# Patient Record
Sex: Female | Born: 1935 | Race: White | Hispanic: No | Smoking: Former smoker
Health system: Southern US, Community
[De-identification: ages and names within clinical notes are randomized; demographics above are authoritative.]

## PROBLEM LIST (undated history)

## (undated) DIAGNOSIS — I4891 Unspecified atrial fibrillation: Secondary | ICD-10-CM

## (undated) DIAGNOSIS — I6350 Cerebral infarction due to unspecified occlusion or stenosis of unspecified cerebral artery: Secondary | ICD-10-CM

## (undated) DIAGNOSIS — Z8719 Personal history of other diseases of the digestive system: Secondary | ICD-10-CM

## (undated) DIAGNOSIS — T4145XA Adverse effect of unspecified anesthetic, initial encounter: Secondary | ICD-10-CM

## (undated) DIAGNOSIS — F22 Delusional disorders: Secondary | ICD-10-CM

## (undated) DIAGNOSIS — M5137 Other intervertebral disc degeneration, lumbosacral region: Secondary | ICD-10-CM

## (undated) DIAGNOSIS — IMO0002 Reserved for concepts with insufficient information to code with codable children: Secondary | ICD-10-CM

## (undated) DIAGNOSIS — R0609 Other forms of dyspnea: Secondary | ICD-10-CM

## (undated) DIAGNOSIS — M51379 Other intervertebral disc degeneration, lumbosacral region without mention of lumbar back pain or lower extremity pain: Secondary | ICD-10-CM

## (undated) DIAGNOSIS — K921 Melena: Secondary | ICD-10-CM

## (undated) DIAGNOSIS — J984 Other disorders of lung: Secondary | ICD-10-CM

## (undated) DIAGNOSIS — F411 Generalized anxiety disorder: Secondary | ICD-10-CM

## (undated) DIAGNOSIS — R0989 Other specified symptoms and signs involving the circulatory and respiratory systems: Secondary | ICD-10-CM

## (undated) DIAGNOSIS — I10 Essential (primary) hypertension: Secondary | ICD-10-CM

## (undated) DIAGNOSIS — Z8711 Personal history of peptic ulcer disease: Secondary | ICD-10-CM

## (undated) DIAGNOSIS — K219 Gastro-esophageal reflux disease without esophagitis: Secondary | ICD-10-CM

## (undated) DIAGNOSIS — T7840XA Allergy, unspecified, initial encounter: Secondary | ICD-10-CM

## (undated) DIAGNOSIS — T8859XA Other complications of anesthesia, initial encounter: Secondary | ICD-10-CM

## (undated) DIAGNOSIS — D649 Anemia, unspecified: Secondary | ICD-10-CM

## (undated) DIAGNOSIS — K922 Gastrointestinal hemorrhage, unspecified: Secondary | ICD-10-CM

## (undated) DIAGNOSIS — T50995A Adverse effect of other drugs, medicaments and biological substances, initial encounter: Secondary | ICD-10-CM

## (undated) DIAGNOSIS — M1710 Unilateral primary osteoarthritis, unspecified knee: Secondary | ICD-10-CM

## (undated) HISTORY — DX: Personal history of other diseases of the digestive system: Z87.19

## (undated) HISTORY — DX: Cerebral infarction due to unspecified occlusion or stenosis of unspecified cerebral artery: I63.50

## (undated) HISTORY — DX: Generalized anxiety disorder: F41.1

## (undated) HISTORY — DX: Melena: K92.1

## (undated) HISTORY — DX: Other forms of dyspnea: R06.09

## (undated) HISTORY — DX: Unilateral primary osteoarthritis, unspecified knee: M17.10

## (undated) HISTORY — DX: Delusional disorders: F22

## (undated) HISTORY — DX: Essential (primary) hypertension: I10

## (undated) HISTORY — DX: Allergy, unspecified, initial encounter: T78.40XA

## (undated) HISTORY — PX: SHOULDER ARTHROSCOPY: SHX128

## (undated) HISTORY — DX: Reserved for concepts with insufficient information to code with codable children: IMO0002

## (undated) HISTORY — DX: Gastrointestinal hemorrhage, unspecified: K92.2

## (undated) HISTORY — PX: DILATION AND CURETTAGE OF UTERUS: SHX78

## (undated) HISTORY — DX: Other intervertebral disc degeneration, lumbosacral region without mention of lumbar back pain or lower extremity pain: M51.379

## (undated) HISTORY — DX: Other intervertebral disc degeneration, lumbosacral region: M51.37

## (undated) HISTORY — DX: Other disorders of lung: J98.4

## (undated) HISTORY — DX: Adverse effect of other drugs, medicaments and biological substances, initial encounter: T50.995A

## (undated) HISTORY — DX: Other specified symptoms and signs involving the circulatory and respiratory systems: R09.89

## (undated) HISTORY — DX: Anemia, unspecified: D64.9

## (undated) HISTORY — DX: Personal history of peptic ulcer disease: Z87.11

---
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
## 1998-07-27 ENCOUNTER — Ambulatory Visit (HOSPITAL_COMMUNITY): Admission: RE | Admit: 1998-07-27 | Discharge: 1998-07-27 | Payer: Self-pay | Admitting: Internal Medicine

## 1998-07-27 ENCOUNTER — Encounter: Payer: Self-pay | Admitting: Internal Medicine

## 1999-01-12 ENCOUNTER — Emergency Department (HOSPITAL_COMMUNITY): Admission: EM | Admit: 1999-01-12 | Discharge: 1999-01-12 | Payer: Self-pay

## 2000-01-11 ENCOUNTER — Ambulatory Visit (HOSPITAL_COMMUNITY): Admission: RE | Admit: 2000-01-11 | Discharge: 2000-01-11 | Payer: Self-pay | Admitting: *Deleted

## 2000-02-14 ENCOUNTER — Ambulatory Visit (HOSPITAL_COMMUNITY): Admission: RE | Admit: 2000-02-14 | Discharge: 2000-02-14 | Payer: Self-pay | Admitting: Orthopaedic Surgery

## 2000-03-28 ENCOUNTER — Inpatient Hospital Stay (HOSPITAL_COMMUNITY): Admission: RE | Admit: 2000-03-28 | Discharge: 2000-03-31 | Payer: Self-pay | Admitting: Internal Medicine

## 2000-03-28 ENCOUNTER — Encounter (INDEPENDENT_AMBULATORY_CARE_PROVIDER_SITE_OTHER): Payer: Self-pay | Admitting: *Deleted

## 2000-03-28 ENCOUNTER — Encounter: Payer: Self-pay | Admitting: Internal Medicine

## 2000-12-18 ENCOUNTER — Other Ambulatory Visit: Admission: RE | Admit: 2000-12-18 | Discharge: 2000-12-18 | Payer: Self-pay | Admitting: Obstetrics and Gynecology

## 2004-12-19 ENCOUNTER — Inpatient Hospital Stay (HOSPITAL_COMMUNITY): Admission: AC | Admit: 2004-12-19 | Discharge: 2004-12-22 | Payer: Self-pay | Admitting: Internal Medicine

## 2004-12-19 ENCOUNTER — Ambulatory Visit: Payer: Self-pay | Admitting: Internal Medicine

## 2004-12-21 LAB — HM COLONOSCOPY

## 2005-01-11 ENCOUNTER — Ambulatory Visit: Payer: Self-pay | Admitting: Internal Medicine

## 2005-03-01 ENCOUNTER — Encounter: Admission: RE | Admit: 2005-03-01 | Discharge: 2005-03-01 | Payer: Self-pay | Admitting: Internal Medicine

## 2005-03-03 ENCOUNTER — Ambulatory Visit: Payer: Self-pay | Admitting: Internal Medicine

## 2005-06-20 ENCOUNTER — Ambulatory Visit: Payer: Self-pay | Admitting: Internal Medicine

## 2005-12-08 ENCOUNTER — Ambulatory Visit: Payer: Self-pay | Admitting: Internal Medicine

## 2006-03-02 ENCOUNTER — Encounter (INDEPENDENT_AMBULATORY_CARE_PROVIDER_SITE_OTHER): Payer: Self-pay | Admitting: *Deleted

## 2006-07-05 ENCOUNTER — Ambulatory Visit: Payer: Self-pay | Admitting: Internal Medicine

## 2006-07-05 LAB — CONVERTED CEMR LAB
HCT: 45.6 % (ref 36.0–46.0)
Hemoglobin: 15.5 g/dL — ABNORMAL HIGH (ref 12.0–15.0)

## 2006-08-30 ENCOUNTER — Ambulatory Visit: Payer: Self-pay | Admitting: Internal Medicine

## 2006-10-12 ENCOUNTER — Ambulatory Visit: Payer: Self-pay | Admitting: Internal Medicine

## 2006-10-19 ENCOUNTER — Ambulatory Visit: Payer: Self-pay | Admitting: Internal Medicine

## 2006-10-24 ENCOUNTER — Ambulatory Visit: Payer: Self-pay | Admitting: Internal Medicine

## 2006-11-01 ENCOUNTER — Ambulatory Visit: Payer: Self-pay | Admitting: Internal Medicine

## 2006-11-22 ENCOUNTER — Ambulatory Visit: Payer: Self-pay | Admitting: Internal Medicine

## 2007-01-23 ENCOUNTER — Encounter: Payer: Self-pay | Admitting: *Deleted

## 2007-01-23 DIAGNOSIS — Z8711 Personal history of peptic ulcer disease: Secondary | ICD-10-CM

## 2007-01-23 DIAGNOSIS — M5137 Other intervertebral disc degeneration, lumbosacral region: Secondary | ICD-10-CM

## 2007-01-23 DIAGNOSIS — K922 Gastrointestinal hemorrhage, unspecified: Secondary | ICD-10-CM | POA: Insufficient documentation

## 2007-01-23 DIAGNOSIS — M1990 Unspecified osteoarthritis, unspecified site: Secondary | ICD-10-CM

## 2007-01-23 DIAGNOSIS — Z8719 Personal history of other diseases of the digestive system: Secondary | ICD-10-CM

## 2007-01-23 DIAGNOSIS — F419 Anxiety disorder, unspecified: Secondary | ICD-10-CM

## 2007-01-23 DIAGNOSIS — I110 Hypertensive heart disease with heart failure: Secondary | ICD-10-CM

## 2007-01-25 ENCOUNTER — Encounter: Payer: Self-pay | Admitting: Internal Medicine

## 2007-01-25 ENCOUNTER — Ambulatory Visit: Payer: Self-pay | Admitting: Internal Medicine

## 2007-05-04 ENCOUNTER — Telehealth: Payer: Self-pay | Admitting: Internal Medicine

## 2007-06-01 ENCOUNTER — Ambulatory Visit: Payer: Self-pay | Admitting: Internal Medicine

## 2007-06-01 DIAGNOSIS — F22 Delusional disorders: Secondary | ICD-10-CM | POA: Insufficient documentation

## 2007-06-18 ENCOUNTER — Telehealth: Payer: Self-pay | Admitting: Internal Medicine

## 2007-07-18 ENCOUNTER — Ambulatory Visit: Payer: Self-pay | Admitting: Internal Medicine

## 2007-07-18 DIAGNOSIS — K921 Melena: Secondary | ICD-10-CM | POA: Insufficient documentation

## 2007-07-18 LAB — CONVERTED CEMR LAB
Basophils Absolute: 0.1 10*3/uL (ref 0.0–0.1)
Eosinophils Absolute: 0 10*3/uL (ref 0.0–0.6)
Hemoglobin: 14.4 g/dL (ref 12.0–15.0)
Lymphocytes Relative: 28.4 % (ref 12.0–46.0)
MCHC: 33.5 g/dL (ref 30.0–36.0)
Monocytes Absolute: 0.5 10*3/uL (ref 0.2–0.7)
Monocytes Relative: 7.7 % (ref 3.0–11.0)
Neutro Abs: 4.3 10*3/uL (ref 1.4–7.7)
Platelets: 222 10*3/uL (ref 150–400)

## 2007-07-19 ENCOUNTER — Telehealth: Payer: Self-pay | Admitting: Internal Medicine

## 2007-08-16 ENCOUNTER — Encounter: Payer: Self-pay | Admitting: Internal Medicine

## 2007-10-04 ENCOUNTER — Telehealth: Payer: Self-pay | Admitting: Internal Medicine

## 2007-11-05 ENCOUNTER — Telehealth: Payer: Self-pay | Admitting: Internal Medicine

## 2007-11-09 ENCOUNTER — Ambulatory Visit: Payer: Self-pay | Admitting: Internal Medicine

## 2007-11-09 DIAGNOSIS — T50995A Adverse effect of other drugs, medicaments and biological substances, initial encounter: Secondary | ICD-10-CM

## 2007-11-29 ENCOUNTER — Telehealth: Payer: Self-pay | Admitting: Internal Medicine

## 2007-11-30 ENCOUNTER — Telehealth: Payer: Self-pay | Admitting: Internal Medicine

## 2007-12-07 ENCOUNTER — Telehealth: Payer: Self-pay | Admitting: Internal Medicine

## 2007-12-18 ENCOUNTER — Telehealth: Payer: Self-pay | Admitting: Internal Medicine

## 2007-12-21 ENCOUNTER — Telehealth: Payer: Self-pay | Admitting: Internal Medicine

## 2008-01-02 ENCOUNTER — Telehealth: Payer: Self-pay | Admitting: Internal Medicine

## 2008-01-08 ENCOUNTER — Ambulatory Visit: Payer: Self-pay | Admitting: Internal Medicine

## 2008-01-08 ENCOUNTER — Telehealth: Payer: Self-pay | Admitting: Internal Medicine

## 2008-01-08 DIAGNOSIS — D649 Anemia, unspecified: Secondary | ICD-10-CM

## 2008-01-08 LAB — CONVERTED CEMR LAB
Eosinophils Absolute: 0 10*3/uL (ref 0.0–0.7)
Eosinophils Relative: 0.3 % (ref 0.0–5.0)
Lymphocytes Relative: 14 % (ref 12.0–46.0)
Monocytes Relative: 7.4 % (ref 3.0–12.0)
Neutrophils Relative %: 77.8 % — ABNORMAL HIGH (ref 43.0–77.0)
Platelets: 255 10*3/uL (ref 150–400)
RBC: 4.21 M/uL (ref 3.87–5.11)
WBC: 10.3 10*3/uL (ref 4.5–10.5)

## 2008-01-11 ENCOUNTER — Telehealth: Payer: Self-pay | Admitting: Internal Medicine

## 2008-02-22 ENCOUNTER — Encounter: Payer: Self-pay | Admitting: Internal Medicine

## 2008-02-22 LAB — HM MAMMOGRAPHY: HM Mammogram: NORMAL

## 2008-02-27 ENCOUNTER — Telehealth: Payer: Self-pay | Admitting: Internal Medicine

## 2008-05-26 ENCOUNTER — Ambulatory Visit: Payer: Self-pay | Admitting: Internal Medicine

## 2008-05-26 DIAGNOSIS — J984 Other disorders of lung: Secondary | ICD-10-CM | POA: Insufficient documentation

## 2008-05-26 LAB — CONVERTED CEMR LAB
BUN: 22 mg/dL (ref 6–23)
Basophils Absolute: 0 10*3/uL (ref 0.0–0.1)
Chloride: 105 meq/L (ref 96–112)
Direct LDL: 159 mg/dL
Eosinophils Absolute: 0 10*3/uL (ref 0.0–0.7)
Eosinophils Relative: 0.5 % (ref 0.0–5.0)
GFR calc non Af Amer: 74 mL/min
HDL: 57.5 mg/dL (ref >39.0)
Lymphocytes Relative: 29.7 % (ref 12.0–46.0)
MCV: 95 fL (ref 78.0–100.0)
Neutrophils Relative %: 62.9 % (ref 43.0–77.0)
Platelets: 189 10*3/uL (ref 150–400)
Potassium: 5 meq/L (ref 3.5–5.1)
Triglycerides: 96 mg/dL (ref 0–149)
VLDL: 19 mg/dL (ref 0–40)
WBC: 6.7 10*3/uL (ref 4.5–10.5)

## 2008-05-27 ENCOUNTER — Encounter: Payer: Self-pay | Admitting: Internal Medicine

## 2008-06-03 ENCOUNTER — Telehealth: Payer: Self-pay | Admitting: Internal Medicine

## 2008-06-12 ENCOUNTER — Telehealth: Payer: Self-pay | Admitting: Internal Medicine

## 2008-06-30 ENCOUNTER — Telehealth: Payer: Self-pay | Admitting: Internal Medicine

## 2008-07-03 ENCOUNTER — Telehealth (INDEPENDENT_AMBULATORY_CARE_PROVIDER_SITE_OTHER): Payer: Self-pay | Admitting: *Deleted

## 2008-07-07 ENCOUNTER — Ambulatory Visit: Payer: Self-pay | Admitting: Internal Medicine

## 2008-07-07 LAB — CONVERTED CEMR LAB
ALT: 18 units/L (ref 0–35)
AST: 20 units/L (ref 0–37)
Albumin: 4.2 g/dL (ref 3.5–5.2)
HDL: 49.2 mg/dL (ref >39.0)
Triglycerides: 98 mg/dL (ref 0–149)

## 2008-07-10 ENCOUNTER — Telehealth: Payer: Self-pay | Admitting: Internal Medicine

## 2008-08-06 ENCOUNTER — Telehealth: Payer: Self-pay | Admitting: Internal Medicine

## 2008-09-03 ENCOUNTER — Telehealth: Payer: Self-pay | Admitting: Internal Medicine

## 2008-09-03 DIAGNOSIS — IMO0002 Reserved for concepts with insufficient information to code with codable children: Secondary | ICD-10-CM | POA: Insufficient documentation

## 2008-09-03 DIAGNOSIS — M1710 Unilateral primary osteoarthritis, unspecified knee: Secondary | ICD-10-CM

## 2008-09-04 ENCOUNTER — Encounter (INDEPENDENT_AMBULATORY_CARE_PROVIDER_SITE_OTHER): Payer: Self-pay | Admitting: *Deleted

## 2008-09-23 ENCOUNTER — Encounter: Payer: Self-pay | Admitting: Internal Medicine

## 2008-10-03 ENCOUNTER — Ambulatory Visit: Payer: Self-pay | Admitting: Internal Medicine

## 2008-10-03 DIAGNOSIS — R0609 Other forms of dyspnea: Secondary | ICD-10-CM

## 2008-10-03 DIAGNOSIS — R0989 Other specified symptoms and signs involving the circulatory and respiratory systems: Secondary | ICD-10-CM

## 2008-10-03 LAB — CONVERTED CEMR LAB
Calcium: 9.7 mg/dL (ref 8.4–10.5)
GFR calc non Af Amer: 73.69 mL/min (ref >60)
Sodium: 144 meq/L (ref 135–145)

## 2008-10-06 ENCOUNTER — Telehealth: Payer: Self-pay | Admitting: Internal Medicine

## 2008-10-09 ENCOUNTER — Ambulatory Visit: Payer: Self-pay | Admitting: Cardiology

## 2008-10-16 ENCOUNTER — Telehealth: Payer: Self-pay | Admitting: Internal Medicine

## 2008-10-23 ENCOUNTER — Telehealth: Payer: Self-pay | Admitting: Internal Medicine

## 2008-10-27 ENCOUNTER — Ambulatory Visit: Payer: Self-pay | Admitting: Internal Medicine

## 2008-10-27 DIAGNOSIS — T7840XA Allergy, unspecified, initial encounter: Secondary | ICD-10-CM | POA: Insufficient documentation

## 2008-10-30 DIAGNOSIS — I6350 Cerebral infarction due to unspecified occlusion or stenosis of unspecified cerebral artery: Secondary | ICD-10-CM

## 2008-10-30 HISTORY — DX: Cerebral infarction due to unspecified occlusion or stenosis of unspecified cerebral artery: I63.50

## 2008-11-06 ENCOUNTER — Telehealth: Payer: Self-pay | Admitting: Internal Medicine

## 2008-11-09 ENCOUNTER — Ambulatory Visit: Payer: Self-pay | Admitting: Internal Medicine

## 2008-11-10 ENCOUNTER — Encounter (INDEPENDENT_AMBULATORY_CARE_PROVIDER_SITE_OTHER): Payer: Self-pay | Admitting: Internal Medicine

## 2008-11-10 ENCOUNTER — Inpatient Hospital Stay (HOSPITAL_COMMUNITY): Admission: EM | Admit: 2008-11-10 | Discharge: 2008-11-12 | Payer: Self-pay | Admitting: Emergency Medicine

## 2008-11-10 ENCOUNTER — Ambulatory Visit: Payer: Self-pay | Admitting: *Deleted

## 2008-11-17 ENCOUNTER — Observation Stay (HOSPITAL_COMMUNITY): Admission: EM | Admit: 2008-11-17 | Discharge: 2008-11-18 | Payer: Self-pay | Admitting: Emergency Medicine

## 2008-11-17 ENCOUNTER — Ambulatory Visit: Payer: Self-pay | Admitting: Internal Medicine

## 2008-11-17 DIAGNOSIS — I639 Cerebral infarction, unspecified: Secondary | ICD-10-CM | POA: Insufficient documentation

## 2008-11-19 ENCOUNTER — Telehealth: Payer: Self-pay | Admitting: Internal Medicine

## 2008-11-21 ENCOUNTER — Ambulatory Visit: Payer: Self-pay | Admitting: Internal Medicine

## 2008-11-21 LAB — CONVERTED CEMR LAB: POC INR: 17

## 2008-11-25 ENCOUNTER — Ambulatory Visit: Payer: Self-pay | Admitting: Internal Medicine

## 2008-11-25 ENCOUNTER — Ambulatory Visit: Payer: Self-pay | Admitting: Cardiology

## 2008-12-02 ENCOUNTER — Telehealth: Payer: Self-pay | Admitting: Internal Medicine

## 2008-12-02 ENCOUNTER — Ambulatory Visit: Payer: Self-pay | Admitting: Cardiology

## 2008-12-03 ENCOUNTER — Telehealth: Payer: Self-pay | Admitting: Internal Medicine

## 2008-12-09 ENCOUNTER — Ambulatory Visit: Payer: Self-pay | Admitting: Internal Medicine

## 2008-12-16 ENCOUNTER — Ambulatory Visit: Payer: Self-pay | Admitting: Cardiology

## 2008-12-16 LAB — CONVERTED CEMR LAB: POC INR: 2.7

## 2008-12-26 ENCOUNTER — Ambulatory Visit: Payer: Self-pay | Admitting: Cardiovascular Disease

## 2008-12-26 LAB — CONVERTED CEMR LAB: POC INR: 2.7

## 2009-01-01 ENCOUNTER — Ambulatory Visit: Payer: Self-pay | Admitting: Internal Medicine

## 2009-01-01 DIAGNOSIS — R0602 Shortness of breath: Secondary | ICD-10-CM

## 2009-01-07 ENCOUNTER — Telehealth: Payer: Self-pay | Admitting: Internal Medicine

## 2009-01-09 ENCOUNTER — Ambulatory Visit: Payer: Self-pay | Admitting: Internal Medicine

## 2009-01-09 LAB — CONVERTED CEMR LAB: POC INR: 2.4

## 2009-01-19 ENCOUNTER — Ambulatory Visit: Payer: Self-pay | Admitting: Internal Medicine

## 2009-01-28 ENCOUNTER — Ambulatory Visit: Payer: Self-pay | Admitting: Internal Medicine

## 2009-01-28 DIAGNOSIS — R351 Nocturia: Secondary | ICD-10-CM

## 2009-01-28 DIAGNOSIS — L989 Disorder of the skin and subcutaneous tissue, unspecified: Secondary | ICD-10-CM | POA: Insufficient documentation

## 2009-01-28 LAB — CONVERTED CEMR LAB
Eosinophils Relative: 0.8 % (ref 0.0–5.0)
GFR calc non Af Amer: 73.62 mL/min (ref >60)
Glucose, Bld: 104 mg/dL — ABNORMAL HIGH (ref 70–99)
MCV: 98.6 fL (ref 78.0–100.0)
Monocytes Absolute: 0.5 10*3/uL (ref 0.1–1.0)
Monocytes Relative: 6.5 % (ref 3.0–12.0)
Neutrophils Relative %: 70.6 % (ref 43.0–77.0)
Platelets: 214 10*3/uL (ref 150.0–400.0)
Potassium: 4.9 meq/L (ref 3.5–5.1)
Sodium: 140 meq/L (ref 135–145)
WBC: 7 10*3/uL (ref 4.5–10.5)

## 2009-02-02 ENCOUNTER — Ambulatory Visit: Payer: Self-pay | Admitting: Internal Medicine

## 2009-02-06 ENCOUNTER — Ambulatory Visit: Payer: Self-pay | Admitting: Internal Medicine

## 2009-02-06 LAB — CONVERTED CEMR LAB: POC INR: 3.5

## 2009-02-16 ENCOUNTER — Telehealth: Payer: Self-pay | Admitting: Internal Medicine

## 2009-02-19 ENCOUNTER — Telehealth: Payer: Self-pay | Admitting: Internal Medicine

## 2009-02-27 ENCOUNTER — Ambulatory Visit: Payer: Self-pay | Admitting: Internal Medicine

## 2009-02-27 LAB — CONVERTED CEMR LAB: POC INR: 3.9

## 2009-03-13 ENCOUNTER — Ambulatory Visit: Payer: Self-pay | Admitting: Cardiology

## 2009-03-13 LAB — CONVERTED CEMR LAB: POC INR: 3.3

## 2009-03-25 ENCOUNTER — Telehealth (INDEPENDENT_AMBULATORY_CARE_PROVIDER_SITE_OTHER): Payer: Self-pay | Admitting: *Deleted

## 2009-03-27 ENCOUNTER — Ambulatory Visit: Payer: Self-pay | Admitting: Internal Medicine

## 2009-03-27 LAB — CONVERTED CEMR LAB: POC INR: 3

## 2009-04-07 ENCOUNTER — Encounter: Payer: Self-pay | Admitting: Internal Medicine

## 2009-04-08 ENCOUNTER — Telehealth: Payer: Self-pay | Admitting: Internal Medicine

## 2009-04-09 ENCOUNTER — Telehealth: Payer: Self-pay | Admitting: Internal Medicine

## 2009-04-17 ENCOUNTER — Ambulatory Visit: Payer: Self-pay | Admitting: Cardiovascular Disease

## 2009-04-27 ENCOUNTER — Telehealth: Payer: Self-pay | Admitting: Internal Medicine

## 2009-04-30 ENCOUNTER — Ambulatory Visit: Payer: Self-pay | Admitting: Internal Medicine

## 2009-05-04 ENCOUNTER — Ambulatory Visit: Payer: Self-pay | Admitting: Internal Medicine

## 2009-05-04 DIAGNOSIS — R209 Unspecified disturbances of skin sensation: Secondary | ICD-10-CM | POA: Insufficient documentation

## 2009-05-04 LAB — CONVERTED CEMR LAB
Basophils Absolute: 0 10*3/uL (ref 0.0–0.1)
Calcium: 9.9 mg/dL (ref 8.4–10.5)
Eosinophils Absolute: 0 10*3/uL (ref 0.0–0.7)
GFR calc non Af Amer: 85.83 mL/min (ref >60)
Iron: 79 ug/dL (ref 42–145)
MCHC: 34.3 g/dL (ref 30.0–36.0)
MCV: 96.9 fL (ref 78.0–100.0)
Monocytes Absolute: 0.4 10*3/uL (ref 0.1–1.0)
Neutrophils Relative %: 72.7 % (ref 43.0–77.0)
Platelets: 219 10*3/uL (ref 150.0–400.0)
Potassium: 4.6 meq/L (ref 3.5–5.1)
RDW: 13.6 % (ref 11.5–14.6)
Sodium: 138 meq/L (ref 135–145)
TSH: 1.1 microintl units/mL (ref 0.35–5.50)
Vitamin B-12: 654 pg/mL (ref 211–911)
WBC: 6.4 10*3/uL (ref 4.5–10.5)

## 2009-05-07 ENCOUNTER — Telehealth: Payer: Self-pay | Admitting: Internal Medicine

## 2009-05-14 ENCOUNTER — Telehealth: Payer: Self-pay | Admitting: Internal Medicine

## 2009-05-18 ENCOUNTER — Telehealth (INDEPENDENT_AMBULATORY_CARE_PROVIDER_SITE_OTHER): Payer: Self-pay | Admitting: *Deleted

## 2009-05-22 ENCOUNTER — Ambulatory Visit: Payer: Self-pay | Admitting: Cardiology

## 2009-06-02 ENCOUNTER — Telehealth: Payer: Self-pay | Admitting: Internal Medicine

## 2009-06-05 ENCOUNTER — Telehealth: Payer: Self-pay | Admitting: Internal Medicine

## 2009-06-11 ENCOUNTER — Ambulatory Visit: Payer: Self-pay | Admitting: Cardiology

## 2009-06-11 ENCOUNTER — Telehealth: Payer: Self-pay | Admitting: Internal Medicine

## 2009-06-15 ENCOUNTER — Ambulatory Visit: Payer: Self-pay | Admitting: Internal Medicine

## 2009-06-22 ENCOUNTER — Telehealth: Payer: Self-pay | Admitting: Internal Medicine

## 2009-07-27 ENCOUNTER — Telehealth: Payer: Self-pay | Admitting: Internal Medicine

## 2009-07-29 ENCOUNTER — Ambulatory Visit: Payer: Self-pay | Admitting: Internal Medicine

## 2009-07-31 ENCOUNTER — Telehealth: Payer: Self-pay | Admitting: Internal Medicine

## 2009-08-04 ENCOUNTER — Ambulatory Visit: Payer: Self-pay | Admitting: Internal Medicine

## 2009-08-04 ENCOUNTER — Telehealth: Payer: Self-pay | Admitting: Internal Medicine

## 2009-09-10 ENCOUNTER — Ambulatory Visit: Payer: Self-pay | Admitting: Internal Medicine

## 2009-09-18 ENCOUNTER — Encounter: Payer: Self-pay | Admitting: Internal Medicine

## 2009-09-25 ENCOUNTER — Telehealth: Payer: Self-pay | Admitting: Internal Medicine

## 2009-10-06 ENCOUNTER — Telehealth: Payer: Self-pay | Admitting: Internal Medicine

## 2009-10-27 ENCOUNTER — Telehealth: Payer: Self-pay | Admitting: Internal Medicine

## 2009-12-21 ENCOUNTER — Telehealth: Payer: Self-pay | Admitting: Internal Medicine

## 2009-12-21 ENCOUNTER — Ambulatory Visit: Payer: Self-pay | Admitting: Internal Medicine

## 2010-01-05 ENCOUNTER — Telehealth: Payer: Self-pay | Admitting: Internal Medicine

## 2010-01-21 ENCOUNTER — Ambulatory Visit: Payer: Self-pay | Admitting: Internal Medicine

## 2010-01-21 DIAGNOSIS — K439 Ventral hernia without obstruction or gangrene: Secondary | ICD-10-CM | POA: Insufficient documentation

## 2010-02-03 ENCOUNTER — Telehealth: Payer: Self-pay | Admitting: Internal Medicine

## 2010-02-08 ENCOUNTER — Telehealth: Payer: Self-pay | Admitting: Internal Medicine

## 2010-02-17 ENCOUNTER — Telehealth: Payer: Self-pay | Admitting: Internal Medicine

## 2010-03-12 ENCOUNTER — Telehealth: Payer: Self-pay | Admitting: Internal Medicine

## 2010-03-24 ENCOUNTER — Telehealth: Payer: Self-pay | Admitting: Internal Medicine

## 2010-03-29 ENCOUNTER — Telehealth: Payer: Self-pay | Admitting: Internal Medicine

## 2010-04-01 ENCOUNTER — Encounter: Payer: Self-pay | Admitting: Internal Medicine

## 2010-04-12 ENCOUNTER — Telehealth: Payer: Self-pay | Admitting: Internal Medicine

## 2010-05-06 ENCOUNTER — Other Ambulatory Visit: Payer: Self-pay | Admitting: Internal Medicine

## 2010-05-06 ENCOUNTER — Ambulatory Visit
Admission: RE | Admit: 2010-05-06 | Discharge: 2010-05-06 | Payer: Self-pay | Source: Home / Self Care | Attending: Internal Medicine | Admitting: Internal Medicine

## 2010-05-06 LAB — CBC WITH DIFFERENTIAL/PLATELET
Basophils Absolute: 0 10*3/uL (ref 0.0–0.1)
Basophils Relative: 0.4 % (ref 0.0–3.0)
Eosinophils Absolute: 0 10*3/uL (ref 0.0–0.7)
Eosinophils Relative: 0.7 % (ref 0.0–5.0)
HCT: 42.2 % (ref 36.0–46.0)
Hemoglobin: 14.6 g/dL (ref 12.0–15.0)
Lymphocytes Relative: 24.3 % (ref 12.0–46.0)
Lymphs Abs: 1.3 10*3/uL (ref 0.7–4.0)
MCHC: 34.6 g/dL (ref 30.0–36.0)
MCV: 96.6 fl (ref 78.0–100.0)
Monocytes Absolute: 0.4 10*3/uL (ref 0.1–1.0)
Monocytes Relative: 7.8 % (ref 3.0–12.0)
Neutro Abs: 3.7 10*3/uL (ref 1.4–7.7)
Neutrophils Relative %: 66.8 % (ref 43.0–77.0)
Platelets: 233 10*3/uL (ref 150.0–400.0)
RBC: 4.37 Mil/uL (ref 3.87–5.11)
RDW: 13.6 % (ref 11.5–14.6)
WBC: 5.5 10*3/uL (ref 4.5–10.5)

## 2010-05-06 LAB — IBC PANEL
Iron: 87 ug/dL (ref 42–145)
Saturation Ratios: 18.1 % — ABNORMAL LOW (ref 20.0–50.0)
Transferrin: 343.9 mg/dL (ref 212.0–360.0)

## 2010-05-06 LAB — BASIC METABOLIC PANEL
BUN: 12 mg/dL (ref 6–23)
CO2: 30 mEq/L (ref 19–32)
Calcium: 9.5 mg/dL (ref 8.4–10.5)
Chloride: 96 mEq/L (ref 96–112)
Creatinine, Ser: 0.6 mg/dL (ref 0.4–1.2)
GFR: 102.28 mL/min (ref >60.00)
Glucose, Bld: 92 mg/dL (ref 70–99)
Potassium: 4.4 mEq/L (ref 3.5–5.1)
Sodium: 134 mEq/L — ABNORMAL LOW (ref 135–145)

## 2010-05-06 LAB — B12 AND FOLATE PANEL
Folate: >20 ng/mL
Vitamin B-12: 607 pg/mL (ref 211–911)

## 2010-05-06 LAB — TSH: TSH: 2.02 u[IU]/mL (ref 0.35–5.50)

## 2010-05-07 ENCOUNTER — Encounter: Payer: Self-pay | Admitting: Internal Medicine

## 2010-06-01 NOTE — Progress Notes (Signed)
  Phone Note Refill Request Message from:  Fax from Pharmacy on February 08, 2010 2:02 PM  Refills Requested: Medication #1:  TRAMADOL HCL 50 MG TABS 1 by mouth q 6 prn Please Advise refill for pt, thank you  Initial call taken by: Ami Bullins CMA,  February 08, 2010 2:03 PM  Follow-up for Phone Call        OK for refill x 5  Follow-up by: [name],  February 08, 2010 2:13 PM    Prescriptions: TRAMADOL HCL 50 MG TABS (TRAMADOL HCL) 1 by mouth q 6 prn  #100 x 5   Entered by:   Ami Bullins CMA   Authorized by:   [name]   Signed by:   Bill Salinas CMA on 02/08/2010   Method used:   Electronically to        [facility] (retail)       [address]       [place], [place]  [address]       Ph: [phone]       Fax: [phone]   RxID:   [id number]

## 2010-06-01 NOTE — Progress Notes (Signed)
  Phone Note Refill Request Message from:  Fax from Pharmacy on May 18, 2009 4:54 PM  Refills Requested: Medication #1:  OMEPRAZOLE 20 MG  CPDR 2 once daily  Medication #2:  HYDROCODONE-ACETAMINOPHEN 7.5-750 MG TABS 1/2 tab every 3 hours as needed limit of 3 full doses/day (6 1/2 doses).  Follow-up for Phone Call        [facility] Follow-up by: Ami Bullins CMA,  May 19, 2009 9:49 AM

## 2010-06-01 NOTE — Progress Notes (Signed)
Summary: REFERRAL?   Phone Note Call from Patient   Summary of Call: Pt has had to take tramadol for "stomach" discomfort since taking coumadin. She wants to know if she should see Dr Debby Bud or be referred?  Initial call taken by: Lamar Sprinkles, CMA,  June 11, 2009 4:38 PM  Follow-up for Phone Call        Needs OV before knowing to whom to refer. Follow-up by: [name],  June 11, 2009 4:47 PM  Additional Follow-up for Phone Call Additional follow up Details #1::        Pt scheduled for monday Additional Follow-up by: Lamar Sprinkles, CMA,  June 12, 2009 10:51 AM

## 2010-06-01 NOTE — Medication Information (Signed)
Summary: rov/tm  Anticoagulant Therapy  Managed by: Bethena Midget, RN, BSN PCP: Link Snuffer MD: Daleen Squibb MD, Maisie Fus Indication 1: Cerebrovascular Accident Lab Used: [facility] Site: [address] INR POC 1.8 INR RANGE 2.0-3.0  Dietary changes: no    Health status changes: no    Bleeding/hemorrhagic complications: yes       Details: Occ. scant amt of nose bleeding  Recent/future hospitalizations: no    Any changes in medication regimen? no    Recent/future dental: no  Any missed doses?: no       Is patient compliant with meds? yes       Allergies: 1)  ! Penicillin 2)  ! Ultram 3)  ! Asa 4)  ! Metoclopramide Hcl (Metoclopramide Hcl)  Anticoagulation Management History:      The patient is taking warfarin and comes in today for a routine follow up visit.  Positive risk factors for bleeding include an age of 75 years or older, history of CVA/TIA, and history of GI bleeding.  The bleeding index is 'high risk'.  Positive CHADS2 values include History of HTN, Age > 38 years old, and Prior Stroke/CVA/TIA.  Anticoagulation responsible provider: Daleen Squibb MD, Maisie Fus.  INR POC: 1.8.  Cuvette Lot#: [id number].  Exp: 06/2010.    Anticoagulation Management Assessment/Plan:      The patient's current anticoagulation dose is Warfarin sodium 5 mg tabs: Take as directed by coumadin clinic..  The target INR is 2.0-3.0.  The next INR is due 06/25/2009.  Anticoagulation instructions were given to patient.  Results were reviewed/authorized by Bethena Midget, RN, BSN.  She was notified by Bethena Midget, RN, BSN.         Prior Anticoagulation Instructions: INR 3.2 Today skip coumadin dose, then change dose to 1/2 tablet daily= 2.5mg s daily.   Current Anticoagulation Instructions: INR 1.8 Today take 1 tablet then resume 1/2 tablet everyday. Recheck in 2 weeks.

## 2010-06-01 NOTE — Progress Notes (Signed)
Summary: OV TODAY  Phone Note Call from Patient   Summary of Call: Pt is now taking nifedipine in the am and takes metoprolol in the pm.  BP readings yesterday and this am: 150's/80's 184/100 p79 153/97  Pt also c/o left leg swelling. Ankle was swollen yesterday and red, now this am it is purple. Also has swelling up to knee, some redness and warmth to area. Scheduled for apt this afternoon for eval.    Initial call taken by: Lamar Sprinkles, CMA,  August 04, 2009 8:46 AM

## 2010-06-01 NOTE — Progress Notes (Signed)
  Phone Note Refill Request Message from:  Fax from Pharmacy on May 14, 2009 5:00 PM  Refills Requested: Medication #1:  OMEPRAZOLE 20 MG  CPDR 2 once daily Initial call taken by: Ami Bullins CMA,  May 14, 2009 5:00 PM    Prescriptions: OMEPRAZOLE 20 MG  CPDR (OMEPRAZOLE) 2 once daily  #180 x 3   Entered by:   Ami Bullins CMA   Authorized by:   [name]   Signed by:   Bill Salinas CMA on 05/14/2009   Method used:   Electronically to        [facility] (retail)       [address] [place], [place]  [address]       Ph: [phone]       Fax: [phone]   RxID:   [id number]

## 2010-06-01 NOTE — Assessment & Plan Note (Signed)
Summary: swollen left leg, offered sooner appt/cd   Vital Signs:  Patient profile:   77 year old female Height:      62 inches Weight:      165 pounds BMI:     30.29 O2 Sat:      95 % on Room air Temp:     98.9 degrees F oral Pulse rate:   115 / minute BP sitting:   128 / 78  (left arm) Cuff size:   regular  Vitals Entered By: Bill Salinas CMA (Sep 10, 2009 2:18 PM)  O2 Flow:  Room air CC: pt here with complaint of swelling in her left lower leg with rash/ ab   Primary Care Provider:  Clemencia Helzer  CC:  pt here with complaint of swelling in her left lower leg with rash/ ab.  History of Present Illness: Laughter is the best medicine and she talks with her sister every day until they get a good laught  She is having swelling of the left leg and skin changes, she did see Dr. Margo Aye who diagnosed steroid acne of the leg. She has developed a new rash on the lateral dorsum left foot that is tough and will scale. She is using gold bond cream with aloe vera and gold bond powder. She has also use clobetasol propionate 0.05% cream for some skin lesions. In addition, due to venous insufficiency, she is elevating her legs.  She does have increased abdominal pain bilateral lower quadrants. She has increased omeprazole to 40mg , as directed, but in split doses. However, she is having increased pain that is not relieved with tramadol.  Current Medications (verified): 1)  Nifediac Cc 30 Mg  Tb24 (Nifedipine) .... Take One Tablet Daily 2)  Omeprazole 20 Mg  Cpdr (Omeprazole) .... 2 Once Daily 3)  Alprazolam 0.25 Mg  Tabs (Alprazolam) .... Takes 1/2 Tablet Daily Up To Eight As Needed 4)  Centrum Silver   Tabs (Multiple Vitamins-Minerals) .... Take One Tablet Daily 5)  Lopressor 50 Mg Tabs (Metoprolol Tartrate) .... Take 1 Tablet By Mouth Once A Day. 6)  Simvastatin 20 Mg Tabs (Simvastatin) .Marland Kitchen.. 1 By Mouth Q Pm For Cholesterol 7)  Ipratropium Bromide 0.06 % Soln (Ipratropium Bromide) .Marland Kitchen.. 1-2 Sprays Each  Nostril Up To 3 X Daily As Needed, Before Meals, For Runny Nose. 8)  Tramadol Hcl 50 Mg Tabs (Tramadol Hcl) .Marland Kitchen.. 1 By Mouth Q 6 Prn 9)  Gas-X 80 Mg Chew (Simethicone) .... Take As Needed 10)  Hydrocodone-Acetaminophen 7.5-750 Mg Tabs (Hydrocodone-Acetaminophen) .... 1/2 Tab Every 3 Hours As Needed Limit of 4 Full Doses/day (8 1/2 Doses) 11)  Plavix 75 Mg Tabs (Clopidogrel Bisulfate) .Marland Kitchen.. 1 By Mouth Once Daily  Allergies (verified): 1)  ! Penicillin 2)  ! Ultram 3)  ! Asa 4)  ! 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) ALLERGY (ICD-995.3) PAROXYSMAL NOCTURNAL DYSPNEA (ICD-786.09) OSTEOARTHRITIS, KNEES, BILATERAL (ICD-715.96) RESTRICTIVE LUNG DISEASE (ICD-518.89) UNSPECIFIED ANEMIA (ICD-285.9) UNS ADVRS EFF OTH RX MEDICINAL&BIOLOGICAL SBSTNC (DGL-875.64) HEMATOCHEZIA (ICD-578.1) PARANOIA (ICD-297.9) DEGENERATIVE DISC DISEASE, LUMBAR SPINE (ICD-722.52) Hx of GI BLEEDING (ICD-578.9) DYSPEPSIA, HX OF (ICD-V12.79) GASTRIC ULCER, HX OF (ICD-V12.71) Hx of ANXIETY (ICD-300.00) IRRITABLE BOWEL SYNDROME, HX OF (ICD-V12.79) OSTEOARTHRITIS (ICD-715.90) HYPERTENSION (ICD-401.9) Fx coccyx- limited mobility      Past Surgical History: Last updated: 01/01/2009 * RIGHT SHOULDER SURGERY pilonidal cyst PSH reviewed for relevance, FH reviewed for relevance  Review of Systems  The patient complains of abdominal pain.  The patient denies anorexia, fever, weight loss, weight gain, decreased hearing, hoarseness, chest pain, dyspnea on exertion, prolonged cough, hematochezia, genital sores, difficulty walking, depression, and enlarged lymph nodes.    Physical Exam  General:  overweight white female in no distress Head:  Normocephalic and atraumatic without obvious abnormalities. No apparent alopecia or balding. Eyes:  pupils equal, pupils round, corneas and lenses clear, and no injection.     Neck:  supple and no carotid bruits.   Lungs:  normal respiratory effort and normal breath sounds.   Heart:  normal rate and regular rhythm.   Abdomen:  soft, non-tender, normal bowel sounds, no masses, and no guarding.   Msk:  normal ROM, no joint swelling, and no joint instability.   Neurologic:  alert & oriented X3, cranial nerves II-XII intact, strength normal in all extremities, and gait normal.   Skin:  mild erythema left distal LE. No open lesions. On the lateral dorsum dark brown skin changes with raised lesions. No drainage, no excoriation.  Cervical Nodes:  no anterior cervical adenopathy.   Psych:  Oriented X3 and memory intact for recent and remote.     Impression & Recommendations:  Problem # 1:  SKIN LESION (ICD-709.9)  skin change of unkown etiology. No sign of infection.  Plan - may use clobetasol propionate 0.05% two times a day          if not better may need biopsy vs repeat derm consult  Orders: Prescription Created Electronically [phone])  Problem # 2:  IRRITABLE BOWEL SYNDROME, HX OF (ICD-V12.79) Patient with lower abdominal pain that is worse after eating. Exam is negative. She does usually get relief with tramadol.  Plan- no anti-spasmodic medication unless symptoms are worse.   Problem # 3:  HYPERTENSION (ICD-401.9) Reviewed blood pressure readings from home: good control with occasional excursions. She has an established med adm schedule: nifediac in AM and the metoporolol at 6 PM.   Plan - continue present medications.   Her updated medication list for this problem includes:    Nifediac Cc 30 Mg Tb24 (Nifedipine) .Marland Kitchen... Take one tablet daily    Lopressor 50 Mg Tabs (Metoprolol tartrate) .Marland Kitchen... Take 1 tablet by mouth once a day.  Problem # 4:  OSTEOARTHRITIS (ICD-715.90) Stable on current medications.   Her updated medication list for this problem includes:    Tramadol Hcl 50 Mg Tabs (Tramadol hcl) .Marland Kitchen... 1 by mouth q 6 prn     Hydrocodone-acetaminophen 7.5-750 Mg Tabs (Hydrocodone-acetaminophen) .Marland Kitchen... 1/2 tab every 3 hours as needed limit of 4 full doses/day (8 1/2 doses)  Problem # 5:  GASTRIC ULCER, HX OF (ICD-V12.71) Patient is taking split dose omeprazole. Instructed to take 20mg  x 2 together  before morning coffee.   Her updated medication list for this problem includes:    Omeprazole 20 Mg Cpdr (Omeprazole) .Marland Kitchen... 2 once daily  Complete Medication List: 1)  Nifediac Cc 30 Mg Tb24 (Nifedipine) .... Take one tablet daily 2)  Omeprazole 20 Mg Cpdr (Omeprazole) .... 2 once daily 3)  Alprazolam 0.25 Mg Tabs (Alprazolam) .... Takes 1/2 tablet daily up to eight as needed 4)  Centrum Silver Tabs (Multiple vitamins-minerals) .... Take one tablet daily 5)  Lopressor 50 Mg Tabs (Metoprolol tartrate) .... Take 1 tablet by mouth once a day. 6)  Simvastatin 20 Mg Tabs (Simvastatin) .Marland Kitchen.. 1 by mouth q pm for cholesterol 7)  Ipratropium Bromide 0.06 % Soln (Ipratropium bromide) .Marland Kitchen.. 1-2  sprays each nostril up to 3 x daily as needed, before meals, for runny nose. 8)  Tramadol Hcl 50 Mg Tabs (Tramadol hcl) .Marland Kitchen.. 1 by mouth q 6 prn 9)  Gas-x 80 Mg Chew (Simethicone) .... Take as needed 10)  Hydrocodone-acetaminophen 7.5-750 Mg Tabs (Hydrocodone-acetaminophen) .... 1/2 tab every 3 hours as needed limit of 4 full doses/day (8 1/2 doses) 11)  Plavix 75 Mg Tabs (Clopidogrel bisulfate) .Marland Kitchen.. 1 by mouth once daily 12)  Clobetasol Propionate 0.05 % Crea (Clobetasol propionate) .... Apply to  rash on left foot two times a day.  Patient Instructions: 1)  Rah on foot - this is not FLESH EATING BACTERIA. I do not know what this is. OK to use clobetasol cream on this twice a day. If it does not go away will need either Derm consult or biopsy. 2)  Stomach acid - take omeprazole 20mg , two tablets together before morning breakfast 3)  Blood pressure - very good control. Continue present medications on the current schedule. 4)  Stomach ache  after meals in the lower abdomen. Most likielly IBS. OK to use tramadol as needed for discomfort.  Prescriptions: CLOBETASOL PROPIONATE 0.05 % CREA (CLOBETASOL PROPIONATE) apply to  rash on left foot two times a day.  #60 g x 1   Entered and Authorized by:   [name]   Signed by:   [name] on 09/10/2009   Method used:   Electronically to        [facility] (retail)       [address]       [place], [place]  [address]       Ph: [phone]       Fax: [phone]   RxID:   [id number]

## 2010-06-01 NOTE — Progress Notes (Signed)
Summary: muscle pain  Phone Note Call from Patient Call back at Home Phone [phone]   Caller: Patient Summary of Call: Patient called stating that she has been taking simvastatin for over a year. She c/o muscle and joint pain everywhere except for hip area and read on package insert to report this to MD. She would like to know if she can d/c on decreased med and if so, will this require titrating off. Please advise Thanks Initial call taken by: Rock Nephew CMA,  March 29, 2010 9:32 AM  Follow-up for Phone Call        may stop simvastatin and see if pain goes away.  Follow-up by: [name],  March 29, 2010 10:28 AM  Additional Follow-up for Phone Call Additional follow up Details #1::        Pt informed  Additional Follow-up by: Lamar Sprinkles, CMA,  March 29, 2010 6:42 PM    New/Updated Medications: SIMVASTATIN 20 MG TABS (SIMVASTATIN) 1 by mouth q PM for cholesterol -HOLD 03/29/10

## 2010-06-01 NOTE — Progress Notes (Signed)
  Phone Note Refill Request   Refills Requested: Medication #1:  PLAVIX 75 MG TABS 1 by mouth once daily    Prescriptions: PLAVIX 75 MG TABS (CLOPIDOGREL BISULFATE) 1 by mouth once daily  #30 x 3   Entered by:   Alysia Penna   Authorized by:   [name]   Signed by:   Alysia Penna on 02/03/2010   Method used:   Electronically to        [facility] (retail)       [address]       [place], [place]  [address]       Ph: [phone]       Fax: [phone]   RxID:   [id number]

## 2010-06-01 NOTE — Medication Information (Signed)
Summary: Clarification/[facility]  Clarification/[facility]   Imported By: Lester Nephi 04/07/2010 10:31:57  _____________________________________________________________________  External Attachment:    Type:   Image     Comment:   External Document

## 2010-06-01 NOTE — Progress Notes (Signed)
  Phone Note Refill Request Message from:  Fax from Pharmacy on March 24, 2010 1:51 PM  Refills Requested: Medication #1:  ALPRAZOLAM 0.25 MG  TABS Takes 1/2 tablet daily up to eight as needed fax from [facility] please Advise refills  Initial call taken by: Ami Bullins CMA,  March 24, 2010 1:52 PM  Follow-up for Phone Call        ok to refill x 5 Follow-up by: [name],  March 24, 2010 5:18 PM    Prescriptions: ALPRAZOLAM 0.25 MG  TABS (ALPRAZOLAM) Takes 1/2 tablet daily up to eight as needed  #360 x 1   Entered by:   Lamar Sprinkles, CMA   Authorized by:   [name]   Signed by:   Lamar Sprinkles, CMA on 03/24/2010   Method used:   Printed then faxed to ...       [facility]* (retail)       [address] [place], [place]  [address]       Ph: [phone]       Fax: [phone]   RxID:   [id number]   Appended Document:  Pt informed, rx faxed today

## 2010-06-01 NOTE — Progress Notes (Signed)
  Phone Note Refill Request Message from:  Fax from Pharmacy on February 03, 2010 1:50 PM  Refills Requested: Medication #1:  CLOBETASOL PROPIONATE 0.05 % CREA apply to  rash on left foot two times a day. Initial call taken by: Ami Bullins CMA,  February 03, 2010 1:50 PM    Prescriptions: CLOBETASOL PROPIONATE 0.05 % CREA (CLOBETASOL PROPIONATE) apply to  rash on left foot two times a day.  #60 g x 2   Entered by:   Ami Bullins CMA   Authorized by:   [name]   Signed by:   Bill Salinas CMA on 02/03/2010   Method used:   Electronically to        [facility] (retail)       [address]       [place], [place]  [address]       Ph: [phone]       Fax: [phone]   RxID:   [id number]

## 2010-06-01 NOTE — Progress Notes (Signed)
Summary: Symptoms from Plavix?   Phone Note Call from Patient   Summary of Call: Pt started plavix 6 wks ago. Starting this weekend pt c/o dizzyness, increase urination, difficulty sleeping, and increased thirst. Would Dr Debby Bud want labs ordered?   Initial call taken by: Lamar Sprinkles, CMA,  July 27, 2009 3:18 PM  Follow-up for Phone Call        had full labs in January. Normal CBC, normal metabolic with glucose of 97. OK for U/A 788.41, CBC  995.2 Follow-up by: [name],  July 27, 2009 6:17 PM  Additional Follow-up for Phone Call Additional follow up Details #1::        Left vm w/above for pt. She left mess earlier today, continues to have "spells", OK workin apt per dr. Left mess for pt to call office and schedule.  Additional Follow-up by: Lamar Sprinkles, CMA,  July 28, 2009 2:33 PM    Additional Follow-up for Phone Call Additional follow up Details #2::    Pt scheduled for work in apt Follow-up by: Lamar Sprinkles, CMA,  July 28, 2009 3:09 PM

## 2010-06-01 NOTE — Progress Notes (Signed)
  Phone Note Call from Patient Call back at [facility] Phone [phone]   Caller: Patient Summary of Call: Patient called [facility] c/o dizziness, abdominal pain, and nightly urinary freq x 1 week. Returned called to pt who states that she was started on Plabix 06/15/09 and not sure if this could be related. Patient can only be seen on mon,thurs, appt set for today with Norins.Marland KitchenMarland KitchenMarland KitchenAlvy Beal Archie CMA  December 21, 2009 9:55 AM

## 2010-06-01 NOTE — Assessment & Plan Note (Signed)
Summary: see emr phone note/dizzy spells/ok Jacqueline [name]/cd   Vital Signs:  Patient profile:   77 year old female Height:      62 inches Weight:      160 pounds BMI:     29.37 O2 Sat:      96 % on Room air Temp:     97.8 degrees F oral Pulse rate:   73 / minute BP sitting:   110 / 78  (left arm) Cuff size:   regular  Vitals Entered By: Bill Salinas CMA (July 29, 2009 11:57 AM)  O2 Flow:  Room air CC: pt here with complaint of confusion, weakness,  and dizziness after taking her afternoon dose of lopressor/ ab   Primary Care Provider:  Elgin Carn  CC:  pt here with complaint of confusion, weakness, and and dizziness after taking her afternoon dose of lopressor/ ab.  History of Present Illness: Patient presents for a problem with dizziness and lightheadedness. She feels that she has cognitive change. This all started since Sunday. Patient has been taking lopressor 50mg  at 0900 and again at 1300hr. She takes nifedipine at 0600. She does track these changes since there was a change in the pill appearance ( different mfg). She has had a normal eye exam.   Current Medications (verified): 1)  Nifediac Cc 30 Mg  Tb24 (Nifedipine) .... Take One Tablet Daily 2)  Omeprazole 20 Mg  Cpdr (Omeprazole) .... 2 Once Daily 3)  Alprazolam 0.25 Mg  Tabs (Alprazolam) .... Takes 1/2 Tablet Daily Up To Eight As Needed 4)  Centrum Silver   Tabs (Multiple Vitamins-Minerals) .... Take One Tablet Daily 5)  Lopressor 50 Mg Tabs (Metoprolol Tartrate) .... Take 1 Tablet By Mouth Two Times A Day 6)  Simvastatin 20 Mg Tabs (Simvastatin) .Marland Kitchen.. 1 By Mouth Q Pm For Cholesterol 7)  Ipratropium Bromide 0.06 % Soln (Ipratropium Bromide) .Marland Kitchen.. 1-2 Sprays Each Nostril Up To 3 X Daily As Needed, Before Meals, For Runny Nose. 8)  Tramadol Hcl 50 Mg Tabs (Tramadol Hcl) .Marland Kitchen.. 1 By Mouth Q 6 Prn 9)  Gas-X 80 Mg Chew (Simethicone) .... Take As Needed 10)  Hydrocodone-Acetaminophen 7.5-750 Mg Tabs (Hydrocodone-Acetaminophen) .... 1/2 Tab  Every 3 Hours As Needed Limit of 3 Full Doses/day (6 1/2 Doses) 11)  Plavix 75 Mg Tabs (Clopidogrel Bisulfate) .Marland Kitchen.. 1 By Mouth Once Daily  Allergies: 1)  ! Penicillin 2)  ! Ultram 3)  ! Asa 4)  ! Metoclopramide Hcl (Metoclopramide Hcl) PMH-FH-SH reviewed-no changes except otherwise noted  Review of Systems  The patient denies anorexia, fever, weight loss, decreased hearing, chest pain, syncope, dyspnea on exertion, prolonged cough, and abdominal pain.    Physical Exam  General:  Well-developed,well-nourished,in no acute distress; alert,appropriate and cooperative throughout examination Head:  normocephalic and atraumatic.   Lungs:  no wheezes.   Heart:  normal rate and regular rhythm.   Psych:  Oriented X3, memory intact for recent and remote, normally interactive, good eye contact, and slightly anxious.     Impression & Recommendations:  Problem # 1:  HYPERTENSION (ICD-401.9)  Her updated medication list for this problem includes:    Nifediac Cc 30 Mg Tb24 (Nifedipine) .Marland Kitchen... Take one tablet daily    Lopressor 50 Mg Tabs (Metoprolol tartrate) .Marland Kitchen... Take 1 tablet by mouth once a day.  BP today: 110/78 Prior BP: 136/72 (06/15/2009)  Labs Reviewed: K+: 4.6 (05/04/2009)  Perhaps over controlled. He symptoms of being light-headed, etc. may be due to dyptension following second dose  of b-blocker which she was taking incorrectly - two times a day interpreted as 0900, 1300.  Plan - change lopressor to once a day.           continue other meds           report back if symptoms to not clear.  Complete Medication List: 1)  Nifediac Cc 30 Mg Tb24 (Nifedipine) .... Take one tablet daily 2)  Omeprazole 20 Mg Cpdr (Omeprazole) .... 2 once daily 3)  Alprazolam 0.25 Mg Tabs (Alprazolam) .... Takes 1/2 tablet daily up to eight as needed 4)  Centrum Silver Tabs (Multiple vitamins-minerals) .... Take one tablet daily 5)  Lopressor 50 Mg Tabs (Metoprolol tartrate) .... Take 1 tablet by  mouth once a day. 6)  Simvastatin 20 Mg Tabs (Simvastatin) .Marland Kitchen.. 1 by mouth q pm for cholesterol 7)  Ipratropium Bromide 0.06 % Soln (Ipratropium bromide) .Marland Kitchen.. 1-2 sprays each nostril up to 3 x daily as needed, before meals, for runny nose. 8)  Tramadol Hcl 50 Mg Tabs (Tramadol hcl) .Marland Kitchen.. 1 by mouth q 6 prn 9)  Gas-x 80 Mg Chew (Simethicone) .... Take as needed 10)  Hydrocodone-acetaminophen 7.5-750 Mg Tabs (Hydrocodone-acetaminophen) .... 1/2 tab every 3 hours as needed limit of 4 full doses/day (8 1/2 doses) 11)  Plavix 75 Mg Tabs (Clopidogrel bisulfate) .Marland Kitchen.. 1 by mouth once daily  Patient Instructions: 1)  Blood pressure control - suspect your symptoms of feeling groggy, blurred vision, etc are related to a drop in blood pressure that is medication related!. Plan - stop the 1PM dose of lopressor. Continue to take the 9 AM dose as well as continuing your other medications. Pay attention: if you feel bad after the 9 AM dose of lopresser let me know so we can consider making other changes.  Prescriptions: HYDROCODONE-ACETAMINOPHEN 7.5-750 MG TABS (HYDROCODONE-ACETAMINOPHEN) 1/2 tab every 3 hours as needed limit of 4 full doses/day (8 1/2 doses)  #120 x 2   Entered and Authorized by:   [name]   Signed by:   [name] on 07/29/2009   Method used:   Print then Give to Patient   RxID:   [id number]

## 2010-06-01 NOTE — Progress Notes (Signed)
  Phone Note Refill Request Message from:  Fax from Pharmacy on February 17, 2010 11:48 AM  Refills Requested: Medication #1:  HYDROCODONE-ACETAMINOPHEN 7.5-750 MG TABS 1/2 tab every 3 hours as needed limit of 4 full doses/day (8 1/2 doses)   Last Refilled: 01/21/2010 Please Advise refill  Initial call taken by: Ami Bullins CMA,  February 17, 2010 11:49 AM  Follow-up for Phone Call        ok to refill x 5 Follow-up by: [name],  February 17, 2010 2:15 PM    Prescriptions: HYDROCODONE-ACETAMINOPHEN 7.5-750 MG TABS (HYDROCODONE-ACETAMINOPHEN) 1/2 tab every 3 hours as needed limit of 4 full doses/day (8 1/2 doses)  #120 x 5   Entered by:   Lamar Sprinkles, CMA   Authorized by:   [name]   Signed by:   Lamar Sprinkles, CMA on 02/17/2010   Method used:   Telephoned to ...       [facility] (retail)       [address]       [place], [place]  [address]       Ph: [phone]       Fax: [phone]   RxID:   [id number]

## 2010-06-01 NOTE — Medication Information (Signed)
Summary: rov-tp  Anticoagulant Therapy  Managed by: Bethena Midget, RN, BSN PCP: Link Snuffer MD: Daleen Squibb MD, Maisie Fus Indication 1: Cerebrovascular Accident Lab Used: [facility] Site: [address] INR POC 3.2 INR RANGE 2.0-3.0  Dietary changes: no    Health status changes: yes       Details: Pt verbalizes abd pain, caregiver with her states that was the main reason for the appt but pt. forgot to mention. She will call his office back and speak with his nurse  Bleeding/hemorrhagic complications: no    Recent/future hospitalizations: no    Any changes in medication regimen? no    Recent/future dental: no  Any missed doses?: no       Is patient compliant with meds? yes       Allergies: 1)  ! Penicillin 2)  ! Ultram 3)  ! Asa 4)  ! Metoclopramide Hcl (Metoclopramide Hcl)  Anticoagulation Management History:      The patient is taking warfarin and comes in today for a routine follow up visit.  Positive risk factors for bleeding include an age of 75 years or older, history of CVA/TIA, and history of GI bleeding.  The bleeding index is 'high risk'.  Positive CHADS2 values include History of HTN, Age > 75 years old, and Prior Stroke/CVA/TIA.  Anticoagulation responsible provider: Daleen Squibb MD, Maisie Fus.  INR POC: 3.2.  Cuvette Lot#: [id number].  Exp: 06/2010.    Anticoagulation Management Assessment/Plan:      The patient's current anticoagulation dose is Warfarin sodium 5 mg tabs: Take as directed by coumadin clinic..  The target INR is 2.0-3.0.  The next INR is due 06/05/2009.  Anticoagulation instructions were given to patient.  Results were reviewed/authorized by Bethena Midget, RN, BSN.  She was notified by Bethena Midget, RN, BSN.         Prior Anticoagulation Instructions: Take as below:  Current Anticoagulation Instructions: INR 3.2 Today skip coumadin dose, then change dose to 1/2 tablet daily= 2.5mg s daily.

## 2010-06-01 NOTE — Progress Notes (Signed)
Summary: RX question  Phone Note Call from Patient Call back at Home Phone [phone]   Summary of Call: Patient left message on triage stating that she has been doing very well on/with her pain meds and has noticed that her prescription has changed. Per the patient she has always rec'd qty #120/month and has now began to recieve #90/month with notation " with limit of 6 doses (3 full tabs) daily - #90/month."   Patient would like to know why her qty has been reduced and why the extra notation above was added. She prefers to go back to qty of #120 if possible. Please advise. Initial call taken by: Lucious Groves,  June 02, 2009 11:19 AM  Follow-up for Phone Call        error- shoulod be #120. I did not intend to change her dose Follow-up by: [name],  June 02, 2009 1:34 PM  Additional Follow-up for Phone Call Additional follow up Details #1::        Patient notified  and she has decided to get her prescriptions from [facility] instead and will call us before she runs out so we can send there instead. Additional Follow-up by: Lucious Groves,  June 02, 2009 4:01 PM

## 2010-06-01 NOTE — Progress Notes (Signed)
  Phone Note Refill Request Message from:  Fax from Pharmacy on January 05, 2010 4:22 PM  Refills Requested: Medication #1:  ALPRAZOLAM 0.25 MG  TABS Takes 1/2 tablet daily up to eight as needed   Last Refilled: 01/05/2010 last ov was 12/21/2009, please Advise refill  Initial call taken by: Ami Bullins CMA,  January 05, 2010 4:22 PM  Follow-up for Phone Call        OK to refill x 5 Follow-up by: [name],  January 05, 2010 5:39 PM    Prescriptions: ALPRAZOLAM 0.25 MG  TABS (ALPRAZOLAM) Takes 1/2 tablet daily up to eight as needed  #120 x 5   Entered by:   Ami Bullins CMA   Authorized by:   [name]   Signed by:   Bill Salinas CMA on 01/06/2010   Method used:   Telephoned to ...       [facility] (retail)       [address]       [place], [place]  [address]       Ph: [phone]       Fax: [phone]   RxID:   [id number]

## 2010-06-01 NOTE — Medication Information (Signed)
Summary: Coumadin Clinic  Anticoagulant Therapy  Managed by: Inactive PCP: [facility] Supervising MD: Daleen Squibb MD, Maisie Fus Indication 1: Cerebrovascular Accident Lab Used: [employer] Site: [facility] INR RANGE 2.0-3.0          Comments: Per pt she is now on Plavix and not on coumadin per Dr. Debby Bud.  Allergies: 1)  ! Penicillin 2)  ! Ultram 3)  ! Asa 4)  ! Metoclopramide Hcl (Metoclopramide Hcl)  Anticoagulation Management History:      Positive risk factors for bleeding include an age of 75 years or older, history of CVA/TIA, and history of GI bleeding.  The bleeding index is 'high risk'.  Positive CHADS2 values include History of HTN, Age > 75 years old, and Prior Stroke/CVA/TIA.  Anticoagulation responsible provider: Daleen Squibb MD, Maisie Fus.  Exp: 06/2010.    Anticoagulation Management Assessment/Plan:      The target INR is 2.0-3.0.  The next INR is due 06/25/2009.  Anticoagulation instructions were given to patient.  Results were reviewed/authorized by Inactive.         Prior Anticoagulation Instructions: INR 1.8 Today take 1 tablet then resume 1/2 tablet everyday. Recheck in 2 weeks.

## 2010-06-01 NOTE — Progress Notes (Signed)
  Phone Note Refill Request Message from:  Fax from Pharmacy on October 06, 2009 10:37 AM  Refills Requested: Medication #1:  PLAVIX 75 MG TABS 1 by mouth once daily Initial call taken by: Ami Bullins CMA,  October 06, 2009 10:37 AM    Prescriptions: PLAVIX 75 MG TABS (CLOPIDOGREL BISULFATE) 1 by mouth once daily  #30 x 3   Entered by:   Ami Bullins CMA   Authorized by:   [name]   Signed by:   Bill Salinas CMA on 10/06/2009   Method used:   Electronically to        [facility] [address] (retail)       [address]       [place], [place]  [address]       Ph: [phone]       Fax: [phone]   RxID:   [id number]

## 2010-06-01 NOTE — Progress Notes (Signed)
Summary: Questions  Phone Note Call from Patient   Summary of Call: Pt c/o weakness a couple hours after taking nifedipine. She says problem started after starting coumadin. She also has had c/o dark circles under her eyes and varicose veins. Both she has never had before. When she asks the coumadin clinic they just say that this could be a part of the aging process.  Initial call taken by: Lamar Sprinkles, CMA,  May 07, 2009 8:34 AM  Follow-up for Phone Call        Just seen in the office. I don't think her medications are making her varicose veins worse or causing dark circles under her eyes.  Follow-up by: [name],  May 07, 2009 1:09 PM  Additional Follow-up for Phone Call Additional follow up Details #1::        What about the weakness since being on coumadin?  Additional Follow-up by: Lamar Sprinkles, CMA,  May 07, 2009 2:07 PM    Additional Follow-up for Phone Call Additional follow up Details #2::    Generally not a side effect of coumadin, usually reflects the underlying condition(s). If she continues to have questions I am happy to see her again. Follow-up by: [name],  May 13, 2009 3:41 PM  Additional Follow-up for Phone Call Additional follow up Details #3:: Details for Additional Follow-up Action Taken: Patient notified. Lucious Groves,  May 13, 2009 4:47 PM

## 2010-06-01 NOTE — Progress Notes (Signed)
  Phone Note Refill Request Message from:  Fax from Pharmacy on June 05, 2009 10:17 AM  Refills Requested: Medication #1:  NIFEDIAC CC 30 MG  TB24 Take one tablet daily   Last Refilled: 03/09/2009 Initial call taken by: Ami Bullins CMA,  June 05, 2009 10:17 AM    Prescriptions: NIFEDIAC CC 30 MG  TB24 (NIFEDIPINE) Take one tablet daily  #90 Tablet x 3   Entered by:   Ami Bullins CMA   Authorized by:   [name]   Signed by:   Bill Salinas CMA on 06/05/2009   Method used:   Electronically to        [facility] (retail)       [address] [place] [address]       [place], [place]  [address]       Ph: [phone]       Fax: [phone]   RxID:   [id number]

## 2010-06-01 NOTE — Assessment & Plan Note (Signed)
Summary: SEVERAL PROBLEMS/ FLU SHOT/NWS   Vital Signs:  Patient profile:   77 year old female Height:      62 inches Weight:      154 pounds BMI:     28.27 O2 Sat:      94 % on Room air Temp:     98.1 degrees F oral Pulse rate:   92 / minute BP sitting:   124 / 62  (left arm) Cuff size:   regular  Vitals Entered By: Bill Salinas CMA (January 21, 2010 1:23 PM)  O2 Flow:  Room air CC: ov to discuss BP. PT has her BP readings with her. She also wants to discuss VIt d and dulcolax/ ab   Primary Care Provider:  [facility]  CC:  ov to discuss BP. PT has her BP readings with her. She also wants to discuss VIt d and dulcolax/ ab.  History of Present Illness: Pt with left mid-abdomen bulge that is painfull. She has a known hernia that has been stable. She is now having intermittent pain and tenderness at the bulge.  She is concerned about venous pattern under the skin and that it may be related to plavix.   Wants to have ears repierced.   She has question about zinc toxicity due to warning on denture adhesive. Check UpToDate - no problem is ingestion is less than 100mg /day. Gave her a copy of chpt on dietary minerals   Current Medications (verified): 1)  Nifediac Cc 30 Mg  Tb24 (Nifedipine) .... Take One Tablet Daily 2)  Omeprazole 20 Mg  Cpdr (Omeprazole) .... 2 Once Daily 3)  Alprazolam 0.25 Mg  Tabs (Alprazolam) .... Takes 1/2 Tablet Daily Up To Eight As Needed 4)  Centrum Silver   Tabs (Multiple Vitamins-Minerals) .... Take One Tablet Daily 5)  Lopressor 50 Mg Tabs (Metoprolol Tartrate) .... Take 1 Tablet By Mouth Once A Day. 6)  Simvastatin 20 Mg Tabs (Simvastatin) .Marland Kitchen.. 1 By Mouth Q Pm For Cholesterol 7)  Ipratropium Bromide 0.06 % Soln (Ipratropium Bromide) .Marland Kitchen.. 1-2 Sprays Each Nostril Up To 3 X Daily As Needed, Before Meals, For Runny Nose. 8)  Tramadol Hcl 50 Mg Tabs (Tramadol Hcl) .Marland Kitchen.. 1 By Mouth Q 6 Prn 9)  Gas-X 80 Mg Chew (Simethicone) .... Take As Needed 10)   Hydrocodone-Acetaminophen 7.5-750 Mg Tabs (Hydrocodone-Acetaminophen) .... 1/2 Tab Every 3 Hours As Needed Limit of 4 Full Doses/day (8 1/2 Doses) 11)  Plavix 75 Mg Tabs (Clopidogrel Bisulfate) .Marland Kitchen.. 1 By Mouth Once Daily 12)  Clobetasol Propionate 0.05 % Crea (Clobetasol Propionate) .... Apply To  Rash On Left Foot Two Times A Day. 13)  Meclizine Hcl 12.5 Mg Tabs (Meclizine Hcl) .Marland Kitchen.. 1 By Mouth Q6 Hours For Dysequilibrium Associated With Labyrinthitis.  Allergies (verified): 1)  ! Penicillin 2)  ! Ultram 3)  ! Asa 4)  ! 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) ALLERGY (ICD-995.3) PAROXYSMAL NOCTURNAL DYSPNEA (ICD-786.09) OSTEOARTHRITIS, KNEES, BILATERAL (ICD-715.96) RESTRICTIVE LUNG DISEASE (ICD-518.89) UNSPECIFIED ANEMIA (ICD-285.9) UNS ADVRS EFF OTH RX MEDICINAL&BIOLOGICAL SBSTNC (ZOX-096.04) HEMATOCHEZIA (ICD-578.1) PARANOIA (ICD-297.9) DEGENERATIVE DISC DISEASE, LUMBAR SPINE (ICD-722.52) Hx of GI BLEEDING (ICD-578.9) DYSPEPSIA, HX OF (ICD-V12.79) GASTRIC ULCER, HX OF (ICD-V12.71) Hx of ANXIETY (ICD-300.00) IRRITABLE BOWEL SYNDROME, HX OF (ICD-V12.79) OSTEOARTHRITIS (ICD-715.90) HYPERTENSION (ICD-401.9) Fx coccyx- limited mobility      Past Surgical History: Last updated: 01/01/2009 * RIGHT SHOULDER SURGERY pilonidal cyst PSH reviewed for relevance, FH reviewed for relevance  Review of Systems  The patient denies anorexia, fever, weight loss, decreased hearing, chest pain, syncope, dyspnea on exertion, headaches, abdominal pain, severe indigestion/heartburn, muscle weakness, difficulty walking, abnormal bleeding, and enlarged lymph nodes.    Physical Exam  General:  WNWD white female in no distress Head:  normocephalic and atraumatic.   Eyes:  pupils equal and pupils round.   Neck:  supple and full ROM.   Lungs:  normal respiratory effort, normal breath sounds, no  crackles, and no wheezes.   Heart:  normal rate, regular rhythm, and no murmur.   Abdomen:  4 cm bulging mass left quadrant tender to palpationsoft, normal bowel sounds, no guarding, and no rigidity.   Msk:  normal ROM, no joint warmth, no redness over joints, and no joint instability.   Pulses:  2+ radial Neurologic:  alert & oriented X3, cranial nerves II-XII intact, strength normal in all extremities, and gait normal.   Skin:  turgor normal and color normal.  thinning of dermis at the olacranon fossa- visible vein network Cervical Nodes:  no anterior cervical adenopathy and no posterior cervical adenopathy.   Psych:  Oriented X3, memory intact for recent and remote, normally interactive, and good eye contact.     Impression & Recommendations:  Problem # 1:  ABDOMINAL WALL HERNIA (ICD-553.20)  Patient with a long standing abdominal wall hernia left quadrant which is now more prominent and tender to palpation. It is reducible.  Plan - referral to [facility] - Dr. Diona Browner, Friday Sept 30th - patient aware.  Orders: Surgical Referral (Surgery)  Complete Medication List: 1)  Nifediac Cc 30 Mg Tb24 (Nifedipine) .... Take one tablet daily 2)  Omeprazole 20 Mg Cpdr (Omeprazole) .... 2 once daily 3)  Alprazolam 0.25 Mg Tabs (Alprazolam) .... Takes 1/2 tablet daily up to eight as needed 4)  Centrum Silver Tabs (Multiple vitamins-minerals) .... Take one tablet daily 5)  Lopressor 50 Mg Tabs (Metoprolol tartrate) .... Take 1 tablet by mouth once a day. 6)  Simvastatin 20 Mg Tabs (Simvastatin) .Marland Kitchen.. 1 by mouth q pm for cholesterol 7)  Ipratropium Bromide 0.06 % Soln (Ipratropium bromide) .Marland Kitchen.. 1-2 sprays each nostril up to 3 x daily as needed, before meals, for runny nose. 8)  Tramadol Hcl 50 Mg Tabs (Tramadol hcl) .Marland Kitchen.. 1 by mouth q 6 prn 9)  Gas-x 80 Mg Chew (Simethicone) .... Take as needed 10)  Hydrocodone-acetaminophen 7.5-750 Mg Tabs (Hydrocodone-acetaminophen) .... 1/2 tab every 3 hours as needed  limit of 4 full doses/day (8 1/2 doses) 11)  Plavix 75 Mg Tabs (Clopidogrel bisulfate) .Marland Kitchen.. 1 by mouth once daily 12)  Clobetasol Propionate 0.05 % Crea (Clobetasol propionate) .... Apply to  rash on left foot two times a day. 13)  Meclizine Hcl 12.5 Mg Tabs (Meclizine hcl) .Marland Kitchen.. 1 by mouth q6 hours for dysequilibrium associated with labyrinthitis.

## 2010-06-01 NOTE — Progress Notes (Signed)
Summary: Refill--Hydrocodone/Tramadol  Phone Note Refill Request Message from:  Fax from Pharmacy on October 27, 2009 11:22 AM  Refills Requested: Medication #1:  HYDROCODONE-ACETAMINOPHEN 7.5-750 MG TABS 1/2 tab every 3 hours as needed limit of 4 full doses/day (8 1/2 doses)  Medication #2:  TRAMADOL HCL 50 MG TABS 1 by mouth q 6 prn Initial call taken by: Lucious Groves,  October 27, 2009 11:22 AM  Follow-up for Phone Call        OK for refills. Hydrocodone x 3 Tramadol x 3 Follow-up by: [name],  October 27, 2009 1:13 PM    Prescriptions: TRAMADOL HCL 50 MG TABS (TRAMADOL HCL) 1 by mouth q 6 prn  #100 x 3   Entered by:   Ami Bullins CMA   Authorized by:   [name]   Signed by:   Bill Salinas CMA on 10/27/2009   Method used:   Telephoned to ...       [facility] (retail)       [address]       [place], [place]  [address]       Ph: [phone]       Fax: [phone]   RxID:   [id number] HYDROCODONE-ACETAMINOPHEN 7.5-750 MG TABS (HYDROCODONE-ACETAMINOPHEN) 1/2 tab every 3 hours as needed limit of 4 full doses/day (8 1/2 doses)  #120 x 3   Entered by:   Ami Bullins CMA   Authorized by:   [name]   Signed by:   Bill Salinas CMA on 10/27/2009   Method used:   Telephoned to ...       [facility] (retail)       [address]       [place], [place]  [address]       Ph: [phone]       Fax: [phone]   RxID:   [id number] CLOBETASOL PROPIONATE 0.05 % CREA (CLOBETASOL PROPIONATE) apply to  rash on left foot two times a day.  #60 g x 1   Entered by:   Ami Bullins CMA   Authorized by:   [name]   Signed by:   Bill Salinas CMA on 10/27/2009   Method used:   Electronically to        [facility] [address] (retail)       [address]       [place], [place]  [address]       Ph: [phone]  Fax: [phone]   RxID:   [id number]

## 2010-06-01 NOTE — Progress Notes (Signed)
Summary: UPDATE  Phone Note Call from Patient   Summary of Call: UPDATE: Pt took Nifedipine at 6am then metoprolol at 9 am w/breakfast. 30 min after pt c/o confusion and trouble concentrating. Took 1/2 xanax which seemed to help  BP at 8am: 152/88 9:00 152/88 11:00 120/69  Please advise.  Initial call taken by: Lamar Sprinkles, CMA,  July 31, 2009 11:51 AM  Follow-up for Phone Call        noted. Change metoprolol, which has been reduced to once a day, to PM-after supper dosing. Also, will need to continue checking blood pressures. Called pt. Follow-up by: [name],  July 31, 2009 6:05 PM

## 2010-06-01 NOTE — Assessment & Plan Note (Signed)
Summary: FU Natale Milch   Vital Signs:  Patient profile:   77 year old female Height:      62 inches Weight:      162 pounds BMI:     29.74 O2 Sat:      97 % on Room air Temp:     97.1 degrees F oral Pulse rate:   87 / minute BP sitting:   148 / 82  (left arm) Cuff size:   large  Vitals Entered By: Ami Bullins CMA (May 04, 2009 1:17 PM)  O2 Flow:  Room air CC: pt c/o weakness and fatigue  with dark circles under her eyes. Pt states symptoms have been present since starting coumadin in July. Pt's next protime check is 05/22/2009/ ab   Primary Care Provider:  Norins  CC:  pt c/o weakness and fatigue  with dark circles under her eyes. Pt states symptoms have been present since starting coumadin in July. Pt's next protime check is 05/22/2009/ ab.  History of Present Illness: Patient with multiple problems: generalized weakness, dizziness, dark circles under eyes, paresthesias in her hands that awakens her every several hours, paresthesia in her legs and feets. She has been having some trouble getting her coumadin/INR stabilized.   Current Medications (verified): 1)  Nifediac Cc 30 Mg  Tb24 (Nifedipine) .... Take One Tablet Daily 2)  Omeprazole 20 Mg  Cpdr (Omeprazole) .... 2 Once Daily 3)  Hydrocodone-Acetaminophen 7.5-750 Mg  Tabs (Hydrocodone-Acetaminophen) .... Take 1/2 Tablet Every 3 Hours As Needed 4)  Alprazolam 0.25 Mg  Tabs (Alprazolam) .... Takes 1/2 Tablet Daily Up To Eight As Needed 5)  Centrum Silver   Tabs (Multiple Vitamins-Minerals) .... Take One Tablet Daily 6)  Lopressor 50 Mg Tabs (Metoprolol Tartrate) .... Take 1 Tablet By Mouth Two Times A Day 7)  Simvastatin 20 Mg Tabs (Simvastatin) .Marland Kitchen.. 1 By Mouth Q Pm For Cholesterol 8)  Warfarin Sodium 5 Mg Tabs (Warfarin Sodium) .... Take As Directed By Coumadin Clinic. 9)  Ipratropium Bromide 0.06 % Soln (Ipratropium Bromide) .Marland Kitchen.. 1-2 Sprays Each Nostril Up To 3 X Daily As Needed, Before Meals, For Runny Nose. 10)  Tramadol  Hcl 50 Mg Tabs (Tramadol Hcl) .Marland Kitchen.. 1 By Mouth Q 6 Prn 11)  Gas-X 80 Mg Chew (Simethicone) .... Take As Needed  Allergies (verified): 1)  ! Penicillin 2)  ! Ultram 3)  ! Asa 4)  ! 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) OSTEOARTHRITIS, KNEES, BILATERAL (ICD-715.96) RESTRICTIVE LUNG DISEASE (ICD-518.89) UNSPECIFIED ANEMIA (ICD-285.9) UNS ADVRS EFF OTH RX MEDICINAL&BIOLOGICAL SBSTNC (GNF-621.30) HEMATOCHEZIA (ICD-578.1) PARANOIA (ICD-297.9) DEGENERATIVE DISC DISEASE, LUMBAR SPINE (ICD-722.52) Hx of GI BLEEDING (ICD-578.9) DYSPEPSIA, HX OF (ICD-V12.79) GASTRIC ULCER, HX OF (ICD-V12.71) Hx of ANXIETY (ICD-300.00) IRRITABLE BOWEL SYNDROME, HX OF (ICD-V12.79) OSTEOARTHRITIS (ICD-715.90) HYPERTENSION (ICD-401.9) Fx coccyx- limited mobility      Past Surgical History: Last updated: 2009-01-27 * RIGHT SHOULDER SURGERY pilonidal cyst  Family History: Last updated: January 27, 2009 Father- died age 72, CVA Mother- died age 22, CVA, DM  Social History: Last updated: 2009-01-27 HSG Married: '48- 8 yrs/ divorced; married  '60 - '88/ widowed 2 daughters - '50, '62; 2 sons - ''64, '65;  4 grandchildren lives alone - independent ADLs- pet cockatiel Patient states former smoker. -2004  Risk Factors: Exercise: no (05/26/2008)  Risk Factors: Smoking Status: quit (January 27, 2009) Passive Smoke Exposure: no (05/26/2008)  Review of Systems  The patient  denies anorexia, fever, weight loss, vision loss, decreased hearing, chest pain, syncope, dyspnea on exertion, headaches, abdominal pain, severe indigestion/heartburn, incontinence, suspicious skin lesions, difficulty walking, depression, abnormal bleeding, and enlarged lymph nodes.    Physical Exam  General:  WNWD white woman in no distress Head:  normocephalic, atraumatic, and no  abnormalities observed.   Eyes:  vision grossly intact, pupils equal, pupils round, corneas and lenses clear, and no injection.   Neck:  full ROM.   Lungs:  normal respiratory effort, normal breath sounds, no crackles, and no wheezes.   Heart:  normal rate, regular rhythm, no gallop, and no JVD.   Abdomen:  soft, non-tender, and normal bowel sounds.   Msk:  normal ROM, no joint swelling, no joint warmth, and no redness over joints.   Pulses:  2+ radial Extremities:  No clubbing, cyanosis, edema, or deformity noted with normal full range of motion of all joints.   Neurologic:  alert & oriented X3, cranial nerves II-XII intact, and gait normal. Negative Tinel's and Phalen's sign.   Skin:  mild fluid inferior to the orbit bilaterally with mild darkening Cervical Nodes:  no anterior cervical adenopathy and no posterior cervical adenopathy.   Psych:  Oriented X3, memory intact for recent and remote, normally interactive, good eye contact, and slightly anxious.     Impression & Recommendations:  Problem # 1:  PARESTHESIA (ICD-782.0) Normal exam. She does report that she sleeps with her wrists angled and median nerve compression is still a possibility. Her lower extremity sensation does suggest a mild neuropathy  Plan - r/o underling metabolic etiology  Orders: TLB-B12 + Folate Pnl (82746_82607-B12/FOL) TLB-TSH (Thyroid Stimulating Hormone) (84443-TSH)  Addendum - B12 654 - normal, TSH normal, glucose and electrolytes normal  Plan - for persistent discomfort can consider wrist splints or neuropathic medications.  Problem # 2:  UNSPECIFIED ANEMIA (ICD-285.9) Patient is concerned about anemia and recurrent internal bleeding.  Orders: TLB-CBC Platelet - w/Differential (85025-CBCD) TLB-IBC Pnl (Iron/FE;Transferrin) (83550-IBC)  Addendum - Hgb 14.3 - robust, Iron level is normal although iron saturation is minimally depressed at 17.5% (20-50 nl)  Plan - continue iron containing  vitamin.  Problem # 3:  HYPERTENSION (ICD-401.9) Reviwed medication and timing of administration. Reviewed adverse side effect profile of nifedipine and metoprolol.  Her updated medication list for this problem includes:    Nifediac Cc 30 Mg Tb24 (Nifedipine) .Marland Kitchen... Take one tablet daily    Lopressor 50 Mg Tabs (Metoprolol tartrate) .Marland Kitchen... Take 1 tablet by mouth two times a day  Orders: TLB-BMP (Basic Metabolic Panel-BMET) (80048-METABOL)  BP today: 148/82 Prior BP: 124/80 (02/02/2009)  BP is adequately controlled. Lab reveals normal electrolytes and renal function.  Plan - continue present medication.   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 Patient does need to continue on warfarin and follow up with the coag clinic. Some of feelings of lassitude may be medication related but that is the price to pay for prevention.  Her updated medication list for this problem includes:    Warfarin Sodium 5 Mg Tabs (Warfarin sodium) .Marland Kitchen... Take as directed by coumadin clinic.  Problem # 5:  DYSPEPSIA, HX OF (ICD-V12.79) Symptoms are fairly well controlled. She is advised to take omeprazole 40mg  first thing in the AM (2x20mg )  Complete Medication List: 1)  Nifediac Cc 30 Mg Tb24 (Nifedipine) .... Take one tablet daily 2)  Omeprazole 20 Mg Cpdr (Omeprazole) .... 2 once daily 3)  Hydrocodone-acetaminophen 7.5-750 Mg Tabs (Hydrocodone-acetaminophen) .Marland KitchenMarland KitchenMarland Kitchen  Take 1/2 tablet every 3 hours as needed 4)  Alprazolam 0.25 Mg Tabs (Alprazolam) .... Takes 1/2 tablet daily up to eight as needed 5)  Centrum Silver Tabs (Multiple vitamins-minerals) .... Take one tablet daily 6)  Lopressor 50 Mg Tabs (Metoprolol tartrate) .... Take 1 tablet by mouth two times a day 7)  Simvastatin 20 Mg Tabs (Simvastatin) .Marland Kitchen.. 1 by mouth q pm for cholesterol 8)  Warfarin Sodium 5 Mg Tabs (Warfarin sodium) .... Take as directed by coumadin clinic. 9)  Ipratropium Bromide 0.06 % Soln (Ipratropium bromide) .Marland Kitchen..  1-2 sprays each nostril up to 3 x daily as needed, before meals, for runny nose. 10)  Tramadol Hcl 50 Mg Tabs (Tramadol hcl) .Marland Kitchen.. 1 by mouth q 6 prn 11)  Gas-x 80 Mg Chew (Simethicone) .... Take as needed  Patient: Jacqueline [name] Note: All result statuses are Final unless otherwise noted.  Tests: (1) CBC Platelet w/Diff (CBCD)   White Cell Count          6.4 K/uL                    4.5-10.5   Red Cell Count            4.29 Mil/uL                 3.87-5.11   Hemoglobin                14.3 g/dL                   16.1-09.6   Hematocrit                41.6 %                      36.0-46.0   MCV                       96.9 fl                     78.0-100.0   MCHC                      34.3 g/dL                   04.5-40.9   RDW                       13.6 %                      11.5-14.6   Platelet Count            219.0 K/uL                  150.0-400.0   Neutrophil %              72.7 %                      43.0-77.0   Lymphocyte %              19.4 %                      12.0-46.0   Monocyte %                7.0 %  3.0-12.0   Eosinophils%              0.3 %                       0.0-5.0   Basophils %               0.6 %                       0.0-3.0   Neutrophill Absolute      4.8 K/uL                    1.4-7.7   Lymphocyte Absolute       1.2 K/uL                    0.7-4.0   Monocyte Absolute         0.4 K/uL                    0.1-1.0  Eosinophils, Absolute                             0.0 K/uL                    0.0-0.7   Basophils Absolute        0.0 K/uL                    0.0-0.1  Tests: (2) BMP (METABOL)   Sodium                    138 mEq/L                   135-145   Potassium                 4.6 mEq/L                   3.5-5.1   Chloride                  99 mEq/L                    96-112   Carbon Dioxide            32 mEq/L                    19-32   Glucose                   97 mg/dL                    16-10   BUN                       9 mg/dL                      9-60   Creatinine                0.7 mg/dL                   4.5-4.0   Calcium                   9.9 mg/dL  8.4-10.5   GFR                       85.83 mL/min                >60  Tests: (3) B12 + Folate Panel (B12/FOL)   Vitamin B12               654 pg/mL                   211-911   Folate                    >20.0 ng/mL     Deficient  0.4 - 3.4 ng/mL     Indeterminate  3.4 - 5.4 ng/mL     Normal  >5.4 ng/mL  Tests: (4) TSH (TSH)   FastTSH                   1.10 uIU/mL                 0.35-5.50  Tests: (5) IBC Panel (IBC)   Iron                      79 ug/dL                    16-109   Transferrin               322.8 mg/dL                 604.5-409.8   Iron Saturation      [L]  17.5 %                      20.0-50.0  Appended Document: FU /NWS    Clinical Lists Changes  Observations: Added new observation of COLONOSCOPY: historical (12/21/2004 9:14) Added new observation of BONE DENSITY: historical (11/28/2001 9:14)         Preventive Care Screening  Colonoscopy:    Date:  12/21/2004    Results:  historical  Bone Density:    Date:  11/28/2001    Results:  historical

## 2010-06-01 NOTE — Progress Notes (Signed)
Summary: REFILLS - MAIL ORDER  Phone Note Call from Patient Call back at Home Phone [phone]   Summary of Call: Patient is requesting a call back regarding her prescriptions.  Initial call taken by: Lamar Sprinkles, CMA,  March 12, 2010 3:19 PM  Follow-up for Phone Call        Patient is requesting refills of all meds to go to [facility]? Follow-up by: Lamar Sprinkles, CMA,  March 15, 2010 9:43 AM  Additional Follow-up for Phone Call Additional follow up Details #1::        Shelba Flake Additional Follow-up by: [name],  March 15, 2010 10:45 AM    Prescriptions: HYDROCODONE-ACETAMINOPHEN 7.5-750 MG TABS (HYDROCODONE-ACETAMINOPHEN) 1/2 tab every 3 hours as needed limit of 4 full doses/day (8 1/2 doses)  #360 x 1   Entered by:   Lamar Sprinkles, CMA   Authorized by:   Tresa Garter MD   Signed by:   Lamar Sprinkles, CMA on 03/15/2010   Method used:   Printed then faxed to ...       [facility]* (retail)       [address] [place], [place]  [address]       Ph: [phone]       Fax: [phone]   RxID:   [id number] ALPRAZOLAM 0.25 MG  TABS (ALPRAZOLAM) Takes 1/2 tablet daily up to eight as needed  #360 x 1   Entered by:   Lamar Sprinkles, CMA   Authorized by:   Tresa Garter MD   Signed by:   Lamar Sprinkles, CMA on 03/15/2010   Method used:   Printed then faxed to ...       [facility]* (retail)       [address] [place], [place]  [address]       Ph: [phone]       Fax: [phone]   RxID:   [id number] PLAVIX 75 MG TABS (CLOPIDOGREL BISULFATE) 1 by mouth once daily  #90 x 3   Entered by:   Lamar Sprinkles, CMA   Authorized by:   Tresa Garter MD   Signed by:   Lamar Sprinkles, CMA on 03/15/2010   Method used:   Electronically to        [facility]* (retail)       [address] [place], [place]  [address]       Ph: [phone]       Fax: [phone]   RxID:   [id number] TRAMADOL HCL 50 MG TABS (TRAMADOL  HCL) 1 by mouth q 6 prn  #300 x 3   Entered by:   Lamar Sprinkles, CMA   Authorized by:   Tresa Garter MD   Signed by:   Lamar Sprinkles, CMA on 03/15/2010   Method used:   Electronically to        [facility]* (retail)       [address] [place], [place]  [address]       Ph: [phone]       Fax: [phone]   RxID:   [id number] IPRATROPIUM BROMIDE 0.06 % SOLN (IPRATROPIUM BROMIDE) 1-2 sprays each nostril up to 3 x daily as needed, before meals, for runny nose.  #3 x 3   Entered by:   Lamar Sprinkles, CMA   Authorized by:   Macarthur Critchley  Buckner Malta MD   Signed by:   Lamar Sprinkles, CMA on 03/15/2010   Method used:   Electronically to        [facility]* (retail)       [address] [place], [place]  [address]       Ph: [phone]       Fax: [phone]   RxID:   [id number] SIMVASTATIN 20 MG TABS (SIMVASTATIN) 1 by mouth q PM for cholesterol  #90 x 3   Entered by:   Lamar Sprinkles, CMA   Authorized by:   Tresa Garter MD   Signed by:   Lamar Sprinkles, CMA on 03/15/2010   Method used:   Electronically to        [facility]* (retail)       [address] [place], [place]  [address]       Ph: [phone]       Fax: [phone]   RxID:   [id number] LOPRESSOR 50 MG TABS (METOPROLOL TARTRATE) Take 1 tablet by mouth once a day.  #90 x 3   Entered by:   Lamar Sprinkles, CMA   Authorized by:   Tresa Garter MD   Signed by:   Lamar Sprinkles, CMA on 03/15/2010   Method used:   Electronically to        [facility]* (retail)       [address] [place], [place]  [address]       Ph: [phone]       Fax: [phone]   RxID:   [id number] OMEPRAZOLE 20 MG  CPDR (OMEPRAZOLE) 2 once daily  #180 x 3   Entered by:   Lamar Sprinkles, CMA   Authorized by:   Tresa Garter MD   Signed by:   Lamar Sprinkles, CMA on 03/15/2010   Method used:   Electronically to        [facility] (retail)       [address] [place], [place]  [address]        Ph: [phone]       Fax: [phone]   RxID:   [id number] NIFEDIAC CC 30 MG  TB24 (NIFEDIPINE) Take one tablet daily  #90 x 3   Entered by:   Lamar Sprinkles, CMA   Authorized by:   Tresa Garter MD   Signed by:   Lamar Sprinkles, CMA on 03/15/2010   Method used:   Electronically to        [facility] (retail)       [address] [place], [place]  [address]       Ph: [phone]       Fax: [phone]   RxID:   [id number]

## 2010-06-01 NOTE — Progress Notes (Signed)
Summary: REFILL  Phone Note Call from Patient   Summary of Call: Patient is requesting refill of hydrocodone to go to [facility] for 3 mth supply. This was removed from EMR but patient says she still takes med and needs refill.  Initial call taken by: Lamar Sprinkles, CMA,  May 18, 2009 11:54 AM  Follow-up for Phone Call        Hydrocodone was never stopped: 7.5/750 1/2 tab q 3 hrs as needed with limit of 6 doses (3 full tabs) daily - #90/month.  OK to refill as requested. Med list updated.  Follow-up by: [name],  May 18, 2009 1:15 PM    New/Updated Medications: HYDROCODONE-ACETAMINOPHEN 7.5-750 MG TABS (HYDROCODONE-ACETAMINOPHEN) 1/2 tab every 3 hours as needed limit of 3 full doses/day (6 1/2 doses)

## 2010-06-01 NOTE — Assessment & Plan Note (Signed)
Summary: left leg/ankle redness & swelling / SD   Vital Signs:  Patient profile:   77 year old female Height:      62 inches Weight:      161.25 pounds BMI:     29.60 O2 Sat:      98 % on Room air Temp:     97.6 degrees F oral Pulse rate:   118 / minute Pulse rhythm:   regular BP sitting:   132 / 78  (left arm) Cuff size:   regular  Vitals Entered By: Brenton Grills (August 04, 2009 3:59 PM)  O2 Flow:  Room air CC: pt c/o swelling/redness in lower left leg and ankle/pt also states that inside of leg leg above the knee is also red and has swelling. pt states tha it feels "like pin pricks" a tingling feeling there/aj   Primary Care Provider:  Hezakiah Champeau  CC:  pt c/o swelling/redness in lower left leg and ankle/pt also states that inside of leg leg above the knee is also red and has swelling. pt states tha it feels "like pin pricks" a tingling feeling there/aj.  History of Present Illness: patient is seen acutely: she is concerned that discoloration of her distal leg might be ITTP. She also is concenred about continued fluctuation in her BP.   Her BP today is good and her home monitor read high compared to office BP cuff.  She is less drowsy and fatigued having reduced lopressor to single dose per day, taken in the evening.  Current Medications (verified): 1)  Nifediac Cc 30 Mg  Tb24 (Nifedipine) .... Take One Tablet Daily 2)  Omeprazole 20 Mg  Cpdr (Omeprazole) .... 2 Once Daily 3)  Alprazolam 0.25 Mg  Tabs (Alprazolam) .... Takes 1/2 Tablet Daily Up To Eight As Needed 4)  Centrum Silver   Tabs (Multiple Vitamins-Minerals) .... Take One Tablet Daily 5)  Lopressor 50 Mg Tabs (Metoprolol Tartrate) .... Take 1 Tablet By Mouth Once A Day. 6)  Simvastatin 20 Mg Tabs (Simvastatin) .Marland Kitchen.. 1 By Mouth Q Pm For Cholesterol 7)  Ipratropium Bromide 0.06 % Soln (Ipratropium Bromide) .Marland Kitchen.. 1-2 Sprays Each Nostril Up To 3 X Daily As Needed, Before Meals, For Runny Nose. 8)  Tramadol Hcl 50 Mg Tabs  (Tramadol Hcl) .Marland Kitchen.. 1 By Mouth Q 6 Prn 9)  Gas-X 80 Mg Chew (Simethicone) .... Take As Needed 10)  Hydrocodone-Acetaminophen 7.5-750 Mg Tabs (Hydrocodone-Acetaminophen) .... 1/2 Tab Every 3 Hours As Needed Limit of 4 Full Doses/day (8 1/2 Doses) 11)  Plavix 75 Mg Tabs (Clopidogrel Bisulfate) .Marland Kitchen.. 1 By Mouth Once Daily  Allergies (verified): 1)  ! Penicillin 2)  ! Ultram 3)  ! Asa 4)  ! Metoclopramide Hcl (Metoclopramide Hcl) PMH-FH-SH reviewed-no changes except otherwise noted  Review of Systems  The patient denies anorexia, fever, weight loss, weight gain, decreased hearing, chest pain, and prolonged cough.    Physical Exam  General:  Well-developed,well-nourished,in no acute distress; alert,appropriate and cooperative throughout examination Lungs:  normal respiratory effort and normal breath sounds.   Heart:  normal rate.   Skin:  superficial varicosities at ankles. Psych:  Oriented X3, memory intact for recent and remote, and moderately anxious.     Impression & Recommendations:  Problem # 1:  SKIN LESION (ICD-709.9) Reassured that she does not have TTP. Showed her google images of TTP  Problem # 2:  HYPERTENSION (ICD-401.9)  Her updated medication list for this problem includes:    Nifediac Cc 30 Mg  Tb24 (Nifedipine) .Marland Kitchen... Take one tablet daily    Lopressor 50 Mg Tabs (Metoprolol tartrate) .Marland Kitchen... Take 1 tablet by mouth once a day.  BP today: 132/78 Prior BP: 110/78 (07/29/2009)  GOOD CONTROL. Advised her to not monitor her BP more than once a day.  Complete Medication List: 1)  Nifediac Cc 30 Mg Tb24 (Nifedipine) .... Take one tablet daily 2)  Omeprazole 20 Mg Cpdr (Omeprazole) .... 2 once daily 3)  Alprazolam 0.25 Mg Tabs (Alprazolam) .... Takes 1/2 tablet daily up to eight as needed 4)  Centrum Silver Tabs (Multiple vitamins-minerals) .... Take one tablet daily 5)  Lopressor 50 Mg Tabs (Metoprolol tartrate) .... Take 1 tablet by mouth once a day. 6)  Simvastatin 20  Mg Tabs (Simvastatin) .Marland Kitchen.. 1 by mouth q pm for cholesterol 7)  Ipratropium Bromide 0.06 % Soln (Ipratropium bromide) .Marland Kitchen.. 1-2 sprays each nostril up to 3 x daily as needed, before meals, for runny nose. 8)  Tramadol Hcl 50 Mg Tabs (Tramadol hcl) .Marland Kitchen.. 1 by mouth q 6 prn 9)  Gas-x 80 Mg Chew (Simethicone) .... Take as needed 10)  Hydrocodone-acetaminophen 7.5-750 Mg Tabs (Hydrocodone-acetaminophen) .... 1/2 tab every 3 hours as needed limit of 4 full doses/day (8 1/2 doses) 11)  Plavix 75 Mg Tabs (Clopidogrel bisulfate) .Marland Kitchen.. 1 by mouth once daily

## 2010-06-01 NOTE — Progress Notes (Signed)
  Phone Note Refill Request Message from:  Fax from Pharmacy on Sep 25, 2009 1:50 PM  Refills Requested: Medication #1:  ALPRAZOLAM 0.25 MG  TABS Takes 1/2 tablet daily up to eight as needed   Last Refilled: 09/14/2009 [facility] [address], please advise refill  Initial call taken by: Ami Bullins CMA,  Sep 25, 2009 1:50 PM  Follow-up for Phone Call        refill called     Prescriptions: ALPRAZOLAM 0.25 MG  TABS (ALPRAZOLAM) Takes 1/2 tablet daily up to eight as needed  #120 x 3   Entered and Authorized by:   [name]   Signed by:   [name] on 09/25/2009   Method used:   Telephoned to ...       [facility] [address] (retail)       [address]       [place], [place]  [address]       Ph: [phone]       Fax: [phone]   RxID:   [id number]

## 2010-06-01 NOTE — Assessment & Plan Note (Signed)
Summary: DIZZINESS/ OCC ABDOMINAL PAIN X 1WK   Vital Signs:  Patient profile:   77 year old female Height:      62 inches (157.48 cm) Weight:      155 pounds (70.45 kg) BMI:     28.45 O2 Sat:      97 % on Room air Temp:     97.6 degrees F (36.44 degrees C) oral Pulse rate:   88 / minute BP sitting:   140 / 88  (left arm) Cuff size:   large  Vitals Entered By: Lucious Groves CMA (December 21, 2009 2:10 PM)  O2 Flow:  Room air CC: C/O occ. dizziness and occ. abd pain./kb Is Patient Diabetic? No Pain Assessment Patient in pain? no      Comments Patient denies being dizzy or in pain at this time. Lucious Groves CMA  December 21, 2009 2:11 PM    Primary Care Provider:  [facility]  CC:  C/O occ. dizziness and occ. abd pain./kb.  History of Present Illness: Patient presents with c/o dizziness. She reports that last Tuesday she had the sudden on-set of dizziness. Her symptoms resolved after she sat down. Since that event she has not felt quite right. She has not had a problem with recurrent dizziness but feels light-headed and it is hard to concentrate. No focal weakness, slurred speech, no paresthesia. She has been able to do all her usual ADLs.   Current Medications (verified): 1)  Nifediac Cc 30 Mg  Tb24 (Nifedipine) .... Take One Tablet Daily 2)  Omeprazole 20 Mg  Cpdr (Omeprazole) .... 2 Once Daily 3)  Alprazolam 0.25 Mg  Tabs (Alprazolam) .... Takes 1/2 Tablet Daily Up To Eight As Needed 4)  Centrum Silver   Tabs (Multiple Vitamins-Minerals) .... Take One Tablet Daily 5)  Lopressor 50 Mg Tabs (Metoprolol Tartrate) .... Take 1 Tablet By Mouth Once A Day. 6)  Simvastatin 20 Mg Tabs (Simvastatin) .Marland Kitchen.. 1 By Mouth Q Pm For Cholesterol 7)  Ipratropium Bromide 0.06 % Soln (Ipratropium Bromide) .Marland Kitchen.. 1-2 Sprays Each Nostril Up To 3 X Daily As Needed, Before Meals, For Runny Nose. 8)  Tramadol Hcl 50 Mg Tabs (Tramadol Hcl) .Marland Kitchen.. 1 By Mouth Q 6 Prn 9)  Gas-X 80 Mg Chew (Simethicone) .... Take As  Needed 10)  Hydrocodone-Acetaminophen 7.5-750 Mg Tabs (Hydrocodone-Acetaminophen) .... 1/2 Tab Every 3 Hours As Needed Limit of 4 Full Doses/day (8 1/2 Doses) 11)  Plavix 75 Mg Tabs (Clopidogrel Bisulfate) .Marland Kitchen.. 1 By Mouth Once Daily 12)  Clobetasol Propionate 0.05 % Crea (Clobetasol Propionate) .... Apply To  Rash On Left Foot Two Times A Day.  Allergies (verified): 1)  ! Penicillin 2)  ! Ultram 3)  ! Asa 4)  ! 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) ALLERGY (ICD-995.3) PAROXYSMAL NOCTURNAL DYSPNEA (ICD-786.09) OSTEOARTHRITIS, KNEES, BILATERAL (ICD-715.96) RESTRICTIVE LUNG DISEASE (ICD-518.89) UNSPECIFIED ANEMIA (ICD-285.9) UNS ADVRS EFF OTH RX MEDICINAL&BIOLOGICAL SBSTNC (ZOX-096.04) HEMATOCHEZIA (ICD-578.1) PARANOIA (ICD-297.9) DEGENERATIVE DISC DISEASE, LUMBAR SPINE (ICD-722.52) Hx of GI BLEEDING (ICD-578.9) DYSPEPSIA, HX OF (ICD-V12.79) GASTRIC ULCER, HX OF (ICD-V12.71) Hx of ANXIETY (ICD-300.00) IRRITABLE BOWEL SYNDROME, HX OF (ICD-V12.79) OSTEOARTHRITIS (ICD-715.90) HYPERTENSION (ICD-401.9) Fx coccyx- limited mobility      Past Surgical History: Last updated: 01/01/2009 * RIGHT SHOULDER SURGERY pilonidal cyst PSH reviewed for relevance, FH reviewed for relevance  Review of Systems       The patient complains of difficulty walking.  The patient denies anorexia, fever,  weight loss, weight gain, chest pain, syncope, dyspnea on exertion, headaches, abdominal pain, incontinence, and abnormal bleeding.         balance is uncertain Neuro:  Complains of difficulty with concentration and poor balance; denies brief paralysis, disturbances in coordination, falling down, headaches, inability to speak, memory loss, numbness, seizures, sensation of room spinning, tremors, and weakness.  Physical Exam  General:  WNWD white female in no distress alert, nicely groomed Head:   normocephalic and atraumatic.   Eyes:  vision grossly intact, pupils equal, and pupils round.   Neck:  supple.   Lungs:  normal respiratory effort and normal breath sounds.   Heart:  normal rate and regular rhythm.   Pulses:  2+ Neurologic:  alert & oriented X3, cranial nerves II-XII intact, strength normal in all extremities, and gait normal.  No tremor.  Skin:  turgor normal and color normal.   Psych:  Oriented X3, memory intact for recent and remote, normally interactive, and good eye contact.     Impression & Recommendations:  Problem # 1:  LABYRINTHITIS, ACUTE (ICD-386.30) By history and exam she most likely has labyrinthitis. Full explanation provided, CareNotes handout provided.  Plan - meclizine 12.5 mg q 6 as needed.           continue to use a walker as needed for balance - avoid falls           call for worsening symptoms.  Complete Medication List: 1)  Nifediac Cc 30 Mg Tb24 (Nifedipine) .... Take one tablet daily 2)  Omeprazole 20 Mg Cpdr (Omeprazole) .... 2 once daily 3)  Alprazolam 0.25 Mg Tabs (Alprazolam) .... Takes 1/2 tablet daily up to eight as needed 4)  Centrum Silver Tabs (Multiple vitamins-minerals) .... Take one tablet daily 5)  Lopressor 50 Mg Tabs (Metoprolol tartrate) .... Take 1 tablet by mouth once a day. 6)  Simvastatin 20 Mg Tabs (Simvastatin) .Marland Kitchen.. 1 by mouth q pm for cholesterol 7)  Ipratropium Bromide 0.06 % Soln (Ipratropium bromide) .Marland Kitchen.. 1-2 sprays each nostril up to 3 x daily as needed, before meals, for runny nose. 8)  Tramadol Hcl 50 Mg Tabs (Tramadol hcl) .Marland Kitchen.. 1 by mouth q 6 prn 9)  Gas-x 80 Mg Chew (Simethicone) .... Take as needed 10)  Hydrocodone-acetaminophen 7.5-750 Mg Tabs (Hydrocodone-acetaminophen) .... 1/2 tab every 3 hours as needed limit of 4 full doses/day (8 1/2 doses) 11)  Plavix 75 Mg Tabs (Clopidogrel bisulfate) .Marland Kitchen.. 1 by mouth once daily 12)  Clobetasol Propionate 0.05 % Crea (Clobetasol propionate) .... Apply to  rash on left  foot two times a day. 13)  Meclizine Hcl 12.5 Mg Tabs (Meclizine hcl) .Marland Kitchen.. 1 by mouth q6 hours for dysequilibrium associated with labyrinthitis. Prescriptions: MECLIZINE HCL 12.5 MG TABS (MECLIZINE HCL) 1 by mouth q6 hours for dysequilibrium associated with labyrinthitis.  #30 x 5   Entered and Authorized by:   [name]   Signed by:   [name] on 12/21/2009   Method used:   Electronically to        [facility] [address] (retail)       [address]       [place], [place]  [address]       Ph: [phone]       Fax: [phone]   RxID:   [id number]

## 2010-06-01 NOTE — Assessment & Plan Note (Signed)
Summary: stomach discomfort / SD   Vital Signs:  Patient profile:   77 year old female Height:      62 inches Weight:      159 pounds BMI:     29.19 O2 Sat:      97 % on Room air Temp:     98.2 degrees F oral Pulse rate:   84 / minute BP sitting:   136 / 72  (left arm) Cuff size:   large  Vitals Entered By: Bill Salinas CMA (June 15, 2009 2:17 PM)  O2 Flow:  Room air CC: pt c/o low abd cramps but states since she has started eatting before taking her coumadin that her symptoms are much better, she does admitt that symptoms are still present/ab   Primary Care Provider:  Shant Hence  CC:  pt c/o low abd cramps but states since she has started eatting before taking her coumadin that her symptoms are much better and she does admitt that symptoms are still present/ab.  History of Present Illness: She is having a terrible time with coumadin: she is having daily abdominal pain which she associates with coumadin. According to [facility] abdominal cramping and pain is associated with coumadin.   Current Medications (verified): 1)  Nifediac Cc 30 Mg  Tb24 (Nifedipine) .... Take One Tablet Daily 2)  Omeprazole 20 Mg  Cpdr (Omeprazole) .... 2 Once Daily 3)  Alprazolam 0.25 Mg  Tabs (Alprazolam) .... Takes 1/2 Tablet Daily Up To Eight As Needed 4)  Centrum Silver   Tabs (Multiple Vitamins-Minerals) .... Take One Tablet Daily 5)  Lopressor 50 Mg Tabs (Metoprolol Tartrate) .... Take 1 Tablet By Mouth Two Times A Day 6)  Simvastatin 20 Mg Tabs (Simvastatin) .Marland Kitchen.. 1 By Mouth Q Pm For Cholesterol 7)  Warfarin Sodium 5 Mg Tabs (Warfarin Sodium) .... Take As Directed By Coumadin Clinic. 8)  Ipratropium Bromide 0.06 % Soln (Ipratropium Bromide) .Marland Kitchen.. 1-2 Sprays Each Nostril Up To 3 X Daily As Needed, Before Meals, For Runny Nose. 9)  Tramadol Hcl 50 Mg Tabs (Tramadol Hcl) .Marland Kitchen.. 1 By Mouth Q 6 Prn 10)  Gas-X 80 Mg Chew (Simethicone) .... Take As Needed 11)  Hydrocodone-Acetaminophen 7.5-750 Mg Tabs  (Hydrocodone-Acetaminophen) .... 1/2 Tab Every 3 Hours As Needed Limit of 3 Full Doses/day (6 1/2 Doses)  Allergies (verified): 1)  ! Penicillin 2)  ! Ultram 3)  ! Asa 4)  ! 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) ALLERGY (ICD-995.3) PAROXYSMAL NOCTURNAL DYSPNEA (ICD-786.09) OSTEOARTHRITIS, KNEES, BILATERAL (ICD-715.96) RESTRICTIVE LUNG DISEASE (ICD-518.89) UNSPECIFIED ANEMIA (ICD-285.9) UNS ADVRS EFF OTH RX MEDICINAL&BIOLOGICAL SBSTNC (NFA-213.08) HEMATOCHEZIA (ICD-578.1) PARANOIA (ICD-297.9) DEGENERATIVE DISC DISEASE, LUMBAR SPINE (ICD-722.52) Hx of GI BLEEDING (ICD-578.9) DYSPEPSIA, HX OF (ICD-V12.79) GASTRIC ULCER, HX OF (ICD-V12.71) Hx of ANXIETY (ICD-300.00) IRRITABLE BOWEL SYNDROME, HX OF (ICD-V12.79) OSTEOARTHRITIS (ICD-715.90) HYPERTENSION (ICD-401.9) Fx coccyx- limited mobility      Past Surgical History: Last updated: January 21, 2009 * RIGHT SHOULDER SURGERY pilonidal cyst  Family History: Last updated: Jan 21, 2009 Father- died age 76, CVA Mother- died age 6, CVA, DM  Social History: Last updated: 2009/01/21 HSG Married: '48- 8 yrs/ divorced; married  '60 - '88/ widowed 2 daughters - '50, '62; 2 sons - ''64, '65;  4 grandchildren lives alone - independent ADLs- pet cockatiel Patient states former smoker. -2004  Risk Factors: Exercise: no (05/26/2008)  Risk Factors: Smoking Status: quit (01-21-2009) Passive Smoke Exposure: no (05/26/2008)  Physical Exam  General:  alert, well-developed, and well-nourished.   Head:  normocephalic and atraumatic.   Eyes:  vision grossly intact, pupils equal, pupils round, and corneas and lenses clear.   Neck:  full ROM.   Lungs:  normal respiratory effort.   Heart:  normal rate, regular rhythm, and no murmur.   Msk:  normal ROM, no joint tenderness, no joint swelling, and no joint warmth.   Pulses:  2+ Neurologic:   alert & oriented X3, cranial nerves II-XII intact, strength normal in all extremities, and gait normal.   Skin:  turgor normal, color normal, no rashes, and no ulcerations.   Psych:  Oriented X3, memory intact for recent and remote, normally interactive, and good eye contact.     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 In the absence of a valuvular cause she may do just as well taking plavix alone, with no advantage being reported with the addition of ASA.  Plan - start plavix 75mg  daily           stop coumadin.   The following medications were removed from the medication list:    Warfarin Sodium 5 Mg Tabs (Warfarin sodium) .Marland Kitchen... Take as directed by coumadin clinic. Her updated medication list for this problem includes:    Plavix 75 Mg Tabs (Clopidogrel bisulfate) .Marland Kitchen... 1 by mouth once daily  Complete Medication List: 1)  Nifediac Cc 30 Mg Tb24 (Nifedipine) .... Take one tablet daily 2)  Omeprazole 20 Mg Cpdr (Omeprazole) .... 2 once daily 3)  Alprazolam 0.25 Mg Tabs (Alprazolam) .... Takes 1/2 tablet daily up to eight as needed 4)  Centrum Silver Tabs (Multiple vitamins-minerals) .... Take one tablet daily 5)  Lopressor 50 Mg Tabs (Metoprolol tartrate) .... Take 1 tablet by mouth two times a day 6)  Simvastatin 20 Mg Tabs (Simvastatin) .Marland Kitchen.. 1 by mouth q pm for cholesterol 7)  Ipratropium Bromide 0.06 % Soln (Ipratropium bromide) .Marland Kitchen.. 1-2 sprays each nostril up to 3 x daily as needed, before meals, for runny nose. 8)  Tramadol Hcl 50 Mg Tabs (Tramadol hcl) .Marland Kitchen.. 1 by mouth q 6 prn 9)  Gas-x 80 Mg Chew (Simethicone) .... Take as needed 10)  Hydrocodone-acetaminophen 7.5-750 Mg Tabs (Hydrocodone-acetaminophen) .... 1/2 tab every 3 hours as needed limit of 3 full doses/day (6 1/2 doses) 11)  Plavix 75 Mg Tabs (Clopidogrel bisulfate) .Marland Kitchen.. 1 by mouth  once daily Prescriptions: PLAVIX 75 MG TABS (CLOPIDOGREL BISULFATE) 1 by mouth once daily  #30 x 3   Entered and Authorized by:   [name]   Signed by:   [name] on 06/15/2009   Method used:   Electronically to        [facility] (retail)       [address]       [place], [place]  [address]       Ph: [phone]       Fax: [phone]   RxID:   [id number]

## 2010-06-01 NOTE — Progress Notes (Signed)
Summary: REQ RFs  Phone Note Refill Request   Refills Requested: Medication #1:  ALPRAZOLAM 0.25 MG  TABS Takes 1/2 tablet daily up to eight as needed  Medication #2:  TRAMADOL HCL 50 MG TABS 1 by mouth q 6 prn  Medication #3:  HYDROCODONE-ACETAMINOPHEN 7.5-750 MG TABS 1/2 tab every 3 hours as needed limit of 3 full doses/day (6 1/2 doses)   Notes: req #120 Req refills' to go to [facility] OK?   Initial call taken by: Lamar Sprinkles, CMA,  June 22, 2009 8:36 AM  Follow-up for Phone Call        OK Follow-up by: [name],  June 22, 2009 8:53 AM  Additional Follow-up for Phone Call Additional follow up Details #1::        What qty is ok for tramadol?  Additional Follow-up by: Lamar Sprinkles, CMA,  June 22, 2009 11:06 AM    Additional Follow-up for Phone Call Additional follow up Details #2::    #100 Follow-up by: [name],  June 22, 2009 4:03 PM  Prescriptions: HYDROCODONE-ACETAMINOPHEN 7.5-750 MG TABS (HYDROCODONE-ACETAMINOPHEN) 1/2 tab every 3 hours as needed limit of 3 full doses/day (6 1/2 doses)  #120 x 0   Entered by:   Lamar Sprinkles, CMA   Authorized by:   [name]   Signed by:   Lamar Sprinkles, CMA on 06/22/2009   Method used:   Telephoned to ...       [facility] (retail)       [address]       [place], [place]  [address]       Ph: [phone]       Fax: [phone]   RxID:   [id number] ALPRAZOLAM 0.25 MG  TABS (ALPRAZOLAM) Takes 1/2 tablet daily up to eight as needed  #120 x 3   Entered by:   Lamar Sprinkles, CMA   Authorized by:   [name]   Signed by:   Lamar Sprinkles, CMA on 06/22/2009   Method used:   Telephoned to ...       [facility] (retail)       [address]       [place], [place]  [address]       Ph: [phone]       Fax: [phone]   RxID:   [id number] TRAMADOL HCL  50 MG TABS (TRAMADOL HCL) 1 by mouth q 6 prn  #100 x 1   Entered by:   Lamar Sprinkles, CMA   Authorized by:   [name]   Signed by:   Lamar Sprinkles, CMA on 06/22/2009   Method used:   Electronically to        [facility] (retail)       [address]       [place], [place]  [address]       Ph: [phone]       Fax: [phone]   RxID:   [id number] LOPRESSOR 50 MG TABS (METOPROLOL TARTRATE) Take 1 tablet by mouth two times a day  #180 x 1   Entered by:   Lamar Sprinkles, CMA   Authorized by:   [name]   Signed by:  Lamar Sprinkles, CMA on 06/22/2009   Method used:   Electronically to        [facility] [address] (retail)       [address]       [place], [place]  [address]       Ph: [phone]       Fax: [phone]   RxID:   [id number] SIMVASTATIN 20 MG TABS (SIMVASTATIN) 1 by mouth q PM for cholesterol  #90 x 1   Entered by:   Lamar Sprinkles, CMA   Authorized by:   [name]   Signed by:   Lamar Sprinkles, CMA on 06/22/2009   Method used:   Electronically to        [facility] (retail)       [address]       [place], [place]  [address]       Ph: [phone]       Fax: [phone]   RxID:   [id number]

## 2010-06-03 NOTE — Letter (Signed)
[facility] [address] [place], [place]  [address] Phone: [phone]      May 07, 2010   Jacqueline [name] [address] [place], [place] [address]  RE:  LAB RESULTS  Dear  Ms. Melle,  The following is an interpretation of your most recent lab tests.  Please take note of any instructions provided or changes to medications that have resulted from your lab work.  ELECTROLYTES:  Good - no changes needed  KIDNEY FUNCTION TESTS:  Good - no changes needed, Stable - no changes needed  LIVER FUNCTION TESTS:  Good - no changes needed  Health professionals look at cholesterol as more involved than just the total cholesterol. We consider the level of LDL (bad) cholesterol, HDL (good), cholesterol, and Triglycerides (Grease) in the blood.  1. Your LDL should be under 100, and the HDL should be over 45, if you have any vascular disease such as heart attack, angina, stroke, TIA (mini stroke), claudication (pain in the legs when you walk due to poor circulation),  Abdominal Aortic Aneurysm (AAA), diabetes or prediabetes.  2. Your LDL should be under 130 if you have any two of the following:     a. Smoke or chew tobacco,     b. High blood pressure (if you are on medication or over 140/90 without medication),     c. Female gender,    d. HDL below 40,    e. A female relative (father, brother, or son), who have had any vascular event          as described in #1. above under the age of 26, or a female relative (mother,       sister, or daughter) who had an event as described above under age 19. (An HDL over 60 will subtract one risk factor from the total, so if you have two items in # 2 above, but an HDL over 60, you then fall into category # 3 below).  3. Your LDL should be under 160 if you have any one of the above.  Triglycerides should be under 200 with the ideal being under 150.  For diabetes or pre-diabetes, the ideal HgbA1C should be under 6.0%.  If you fall into  any of the above categories, you should make a follow up appointment to discuss this with your physician.  LIPID PANEL:  Good - no changes needed Triglyceride: 98   Cholesterol: 132   LDL: 63   HDL: 49.2   Chol/HDL%:  2.7 CALC  THYROID STUDIES:  Thyroid studies normal TSH: 2.02     DIABETIC STUDIES:  Excellent - no changes needed Blood Glucose: 92   HgbA1C: 5.4     CBC:  Good - no changes needed  Lab results are normal.   Happy New Year   Sincerely Yours,    [name]  Patient: BRIANNIE [name] Note: All result statuses are Final unless otherwise noted.  Tests: (1) B12 + Folate Panel (B12/FOL)   Vitamin B12               607 pg/mL                   211-911   Folate                    >20.0 ng/mL     Deficient  0.4 - 3.4 ng/mL     Indeterminate  3.4 - 5.4 ng/mL  Normal  >5.4 ng/mL  Tests: (2) CBC Platelet w/Diff (CBCD)   White Cell Count          5.5 K/uL                    4.5-10.5   Red Cell Count            4.37 Mil/uL                 3.87-5.11   Hemoglobin                14.6 g/dL                   54.0-98.1   Hematocrit                42.2 %                      36.0-46.0   MCV                       96.6 fl                     78.0-100.0   MCHC                      34.6 g/dL                   19.1-47.8   RDW                       13.6 %                      11.5-14.6   Platelet Count            233.0 K/uL                  150.0-400.0   Neutrophil %              66.8 %                      43.0-77.0   Lymphocyte %              24.3 %                      12.0-46.0   Monocyte %                7.8 %                       3.0-12.0   Eosinophils%              0.7 %                       0.0-5.0   Basophils %               0.4 %                       0.0-3.0   Neutrophill Absolute      3.7 K/uL                    1.4-7.7   Lymphocyte Absolute       1.3 K/uL  0.7-4.0   Monocyte Absolute         0.4 K/uL                    0.1-1.0  Eosinophils,  Absolute                             0.0 K/uL                    0.0-0.7   Basophils Absolute        0.0 K/uL                    0.0-0.1  Tests: (3) IBC Panel (IBC)   Iron                      87 ug/dL                    29-562   Transferrin               343.9 mg/dL                 130.8-657.8   Iron Saturation      [L]  18.1 %                      20.0-50.0  Tests: (4) BMP (METABOL)   Sodium               [L]  134 mEq/L                   135-145   Potassium                 4.4 mEq/L                   3.5-5.1   Chloride                  96 mEq/L                    96-112   Carbon Dioxide            30 mEq/L                    19-32   Glucose                   92 mg/dL                    46-96   BUN                       12 mg/dL                    2-95   Creatinine                0.6 mg/dL                   2.8-4.1   Calcium                   9.5 mg/dL                   3.2-44.0   GFR  102.28 mL/min               >60.00  Tests: (5) TSH (TSH)   FastTSH                   2.02 uIU/mL                 0.35-5.50

## 2010-06-03 NOTE — Progress Notes (Signed)
SummaryPrudy [name] CORRECTION  Phone Note Call from Patient   Summary of Call: Pt left vm, [facility] canceled [facility] for alp[facility]olam. Need to call [facility] and call in rx. See scanned document from [facility] regarding correction of directions.  Initial call taken by: Lamar Sprinkles, CMA,  April 12, 2010 9:25 AM  Follow-up for Phone Call        Called & corrected, pt's phone # busy.  Follow-up by: Lamar Sprinkles, CMA,  April 13, 2010 9:01 AM  Additional Follow-up for Phone Call Additional follow up Details #1::        Pt informed  Additional Follow-up by: Lamar Sprinkles, CMA,  April 13, 2010 12:50 PM    New/Updated Medications: ALPRAZOLAM 0.25 MG  TABS (ALPRAZOLAM) Takes 1/2 tablet every 6 hours prn up to eight as needed Prescriptions: ALPRAZOLAM 0.25 MG  TABS (ALPRAZOLAM) Takes 1/2 tablet every 6 hours prn up to eight as needed  #360 x 1   Entered by:   Lamar Sprinkles, CMA   Authorized by:   [name]   Signed by:   Lamar Sprinkles, CMA on 04/13/2010   Method used:   Telephoned to ...       [facility] (retail)       [address] [place], [place]  [address]       Ph: [phone]       Fax: [phone]   RxID:   [id number]

## 2010-06-03 NOTE — Assessment & Plan Note (Signed)
Summary: f/u hiatal hernia/#/cd   Vital Signs:  Patient profile:   77 year old female Height:      62 inches Weight:      153 pounds BMI:     28.09 O2 Sat:      96 % on Room air Temp:     98.0 degrees F oral Pulse rate:   94 / minute BP sitting:   138 / 82  (left arm) Cuff size:   regular  Vitals Entered By: Bill Salinas CMA (May 06, 2010 3:07 PM)  O2 Flow:  Room air CC: pt here to discuss issues with her hiatal hernia. she also states she is no longer taking simvastatin/ ab   Primary Care Provider:  Joya Willmott  CC:  pt here to discuss issues with her hiatal hernia. she also states she is no longer taking simvastatin/ ab.  History of Present Illness: Patient presents to discuss her medications. She has been medically stable and doing well with no specific complaints.   Her pharmacy plan has changed her from nifediac ER to nifedipine XR at the same dose. She is reassured that this is the same medications.   She is concerned that the MD name on her Prescriptions is Dr. Posey Rea. The date of refills was Nov 14th and they were authorized by Dr. Roena Malady. I explained to her that this was simply cross cover. Her hydoroconde prescription needed to be reissued due to need for clarification on instructions.   She has some concern about her bilateral lower abdomen hernias. They remain small and not painful. She also has h/o hiatal hernia. The difference between the two was explained to her.   Current Medications (verified): 1)  Nifediac Cc 30 Mg  Tb24 (Nifedipine) .... Take One Tablet Daily 2)  Omeprazole 20 Mg  Cpdr (Omeprazole) .... 2 Once Daily 3)  Alprazolam 0.25 Mg  Tabs (Alprazolam) .... Takes 1/2 Tablet Every 6 Hours Prn Up To Eight As Needed 4)  Centrum Silver   Tabs (Multiple Vitamins-Minerals) .... Take One Tablet Daily 5)  Lopressor 50 Mg Tabs (Metoprolol Tartrate) .... Take 1 Tablet By Mouth Once A Day. 6)  Simvastatin 20 Mg Tabs (Simvastatin) .Marland Kitchen.. 1 By Mouth Q Pm For Cholesterol  -Hold 03/29/10 7)  Ipratropium Bromide 0.06 % Soln (Ipratropium Bromide) .Marland Kitchen.. 1-2 Sprays Each Nostril Up To 3 X Daily As Needed, Before Meals, For Runny Nose. 8)  Tramadol Hcl 50 Mg Tabs (Tramadol Hcl) .Marland Kitchen.. 1 By Mouth Q 6 Prn 9)  Gas-X 80 Mg Chew (Simethicone) .... Take As Needed 10)  Hydrocodone-Acetaminophen 7.5-750 Mg Tabs (Hydrocodone-Acetaminophen) .... 1/2 Tab Every 3 Hours As Needed Limit of 4 Full Doses/day (8 1/2 Doses) 11)  Plavix 75 Mg Tabs (Clopidogrel Bisulfate) .Marland Kitchen.. 1 By Mouth Once Daily 12)  Clobetasol Propionate 0.05 % Crea (Clobetasol Propionate) .... Apply To  Rash On Left Foot Two Times A Day. 13)  Meclizine Hcl 12.5 Mg Tabs (Meclizine Hcl) .Marland Kitchen.. 1 By Mouth Q6 Hours For Dysequilibrium Associated With Labyrinthitis.  Allergies (verified): 1)  ! Penicillin 2)  ! Ultram 3)  ! Asa 4)  ! 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) ALLERGY (ICD-995.3) PAROXYSMAL NOCTURNAL DYSPNEA (ICD-786.09) OSTEOARTHRITIS, KNEES, BILATERAL (ICD-715.96) RESTRICTIVE LUNG DISEASE (ICD-518.89) UNSPECIFIED ANEMIA (ICD-285.9) UNS ADVRS EFF OTH RX MEDICINAL&BIOLOGICAL SBSTNC (XTG-626.94) HEMATOCHEZIA (ICD-578.1) PARANOIA (ICD-297.9) DEGENERATIVE DISC DISEASE, LUMBAR SPINE (ICD-722.52) Hx of GI BLEEDING (ICD-578.9) DYSPEPSIA, HX OF (ICD-V12.79) GASTRIC ULCER, HX  OF (ICD-V12.71) Hx of ANXIETY (ICD-300.00) IRRITABLE BOWEL SYNDROME, HX OF (ICD-V12.79) OSTEOARTHRITIS (ICD-715.90) HYPERTENSION (ICD-401.9) Fx coccyx- limited mobility      Past Surgical History: Last updated: 01/01/2009 * RIGHT SHOULDER SURGERY pilonidal cyst  Social History: Last updated: 01/01/2009 HSG Married: '48- 8 yrs/ divorced; married  '60 - '88/ widowed 2 daughters - '50, '62; 2 sons - ''64, '65;  4 grandchildren lives alone - independent ADLs- pet cockatiel Patient states former smoker. -2004  Review of  Systems  The patient denies anorexia, fever, weight loss, weight gain, decreased hearing, chest pain, dyspnea on exertion, peripheral edema, hemoptysis, abdominal pain, muscle weakness, difficulty walking, unusual weight change, abnormal bleeding, and angioedema.    Physical Exam  General:  alert, well-developed, and well-nourished.   Head:  normocephalic and no abnormalities observed.   Eyes:  pupils equal and pupils round.   Lungs:  normal respiratory effort and normal breath sounds.   Heart:  normal rate and regular rhythm.   Abdomen:  soft and normal bowel sounds.   Msk:  normal ROM and no joint warmth.   Pulses:  2+ Neurologic:  alert & oriented X3, cranial nerves II-XII intact, and strength normal in all extremities.     Impression & Recommendations:  Problem # 1:  ABDOMINAL WALL HERNIA (ICD-553.20) stable with no enlargement, no tenderness/pain.  Plan - continue observation  Problem # 2:  HYPERTENSION (ICD-401.9)  Her updated medication list for this problem includes:    Nifediac Cc 30 Mg Tb24 (Nifedipine) .Marland Kitchen... Take one tablet daily    Lopressor 50 Mg Tabs (Metoprolol tartrate) .Marland Kitchen... Take 1 tablet by mouth once a day.  Orders: TLB-BMP (Basic Metabolic Panel-BMET) (80048-METABOL)  BP today: 138/82 Prior BP: 124/62 (01/21/2010)  Labs Reviewed: K+: 4.6 (05/04/2009) Creat: : 0.7 (05/04/2009)   Stable Bp and she will continue on her present medications.   Problem # 3:  UNSPECIFIED ANEMIA (ICD-285.9) Patient has a h/o GI bleed with resulting anemia. she has been doing well with no signs of recurrent bleeding.  Plan - CBC and iron panel.  Orders: TLB-CBC Platelet - w/Differential (85025-CBCD) TLB-IBC Pnl (Iron/FE;Transferrin) (83550-IBC)  Addendum - Hgb 14.6 and iron is very close to normal.  Problem # 4:  PARESTHESIA (ICD-782.0) Patinet with tingling in the 4th &5th digit left hand. ON exam - no decreased ROM, sensation in the left UE/hand  Plan - lab to r/o  B12 deficiency or decreased thyroid function   Orders: TLB-B12 + Folate Pnl (60454_09811-B14/NWG) TLB-TSH (Thyroid Stimulating Hormone) (84443-TSH)  Addendum - B12 and TSH normal  Complete Medication List: 1)  Nifediac Cc 30 Mg Tb24 (Nifedipine) .... Take one tablet daily 2)  Omeprazole 20 Mg Cpdr (Omeprazole) .... 2 once daily 3)  Alprazolam 0.25 Mg Tabs (Alprazolam) .... Takes 1/2 tablet every 6 hours prn up to eight as needed 4)  Centrum Silver Tabs (Multiple vitamins-minerals) .... Take one tablet daily 5)  Lopressor 50 Mg Tabs (Metoprolol tartrate) .... Take 1 tablet by mouth once a day. 6)  Simvastatin 20 Mg Tabs (Simvastatin) .Marland Kitchen.. 1 by mouth q pm for cholesterol -hold 03/29/10 7)  Ipratropium Bromide 0.06 % Soln (Ipratropium bromide) .Marland Kitchen.. 1-2 sprays each nostril up to 3 x daily as needed, before meals, for runny nose. 8)  Tramadol Hcl 50 Mg Tabs (Tramadol hcl) .Marland Kitchen.. 1 by mouth q 6 prn 9)  Gas-x 80 Mg Chew (Simethicone) .... Take as needed 10)  Hydrocodone-acetaminophen 7.5-750 Mg Tabs (Hydrocodone-acetaminophen) .... 1/2 tab every 3 hours  as needed limit of 4 full doses/day (8 1/2 doses) 11)  Plavix 75 Mg Tabs (Clopidogrel bisulfate) .Marland Kitchen.. 1 by mouth once daily 12)  Clobetasol Propionate 0.05 % Crea (Clobetasol propionate) .... Apply to  rash on left foot two times a day. 13)  Meclizine Hcl 12.5 Mg Tabs (Meclizine hcl) .Marland Kitchen.. 1 by mouth q6 hours for dysequilibrium associated with labyrinthitis. Prescriptions: OMEPRAZOLE 20 MG  CPDR (OMEPRAZOLE) 2 once daily  #180 x 3   Entered and Authorized by:   [name]   Signed by:   [name] on 05/07/2010   Method used:   Electronically to        [facility] [address] (retail)       [address]       [place], [place]  [address]       Ph: [phone]       Fax: [phone]   RxID:   [id number]    Orders Added: 1)  TLB-B12 + Folate Pnl [[id number]-B12/FOL] 2)  TLB-CBC  Platelet - w/Differential [85025-CBCD] 3)  TLB-IBC Pnl (Iron/FE;Transferrin) [83550-IBC] 4)  TLB-BMP (Basic Metabolic Panel-BMET) [80048-METABOL] 5)  TLB-TSH (Thyroid Stimulating Hormone) [84443-TSH] 6)  Est. Patient Level IV [40102]

## 2010-08-08 LAB — PROTIME-INR
INR: 0.9 (ref 0.00–1.49)
INR: 1.1 (ref 0.00–1.49)
INR: 0.9 (ref 0.00–1.49)
Prothrombin Time: 12.7 seconds (ref 11.6–15.2)
Prothrombin Time: 14.1 seconds (ref 11.6–15.2)
Prothrombin Time: 12.4 seconds (ref 11.6–15.2)

## 2010-08-08 LAB — DIFFERENTIAL
Basophils Absolute: 0 10*3/uL (ref 0.0–0.1)
Basophils Absolute: 0 10*3/uL (ref 0.0–0.1)
Basophils Absolute: 0 10*3/uL (ref 0.0–0.1)
Basophils Relative: 0 % (ref 0–1)
Basophils Relative: 1 % (ref 0–1)
Basophils Relative: 0 % (ref 0–1)
Eosinophils Absolute: 0.1 10*3/uL (ref 0.0–0.7)
Eosinophils Absolute: 0.1 10*3/uL (ref 0.0–0.7)
Eosinophils Relative: 2 % (ref 0–5)
Eosinophils Relative: 1 % (ref 0–5)
Lymphocytes Relative: 19 % (ref 12–46)
Lymphocytes Relative: 24 % (ref 12–46)
Lymphs Abs: 1.7 10*3/uL (ref 0.7–4.0)
Monocytes Absolute: 0.4 10*3/uL (ref 0.1–1.0)
Monocytes Relative: 6 % (ref 3–12)
Neutro Abs: 4.4 10*3/uL (ref 1.7–7.7)
Neutro Abs: 4.6 10*3/uL (ref 1.7–7.7)
Neutrophils Relative %: 67 % (ref 43–77)
Neutrophils Relative %: 70 % (ref 43–77)
Neutrophils Relative %: 68 % (ref 43–77)

## 2010-08-08 LAB — CBC
HCT: 41.2 % (ref 36.0–46.0)
Hemoglobin: 13.6 g/dL (ref 12.0–15.0)
Hemoglobin: 14.1 g/dL (ref 12.0–15.0)
MCHC: 33 g/dL (ref 30.0–36.0)
MCHC: 34.1 g/dL (ref 30.0–36.0)
MCV: 100 fL (ref 78.0–100.0)
Platelets: 181 10*3/uL (ref 150–400)
Platelets: 210 10*3/uL (ref 150–400)
RBC: 4.07 MIL/uL (ref 3.87–5.11)
RBC: 4.1 MIL/uL (ref 3.87–5.11)
RDW: 14.4 % (ref 11.5–15.5)
RDW: 14.6 % (ref 11.5–15.5)
WBC: 6.9 10*3/uL (ref 4.0–10.5)
WBC: 6.7 10*3/uL (ref 4.0–10.5)

## 2010-08-08 LAB — COMPREHENSIVE METABOLIC PANEL
ALT: 28 U/L (ref 0–35)
ALT: 29 U/L (ref 0–35)
AST: 31 U/L (ref 0–37)
AST: 45 U/L — ABNORMAL HIGH (ref 0–37)
Albumin: 3.6 g/dL (ref 3.5–5.2)
Alkaline Phosphatase: 58 U/L (ref 39–117)
Alkaline Phosphatase: 58 U/L (ref 39–117)
BUN: 17 mg/dL (ref 6–23)
CO2: 27 mEq/L (ref 19–32)
CO2: 31 mEq/L (ref 19–32)
Calcium: 9.3 mg/dL (ref 8.4–10.5)
Calcium: 9.6 mg/dL (ref 8.4–10.5)
Chloride: 103 mEq/L (ref 96–112)
Chloride: 103 mEq/L (ref 96–112)
Creatinine, Ser: 0.86 mg/dL (ref 0.4–1.2)
GFR calc Af Amer: >60 mL/min (ref >60)
GFR calc Af Amer: >60 mL/min (ref >60)
GFR calc non Af Amer: >60 mL/min (ref >60)
GFR calc non Af Amer: >60 mL/min (ref >60)
Glucose, Bld: 125 mg/dL — ABNORMAL HIGH (ref 70–99)
Glucose, Bld: 97 mg/dL (ref 70–99)
Potassium: 4.1 mEq/L (ref 3.5–5.1)
Potassium: 4.2 mEq/L (ref 3.5–5.1)
Potassium: 4.6 mEq/L (ref 3.5–5.1)
Sodium: 140 mEq/L (ref 135–145)
Total Bilirubin: 0.5 mg/dL (ref 0.3–1.2)
Total Bilirubin: 0.8 mg/dL (ref 0.3–1.2)
Total Protein: 6.4 g/dL (ref 6.0–8.3)

## 2010-08-08 LAB — URINALYSIS, ROUTINE W REFLEX MICROSCOPIC
Nitrite: NEGATIVE
Protein, ur: NEGATIVE mg/dL
Urobilinogen, UA: 0.2 mg/dL (ref 0.0–1.0)

## 2010-08-08 LAB — APTT: aPTT: 33 seconds (ref 24–37)

## 2010-08-08 LAB — BASIC METABOLIC PANEL
BUN: 15 mg/dL (ref 6–23)
Calcium: 9.6 mg/dL (ref 8.4–10.5)
Creatinine, Ser: 0.75 mg/dL (ref 0.4–1.2)
GFR calc non Af Amer: >60 mL/min (ref >60)
Glucose, Bld: 112 mg/dL — ABNORMAL HIGH (ref 70–99)
Sodium: 142 mEq/L (ref 135–145)

## 2010-08-08 LAB — TROPONIN I: Troponin I: 0.02 ng/mL (ref 0.00–0.06)

## 2010-08-08 LAB — SEDIMENTATION RATE: Sed Rate: 17 mm/hr (ref 0–22)

## 2010-08-08 LAB — LIPID PANEL
Cholesterol: 137 mg/dL (ref 0–200)
HDL: 63 mg/dL (ref >39)
Triglycerides: 50 mg/dL (ref <150)

## 2010-08-26 ENCOUNTER — Telehealth: Payer: Self-pay | Admitting: *Deleted

## 2010-08-26 DIAGNOSIS — I10 Essential (primary) hypertension: Secondary | ICD-10-CM

## 2010-08-26 DIAGNOSIS — D649 Anemia, unspecified: Secondary | ICD-10-CM

## 2010-08-26 NOTE — Telephone Encounter (Signed)
OK for lab: Bmet, CBC - orders entered

## 2010-08-26 NOTE — Telephone Encounter (Signed)
Pt's bp at the highest has been 140/81 and other readings are in the 110's - 120's over 60's-70's. She had an episode of feeling woozy and used otc med for dizzyness. She is req order for labs to see if "all is ok".

## 2010-08-27 NOTE — Telephone Encounter (Signed)
Patient informed, meclizine has helped and she will come for labs and/or OV is symptoms return

## 2010-09-09 ENCOUNTER — Other Ambulatory Visit (INDEPENDENT_AMBULATORY_CARE_PROVIDER_SITE_OTHER): Payer: Medicare Other

## 2010-09-09 DIAGNOSIS — D649 Anemia, unspecified: Secondary | ICD-10-CM

## 2010-09-09 DIAGNOSIS — I10 Essential (primary) hypertension: Secondary | ICD-10-CM

## 2010-09-09 LAB — COMPREHENSIVE METABOLIC PANEL
Albumin: 4.1 g/dL (ref 3.5–5.2)
Alkaline Phosphatase: 92 U/L (ref 39–117)
BUN: 16 mg/dL (ref 6–23)
CO2: 31 mEq/L (ref 19–32)
GFR: 108.42 mL/min (ref >60.00)
Glucose, Bld: 107 mg/dL — ABNORMAL HIGH (ref 70–99)
Potassium: 4.8 mEq/L (ref 3.5–5.1)
Total Bilirubin: 0.9 mg/dL (ref 0.3–1.2)

## 2010-09-09 LAB — CBC WITH DIFFERENTIAL/PLATELET
Basophils Relative: 0.3 % (ref 0.0–3.0)
Eosinophils Relative: 0.1 % (ref 0.0–5.0)
MCV: 95.8 fl (ref 78.0–100.0)
Monocytes Absolute: 0.6 10*3/uL (ref 0.1–1.0)
Monocytes Relative: 7.2 % (ref 3.0–12.0)
Neutrophils Relative %: 75.6 % (ref 43.0–77.0)
Platelets: 254 10*3/uL (ref 150.0–400.0)
RBC: 4.45 Mil/uL (ref 3.87–5.11)
WBC: 9 10*3/uL (ref 4.5–10.5)

## 2010-09-13 ENCOUNTER — Encounter: Payer: Self-pay | Admitting: Internal Medicine

## 2010-09-14 NOTE — Discharge Summary (Signed)
NAMENARIYA, NEUMEYER NO.:  [id number]   MEDICAL RECORD NO.:  [id number]          PATIENT TYPE:  OBV   LOCATION:  3036                         FACILITY:  [facility]   PHYSICIAN:  Rosalyn Gess. Norins, MD  DATE OF BIRTH:  11/16/1928   DATE OF ADMISSION:  11/17/2008  DATE OF DISCHARGE:  11/18/2008                               DISCHARGE SUMMARY   ADMITTING DIAGNOSIS:  Cerebrovascular accident.   DISCHARGE DIAGNOSIS:  Cerebrovascular accident with symptoms resolved.   CONSULTANTS:  None.   PROCEDURES:  None.   HISTORY OF PRESENT ILLNESS:  The patient was recently admitted on November 09, 2008, through November 12, 2008, for CVA involving the occipital region  as evidenced by MRI, which showed clustered acute punctate infarctions  in the right occipital/posterior and parietal region.  MRA revealed a  probable cut off of the right posterior cerebral artery branch vessel.  The patient was doing well after her discharge until the day of  admission when around lunch time she had a 10- to 15-minute episode of  loss of vision similar to recent previous event.  She called EMS and was  transported to [facility] Emergency Department.  Her symptoms were completely  resolved by the time of admission.  She was brought in for 23-hour  observation to rule out any further events and to get her started on  Coumadin therapy.  Please see the EMR generated admit note.   HOSPITAL COURSE:  The patient was on the Neuro Unit.  She was observed  and shown to have no recurrent neurologic symptoms.  She was started on  her first dose of Coumadin.  She remained stable with no neurologic  events or changes.  At this point, she is now ready for discharge home.  She will continue on Coumadin overlapping with Plavix.  She will be  followed up in the [facility] on Friday, November 21, 2008, for  followup and adjustment of her medications.  She will be seen in the  office in 10-14 days for followup.  She will  call in the interval for  any problems.   DISCHARGE PHYSICAL EXAMINATION:  VITAL SIGNS:  Temperature was 98.4,  blood pressure 157/89, heart rate 69, respirations 16, O2 sats 94% on  room air.  GENERAL APPEARANCE:  A pleasant woman sitting on the side of the bed in  no acute distress.  HEENT:  Unremarkable.  NEURO:  The patient is awake, alert and oriented to person, place, time,  and context.  Cranial nerves II through XII were grossly intact with no  facial asymmetry.  Speech was clear.  Extraocular muscles were intact.  The patient moves all extremities to command.  No further examination  conducted.   DISCHARGE MEDICATIONS:  1. The patient will resume all of her home medications except for      discontinuing aspirin.  2. She will continue Plavix until she has a therapeutic INR.  3. She will continue nifedipine CR 30 mg daily.  4. Omeprazole 20 mg q.a.m.  5. Hydrocodone/acetaminophen 7./750 one-half tablet q.3 h. P.r.n.  6. Alprazolam 0.25 mg one-half tablet q.3 h. while awake.  7. Centrum Silver.  8. Lopressor 50 mg b.i.d.  9. Calcium with vitamin D daily.  10.Glucosamine 1500 mg daily.  11.Garlic powder 320 mg daily.  12.MiraLax daily.  13.Simvastatin 20 mg daily.  14.She will continue on Coumadin 5 mg daily and this will be adjusted      by the [facility].   DISPOSITION:  The patient will be discharged home in good condition.  She lives independently, does not need any assistance at this time.   The patient's condition at the time of discharge dictation is stable and  improved.      Rosalyn Gess Norins, MD  Electronically Signed     MEN/MEDQ  D:  11/18/2008  T:  11/18/2008  Job:  [id number]   cc:   Shelby Dubin, PharmD, BCPS, CPP

## 2010-09-14 NOTE — Discharge Summary (Signed)
NAMEJAVERIA, Jacqueline [name] NO.:  [id number]   MEDICAL RECORD NO.:  [id number]          PATIENT TYPE:  INP   LOCATION:  3007                         FACILITY:  [facility]   PHYSICIAN:  Rosalyn Gess. Norins, MD  DATE OF BIRTH:  1928-12-18   DATE OF ADMISSION:  11/09/2008  DATE OF DISCHARGE:  11/12/2008                               DISCHARGE SUMMARY   ADMITTING DIAGNOSES:  1. Transient bilateral loss of vision.  2. Hypertension.  3. Hyperlipidemia.  4. Gastroesophageal reflux disease.  5. Chronic low back pain.  6. Anxiety disorder.  7. Osteoarthritis.   DISCHARGE DIAGNOSES:  1. Cardiovascular accident involving the occipital parietal region.  2. Transient bilateral loss of vision.  3. Hypertension.  4. Hyperlipidemia.  5. Gastroesophageal reflux disease.  6. Chronic low back pain.  7. Anxiety disorder.  8. Osteoarthritis.   CONSULTANTS:  None.   PROCEDURES:  1. CT scan of the brain, October 10, 2008, with no acute intracranial      abnormality and cerebral atrophy.  2. Portable chest x-ray one view, which showed low lung volumes with      no acute findings.  Large hiatal hernia noted.  3. MRI of the brain with contrast, which showed a clustered acute      punctate infarctions in the right occipital posterior parietal      region, suggesting embolic disease.  4. MRA of the brain, which showed intracranial atherosclerotic      changes, most notable on the right A1 segment and middle cerebral      branches.  Probable cut off of a right posterior cerebral artery      branch vessel corresponding to the infarction territory.   HISTORY OF PRESENT ILLNESS:  Ms. Whitelaw is a 77 year old woman with  longstanding history of hypertension and hyperlipidemia.  She presented  to the emergency department after experiencing a 1 to 1-1/2-hour episode  of bilateral blurred vision.  She reports this was sudden onset.  She  was unable to focus on the television.  She was unable to read.   She  also subsequently fell weak and off balance.  She denied any loss of  consciousness, but had persistent blurred vision.  Because of these  symptoms, she presented to the emergency department.  At presentation,  she did not have any confusion, but had generalized weakness in the  bilateral lower extremities and was concern for balance and falling.  She denied any headache.  She had no focal neurologic signs on exam at  admission.  She did have a mild headache.  Cognition was intact.  The  patient was mildly anxious due to these events.  Because of her  symptoms, she was admitted to hospital.   Please see the dictated H and P for past medical history, family  history, and examination at admission.   HOSPITAL COURSE:  1. Neuro:  The patient's MRI did confirm that she had punctate      infarctions in the occipital parietal areas on the right      corresponding with her symptoms of transient  problems with vision.      This also would explain given the cerebellar artery cutoff,      possible weakness and balance issues.  The patient was started on      aspirin 81 mg and Plavix for stroke prevention.  The patient had no      residual symptoms and was doing very well.  She was seen in      consultation by physical therapy who felt the patient was doing      well and had reasonably good mobility.  Recommendation was for the      patient to have home health PT at the time of discharge.  2. Hypertension.  The patient's blood pressure remained adequately      controlled during the hospital stay and she will be continued on      her home medications.   Problems 3 through 7 were stable.   DISCHARGE EXAMINATION:  VITAL SIGNS:  Temperature is 98.8, blood  pressure 140/75, respirations were 18, heart rate was 79, and O2 sats  98% on room air.  GENERAL APPEARANCE:  This is a well-nourished vivacious woman looking  younger than stated age who is sitting on the side of the bed and ready  to go  home.  HEENT:  Normocephalic and atraumatic.  CHEST:  The patient is moving air well with no rales, wheezes, or  rhonchi.  CARDIOVASCULAR:  Radial pulse 2+.  Her precordium was quiet.  She had a  regular rate and rhythm.  Telemetry revealed normal sinus rhythm.  ABDOMEN:  Soft and no guarding or rebound.  NEUROLOGIC:  The patient is awake and alert.  She is oriented to person,  place, time, and context.  Cranial nerves II through XII are intact with  normal facial asymmetry and movement.  Extraocular muscles were intact.  Pupils are equal, round, reactive to light and accommodation.  She had  no visual field cuts.  Motor strength was 5/5.  Cerebellar function, the  patient had no tremor.  She was not stood or ambulated with physical  therapy report as noted.  EXTREMITIES:  Without clubbing, cyanosis, or edema.   FINAL LABORATORY:  TSH was 2.553.  Lipid profile November 10, 2008, with  cholesterol of 137, triglycerides of 50, HDL was 63, and LDL was 64.  Basic metabolic panel from November 10, 2008, with sodium 142, potassium  4.6, chloride 102, CO2 of 32, BUN of 15, creatinine 0.75, and glucose of  112.  ESR was 17 and normal.  Cardiac enzymes were checked x2 and were  negative.  LDL direct was 159.   DISPOSITION:  The patient is ready to be discharged to home.  We will  arrange for home health physical therapy as noted.   DISCHARGE MEDICATIONS:  The patient will resume her home medications  including:  1. Alprazolam 0.25 mg one-half tablet q.3 h. p.r.n.  2. Calcium with D 2 tablets daily.  3. Centrum Silver daily.  4. hydrocodone and acetaminophen 7.5/750 one-half tablet every 3 hours      as needed.  5. Lopressor 50 mg b.i.d.  6. Nifedipine ER 30 mg daily.  7. Omeprazole 40 mg daily.  8. Garlic daily.  9. Glucosamine daily.  10.MiraLax 17 g daily as needed.  11.Simvastatin 20 mg daily.   New medications include aspirin 81 mg daily and Plavix 75 mg daily.   FOLLOWUP:  The patient  will be seen in the office in 7-10 days.  The patient's condition at time of discharge dictation is stable and  improved.      Rosalyn Gess Norins, MD  Electronically Signed     MEN/MEDQ  D:  11/12/2008  T:  11/12/2008  Job:  [id number]

## 2010-09-14 NOTE — H&P (Signed)
Jacqueline [name], Jacqueline [name] NO.:  [id number]   MEDICAL RECORD NO.:  [id number]          PATIENT TYPE:  EMS   LOCATION:  MAJO                         FACILITY:  [facility]   PHYSICIAN:  Hollice Espy, M.D.DATE OF BIRTH:  10-15-1928   DATE OF ADMISSION:  11/09/2008  DATE OF DISCHARGE:                              HISTORY & PHYSICAL   PRIMARY CARE PHYSICIAN:  Rosalyn Gess. Norins, MD.   CHIEF COMPLAINT:  Transient bilateral vision loss.   HISTORY OF PRESENT ILLNESS:  Ms. Grimmer is a 77 year old white female  with a longstanding history of hypertension and hyperlipidemia. She  presents with 1-1/2 hour duration of bilateral blurred vision. She  finished having her supper, and then she was walking toward the TV and  related that she could not see clearly what was going on the screen, and  then she tried dialing her neighbor's phone number, and it was  impossible for her to clearly see what the numbers were.  She denies  having a blackout, but she had blurred vision. Therefore, she was  concerned and took her time and called her son and was brought to the  emergency room.  She did not have any confusion associated with this but  had generalized weakness in bilateral lower extremities and was  concerned that she might fall because of the weakness.  Therefore, did  not walk and just sat down.  She denies having any headache. There are  no other focal neurological signs.  She has a mild headache now but not  at that time.  Her cognition was intact. She reports to have been  anxious. This never happened to her before.  She had no urinary  incontinence.  There was no seizure-like activity.  The patient was  brought to the emergency room, and a CT scan of her head was obtained  which was negative for any acute intracranial abnormalities.  There was  diffuse cerebral atrophy seen.   REVIEW OF SYSTEMS:  A complete review of systems was done which included  general head, eyes, ears,  nose, and throat, cardiovascular, respiratory,  GI, GU, endocrine, musculoskeletal, skin, neurological, psychiatric, all  of which were normal other than as mentioned above.   PAST MEDICAL HISTORY:  1. Hypertension.  2. Hyperlipidemia.  3. Gastroesophageal reflux disease.  4. Hiatal hernia.  5. Peptic ulcer disease.  6. Gastrointestinal bleed secondary to peptic ulcer disease.  7. Anemia secondary to gastrointestinal bleed requiring blood      transfusion.  8. Osteoarthritis.  9. Irritable bowel syndrome.  10.Generalized anxiety disorder.  11.Diverticulosis.  12.Hemorrhoids.  13.Degenerative disk disease.   ALLERGIES:  PENICILLIN, which gives her a rash.   CURRENT MEDICATIONS AT HOME:  1. Nifediac PC 30 mg 1 tablet p.o. daily.  2. Omeprazole 20 mg b.i.d.  3. Hydrocodone/acetaminophen 7.5/750 one-half tablet p.o. every 3      hours as needed for pain.  4. Alprazolam 0.25 mg 1/2 tablet up to 8 as needed per day.  5. Centrum Silver 1 tablet once daily.  6. Metoprolol 50 mg 1 tablet  p.o. b.i.d.  7. Calcium vitamin D 600/200 two tablets by mouth p.o. once daily.  8. Glucosamine 1500 two capsules p.o. once per day.  9. Garlic __________ mg 1 tablet once per day.  10.MiraLax daily p.r.n.  11.Simvastatin 20 mg p.o. q.h.s.   SOCIAL HISTORY:  There is no history of tobacco, alcohol, or illicit  drugs.   FAMILY HISTORY:  Noncontributory.   PHYSICAL EXAMINATION:  VITAL SIGNS: T max 98.2, pulse rate 96, blood  pressure 127/84, respirations 16.  O2 sat 99% on room air.  GENERAL:  Not in any acute distress.  Alert and oriented x3.  Afebrile.  HEENT: Normocephalic, atraumatic.  Pupils are equal, round, and reactive  to light and accommodation.  Extraocular muscles are intact.  Mucous  membranes are moist.  NECK: No JVD, lymphadenopathy, or carotid bruits.  CARDIOVASCULAR: Regular rhythm.  Rate is normal.  No murmurs, rubs, or  gallops.  LUNGS: Clear to auscultation bilaterally.   EXTREMITIES:  No clubbing, cyanosis, or edema.  ABDOMEN: Benign.  NEUROLOGICAL:  Strength 5/5 throughout.  DTRs 2+ bilaterally.  Sensation  is intact. Finger-to-nose test within normal limits.   LABS AND STUDIES:  CBC with differential WBCs 6700 , hemoglobin 14.6,  hematocrit 42.7, platelets 210, neutrophils 74%, lymphocytes 19%,  monocytes 7%, eosinophils 1%.  Sodium 138, potassium 4.6, chloride 103,  bicarb 28, BUN 19, creatinine 0.8, blood glucose 126.  Total protein  6.4, albumin 3.6, ALT 37, AST 45, alkaline phosphatase 70, total  bilirubin 0.8.  Urinalysis within normal limits.  Chest x-ray revealed  low lung volumes, no acute findings.  Large hiatal hernia.  CT scan of  the head without contrast revealed no acute intracranial abnormality.  Cerebral atrophy.  CT chest with contrast dated May 17, 2008 no  airway compromise or lesion evident in the chest.  Large hiatal hernia.  Mild compressed atelectasis of the medial aspect of the left lower lobe.  Atherosclerosis.   ASSESSMENT AND PLAN:  1. Transient bilateral loss of vision.  There was no complete vision      loss. It was blurred vision.  Multiple possibilities exist,      including a transient ischemic attack versus complex migraines      versus embolic phenomenon versus temporal arteritis, etc.  If this      was due to cerebral ischemia, most likely is posterior circulation      rather than the carotids, will evaluate further with an MRI of the      brain.  Will also get an MRA of the head. Will also do carotid      Dopplers to evaluate for any critical stenosis. Will get a 2-D      echocardiogram with bubble study to evaluate for any embolic source      in the heart.  Will check an ESR.  The patient is complaining of      occasional pain with chewing.  This is concerning  given her      transient loss of vision. Will evaluate for temporal arteritis if      there is an elevation in ESR.  At that time will start the  patient      on steroids.  Will consider temporal artery biopsy.  It does not      look like migraines.  She has no headaches and no history of      migraines in the past.  2. Hypertension. If this is a transient ischemic attack, we  would not      want to __________ with the blood pressure control.  Will hold off      on all antihypertensive medications at this point.  3. Hyperlipidemia.  Will check a fasting lipid panel in the morning.      Will continue statin.  4. Gastroesophageal reflux disease.  Will continue proton pump      inhibitor.  5. Chronic lower back pain secondary to degenerative disk disease.      Will continue hydrocodone/acetaminophen.  6. Anxiety disorder.  Will continue benzodiazepines.  7. Osteoarthritis.  Will continue glucosamine.  8. Diverticulosis.  Will encourage a high-fiber diet.  9. Deep vein thrombosis prophylaxis with SCDs.  10.Fluids, electrolytes, and nutrition.  We will replace __________ as      needed.  Will start the patient on an AHA diet.   DISPOSITION:  Will admit the patient to the medical floor with  telemetry.      Hollice Espy, M.D.  Electronically Signed     SKK/MEDQ  D:  11/10/2008  T:  11/10/2008  Job:  [id number]

## 2010-09-14 NOTE — Assessment & Plan Note (Signed)
[facility]                                 ON-CALL NOTE   Jacqueline [name], Jacqueline [name]                           MRN:          [id number]  DATE:10/22/2006                            DOB:          February 19, 1929    PRIMARY CARE PHYSICIAN:  Dr. Debby Bud.   TIME OF CALL:  6:54am   The patient calls complaining of her blood pressure being high and  machine reading error. The patient states that she was taken off of two  medicines a week ago because she was hallucinating and other side  effects. She has a history of high blood pressure and bleeding ulcer for  which she has hospitalized for on several occasions a few years. She is  complaining of tingling in her fingers and just feeling awful. She is on  Adalat, Maxzide, omeprazole, and there are two other medicines that she  cannot pronounce that she was on for her ulcer. She is also taking  Metoprolol. She stated that her blood pressure was 164/90, and her pulse  was 105, and she states I feel like I am leaving this world. The  patient is worried about blood sugars and many other things. I  recommended that she go to urgent care or to the emergency room to be  evaluated today.     Lelon Perla, DO  Electronically Signed    Shawnie Dapper  DD: 10/22/2006  DT: 10/22/2006  Job #: [id number]   cc:   Rosalyn Gess. Norins, MD

## 2010-09-17 NOTE — Discharge Summary (Signed)
NAMECOURTENAY, HIRTH NO.:  [id number]   MEDICAL RECORD NO.:  [id number]          PATIENT TYPE:  INP   LOCATION:  5006                         FACILITY:  [facility]   PHYSICIAN:  Rene Paci, M.D. LHCDATE OF BIRTH:  01-25-1929   DATE OF ADMISSION:  12/19/2004  DATE OF DISCHARGE:  12/22/2004                                 DISCHARGE SUMMARY   DISCHARGE DIAGNOSES:  1.  Cameron erosions, secondary to hiatal hernia.  2.  Iron deficiency anemia with shortness of breath, secondary to slow      chronic gastrointestinal bleed.   HISTORY OF PRESENT ILLNESS:  The patient is a 77 year old white female with  a history of a gastric ulcer in 2001, who presented to [facility], secondary  to shortness of breath and was found to have a hemoglobin of 8.4 which was  previously 14 in January 2006.  The patient noted bright red blood in her  stools over the last few weeks, but denied melena.   PAST MEDICAL HISTORY:  1.  Gastric ulcer in November 2001, with severe anemia.  2.  Hypertension.  3.  Osteoarthritis.  4.  Irritable bowel syndrome.  5.  Anxiety.   HOSPITAL COURSE:  #1 - Iron deficiency anemia with shortness of breath,  secondary to slow chronic GI bleed:  The patient was admitted and was given  2 units of packed red blood cells, secondary to a hemoglobin of 7.8 on  admission.  A GI consultation was obtained and the patient underwent an EGD  on December 21, 2004.  The EGD noted a large hiatal hernia with erosive  gastritis and a diaphoretic hiatal hernia, secondary to prolapse, consistent  with Cameron erosions per GI.  This erosion was thought to be secondary to  mechanical trauma from the sliding of the hiatal hernia.  Plan to maintain  the patient on daily proton pump inhibitor as well as daily iron for iron  deficiency anemia.  The patient is to follow up with GI as needed.  At the  time of discharge the patient has no further complaints of shortness of  breath.   DISCHARGE MEDICATIONS:  1.  Prilosec 20 mg p.o. daily in place of Ranitidine which is also over-the-      counter.  2.  Iron 325 mg once daily.  3.  Multivitamin once daily.  4.  Calcium 600 mg with D, one tab p.o. b.i.d.  5.  Metoclopramide 5 mg, one tab p.o. before meals and at bedtime.  6.  Milk of magnesia, two teaspoonfuls each night.  7.  Vicodin 5/500 mg, 1/2 tab p.o. q.3-4h. as needed.  8.  Metoprolol 50 mg p.o. b.i.d.  9.  __________ 30 mg p.o. daily.  10. Xanax 0.25 mg q.i.d.  11. Bentyl 20 mg p.o. q.i.d.  12. Maxzide 37.5/25 mg, one tab daily.   LABORATORY DATA:  Post-transfusion hemoglobin was 10.7, hematocrit 33.4.   FOLLOWUP:  A follow-up appointment has been scheduled for the patient to see  her primary care physician, Dr. Rosalyn Gess. Norins, on January 11, 2005, at  3:45 p.m.      Melissa S. Peggyann Juba, NP      Rene Paci, M.D. [facility]  Electronically Signed    MSO/MEDQ  D:  12/22/2004  T:  12/22/2004  Job:  [id number]   cc:   Rosalyn Gess. Norins, M.D. [facility]  [address]  [place]  [place] [address]

## 2010-09-17 NOTE — Discharge Summary (Signed)
[place]  Patient:    Jacqueline [name], Jacqueline [name]                         MRN: [id number] Adm. Date:  [id number] Disc. Date: 04-18-00 Attending:  Duke Salvia Dictator:   Cornell Barman, P.A. CC:         Rosalyn Gess. Norins, M.D. [facility]  Molly Maduro D. Arlyce Dice, M.D. [facility]   Discharge Summary  DISCHARGE DIAGNOSES: 1. Microcytic anemia. 2. Gastric ulcer. 3. Diverticulosis.  BRIEF ADMISSION HISTORY:  Ms. Emme is a 77 year old white female who presented with complaints of profound weakness and shortness of breath with exertion.  The patient had complaint of dyspepsia in the past. She also give a history of peptic ulcer disease in the past. She states over the past several weeks she had been taking diclofenac for low back pain.  Now she reports dyspnea on exertion.  She also complains of weakness as well as lightheadedness with standing. She denies any abdominal pain. She does admit to having bright red hematochezia which she attributed to hemorrhoids.  She denies any melena or hematemesis.  Labs in the office on March 27, 2000, revealed a hemoglobin of 5.8.  The patient was admitted for further GI evaluation.  PAST MEDICAL HISTORY: 1. Hypertension. 2. Low back pain, status post CT myelogram that revealed degenerative joint    disease, degenerative disc disease, question of mild root impingement at L5    on the left with mild to moderate spinal stenosis at L4-5.  She as status    post right ankle fracture. 3. Irritable bowel syndrome.  HOSPITAL COURSE:  #1 - GI:  The patient presented with significant anemia.  Iron studies were ordered but were not done. The patient was transfused three units of packed red blood cells.  Her hemoglobin went from 5.8 to 8.7.  Her MCV was 73.6. Occult blood was negative x 3.  The patient underwent endoscopy by Barbette Hair. Arlyce Dice, M.D., on March 29, 2000.  This revealed a gastric ulcer, acute without hemorrhage.  The  patient was started on Protonix. The patient underwent bowel prep for colonoscopy. This was performed on March 30, 2000, and revealed internal and external hemorrhoids with diverticulosis without any acute bleeding.  The patient was hemodynamically stable following her transfusion. The patient does have a microcytic anemia but unfortunately iron studies were not done prior to her transfusion. The patient needs to have iron studies done in the next two weeks.  MEDICATIONS AT DISCHARGE: 1. Protonix 40 mg q.d. 2. Bentyl 20 mg q.i.d. for irritable bowel syndrome 3. Lopressor 50 mg b.i.d. 4. Maxzide 37.5/25 mg q.d. 5. Xanax 0.25 mg q.8h. p.r.n. 6. Adalat 30 mg q.d. 7. The patient is instructed not to take ranitidine or diclofenac. 8. She is instructed to use Tylenol for pain.  FOLLOW-UP:  She is to follow up with Casimiro Needle E. Norins, M.D., in two weeks and have a CBC and iron studies drawn at that time. DD:  04/18/2000 TD:  04-18-00 Job: [id number]

## 2010-09-20 ENCOUNTER — Telehealth: Payer: Self-pay

## 2010-09-20 NOTE — Telephone Encounter (Signed)
Patient called [facility] requesting a call back. She states that she received a bill ($68.60) from the lab work that she had done. Patient has [facility] [phone] and says that they usual take care of everything.

## 2010-09-22 MED ORDER — CLOPIDOGREL BISULFATE 75 MG PO TABS: 75.0000 mg | ORAL_TABLET | Freq: Every day | ORAL | Status: DC

## 2010-09-22 NOTE — Telephone Encounter (Signed)
Patient called [facility] requesting a personal call back from Sarah.

## 2010-09-22 NOTE — Telephone Encounter (Signed)
Spoke w/ins - they have no claims for 5/10 date yet and will not process this date until June. Pt has letter from [facility] stating she owes 68.60. She will bring me letter to see if I can help further.

## 2010-09-23 ENCOUNTER — Other Ambulatory Visit: Payer: Self-pay | Admitting: *Deleted

## 2010-09-29 NOTE — Telephone Encounter (Signed)
Patient informed. 

## 2010-09-29 NOTE — Telephone Encounter (Signed)
No ins was on file when pt was in the office, ins updated and billing will reprocess, need to call pt to explain

## 2010-10-14 ENCOUNTER — Telehealth: Payer: Self-pay | Admitting: *Deleted

## 2010-10-14 MED ORDER — HYDROCODONE-ACETAMINOPHEN 7.5-750 MG PO TABS
0.5000 | ORAL_TABLET | ORAL | Status: DC | PRN
Start: 2010-10-14 — End: 2010-12-27

## 2010-10-14 MED ORDER — ALPRAZOLAM 0.25 MG PO TABS: 0.1250 mg | ORAL_TABLET | ORAL | Status: DC

## 2010-10-14 NOTE — Telephone Encounter (Signed)
Ok for refill  x3 

## 2010-10-14 NOTE — Telephone Encounter (Signed)
Pt. Requesting Rx refills in to [facility] for:  Hydrocodone-Acetaminophen 7.5-750mg  [last refill 03/15/10 #360x1]  Alprazolam 0.25mg  [last refill 04/13/10 #360x1]

## 2010-10-14 NOTE — Telephone Encounter (Signed)
Pending signature

## 2010-10-15 NOTE — Telephone Encounter (Signed)
Faxed script back to [facility] @ [phone], also notified pt rx was fax back.Marland KitchenMarland Kitchen6/15/12@10 :29am/LMB

## 2010-11-12 ENCOUNTER — Encounter: Payer: Self-pay | Admitting: Internal Medicine

## 2010-11-14 ENCOUNTER — Encounter: Payer: Self-pay | Admitting: Internal Medicine

## 2010-11-18 ENCOUNTER — Ambulatory Visit: Payer: Medicare Other | Admitting: Internal Medicine

## 2010-11-23 ENCOUNTER — Telehealth: Payer: Self-pay | Admitting: *Deleted

## 2010-11-23 NOTE — Telephone Encounter (Signed)
Pt has multiple OTC products for constipation. She was on gen prescription miralax but says this caused bleeding. She is on plavix and has colace, peri-colace and sennakot. What is ok to take for constipation?

## 2010-11-23 NOTE — Telephone Encounter (Signed)
Go with the sennakot 2 tablets qhs for constipation

## 2010-11-24 NOTE — Telephone Encounter (Signed)
Patient informed. 

## 2010-12-27 ENCOUNTER — Telehealth: Payer: Self-pay | Admitting: *Deleted

## 2010-12-27 MED ORDER — TRAMADOL HCL 50 MG PO TABS: 50.0000 mg | ORAL_TABLET | Freq: Four times a day (QID) | ORAL | Status: DC | PRN

## 2010-12-27 MED ORDER — HYDROCODONE-ACETAMINOPHEN 7.5-750 MG PO TABS: 0.5000 | ORAL_TABLET | ORAL | Status: DC | PRN

## 2010-12-27 MED ORDER — ALPRAZOLAM 0.25 MG PO TABS: 0.1250 mg | ORAL_TABLET | ORAL | Status: DC

## 2010-12-27 NOTE — Telephone Encounter (Signed)
Pending signature

## 2010-12-27 NOTE — Telephone Encounter (Signed)
Patient requesting RF of 3 Rx's to go to [facility]. OK for hydrocodone, alprazolam & tramadol?

## 2010-12-27 NOTE — Telephone Encounter (Signed)
OK on all refills to [facility]

## 2010-12-28 NOTE — Telephone Encounter (Signed)
Faxed

## 2011-01-24 ENCOUNTER — Ambulatory Visit (INDEPENDENT_AMBULATORY_CARE_PROVIDER_SITE_OTHER): Payer: Medicare Other | Admitting: *Deleted

## 2011-01-24 DIAGNOSIS — Z23 Encounter for immunization: Secondary | ICD-10-CM

## 2011-03-08 ENCOUNTER — Telehealth: Payer: Self-pay | Admitting: *Deleted

## 2011-03-08 MED ORDER — PANTOPRAZOLE SODIUM 40 MG PO TBEC: 40.0000 mg | DELAYED_RELEASE_TABLET | ORAL | Status: DC

## 2011-03-08 NOTE — Telephone Encounter (Signed)
Patient informed. Rx Done to [facility].

## 2011-03-08 NOTE — Telephone Encounter (Signed)
Patient states that she is taking [2] Omeprazole every morning and it is no longer working - states hiatal hernia causing her issues - Taking GasX Extra Strength BID as well. Patient request new Alternative Generic medication to replace Omeprazole.

## 2011-03-08 NOTE — Telephone Encounter (Signed)
protonix generic 40 mg every AM, #30, refill prn

## 2011-05-02 ENCOUNTER — Telehealth: Payer: Self-pay | Admitting: *Deleted

## 2011-05-02 NOTE — Telephone Encounter (Signed)
Pt needing refill on hydrocodone sent to [facility] mail order

## 2011-05-04 MED ORDER — HYDROCODONE-ACETAMINOPHEN 7.5-750 MG PO TABS: 0.5000 | ORAL_TABLET | ORAL | Status: DC | PRN

## 2011-05-04 NOTE — Telephone Encounter (Signed)
Done via fax

## 2011-05-04 NOTE — Telephone Encounter (Signed)
Ok for mail order refill on her hydrocodone

## 2011-05-05 ENCOUNTER — Other Ambulatory Visit: Payer: Self-pay | Admitting: *Deleted

## 2011-05-05 NOTE — Telephone Encounter (Signed)
Pt would like a temporary supply of Hydrocodone pain medication sent to [facility] on [address] until she receives her mail order rx from [facility]. Pt states that she will be completely out of medication on Saturday and that [facility] has just mailed her shipment out today and it will take 10-14 business days until she receives medication.

## 2011-05-06 ENCOUNTER — Other Ambulatory Visit: Payer: Self-pay | Admitting: *Deleted

## 2011-05-06 MED ORDER — NIFEDIPINE ER 30 MG PO TB24: 30.0000 mg | ORAL_TABLET | Freq: Every day | ORAL | Status: DC

## 2011-05-06 MED ORDER — HYDROCODONE-ACETAMINOPHEN 7.5-750 MG PO TABS: 0.5000 | ORAL_TABLET | ORAL | Status: DC | PRN

## 2011-05-06 MED ORDER — METOPROLOL TARTRATE 50 MG PO TABS: 50.0000 mg | ORAL_TABLET | Freq: Every day | ORAL | Status: DC

## 2011-05-06 NOTE — Telephone Encounter (Signed)
Ok for 14 day supply of her vicodin

## 2011-05-06 NOTE — Telephone Encounter (Signed)
Rx called into [facility], pt informed.

## 2011-05-12 DIAGNOSIS — M1710 Unilateral primary osteoarthritis, unspecified knee: Secondary | ICD-10-CM | POA: Diagnosis not present

## 2011-05-12 DIAGNOSIS — M25519 Pain in unspecified shoulder: Secondary | ICD-10-CM | POA: Diagnosis not present

## 2011-06-14 ENCOUNTER — Telehealth: Payer: Self-pay

## 2011-06-14 DIAGNOSIS — I10 Essential (primary) hypertension: Secondary | ICD-10-CM

## 2011-06-14 DIAGNOSIS — Z8719 Personal history of other diseases of the digestive system: Secondary | ICD-10-CM

## 2011-06-14 DIAGNOSIS — D649 Anemia, unspecified: Secondary | ICD-10-CM

## 2011-06-14 NOTE — Telephone Encounter (Signed)
Pt called stating she experienced a single episode of blurred vision last week that lasted a few minutes. Pt is requesting lab orders for corresponding studies that can r/o any kidney/liver problems. Please advise on orders appropriate orders.

## 2011-06-14 NOTE — Telephone Encounter (Signed)
Orders entered for lab 

## 2011-06-16 ENCOUNTER — Other Ambulatory Visit (INDEPENDENT_AMBULATORY_CARE_PROVIDER_SITE_OTHER): Payer: MEDICARE

## 2011-06-16 DIAGNOSIS — D649 Anemia, unspecified: Secondary | ICD-10-CM | POA: Diagnosis not present

## 2011-06-16 DIAGNOSIS — I10 Essential (primary) hypertension: Secondary | ICD-10-CM | POA: Diagnosis not present

## 2011-06-16 DIAGNOSIS — Z8719 Personal history of other diseases of the digestive system: Secondary | ICD-10-CM

## 2011-06-16 LAB — HEPATIC FUNCTION PANEL
Albumin: 4.3 g/dL (ref 3.5–5.2)
Alkaline Phosphatase: 82 U/L (ref 39–117)
Bilirubin, Direct: 0.1 mg/dL (ref 0.0–0.3)

## 2011-06-16 LAB — CBC WITH DIFFERENTIAL/PLATELET
Basophils Relative: 0.4 % (ref 0.0–3.0)
Eosinophils Relative: 1 % (ref 0.0–5.0)
Hemoglobin: 14.4 g/dL (ref 12.0–15.0)
Lymphocytes Relative: 29.1 % (ref 12.0–46.0)
Monocytes Relative: 8.7 % (ref 3.0–12.0)
Neutro Abs: 3.3 10*3/uL (ref 1.4–7.7)
RBC: 4.29 Mil/uL (ref 3.87–5.11)
WBC: 5.4 10*3/uL (ref 4.5–10.5)

## 2011-06-16 LAB — COMPREHENSIVE METABOLIC PANEL
ALT: 16 U/L (ref 0–35)
Albumin: 4.3 g/dL (ref 3.5–5.2)
BUN: 14 mg/dL (ref 6–23)
CO2: 31 mEq/L (ref 19–32)
Calcium: 9.8 mg/dL (ref 8.4–10.5)
Chloride: 100 mEq/L (ref 96–112)
Creatinine, Ser: 0.7 mg/dL (ref 0.4–1.2)
GFR: 80.07 mL/min (ref >60.00)
Potassium: 4.7 mEq/L (ref 3.5–5.1)

## 2011-06-19 ENCOUNTER — Encounter: Payer: Self-pay | Admitting: Internal Medicine

## 2011-07-04 ENCOUNTER — Telehealth: Payer: Self-pay | Admitting: *Deleted

## 2011-07-04 NOTE — Telephone Encounter (Signed)
Ok for 90 day supply to [facility]

## 2011-07-04 NOTE — Telephone Encounter (Signed)
Pt is req Rf on Alprazolam to go to [facility].

## 2011-07-05 MED ORDER — ALPRAZOLAM 0.25 MG PO TABS: 0.1250 mg | ORAL_TABLET | ORAL | Status: DC

## 2011-07-05 NOTE — Telephone Encounter (Signed)
Faxed to [facility] Rx.

## 2011-07-05 NOTE — Telephone Encounter (Signed)
Rx printed awaiting MD signature for faxing.

## 2011-07-20 ENCOUNTER — Other Ambulatory Visit: Payer: Self-pay

## 2011-07-20 MED ORDER — IPRATROPIUM BROMIDE 0.06 % NA SOLN: 2.0000 | Freq: Three times a day (TID) | NASAL | Status: DC

## 2011-07-21 DIAGNOSIS — M25519 Pain in unspecified shoulder: Secondary | ICD-10-CM | POA: Diagnosis not present

## 2011-07-21 DIAGNOSIS — M1710 Unilateral primary osteoarthritis, unspecified knee: Secondary | ICD-10-CM | POA: Diagnosis not present

## 2011-07-26 ENCOUNTER — Telehealth: Payer: Self-pay

## 2011-07-26 ENCOUNTER — Other Ambulatory Visit: Payer: Self-pay

## 2011-07-26 MED ORDER — IPRATROPIUM BROMIDE 0.06 % NA SOLN: 2.0000 | Freq: Three times a day (TID) | NASAL | Status: DC

## 2011-07-26 NOTE — Telephone Encounter (Signed)
Ok x 3  

## 2011-07-26 NOTE — Telephone Encounter (Signed)
Pt called requesting refill of Hydrocodone.  

## 2011-07-26 NOTE — Telephone Encounter (Signed)
Pt called stating Atrovent nasal spray was not received by pharmacy, Rx resent.

## 2011-07-27 MED ORDER — HYDROCODONE-ACETAMINOPHEN 7.5-750 MG PO TABS: 0.5000 | ORAL_TABLET | ORAL | Status: DC | PRN

## 2011-07-27 NOTE — Telephone Encounter (Signed)
Rx phoned in to [facility]

## 2011-09-16 ENCOUNTER — Telehealth: Payer: Self-pay

## 2011-09-16 MED ORDER — CLOPIDOGREL BISULFATE 75 MG PO TABS: 75.0000 mg | ORAL_TABLET | Freq: Every day | ORAL | Status: DC

## 2011-09-16 NOTE — Telephone Encounter (Signed)
Pt called requesting refill of Plavix.

## 2011-09-20 DIAGNOSIS — H25099 Other age-related incipient cataract, unspecified eye: Secondary | ICD-10-CM | POA: Diagnosis not present

## 2011-09-29 DIAGNOSIS — M1710 Unilateral primary osteoarthritis, unspecified knee: Secondary | ICD-10-CM | POA: Diagnosis not present

## 2011-09-29 DIAGNOSIS — IMO0002 Reserved for concepts with insufficient information to code with codable children: Secondary | ICD-10-CM | POA: Diagnosis not present

## 2011-09-29 DIAGNOSIS — M25519 Pain in unspecified shoulder: Secondary | ICD-10-CM | POA: Diagnosis not present

## 2011-10-03 ENCOUNTER — Telehealth: Payer: Self-pay

## 2011-10-03 MED ORDER — ALPRAZOLAM 0.25 MG PO TABS: 0.1250 mg | ORAL_TABLET | ORAL | Status: DC

## 2011-10-03 NOTE — Telephone Encounter (Signed)
Ok for refill. Last OV Jan '12. OK for 1 refill - needs OV (90 day supply)

## 2011-10-03 NOTE — Telephone Encounter (Signed)
Notified pt md ok refill will fax to [facility], also md advise f/u appt. Pt states she will call back to schedule...10/03/11@2 :16pm/LMB

## 2011-10-03 NOTE — Telephone Encounter (Signed)
Pt called requesting refills of Alprazolam to [facility] pharmacy.

## 2011-11-22 ENCOUNTER — Encounter: Payer: Self-pay | Admitting: Internal Medicine

## 2011-11-22 ENCOUNTER — Ambulatory Visit (INDEPENDENT_AMBULATORY_CARE_PROVIDER_SITE_OTHER): Payer: Medicare Other | Admitting: Internal Medicine

## 2011-11-22 ENCOUNTER — Other Ambulatory Visit (INDEPENDENT_AMBULATORY_CARE_PROVIDER_SITE_OTHER): Payer: Medicare Other

## 2011-11-22 VITALS — BP 118/70 | HR 88 | Temp 98.2°F | Resp 16 | Wt 159.0 lb

## 2011-11-22 DIAGNOSIS — D649 Anemia, unspecified: Secondary | ICD-10-CM

## 2011-11-22 DIAGNOSIS — M1710 Unilateral primary osteoarthritis, unspecified knee: Secondary | ICD-10-CM

## 2011-11-22 DIAGNOSIS — I10 Essential (primary) hypertension: Secondary | ICD-10-CM

## 2011-11-22 DIAGNOSIS — J984 Other disorders of lung: Secondary | ICD-10-CM | POA: Diagnosis not present

## 2011-11-22 DIAGNOSIS — F411 Generalized anxiety disorder: Secondary | ICD-10-CM

## 2011-11-22 DIAGNOSIS — R209 Unspecified disturbances of skin sensation: Secondary | ICD-10-CM

## 2011-11-22 DIAGNOSIS — Z8711 Personal history of peptic ulcer disease: Secondary | ICD-10-CM

## 2011-11-22 DIAGNOSIS — M1990 Unspecified osteoarthritis, unspecified site: Secondary | ICD-10-CM

## 2011-11-22 DIAGNOSIS — R351 Nocturia: Secondary | ICD-10-CM

## 2011-11-22 LAB — CBC WITH DIFFERENTIAL/PLATELET
Basophils Relative: 0.6 % (ref 0.0–3.0)
Eosinophils Absolute: 0.1 10*3/uL (ref 0.0–0.7)
Hemoglobin: 13.7 g/dL (ref 12.0–15.0)
Lymphocytes Relative: 30 % (ref 12.0–46.0)
MCHC: 33.3 g/dL (ref 30.0–36.0)
MCV: 99.9 fl (ref 78.0–100.0)
Neutro Abs: 3.1 10*3/uL (ref 1.4–7.7)
RBC: 4.12 Mil/uL (ref 3.87–5.11)

## 2011-11-22 LAB — COMPREHENSIVE METABOLIC PANEL
AST: 25 U/L (ref 0–37)
BUN: 12 mg/dL (ref 6–23)
Calcium: 9.7 mg/dL (ref 8.4–10.5)
Chloride: 99 mEq/L (ref 96–112)
Creatinine, Ser: 0.6 mg/dL (ref 0.4–1.2)
GFR: 96.3 mL/min (ref >60.00)

## 2011-11-22 LAB — VITAMIN B12: Vitamin B-12: 537 pg/mL (ref 211–911)

## 2011-11-22 MED ORDER — MIRABEGRON ER 25 MG PO TB24: 25.0000 mg | ORAL_TABLET | Freq: Every day | ORAL | Status: DC

## 2011-11-22 NOTE — Assessment & Plan Note (Addendum)
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 She declines to consider TKR. Has q 10 week steroid injections. (Dr. Ranell Patrick). Is mobile with use of rolling walker.  For her pain she uses an odd regimen of vicodin which has worked for years. Executed "Controlled Substance Agreement" today.

## 2011-11-22 NOTE — Progress Notes (Signed)
Subjective:    Patient ID: Jacqueline [name], female    DOB: 27-Sep-1928, 78 y.o.   MRN: [id number]  HPI Jacqueline [name] presents for medication refill. She has been seeing Dr. Ranell Patrick- having knee injections every 10 weeks, also shoulder injections. She is adamant about not having surgery. She does have a valgus deformity. Reviewed all her medications: she is taking the same meds and is stable. She is taking protonix and she tolerates this as long as she takes simethicon. She does have an occasion hemorrhoidal bleed. She otherwise has no complaints.  Past Medical History  Diagnosis Date  . Unspecified cerebral artery occlusion with cerebral infarction   . Allergy, unspecified not elsewhere classified   . Other dyspnea and respiratory abnormality   . Osteoarthrosis, unspecified whether generalized or localized, lower leg   . Other diseases of lung, not elsewhere classified   . Anemia, unspecified   . Unspecified adverse effect of other drug, medicinal and biological substance   . Blood in stool   . Unspecified paranoid state   . Degeneration of lumbar or lumbosacral intervertebral disc   . Hemorrhage of gastrointestinal tract, unspecified   . Unspecified paranoid state   . Degeneration of lumbar or lumbosacral intervertebral disc   . Hemorrhage of gastrointestinal tract, unspecified   . Personal history of other diseases of digestive system   . Personal history of peptic ulcer disease   . Anxiety state, unspecified   . Personal history of other diseases of digestive system   . Osteoarthrosis, unspecified whether generalized or localized, unspecified site   . Unspecified essential hypertension    Past Surgical History  Procedure Date  . Shoulder surgery     right  . Pilonidal cyst / sinus excision    Family History  Problem Relation Age of Onset  . Diabetes Mother   . Other Mother     cva  . Other Father     CVA   History   Social History  . Marital Status: Widowed    Spouse Name: N/A     Number of Children: N/A  . Years of Education: N/A   Occupational History  . Not on file.   Social History Main Topics  . Smoking status: Former Smoker    Quit date: 05/02/2002  . Smokeless tobacco: Not on file  . Alcohol Use:   . Drug Use:   . Sexually Active:    Other Topics Concern  . Not on file   Social History Narrative   HSGMarried: 35- 8 yrs/ divorced; married 1960- 1988/ widowed4 grandchildren2 daughters- 1950, 65; 2 sons- 1964, 17Lives alone- independent ADLs- pet cockatiel   Current Outpatient Prescriptions on File Prior to Visit  Medication Sig Dispense Refill  . ALPRAZolam (XANAX) 0.25 MG tablet Take 0.5 tablets (0.125 mg total) by mouth as directed. Up to Eight daily as needed  360 tablet  0  . clobetasol (TEMOVATE) 0.05 % cream Apply 1 application topically 2 (two) times daily. Apply to rash on left foot       . clopidogrel (PLAVIX) 75 MG tablet Take 1 tablet (75 mg total) by mouth daily.  90 tablet  1  . HYDROcodone-acetaminophen (VICODIN ES) 7.5-750 MG per tablet Take 0.5 tablets by mouth every 3 (three) hours as needed for pain. MAX DOSE is eight - half tabs daily  360 tablet  1  . ipratropium (ATROVENT) 0.06 % nasal spray Place 2 sprays into the nose 3 (three) times daily.  45 mL  1  . metoprolol (LOPRESSOR) 50 MG tablet Take 1 tablet (50 mg total) by mouth daily.  90 tablet  3  . Multiple Vitamins-Minerals (CENTRUM SILVER PO) Take 1 tablet by mouth daily.        Marland Kitchen NIFEdipine (PROCARDIA-XL/ADALAT CC) 30 MG 24 hr tablet Take 1 tablet (30 mg total) by mouth daily.  90 tablet  3  . pantoprazole (PROTONIX) 40 MG tablet Take 1 tablet (40 mg total) by mouth every morning.  90 tablet  3  . traMADol (ULTRAM) 50 MG tablet Take 1 tablet (50 mg total) by mouth every 6 (six) hours as needed.  300 tablet  3  . mirabegron ER (MYRBETRIQ) 25 MG TB24 Take 1 tablet (25 mg total) by mouth daily.  30 tablet    . simethicone (MYLICON) 80 MG chewable tablet Chew 80 mg by mouth  as needed.             Review of Systems System review is negative for any constitutional, cardiac, pulmonary, GI or neuro symptoms or complaints other than as described in the HPI.     Objective:   Physical Exam Filed Vitals:   11/22/11 1425  BP: 118/70  Pulse: 88  Temp: 98.2 F (36.8 C)  Resp: 16   gen'l - older white woman who has stooped posture and a valgus defomity of the right knee - using rolling walker for ambulation HEENT- C&S clear Cor- RRR Pulm - normal respirations. Neuro - A&O x 3, CN II-XII normal, no tremor, hindered gait by arthritic knee  Lab Results  Component Value Date   WBC 5.1 11/22/2011   HGB 13.7 11/22/2011   HCT 41.2 11/22/2011   PLT 213.0 11/22/2011   GLUCOSE 85 11/22/2011   CHOL  Value: 137        ATP III CLASSIFICATION:  <200     mg/dL   Desirable  161-096  mg/dL   Borderline High  >=045    mg/dL   High        08/08/8117   TRIG 50 11/10/2008   HDL 63 11/10/2008   LDLDIRECT 159.0 05/26/2008   LDLCALC  64     11/10/2008   ALT 15 11/22/2011   AST 25 11/22/2011   NA 137 11/22/2011   K 4.1 11/22/2011   CL 99 11/22/2011   CREATININE 0.6 11/22/2011   BUN 12 11/22/2011   CO2 30 11/22/2011   TSH 2.02 05/06/2010   INR 1.8 06/11/2009   HGBA1C 5.4 10/19/2006          Assessment & Plan:

## 2011-11-22 NOTE — Assessment & Plan Note (Signed)
Does well w/o oxygen.

## 2011-11-22 NOTE — Patient Instructions (Addendum)
You seem to be doing OK.  For the over active bladder with getting up 5 times a night please try the Myrbetriq 25 mg once a day. If this medication helps then we can send in a prescription for it.  History or GI bleeding will check a blood count today.   For your pain we will renew your prescription.

## 2011-11-22 NOTE — Assessment & Plan Note (Signed)
Hgb robust at 13.7!

## 2011-11-22 NOTE — Assessment & Plan Note (Signed)
Stable on alprazolam. Conscientious and careful with use.

## 2011-11-22 NOTE — Assessment & Plan Note (Signed)
Stable on PPI daily, Portonix 40 mg. plus GasEx.  Plan Continue meds  H/H today  Addendum: Hgb 13.7 g

## 2011-11-22 NOTE — Assessment & Plan Note (Signed)
Reports nocturia x 5 and daytime frequency. Symptoms c/w OAB  Plan Trial of Myrbetriq 25 mg once a day (#7 samples provided)

## 2011-11-22 NOTE — Assessment & Plan Note (Signed)
Bilateral shoulder problems -requires q 10 week steroid injections. Is very functional

## 2011-11-22 NOTE — Assessment & Plan Note (Signed)
BP Readings from Last 3 Encounters:  11/22/11 118/70  05/06/10 138/82  01/21/10 124/62   Good control

## 2011-11-27 ENCOUNTER — Encounter: Payer: Self-pay | Admitting: Internal Medicine

## 2011-12-08 DIAGNOSIS — IMO0002 Reserved for concepts with insufficient information to code with codable children: Secondary | ICD-10-CM | POA: Diagnosis not present

## 2011-12-09 ENCOUNTER — Telehealth: Payer: Self-pay | Admitting: Internal Medicine

## 2011-12-09 NOTE — Telephone Encounter (Signed)
Spoke with patient concerning her feet and ankles swelling again after her appt. With Dr, Debby Bud 11/22/2011. Saw her orthopedic Dr. Devonne Doughty for injection in knee and shoulder and mentioned to him about this. He suggested she call Dr. Debby Bud and let him know of this.  Concerned this swelling could be from her blood pressure medicine or nasel spray she does every morning. Today however she is fine with no swelling. Will call Monday if any swelling this week end.

## 2011-12-09 NOTE — Telephone Encounter (Signed)
The pt called and is hoping to speak with you regarding her "reaction" to her blood pressure medicine.   Thanks!

## 2011-12-10 ENCOUNTER — Other Ambulatory Visit: Payer: Self-pay | Admitting: Internal Medicine

## 2011-12-20 ENCOUNTER — Other Ambulatory Visit: Payer: Self-pay

## 2011-12-20 MED ORDER — ALPRAZOLAM 0.25 MG PO TABS: 0.1250 mg | ORAL_TABLET | ORAL | Status: DC

## 2011-12-21 NOTE — Telephone Encounter (Signed)
Rx faxed to [facility] per Sue, LPN 

## 2012-01-10 DIAGNOSIS — M25519 Pain in unspecified shoulder: Secondary | ICD-10-CM | POA: Diagnosis not present

## 2012-01-10 DIAGNOSIS — M1710 Unilateral primary osteoarthritis, unspecified knee: Secondary | ICD-10-CM | POA: Diagnosis not present

## 2012-01-20 ENCOUNTER — Other Ambulatory Visit: Payer: Self-pay | Admitting: *Deleted

## 2012-01-20 MED ORDER — HYDROCODONE-ACETAMINOPHEN 7.5-750 MG PO TABS: 0.5000 | ORAL_TABLET | ORAL | Status: DC | PRN

## 2012-01-20 NOTE — Telephone Encounter (Signed)
Rx PRINTED FOR APPROVAL TO DR. Debby Bud

## 2012-02-02 ENCOUNTER — Telehealth: Payer: Self-pay | Admitting: *Deleted

## 2012-02-02 NOTE — Telephone Encounter (Signed)
DOCUMENTATION OF PATIENT LIVING WILL CONSENT TO MEDICAL RECORDS TO BE SCANNED INTO CHART.

## 2012-02-07 DIAGNOSIS — M25569 Pain in unspecified knee: Secondary | ICD-10-CM | POA: Diagnosis not present

## 2012-02-07 DIAGNOSIS — M25519 Pain in unspecified shoulder: Secondary | ICD-10-CM | POA: Diagnosis not present

## 2012-02-10 ENCOUNTER — Telehealth: Payer: Self-pay | Admitting: Internal Medicine

## 2012-02-10 NOTE — Telephone Encounter (Signed)
Ok to work in.

## 2012-02-10 NOTE — Telephone Encounter (Signed)
The patient called hoping to be worked in Tuesday, October 15th. She states she is being trembling lately and she believes her blood sugar is dropping.   Do you want her worked in?

## 2012-02-14 ENCOUNTER — Other Ambulatory Visit (INDEPENDENT_AMBULATORY_CARE_PROVIDER_SITE_OTHER): Payer: Medicare Other

## 2012-02-14 ENCOUNTER — Encounter: Payer: Self-pay | Admitting: Internal Medicine

## 2012-02-14 ENCOUNTER — Ambulatory Visit (INDEPENDENT_AMBULATORY_CARE_PROVIDER_SITE_OTHER): Payer: Medicare Other | Admitting: Internal Medicine

## 2012-02-14 VITALS — BP 122/60 | HR 103 | Temp 98.9°F | Resp 16 | Wt 159.0 lb

## 2012-02-14 DIAGNOSIS — R259 Unspecified abnormal involuntary movements: Secondary | ICD-10-CM | POA: Diagnosis not present

## 2012-02-14 DIAGNOSIS — E162 Hypoglycemia, unspecified: Secondary | ICD-10-CM

## 2012-02-14 DIAGNOSIS — D649 Anemia, unspecified: Secondary | ICD-10-CM

## 2012-02-14 DIAGNOSIS — I10 Essential (primary) hypertension: Secondary | ICD-10-CM | POA: Diagnosis not present

## 2012-02-14 DIAGNOSIS — R209 Unspecified disturbances of skin sensation: Secondary | ICD-10-CM | POA: Diagnosis not present

## 2012-02-14 DIAGNOSIS — E161 Other hypoglycemia: Secondary | ICD-10-CM

## 2012-02-14 DIAGNOSIS — Z23 Encounter for immunization: Secondary | ICD-10-CM | POA: Diagnosis not present

## 2012-02-14 DIAGNOSIS — R202 Paresthesia of skin: Secondary | ICD-10-CM

## 2012-02-14 DIAGNOSIS — R251 Tremor, unspecified: Secondary | ICD-10-CM

## 2012-02-14 DIAGNOSIS — F411 Generalized anxiety disorder: Secondary | ICD-10-CM

## 2012-02-14 LAB — CBC WITH DIFFERENTIAL/PLATELET
Basophils Relative: 0.1 % (ref 0.0–3.0)
Eosinophils Absolute: 0 10*3/uL (ref 0.0–0.7)
Eosinophils Relative: 0.3 % (ref 0.0–5.0)
HCT: 47.9 % — ABNORMAL HIGH (ref 36.0–46.0)
Lymphs Abs: 1.2 10*3/uL (ref 0.7–4.0)
MCHC: 33.2 g/dL (ref 30.0–36.0)
MCV: 99.3 fl (ref 78.0–100.0)
Monocytes Absolute: 0.8 10*3/uL (ref 0.1–1.0)
Neutro Abs: 7.7 10*3/uL (ref 1.4–7.7)
Neutrophils Relative %: 79.4 % — ABNORMAL HIGH (ref 43.0–77.0)
RBC: 4.83 Mil/uL (ref 3.87–5.11)
WBC: 9.7 10*3/uL (ref 4.5–10.5)

## 2012-02-14 LAB — HEMOGLOBIN A1C: Hgb A1c MFr Bld: 5.3 % (ref 4.6–6.5)

## 2012-02-14 MED ORDER — SERTRALINE HCL 50 MG PO TABS: 50.0000 mg | ORAL_TABLET | Freq: Every day | ORAL | Status: DC

## 2012-02-14 NOTE — Progress Notes (Signed)
Subjective:    Patient ID: Jacqueline [name], female    DOB: June 07, 1928, 78 y.o.   MRN: [id number]  HPI Mrs. Truxillo presents for multiple complaints: She has Calcium deposits in her shoulders and has had recent cortisone injection to both shoulders and to right knee. She is having progressive pain and loss of mobility. She is having injections every 4-6 weeks. (Dr. Ranell Patrick). She will have shakiness for about a week but then will have 4-5 weeks of relief.   In addition, she will have a bout of trembling starting mid-day and then she is relieved by something sweet, with symptoms only to return in a brief period of time. She is very concerned about diabetes: serum glucose has been normal last 2 years but she has been craving sweets and feels trembly. Her mother developed diabetes late in life and a 79 y/o sister has developed diabetes.   She does have increased anxiety especially with having to go out or prepare for doctors visit. She is taking xanax on a daily basis and has for years. She did ask about habituation/addiction to the xanax.   Past Medical History  Diagnosis Date  . Unspecified cerebral artery occlusion with cerebral infarction   . Allergy, unspecified not elsewhere classified   . Other dyspnea and respiratory abnormality   . Osteoarthrosis, unspecified whether generalized or localized, lower leg   . Other diseases of lung, not elsewhere classified   . Anemia, unspecified   . Unspecified adverse effect of other drug, medicinal and biological substance(995.29)   . Blood in stool   . Unspecified paranoid state   . Degeneration of lumbar or lumbosacral intervertebral disc   . Hemorrhage of gastrointestinal tract, unspecified   . Unspecified paranoid state   . Degeneration of lumbar or lumbosacral intervertebral disc   . Hemorrhage of gastrointestinal tract, unspecified   . Personal history of other diseases of digestive system   . Personal history of peptic ulcer disease   . Anxiety  state, unspecified   . Personal history of other diseases of digestive system   . Osteoarthrosis, unspecified whether generalized or localized, unspecified site   . Unspecified essential hypertension    Past Surgical History  Procedure Date  . Shoulder surgery     right  . Pilonidal cyst / sinus excision    Family History  Problem Relation Age of Onset  . Diabetes Mother   . Other Mother     cva  . Other Father     CVA   History   Social History  . Marital Status: Widowed    Spouse Name: N/A    Number of Children: 4  . Years of Education: 12   Occupational History  . retired    Social History Main Topics  . Smoking status: Former Smoker    Quit date: 05/02/2002  . Smokeless tobacco: Never Used  . Alcohol Use: No  . Drug Use: No  . Sexually Active: No   Other Topics Concern  . Not on file   Social History Narrative   HSG. Married: 59- 8 yrs/ divorced; married 1960- 1988/ widowed. 4 grandchildren. 2 daughters- 82, 21; 2 sons- 46, 33. Lives alone- independent ADLs- pet cockatiel.    Current Outpatient Prescriptions on File Prior to Visit  Medication Sig Dispense Refill  . ALPRAZolam (XANAX) 0.25 MG tablet Take 0.5 tablets (0.125 mg total) by mouth as directed. Up to Eight daily as needed  360 tablet  0  . clopidogrel (PLAVIX) 75  MG tablet Take 1 tablet (75 mg total) by mouth daily.  90 tablet  1  . HYDROcodone-acetaminophen (VICODIN ES) 7.5-750 MG per tablet Take 0.5 tablets by mouth every 3 (three) hours as needed for pain. MAX DOSE is eight - half tabs daily  360 tablet  1  . ipratropium (ATROVENT) 0.06 % nasal spray Place 2 sprays into the nose 3 (three) times daily.  45 mL  1  . metoprolol (LOPRESSOR) 50 MG tablet Take 1 tablet (50 mg total) by mouth daily.  90 tablet  3  . mirabegron ER (MYRBETRIQ) 25 MG TB24 Take 1 tablet (25 mg total) by mouth daily.  30 tablet    . Multiple Vitamins-Minerals (CENTRUM SILVER PO) Take 1 tablet by mouth daily.          Marland Kitchen NIFEdipine (PROCARDIA-XL/ADALAT CC) 30 MG 24 hr tablet Take 1 tablet (30 mg total) by mouth daily.  90 tablet  3  . pantoprazole (PROTONIX) 40 MG tablet Take 1 tablet (40 mg total) by mouth every morning.  90 tablet  3  . simethicone (MYLICON) 80 MG chewable tablet Chew 80 mg by mouth as needed.        . traMADol (ULTRAM) 50 MG tablet TAKE 1 TABLET EVERY 6 HOURS AS NEEDED  300 tablet  3  . clobetasol (TEMOVATE) 0.05 % cream Apply 1 application topically 2 (two) times daily. Apply to rash on left foot           Review of Systems System review is negative for any constitutional, cardiac, pulmonary, GI or neuro symptoms or complaints other than as described in the HPI.     Objective:   Physical Exam Filed Vitals:   02/14/12 1629  BP: 122/60  Pulse: 103  Temp: 98.9 F (37.2 C)  Resp: 16   Wt Readings from Last 3 Encounters:  02/14/12 159 lb (72.122 kg)  11/22/11 159 lb (72.122 kg)  05/06/10 153 lb (69.4 kg)   Gen'l - overweight white woman in no distress but anxious HEENT - C&S clear Cor - regular tachycardia with PVCs, bigeminy Pulm - normal respirations, no rales or wheezes Neuro - A&O x 3, rapid speech, anxious.       Assessment & Plan:  Trembling  - symptoms are alleviated by sweets. No history of glucose metabolism abnormality and has had normal serum glucose levels  Plan Lab: A1C to assess long term glucose levels.  Dietary - avoid concentrated sweets due to possible reactive hypoglycemia and focus on protein heavy snacks.

## 2012-02-14 NOTE — Patient Instructions (Addendum)
Shakiness or trembling that is relieved by something sweet only to be followed shortly by recurrent symptoms is suggestive of low blood sugar. The sugar load give quick but not durable relief.  Plan Will check lab to include blood sugar, long term blood sugar, thyroid function and B12  You need to cut way back on concentrated sweets and use more durable protein snacks when you have a shaky feeling - like nuts, peanut putter.  Anxiety - you are most likely habitually anxious and short acting xanax may not be the best medication.  Plan  start zoloft - a medication taken once a day that is very efffective for anxiety.  After being on zoloft for 1 week please start to cut back on the xanax: going to 3 times a day for 5 days; then 2 times a day for 5 days; then once a day as needed.

## 2012-02-15 ENCOUNTER — Telehealth: Payer: Self-pay | Admitting: *Deleted

## 2012-02-15 LAB — COMPREHENSIVE METABOLIC PANEL
AST: 25 U/L (ref 0–37)
Albumin: 4.2 g/dL (ref 3.5–5.2)
Alkaline Phosphatase: 66 U/L (ref 39–117)
BUN: 20 mg/dL (ref 6–23)
Creatinine, Ser: 0.8 mg/dL (ref 0.4–1.2)
Glucose, Bld: 100 mg/dL — ABNORMAL HIGH (ref 70–99)
Potassium: 4.2 mEq/L (ref 3.5–5.1)
Total Bilirubin: 0.9 mg/dL (ref 0.3–1.2)

## 2012-02-15 LAB — VITAMIN B12: Vitamin B-12: 478 pg/mL (ref 211–911)

## 2012-02-15 LAB — TSH: TSH: 1.36 u[IU]/mL (ref 0.35–5.50)

## 2012-02-15 NOTE — Assessment & Plan Note (Addendum)
Long standing anxiety disorder now with increasing symptoms and loss of efficacy of alprazolam. Advised her that she is habituated to alprazolam.  Plan  start zoloft - a medication taken once a day that is very efffective for anxiety.  After being on zoloft for 1 week please start to cut back on the xanax: going to 3 times a day for 5 days; then 2 times a day for 5 days; then once a day as needed.

## 2012-02-15 NOTE — Telephone Encounter (Signed)
Patient called concern of the medication Zoloft or generic of Zoloft that was prescribed yesterday. Patient read all the information concerning this medication and talked with pharmacist and was told should not take Generic for zoloft  Alone with her tramadol. Should she cut back on tramadol. CB # [phone]

## 2012-02-16 NOTE — Telephone Encounter (Signed)
Called patient about medication. Jacqueline [name] states after taking 1 Zoloft as you suggested she did not like the way that made her feel and will not take that anymore. She states she will continue to her xanax and her tramadol like she has always done.. She says to tell you no hard feelings about this.

## 2012-02-16 NOTE — Telephone Encounter (Signed)
Patient states she will not take the zoloft. She states she took just 1 and did not like the way she felt. She would like to continue taking alprazolam. Pt could like a call back from [place] regarding this.

## 2012-02-16 NOTE — Telephone Encounter (Signed)
We appreciate the pharmacist helping [place] to practice medicine.  Please take the zoloft as instructed. It is ok to use the tramadol as needed for pain.

## 2012-02-17 ENCOUNTER — Encounter: Payer: Self-pay | Admitting: Internal Medicine

## 2012-02-24 ENCOUNTER — Other Ambulatory Visit: Payer: Self-pay | Admitting: Internal Medicine

## 2012-03-09 ENCOUNTER — Other Ambulatory Visit: Payer: Self-pay | Admitting: Internal Medicine

## 2012-03-12 ENCOUNTER — Telehealth: Payer: Self-pay | Admitting: *Deleted

## 2012-03-12 NOTE — Telephone Encounter (Signed)
REFILL ON MEDICATION TO RIGHT SOURCE MAIL ORDER FOR ALPRAZOLAM. PATIENT NOTIFIED.

## 2012-03-13 ENCOUNTER — Encounter (HOSPITAL_COMMUNITY): Payer: Self-pay | Admitting: General Practice

## 2012-03-13 ENCOUNTER — Inpatient Hospital Stay (HOSPITAL_COMMUNITY): Payer: Medicare Other

## 2012-03-13 ENCOUNTER — Encounter: Payer: Self-pay | Admitting: Internal Medicine

## 2012-03-13 ENCOUNTER — Ambulatory Visit (INDEPENDENT_AMBULATORY_CARE_PROVIDER_SITE_OTHER): Payer: Medicare Other | Admitting: Internal Medicine

## 2012-03-13 ENCOUNTER — Inpatient Hospital Stay (HOSPITAL_COMMUNITY)
Admission: AD | Admit: 2012-03-13 | Discharge: 2012-03-15 | DRG: 309 | Disposition: A | Discharge status: Home / Self Care | Payer: Medicare Other | Source: Ambulatory Visit | Attending: Internal Medicine | Admitting: Internal Medicine

## 2012-03-13 VITALS — BP 124/62 | HR 65 | Temp 97.9°F | Ht 62.0 in | Wt 158.8 lb

## 2012-03-13 DIAGNOSIS — Z136 Encounter for screening for cardiovascular disorders: Secondary | ICD-10-CM | POA: Diagnosis not present

## 2012-03-13 DIAGNOSIS — K449 Diaphragmatic hernia without obstruction or gangrene: Secondary | ICD-10-CM | POA: Diagnosis present

## 2012-03-13 DIAGNOSIS — Z79899 Other long term (current) drug therapy: Secondary | ICD-10-CM | POA: Diagnosis not present

## 2012-03-13 DIAGNOSIS — Z8719 Personal history of other diseases of the digestive system: Secondary | ICD-10-CM | POA: Diagnosis not present

## 2012-03-13 DIAGNOSIS — M5137 Other intervertebral disc degeneration, lumbosacral region: Secondary | ICD-10-CM | POA: Diagnosis present

## 2012-03-13 DIAGNOSIS — Z886 Allergy status to analgesic agent status: Secondary | ICD-10-CM | POA: Diagnosis not present

## 2012-03-13 DIAGNOSIS — F411 Generalized anxiety disorder: Secondary | ICD-10-CM | POA: Diagnosis present

## 2012-03-13 DIAGNOSIS — I248 Other forms of acute ischemic heart disease: Secondary | ICD-10-CM | POA: Diagnosis present

## 2012-03-13 DIAGNOSIS — Z880 Allergy status to penicillin: Secondary | ICD-10-CM | POA: Diagnosis not present

## 2012-03-13 DIAGNOSIS — J309 Allergic rhinitis, unspecified: Secondary | ICD-10-CM | POA: Diagnosis present

## 2012-03-13 DIAGNOSIS — M1990 Unspecified osteoarthritis, unspecified site: Secondary | ICD-10-CM

## 2012-03-13 DIAGNOSIS — Z87891 Personal history of nicotine dependence: Secondary | ICD-10-CM

## 2012-03-13 DIAGNOSIS — I4891 Unspecified atrial fibrillation: Secondary | ICD-10-CM | POA: Diagnosis not present

## 2012-03-13 DIAGNOSIS — Z888 Allergy status to other drugs, medicaments and biological substances status: Secondary | ICD-10-CM

## 2012-03-13 DIAGNOSIS — M1710 Unilateral primary osteoarthritis, unspecified knee: Secondary | ICD-10-CM | POA: Diagnosis present

## 2012-03-13 DIAGNOSIS — I10 Essential (primary) hypertension: Secondary | ICD-10-CM | POA: Diagnosis not present

## 2012-03-13 DIAGNOSIS — I2489 Other forms of acute ischemic heart disease: Secondary | ICD-10-CM | POA: Diagnosis present

## 2012-03-13 DIAGNOSIS — Z7902 Long term (current) use of antithrombotics/antiplatelets: Secondary | ICD-10-CM | POA: Diagnosis not present

## 2012-03-13 DIAGNOSIS — Z8673 Personal history of transient ischemic attack (TIA), and cerebral infarction without residual deficits: Secondary | ICD-10-CM

## 2012-03-13 DIAGNOSIS — Z8711 Personal history of peptic ulcer disease: Secondary | ICD-10-CM

## 2012-03-13 DIAGNOSIS — I369 Nonrheumatic tricuspid valve disorder, unspecified: Secondary | ICD-10-CM | POA: Diagnosis not present

## 2012-03-13 DIAGNOSIS — I110 Hypertensive heart disease with heart failure: Secondary | ICD-10-CM | POA: Diagnosis present

## 2012-03-13 DIAGNOSIS — M51379 Other intervertebral disc degeneration, lumbosacral region without mention of lumbar back pain or lower extremity pain: Secondary | ICD-10-CM | POA: Diagnosis present

## 2012-03-13 DIAGNOSIS — F419 Anxiety disorder, unspecified: Secondary | ICD-10-CM | POA: Diagnosis present

## 2012-03-13 DIAGNOSIS — G8929 Other chronic pain: Secondary | ICD-10-CM | POA: Diagnosis present

## 2012-03-13 HISTORY — DX: Personal history of other diseases of the digestive system: Z87.19

## 2012-03-13 HISTORY — DX: Unspecified atrial fibrillation: I48.91

## 2012-03-13 HISTORY — DX: Gastro-esophageal reflux disease without esophagitis: K21.9

## 2012-03-13 HISTORY — DX: Other complications of anesthesia, initial encounter: T88.59XA

## 2012-03-13 HISTORY — DX: Adverse effect of unspecified anesthetic, initial encounter: T41.45XA

## 2012-03-13 LAB — COMPREHENSIVE METABOLIC PANEL
Albumin: 4.3 g/dL (ref 3.5–5.2)
Alkaline Phosphatase: 72 U/L (ref 39–117)
BUN: 20 mg/dL (ref 6–23)
CO2: 26 mEq/L (ref 19–32)
Chloride: 98 mEq/L (ref 96–112)
Glucose, Bld: 117 mg/dL — ABNORMAL HIGH (ref 70–99)
Potassium: 4.1 mEq/L (ref 3.5–5.1)
Total Bilirubin: 0.7 mg/dL (ref 0.3–1.2)

## 2012-03-13 LAB — CBC WITH DIFFERENTIAL/PLATELET
Basophils Absolute: 0 10*3/uL (ref 0.0–0.1)
Basophils Relative: 0 % (ref 0–1)
Eosinophils Relative: 1 % (ref 0–5)
HCT: 45.8 % (ref 36.0–46.0)
MCHC: 34.3 g/dL (ref 30.0–36.0)
Monocytes Absolute: 0.7 10*3/uL (ref 0.1–1.0)
Neutro Abs: 5.2 10*3/uL (ref 1.7–7.7)
RDW: 14.3 % (ref 11.5–15.5)

## 2012-03-13 LAB — MAGNESIUM: Magnesium: 2.3 mg/dL (ref 1.5–2.5)

## 2012-03-13 MED ORDER — SODIUM CHLORIDE 0.9 % IJ SOLN
3.0000 mL | Freq: Two times a day (BID) | INTRAMUSCULAR | Status: DC
  Administered 2012-03-15: 3 mL via INTRAVENOUS

## 2012-03-13 MED ORDER — SERTRALINE HCL 50 MG PO TABS
50.0000 mg | ORAL_TABLET | Freq: Every day | ORAL | Status: DC
  Filled 2012-03-13 (×2): qty 1

## 2012-03-13 MED ORDER — METOPROLOL TARTRATE 25 MG PO TABS
25.0000 mg | ORAL_TABLET | Freq: Four times a day (QID) | ORAL | Status: DC
  Administered 2012-03-13 – 2012-03-15 (×7): 25 mg via ORAL
  Filled 2012-03-13 (×10): qty 1

## 2012-03-13 MED ORDER — HYDROCODONE-ACETAMINOPHEN 7.5-750 MG PO TABS: 0.5000 | ORAL_TABLET | ORAL | Status: DC | PRN

## 2012-03-13 MED ORDER — ONDANSETRON HCL 4 MG/2ML IJ SOLN: 4.0000 mg | Freq: Four times a day (QID) | INTRAMUSCULAR | Status: DC | PRN

## 2012-03-13 MED ORDER — SENNA 8.6 MG PO TABS
1.0000 | ORAL_TABLET | Freq: Two times a day (BID) | ORAL | Status: DC
  Administered 2012-03-13 – 2012-03-15 (×4): 8.6 mg via ORAL
  Filled 2012-03-13 (×5): qty 1

## 2012-03-13 MED ORDER — DILTIAZEM LOAD VIA INFUSION
10.0000 mg | Freq: Once | INTRAVENOUS | Status: AC
  Administered 2012-03-13: 10 mg via INTRAVENOUS
  Filled 2012-03-13: qty 10

## 2012-03-13 MED ORDER — SODIUM CHLORIDE 0.9 % IJ SOLN
3.0000 mL | Freq: Two times a day (BID) | INTRAMUSCULAR | Status: DC
  Administered 2012-03-14 – 2012-03-15 (×2): 3 mL via INTRAVENOUS

## 2012-03-13 MED ORDER — ONDANSETRON HCL 4 MG PO TABS: 4.0000 mg | ORAL_TABLET | Freq: Four times a day (QID) | ORAL | Status: DC | PRN

## 2012-03-13 MED ORDER — SODIUM CHLORIDE 0.9 % IJ SOLN: 3.0000 mL | INTRAMUSCULAR | Status: DC | PRN

## 2012-03-13 MED ORDER — HEPARIN BOLUS VIA INFUSION
2000.0000 [IU] | Freq: Once | INTRAVENOUS | Status: AC
  Administered 2012-03-13: 2000 [IU] via INTRAVENOUS
  Filled 2012-03-13: qty 2000

## 2012-03-13 MED ORDER — PANTOPRAZOLE SODIUM 40 MG PO TBEC
40.0000 mg | DELAYED_RELEASE_TABLET | Freq: Every day | ORAL | Status: DC
  Administered 2012-03-14 – 2012-03-15 (×2): 40 mg via ORAL
  Filled 2012-03-13 (×2): qty 1

## 2012-03-13 MED ORDER — TRAMADOL HCL 50 MG PO TABS: 50.0000 mg | ORAL_TABLET | Freq: Four times a day (QID) | ORAL | Status: DC | PRN

## 2012-03-13 MED ORDER — HYDROCODONE-ACETAMINOPHEN 5-325 MG PO TABS
1.0000 | ORAL_TABLET | ORAL | Status: DC | PRN
  Administered 2012-03-13 – 2012-03-15 (×11): 1 via ORAL
  Filled 2012-03-13 (×11): qty 1

## 2012-03-13 MED ORDER — METOPROLOL TARTRATE 50 MG PO TABS
50.0000 mg | ORAL_TABLET | Freq: Every day | ORAL | Status: DC
  Filled 2012-03-13: qty 1

## 2012-03-13 MED ORDER — MIRABEGRON ER 25 MG PO TB24: 25.0000 mg | ORAL_TABLET | Freq: Every day | ORAL | Status: DC

## 2012-03-13 MED ORDER — HEPARIN (PORCINE) IN NACL 100-0.45 UNIT/ML-% IJ SOLN
1100.0000 [IU]/h | INTRAMUSCULAR | Status: DC
  Administered 2012-03-13: 1000 [IU]/h via INTRAVENOUS
  Filled 2012-03-13: qty 250

## 2012-03-13 MED ORDER — DEXTROSE 5 % IV SOLN
5.0000 mg/h | INTRAVENOUS | Status: DC
  Administered 2012-03-13: 5 mg/h via INTRAVENOUS
  Filled 2012-03-13: qty 100

## 2012-03-13 MED ORDER — ALPRAZOLAM 0.25 MG PO TABS
0.1250 mg | ORAL_TABLET | ORAL | Status: DC | PRN
  Administered 2012-03-13 – 2012-03-14 (×2): 0.125 mg via ORAL
  Filled 2012-03-13 (×2): qty 1

## 2012-03-13 MED ORDER — SODIUM CHLORIDE 0.9 % IV SOLN: 250.0000 mL | INTRAVENOUS | Status: DC | PRN

## 2012-03-13 NOTE — Progress Notes (Signed)
  Subjective:    Patient ID: Jacqueline [name], female    DOB: 10-07-1928, 78 y.o.   MRN: [id number]  HPI Jacqueline [name] was at the orthopedics office today for cortisone injections shoulders and knee. She was noted to have a very rapid, irregular heart rate. She has been feeling light-headed and dizzy, she has been short of breath but denies chest pain. She has noted fluttering heart beat that she associates with the steroid injections.   She does complain of a raspy cough and laryngitis. She does have a sore throat. No fever or chills. Cough drops do help.  She is admitted directly to [facility] tele bed for treatment and further evaluation.   Review of Systems     Objective:   Physical Exam        Assessment & Plan:

## 2012-03-13 NOTE — Progress Notes (Signed)
ANTICOAGULATION CONSULT NOTE - Initial Consult  Pharmacy Consult for  Heparin Indication: atrial fibrillation  Allergies  Allergen Reactions  . Aspirin     Upset stomach  . Metoclopramide Hcl     REACTION: Tremors  . Penicillins     itching   Patient Measurements: Heparin Dosing Weight:72 kg  Vital Signs: Temp: 98.5 F (36.9 C) (11/12 1952) Temp src: Oral (11/12 1952) BP: 111/69 mmHg (11/12 1952) Pulse Rate: 102  (11/12 1952)  Labs:  [facility] 03/13/12 2000  HGB 15.7*  HCT 45.8  PLT 245  APTT --  LABPROT --  INR --  HEPARINUNFRC --  CREATININE --  CKTOTAL --  CKMB --  TROPONINI --   Medical History: Past Medical History  Diagnosis Date  . Unspecified cerebral artery occlusion with cerebral infarction   . Allergy, unspecified not elsewhere classified   . Other dyspnea and respiratory abnormality   . Osteoarthrosis, unspecified whether generalized or localized, lower leg   . Other diseases of lung, not elsewhere classified   . Anemia, unspecified   . Unspecified adverse effect of other drug, medicinal and biological substance(995.29)   . Blood in stool   . Unspecified paranoid state   . Degeneration of lumbar or lumbosacral intervertebral disc   . Hemorrhage of gastrointestinal tract, unspecified   . Unspecified paranoid state   . Degeneration of lumbar or lumbosacral intervertebral disc   . Hemorrhage of gastrointestinal tract, unspecified   . Personal history of other diseases of digestive system   . Personal history of peptic ulcer disease   . Anxiety state, unspecified   . Personal history of other diseases of digestive system   . Osteoarthrosis, unspecified whether generalized or localized, unspecified site   . Unspecified essential hypertension    Medications:  Prescriptions prior to admission  Medication Sig Dispense Refill  . ALPRAZolam (XANAX) 0.25 MG tablet Take 0.5 mg by mouth daily. As directed. Up to 8 daily as needed for anxiety      .  clopidogrel (PLAVIX) 75 MG tablet Take 1 tablet (75 mg total) by mouth daily.  90 tablet  1  . HYDROcodone-acetaminophen (VICODIN ES) 7.5-750 MG per tablet Take 0.5 tablets by mouth every 3 (three) hours as needed for pain. MAX DOSE is eight - half tabs daily  360 tablet  1  . ipratropium (ATROVENT) 0.06 % nasal spray Place 2 sprays into the nose 3 (three) times daily.  45 mL  1  . metoprolol (LOPRESSOR) 50 MG tablet Take 1 tablet (50 mg total) by mouth daily.  90 tablet  3  . mirabegron ER (MYRBETRIQ) 25 MG TB24 Take 25 mg by mouth daily.      . Multiple Vitamins-Minerals (CENTRUM SILVER PO) Take 1 tablet by mouth daily.      Marland Kitchen NIFEdipine (PROCARDIA-XL/ADALAT CC) 30 MG 24 hr tablet Take 1 tablet (30 mg total) by mouth daily.  90 tablet  3  . OMEPRAZOLE PO Take 1 tablet by mouth every evening.      . pantoprazole (PROTONIX) 40 MG tablet TAKE 1 TABLET EVERY MORNING  90 tablet  2  . Simethicone (GAS-X EXTRA STRENGTH) 125 MG CAPS Take 1 capsule by mouth daily.      . traMADol (ULTRAM) 50 MG tablet Take 50 mg by mouth every 6 (six) hours as needed. For pain        Assessment: 78 yo female admitted with a fast heart rate which was irregular.  She is to be started  on IV heparin for new onset Afib.  She has a past medical history that is positive for hemorrhage of gastrointestinal tract with peptic ulcer disease.  Currently her hemoglobin is stable as well as her platelets.  She has no s/s of bleeding complications.  Goal of Therapy:  Heparin level 0.3-0.7 units/ml Monitor platelets by anticoagulation protocol: Yes   Plan:  Begin IV heparin with 2000 units IV bolus and start maintenance infusion at 1000 units/hr. Obtain a heparin level 8 hours after starting and then daily. Monitor closely for s/s of bleeding.  Nadara Mustard, PharmD., MS Clinical Pharmacist Pager:  [phone] Thank you for allowing pharmacy to be part of this patients care team. 03/13/2012,9:03 PM

## 2012-03-13 NOTE — Consult Note (Signed)
CARDIOLOGY CONSULT NOTE  Patient ID: Jacqueline [name] MRN: [id number] DOB/AGE: 76-May-1930 76 y.o.  Admit date: 03/13/2012 Primary Physician: Norins Reason for Consultation: Atrial fibrillation with RVR  HPI: 78 yo with history of CVA in 7/10 and OA presented with atrial fibrillation with rapid ventricular response.  Patient was in her usual state of health until this morning.  At some point this morning, she developed diaphoresis and started to feel lightheaded.  She went to her orthopedist's office today (was supposed to get steroid injections in her knee and shoulder).  She was noted to have a high heart rate and was sent to Dr. Debby Bud' office, where she was noted to have atrial fibrillation with RVR, rate 151.  She was admitted to [facility] and started on diltiazem gtt and heparin gtt.  She does not feel palpitations.  She has not felt chest pain or dyspnea.  Initial troponin was elevated at 0.32.  HR is now in the 100s on diltiazem gtt.   Prior to today, patient had been at baseline symptomatically.  She lives alone.  She walks with a walker due to knee pain.  No significant dyspnea though she is not very active.  She has slept on several pillows for a long time.  She does seem to have some memory trouble.   Review of systems complete and found to be negative unless listed above in HPI  Past Medical History: 1. CVA: 7/10 right occipital posterior parietal region involvement on MRI.  Carotid dopplers (7/10) with no significant stenosis.  The CVA was thought to be embolic and she was on coumadin for a period of time afterwards.  She stopped coumadin due to easy bleeding and general intolerance.  2. Echo (7/10): EF 60-65%, possible bicuspid aortic valve without significant stenosis, mild to moderate TR, PA systolic pressure 31 mmHg.  3. Allergic rhinitis 4. PUD: Gastric ulcer years ago while on Celebrex.  On PPI now.  5. OA 6. Atrial fibrillation:  Diagnosis 03/13/12.  7. Hiatral  hernia  Family History  Problem Relation Age of Onset  . Diabetes Mother   . Other Mother     cva  . Other Father     CVA    History   Social History  . Marital Status: Widowed    Spouse Name: N/A    Number of Children: 4  . Years of Education: 12   Occupational History  . retired    Social History Main Topics  . Smoking status: Former Smoker    Quit date: 05/02/2002  . Smokeless tobacco: Never Used  . Alcohol Use: No  . Drug Use: No  . Sexually Active: No   Other Topics Concern  . Not on file   Social History Narrative   HSG. Married: 34- 8 yrs/ divorced; married 1960- 1988/ widowed. 4 grandchildren. 2 daughters- 86, 52; 2 sons- 5, 43. Lives alone- independent ADLs- pet cockatiel.     Prescriptions prior to admission  Medication Sig Dispense Refill  . ALPRAZolam (XANAX) 0.25 MG tablet Take 0.5 mg by mouth daily. As directed. Up to 8 daily as needed for anxiety      . clopidogrel (PLAVIX) 75 MG tablet Take 1 tablet (75 mg total) by mouth daily.  90 tablet  1  . HYDROcodone-acetaminophen (VICODIN ES) 7.5-750 MG per tablet Take 0.5 tablets by mouth every 3 (three) hours as needed for pain. MAX DOSE is eight - half tabs daily  360 tablet  1  .  ipratropium (ATROVENT) 0.06 % nasal spray Place 2 sprays into the nose 3 (three) times daily.  45 mL  1  . metoprolol (LOPRESSOR) 50 MG tablet Take 1 tablet (50 mg total) by mouth daily.  90 tablet  3  . Multiple Vitamins-Minerals (CENTRUM SILVER PO) Take 1 tablet by mouth daily.      Marland Kitchen NIFEdipine (PROCARDIA-XL/ADALAT CC) 30 MG 24 hr tablet Take 1 tablet (30 mg total) by mouth daily.  90 tablet  3  . OMEPRAZOLE PO Take 1 tablet by mouth every evening.      . pantoprazole (PROTONIX) 40 MG tablet TAKE 1 TABLET EVERY MORNING  90 tablet  2  . Simethicone (GAS-X EXTRA STRENGTH) 125 MG CAPS Take 1 capsule by mouth daily.      . traMADol (ULTRAM) 50 MG tablet Take 50 mg by mouth every 6 (six) hours as needed. For pain          Inpatient medications . [COMPLETED] diltiazem  10 mg Intravenous Once  . heparin  2,000 Units Intravenous Once  . metoprolol tartrate  25 mg Oral Q6H  . pantoprazole  40 mg Oral Daily  . senna  1 tablet Oral BID  . sertraline  50 mg Oral Daily  . sodium chloride  3 mL Intravenous Q12H  . sodium chloride  3 mL Intravenous Q12H  . [DISCONTINUED] metoprolol  50 mg Oral Daily  . [DISCONTINUED] mirabegron ER  25 mg Oral Daily  Diltiazem gtt    Physical exam Blood pressure 111/69, pulse 102, temperature 98.5 F (36.9 C), temperature source Oral, resp. rate 20, SpO2 98.00%. General: NAD Neck: Thick, no JVD, no thyromegaly or thyroid nodule.  Lungs: Clear to auscultation bilaterally with normal respiratory effort. CV: Nondisplaced PMI.  Heart mildly tachy, irregular S1/S2, no S3/S4, 1/6 SEM RUSB.  No peripheral edema.  No carotid bruit.  Normal pedal pulses.  Abdomen: Soft, nontender, no hepatosplenomegaly, no distention.  Skin: Intact without lesions or rashes.  Neurologic: Alert and oriented x 3.  Psych: Normal affect. Extremities: No clubbing or cyanosis.  HEENT: Normal.   Labs:   Lab Results  Component Value Date   WBC 8.1 03/13/2012   HGB 15.7* 03/13/2012   HCT 45.8 03/13/2012   MCV 96.6 03/13/2012   PLT 245 03/13/2012   Troponin 0.32  BMET    Component Value Date/Time   NA 137 03/13/2012 2000   K 4.1 03/13/2012 2000   CL 98 03/13/2012 2000   CO2 26 03/13/2012 2000   GLUCOSE 117* 03/13/2012 2000   BUN 20 03/13/2012 2000   CREATININE 0.87 03/13/2012 2000   CALCIUM 10.4 03/13/2012 2000   GFRNONAA 61* 03/13/2012 2000   GFRAA 70* 03/13/2012 2000    EKG: atrial fibrillation with RVR, nonspecific lateral ST changes, LVH  CXR: Stable cardiomegaly, hiatal hernia  ASSESSMENT AND PLAN:  78 yo with history of CVA in 7/10 and OA presented with atrial fibrillation with rapid ventricular response.  1. Atrial fibrillation: HR to 150s this afternoon on initial  presentation, now in 100s on diltiazem gtt.  She feels better with HR slower (no longer diaphoretic or lightheaded).  This is her first recognized episode of atrial fibrillation.  She may have had paroxysmal atrial fibrillation in the past as she had a presumed embolic CVA in 2010.  - Continue diltiazem gtt for now for rate control. - Add metoprolol 25 mg po q6 hrs - heparin gtt for now.  - I talked to her at length  about anticoagulation (CHADSVASC = 6).  She had a hard time with coumadin in the past and does not want to take this.  She likes Plavix (which she has been on at home).  I recommended Eliquis or Xarelto to replace Plavix.  She seems fairly stable without recent falls while using her walker.  She wants to think about this and talk to Dr. Debby Bud.  If she decides to start Eliquis or Xarelto, would stop heparin gtt and start that medication (would also stop Plavix).   - If she does not come out of atrial fibrillation on her own, would consider TEE-guided DCCV for first recognized episode of atrial fibrillation versus DCCV after 1 month anticoagulation if her HR is well-controlled.  - Echocardiogram - Check TSH 2. Elevated troponin: No chest pain.  Most likely this represents demand ischemia from atrial fibrillation with RVR rather than true ACS with plaque rupture.  Will cycle cardiac enzymes.  If there is not a significant upward trend, outpatient myoview would be reasonable.  If there is a significant upward trend, would consider cardiac cath.     Signed: [name] 03/13/2012 10:35 PM

## 2012-03-13 NOTE — H&P (Signed)
Jacqueline [name] is an 78 y.o. female.   Chief Complaint: Rapid heart rate HPI: Jacqueline [name] was at the orthopedist's office for shoulder and knee injections. She was noted to have a very fast heart, irregular heart rate and was sent to the office. EKG revealed a. Fib with RVR. She reports that she has had some rapid heart rate episodes usually after her steroid injections. She denies any chest pain but admits to increased SOB over the past several weeks. In the office she is hemodynamically stable, in no pain and not short of breath at rest. She is admitted for IV diltiazem, diagnostic eval including 2 D echo, serial cardiac enzymes and appropriate labs. Cardiology consultation will be obtained.  Past Medical History  Diagnosis Date  . Unspecified cerebral artery occlusion with cerebral infarction   . Allergy, unspecified not elsewhere classified   . Other dyspnea and respiratory abnormality   . Osteoarthrosis, unspecified whether generalized or localized, lower leg   . Other diseases of lung, not elsewhere classified   . Anemia, unspecified   . Unspecified adverse effect of other drug, medicinal and biological substance(995.29)   . Blood in stool   . Unspecified paranoid state   . Degeneration of lumbar or lumbosacral intervertebral disc   . Hemorrhage of gastrointestinal tract, unspecified   . Unspecified paranoid state   . Degeneration of lumbar or lumbosacral intervertebral disc   . Hemorrhage of gastrointestinal tract, unspecified   . Personal history of other diseases of digestive system   . Personal history of peptic ulcer disease   . Anxiety state, unspecified   . Personal history of other diseases of digestive system   . Osteoarthrosis, unspecified whether generalized or localized, unspecified site   . Unspecified essential hypertension     Past Surgical History  Procedure Date  . Shoulder surgery     right  . Pilonidal cyst / sinus excision     Family History  Problem Relation  Age of Onset  . Diabetes Mother   . Other Mother     cva  . Other Father     CVA   Social History:  reports that she quit smoking about 9 years ago. She has never used smokeless tobacco. She reports that she does not drink alcohol or use illicit drugs.  HSG. Married: 71- 8 yrs/ divorced; married 1960- 1988/ widowed. 4 grandchildren. 2 daughters- 15, 24; 2 sons- 66, 54. Lives alone- independent ADLs- pet cockatiel.      Allergies:  Allergies  Allergen Reactions  . Aspirin   . Metoclopramide Hcl     REACTION: Tremors  . Penicillins   . Tramadol Hcl          Review of Systems  Constitutional: Negative for fever, chills and weight loss.  HENT: Negative for hearing loss, nosebleeds, congestion and tinnitus.   Eyes: Negative.   Respiratory: Positive for shortness of breath. Negative for cough, sputum production and wheezing.   Cardiovascular: Positive for palpitations. Negative for chest pain, orthopnea and leg swelling.  Gastrointestinal: Negative for heartburn, nausea and abdominal pain.  Genitourinary: Negative.   Musculoskeletal: Positive for myalgias and joint pain.  Skin: Negative.   Neurological: Negative for dizziness, tingling, speech change, focal weakness, weakness and headaches.  Endo/Heme/Allergies: Negative.   Psychiatric/Behavioral: Positive for memory loss. The patient is nervous/anxious.      Physical Exam  T 97.9 124/62 HR 150  R 12 O2 sat 92% Gen'l - WNWD elderly white woman in no distress HEENT-  C&S clear, PERRLA, TMs normal, Throat w/o erythema or exudate Neck - supple, no thyromegaly Cor - 2+ radial pulse, no JVD, no carotid Bruit. IRIR with RVR to 150, no murmur Pulm - normal respirations, Lungs CTAP Abd- BS+, no tenderness Pelvic/Rectal deferred MSK - normal joints without redness, synovial thickening hands, normal ROM Derm - Clear Neuro - A&O x 3, CN II-XII normal, MS equal and normal for age, Cerebellar - no tremor, ambulates with a  walker Assessment/Plan 1. Cardiac - patient with newly detected a. Fib with RVR who is hemodynamically stable. She does live alone and has some memory issues making it unsafe to try to manage as an outpatient. She is taking Plavix for previous CVA  Plan Tele admit  Meds - cardiazem 10 mg IV bolus then 5 mg/hr   Continue home meds including metoprolol but stop Nifedipine   DC  plavix , heparin drip - need to consider Advancing to xeralto or other  Lab - CMet, TSH, cycle troponins, CBCD  Cardiology consult  2. GI - h/o GI bleed but she has been very stable and has continued on PPI  3. Psych - very anxious woman but has been doing well on prn xanax. Failed weaning trail to long acting SSRI due to fear of recurrent anxiety  4. Chronic pain - she has been on low dose q 3 hour hydrocodone for a long time and this seem to work for her, also uses tramadol as needed.  Studies - 2 D echo in AM  Michael Norins 03/13/2012, 7:03 PM

## 2012-03-14 DIAGNOSIS — I369 Nonrheumatic tricuspid valve disorder, unspecified: Secondary | ICD-10-CM

## 2012-03-14 LAB — TROPONIN I: Troponin I: 0.68 ng/mL (ref <0.30)

## 2012-03-14 MED ORDER — RIVAROXABAN 20 MG PO TABS
20.0000 mg | ORAL_TABLET | Freq: Every day | ORAL | Status: DC
  Filled 2012-03-14: qty 1

## 2012-03-14 MED ORDER — DILTIAZEM HCL ER COATED BEADS 180 MG PO CP24
180.0000 mg | ORAL_CAPSULE | Freq: Every day | ORAL | Status: DC
  Administered 2012-03-14 – 2012-03-15 (×2): 180 mg via ORAL
  Filled 2012-03-14 (×2): qty 1

## 2012-03-14 MED ORDER — DILTIAZEM HCL 100 MG IV SOLR: 10.0000 mg/h | INTRAVENOUS | Status: DC

## 2012-03-14 MED ORDER — HEPARIN (PORCINE) IN NACL 100-0.45 UNIT/ML-% IJ SOLN
1100.0000 [IU]/h | INTRAMUSCULAR | Status: AC
  Filled 2012-03-14: qty 250

## 2012-03-14 MED ORDER — RIVAROXABAN 15 MG PO TABS
15.0000 mg | ORAL_TABLET | Freq: Every day | ORAL | Status: DC
  Administered 2012-03-14 – 2012-03-15 (×2): 15 mg via ORAL
  Filled 2012-03-14 (×2): qty 1

## 2012-03-14 MED ORDER — RIVAROXABAN 20 MG PO TABS: 20.0000 mg | ORAL_TABLET | Freq: Every day | ORAL | Status: DC

## 2012-03-14 MED ORDER — ALPRAZOLAM 0.25 MG PO TABS
0.1250 mg | ORAL_TABLET | ORAL | Status: DC | PRN
  Administered 2012-03-14 – 2012-03-15 (×8): 0.125 mg via ORAL
  Filled 2012-03-14 (×8): qty 1

## 2012-03-14 MED ORDER — SIMETHICONE 80 MG PO CHEW
80.0000 mg | CHEWABLE_TABLET | Freq: Four times a day (QID) | ORAL | Status: DC | PRN
  Administered 2012-03-14: 80 mg via ORAL
  Filled 2012-03-14: qty 1

## 2012-03-14 NOTE — Progress Notes (Signed)
ANTICOAGULATION CONSULT NOTE - Follow Up Consult  Pharmacy Consult for heparin Indication: atrial fibrillation  Labs:  Basename 03/14/12 0250 03/13/12 2000 03/13/12 1935  HGB -- 15.7* --  HCT -- 45.8 --  PLT -- 245 --  APTT -- -- --  LABPROT -- -- --  INR -- -- --  HEPARINUNFRC 0.36 -- --  CREATININE -- 0.87 --  CKTOTAL -- -- --  CKMB -- -- --  TROPONINI -- -- 0.32*    Assessment: 78yo female therapeutic on heparin with initial dosing for Afib though level drawn just four hours after bolus so would expect true level to be lower.  Goal of Therapy:  Heparin level 0.3-0.7 units/ml   Plan:  Will increase heparin gtt slightly to 1100 units/hr and check level in 8hr.  Colleen Can PharmD BCPS 03/14/2012,3:42 AM

## 2012-03-14 NOTE — Progress Notes (Signed)
Subjective: Patient had a quiet night. No c/o chest pain. She has been able to get to bathroom without trouble, no chest pain. Objective: Lab: Lab Results  Component Value Date   WBC 8.1 03/13/2012   HGB 15.7* 03/13/2012   HCT 45.8 03/13/2012   MCV 96.6 03/13/2012   PLT 245 03/13/2012   BMET    Component Value Date/Time   NA 137 03/13/2012 2000   K 4.1 03/13/2012 2000   CL 98 03/13/2012 2000   CO2 26 03/13/2012 2000   GLUCOSE 117* 03/13/2012 2000   BUN 20 03/13/2012 2000   CREATININE 0.87 03/13/2012 2000   CALCIUM 10.4 03/13/2012 2000   GFRNONAA 61* 03/13/2012 2000   GFRAA 70* 03/13/2012 2000   Cardiac Panel (last 3 results)  Basename 03/14/12 0215 03/13/12 1935  CKTOTAL -- --  CKMB -- --  TROPONINI 0.68* 0.32*  RELINDX -- --     Imaging: IMPRESSION:  1. Stable cardiomegaly.  2. Large hiatal hernia.   Scheduled Meds:   . [COMPLETED] diltiazem  10 mg Intravenous Once  . [COMPLETED] heparin  2,000 Units Intravenous Once  . metoprolol tartrate  25 mg Oral Q6H  . pantoprazole  40 mg Oral Daily  . senna  1 tablet Oral BID  . sertraline  50 mg Oral Daily  . sodium chloride  3 mL Intravenous Q12H  . sodium chloride  3 mL Intravenous Q12H  . [DISCONTINUED] metoprolol  50 mg Oral Daily  . [DISCONTINUED] mirabegron ER  25 mg Oral Daily   Continuous Infusions:   . diltiazem (CARDIZEM) infusion    . heparin 1,100 Units/hr (03/14/12 0355)  . [DISCONTINUED] diltiazem (CARDIZEM) infusion 5 mg/hr (03/13/12 2047)   PRN Meds:.sodium chloride, ALPRAZolam, HYDROcodone-acetaminophen, ondansetron (ZOFRAN) IV, ondansetron, sodium chloride, traMADol, [DISCONTINUED] HYDROcodone-acetaminophen   Physical Exam: Filed Vitals:   03/14/12 0518  BP: 135/62  Pulse: 73  Temp: 98.6 F (37 C)  Resp: 18   Awake and alert Cor- irregular rate.  PUlm - normal respirations Neuro - normal    Assessment/Plan: 1. Cardiac - patient has converted to sinus rhythm with sinus  arrythmia. Heart rate this AM down to 70's. Troponin has bumped but no chest pain. 2 D echo pending. Increased troponin from strain. Most likely for outpatient myoview unless she develops chest pain. May need to bump back beta-blocker - depending on heart rate on oral diltiazem  Plan Start cardiazem CD 180 daily and stop IV diltiazem  Will start Xeralto 20 mg daily and stop heparin once it is clear if she will need any intervention  2-4 stable. Will allow her to take xanax and hydrocodone together   Dispo - hopefully home tomorrow.    Illene Regulus [place] [facility] (o) [phone]; (c) [phone] Call-grp - [facility] Tele: [phone]  03/14/2012, 7:04 AM

## 2012-03-14 NOTE — Progress Notes (Signed)
Pt troponin positive. Dr Shirlee Latch in room and made aware. Order to increase cardizem drip to 10mg /hr. Pt HR 100-110's. Pt has no complaints of pain. Will continue to monitor.

## 2012-03-14 NOTE — Progress Notes (Signed)
PROGRESS NOTE  Subjective:   Jacqueline [name] is an 78 yo admitted with rapid AF.  She has converted to NSR.  She has a hx of CVA and has been on coumadin in the past but was not able to tolerate it.   Troponin is mildly elevated.  She is a very anxious type of person.    She is feeling well.  No angina. Breathing is much better.   Objective:    Vital Signs:   Temp:  [97.6 F (36.4 C)-98.6 F (37 C)] 98.6 F (37 C) (11/13 0518) Pulse Rate:  [55-107] 73  (11/13 0518) Resp:  [18-20] 18  (11/13 0518) BP: (111-155)/(62-77) 135/62 mmHg (11/13 0518) SpO2:  [92 %-99 %] 99 % (11/13 0518) Weight:  [158 lb 3.2 oz (71.759 kg)-158 lb 12 oz (72.009 kg)] 158 lb 3.2 oz (71.759 kg) (11/13 0518)  Last BM Date: 03/13/12   24-hour weight change: Weight change:   Weight trends: Filed Weights   03/14/12 0518  Weight: 158 lb 3.2 oz (71.759 kg)    Intake/Output:        Physical Exam: BP 135/62  Pulse 73  Temp 98.6 F (37 C) (Oral)  Resp 18  Ht 5\' 2"  (1.575 m)  Wt 158 lb 3.2 oz (71.759 kg)  BMI 28.94 kg/m2  SpO2 99%  General: Vital signs reviewed and noted. Well-developed, well-nourished, in no acute distress; alert, appropriate and cooperative .  Head: Normocephalic, atraumatic.  Eyes: conjunctivae/corneas clear.  EOM's intact.   Throat: normal  Neck: Supple. Normal carotids. No JVD  Lungs:  Clear to auscultation  Heart: Regular rate,  With normal  S1 S2. No murmurs, gallops or rubs  Abdomen:  Soft, non-tender, non-distended with normoactive bowel sounds. No hepatomegaly. No rebound/guarding. No abdominal masses.  Extremities: Distal pedal pulses are 2+ .  No edema.    Neurologic: A&O X3, CN II - XII are grossly intact. Motor strength is 5/5 in the all 4 extremities.  Psych: Responds to questions appropriately with normal affect.    Labs: BMET:  Basename 03/13/12 2000  NA 137  K 4.1  CL 98  CO2 26  GLUCOSE 117*  BUN 20  CREATININE 0.87  CALCIUM 10.4  MG 2.3  PHOS --     Liver function tests:  Basename 03/13/12 2000  AST 30  ALT 16  ALKPHOS 72  BILITOT 0.7  PROT 7.2  ALBUMIN 4.3   No results found for this basename: LIPASE:2,AMYLASE:2 in the last 72 hours  CBC:  Basename 03/13/12 2000  WBC 8.1  NEUTROABS 5.2  HGB 15.7*  HCT 45.8  MCV 96.6  PLT 245    Cardiac Enzymes:  Basename 03/14/12 0215 03/13/12 1935  CKTOTAL -- --  CKMB -- --  TROPONINI 0.68* 0.32*     Basename 03/13/12 2000  TSH 3.243  T4TOTAL --  T3FREE --  THYROIDAB --   No results found for this basename: VITAMINB12,FOLATE,FERRITIN,TIBC,IRON,RETICCTPCT in the last 72 hours    Tele:  NSR  Medications:    Infusions:    . diltiazem (CARDIZEM) infusion    . heparin    . [DISCONTINUED] diltiazem (CARDIZEM) infusion 5 mg/hr (03/13/12 2047)  . [DISCONTINUED] heparin 1,100 Units/hr (03/14/12 0355)    Scheduled Medications:    . diltiazem  180 mg Oral Daily  . [COMPLETED] diltiazem  10 mg Intravenous Once  . [COMPLETED] heparin  2,000 Units Intravenous Once  . metoprolol tartrate  25 mg Oral Q6H  . pantoprazole  40 mg Oral Daily  . rivaroxaban  15 mg Oral Q supper  . senna  1 tablet Oral BID  . sodium chloride  3 mL Intravenous Q12H  . sodium chloride  3 mL Intravenous Q12H  . [DISCONTINUED] metoprolol  50 mg Oral Daily  . [DISCONTINUED] mirabegron ER  25 mg Oral Daily  . [DISCONTINUED] rivaroxaban  20 mg Oral Daily  . [DISCONTINUED] rivaroxaban  20 mg Oral Q supper  . [DISCONTINUED] sertraline  50 mg Oral Daily    Assessment/ Plan:    ANXIETY (01/23/2007)  HYPERTENSION (01/23/2007)  Atrial fibrillation with RVR (03/13/2012) She has converted to NSR.  She has had a CVA in the past and I agree with Dr. Shirlee Latch that she would benefit from Xarelto or Eliquis.  I would favor either of these over Plavix.  Will start Xarelto today.  I agree with PO diltiazem + PO metoprolol.  Can change to Metoprolol 50 BID.  Elevated Troponin: I think this is due to  demand ischemia and not an ACS.     Disposition: anticipate DC soon. Length of Stay: 1  Vesta Mixer, Montez Hageman., MD, [facility] 03/14/2012, 9:16 AM Office [phone] Pager [phone]

## 2012-03-14 NOTE — Plan of Care (Signed)
Problem: Phase I Progression Outcomes Goal: Anticoagulation Therapy per MD order Outcome: Completed/Met Date Met:  03/14/12 IV heparin

## 2012-03-14 NOTE — Care Management Note (Signed)
    Page 1 of 1   03/15/2012     10:21:02 AM   CARE MANAGEMENT NOTE 03/15/2012  Patient:  Jacqueline, [name]   Account Number:  [id number]  Date Initiated:  03/14/2012  Documentation initiated by:  GRAVES-BIGELOW,Mavis Fichera  Subjective/Objective Assessment:   Pt admitted with afib. Initiated on IV cardizem gtt and converted to NSR.     Action/Plan:   CM did a benefits check for xarelto and will make pt aware once completed. Will monitor for disposition needs.   Anticipated DC Date:  03/16/2012   Anticipated DC Plan:  HOME/SELF CARE      DC Planning Services  CM consult      Choice offered to / List presented to:             Status of service:  Completed, signed off Medicare Important Message given?   (If response is "NO", the following Medicare IM given date fields will be blank) Date Medicare IM given:   Date Additional Medicare IM given:    Discharge Disposition:  HOME/SELF CARE  Per UR Regulation:  Reviewed for med. necessity/level of care/duration of stay  If discussed at Long Length of Stay Meetings, dates discussed:    Comments:   03-15-12 [address], [place] [phone] CM did fax Rx to [facility] for 10 day supply- Pt to activate card for xarelto  and receive 10 day free.  03-14-12 1102  XARELTO 15 MG  DAILY TIER 3 ESTIMATED CO PAY $42.00 FOR A 30 DAY SUPPLY. CM will make pt aware of cost. Thanks. Gala Lewandowsky, RN,BSN [phone]. CM did provide pt a 10 day free card xarelto- MD please write Rx for 10 day free no refills and then the original Rx with refills. Thanks Tomi Bamberger, RN,BSN [phone]

## 2012-03-14 NOTE — Progress Notes (Signed)
  Echocardiogram 2D Echocardiogram has been performed.  Farrel Demark, RDMS, RVT 03/14/2012, 3:35 PM

## 2012-03-14 NOTE — Progress Notes (Signed)
CRITICAL VALUE ALERT  Critical value received:  Troponin 0.32  Date of notification:  03/13/12  Time of notification:  2120  Critical value read back:yes  Nurse who received alert:  Dorna Leitz  MD notified (1st page):  [place] in patients room when lab called with lab value  Time of first page:  Not paged MD in room  MD notified (2nd page):  Time of second page:  Responding MD:  Dr. Shirlee Latch  Time MD responded:  2128

## 2012-03-14 NOTE — Progress Notes (Signed)
UR Completed Sachi Boulay Graves-Bigelow, RN,BSN [phone]  

## 2012-03-15 ENCOUNTER — Telehealth: Payer: Self-pay | Admitting: Internal Medicine

## 2012-03-15 MED ORDER — RIVAROXABAN 15 MG PO TABS: 15.0000 mg | ORAL_TABLET | Freq: Every day | ORAL | Status: DC

## 2012-03-15 MED ORDER — DILTIAZEM HCL ER COATED BEADS 180 MG PO CP24: 180.0000 mg | ORAL_CAPSULE | Freq: Every day | ORAL | Status: DC

## 2012-03-15 MED ORDER — METOPROLOL TARTRATE 50 MG PO TABS: 50.0000 mg | ORAL_TABLET | Freq: Every day | ORAL | Status: DC

## 2012-03-15 MED ORDER — METOPROLOL TARTRATE 50 MG PO TABS: 50.0000 mg | ORAL_TABLET | Freq: Two times a day (BID) | ORAL | Status: DC

## 2012-03-15 NOTE — Telephone Encounter (Signed)
Thanks. She hasn't left the hospital yet.

## 2012-03-15 NOTE — Progress Notes (Signed)
Pt and pt's daughter Sheralyn Boatman) have been educated and informed of D/C information and medication changes. Pt and daughter have no further questions or concerns. Pt prepared for D/C home.

## 2012-03-15 NOTE — Progress Notes (Signed)
Patient ID: Jacqueline [name], female   DOB: 1928-11-29, 78 y.o.   MRN: [id number]    SUBJECTIVE: Doing well this morning.  She is remains in NSR.  EF normal on echo yesterday.      . diltiazem  180 mg Oral Daily  . metoprolol tartrate  25 mg Oral Q6H  . pantoprazole  40 mg Oral Daily  . rivaroxaban  15 mg Oral Q supper  . senna  1 tablet Oral BID  . sodium chloride  3 mL Intravenous Q12H  . sodium chloride  3 mL Intravenous Q12H      Filed Vitals:   03/14/12 1803 03/14/12 2100 03/15/12 0144 03/15/12 0500  BP: 130/65 143/81 173/80 155/91  Pulse: 73 58 78 83  Temp:  98.3 F (36.8 C)  98.3 F (36.8 C)  TempSrc:      Resp:  20  16  Height:      Weight:    160 lb 14.4 oz (72.984 kg)  SpO2:  97%  98%    Intake/Output Summary (Last 24 hours) at 03/15/12 0849 Last data filed at 03/14/12 2241  Gross per 24 hour  Intake      3 ml  Output      0 ml  Net      3 ml    LABS: Basic Metabolic Panel:  Basename 03/13/12 2000  NA 137  K 4.1  CL 98  CO2 26  GLUCOSE 117*  BUN 20  CREATININE 0.87  CALCIUM 10.4  MG 2.3  PHOS --   Liver Function Tests:  Basename 03/13/12 2000  AST 30  ALT 16  ALKPHOS 72  BILITOT 0.7  PROT 7.2  ALBUMIN 4.3   No results found for this basename: LIPASE:2,AMYLASE:2 in the last 72 hours CBC:  Basename 03/13/12 2000  WBC 8.1  NEUTROABS 5.2  HGB 15.7*  HCT 45.8  MCV 96.6  PLT 245   Cardiac Enzymes:  Basename 03/14/12 0800 03/14/12 0215 03/13/12 1935  CKTOTAL -- -- --  CKMB -- -- --  CKMBINDEX -- -- --  TROPONINI 0.66* 0.68* 0.32*   BNP: No components found with this basename: POCBNP:3 D-Dimer: No results found for this basename: DDIMER:2 in the last 72 hours Hemoglobin A1C: No results found for this basename: HGBA1C in the last 72 hours Fasting Lipid Panel: No results found for this basename: CHOL,HDL,LDLCALC,TRIG,CHOLHDL,LDLDIRECT in the last 72 hours Thyroid Function Tests:  Basename 03/13/12 2000  TSH 3.243  T4TOTAL --    T3FREE --  THYROIDAB --   Anemia Panel: No results found for this basename: VITAMINB12,FOLATE,FERRITIN,TIBC,IRON,RETICCTPCT in the last 72 hours  RADIOLOGY: Portable Chest 1 View  03/13/2012  *RADIOLOGY REPORT*  Clinical Data: Atrial fibrillation, rapid heart rate  PORTABLE CHEST - 1 VIEW  Comparison: Portable chest x-ray of 11/09/2008  Findings: There is mild basilar atelectasis present.  Cardiomegaly is stable.  A large hiatal hernia again is noted.  The bones are osteopenic and there are degenerative changes in both shoulders.  IMPRESSION:  1.  Stable cardiomegaly. 2.  Large hiatal hernia.   Original Report Authenticated By: Dwyane Dee, M.D.     PHYSICAL EXAM General: NAD Neck: No JVD, no thyromegaly or thyroid nodule.  Lungs: Clear to auscultation bilaterally with normal respiratory effort. CV: Nondisplaced PMI.  Heart regular S1/S2, no S3/S4, no murmur.  No peripheral edema.  No carotid bruit.    Abdomen: Soft, nontender, no hepatosplenomegaly, no distention.  Neurologic: Alert and oriented x 3.  Psych:  Normal affect. Extremities: No clubbing or cyanosis.   TELEMETRY: Reviewed telemetry pt in NSR  ASSESSMENT AND PLAN: 78 yo with history of CVA presented with atrial fibrillation/RVR, now back in NSR.  Echo showed normal EF, mild MR.   1. Atrial fibrillation: Agree with Xarelto and diltiazem CD going home.  Can stop metoprolol. 2. Elevated cardiac enzymes: No chest pain but mild elevation in troponin.  Suspect demand ischemia with atrial fibrillation/RVR.  Will arrange for outpatient myoview.   [name] 03/15/2012 8:51 AM

## 2012-03-15 NOTE — Telephone Encounter (Signed)
Called pt, lvmom.  Will try calling back if call is not returned.

## 2012-03-15 NOTE — Telephone Encounter (Signed)
Message copied by Newell Coral on Thu Mar 15, 2012  9:19 AM ------      Message from: Illene Regulus E      Created: Thu Mar 15, 2012  6:56 AM       Hospital follow-up Monday, Nov 18th.      Thanks

## 2012-03-15 NOTE — Discharge Summary (Signed)
[name], [name] NO.:  [id number]  MEDICAL RECORD NO.:  [id number]  LOCATION:  3W19C                        FACILITY:  [facility]  PHYSICIAN:  Jacqueline Gess. Norins, MD  DATE OF BIRTH:  [birth year]  DATE OF ADMISSION:  03/13/2012 DATE OF DISCHARGE:  03/15/2012                              DISCHARGE SUMMARY   ADMITTING DIAGNOSIS:  Atrial fibrillation with rapid ventricular response, new onset.  DISCHARGE DIAGNOSIS:  Atrial fibrillation with rapid ventricular response.  CONSULTANTS:  Dr. Marca [name] for Cardiology.  PROCEDURES: 1. Chest x-ray on the day of admission, which revealed a stable     cardiomegaly.  Large hiatal hernia. 2. A 2D echo performed on March 14, 2012 which revealed mild LVH.     Severe focal basal hypertrophy.  Normal systolic function.  EF     estimated at 60-65%.  Wall motion was normal with no regional wall     motion abnormalities.  Doppler parameters consistent with grade 1     diastolic dysfunction as well as high ventricular filling pressure.  HISTORY OF PRESENT ILLNESS:  Jacqueline [name] is an 78 year old woman with a previous history of stroke and hypertension, who is at her orthopedist's office on the day of admission and was noted to have a rapid irregular heart rate.  She was seen in the office and was found on EKG to have atrial fibrillation with a heart rate of 151.  The patient was hemodynamically stable but did admit to increased shortness of breath. She had had intermittent episodes where she would feel mildly short of breath, slightly diaphoretic, and had decreased energy.  She denied having any chest pain per se and no really significant decrease in exercise tolerance.  She was subsequently admitted for treatment of her new onset atrial fibrillation with rapid ventricular response.  Please see the H and P for past medical history, family history, social history, and admission exam.  HOSPITAL COURSE:  The patient was  admitted to telemetry unit.  She was started on diltiazem IV with 10 mg bolus and then 5 mg an hour.  She was subsequently increased to 10 mg an hour for better heart rate control. The patient was seen in consultation by Dr. Shirlee Latch from Cardiology Service.  The patient was already on metoprolol at time of admission and this was increased for better rate control.  The patient did well.  She spontaneously converted to a normal sinus rhythm the first hospital day.  The patient was converted to oral diltiazem 180 mg once daily and was taken off of IV drip.  The patient was continued on increased dose of Toprol and tolerated this well.  The patient did have a 2D echo with results as noted.  The patient was switched from Plavix to Xarelto for better prevention of stroke in the setting of atrial fibrillation with a history of stroke giving her a very high [place] score.  The patient did tolerate this medication change as well.  The patient continued to hold sinus rhythm.  She remained asymptomatic. With all appropriate studies being completed with the patient having converted to a sinus rhythm with her on a new medical  regimen for rate control as well as stroke prevention, she is now stable and ready for discharge to home.  DISCHARGE EXAMINATION:  VITAL SIGNS:  Temperature was 98.3, blood pressure 155/91, heart rate 83, respirations 16, oxygen saturation was 98%.  Telemetry monitoring revealed the patient had held sinus rhythm. GENERAL APPEARANCE:  This is a well-nourished, well-developed, mildly overweight Caucasian woman in no acute distress. HEENT:  Conjunctivae and sclerae were clear.  Pupils equal, round, and reactive.  PULMONARY:  The patient had good breath sounds with no rales, wheezes, rhonchi, no increased work of breathing. CARDIOVASCULAR:  2+ radial pulse.  She had quiet precordium.  Her heart rate was regular. NEUROLOGIC:  The patient is awake, alert.  She is oriented to  person, place, time and context and knows the examiner.  She had no focal deficits.  FINAL LABORATORY:  At admission the patient had a normal basic metabolic panel except for a glucose of 117.  Liver functions were normal. Troponins were mildly elevated going from 0.32 to 0.68 to 0.66.  CBC was unremarkable with a hemoglobin of 15.7 g, white count was 8100, platelet count was 245,000.  TSH was normal at 3.243.  Imaging studies as above.  DISCHARGE MEDICATIONS:  The patient will resume all of her home medications except she will discontinue Plavix, she will discontinue nifedipine.  She will take Diltia XT 180 mg daily.  She will take Xarelto on a daily basis.  The patient's metoprolol will be continued at 50 mg once a day.  DISPOSITION:  The patient will be discharged to home.  She will be seen by Internal Medicine for followup on Monday, the 18th.  Arrangements will be made at that time for her to have outpatient Myoview study to rule out any coronary artery disease as contributing factor.  The patient's condition at time of discharge dictation is stable and improved.     Jacqueline Gess Norins, MD     MEN/MEDQ  D:  03/15/2012  T:  03/15/2012  Job:  [id number]  cc:   Jacqueline Ancona, MD

## 2012-03-15 NOTE — Progress Notes (Signed)
Stable heart rate - converted to sinus rhythm. Tolerating Diltiazem and Xeralton.  For d/c home. Follow up Monday 11/1/8 - will arrange out pt [facility]  Dictate # [id number]

## 2012-03-16 ENCOUNTER — Telehealth: Payer: Self-pay | Admitting: *Deleted

## 2012-03-16 MED ORDER — DILTIAZEM HCL ER 180 MG PO CP24: 180.0000 mg | ORAL_CAPSULE | Freq: Every day | ORAL | Status: DC

## 2012-03-16 NOTE — Telephone Encounter (Signed)
Sent Rx to [facility]

## 2012-03-16 NOTE — Telephone Encounter (Signed)
Pt called stating that she will not have any Cardizem to take until her prescription comes in from [facility]. Please advise.

## 2012-03-16 NOTE — Telephone Encounter (Signed)
Called pt to let her know that her Rx was sent to [facility].

## 2012-03-19 ENCOUNTER — Telehealth: Payer: Self-pay | Admitting: Internal Medicine

## 2012-03-19 NOTE — Telephone Encounter (Signed)
Please read note below

## 2012-03-19 NOTE — Telephone Encounter (Signed)
1. She tolerated Nifedipine for years which is in the same class as dilitazem 2. Uncontrolled atrial fibrillation is a MUCH bigger problem and threat to her health than Potential side effects. 3. Keep hospital follow up appointment 11/21

## 2012-03-19 NOTE — Telephone Encounter (Signed)
Patient Information:  Caller Name: Arlenis  Phone: [phone]  Patient: Jacqueline [name], Jacqueline [name]  Gender: Female  DOB: [birth year]  Age: 78 Years  PCP: Illene Regulus (Adults only)   Symptoms  Reason For Call & Symptoms: continues to have pedal edema and it seems to be worsening  Reviewed Health History In EMR: Yes  Reviewed Medications In EMR: Yes  Reviewed Allergies In EMR: Yes  Date of Onset of Symptoms: 03/15/2012  Guideline(s) Used:  Leg Swelling and Edema  Disposition Per Guideline:   See Within 3 Days in Office  Reason For Disposition Reached:   Mild swelling of both ankles (i.e., pedal edema) AND new onset or worsening  Advice Given:  Expected Course:  If your leg swelling does not get better during the next week or if it recurs, make an appointment with your doctor.  Office Follow Up:  Does the office need to follow up with this patient?: No  Instructions For The Office: N/A  Appointment Scheduled:  03/22/2012 14:00:00  RN Note:  Patient very concerned about the Diltiazem that was started, she has read all of the phamplets given and concerned about baldness also. Has an appt. already scheduled on 11/21 at 2p.  To call back if any sx worsens.  Her wt. today was the same.

## 2012-03-20 ENCOUNTER — Telehealth: Payer: Self-pay | Admitting: *Deleted

## 2012-03-20 NOTE — Telephone Encounter (Signed)
Pt states her ankles are extremely swollen. The swelling began when she left the hospital but has gradually gotten worse. Today she has some swelling in her hands. Pt states she has weighed herself and there is no weight gain. She does remember her follow up appt on 11/21. Please advise.

## 2012-03-20 NOTE — Telephone Encounter (Signed)
1. Please continue cardiazem CD - need to control A. Fib 2. May start lasix 20 mg once a day to help with the swelling 3. Keep appointment 11/21

## 2012-03-21 ENCOUNTER — Ambulatory Visit: Payer: BC Managed Care – PPO | Admitting: Internal Medicine

## 2012-03-21 NOTE — Telephone Encounter (Signed)
Called pt and told her to continue with her cardiazem cd, Dr. Debby Bud may start lasix for swelling, will see her at her f/up appt tomorrow. Pt understood.

## 2012-03-22 ENCOUNTER — Encounter (HOSPITAL_COMMUNITY): Payer: BC Managed Care – PPO

## 2012-03-22 ENCOUNTER — Ambulatory Visit (INDEPENDENT_AMBULATORY_CARE_PROVIDER_SITE_OTHER): Payer: Medicare Other | Admitting: Internal Medicine

## 2012-03-22 ENCOUNTER — Encounter: Payer: Self-pay | Admitting: Internal Medicine

## 2012-03-22 VITALS — BP 130/78 | HR 87 | Temp 98.0°F | Resp 12 | Wt 163.1 lb

## 2012-03-22 DIAGNOSIS — I4891 Unspecified atrial fibrillation: Secondary | ICD-10-CM

## 2012-03-22 MED ORDER — RIVAROXABAN 20 MG PO TABS: 20.0000 mg | ORAL_TABLET | Freq: Every day | ORAL | Status: DC

## 2012-03-22 MED ORDER — FUROSEMIDE 20 MG PO TABS: 20.0000 mg | ORAL_TABLET | Freq: Every day | ORAL | Status: DC

## 2012-03-22 NOTE — Patient Instructions (Addendum)
Jacqueline [name] is a very pleasant 78 yo female who presents for hospital follow-up. She was admitted to the hospital on 03/13/12 with new-onset atrial fibrillation with rapid ventricular response (RVR).  Atrial Fibrillation with RVR  1. Continue diltiazem 180 mg daily and Xarelto 15 mg daily  2. Will start Lasix 20 mg to help with leg swelling. Will also help with blood pressure.  3. Patient will follow up with Dr. Shirlee Latch on 04/04/12 with a stress test to follow   Below is some information on atrial fibrillation if you would like it:  Atrial Fibrillation Your caregiver has diagnosed you with atrial fibrillation (AFib). The heart normally beats very regularly; AFib is a type of irregular heartbeat. The heart rate may be faster or slower than normal. This can prevent your heart from pumping as well as it should. AFib can be constant (chronic) or intermittent (paroxysmal). CAUSES   Atrial fibrillation may be caused by:  Heart disease, including heart attack, coronary artery disease, heart failure, diseases of the heart valves, and others.   Blood clot in the lungs (pulmonary embolism).   Pneumonia or other infections.   Chronic lung disease.   Thyroid disease.   Toxins. These include alcohol, some medications (such as decongestant medications or diet pills), and caffeine.  In some people, no cause for AFib can be found. This is referred to as Lone Atrial Fibrillation. SYMPTOMS    Palpitations or a fluttering in your chest.   A vague sense of chest discomfort.   Shortness of breath.   Sudden onset of lightheadedness or weakness.  Sometimes, the first sign of AFib can be a complication of the condition. This could be a stroke or heart failure. DIAGNOSIS   Your description of your condition may make your caregiver suspicious of atrial fibrillation. Your caregiver will examine your pulse to determine if fibrillation is present. An EKG (electrocardiogram) will confirm the diagnosis.  Further testing may help determine what caused you to have atrial fibrillation. This may include chest x-ray, echocardiogram, blood tests, or CT scans. PREVENTION   If you have previously had atrial fibrillation, your caregiver may advise you to avoid substances known to cause the condition (such as stimulant medications, and possibly caffeine or alcohol). You may be advised to use medications to prevent recurrence. Proper treatment of any underlying condition is important to help prevent recurrence. PROGNOSIS   Atrial fibrillation does tend to become a chronic condition over time. It can cause significant complications (see below). Atrial fibrillation is not usually immediately life-threatening, but it can shorten your life expectancy. This seems to be worse in women. If you have lone atrial fibrillation and are under 37 years old, the risk of complications is very low, and life expectancy is not shortened. RISKS AND COMPLICATIONS   Complications of atrial fibrillation can include stroke, chest pain, and heart failure. Your caregiver will recommend treatments for the atrial fibrillation, as well as for any underlying conditions, to help minimize risk of complications. TREATMENT   Treatment for AFib is divided into several categories:  Treatment of any underlying condition.   Converting you out of AFib into a regular (sinus) rhythm.   Controlling rapid heart rate.   Prevention of blood clots and stroke.  Medications and procedures are available to convert your atrial fibrillation to sinus rhythm. However, recent studies have shown that this may not offer you any advantage, and cardiac experts are continuing research and debate on this topic. More important is controlling your rapid heartbeat.  The rapid heartbeat causes more symptoms, and places strain on your heart. Your caregiver will advise you on the use of medications that can control your heart rate. Atrial fibrillation is a strong stroke  risk. You can lessen this risk by taking blood thinning medications such as Coumadin (warfarin), or sometimes aspirin. These medications need close monitoring by your caregiver. Over-medication can cause bleeding. Too little medication may not protect against stroke. HOME CARE INSTRUCTIONS    If your caregiver prescribed medicine to make your heartbeat more normally, take as directed.   If blood thinners were prescribed by your caregiver, take EXACTLY as directed.   Perform blood tests EXACTLY as directed.   Quit smoking. Smoking increases your cardiac and lung (pulmonary) risks.   DO NOT drink alcohol.   DO NOT drink caffeinated drinks (e.g. coffee, soda, chocolate, and leaf teas). You may drink decaffeinated coffee, soda or tea.   If you are overweight, you should choose a reduced calorie diet to lose weight. Please see a registered dietitian if you need more information about healthy weight loss. DO NOT USE DIET PILLS as they may aggravate heart problems.   If you have other heart problems that are causing AFib, you may need to eat a low salt, fat, and cholesterol diet. Your caregiver will tell you if this is necessary.   Exercise every day to improve your physical fitness. Stay active unless advised otherwise.   If your caregiver has given you a follow-up appointment, it is very important to keep that appointment. Not keeping the appointment could result in heart failure or stroke. If there is any problem keeping the appointment, you must call back to this facility for assistance.  SEEK MEDICAL CARE IF:  You notice a change in the rate, rhythm or strength of your heartbeat.   You develop an infection or any other change in your overall health status.  SEEK IMMEDIATE MEDICAL CARE IF:    You develop chest pain, abdominal pain, sweating, weakness or feel sick to your stomach (nausea).   You develop shortness of breath.   You develop swollen feet and ankles.   You develop  dizziness, numbness, or weakness of your face or limbs, or any change in vision or speech.  MAKE SURE YOU:    Understand these instructions.   Will watch your condition.   Will get help right away if you are not doing well or get worse.  Document Released: 04/18/2005 Document Revised: 07/11/2011 Document Reviewed: 11/21/2007 [facility] Patient Information 2013 [place], [place].

## 2012-03-22 NOTE — Progress Notes (Signed)
Patient ID: Jacqueline [name], female   DOB: Jan 23, 1929, 78 y.o.   MRN: [id number]  HPI Jacqueline [name] is a very pleasant 78 yo female who presents for hospital follow-up. She was admitted to the hospital on 03/13/12 with new onset atrial fibrillation with RVR. She was started on IV heparin. Her hemoglobin and platelets were stable at admission. She has no signs or symptoms of bleeding complications. She was discharged on two new medications, diltiazem and Xarelto. She has not noted any fast or irregular heart rate since hospitalization.   Med changes -STOPPED nifedipine 30 mg daily -STARTED diltiazem 180 mg daily -STARTED Xarelto 15 mg daily  Since discharge, Jacqueline [name] notes swelling of her lower extremities. It is worse on the right and is new since starting the two new medications. She is wearing a soft knee brace on the right knee.  She is wondering about her Gas-X dosing (soft gels) because she now has stomach aches. She would like to take 125 mg at lunch and 180 mg at dinner like she has in the past. She wonders if it interacts with Xarelto.  She also wonders if she can continue to take her Vit D and natural vegetable laxative with the Xarelto.  She will see Dr. [name], cardiologist, on December 4. There is an exercise stress test scheduled for December 5 which the patient is apprehensive about because she has never exercised or been on a treadmill. A stress test was recommended by the cardiology team in the hospital. This test will be discussed further with Dr. Shirlee Latch on December 4.   ROS -Denies fever, chills, chest pain, cough, congestion, sore throat +Muscle aches (baseline - missed shots in shoulder because of hospitalization from Dr. Malon Kindle) +Laryngitis off and on for last month (voice change)   PE General: Pleasant, chatty lady in NAD CV: Regular rate and rhythm, S1 and S2 present Pulm: Lungs clear to auscultation bilaterally Extremities: mild bilateral lower leg and ankle  swelling, slightly more pronounced on right. Soft knee brace in place on right knee. Neuro: Alert and appropriate. No focal deficits.   Past Medical History  Diagnosis Date  . Allergy, unspecified not elsewhere classified   . Other dyspnea and respiratory abnormality   . Other diseases of lung, not elsewhere classified   . Anemia, unspecified   . Unspecified adverse effect of other drug, medicinal and biological substance(995.29)   . Blood in stool   . Hemorrhage of gastrointestinal tract, unspecified   . Hemorrhage of gastrointestinal tract, unspecified   . Personal history of peptic ulcer disease   . Anxiety state, unspecified   . Personal history of other diseases of digestive system   . Unspecified essential hypertension   . Complication of anesthesia     "hard to wake me up after colonoscopy" (03/13/12)  . H/O hiatal hernia   . GERD (gastroesophageal reflux disease)   . Unspecified paranoid state   . Atrial fibrillation with rapid ventricular response 03/13/12  . Osteoarthrosis, unspecified whether generalized or localized, lower leg   . Degeneration of lumbar or lumbosacral intervertebral disc   . Degeneration of lumbar or lumbosacral intervertebral disc   . Unspecified cerebral artery occlusion with cerebral infarction 10/2008    "mini stroke" denies residual (03/13/12)   Past Surgical History  Procedure Date  . Pilonidal cyst / sinus excision 1957  . Dilation and curettage of uterus 1950's?  . Shoulder arthroscopy ~ 2000    right   Family History  Problem  Relation Age of Onset  . Diabetes Mother   . Other Mother     cva  . Other Father     CVA   History   Social History  . Marital Status: Widowed    Spouse Name: N/A    Number of Children: 4  . Years of Education: 12   Occupational History  . retired    Social History Main Topics  . Smoking status: Former Smoker -- 1.0 packs/day for 40 years    Types: Cigarettes    Quit date: 05/02/2002  . Smokeless  tobacco: Never Used  . Alcohol Use: No  . Drug Use: No  . Sexually Active: No   Other Topics Concern  . Not on file   Social History Narrative   HSG. Married: 28- 8 yrs/ divorced; married 1960- 1988/ widowed. 4 grandchildren. 2 daughters- 81, 38; 2 sons- 35, 23. Lives alone- independent ADLs- pet cockatiel.   Current Outpatient Prescriptions on File Prior to Visit  Medication Sig Dispense Refill  . ALPRAZolam (XANAX) 0.25 MG tablet Take 0.5 mg by mouth daily. As directed. Up to 8 daily as needed for anxiety      . diltiazem (CARDIZEM CD) 180 MG 24 hr capsule Take 1 capsule (180 mg total) by mouth daily.  30 capsule  11  . diltiazem (DILACOR XR) 180 MG 24 hr capsule Take 1 capsule (180 mg total) by mouth daily.  30 capsule  0  . HYDROcodone-acetaminophen (VICODIN ES) 7.5-750 MG per tablet Take 0.5 tablets by mouth every 3 (three) hours as needed for pain. MAX DOSE is eight - half tabs daily  360 tablet  1  . ipratropium (ATROVENT) 0.06 % nasal spray Place 2 sprays into the nose 3 (three) times daily.  45 mL  1  . metoprolol (LOPRESSOR) 50 MG tablet Take 1 tablet (50 mg total) by mouth daily.  90 tablet  3  . Multiple Vitamins-Minerals (CENTRUM SILVER PO) Take 1 tablet by mouth daily.      . pantoprazole (PROTONIX) 40 MG tablet TAKE 1 TABLET EVERY MORNING  90 tablet  2  . Rivaroxaban (XARELTO) 15 MG TABS tablet Take 1 tablet (15 mg total) by mouth daily with supper.  30 tablet  11  . Simethicone (GAS-X EXTRA STRENGTH) 125 MG CAPS Take 1 capsule by mouth daily.      . traMADol (ULTRAM) 50 MG tablet Take 50 mg by mouth every 6 (six) hours as needed. For pain       A/P Jacqueline [name] is a very pleasant 78 yo female who presents for hospital follow-up. She was admitted to the hospital on 03/13/12 with new-onset atrial fibrillation with rapid ventricular response (RVR).  # Atrial Fibrillation with RVR -Continue diltiazem 180 mg daily and Xarelto 15 mg daily -Of note, the optimal dose of  Xarelto for atrial fibrillation is 20 mg, so patient has been written a prescription to increase to 20 mg once current supply (10 day supply) runs out -Will start Lasix 20 mg to help with leg swelling. Will also help with blood pressure. -Patient will follow up with Dr. Shirlee Latch on 04/04/12 with a stress test to follow   Patient interviewed and examined. Hospital records reviewed. Agree with assessment and plan per Ms. Chelsie Burel, MS III   M.Norins MD

## 2012-03-23 ENCOUNTER — Telehealth: Payer: Self-pay | Admitting: Cardiology

## 2012-03-23 NOTE — Telephone Encounter (Signed)
Pt has an instruction sheet from the hospital that told her to hold caffeine for 24 hours prior, explained procedure and I will mail her a copy of the myoview instructions, pt accepting of plan.

## 2012-03-23 NOTE — Telephone Encounter (Signed)
Pt calling re stress test, was told no caffine the day before, she says she has bad migraines and takes med twice a day but without her cup of coffee she will get a migriane, then she can't come in for test, pls advise

## 2012-03-25 NOTE — Assessment & Plan Note (Signed)
#   Atrial Fibrillation with RVR-ne onset Nov '13 presenting with HR 151.  Plan: -Continue diltiazem 180 mg daily and Xarelto 20 mg daily (Of note, the optimal dose of Xarelto for atrial fibrillation is 20 mg, patient has been written a prescription for 20 mg/d  once current supply (10 day supply) runs out) -Will start Lasix 20 mg to help with leg swelling. Will also help with blood pressure. -Patient will follow up with Dr. Shirlee Latch on 04/04/12 with a stress test to follow

## 2012-04-03 ENCOUNTER — Telehealth: Payer: Self-pay | Admitting: Internal Medicine

## 2012-04-03 NOTE — Telephone Encounter (Signed)
The best medical advice is that she needs to have the test. Let cardiology know she is cancelling and see if they can reschedule her for an afternoon appointment. Cardiology should also call her and go over the instructions so that she understands. thanks

## 2012-04-03 NOTE — Telephone Encounter (Signed)
The patient called and is hoping she can "get out of" doing her stress test.  She states she is unable to do the procedure in the morning and voiced concerns about the instructions.  She is hoping Dr.Norins and the cardiologist can continue treating her without doing this test, or prescribe a different test that can be done in the afternoon.

## 2012-04-04 ENCOUNTER — Ambulatory Visit (INDEPENDENT_AMBULATORY_CARE_PROVIDER_SITE_OTHER): Payer: Medicare Other | Admitting: Cardiology

## 2012-04-04 ENCOUNTER — Telehealth: Payer: Self-pay

## 2012-04-04 ENCOUNTER — Encounter: Payer: BC Managed Care – PPO | Admitting: Cardiology

## 2012-04-04 ENCOUNTER — Other Ambulatory Visit: Payer: Self-pay

## 2012-04-04 VITALS — BP 150/90 | HR 130 | Wt 159.0 lb

## 2012-04-04 DIAGNOSIS — R0602 Shortness of breath: Secondary | ICD-10-CM | POA: Diagnosis not present

## 2012-04-04 DIAGNOSIS — I4891 Unspecified atrial fibrillation: Secondary | ICD-10-CM

## 2012-04-04 DIAGNOSIS — I25810 Atherosclerosis of coronary artery bypass graft(s) without angina pectoris: Secondary | ICD-10-CM | POA: Diagnosis not present

## 2012-04-04 DIAGNOSIS — I10 Essential (primary) hypertension: Secondary | ICD-10-CM | POA: Diagnosis not present

## 2012-04-04 LAB — BASIC METABOLIC PANEL
BUN: 15 mg/dL (ref 6–23)
CO2: 30 mEq/L (ref 19–32)
Calcium: 9.8 mg/dL (ref 8.4–10.5)
GFR: 74.1 mL/min (ref >60.00)
Glucose, Bld: 94 mg/dL (ref 70–99)

## 2012-04-04 MED ORDER — METOPROLOL TARTRATE 50 MG PO TABS: 50.0000 mg | ORAL_TABLET | Freq: Two times a day (BID) | ORAL | Status: DC

## 2012-04-04 MED ORDER — POTASSIUM CHLORIDE CRYS ER 20 MEQ PO TBCR: 20.0000 meq | EXTENDED_RELEASE_TABLET | Freq: Every day | ORAL | Status: DC

## 2012-04-04 MED ORDER — DILTIAZEM HCL ER 180 MG PO CP24: 180.0000 mg | ORAL_CAPSULE | Freq: Two times a day (BID) | ORAL | Status: DC

## 2012-04-04 NOTE — Progress Notes (Signed)
Per pt. request, 30 tablets of Potassium 20 meq. daily ordered through [facility]. to see if pt. can tolerate. Pt. states she will call after taking for 2 weeks to request refill through [facility].

## 2012-04-04 NOTE — Telephone Encounter (Signed)
Per pt. request, 30 tablets of Potassium 20 meq. daily ordered through [facility] ph[facility]. to see if pt. can tolerate. Pt. states she will call after taking for 2 weeks to request refill through [facility].  

## 2012-04-04 NOTE — Progress Notes (Signed)
Patient ID: Jacqueline [name], female   DOB: [birth year], 78 y.o.   MRN: [id number] PCP: Dr. Debby Bud  78 yo with history of CVA, chronic diastolic CHF, and atrial fibrillation with RVR presents for cardiology followup after recent hospitalization.  Patient was admitted in 11/13 with atrial fibrillation with RVR to the 150s.  She was lightheaded and felt palpitations. In the hospital, she was noted to have a mildly elevated troponin level.  Echo showed normal EF with focal basal septal hypertrophy.  She went back into NSR overnight.  She was started on diltiazem CD and Xarelto and sent home.  She was subsequently started on a low dose of Lasix by   Today, she is having runs of tachycardia interspersed with NSR.  This appears to be atrial tachycardia.  She does not feel palpitations.    She is very limited at baseline by shoulder and knee OA.  She is relatively inactive and uses a walker to get around the house.  She denies chest pain or exertional dyspnea.  ECG: NSR with runs of probably atrial tachycardia  Labs (11/13): K 4.1, creatinine 0.87, TSH normal, TnI max 0.66  PMH: 1. CVA: Occipital posterior parietal region involvement on MRI. Carotid dopplers (7/10) with no significant stenosis. The CVA was thought to be embolic and she was on coumadin for a period of time afterwards. She stopped coumadin due to easy bleeding and general intolerance. Now has restructe 2. Diastolic CHF: Echo (11/13) with EF 60-65%, focal basal septal hypertrophy, mild MR< PA systolic pressure 39 mmHg. 3. Allergic rhinitis  4. PUD: Gastric ulcer years ago while on Celebrex. On PPI now.  5. OA  6. Atrial fibrillation: Diagnosis 03/13/12.  Admitted with RVR and mildly elevated troponin.  Went back into NSR on her own.   7. Hiatral hernia  SH: Lives alone in [place].  Daughter lives in town.  Quit smoking 2007, before that 1 ppd x 40 years.   FH: Father died at 53 w cerebral hemorrhage.   ROS: All systems reviewed and negative  except as per HPI.   Current Outpatient Prescriptions  Medication Sig Dispense Refill  . ALPRAZolam (XANAX) 0.25 MG tablet Take 0.5 mg by mouth daily. As directed. Up to 8 daily as needed for anxiety      . diltiazem (DILACOR XR) 180 MG 24 hr capsule Take 1 capsule (180 mg total) by mouth 2 (two) times daily.  180 capsule  1  . furosemide (LASIX) 20 MG tablet Take 1 tablet (20 mg total) by mouth daily.  30 tablet  3  . HYDROcodone-acetaminophen (VICODIN ES) 7.5-750 MG per tablet Take 0.5 tablets by mouth every 3 (three) hours as needed for pain. MAX DOSE is eight - half tabs daily  360 tablet  1  . ipratropium (ATROVENT) 0.06 % nasal spray Place 2 sprays into the nose 3 (three) times daily.  45 mL  1  . metoprolol (LOPRESSOR) 50 MG tablet Take 1 tablet (50 mg total) by mouth 2 (two) times daily.  180 tablet  1  . Multiple Vitamins-Minerals (CENTRUM SILVER PO) Take 1 tablet by mouth daily.      Marland Kitchen omeprazole (PRILOSEC) 20 MG capsule Take 20 mg by mouth daily.      . pantoprazole (PROTONIX) 40 MG tablet TAKE 1 TABLET EVERY MORNING  90 tablet  2  . Rivaroxaban (XARELTO) 20 MG TABS Take 1 tablet (20 mg total) by mouth daily.  90 tablet  3  . Simethicone (GAS-X EXTRA  STRENGTH) 125 MG CAPS Take 1 capsule by mouth daily.      . traMADol (ULTRAM) 50 MG tablet Take 50 mg by mouth every 6 (six) hours as needed. For pain      . potassium chloride SA (K-DUR,KLOR-CON) 20 MEQ tablet Take 1 tablet (20 mEq total) by mouth daily.  30 tablet  0    BP 150/90  Pulse 130  Wt 159 lb (72.122 kg) General: NAD, overweight Neck: No JVD, no thyromegaly or thyroid nodule.  Lungs: Clear to auscultation bilaterally with normal respiratory effort. CV: Nondisplaced PMI.  Heart regular S1/S2, no S3/S4, no murmur.  No peripheral edema.  No carotid bruit.  Normal pedal pulses.  Abdomen: Soft, nontender, no hepatosplenomegaly, no distention.  Skin: Intact without lesions or rashes.  Neurologic: Alert and oriented x 3.   Psych: Normal affect. Extremities: No clubbing or cyanosis.  HEENT: Normal.   Assessment/Plan: 1. Atrial fibrillation: Patient recently admitted with atrial fibrillation with RVR that went back into NSR spontaneously.  Today, her ECG showed NSR with runs of probable atrial tachycardia.  Preserved EF on recent echo.  - Continue Xarelto 20 mg daily (needs anticoagulation, has history of CVA).  - Given ongoing atrial arrhythmias, I am going to have her increase metoprolol to 50 mg bid (was only taking daily).  I will also have her take diltiazem CD 180 mg bid (increased dose).  2. CAD: Patient had mildly elevated troponin.  This may have been a product of demand ischemia from atrial fibrillation with RVR.  However, would like to rule out significantly coronary disease.  I will have her get a Lexiscan myoview.   3. HTN: BP elevated today, but increasing metoprolol and diltiazem CD.  4. Chronic diastolic CHF: Patient is now on Lasix at low dose.  Peripheral edema has subsided.  Continue current dose, get BMET/BNP.   Followup in 1 week to assess rhythm on higher dose of metoprolol and diltiazem.   [name] 04/05/2012

## 2012-04-04 NOTE — Patient Instructions (Addendum)
Your physician has recommended you make the following change in your medication: increase Metoprolol to twice daily and Diltiazem to twice   Your physician recommends that you return for lab work in: today  Your physician recommends that you schedule a follow-up appointment in: next week

## 2012-04-05 ENCOUNTER — Encounter (HOSPITAL_COMMUNITY): Payer: BC Managed Care – PPO

## 2012-04-05 ENCOUNTER — Ambulatory Visit (HOSPITAL_COMMUNITY): Payer: Medicare Other | Attending: Cardiology | Admitting: Radiology

## 2012-04-05 ENCOUNTER — Encounter: Payer: Self-pay | Admitting: Cardiology

## 2012-04-05 VITALS — BP 148/82 | HR 69 | Ht 61.0 in | Wt 159.0 lb

## 2012-04-05 DIAGNOSIS — Z87891 Personal history of nicotine dependence: Secondary | ICD-10-CM | POA: Diagnosis not present

## 2012-04-05 DIAGNOSIS — R0602 Shortness of breath: Secondary | ICD-10-CM | POA: Insufficient documentation

## 2012-04-05 DIAGNOSIS — I4891 Unspecified atrial fibrillation: Secondary | ICD-10-CM | POA: Insufficient documentation

## 2012-04-05 DIAGNOSIS — R9439 Abnormal result of other cardiovascular function study: Secondary | ICD-10-CM | POA: Diagnosis not present

## 2012-04-05 DIAGNOSIS — Z8673 Personal history of transient ischemic attack (TIA), and cerebral infarction without residual deficits: Secondary | ICD-10-CM | POA: Diagnosis not present

## 2012-04-05 DIAGNOSIS — I25810 Atherosclerosis of coronary artery bypass graft(s) without angina pectoris: Secondary | ICD-10-CM | POA: Insufficient documentation

## 2012-04-05 DIAGNOSIS — I10 Essential (primary) hypertension: Secondary | ICD-10-CM | POA: Insufficient documentation

## 2012-04-05 DIAGNOSIS — R079 Chest pain, unspecified: Secondary | ICD-10-CM | POA: Diagnosis not present

## 2012-04-05 MED ORDER — TECHNETIUM TC 99M SESTAMIBI GENERIC - CARDIOLITE
11.0000 | Freq: Once | INTRAVENOUS | Status: AC | PRN
  Administered 2012-04-05: 11 via INTRAVENOUS

## 2012-04-05 MED ORDER — TECHNETIUM TC 99M SESTAMIBI GENERIC - CARDIOLITE
33.0000 | Freq: Once | INTRAVENOUS | Status: AC | PRN
  Administered 2012-04-05: 33 via INTRAVENOUS

## 2012-04-05 MED ORDER — REGADENOSON 0.4 MG/5ML IV SOLN
0.4000 mg | Freq: Once | INTRAVENOUS | Status: AC
  Administered 2012-04-05: 0.4 mg via INTRAVENOUS

## 2012-04-05 NOTE — Progress Notes (Signed)
[facility] SITE 3 NUCLEAR MED [id number] [place] [address] [phone]  Cardiology Nuclear Med Study  Jacqueline [name] is a 78 y.o. female     MRN : [id number]     DOB: 08/24/1928  Procedure Date: 04/05/2012  Nuclear Med Background Indication for Stress Test:  Evaluation for Ischemia and [facility] 03/13/12 with New Onset Atrial Fib with RVR and Elevated Troponins History:  11/13 Echo:EF=65%, mild-moderat e TR Cardiac Risk Factors: CVA, History of Smoking, Hypertension and TIA  Symptoms:  Chest Pain/"Discomfort" (last episode of chest discomfort was about six weeks ago) and SOB   Nuclear Pre-Procedure Caffeine/Decaff Intake:  None NPO After: 6 PM   Lungs:  Clear. O2 Sat: 96% on room air. IV 0.9% NS with Angio Cath:  22g  IV Site: L Hand  IV Started by:  Bonnita Levan, RN  Chest Size (in):  38 Cup Size: C  Height: 5\' 1"  (1.549 m)  Weight:  159 lb (72.122 kg)  BMI:  Body mass index is 30.04 kg/(m^2). Tech Comments:  N/A    Nuclear Med Study 1 or 2 day study: 1 day  Stress Test Type:  Lexiscan  Reading MD: Olga Millers, MD  Order Authorizing Provider:  [name], MD  Resting Radionuclide: Technetium 23m Sestamibi  Resting Radionuclide Dose: 11.0 mCi   Stress Radionuclide:  Technetium 39m Sestamibi  Stress Radionuclide Dose: 33.0 mCi           Stress Protocol Rest HR: 69 Stress HR: 87  Rest BP: 148/82 Stress BP: 148/84  Exercise Time (min): n/a METS: n/a   Predicted Max HR: 139 bpm % Max HR: 62.59 bpm Rate Pressure Product: 62952   Dose of Adenosine (mg):  n/a Dose of Lexiscan: 0.4 mg  Dose of Atropine (mg): n/a Dose of Dobutamine: n/a mcg/kg/min (at max HR)  Stress Test Technologist: Smiley Houseman, CMA-N  Nuclear Technologist:  Domenic Polite, CNMT     Rest Procedure:  Myocardial perfusion imaging was performed at rest 45 minutes following the intravenous administration of Technetium 5m Sestamibi.  Rest ECG: NSR, CRO  prior anterior MI  Stress Procedure:  The patient received IV Lexiscan 0.4 mg over 15-seconds.  Technetium 27m Sestamibi was injected at 30-seconds.  Quantitative spect images were obtained after a 45 minute delay.  Stress ECG: No significant ST segment change suggestive of ischemia.  QPS Raw Data Images:  Acquisition technically good; normal left ventricular size. Stress Images:  Normal homogeneous uptake in all areas of the myocardium. Rest Images:  Normal homogeneous uptake in all areas of the myocardium. Subtraction (SDS):  No evidence of ischemia. Transient Ischemic Dilatation (Normal <1.22):  1.03 Lung/Heart Ratio (Normal <0.45):  0.24  Quantitative Gated Spect Images QGS EDV:  59 ml QGS ESV:  13 ml  Impression Exercise Capacity:  Lexiscan with no exercise. BP Response:  Normal blood pressure response. Clinical Symptoms:  No chest pain or dyspnea ECG Impression:  No significant ST segment change suggestive of ischemia. Comparison with Prior Nuclear Study: No previous nuclear study performed  Overall Impression:  Normal stress nuclear study.  LV Ejection Fraction: 78%.  LV Wall Motion:  NL LV Function; NL Wall Motion  Olga Millers

## 2012-04-09 ENCOUNTER — Telehealth: Payer: Self-pay | Admitting: *Deleted

## 2012-04-09 ENCOUNTER — Telehealth: Payer: Self-pay | Admitting: Cardiology

## 2012-04-09 MED ORDER — DILTIAZEM HCL ER 180 MG PO CP24: 180.0000 mg | ORAL_CAPSULE | Freq: Two times a day (BID) | ORAL | Status: DC

## 2012-04-09 NOTE — Telephone Encounter (Signed)
Refill

## 2012-04-09 NOTE — Telephone Encounter (Signed)
Spoke with pt about myoview results

## 2012-04-09 NOTE — Telephone Encounter (Signed)
Pt needs stress test results called to dr Ranell Patrick office at [phone], so they can ok her  for an injection in shoulder  tomorrow

## 2012-04-10 DIAGNOSIS — M25519 Pain in unspecified shoulder: Secondary | ICD-10-CM | POA: Diagnosis not present

## 2012-04-10 DIAGNOSIS — M25569 Pain in unspecified knee: Secondary | ICD-10-CM | POA: Diagnosis not present

## 2012-04-11 ENCOUNTER — Ambulatory Visit: Payer: Medicare Other | Admitting: Cardiology

## 2012-04-17 ENCOUNTER — Telehealth: Payer: Self-pay | Admitting: Internal Medicine

## 2012-04-17 NOTE — Telephone Encounter (Signed)
Pt is confused about her medication.  She got 3 new medications while in the hospital.  Another one was increased  (Metoprolol). Please call her.

## 2012-04-17 NOTE — Telephone Encounter (Signed)
Best to review all her medications at an office visit. May add on Thursday.

## 2012-04-19 ENCOUNTER — Encounter: Payer: Self-pay | Admitting: Internal Medicine

## 2012-04-19 ENCOUNTER — Ambulatory Visit (INDEPENDENT_AMBULATORY_CARE_PROVIDER_SITE_OTHER): Payer: Medicare Other | Admitting: Internal Medicine

## 2012-04-19 VITALS — BP 142/80 | HR 73 | Resp 12 | Wt 160.0 lb

## 2012-04-19 DIAGNOSIS — Z79899 Other long term (current) drug therapy: Secondary | ICD-10-CM | POA: Diagnosis not present

## 2012-04-19 MED ORDER — POTASSIUM CHLORIDE CRYS ER 20 MEQ PO TBCR: 20.0000 meq | EXTENDED_RELEASE_TABLET | Freq: Every day | ORAL | Status: DC

## 2012-04-19 NOTE — Patient Instructions (Addendum)
See the attached list for the correct meds and doses.

## 2012-04-19 NOTE — Telephone Encounter (Signed)
Appt on Dec 19.

## 2012-04-24 NOTE — Progress Notes (Signed)
  Subjective:    Patient ID: Jacqueline [name], female    DOB: 1928-07-18, 78 y.o.   MRN: [id number]  HPI Jacqueline [name] presents for a review of all her medications. She was recently hospitalized with A. Fib and had changes in medication table. She is on multiple medications and has some confusion about the schedule for taking them. She has been doing well with no new medical complaints.  PMH, FamHx and SocHx reviewed for any changes and relevance. Current Outpatient Prescriptions on File Prior to Visit  Medication Sig Dispense Refill  . ALPRAZolam (XANAX) 0.25 MG tablet Take 0.5 mg by mouth daily. As directed. Up to 8 daily as needed for anxiety      . diltiazem (DILACOR XR) 180 MG 24 hr capsule Take 1 capsule (180 mg total) by mouth 2 (two) times daily.  180 capsule  1  . furosemide (LASIX) 20 MG tablet Take 1 tablet (20 mg total) by mouth daily.  30 tablet  3  . HYDROcodone-acetaminophen (VICODIN ES) 7.5-750 MG per tablet Take 0.5 tablets by mouth every 3 (three) hours as needed for pain. MAX DOSE is eight - half tabs daily  360 tablet  1  . ipratropium (ATROVENT) 0.06 % nasal spray Place 2 sprays into the nose 3 (three) times daily.  45 mL  1  . metoprolol (LOPRESSOR) 50 MG tablet Take 1 tablet (50 mg total) by mouth 2 (two) times daily.  180 tablet  1  . Multiple Vitamins-Minerals (CENTRUM SILVER PO) Take 1 tablet by mouth daily.      . pantoprazole (PROTONIX) 40 MG tablet TAKE 1 TABLET EVERY MORNING  90 tablet  2  . potassium chloride SA (K-DUR,KLOR-CON) 20 MEQ tablet Take 1 tablet (20 mEq total) by mouth daily.  90 tablet  3  . Rivaroxaban (XARELTO) 20 MG TABS Take 1 tablet (20 mg total) by mouth daily.  90 tablet  3  . Simethicone (GAS-X EXTRA STRENGTH) 125 MG CAPS Take 1 capsule by mouth daily.      . traMADol (ULTRAM) 50 MG tablet Take 50 mg by mouth every 6 (six) hours as needed. For pain          Review of Systems System review is negative for any constitutional, cardiac, pulmonary, GI or  neuro symptoms or complaints other than as described in the HPI.     Objective:   Physical Exam Filed Vitals:   04/19/12 1351  BP: 142/80  Pulse: 73  Resp: 12   Cor- RRR Pulm - normal respirations Neuro - A&O x 3, normal gait with use of walker       Assessment & Plan:  Polypharmacy - reviewed all of her medications with her: purpose, dosage and timing. Annotated her med list for her. Encouraged the use of a pill tray. Answered all her questions.  (greater than 50% of 30 min visit spent on education and counseling)

## 2012-05-06 ENCOUNTER — Other Ambulatory Visit: Payer: Self-pay | Admitting: Cardiology

## 2012-05-09 ENCOUNTER — Other Ambulatory Visit: Payer: Self-pay | Admitting: *Deleted

## 2012-05-09 MED ORDER — DILTIAZEM HCL ER 180 MG PO CP24: 180.0000 mg | ORAL_CAPSULE | Freq: Two times a day (BID) | ORAL | Status: DC

## 2012-05-09 NOTE — Telephone Encounter (Signed)
Pt called stating she received a letter from [facility] that they needed a new for her diltiazem faxed to them at [phone].

## 2012-06-05 DIAGNOSIS — M25519 Pain in unspecified shoulder: Secondary | ICD-10-CM | POA: Diagnosis not present

## 2012-06-05 DIAGNOSIS — M25569 Pain in unspecified knee: Secondary | ICD-10-CM | POA: Diagnosis not present

## 2012-06-11 ENCOUNTER — Other Ambulatory Visit: Payer: Self-pay | Admitting: *Deleted

## 2012-06-11 MED ORDER — FUROSEMIDE 20 MG PO TABS: 20.0000 mg | ORAL_TABLET | Freq: Every day | ORAL | Status: DC

## 2012-06-25 ENCOUNTER — Telehealth: Payer: Self-pay | Admitting: Internal Medicine

## 2012-06-25 NOTE — Telephone Encounter (Signed)
Patient needs a new script of Furosemide sent to [facility], she is no longer using [facility], call patient when this has been completed

## 2012-06-26 ENCOUNTER — Other Ambulatory Visit: Payer: Self-pay | Admitting: *Deleted

## 2012-06-26 MED ORDER — FUROSEMIDE 20 MG PO TABS: 20.0000 mg | ORAL_TABLET | Freq: Every day | ORAL | Status: DC

## 2012-07-10 ENCOUNTER — Ambulatory Visit: Payer: Medicare Other | Admitting: Internal Medicine

## 2012-07-12 ENCOUNTER — Ambulatory Visit (INDEPENDENT_AMBULATORY_CARE_PROVIDER_SITE_OTHER): Payer: Medicare Other | Admitting: Internal Medicine

## 2012-07-12 ENCOUNTER — Encounter: Payer: Self-pay | Admitting: Internal Medicine

## 2012-07-12 VITALS — BP 158/110 | HR 95 | Temp 98.2°F | Resp 12 | Wt 156.0 lb

## 2012-07-12 DIAGNOSIS — I4891 Unspecified atrial fibrillation: Secondary | ICD-10-CM | POA: Diagnosis not present

## 2012-07-12 DIAGNOSIS — I10 Essential (primary) hypertension: Secondary | ICD-10-CM | POA: Diagnosis not present

## 2012-07-12 MED ORDER — FUROSEMIDE 40 MG PO TABS: 40.0000 mg | ORAL_TABLET | Freq: Every day | ORAL | Status: DC

## 2012-07-12 MED ORDER — LORATADINE 10 MG PO TABS: 10.0000 mg | ORAL_TABLET | Freq: Every day | ORAL | Status: DC

## 2012-07-12 NOTE — Patient Instructions (Addendum)
1. Hoarsness and laryngitis - no sign of bacterial infection. Lungs are clear. Suspect this may be an allergy type syndrome.  Plan Loratadine (generic claritin) 10 mg once a day  Mucinex DM taken twice a day for cough and to clear secretions  2. Atrial Fibrillation - heart rate is regular today.  Plan Continue Diltiazem and Lopressor as you are  Continue Xarelto 20 mg once a day for stroke prevention  An appointment with Dr. Shirlee Latch will be scheduled for you for routine follow-up  3. Blood pressure - too high today and at last office visit.  Plan Continue diltiazem and lopressor as your are  Increase furosemide to 40 mg daily ( 2 x 20 mg until gone) - new Rx sent to [facility] for the 40 mg tablet.   Laryngitis At the top of your windpipe is your voice box. It is the source of your voice. Inside your voice box are 2 bands of muscles called vocal cords. When you breathe, your vocal cords are relaxed and open so that air can get into the lungs. When you decide to say something, these cords come together and vibrate. The sound from these vibrations goes into your throat and comes out through your mouth as sound. Laryngitis is an inflammation of the vocal cords that causes hoarseness, cough, loss of voice, sore throat, and dry throat. Laryngitis can be temporary (acute) or long-term (chronic). Most cases of acute laryngitis improve with time.Chronic laryngitis lasts for more than 3 weeks. CAUSES Laryngitis can often be related to excessive smoking, talking, or yelling, as well as inhalation of toxic fumes and allergies. Acute laryngitis is usually caused by a viral infection, vocal strain, measles or mumps, or bacterial infections. Chronic laryngitis is usually caused by vocal cord strain, vocal cord injury, postnasal drip, growths on the vocal cords, or acid reflux. SYMPTOMS   Cough.  Sore throat.  Dry throat. RISK FACTORS  Respiratory infections.  Exposure to irritating substances,  such as cigarette smoke, excessive amounts of alcohol, stomach acids, and workplace chemicals.  Voice trauma, such as vocal cord injury from shouting or speaking too loud. DIAGNOSIS  Your cargiver will perform a physical exam. During the physical exam, your caregiver will examine your throat. The most common sign of laryngitis is hoarseness. Laryngoscopy may be necessary to confirm the diagnosis of this condition. This procedure allows your caregiver to look into the larynx. HOME CARE INSTRUCTIONS  Drink enough fluids to keep your urine clear or pale yellow.  Rest until you no longer have symptoms or as directed by your caregiver.  Breathe in moist air.  Take all medicine as directed by your caregiver.  Do not smoke.  Talk as little as possible (this includes whispering).  Write on paper instead of talking until your voice is back to normal.  Follow up with your caregiver if your condition has not improved after 10 days. SEEK MEDICAL CARE IF:   You have trouble breathing.  You cough up blood.  You have persistent fever.  You have increasing pain.  You have difficulty swallowing. MAKE SURE YOU:  Understand these instructions.  Will watch your condition.  Will get help right away if you are not doing well or get worse. Document Released: 04/18/2005 Document Revised: 07/11/2011 Document Reviewed: 06/24/2010 [facility] Patient Information 2013 [place], [place].

## 2012-07-12 NOTE — Progress Notes (Signed)
Subjective:    Patient ID: Jacqueline [name], female    DOB: [birth year], 79 y.o.   MRN: [id number]  HPI Jacqueline [name] presents for several months of intermittent hoarseness and laryngitis; feeling of sinus drainage and some chest congestion. Prolonged coughing will lead to chest soreness. No fever, no chills. She does have some periorbital pressure like pain. No N/V. She does c/o increased SOB.  Past Medical History  Diagnosis Date  . Allergy, unspecified not elsewhere classified   . Other dyspnea and respiratory abnormality   . Other diseases of lung, not elsewhere classified   . Anemia, unspecified   . Unspecified adverse effect of other drug, medicinal and biological substance(995.29)   . Blood in stool   . Hemorrhage of gastrointestinal tract, unspecified   . Hemorrhage of gastrointestinal tract, unspecified   . Personal history of peptic ulcer disease   . Anxiety state, unspecified   . Personal history of other diseases of digestive system   . Unspecified essential hypertension   . Complication of anesthesia     "hard to wake me up after colonoscopy" (03/13/12)  . H/O hiatal hernia   . GERD (gastroesophageal reflux disease)   . Unspecified paranoid state   . Atrial fibrillation with rapid ventricular response 03/13/12  . Osteoarthrosis, unspecified whether generalized or localized, lower leg   . Degeneration of lumbar or lumbosacral intervertebral disc   . Degeneration of lumbar or lumbosacral intervertebral disc   . Unspecified cerebral artery occlusion with cerebral infarction 10/2008    "mini stroke" denies residual (03/13/12)   Past Surgical History  Procedure Laterality Date  . Pilonidal cyst / sinus excision  1957  . Dilation and curettage of uterus  1950's?  . Shoulder arthroscopy  ~ 2000    right   Family History  Problem Relation Age of Onset  . Diabetes Mother   . Other Mother     cva  . Other Father     CVA   History   Social History  . Marital Status: Widowed     Spouse Name: N/A    Number of Children: 4  . Years of Education: 12   Occupational History  . retired    Social History Main Topics  . Smoking status: Former Smoker -- 1.00 packs/day for 40 years    Types: Cigarettes    Quit date: 05/02/2002  . Smokeless tobacco: Never Used  . Alcohol Use: No  . Drug Use: No  . Sexually Active: No   Other Topics Concern  . Not on file   Social History Narrative   HSG. Married: 9- 8 yrs/ divorced; married 1960- 1988/ widowed. 4 grandchildren. 2 daughters- 43, 44; 2 sons- 31, 78. Lives alone- independent ADLs- pet cockatiel.    Current Outpatient Prescriptions on File Prior to Visit  Medication Sig Dispense Refill  . ALPRAZolam (XANAX) 0.25 MG tablet Take 0.5 mg by mouth daily. As directed. Up to 8 daily as needed for anxiety      . diltiazem (DILACOR XR) 180 MG 24 hr capsule Take 1 capsule (180 mg total) by mouth 2 (two) times daily.  180 capsule  1  . furosemide (LASIX) 20 MG tablet Take 1 tablet (20 mg total) by mouth daily.  30 tablet  3  . HYDROcodone-acetaminophen (VICODIN ES) 7.5-750 MG per tablet Take 0.5 tablets by mouth every 3 (three) hours as needed for pain. MAX DOSE is eight - half tabs daily  360 tablet  1  . ipratropium (ATROVENT)  0.06 % nasal spray Place 2 sprays into the nose 3 (three) times daily.  45 mL  1  . metoprolol (LOPRESSOR) 50 MG tablet Take 1 tablet (50 mg total) by mouth 2 (two) times daily.  180 tablet  1  . Multiple Vitamins-Minerals (CENTRUM SILVER PO) Take 1 tablet by mouth daily.      . pantoprazole (PROTONIX) 40 MG tablet TAKE 1 TABLET EVERY MORNING  90 tablet  2  . potassium chloride SA (K-DUR,KLOR-CON) 20 MEQ tablet Take 1 tablet (20 mEq total) by mouth daily.  90 tablet  3  . potassium chloride SA (K-DUR,KLOR-CON) 20 MEQ tablet TAKE 1 TABLET (20 MEQ TOTAL) BY MOUTH DAILY.  30 tablet  11  . Rivaroxaban (XARELTO) 20 MG TABS Take 1 tablet (20 mg total) by mouth daily.  90 tablet  3  . Simethicone  (GAS-X EXTRA STRENGTH) 125 MG CAPS Take 1 capsule by mouth daily.      . traMADol (ULTRAM) 50 MG tablet Take 50 mg by mouth every 6 (six) hours as needed. For pain       No current facility-administered medications on file prior to visit.      Review of Systems System review is negative for any constitutional, cardiac, pulmonary, GI or neuro symptoms or complaints other than as described in the HPI.     Objective:   Physical Exam Filed Vitals:   07/12/12 1602  BP: 158/110  Pulse: 95  Temp: 98.2 F (36.8 C)  Resp: 12   Gen'l- WNWD white woman in no distress HEENT - no sinus tenderness to percussion Neck - supple, no thyromegaly Cor 2+ radial pulse, RRR Pulm - normal respirations, no rales or wheezes. Neuro - awake and alert.        Assessment & Plan:  1. Hoarsness and laryngitis - no sign of bacterial infection. Lungs are clear. Suspect this may be an allergy type syndrome.  Plan Loratadine (generic claritin) 10 mg once a day  Mucinex DM taken twice a day for cough and to clear secretions

## 2012-07-15 NOTE — Assessment & Plan Note (Signed)
Stable sinus rhythm today. She was last seen by Dr. Shirlee Latch Dec 4th, 2013 at which time her diltiazem was increased. Due to previous h/o CVA and high ChadsVasc score she is continued on Xarelto.  Plan  Continue present regimen  Follow up with Dr. Shirlee Latch as instructed.

## 2012-07-15 NOTE — Assessment & Plan Note (Signed)
BP Readings from Last 3 Encounters:  07/12/12 158/110  04/19/12 142/80  04/05/12 148/82   Sub-optimal control at today's visit.  Plan Continue present dose of diltiazem and BB  Increase furosemide to 40 mg daily  BP check

## 2012-07-19 DIAGNOSIS — M1710 Unilateral primary osteoarthritis, unspecified knee: Secondary | ICD-10-CM | POA: Diagnosis not present

## 2012-07-19 DIAGNOSIS — IMO0002 Reserved for concepts with insufficient information to code with codable children: Secondary | ICD-10-CM | POA: Diagnosis not present

## 2012-07-19 DIAGNOSIS — M25519 Pain in unspecified shoulder: Secondary | ICD-10-CM | POA: Diagnosis not present

## 2012-07-26 ENCOUNTER — Telehealth: Payer: Self-pay | Admitting: Internal Medicine

## 2012-07-26 NOTE — Telephone Encounter (Signed)
I called and let pt know to continue Mucinex as long as she has a cough, no need for OV. She expressed understanding and had no questions or concerns.

## 2012-07-26 NOTE — Telephone Encounter (Signed)
Please advise 

## 2012-07-26 NOTE — Telephone Encounter (Signed)
Caller: Foy/Patient; Phone: [phone]; Reason for Call: Patient was told to take Mucinex for her cough.  She is still having a slight dry cough at times.  Patient wants to know if she should continue taking Mucinex.  She reports the cough is "considerably better" than before.  Patient reports weakness at times, none today.  Offered to schedule an appointment, but patient would prefer to see what Dr.  Debby Bud would recommend before having to come in for an appointment.

## 2012-07-26 NOTE — Telephone Encounter (Signed)
Ok to continue mucinex as long as she has a cough. No need for OV

## 2012-08-16 ENCOUNTER — Telehealth: Payer: Self-pay | Admitting: Internal Medicine

## 2012-08-16 MED ORDER — HYDROCODONE-ACETAMINOPHEN 7.5-325 MG PO TABS: ORAL_TABLET | ORAL | Status: DC

## 2012-08-16 MED ORDER — ALPRAZOLAM 0.25 MG PO TABS: 0.5000 mg | ORAL_TABLET | Freq: Every day | ORAL | Status: DC

## 2012-08-16 NOTE — Telephone Encounter (Signed)
Faxed hardcopy's to [facility].

## 2012-08-16 NOTE — Telephone Encounter (Signed)
Done hardcopy to robin  

## 2012-08-17 ENCOUNTER — Telehealth: Payer: Self-pay

## 2012-08-17 MED ORDER — ALPRAZOLAM 0.25 MG PO TABS: ORAL_TABLET | ORAL | Status: DC

## 2012-08-17 NOTE — Telephone Encounter (Signed)
[facility] received prescription for Alprazolam and states the directions were unclear.  Please clarify directions to [facility]

## 2012-08-17 NOTE — Telephone Encounter (Signed)
Faxed corrected prescription to [facility]

## 2012-08-17 NOTE — Telephone Encounter (Signed)
rx corrected - Done hardcopy to robin  

## 2012-08-20 ENCOUNTER — Telehealth: Payer: Self-pay | Admitting: Internal Medicine

## 2012-08-20 NOTE — Telephone Encounter (Signed)
Patient Information:  Caller Name: Jacqueline [name]  Phone: [phone]  Patient: Jacqueline [name], Jacqueline [name]  Gender: Female  DOB: 08/03/1928  Age: 79 Years  PCP: Illene Regulus (Adults only)  Office Follow Up:  Does the office need to follow up with this patient?: Yes  Instructions For The Office: Patient would like Dr. Debby Bud input before she schedules appt. ongoing scratchy voice and hoarseness since 06/2012.  RN Note:  Patient wants Dr. Debby Bud input before she schedules and appt. Voice is scratchy and hoarse. Please contact patient for assistance  Symptoms  Reason For Call & Symptoms: (1) Patient would like to try an OTC product her daughter is using for throat and congestion issues.  Her Daughter loves the product and she wants to try the medication "Doterra Breathing", it has Leafoil, peppermint oil Eucolyptus and coconut oil and you rub it on your throat.Use one drop and apply. She is concerned about medication interaction ?  (2)  Dr. Debby Bud had prescribed her Mucinex D and she continues with congestion, hoarse voice She was placed on Loratadine and Mucinex DM on 07/12/12. She states she has already finished a box of the mucinex ? what else can she do? Voice is hoarse and scratchy, no sore throat, no cough,no fever but weak.  Reviewed Health History In EMR: Yes  Reviewed Medications In EMR: Yes  Reviewed Allergies In EMR: Yes  Reviewed Surgeries / Procedures: Yes  Date of Onset of Symptoms: 07/13/2012  Treatments Tried: Mucinex DM, Ipratrapium nasal spray, green tea  Treatments Tried Worked: No  Guideline(s) Used:  Sore Throat  Disposition Per Guideline:   Strep Test Only Visit Today or Tomorrow  Reason For Disposition Reached:   Sore throat with cough/cold symptoms present > 5 days  Advice Given:  For Relief of Sore Throat Pain:  Sip warm chicken broth or apple juice.  Suck on hard candy or a throat lozenge (over-the-counter).  Gargle warm salt water 3 times daily (1 teaspoon of salt in 8 oz or  240 ml of warm water).  Avoid cigarette smoke.  Liquids:  Adequate liquid intake is important to prevent dehydration. Drink 6-8 glasses of water per day.  Call Back If:  Fever lasts longer than 3 days  You become worse.  Expected Course:  Sore throats with viral illnesses usually last 3 or 4 days.  RN Overrode Recommendation:  Follow Up With Office Later  Patient would like Dr. Debby Bud input before she schedules appt. ongoing scratchy voice and hoarseness since 06/2012.

## 2012-08-20 NOTE — Telephone Encounter (Signed)
Pt advised and expressed understanding.

## 2012-08-20 NOTE — Telephone Encounter (Signed)
Okay to try herbal remedy as described. Continue Mucinex and Claritin daily, no other new recommendations at this time

## 2012-08-28 ENCOUNTER — Telehealth: Payer: Self-pay

## 2012-08-28 MED ORDER — HYDROCODONE-ACETAMINOPHEN 7.5-325 MG PO TABS: ORAL_TABLET | ORAL | Status: DC

## 2012-08-28 NOTE — Telephone Encounter (Signed)
The patients hydrocodone and alprazolam were sent in to [facility] on 08/16/12.  The patient received her alprazolam on Saturday, but not the hydrocodone.  She called [facility] on 08/27/12 and was informed the hydrocodone was shipped on 08/27/12.  She is currently out and would like enough sent in to her local pharmacy [facility] [address]. Until mailorder  Arrives. Please advise

## 2012-08-28 NOTE — Telephone Encounter (Signed)
The patient needs a short supply of hydrocodone sent to her local pharmacy until her mail order comes in.

## 2012-08-28 NOTE — Telephone Encounter (Signed)
Ok - to robin to handle 

## 2012-08-28 NOTE — Telephone Encounter (Signed)
Patient informed hardcopy sent to [facility] [address] per pt. Request.

## 2012-08-28 NOTE — Telephone Encounter (Signed)
Done hardcopy to robin  

## 2012-09-10 ENCOUNTER — Telehealth: Payer: Self-pay | Admitting: Internal Medicine

## 2012-09-10 NOTE — Telephone Encounter (Signed)
I spoke with pt. She was last seen 12/2008. Pt has no pending appt. I advised pt to call her PCP to see for recs or for appt. Nothing further was needed

## 2012-09-11 DIAGNOSIS — M25519 Pain in unspecified shoulder: Secondary | ICD-10-CM | POA: Diagnosis not present

## 2012-09-11 DIAGNOSIS — M1710 Unilateral primary osteoarthritis, unspecified knee: Secondary | ICD-10-CM | POA: Diagnosis not present

## 2012-10-02 ENCOUNTER — Ambulatory Visit (INDEPENDENT_AMBULATORY_CARE_PROVIDER_SITE_OTHER): Payer: Medicare Other | Admitting: Internal Medicine

## 2012-10-02 ENCOUNTER — Encounter: Payer: Self-pay | Admitting: Internal Medicine

## 2012-10-02 ENCOUNTER — Other Ambulatory Visit (INDEPENDENT_AMBULATORY_CARE_PROVIDER_SITE_OTHER): Payer: Medicare Other

## 2012-10-02 VITALS — BP 134/96 | HR 78 | Temp 97.1°F | Wt 154.8 lb

## 2012-10-02 DIAGNOSIS — I6350 Cerebral infarction due to unspecified occlusion or stenosis of unspecified cerebral artery: Secondary | ICD-10-CM | POA: Diagnosis not present

## 2012-10-02 DIAGNOSIS — T7840XA Allergy, unspecified, initial encounter: Secondary | ICD-10-CM | POA: Diagnosis not present

## 2012-10-02 DIAGNOSIS — Z8711 Personal history of peptic ulcer disease: Secondary | ICD-10-CM

## 2012-10-02 DIAGNOSIS — I10 Essential (primary) hypertension: Secondary | ICD-10-CM

## 2012-10-02 DIAGNOSIS — K922 Gastrointestinal hemorrhage, unspecified: Secondary | ICD-10-CM

## 2012-10-02 DIAGNOSIS — F411 Generalized anxiety disorder: Secondary | ICD-10-CM

## 2012-10-02 DIAGNOSIS — I25810 Atherosclerosis of coronary artery bypass graft(s) without angina pectoris: Secondary | ICD-10-CM

## 2012-10-02 DIAGNOSIS — I4891 Unspecified atrial fibrillation: Secondary | ICD-10-CM

## 2012-10-02 DIAGNOSIS — I480 Paroxysmal atrial fibrillation: Secondary | ICD-10-CM

## 2012-10-02 LAB — HEMOGLOBIN AND HEMATOCRIT, BLOOD: HCT: 42 % (ref 36.0–46.0)

## 2012-10-02 LAB — COMPREHENSIVE METABOLIC PANEL
ALT: 23 U/L (ref 0–35)
AST: 20 U/L (ref 0–37)
Albumin: 3.8 g/dL (ref 3.5–5.2)
Alkaline Phosphatase: 68 U/L (ref 39–117)
BUN: 19 mg/dL (ref 6–23)
Calcium: 9.8 mg/dL (ref 8.4–10.5)
Chloride: 99 mEq/L (ref 96–112)
Potassium: 3.8 mEq/L (ref 3.5–5.1)
Sodium: 139 mEq/L (ref 135–145)

## 2012-10-02 MED ORDER — ALPRAZOLAM 0.25 MG PO TABS: ORAL_TABLET | ORAL | Status: DC

## 2012-10-02 NOTE — Progress Notes (Signed)
Subjective:    Patient ID: Jacqueline [name], female    DOB: 25-Jul-1928, 79 y.o.   MRN: [id number]  HPI Mrs. Brossard presents for follow up of multiple medical problems. She reports that she did have a fall: she tends to lean forward and she was reaching forward and lost her balance tumbling over w/o LOC. She was badly bruised. No head injury. She has does have some swelling of the left lateral ankle but no pain with weight bearing.   She last Dr. Shirlee Latch in December. She had a myoview in Dec '1`3 - no ischemia. She has not made a follow up but recently called and is to see Dr. Shirlee Latch in August '14  Reviewed her meds and tried to insure she had all her refills  Past Medical History  Diagnosis Date  . Allergy, unspecified not elsewhere classified   . Other dyspnea and respiratory abnormality   . Other diseases of lung, not elsewhere classified   . Anemia, unspecified   . Unspecified adverse effect of other drug, medicinal and biological substance(995.29)   . Blood in stool   . Hemorrhage of gastrointestinal tract, unspecified   . Hemorrhage of gastrointestinal tract, unspecified   . Personal history of peptic ulcer disease   . Anxiety state, unspecified   . Personal history of other diseases of digestive system   . Unspecified essential hypertension   . Complication of anesthesia     "hard to wake me up after colonoscopy" (03/13/12)  . H/O hiatal hernia   . GERD (gastroesophageal reflux disease)   . Unspecified paranoid state   . Atrial fibrillation with rapid ventricular response 03/13/12  . Osteoarthrosis, unspecified whether generalized or localized, lower leg   . Degeneration of lumbar or lumbosacral intervertebral disc   . Degeneration of lumbar or lumbosacral intervertebral disc   . Unspecified cerebral artery occlusion with cerebral infarction 10/2008    "mini stroke" denies residual (03/13/12)   Past Surgical History  Procedure Laterality Date  . Pilonidal cyst / sinus excision   1957  . Dilation and curettage of uterus  1950's?  . Shoulder arthroscopy  ~ 2000    right   Family History  Problem Relation Age of Onset  . Diabetes Mother   . Other Mother     cva  . Other Father     CVA   Current Outpatient Prescriptions on File Prior to Visit  Medication Sig Dispense Refill  . diltiazem (DILACOR XR) 180 MG 24 hr capsule Take 1 capsule (180 mg total) by mouth 2 (two) times daily.  180 capsule  1  . furosemide (LASIX) 40 MG tablet Take 1 tablet (40 mg total) by mouth daily.  30 tablet  3  . HYDROcodone-acetaminophen (NORCO) 7.5-325 MG per tablet 1/2 tablet by mouth every 3 hours as needed for pain, with maximum eight  - Half tabs per day  30 tablet  0  . ipratropium (ATROVENT) 0.06 % nasal spray Place 2 sprays into the nose 3 (three) times daily.  45 mL  1  . loratadine (CLARITIN) 10 MG tablet Take 1 tablet (10 mg total) by mouth daily.  30 tablet  11  . metoprolol (LOPRESSOR) 50 MG tablet Take 1 tablet (50 mg total) by mouth 2 (two) times daily.  180 tablet  1  . Multiple Vitamins-Minerals (CENTRUM SILVER PO) Take 1 tablet by mouth daily.      . pantoprazole (PROTONIX) 40 MG tablet TAKE 1 TABLET EVERY MORNING  90 tablet  2  . potassium chloride SA (K-DUR,KLOR-CON) 20 MEQ tablet Take 1 tablet (20 mEq total) by mouth daily.  90 tablet  3  . potassium chloride SA (K-DUR,KLOR-CON) 20 MEQ tablet TAKE 1 TABLET (20 MEQ TOTAL) BY MOUTH DAILY.  30 tablet  11  . Rivaroxaban (XARELTO) 20 MG TABS Take 1 tablet (20 mg total) by mouth daily.  90 tablet  3  . Simethicone (GAS-X EXTRA STRENGTH) 125 MG CAPS Take 1 capsule by mouth daily.      . traMADol (ULTRAM) 50 MG tablet Take 50 mg by mouth every 6 (six) hours as needed. For pain       No current facility-administered medications on file prior to visit.    History   Social History  . Marital Status: Widowed    Spouse Name: N/A    Number of Children: 4  . Years of Education: 12   Occupational History  . retired     Social History Main Topics  . Smoking status: Former Smoker -- 1.00 packs/day for 40 years    Types: Cigarettes    Quit date: 05/02/2002  . Smokeless tobacco: Never Used  . Alcohol Use: No  . Drug Use: No  . Sexually Active: No   Other Topics Concern  . Not on file   Social History Narrative   HSG. Married: 12- 8 yrs/ divorced; married 1960- 1988/ widowed. 4 grandchildren. 2 daughters- 52, 28; 2 sons- 29, 72. Lives alone- independent ADLs- pet cockatiel.   Current Outpatient Prescriptions on File Prior to Visit  Medication Sig Dispense Refill  . diltiazem (DILACOR XR) 180 MG 24 hr capsule Take 1 capsule (180 mg total) by mouth 2 (two) times daily.  180 capsule  1  . furosemide (LASIX) 40 MG tablet Take 1 tablet (40 mg total) by mouth daily.  30 tablet  3  . HYDROcodone-acetaminophen (NORCO) 7.5-325 MG per tablet 1/2 tablet by mouth every 3 hours as needed for pain, with maximum eight  - Half tabs per day  30 tablet  0  . metoprolol (LOPRESSOR) 50 MG tablet Take 1 tablet (50 mg total) by mouth 2 (two) times daily.  180 tablet  1  . Multiple Vitamins-Minerals (CENTRUM SILVER PO) Take 1 tablet by mouth daily.      . pantoprazole (PROTONIX) 40 MG tablet TAKE 1 TABLET EVERY MORNING  90 tablet  2  . potassium chloride SA (K-DUR,KLOR-CON) 20 MEQ tablet Take 1 tablet (20 mEq total) by mouth daily.  90 tablet  3  . Rivaroxaban (XARELTO) 20 MG TABS Take 1 tablet (20 mg total) by mouth daily.  90 tablet  3  . Simethicone (GAS-X EXTRA STRENGTH) 125 MG CAPS Take 1 capsule by mouth daily.      . traMADol (ULTRAM) 50 MG tablet Take 50 mg by mouth every 6 (six) hours as needed. For pain       No current facility-administered medications on file prior to visit.         Review of Systems Constitutional:  Negative for fever, chills, activity change and unexpected weight change.  HEENT:  Negative for hearing loss, ear pain, congestion, neck stiffness and postnasal drip. Negative for  sore throat or swallowing problems. Negative for dental complaints.   Eyes: Negative for vision loss or change in visual acuity.  Respiratory: Negative for chest tightness and wheezing. Negative for DOE.   Cardiovascular: Negative for chest pain or palpitations. No decreased exercise tolerance Gastrointestinal: No change in bowel habit.  No bloating or gas. No reflux or indigestion Genitourinary: Negative for urgency, frequency, flank pain and difficulty urinating.  Musculoskeletal: Negative for myalgias, back pain, arthralgias and gait problem.  Neurological: Negative for dizziness, tremors, weakness and headaches.  Hematological: Negative for adenopathy.  Psychiatric/Behavioral: Negative for behavioral problems and dysphoric mood.       Objective:   Physical Exam Filed Vitals:   10/02/12 1520  BP: 134/96  Pulse: 78  Temp: 97.1 F (36.2 C)   Wt Readings from Last 3 Encounters:  10/02/12 154 lb 12.8 oz (70.217 kg)  07/12/12 156 lb (70.761 kg)  04/19/12 160 lb 0.6 oz (72.594 kg)   BP Readings from Last 3 Encounters:  10/02/12 134/96  07/12/12 158/110  04/19/12 142/80   Gen'l- Older white woman in no acute distress, has resolving bruising on UE and LE HEENT- C&S clear, PERRLA Neck- - no thyromegaly, increased flesh under chin Nodes - negative for cervical adenopathy Cor 2+ radial pulse, quiet precordium, RRR Pulm - increased AP diameter, Clear to A&P Neuro - A&O x 3, speech is clear, cognition is normal. CN II-XII grossly intact. Cerebellar - no tremor, slow gait, no retropulsion. Requires 1+ assistance in transfers mostly for balance (uses walker at home). Derm - small varicose veins. Resolving bruises left UE, Left LE.        Assessment & Plan:

## 2012-10-02 NOTE — Patient Instructions (Addendum)
1. Medications - hopefully we have it straight: new Rx for alprazolam sent to [facility] and you can let me know when you need new prescritions  2. Heart - the EKG shows your are in a normal rhythm today.  Plan Continue your present medications  Please keep the appointment with Dr. Shirlee Latch in [place] - but it is ok to wait.  3. Allergy - ok to use the nasal saline and to stop the ipratropium spray  4. Will check routine lab work today and results will be sent to you.   5. If you have recurrent and severe hoarseness, that is not allergy related, an ENT doctor is who would look for throat/vocal cord disease.

## 2012-10-02 NOTE — Progress Notes (Signed)
Script for Alprazolam has been faxed to [facility] [phone]

## 2012-10-03 DIAGNOSIS — H524 Presbyopia: Secondary | ICD-10-CM | POA: Diagnosis not present

## 2012-10-03 DIAGNOSIS — I480 Paroxysmal atrial fibrillation: Secondary | ICD-10-CM | POA: Insufficient documentation

## 2012-10-03 DIAGNOSIS — H52229 Regular astigmatism, unspecified eye: Secondary | ICD-10-CM | POA: Diagnosis not present

## 2012-10-03 DIAGNOSIS — H5231 Anisometropia: Secondary | ICD-10-CM | POA: Diagnosis not present

## 2012-10-03 DIAGNOSIS — H43819 Vitreous degeneration, unspecified eye: Secondary | ICD-10-CM | POA: Diagnosis not present

## 2012-10-03 NOTE — Assessment & Plan Note (Signed)
No complaints of syncope or near-syncope. EKG today reveals NSR.  Plan Continue present medical regimen  See Dr. Shirlee Latch in August as scheduled.

## 2012-10-03 NOTE — Assessment & Plan Note (Signed)
No signs or symptoms of recurrent GI bleeding or acute anemia.

## 2012-10-03 NOTE — Assessment & Plan Note (Signed)
Is allergic to her cockatiel but the bird stays. She has ipatropium nasal spray and is using saline nasal wash. He post-nasal drainage and hoarseness is better at this time. She has a concern about head and neck cancer since a friend with hoarseness was diagnosed.  Plan Continue nasal saline wash  For persistent/recurrent hoarseness or laryngitis ENT consult will be helpful to assuage her fears.

## 2012-10-03 NOTE — Assessment & Plan Note (Signed)
Stable w/ no complaints of chest pain or cardiac symptoms. She is on Xarelto. Last cardiology visit with Dr. Shirlee Latch in Dec '13 with a follow up scheduled for august. EKG today was normal.   Plan Continue present medical regimen.

## 2012-10-03 NOTE — Assessment & Plan Note (Signed)
Doing OK on 0.125mg  alprazolam qid.  Plan Rx renewed.

## 2012-10-03 NOTE — Assessment & Plan Note (Signed)
Adequate control:  BP Readings from Last 3 Encounters:  10/02/12 134/96  07/12/12 158/110  04/19/12 142/80

## 2012-10-03 NOTE — Assessment & Plan Note (Signed)
Patient has fallen and reports that she has balance problems particularly when she leans forward - will tumble. Exam does not reveal retropulsion or instability. She is on Xarelto  Plan Continue use of walker  Advised to avoid activities that involve leaning forward.

## 2012-10-05 ENCOUNTER — Encounter: Payer: Self-pay | Admitting: Internal Medicine

## 2012-10-06 ENCOUNTER — Other Ambulatory Visit: Payer: Self-pay | Admitting: Internal Medicine

## 2012-10-18 DIAGNOSIS — M1710 Unilateral primary osteoarthritis, unspecified knee: Secondary | ICD-10-CM | POA: Diagnosis not present

## 2012-10-18 DIAGNOSIS — M25519 Pain in unspecified shoulder: Secondary | ICD-10-CM | POA: Diagnosis not present

## 2012-11-12 ENCOUNTER — Other Ambulatory Visit: Payer: Self-pay | Admitting: Internal Medicine

## 2012-11-22 DIAGNOSIS — M1710 Unilateral primary osteoarthritis, unspecified knee: Secondary | ICD-10-CM | POA: Diagnosis not present

## 2012-11-22 DIAGNOSIS — M25519 Pain in unspecified shoulder: Secondary | ICD-10-CM | POA: Diagnosis not present

## 2012-11-26 ENCOUNTER — Inpatient Hospital Stay (HOSPITAL_COMMUNITY)
Admission: EM | Admit: 2012-11-26 | Discharge: 2012-11-29 | DRG: 066 | Disposition: A | Discharge status: Home Health | Payer: Medicare Other | Attending: Internal Medicine | Admitting: Internal Medicine

## 2012-11-26 ENCOUNTER — Encounter (HOSPITAL_COMMUNITY): Payer: Self-pay

## 2012-11-26 ENCOUNTER — Emergency Department (HOSPITAL_COMMUNITY): Payer: Medicare Other

## 2012-11-26 ENCOUNTER — Other Ambulatory Visit: Payer: Self-pay

## 2012-11-26 DIAGNOSIS — K449 Diaphragmatic hernia without obstruction or gangrene: Secondary | ICD-10-CM | POA: Diagnosis present

## 2012-11-26 DIAGNOSIS — K921 Melena: Secondary | ICD-10-CM | POA: Diagnosis not present

## 2012-11-26 DIAGNOSIS — R42 Dizziness and giddiness: Secondary | ICD-10-CM | POA: Diagnosis present

## 2012-11-26 DIAGNOSIS — M5137 Other intervertebral disc degeneration, lumbosacral region: Secondary | ICD-10-CM | POA: Diagnosis present

## 2012-11-26 DIAGNOSIS — I25810 Atherosclerosis of coronary artery bypass graft(s) without angina pectoris: Secondary | ICD-10-CM | POA: Diagnosis not present

## 2012-11-26 DIAGNOSIS — R112 Nausea with vomiting, unspecified: Secondary | ICD-10-CM | POA: Diagnosis not present

## 2012-11-26 DIAGNOSIS — Z87891 Personal history of nicotine dependence: Secondary | ICD-10-CM | POA: Diagnosis not present

## 2012-11-26 DIAGNOSIS — I6340 Cerebral infarction due to embolism of unspecified cerebral artery: Principal | ICD-10-CM | POA: Diagnosis present

## 2012-11-26 DIAGNOSIS — F411 Generalized anxiety disorder: Secondary | ICD-10-CM | POA: Diagnosis not present

## 2012-11-26 DIAGNOSIS — E785 Hyperlipidemia, unspecified: Secondary | ICD-10-CM

## 2012-11-26 DIAGNOSIS — R5381 Other malaise: Secondary | ICD-10-CM | POA: Diagnosis not present

## 2012-11-26 DIAGNOSIS — Z79899 Other long term (current) drug therapy: Secondary | ICD-10-CM | POA: Diagnosis not present

## 2012-11-26 DIAGNOSIS — D649 Anemia, unspecified: Secondary | ICD-10-CM

## 2012-11-26 DIAGNOSIS — R7309 Other abnormal glucose: Secondary | ICD-10-CM | POA: Diagnosis present

## 2012-11-26 DIAGNOSIS — Z8711 Personal history of peptic ulcer disease: Secondary | ICD-10-CM | POA: Diagnosis not present

## 2012-11-26 DIAGNOSIS — Z8673 Personal history of transient ischemic attack (TIA), and cerebral infarction without residual deficits: Secondary | ICD-10-CM | POA: Diagnosis not present

## 2012-11-26 DIAGNOSIS — K922 Gastrointestinal hemorrhage, unspecified: Secondary | ICD-10-CM | POA: Diagnosis present

## 2012-11-26 DIAGNOSIS — I10 Essential (primary) hypertension: Secondary | ICD-10-CM | POA: Diagnosis not present

## 2012-11-26 DIAGNOSIS — E669 Obesity, unspecified: Secondary | ICD-10-CM | POA: Diagnosis present

## 2012-11-26 DIAGNOSIS — K649 Unspecified hemorrhoids: Secondary | ICD-10-CM | POA: Diagnosis present

## 2012-11-26 DIAGNOSIS — I480 Paroxysmal atrial fibrillation: Secondary | ICD-10-CM

## 2012-11-26 DIAGNOSIS — I4891 Unspecified atrial fibrillation: Secondary | ICD-10-CM | POA: Diagnosis not present

## 2012-11-26 DIAGNOSIS — Z7901 Long term (current) use of anticoagulants: Secondary | ICD-10-CM | POA: Diagnosis not present

## 2012-11-26 DIAGNOSIS — M51379 Other intervertebral disc degeneration, lumbosacral region without mention of lumbar back pain or lower extremity pain: Secondary | ICD-10-CM | POA: Diagnosis present

## 2012-11-26 DIAGNOSIS — Z6829 Body mass index (BMI) 29.0-29.9, adult: Secondary | ICD-10-CM | POA: Diagnosis not present

## 2012-11-26 DIAGNOSIS — Z833 Family history of diabetes mellitus: Secondary | ICD-10-CM

## 2012-11-26 DIAGNOSIS — R5383 Other fatigue: Secondary | ICD-10-CM | POA: Diagnosis not present

## 2012-11-26 DIAGNOSIS — K219 Gastro-esophageal reflux disease without esophagitis: Secondary | ICD-10-CM | POA: Diagnosis present

## 2012-11-26 DIAGNOSIS — I6350 Cerebral infarction due to unspecified occlusion or stenosis of unspecified cerebral artery: Secondary | ICD-10-CM | POA: Diagnosis not present

## 2012-11-26 DIAGNOSIS — R404 Transient alteration of awareness: Secondary | ICD-10-CM | POA: Diagnosis not present

## 2012-11-26 DIAGNOSIS — Z823 Family history of stroke: Secondary | ICD-10-CM | POA: Diagnosis not present

## 2012-11-26 DIAGNOSIS — G459 Transient cerebral ischemic attack, unspecified: Secondary | ICD-10-CM

## 2012-11-26 DIAGNOSIS — I259 Chronic ischemic heart disease, unspecified: Secondary | ICD-10-CM | POA: Diagnosis not present

## 2012-11-26 DIAGNOSIS — I639 Cerebral infarction, unspecified: Secondary | ICD-10-CM

## 2012-11-26 DIAGNOSIS — I369 Nonrheumatic tricuspid valve disorder, unspecified: Secondary | ICD-10-CM | POA: Diagnosis not present

## 2012-11-26 LAB — URINALYSIS, ROUTINE W REFLEX MICROSCOPIC
Leukocytes, UA: NEGATIVE
Nitrite: NEGATIVE
Specific Gravity, Urine: 1.021 (ref 1.005–1.030)
Urobilinogen, UA: 1 mg/dL (ref 0.0–1.0)
pH: 7 (ref 5.0–8.0)

## 2012-11-26 LAB — CBC WITH DIFFERENTIAL/PLATELET
Eosinophils Absolute: 0 10*3/uL (ref 0.0–0.7)
Eosinophils Relative: 0 % (ref 0–5)
HCT: 42.3 % (ref 36.0–46.0)
Lymphocytes Relative: 11 % — ABNORMAL LOW (ref 12–46)
Lymphs Abs: 0.8 10*3/uL (ref 0.7–4.0)
MCH: 34.7 pg — ABNORMAL HIGH (ref 26.0–34.0)
MCV: 97.9 fL (ref 78.0–100.0)
Monocytes Absolute: 0.8 10*3/uL (ref 0.1–1.0)
RBC: 4.32 MIL/uL (ref 3.87–5.11)
RDW: 14.7 % (ref 11.5–15.5)
WBC: 7.2 10*3/uL (ref 4.0–10.5)

## 2012-11-26 LAB — COMPREHENSIVE METABOLIC PANEL
CO2: 29 mEq/L (ref 19–32)
Calcium: 9.2 mg/dL (ref 8.4–10.5)
Creatinine, Ser: 0.65 mg/dL (ref 0.50–1.10)
GFR calc Af Amer: >90 mL/min (ref >90)
GFR calc non Af Amer: 80 mL/min — ABNORMAL LOW (ref >90)
Glucose, Bld: 143 mg/dL — ABNORMAL HIGH (ref 70–99)
Total Protein: 6.4 g/dL (ref 6.0–8.3)

## 2012-11-26 LAB — TROPONIN I: Troponin I: <0.3 ng/mL (ref <0.30)

## 2012-11-26 LAB — TYPE AND SCREEN: Antibody Screen: NEGATIVE

## 2012-11-26 LAB — OCCULT BLOOD, POC DEVICE: Fecal Occult Bld: NEGATIVE

## 2012-11-26 LAB — ABO/RH: ABO/RH(D): O POS

## 2012-11-26 MED ORDER — ALPRAZOLAM 0.25 MG PO TABS: ORAL_TABLET | ORAL | Status: DC

## 2012-11-26 NOTE — ED Provider Notes (Signed)
CSN: [id number]     Arrival date & time 11/26/12  1923 History     First MD Initiated Contact with Patient 11/26/12 1950     Chief Complaint  Patient presents with  . Weakness   (Consider location/radiation/quality/duration/timing/severity/associated sxs/prior Treatment) HPI  Jacqueline [name] is a 79 y.o.female with a significant PMH of  Anemia, blood in stool,  GI bleed, a. Fib, hernia, anxiety, stroke  presents to the ER with complaints of  Feeling weak and dizzy since this morning. She also reports feeling confused and fuzzy headed earlier today. Her family members are present and said she is actintg baseline but has been more forgetful lately then normal. Pt thinks she may have doubled up on her alprazolam and Vicodin because she couldn't remember if she had taken it. She also continues to have rectal bleeding. She reports having labs done by her PCP Dr. Debby Bud 1 month ago and all of her results being normal. Bowel movements have been normal. Denies headache, change in vision, chest pain, constipation, vaginal bleeding, dysuria, abdominal pain, focal weakness, neck pains, fever.   Past Medical History  Diagnosis Date  . Allergy, unspecified not elsewhere classified   . Other dyspnea and respiratory abnormality   . Other diseases of lung, not elsewhere classified   . Anemia, unspecified   . Unspecified adverse effect of other drug, medicinal and biological substance(995.29)   . Blood in stool   . Hemorrhage of gastrointestinal tract, unspecified   . Hemorrhage of gastrointestinal tract, unspecified   . Personal history of peptic ulcer disease   . Anxiety state, unspecified   . Personal history of other diseases of digestive system   . Unspecified essential hypertension   . Complication of anesthesia     "hard to wake me up after colonoscopy" (03/13/12)  . H/O hiatal hernia   . GERD (gastroesophageal reflux disease)   . Unspecified paranoid state   . Atrial fibrillation with rapid  ventricular response 03/13/12  . Osteoarthrosis, unspecified whether generalized or localized, lower leg   . Degeneration of lumbar or lumbosacral intervertebral disc   . Degeneration of lumbar or lumbosacral intervertebral disc   . Unspecified cerebral artery occlusion with cerebral infarction 10/2008    "mini stroke" denies residual (03/13/12)   Past Surgical History  Procedure Laterality Date  . Pilonidal cyst / sinus excision  1957  . Dilation and curettage of uterus  1950's?  . Shoulder arthroscopy  ~ 2000    right   Family History  Problem Relation Age of Onset  . Diabetes Mother   . Other Mother     cva  . Other Father     CVA   History  Substance Use Topics  . Smoking status: Former Smoker -- 1.00 packs/day for 40 years    Types: Cigarettes    Quit date: 05/02/2002  . Smokeless tobacco: Never Used  . Alcohol Use: No   OB History   Grav Para Term Preterm Abortions TAB SAB Ect Mult Living                 Review of Systems ROS is negative unless otherwise stated in HPI  Allergies  Metoclopramide hcl; Penicillins; and Aspirin  Home Medications   Current Outpatient Rx  Name  Route  Sig  Dispense  Refill  . ALPRAZolam (XANAX) 0.25 MG tablet   Oral   Take 0.125 mg by mouth 4 (four) times daily as needed for anxiety.         Marland Kitchen  diltiazem (DILACOR XR) 180 MG 24 hr capsule   Oral   Take 180 mg by mouth 2 (two) times daily.         . furosemide (LASIX) 40 MG tablet   Oral   Take 40 mg by mouth daily.         Marland Kitchen HYDROcodone-acetaminophen (NORCO) 7.5-325 MG per tablet   Oral   Take 0.5-1 tablets by mouth every 4 (four) hours as needed for pain.         . metoprolol (LOPRESSOR) 50 MG tablet   Oral   Take 50 mg by mouth 2 (two) times daily.         . Multiple Vitamins-Minerals (CENTRUM SILVER PO)   Oral   Take 1 tablet by mouth daily.         . pantoprazole (PROTONIX) 40 MG tablet   Oral   Take 40 mg by mouth every morning.         .  potassium chloride SA (K-DUR,KLOR-CON) 20 MEQ tablet   Oral   Take 20 mEq by mouth daily.         . Rivaroxaban (XARELTO) 20 MG TABS   Oral   Take 20 mg by mouth daily with supper.         . traMADol (ULTRAM) 50 MG tablet   Oral   Take 50 mg by mouth every 6 (six) hours as needed for pain.          BP 179/91  Pulse 83  Temp(Src) 98 F (36.7 C) (Oral)  Resp 14  SpO2 100% Physical Exam  Nursing note and vitals reviewed. Constitutional: She is oriented to person, place, and time. She appears well-developed and well-nourished. No distress.  HENT:  Head: Normocephalic and atraumatic.  Eyes: Pupils are equal, round, and reactive to light.  Neck: Normal range of motion. Neck supple.  Cardiovascular: Normal rate and regular rhythm.   Pulmonary/Chest: Effort normal. No respiratory distress. She has no wheezes. She has no rales.  Abdominal: Soft. She exhibits no distension. There is no tenderness.  Neurological: She is alert and oriented to person, place, and time. She has normal strength. No cranial nerve deficit or sensory deficit. She displays a negative Romberg sign. GCS eye subscore is 4. GCS verbal subscore is 5. GCS motor subscore is 6.  Skin: Skin is warm and dry.    ED Course   Procedures (including critical care time)  Labs Reviewed  CBC WITH DIFFERENTIAL - Abnormal; Notable for the following:    MCH 34.7 (*)    Lymphocytes Relative 11 (*)    All other components within normal limits  COMPREHENSIVE METABOLIC PANEL - Abnormal; Notable for the following:    Potassium 3.1 (*)    Glucose, Bld 143 (*)    Albumin 3.3 (*)    GFR calc non Af Amer 80 (*)    All other components within normal limits  URINE CULTURE  URINALYSIS, ROUTINE W REFLEX MICROSCOPIC  TROPONIN I  OCCULT BLOOD X 1 CARD TO LAB, STOOL  OCCULT BLOOD, POC DEVICE  TYPE AND SCREEN  ABO/RH   Dg Chest 2 View  11/26/2012   *RADIOLOGY REPORT*  Clinical Data: Weakness and dizziness; hypertension  CHEST -  2 VIEW  Comparison: March 13, 2012  Findings: There is no edema or consolidation.  The heart size and pulmonary vascularity are normal.  No adenopathy.  There is a large hiatal type hernia.  There is atherosclerotic change in aorta.  There is advanced arthropathy in both shoulders.  IMPRESSION: Sizable hiatal type hernia.  No edema or consolidation.   Original Report Authenticated By: Bretta Bang, M.D.   Ct Head Wo Contrast  11/26/2012   *RADIOLOGY REPORT*  Clinical Data: Weakness and dizziness since last night.  Nausea and vomiting.  CT HEAD WITHOUT CONTRAST  Technique:  Contiguous axial images were obtained from the base of the skull through the vertex without contrast.  Comparison: MRI 11/10/2008.  Findings: Calvarium intact.  Intracranial atherosclerosis. No mass lesion, mass effect, midline shift, hydrocephalus, hemorrhage.  No acute territorial cortical ischemia/infarct. Atrophy and chronic ischemic white matter disease is present.  Tiny right occipital lacunar infarcts identified on prior MRI noted.  IMPRESSION: No interval change or acute intracranial abnormality.  Atrophy and chronic ischemic white matter disease. Tiny right occipital lacunar infarcts appear unchanged compared to prior.   Original Report Authenticated By: Andreas Newport, M.D.   1. TIA (transient ischemic attack)     MDM   Date: 11/26/2012  Rate: 78  Rhythm: sinus rhythm  QRS Axis: normal  Intervals: PR prolonged  ST/T Wave abnormalities: normal  Conduction Disutrbances:nonspecific intraventricular conduction delay  Narrative Interpretation:   Old EKG Reviewed: none available and unchanged    Work-up is unremarkable for acute abnormality. Dr. Bernette Mayers has seen patient as well and feels that she needs a brain MRI in the morning for r/o stroke.  Will admit to medicine.   Obs, Tele, [facility], Team [address] [place] 11/27/12 0008

## 2012-11-26 NOTE — ED Notes (Signed)
Patient presents from home via EMS reporting weakness and dizziness since last PM with nausea and vomiting. Patient denies diarrhea and fever. CBG 111 patient hypertensive PTA

## 2012-11-26 NOTE — ED Notes (Signed)
Pt family reports that pt has not eaten much today.

## 2012-11-26 NOTE — ED Notes (Signed)
Pt remains in radiology 

## 2012-11-26 NOTE — Telephone Encounter (Addendum)
Script for alprazolam waiting for signature then will be faxed to [facility] [phone]

## 2012-11-27 ENCOUNTER — Encounter (HOSPITAL_COMMUNITY): Payer: Self-pay | Admitting: Internal Medicine

## 2012-11-27 ENCOUNTER — Observation Stay (HOSPITAL_COMMUNITY): Payer: Medicare Other

## 2012-11-27 DIAGNOSIS — I4891 Unspecified atrial fibrillation: Secondary | ICD-10-CM

## 2012-11-27 DIAGNOSIS — I259 Chronic ischemic heart disease, unspecified: Secondary | ICD-10-CM | POA: Diagnosis not present

## 2012-11-27 DIAGNOSIS — F411 Generalized anxiety disorder: Secondary | ICD-10-CM

## 2012-11-27 DIAGNOSIS — D649 Anemia, unspecified: Secondary | ICD-10-CM | POA: Diagnosis not present

## 2012-11-27 DIAGNOSIS — K921 Melena: Secondary | ICD-10-CM

## 2012-11-27 DIAGNOSIS — G459 Transient cerebral ischemic attack, unspecified: Secondary | ICD-10-CM | POA: Diagnosis present

## 2012-11-27 DIAGNOSIS — I10 Essential (primary) hypertension: Secondary | ICD-10-CM

## 2012-11-27 DIAGNOSIS — I369 Nonrheumatic tricuspid valve disorder, unspecified: Secondary | ICD-10-CM | POA: Diagnosis not present

## 2012-11-27 DIAGNOSIS — I25810 Atherosclerosis of coronary artery bypass graft(s) without angina pectoris: Secondary | ICD-10-CM | POA: Diagnosis not present

## 2012-11-27 DIAGNOSIS — I6350 Cerebral infarction due to unspecified occlusion or stenosis of unspecified cerebral artery: Secondary | ICD-10-CM | POA: Diagnosis not present

## 2012-11-27 DIAGNOSIS — R42 Dizziness and giddiness: Secondary | ICD-10-CM | POA: Diagnosis present

## 2012-11-27 DIAGNOSIS — R7309 Other abnormal glucose: Secondary | ICD-10-CM

## 2012-11-27 LAB — GLUCOSE, CAPILLARY: Glucose-Capillary: 134 mg/dL — ABNORMAL HIGH (ref 70–99)

## 2012-11-27 LAB — LIPID PANEL
LDL Cholesterol: 217 mg/dL — ABNORMAL HIGH (ref 0–99)
Total CHOL/HDL Ratio: 4.3 RATIO
VLDL: 37 mg/dL (ref 0–40)

## 2012-11-27 MED ORDER — ACETAMINOPHEN 325 MG PO TABS: 650.0000 mg | ORAL_TABLET | ORAL | Status: DC | PRN

## 2012-11-27 MED ORDER — ONDANSETRON HCL 4 MG PO TABS: 4.0000 mg | ORAL_TABLET | Freq: Three times a day (TID) | ORAL | Status: DC | PRN

## 2012-11-27 MED ORDER — METOPROLOL TARTRATE 50 MG PO TABS
50.0000 mg | ORAL_TABLET | Freq: Two times a day (BID) | ORAL | Status: DC
  Administered 2012-11-27 – 2012-11-29 (×5): 50 mg via ORAL
  Filled 2012-11-27 (×7): qty 1

## 2012-11-27 MED ORDER — ACETAMINOPHEN 325 MG PO TABS: 650.0000 mg | ORAL_TABLET | Freq: Four times a day (QID) | ORAL | Status: DC | PRN

## 2012-11-27 MED ORDER — LORAZEPAM 2 MG/ML IJ SOLN
INTRAMUSCULAR | Status: AC
  Filled 2012-11-27: qty 1

## 2012-11-27 MED ORDER — RIVAROXABAN 20 MG PO TABS
20.0000 mg | ORAL_TABLET | Freq: Every day | ORAL | Status: DC
  Administered 2012-11-27: 20 mg via ORAL
  Filled 2012-11-27 (×2): qty 1

## 2012-11-27 MED ORDER — ONDANSETRON HCL 4 MG/2ML IJ SOLN: 4.0000 mg | Freq: Four times a day (QID) | INTRAMUSCULAR | Status: DC | PRN

## 2012-11-27 MED ORDER — ALPRAZOLAM 0.25 MG PO TABS
0.1250 mg | ORAL_TABLET | Freq: Four times a day (QID) | ORAL | Status: DC | PRN
  Administered 2012-11-27 – 2012-11-29 (×6): 0.125 mg via ORAL
  Filled 2012-11-27 (×6): qty 1

## 2012-11-27 MED ORDER — TRAMADOL HCL 50 MG PO TABS
50.0000 mg | ORAL_TABLET | Freq: Four times a day (QID) | ORAL | Status: DC | PRN
  Administered 2012-11-28: 50 mg via ORAL
  Filled 2012-11-27: qty 1

## 2012-11-27 MED ORDER — DILTIAZEM HCL ER 180 MG PO CP24
180.0000 mg | ORAL_CAPSULE | Freq: Two times a day (BID) | ORAL | Status: DC
  Administered 2012-11-27 – 2012-11-29 (×5): 180 mg via ORAL
  Filled 2012-11-27 (×7): qty 1

## 2012-11-27 MED ORDER — PANTOPRAZOLE SODIUM 40 MG PO TBEC
40.0000 mg | DELAYED_RELEASE_TABLET | Freq: Every morning | ORAL | Status: DC
  Administered 2012-11-27 – 2012-11-29 (×3): 40 mg via ORAL
  Filled 2012-11-27 (×2): qty 1

## 2012-11-27 MED ORDER — ONDANSETRON HCL 4 MG/2ML IJ SOLN
INTRAMUSCULAR | Status: AC
  Administered 2012-11-27: 16:00:00
  Filled 2012-11-27: qty 2

## 2012-11-27 MED ORDER — LORAZEPAM 2 MG/ML IJ SOLN
0.5000 mg | Freq: Once | INTRAMUSCULAR | Status: AC
  Administered 2012-11-27: 0.5 mg via INTRAVENOUS

## 2012-11-27 MED ORDER — INSULIN ASPART 100 UNIT/ML ~~LOC~~ SOLN: 0.0000 [IU] | Freq: Three times a day (TID) | SUBCUTANEOUS | Status: DC

## 2012-11-27 MED ORDER — INSULIN ASPART 100 UNIT/ML ~~LOC~~ SOLN: 0.0000 [IU] | Freq: Every day | SUBCUTANEOUS | Status: DC

## 2012-11-27 MED ORDER — HYDROCODONE-ACETAMINOPHEN 7.5-325 MG PO TABS
0.5000 | ORAL_TABLET | ORAL | Status: DC | PRN
  Administered 2012-11-27: 0.5 via ORAL
  Administered 2012-11-27 – 2012-11-29 (×5): 1 via ORAL
  Administered 2012-11-29: 0.5 via ORAL
  Filled 2012-11-27 (×7): qty 1

## 2012-11-27 MED ORDER — SODIUM CHLORIDE 0.9 % IJ SOLN
3.0000 mL | Freq: Two times a day (BID) | INTRAMUSCULAR | Status: DC
  Administered 2012-11-27 – 2012-11-28 (×4): 3 mL via INTRAVENOUS

## 2012-11-27 MED ORDER — SODIUM CHLORIDE 0.9 % IJ SOLN: 3.0000 mL | INTRAMUSCULAR | Status: DC | PRN

## 2012-11-27 MED ORDER — SODIUM CHLORIDE 0.9 % IV SOLN: 250.0000 mL | INTRAVENOUS | Status: DC | PRN

## 2012-11-27 NOTE — Progress Notes (Signed)
Subjective: Jacqueline [name] admitted for mental status changes with concern for recurrent CVA. She had become more confused, may have made mistakes with her meds and family was concerned. Initial eval was non-focal with no acute changes on CT. She is brought in for further evaluation.  Woke up from sleep. She doesn't recall the details of yesterday's events, doesn't recognize this examiner Objective: Lab:  Recent Labs  11/26/12 2037  WBC 7.2  NEUTROABS 5.6  HGB 15.0  HCT 42.3  MCV 97.9  PLT 226    Recent Labs  11/26/12 2037  NA 137  K 3.1*  CL 96  GLUCOSE 143*  BUN 21  CREATININE 0.65  CALCIUM 9.2    Imaging: CT brai - no acute changes  Scheduled Meds: . diltiazem  180 mg Oral BID  . insulin aspart  0-15 Units Subcutaneous TID WC  . insulin aspart  0-5 Units Subcutaneous QHS  . metoprolol  50 mg Oral BID  . pantoprazole  40 mg Oral q morning - 10a  . Rivaroxaban  20 mg Oral Q supper  . sodium chloride  3 mL Intravenous Q12H   Continuous Infusions:  PRN Meds:.sodium chloride, acetaminophen, ALPRAZolam, HYDROcodone-acetaminophen, sodium chloride, traMADol   Physical Exam: Filed Vitals:   11/27/12 0608  BP: 161/72  Pulse: 73  Temp: 98 F (36.7 C)  Resp: 20   Gen'l- elderly white woman, overweight HEENT- C&S clear Cor- IRIR controlled rate Pulm - no increased WOB Neuro - awakens easily, speech is clear, slow to form her thoughts with poor recall of recent events, doesn't recognize examiner. MAE.      Assessment/Plan: 1. Neuro - to r/o CVA - Plan Carotid dopplers, MRI brain  PT eval  2. GI - no active bleed but question of hemorrhoids. Hemoglobin robust Plan  rectal exam this PM  Will recheck H/H this PM  3. HTN- controlled  4. Anxiety - she is continued on her xanax. Have tried to change to SSRI in the past but she would not stay with the medication preferring xanax.  5. Hyperglycemia - not a diabetic Most recent two A1Cs very  normal. Plan Continue sliding scale for now  Repeat A1C.  Dispo - she was living alone. Will see how her eval turns out.  Congregate living has been suggested in the past.    Doroteo Glassman IM (o) [phone]; (c) [phone] Call-grp - Patsi Sears IM  Tele: [phone]  11/27/2012, 7:09 AM

## 2012-11-27 NOTE — Progress Notes (Signed)
Subjective: Awake and alert. Personnel officer. Able to immediately recall tele # of children  Objective: Lab:  Recent Labs  11/26/12 2037 11/27/12 1655  WBC 7.2  --   NEUTROABS 5.6  --   HGB 15.0 14.9  HCT 42.3 43.9  MCV 97.9  --   PLT 226  --     Recent Labs  11/26/12 2037  NA 137  K 3.1*  CL 96  GLUCOSE 143*  BUN 21  CREATININE 0.65  CALCIUM 9.2    Imaging: MRI brain 11/27/12 Comparison: CT 11/18/2012, MRI 11/10/2008  MRI HEAD  Findings: Acute infarct left posterior cerebellum measuring  approximately 2 cm. No other acute infarct.  Small chronic infarcts in the cerebellum bilaterally. Mild chronic  microvascular ischemic change in the cerebral white matter and  pons.  Negative for intracranial hemorrhage. Negative for mass or edema.  No midline shift. Ventricle size is normal. Cerebral volume is  normal for age.  IMPRESSION:  Acute infarct left mid cerebellum posteriorly.  Chronic ischemic changes as above. No hemorrhage or mass lesion  Identified.  11/27/12 MRA brain: Negative for aneurysm.  IMPRESSION:  Mild intracranial atherosclerotic disease without flow-limiting  stenosis or large vessel occlusion.  Carotid duplex - no significant occlusive disease  2 D echo 11/27/12: Study Conclusions  - Left ventricle: The cavity size was normal. Wall thickness was increased in a pattern of mild LVH. There was mild focal basal hypertrophy of the septum. Systolic function was normal. The estimated ejection fraction was in the range of 55% to 60%. Wall motion was normal; there were no regional wall motion abnormalities. - Aortic valve: Valve mobility was restricted. - Mitral valve: Moderately calcified annulus. - Left atrium: The atrium was mildly dilated. - Pulmonary arteries: PA peak pressure: 31mm Hg (S). - Pericardium, extracardiac: A trivial pericardial effusion was identified.   Scheduled Meds: . diltiazem  180 mg Oral BID  . insulin aspart  0-15  Units Subcutaneous TID WC  . insulin aspart  0-5 Units Subcutaneous QHS  . LORazepam      . metoprolol  50 mg Oral BID  . pantoprazole  40 mg Oral q morning - 10a  . Rivaroxaban  20 mg Oral Q supper  . sodium chloride  3 mL Intravenous Q12H   Continuous Infusions:  PRN Meds:.sodium chloride, acetaminophen, ALPRAZolam, HYDROcodone-acetaminophen, ondansetron (ZOFRAN) IV, ondansetron, sodium chloride, traMADol   Physical Exam: Filed Vitals:   11/27/12 1738  BP: 111/65  Pulse: 75  Temp: 97.7 F (36.5 C)  Resp: 18   No repeat physical exam     Assessment/Plan: 1. Neuro - new cerebellar CVA while on Xeralto. Plan  Neuro consult - re: what else can we do to prevent further events.  Spoke with dtr Alinda Money (c) [phone]   Illene Regulus [facility] (o) [phone]; (c) [phone] Call-grp - Pats[facility] IM  Tele: [phone]  11/27/2012, 6:10 PM

## 2012-11-27 NOTE — Progress Notes (Signed)
  Echocardiogram 2D Echocardiogram has been performed.  Arvil Chaco 11/27/2012, 11:10 AM

## 2012-11-27 NOTE — H&P (Signed)
[facility] History and Physical  Keily Lepp  WUJ:[id number]  DOB: [birth year]  DOA: 11/26/2012  Referring physician: Dr Bernette Mayers PCP: Illene Regulus, MD   Chief Complaint: dizziness  HPI: Jacqueline [name] is a 79 y.o. female with Past medical history of TIA, AFIB, on Rivaroxaban, hypertension, GI bleed, presented with the complain of dizziness that has been ongoing since last 2 days. She has been feeling tired and lethargic since last few weeks with worseing of her symptoms since last few days. She does have some bright red blood on her wipes but denies any obvious bloody bowel movements. Denies any vertigo or new FND at present. Pt denies any fever, chills, headache, nausea, vomiting, abdominal pain, diarrhea, constipation, chest pain, palpitation, orthopnea, PND, focal neurological deficit, burning urination, cough, shortness of breath.  Review of Systems: as mentioned in the history of present illness.  A Comprehensive review of the other systems is negative.  Past Medical History  Diagnosis Date  . Allergy, unspecified not elsewhere classified   . Other dyspnea and respiratory abnormality   . Other diseases of lung, not elsewhere classified   . Anemia, unspecified   . Unspecified adverse effect of other drug, medicinal and biological substance(995.29)   . Blood in stool   . Hemorrhage of gastrointestinal tract, unspecified   . Hemorrhage of gastrointestinal tract, unspecified   . Personal history of peptic ulcer disease   . Anxiety state, unspecified   . Personal history of other diseases of digestive system   . Unspecified essential hypertension   . Complication of anesthesia     "hard to wake me up after colonoscopy" (03/13/12)  . H/O hiatal hernia   . GERD (gastroesophageal reflux disease)   . Unspecified paranoid state   . Atrial fibrillation with rapid ventricular response 03/13/12  . Osteoarthrosis, unspecified whether generalized or localized, lower leg   .  Degeneration of lumbar or lumbosacral intervertebral disc   . Degeneration of lumbar or lumbosacral intervertebral disc   . Unspecified cerebral artery occlusion with cerebral infarction 10/2008    "mini stroke" denies residual (03/13/12)   Past Surgical History  Procedure Laterality Date  . Pilonidal cyst / sinus excision  1957  . Dilation and curettage of uterus  1950's?  . Shoulder arthroscopy  ~ 2000    right   Social History:  reports that she quit smoking about 10 years ago. Her smoking use included Cigarettes. She has a 40 pack-year smoking history. She has never used smokeless tobacco. She reports that she does not drink alcohol or use illicit drugs. Patient is coming from home. Patient can participate in ADLs.  Allergies  Allergen Reactions  . Metoclopramide Hcl Other (See Comments)    REACTION: Tremors  . Penicillins Itching  . Aspirin Other (See Comments)    "gave me stomach aches; made me bleed" (03/13/12)    Family History  Problem Relation Age of Onset  . Diabetes Mother   . Other Mother     cva  . Other Father     CVA    Prior to Admission medications   Medication Sig Start Date End Date Taking? Authorizing Provider  ALPRAZolam (XANAX) 0.25 MG tablet Take 0.125 mg by mouth 4 (four) times daily as needed for anxiety.   Yes Historical Provider, MD  diltiazem (DILACOR XR) 180 MG 24 hr capsule Take 180 mg by mouth 2 (two) times daily.   Yes Historical Provider, MD  furosemide (LASIX) 40 MG tablet Take 40 mg by mouth  daily.   Yes Historical Provider, MD  HYDROcodone-acetaminophen (NORCO) 7.5-325 MG per tablet Take 0.5-1 tablets by mouth every 4 (four) hours as needed for pain.   Yes Historical Provider, MD  metoprolol (LOPRESSOR) 50 MG tablet Take 50 mg by mouth 2 (two) times daily.   Yes Historical Provider, MD  Multiple Vitamins-Minerals (CENTRUM SILVER PO) Take 1 tablet by mouth daily.   Yes Historical Provider, MD  pantoprazole (PROTONIX) 40 MG tablet Take 40 mg  by mouth every morning.   Yes Historical Provider, MD  potassium chloride SA (K-DUR,KLOR-CON) 20 MEQ tablet Take 20 mEq by mouth daily.   Yes Historical Provider, MD  Rivaroxaban (XARELTO) 20 MG TABS Take 20 mg by mouth daily with supper.   Yes Historical Provider, MD  traMADol (ULTRAM) 50 MG tablet Take 50 mg by mouth every 6 (six) hours as needed for pain.   Yes Historical Provider, MD    Physical Exam: Filed Vitals:   11/26/12 2300 11/27/12 0000 11/27/12 0100 11/27/12 0608  BP: 179/91 162/92 144/94 161/72  Pulse: 83 81 71 73  Temp:   98.1 F (36.7 C) 98 F (36.7 C)  TempSrc:    Oral  Resp: 14  18 20   Height:    5' (1.524 m)  Weight:    69.355 kg (152 lb 14.4 oz)  SpO2: 100% 98% 100% 92%    General: Alert, Awake and Oriented to Time, Place and Person. Appear in mild distress, expressive difficulty in speach Eyes: PERRL ENT: Oral Mucosa clear moist. Neck: no JVD, no Carotid Bruits, no Stiffness Cardiovascular: S1 and S2 Present, Murmur present, Peripheral Pulses Present Respiratory: Clear to Auscultation, Bilateral Air entry equal and Decreased, Abdomen: Bowel Sound Present, Soft and Non tender,no Organomegaly Skin: no decubitus Ulcer,  Extremities: Pedal edema trace, calf tenderness no. Neurologic: Mental status, Motor strength, Sensation, reflexes, Proprioception Grossly Unremarkable.  Labs on Admission:  Basic Metabolic Panel:  Recent Labs Lab 11/26/12 2037  NA 137  K 3.1*  CL 96  CO2 29  GLUCOSE 143*  BUN 21  CREATININE 0.65  CALCIUM 9.2   Liver Function Tests:  Recent Labs Lab 11/26/12 2037  AST 25  ALT 28  ALKPHOS 70  BILITOT 0.4  PROT 6.4  ALBUMIN 3.3*   No results found for this basename: LIPASE, AMYLASE,  in the last 168 hours No results found for this basename: AMMONIA,  in the last 168 hours CBC:  Recent Labs Lab 11/26/12 2037  WBC 7.2  NEUTROABS 5.6  HGB 15.0  HCT 42.3  MCV 97.9  PLT 226   Cardiac Enzymes:  Recent Labs Lab  11/26/12 2037  TROPONINI <0.30    BNP (last 3 results)  Recent Labs  04/04/12 1025  PROBNP 88.0   CBG: No results found for this basename: GLUCAP,  in the last 168 hours  Radiological Exams on Admission: Dg Chest 2 View  11/26/2012   *RADIOLOGY REPORT*  Clinical Data: Weakness and dizziness; hypertension  CHEST - 2 VIEW  Comparison: March 13, 2012  Findings: There is no edema or consolidation.  The heart size and pulmonary vascularity are normal.  No adenopathy.  There is a large hiatal type hernia.  There is atherosclerotic change in aorta.  There is advanced arthropathy in both shoulders.  IMPRESSION: Sizable hiatal type hernia.  No edema or consolidation.   Original Report Authenticated By: Bretta Bang, M.D.   Ct Head Wo Contrast  11/26/2012   *RADIOLOGY REPORT*  Clinical Data: Weakness  and dizziness since last night.  Nausea and vomiting.  CT HEAD WITHOUT CONTRAST  Technique:  Contiguous axial images were obtained from the base of the skull through the vertex without contrast.  Comparison: MRI 11/10/2008.  Findings: Calvarium intact.  Intracranial atherosclerosis. No mass lesion, mass effect, midline shift, hydrocephalus, hemorrhage.  No acute territorial cortical ischemia/infarct. Atrophy and chronic ischemic white matter disease is present.  Tiny right occipital lacunar infarcts identified on prior MRI noted.  IMPRESSION: No interval change or acute intracranial abnormality.  Atrophy and chronic ischemic white matter disease. Tiny right occipital lacunar infarcts appear unchanged compared to prior.   Original Report Authenticated By: Andreas Newport, M.D.    EKG: Independently reviewed. NSR no afib seen today  Assessment/Plan Principal Problem:   TIA (transient ischemic attack) Active Problems:   GI BLEEDING   Atrial fibrillation with RVR   Dizziness and giddiness   1. TIA: Possible symptoms of TIA, pt had some tingling and numbness of both hands that has  resolved. More likely her symptoms reflect orthostasis. MRI ordered and tele. Recent TTE does not show ay significant abnormalities. Carotid duplex ordered.  2. Gi bleed from hemorrhoids. Bright red blood on the wipes. Monitor hemoglobin and if dropping will need further work up.  3. A fib with RVR Continue cardizem at present.  DVT Prophylaxis: already on rivaroxaban Nutrition: cardiac diet  Code Status: full    Author: Lynden Oxford, MD [facility] Pager: [phone] 11/27/2012 6:42 AM    If 7PM-7AM, please contact night-coverage [email or web address] Password [email or web address]

## 2012-11-27 NOTE — Evaluation (Signed)
Physical Therapy Evaluation Patient Details Name: Jacqueline [name] MRN: [id number] DOB: [birth year] Today's Date: 11/27/2012 Time: 7829-5621 PT Time Calculation (min): 21 min  PT Assessment / Plan / Recommendation History of Present Illness  Pt is an 79 y.o. female adm due to AMS, dizziness and nausea.   Clinical Impression  Pt with history of CVA, AFIB, HTN and was adm due to AMS and dizziness with concern for possible recurrent CVA. CT scan revealed no acute changes at this time. And evaluation reveals no focal deficits. Pt is limited in mobility and presents with balance deficits. C/o dizziness and nausea with activity.  Pt presents with limitations listed below and will benefit from skilled PT to address problem list. Initial recommendation would be home with HHPT and 24/7 (A) from family; if family is unable to (A) pt then pt would need SNF, pending rehab progress in acute setting. Pt is a fall risk.   PT Assessment  Patient needs continued PT services    Follow Up Recommendations  SNF;Home health PT;Supervision/Assistance - 24 hour    Does the patient have the potential to tolerate intense rehabilitation      Barriers to Discharge Decreased caregiver support pt lives alone    Equipment Recommendations  Other (comment) (TBD)    Recommendations for Other Services     Frequency Min 5X/week    Precautions / Restrictions Precautions Precautions: Fall Precaution Comments: fell 2 months ago  Restrictions Weight Bearing Restrictions: No   Pertinent Vitals/Pain BP supine: 133/68; BP sitting 153/81 BP standing 179/91; pt c/o nausea, dizziness and headache; RN notified.       Mobility  Bed Mobility Bed Mobility: Supine to Sit;Sitting - Scoot to Edge of Bed Supine to Sit: 5: Supervision;HOB flat;With rails Sitting - Scoot to Edge of Bed: 6: Modified independent (Device/Increase time);With rail Details for Bed Mobility Assistance: pt relies on hand rails and requires increased time for  bed mobility; pt asked for (A) but with max encouragement and vc's for hand placement can complete bed mobility without physical (A)  Transfers Transfers: Sit to Stand;Stand to Sit Sit to Stand: 4: Min guard;From bed Stand to Sit: 4: Min guard;To bed Details for Transfer Assistance: required min guard for safety; vc's for safety and sequencing;  pt with continuous fwd flex kyphotic posture in standing; narrow BOS in standing with ER LEs Ambulation/Gait Ambulation/Gait Assistance: 4: Min assist Ambulation Distance (Feet): 10 Feet Assistive device: 1 person hand held assist Ambulation/Gait Assistance Details: pt became nauseous and unable to increase amb distance; required HHA to steady; pt unsteady with gt; amb with continuous fwd flex posture; vc's for safety  Gait Pattern: Decreased stride length;Decreased trunk rotation;Narrow base of support;Trunk flexed (ER LEs) Gait velocity: decreased General Gait Details: decreased step height  Stairs: No Wheelchair Mobility Wheelchair Mobility: No Modified Rankin (Stroke Patients Only) Pre-Morbid Rankin Score: No significant disability Modified Rankin: Moderate disability    Exercises General Exercises - Lower Extremity Ankle Circles/Pumps: 10 reps;Both;Supine   PT Diagnosis: Difficulty walking;Acute pain  PT Problem List: Decreased activity tolerance;Decreased balance;Decreased mobility;Decreased safety awareness;Pain PT Treatment Interventions: DME instruction;Gait training;Functional mobility training;Therapeutic activities;Therapeutic exercise;Balance training;Neuromuscular re-education;Patient/family education     PT Goals(Current goals can be found in the care plan section) Acute Rehab PT Goals Patient Stated Goal: to not be dizzy PT Goal Formulation: With patient Time For Goal Achievement: 12/04/12 Potential to Achieve Goals: Good  Visit Information  Last PT Received On: 11/27/12 Assistance Needed: +1 History of Present Illness:  Pt  is an 79 y.o. female adm due to AMS, dizziness and nausea.        Prior Functioning  Home Living Family/patient expects to be discharged to:: Private residence Living Arrangements: Alone Available Help at Discharge: Available PRN/intermittently Type of Home: House Home Access: Level entry Home Layout: One level Home Equipment: Walker - 4 wheels;Grab bars - tub/shower Additional Comments: pt states she uses 4-wheeled walker at times Prior Function Level of Independence: Independent Comments: pt unable to take shower; has fear of falling; does bird baths Communication Communication: No difficulties    Cognition  Cognition Arousal/Alertness: Awake/alert Behavior During Therapy: WFL for tasks assessed/performed;Anxious Overall Cognitive Status: Within Functional Limits for tasks assessed    Extremity/Trunk Assessment Upper Extremity Assessment Upper Extremity Assessment: Defer to OT evaluation Lower Extremity Assessment Lower Extremity Assessment: Overall WFL for tasks assessed Cervical / Trunk Assessment Cervical / Trunk Assessment: Kyphotic   Balance Balance Balance Assessed: Yes Static Standing Balance Static Standing - Balance Support: Right upper extremity supported;During functional activity Static Standing - Level of Assistance: 4: Min assist  End of Session PT - End of Session Equipment Utilized During Treatment: Gait belt Activity Tolerance: Patient limited by fatigue;Patient limited by pain;Other (comment) (nausea) Patient left: in bed;with call bell/phone within reach;with bed alarm set Nurse Communication: Mobility status;Other (comment) (pt c/o headache; BP )  GP Functional Assessment Tool Used: clinical judgement  Functional Limitation: Mobility: Walking and moving around Mobility: Walking and Moving Around Current Status [phone]): At least 20 percent but less than 40 percent impaired, limited or restricted Mobility: Walking and Moving Around Goal Status  [phone]): 0 percent impaired, limited or restricted   Jacqueline [name], [place] [phone] 11/27/2012, 9:26 AM

## 2012-11-27 NOTE — Progress Notes (Signed)
Pt complaining of "room spinning", and feeling nauseated.  Resting quietly.  Jacqueline [name] [employer]

## 2012-11-27 NOTE — ED Provider Notes (Signed)
Medical screening examination/treatment/procedure(s) were conducted as a shared visit with non-physician practitioner(s) and myself.  I personally evaluated the patient during the encounter  Pt with vertiginous dizziness, history of stroke. Symptoms improved, plan observation for MRI in the AM.   Jacqueline B. Bernette Mayers, MD 11/27/12 1156

## 2012-11-27 NOTE — Consult Note (Signed)
Neurology Consultation Reason for Consult: Stroke Referring Physician: Rodman Pickle  CC: Stroke  History is obtained from: Patient  HPI: Jacqueline [name] is a 79 y.o. female who at least on awakening yesterday had some dizziness. This worsened around 4 PM prompting her to use her life alert to be brought to the emergency room. On arrival here, she was admitted intra-medicine for dizziness and had an evaluation including an MRI of her brain which did reveal a small medial cerebellar infarct on the left.  She denies adamantly missing doses of her meds  LKW: 7/27 prior to going to bed tpa given: no, outside of window    ROS: A 14 point ROS was performed and is negative except as noted in the HPI.  Past Medical History  Diagnosis Date  . Allergy, unspecified not elsewhere classified   . Other dyspnea and respiratory abnormality   . Other diseases of lung, not elsewhere classified   . Anemia, unspecified   . Unspecified adverse effect of other drug, medicinal and biological substance(995.29)   . Blood in stool   . Hemorrhage of gastrointestinal tract, unspecified   . Hemorrhage of gastrointestinal tract, unspecified   . Personal history of peptic ulcer disease   . Anxiety state, unspecified   . Personal history of other diseases of digestive system   . Unspecified essential hypertension   . Complication of anesthesia     "hard to wake me up after colonoscopy" (03/13/12)  . H/O hiatal hernia   . GERD (gastroesophageal reflux disease)   . Unspecified paranoid state   . Atrial fibrillation with rapid ventricular response 03/13/12  . Osteoarthrosis, unspecified whether generalized or localized, lower leg   . Degeneration of lumbar or lumbosacral intervertebral disc   . Degeneration of lumbar or lumbosacral intervertebral disc   . Unspecified cerebral artery occlusion with cerebral infarction 10/2008    "mini stroke" denies residual (03/13/12)    Family History: Mother-stroke at  5  Social History: Tob: Previous history  Exam: Current vital signs: BP 111/65  Pulse 75  Temp(Src) 97.7 F (36.5 C) (Oral)  Resp 18  Ht 5' (1.524 m)  Wt 69.355 kg (152 lb 14.4 oz)  BMI 29.86 kg/m2  SpO2 98% Vital signs in last 24 hours: Temp:  [97.7 F (36.5 C)-98.1 F (36.7 C)] 97.7 F (36.5 C) (07/29 1738) Pulse Rate:  [71-101] 75 (07/29 1738) Resp:  [14-25] 18 (07/29 1738) BP: (111-179)/(49-98) 111/65 mmHg (07/29 1738) SpO2:  [92 %-100 %] 98 % (07/29 1738) Weight:  [69.355 kg (152 lb 14.4 oz)] 69.355 kg (152 lb 14.4 oz) (07/29 1610)  General: In bed, NAD CV: Normal rate Mental Status: Patient is awake, alert, oriented to person, place, month, year, and situation. History is halting, but coherent She has latency of response, and seems to have some difficulty with word finding, but this is not definitely aphasia. No signs of neglect. Cranial Nerves: II: Visual Fields are full. Pupils are equal, round, and reactive to light.  Discs are difficult to visualize. III,IV, VI: EOMI without ptosis or diploplia.  V: Facial sensation is symmetric to temperature VII: Facial movement is symmetric.  VIII: hearing is intact to voice X: Uvula elevates symmetrically XI: Shoulder shrug is symmetric. XII: tongue is midline without atrophy or fasciculations.  Motor: Tone is normal. Bulk is normal. 5/5 strength was present in all four extremities.  Sensory: Sensation is symmetric to light touch and temperature in the arms and legs. Deep Tendon Reflexes: 2+ and symmetric  in the biceps and patellae.  Plantars: Toes are downgoing bilaterally.  Cerebellar: FNF WITH MILD PASSPOINTING ON THE LEFT, BUT NO TREMOR HKS are intact bilaterally Gait: Patient is able to sit unassisted, but when trying to stand she is unsteady and therefore no further gait testing was attempted.    I have reviewed labs in epic and the results pertinent to this consultation are: LDL 217 a1c 5.3   I have  reviewed the images obtained: MRI brain-distal midline cerebellar infarct in the left  Impression: 79 year old female with new cerebellar infarct. The location and appearance on imaging make me suspect that this is an embolus, but thrombotic disease cannot be excluded and her cholesterol should be addressed. She does not have a lot of other intracranial atherosclerosis, however. My suspicion is that this is embolic.  Though I'm not sure there's clear data supporting changing from one anticoagulant to another in the setting of failure, it may be reasonable to change to pradaxa or eliquis.   Recommendations: 1) no changes in anticoagulants for tonight given that she already received xarelto 2) would start statin for LDL greater than 100, will defer to her PCP for which statin to choose. 3) continue hypertension control 4) telemetry while an inpatient 5) PT, OT 6) stroke team will follow her in the morning.   Ritta Slot, MD [facility] [phone]  If 7pm- 7am, please page neurology on call at [phone].

## 2012-11-27 NOTE — Progress Notes (Signed)
VASCULAR LAB PRELIMINARY  PRELIMINARY  PRELIMINARY  PRELIMINARY  Carotid duplex completed.    Preliminary report:  Bilateral:  Less than 40% ICA stenosis.  Vertebral artery flow is antegrade.     Bronsyn Shappell, RVS 11/27/2012, 4:27 PM

## 2012-11-28 ENCOUNTER — Telehealth: Payer: Self-pay | Admitting: *Deleted

## 2012-11-28 DIAGNOSIS — I25810 Atherosclerosis of coronary artery bypass graft(s) without angina pectoris: Secondary | ICD-10-CM

## 2012-11-28 DIAGNOSIS — I6350 Cerebral infarction due to unspecified occlusion or stenosis of unspecified cerebral artery: Secondary | ICD-10-CM

## 2012-11-28 DIAGNOSIS — E785 Hyperlipidemia, unspecified: Secondary | ICD-10-CM

## 2012-11-28 DIAGNOSIS — F411 Generalized anxiety disorder: Secondary | ICD-10-CM | POA: Diagnosis not present

## 2012-11-28 DIAGNOSIS — G459 Transient cerebral ischemic attack, unspecified: Secondary | ICD-10-CM | POA: Diagnosis not present

## 2012-11-28 DIAGNOSIS — I4891 Unspecified atrial fibrillation: Secondary | ICD-10-CM | POA: Diagnosis not present

## 2012-11-28 LAB — GLUCOSE, CAPILLARY: Glucose-Capillary: 118 mg/dL — ABNORMAL HIGH (ref 70–99)

## 2012-11-28 LAB — URINE CULTURE

## 2012-11-28 MED ORDER — ROSUVASTATIN CALCIUM 5 MG PO TABS
5.0000 mg | ORAL_TABLET | Freq: Every day | ORAL | Status: DC
  Administered 2012-11-28: 5 mg via ORAL
  Filled 2012-11-28 (×2): qty 1

## 2012-11-28 MED ORDER — NON FORMULARY: 5.0000 mg | Freq: Every evening | Status: DC

## 2012-11-28 MED ORDER — RIVAROXABAN 15 MG PO TABS
15.0000 mg | ORAL_TABLET | Freq: Every day | ORAL | Status: DC
  Administered 2012-11-28: 15 mg via ORAL
  Filled 2012-11-28 (×2): qty 1

## 2012-11-28 NOTE — Evaluation (Signed)
Occupational Therapy Evaluation Patient Details Name: Jacqueline [name] MRN: [id number] DOB: 27-Feb-1929 Today's Date: 11/28/2012 Time: 4098-1191 OT Time Calculation (min): 38 min  OT Assessment / Plan / Recommendation History of present illness Pt is an 79 y.o. female adm due to AMS, dizziness and nausea.    Clinical Impression   Pt is a pleasant woman complaining of nausea and dizziness. Pt with minor confusion and would jump from topic to topic during conversation without finishing sentences. Pt currently with functional limitiations due to the deficits listed below (see OT problem list). Pt will benefit from skilled OT to increase their independence and safety with adls and balance to allow discharge SNF as Pt is not safe to be alone at home because of balance deficits.     OT Assessment  Patient needs continued OT Services    Follow Up Recommendations  SNF    Barriers to Discharge Decreased caregiver support (Pt lives alone with Gaynelle Cage, Children work full time)    Furniture conservator/restorer  Other (comment) (TBD)       Frequency  Min 2X/week    Precautions / Restrictions Precautions Precautions: Fall Precaution Comments: fell 2 months ago  Restrictions Weight Bearing Restrictions: No   Pertinent Vitals/Pain Pt reported no pain during session.    ADL  Eating/Feeding: Modified independent Where Assessed - Eating/Feeding: Edge of bed Grooming: Wash/dry hands;Min guard (putting on lotion) Where Assessed - Grooming: Unsupported sitting Upper Body Bathing: Supervision/safety Where Assessed - Upper Body Bathing: Unsupported sitting Lower Body Bathing: Moderate assistance Where Assessed - Lower Body Bathing: Unsupported sitting Upper Body Dressing: Supervision/safety Where Assessed - Upper Body Dressing: Unsupported sitting Lower Body Dressing: Moderate assistance Where Assessed - Lower Body Dressing: Unsupported sitting Toilet Transfer: Minimal assistance Toilet Transfer  Method: Sit to stand Toilet Transfer Equipment: Raised toilet seat with arms (or 3-in-1 over toilet) Toileting - Clothing Manipulation and Hygiene: Minimal assistance Where Assessed - Toileting Clothing Manipulation and Hygiene: Sit to stand from 3-in-1 or toilet Tub/Shower Transfer: Minimal assistance Tub/Shower Transfer Method: Ambulating Tub/Shower Transfer Equipment: Walk in shower;Grab bars Equipment Used: Gait belt Transfers/Ambulation Related to ADLs: Pt min assist for sit <> stand, and min assist for ambulation hand held assist. Pt only complained of dizziness with cueing.  ADL Comments: Pt ambulated to bathroom from bed for sink level grooming. Pt initiated grooming standing at sink level, but requested to sit down because "That is how I do it at home" to complete rest of BUE grooming tasks. Pt cued about dizziness and nausea, and Pt reported during ambulation, but not with head turns or sitting at sink. Pt reports not having taken showers at home due to fear of falling despite having a walk in shower, seat, and grab bars. Pt sponge bathes at sink.     OT Diagnosis:    OT Problem List: Decreased strength;Impaired balance (sitting and/or standing);Decreased knowledge of precautions OT Treatment Interventions: Self-care/ADL training;DME and/or AE instruction;Therapeutic activities;Patient/family education;Balance training   OT Goals(Current goals can be found in the care plan section) Acute Rehab OT Goals Patient Stated Goal: To get home to her parakeet/Parrot [name] OT Goal Formulation: With patient Time For Goal Achievement: 12/12/12 Potential to Achieve Goals: Good ADL Goals Pt Will Perform Grooming: with supervision;standing Pt Will Perform Tub/Shower Transfer: with supervision;ambulating;shower seat;grab bars;Shower transfer Additional ADL Goal #1: Pt will stand for 3 min maintaining balance during ADL.  Visit Information  Last OT Received On: 11/28/12 Assistance Needed:  +1 History of Present Illness: Pt is  an 79 y.o. female adm due to AMS, dizziness and nausea.        Prior Functioning     Home Living Family/patient expects to be discharged to:: Private residence Living Arrangements: Alone Available Help at Discharge: Available PRN/intermittently Type of Home: House Home Access: Level entry Home Layout: One level Home Equipment: Walker - 4 wheels;Grab bars - tub/shower;Shower seat Additional Comments: pt states she uses 4-wheeled walker at times, has a bird that she takes care of Prior Function Level of Independence: Independent Comments: Pt has not taken shower despite it being a walk in with a chair and grab bars. Fear of falling, been taking sponge baths at the sink.  Communication Communication: No difficulties Dominant Hand: Right         Vision/Perception Vision - History Baseline Vision: Wears glasses only for reading Patient Visual Report: No change from baseline Vision - Assessment Eye Alignment: Within Functional Limits Vision Assessment: Vision not tested Ocular Range of Motion: Within Functional Limits Alignment/Gaze Preference: Within Defined Limits Tracking/Visual Pursuits: Able to track stimulus in all quads without difficulty Saccades: Within functional limits Convergence: Within functional limits Visual Fields: No apparent deficits Additional Comments: all testing [facility]   Cognition  Cognition Arousal/Alertness: Awake/alert Behavior During Therapy: WFL for tasks assessed/performed;Anxious Overall Cognitive Status: No family/caregiver present to determine baseline cognitive functioning Memory: Decreased short-term memory    Extremity/Trunk Assessment Upper Extremity Assessment Upper Extremity Assessment: Generalized weakness Cervical / Trunk Assessment Cervical / Trunk Assessment: Kyphotic     Mobility Bed Mobility Bed Mobility: Supine to Sit;Sitting - Scoot to Edge of Bed Supine to Sit: 5: Supervision;HOB  flat;With rails Sitting - Scoot to Edge of Bed: 6: Modified independent (Device/Increase time);With rail Details for Bed Mobility Assistance: pt relies on hand rails and requires increased time for bed mobility; pt asked for (A) but with max encouragement and vc's for hand placement can complete bed mobility without physical (A)  Transfers Transfers: Sit to Stand;Stand to Sit Sit to Stand: 4: Min guard;From bed Stand to Sit: 4: Min guard;To bed Details for Transfer Assistance: required min guard for safety; vc's for safety and sequencing;  pt with continuous fwd flex kyphotic posture in standing Pt reports that "This is how I walk because of my shoulders and osteoporosis"           End of Session OT - End of Session Equipment Utilized During Treatment: Gait belt Activity Tolerance: Patient tolerated treatment well Patient left: in bed;with call bell/phone within reach Nurse Communication: Mobility status  GO     Sherryl Manges 11/28/2012, 10:18 AM

## 2012-11-28 NOTE — Progress Notes (Signed)
Stroke Team Progress Note  HISTORY [name] is a 79 y.o. female who at least on awakening yesterday 7/28 had some dizziness. She was last known well 7/27 prior to going to bed. This worsened around 4 PM prompting her to use her life alert to be brought to the emergency room. On arrival here, she was admitted intra-medicine for dizziness and had an evaluation including an MRI of her brain which did reveal a small medial cerebellar infarct on the left. She denies adamantly missing doses of her meds. Patient was not a TPA candidate secondary to delay in arrival. She was admitted for further evaluation and treatment.  SUBJECTIVE Her son is at the bedside.  Overall she feels her condition is stable.   OBJECTIVE Most recent Vital Signs: Filed Vitals:   11/27/12 2200 11/28/12 0200 11/28/12 0600 11/28/12 0953  BP: 135/87 160/89 134/90 130/85  Pulse: 85 84 87 75  Temp: 97.8 F (36.6 C) 98 F (36.7 C) 98.2 F (36.8 C) 98 F (36.7 C)  TempSrc: Oral Oral Oral Oral  Resp: 18 18 18 20   Height:      Weight:      SpO2: 97% 99% 99% 97%   CBG (last 3)   Recent Labs  11/27/12 1621 11/27/12 2055 11/28/12 0625  GLUCAP 139* 134* 101*    IV Fluid Intake:     MEDICATIONS  . diltiazem  180 mg Oral BID  . metoprolol  50 mg Oral BID  . pantoprazole  40 mg Oral q morning - 10a  . Rivaroxaban  20 mg Oral Q supper  . rosuvastatin  5 mg Oral q1800  . sodium chloride  3 mL Intravenous Q12H   PRN:  sodium chloride, acetaminophen, ALPRAZolam, HYDROcodone-acetaminophen, ondansetron (ZOFRAN) IV, ondansetron, sodium chloride, traMADol  Diet:     Activity:   Bathroom privileges with assistance DVT Prophylaxis:  xarelto  CLINICALLY SIGNIFICANT STUDIES Basic Metabolic Panel:  Recent Labs Lab 11/26/12 2037  NA 137  K 3.1*  CL 96  CO2 29  GLUCOSE 143*  BUN 21  CREATININE 0.65  CALCIUM 9.2   Liver Function Tests:  Recent Labs Lab 11/26/12 2037  AST 25  ALT 28  ALKPHOS 70  BILITOT 0.4   PROT 6.4  ALBUMIN 3.3*   CBC:  Recent Labs Lab 11/26/12 2037 11/27/12 1655  WBC 7.2  --   NEUTROABS 5.6  --   HGB 15.0 14.9  HCT 42.3 43.9  MCV 97.9  --   PLT 226  --    Coagulation: No results found for this basename: LABPROT, INR,  in the last 168 hours Cardiac Enzymes:  Recent Labs Lab 11/26/12 2037  TROPONINI <0.30   Urinalysis:  Recent Labs Lab 11/26/12 2157  COLORURINE YELLOW  LABSPEC 1.021  PHURINE 7.0  GLUCOSEU NEGATIVE  HGBUR NEGATIVE  BILIRUBINUR NEGATIVE  KETONESUR NEGATIVE  PROTEINUR NEGATIVE  UROBILINOGEN 1.0  NITRITE NEGATIVE  LEUKOCYTESUR NEGATIVE   Lipid Panel    Component Value Date/Time   CHOL 332* 11/27/2012 0545   TRIG 187* 11/27/2012 0545   HDL 78 11/27/2012 0545   CHOLHDL 4.3 11/27/2012 0545   VLDL 37 11/27/2012 0545   LDLCALC 217* 11/27/2012 0545   HgbA1C  Lab Results  Component Value Date   HGBA1C 5.3 11/27/2012    Urine Drug Screen:   No results found for this basename: labopia, cocainscrnur, labbenz, amphetmu, thcu, labbarb    Alcohol Level: No results found for this basename: ETH,  in the last  168 hours  CT of the brain  11/26/2012  No interval change or acute intracranial abnormality.  Atrophy and chronic ischemic white matter disease. Tiny right occipital lacunar infarcts appear unchanged compared to prior  MRI of the brain  11/27/2012   Acute infarct left mid cerebellum posteriorly.  Chronic ischemic changes as above.  No hemorrhage or mass lesion identified.    MRA of the brain  11/27/2012   Mild intracranial atherosclerotic disease without flow-limiting stenosis or large vessel occlusion.     2D Echocardiogram  EF 55-60% with no source of embolus.   Carotid Doppler  No evidence of hemodynamically significant internal carotid artery stenosis. Vertebral artery flow is antegrade.   CXR  11/26/2012   Sizable hiatal type hernia.  No edema or consolidation.   EKG  .   Therapy Recommendations skilled nursing facility    Physical Exam   Pleasant elderly caucasian talkative lady not in distress.Awake alert. Afebrile. Head is nontraumatic. Neck is supple without bruit. Hearing is normal. Cardiac exam no murmur or gallop. Lungs are clear to auscultation. Distal pulses are well felt. Neurological Exam ;  Awake  Alert oriented x 3. Normal speech and language.eye movements full without nystagmus.fundi were not visualized. Vision acuity and fields appear normal. Hearing is normal. Palatal movements are normal. Face symmetric. Tongue midline. Normal strength, tone, reflexes and coordination. Normal sensation. Gait deferred. ASSESSMENT Jacqueline [name] is a 79 y.o. female presenting with dizziness. Imaging confirms a left mid cerebellar infarct. Infarct felt to be  embolic secondary to known atrial fibrillation.  On xarelto prior to admission since Dec 2013 (had been on coumadin x 9 mos prior to that). Now on xarelto for secondary stroke prevention. Patient with resultant dizziness. Work up underway.  atrial fibrillation on xarelto PTA Hypertension Hyperlipidemia, LDL 217, on simvastatin 20 mg in past but stopped due to muscular pain, now crestor 5 mg no statin , goal LDL < 100 HgbA1c 5.3  Hospital day # 2  TREATMENT/PLAN  Continue xarelto for secondary stroke prevention. This is no research that supports changing from one NOAC to another. There is no evidence to add aspirin (plus, pt is intolerant due to GI issues)  Cancel TEE - Dr. Pearlean Brownie discussed with Dr. Debby Bud  Agree with trying crestor - can go to every other day dosing should muscle aches recur rather than stopping  PT to assess - will make recs for 24h care vs SNF. Doubt CIR will be needed.   Dr. Pearlean Brownie discussed diagnosis, prognosis,  treatment options and plan of care with patient and her son at the bedside.    Annie Main, MSN, RN, ANVP-BC, ANP-BC, [facility] Pager: [phone] 11/28/2012 10:35 AM  I have personally obtained a  history, examined the patient, evaluated imaging results, and formulated the assessment and plan of care. I agree with the above. Delia Heady, MD

## 2012-11-28 NOTE — Evaluation (Signed)
I agree with the following treatment note after reviewing documentation.   [name], Jacqueline Toomey Brynn   OTR/L Pager: [phone] Office: [phone] .   

## 2012-11-28 NOTE — Progress Notes (Signed)
Subjective: Jacqueline [name] reports taht she is feeling much better. She was able to get up to BR with assistance and felt much more steady on her feet. She has rested well. Her mother died at 78 of multiple strokes and she is concerned about herself.  Objective: Lab:  Recent Labs  11/26/12 2037 11/27/12 1655  WBC 7.2  --   NEUTROABS 5.6  --   HGB 15.0 14.9  HCT 42.3 43.9  MCV 97.9  --   PLT 226  --     Recent Labs  11/26/12 2037  NA 137  K 3.1*  CL 96  GLUCOSE 143*  BUN 21  CREATININE 0.65  CALCIUM 9.2   Lab Results  Component Value Date   CHOL 332* 11/27/2012   HDL 78 11/27/2012   LDLCALC 217* 11/27/2012   LDLDIRECT 159.0 05/26/2008   TRIG 187* 11/27/2012   CHOLHDL 4.3 11/27/2012     Imaging:  Scheduled Meds: . diltiazem  180 mg Oral BID  . insulin aspart  0-15 Units Subcutaneous TID WC  . insulin aspart  0-5 Units Subcutaneous QHS  . metoprolol  50 mg Oral BID  . pantoprazole  40 mg Oral q morning - 10a  . Rivaroxaban  20 mg Oral Q supper  . sodium chloride  3 mL Intravenous Q12H   Continuous Infusions:  PRN Meds:.sodium chloride, acetaminophen, ALPRAZolam, HYDROcodone-acetaminophen, ondansetron (ZOFRAN) IV, ondansetron, sodium chloride, traMADol   Physical Exam: Filed Vitals:   11/28/12 0600  BP: 134/90  Pulse: 87  Temp: 98.2 F (36.8 C)  Resp: 18   Gen'l - overweight white woman in no distress HEENT- C&S clear Cor- RRR at this exam, 2+ pulses PUlm - normal respirations Abd- obese, soft Neuro - Awake, alert, excellent recall. CN II-XII grossly normal. Cerebellar - no tremor, MAE. Did not stand or walk      Assessment/Plan: 1. Neuro - s/p cerebellar CVA. Appreciate neuro consult. I am not familiar with any literature favoring one anticoagulant over the other for stroke prevention  Plan Change in anticoagulant deferred to Neurology  Will order TEE r/o PFO  Continue PT/OT  2. GI - no sign of bleeding. Hgb 1/29 PM was stable  3. HTN adequate  control. Want to avoid hypotension  4. Anxiety - at her baseline and doing pretty well all things considered  Plan Continue present medications  5. Hyperglycemia -  Lab Results  Component Value Date   HGBA1C 5.3 11/27/2012   Will d/c sliding scale  6. Hyperlipidemia -    Jan '10  March '10 July '10 LDL        159    63    64   She had been on simvastatin 20mg  but d/c'd medication to complaint of muscular pain.  Plan crestor 5 mg daily  Follow up lab as outpatient with plan to advance as needed.  Dispo - she wants to go home with assistance. Talking with family about 24/7 care until she has clearly recovered.   Illene Regulus [facility] (o) [phone]; (c) [phone] Call-grp - Patsi Sears IM  Tele: [phone]  11/28/2012, 7:17 AM

## 2012-11-28 NOTE — Progress Notes (Signed)
Physical Therapy Treatment Patient Details Name: Jacqueline [name] MRN: [id number] DOB: 10-15-1928 Today's Date: 11/28/2012 Time: 8295-6213 PT Time Calculation (min): 23 min  PT Assessment / Plan / Recommendation  History of Present Illness Pt is an 79 y.o. female adm due to AMS, dizziness and nausea.    PT Comments   Pt is progressing with activity as evident in ability to increase ambulation distance but cont to recommend 24 hr assist/supervision at home vs ST-SNF.   Minor confusion noted throughout session however she was able to answer orientation questions appropriatly.      Follow Up Recommendations  SNF;Home health PT;Supervision/Assistance - 24 hour     Does the patient have the potential to tolerate intense rehabilitation     Barriers to Discharge        Equipment Recommendations  Other (comment) (TBD)    Recommendations for Other Services    Frequency Min 5X/week   Progress towards PT Goals    Plan Current plan remains appropriate    Precautions / Restrictions Precautions Precautions: Fall Precaution Comments: fell 2 months ago  Restrictions Weight Bearing Restrictions: No   Pertinent Vitals/Pain No pain reported.  C/o dizziness last 10' of ambulation but quickly resolved when seated.      Mobility  Bed Mobility Bed Mobility: Supine to Sit;Sitting - Scoot to Edge of Bed Supine to Sit: 6: Modified independent (Device/Increase time);HOB elevated Sitting - Scoot to Edge of Bed: 6: Modified independent (Device/Increase time) Transfers Transfers: Sit to Stand;Stand to Sit Sit to Stand: 4: Min guard;With upper extremity assist;From bed;From toilet Stand to Sit: 4: Min guard;With upper extremity assist;With armrests;To chair/3-in-1;To toilet Details for Transfer Assistance: cues for hand placement & technique.   Ambulation/Gait Ambulation/Gait Assistance: 4: Min assist Ambulation Distance (Feet): 120 Feet Assistive device: None;1 person hand held assist Ambulation/Gait  Assistance Details: (A) provided via HHA for stability initially but then pt able to progress to no UE support however did require HHA last 10' due to she c/o LE weakness & feeling dizzy.   Pt with shoulders rounded forwards/fwd flexed posture, minimal arm swing, & shuffling steps.   Gait Pattern: Step-through pattern;Decreased stride length;Shuffle;Trunk flexed;Narrow base of support Gait velocity: decreased General Gait Details: Pt may benefit from RW to increase safety Stairs: No Wheelchair Mobility Wheelchair Mobility: No Modified Rankin (Stroke Patients Only) Pre-Morbid Rankin Score: No significant disability Modified Rankin: Moderately severe disability    Exercises     PT Diagnosis:    PT Problem List:   PT Treatment Interventions:     PT Goals (current goals can now be found in the care plan section) Acute Rehab PT Goals Patient Stated Goal: To get home to her parakeet/Jacqueline [name] PT Goal Formulation: With patient Time For Goal Achievement: 12/04/12 Potential to Achieve Goals: Good  Visit Information  Last PT Received On: 11/28/12 Assistance Needed: +1 History of Present Illness: Pt is an 79 y.o. female adm due to AMS, dizziness and nausea.     Subjective Data  Patient Stated Goal: To get home to her parakeet/Jacqueline [name]   Cognition  Cognition Arousal/Alertness: Awake/alert Behavior During Therapy: WFL for tasks assessed/performed;Anxious Overall Cognitive Status: No family/caregiver present to determine baseline cognitive functioning Pt asking if Nsing station was a kitchen.  She also required cues to use toilet paper to clean herself after using bathroom & to drop paper in toilet after use.      Balance  Balance Balance Assessed: No  End of Session PT - End of  Session Equipment Utilized During Treatment: Gait belt Activity Tolerance: Patient tolerated treatment well Patient left: in chair;with call bell/phone within reach Nurse Communication: Mobility status    GP     Lara Mulch 11/28/2012, 2:25 PM   Verdell Face, PTA [phone] 11/28/2012

## 2012-11-28 NOTE — Progress Notes (Signed)
   CARE MANAGEMENT NOTE 11/28/2012  Patient:  Jacqueline [name], Jacqueline [name]   Account Number:  [id number]  Date Initiated:  11/28/2012  Documentation initiated by:  Jiles Crocker  Subjective/Objective Assessment:   ADMITTED WITH cerebellar CVA     Action/Plan:   PCP: Illene Regulus, MD  LIVES ALONE; CM FOLLOWING FOR DCP; / SHORT TERM SNF PLACEMENT   Anticipated DC Date:  12/05/2012   Anticipated DC Plan:  SKILLED NURSING FACILITY  In-house referral  Clinical Social Worker      DC Planning Services  CM consult          Status of service:  In process, will continue to follow Medicare Important Message given?  NA - LOS <3 / Initial given by admissions (If response is "NO", the following Medicare IM given date fields will be blank)  Per UR Regulation:  Reviewed for med. necessity/level of care/duration of stay  Comments:  11/28/2012- B Pamila Mendibles RN,BSN,MHA

## 2012-11-28 NOTE — Telephone Encounter (Signed)
Sheralyn Boatman called states pt fell over a chair approx. One month ago causing floaters in her eyes.  States it feels like her eye ball hurts,  Sheralyn Boatman wanted you to know this when consulting with the Neurologist.

## 2012-11-29 ENCOUNTER — Other Ambulatory Visit: Payer: Self-pay

## 2012-11-29 DIAGNOSIS — I4891 Unspecified atrial fibrillation: Secondary | ICD-10-CM | POA: Diagnosis not present

## 2012-11-29 DIAGNOSIS — I10 Essential (primary) hypertension: Secondary | ICD-10-CM | POA: Diagnosis not present

## 2012-11-29 DIAGNOSIS — I6350 Cerebral infarction due to unspecified occlusion or stenosis of unspecified cerebral artery: Secondary | ICD-10-CM | POA: Diagnosis not present

## 2012-11-29 DIAGNOSIS — F411 Generalized anxiety disorder: Secondary | ICD-10-CM | POA: Diagnosis not present

## 2012-11-29 MED ORDER — SIMVASTATIN 20 MG PO TABS: 20.0000 mg | ORAL_TABLET | Freq: Every evening | ORAL | Status: DC

## 2012-11-29 MED ORDER — RIVAROXABAN 15 MG PO TABS: 15.0000 mg | ORAL_TABLET | Freq: Every day | ORAL | Status: DC

## 2012-11-29 NOTE — Progress Notes (Signed)
Stroke Team Progress Note  HISTORY Jacqueline [name] is a 79 y.o. female who at least on awakening yesterday 7/28 had some dizziness. She was last known well 7/27 prior to going to bed. This worsened around 4 PM prompting her to use her life alert to be brought to the emergency room. On arrival here, she was admitted intra-medicine for dizziness and had an evaluation including an MRI of her brain which did reveal a small medial cerebellar infarct on the left. She denies adamantly missing doses of her meds. Patient was not a TPA candidate secondary to delay in arrival. She was admitted for further evaluation and treatment.  SUBJECTIVE Patient up in hall walking with therapy. OBJECTIVE Most recent Vital Signs: Filed Vitals:   11/28/12 2125 11/29/12 0219 11/29/12 0533 11/29/12 0904  BP: 135/83 142/67 149/82 173/87  Pulse: 91 55 82 84  Temp: 99.4 F (37.4 C) 98.4 F (36.9 C) 98.4 F (36.9 C) 98.6 F (37 C)  TempSrc: Oral Oral Oral Oral  Resp: 20 20 20 20   Height:      Weight:      SpO2: 100% 99% 99% 100%   CBG (last 3)   Recent Labs  11/28/12 0625 11/28/12 1146 11/28/12 1715  GLUCAP 101* 114* 118*    IV Fluid Intake:     MEDICATIONS  . diltiazem  180 mg Oral BID  . metoprolol  50 mg Oral BID  . pantoprazole  40 mg Oral q morning - 10a  . Rivaroxaban  15 mg Oral Q supper  . rosuvastatin  5 mg Oral q1800  . sodium chloride  3 mL Intravenous Q12H   PRN:  sodium chloride, acetaminophen, ALPRAZolam, HYDROcodone-acetaminophen, ondansetron (ZOFRAN) IV, ondansetron, sodium chloride, traMADol  Diet:  General thin liquids Activity:   Bathroom privileges with assistance DVT Prophylaxis:  xarelto  CLINICALLY SIGNIFICANT STUDIES Basic Metabolic Panel:   Recent Labs Lab 11/26/12 2037  NA 137  K 3.1*  CL 96  CO2 29  GLUCOSE 143*  BUN 21  CREATININE 0.65  CALCIUM 9.2   Liver Function Tests:   Recent Labs Lab 11/26/12 2037  AST 25  ALT 28  ALKPHOS 70  BILITOT 0.4  PROT 6.4   ALBUMIN 3.3*   CBC:   Recent Labs Lab 11/26/12 2037 11/27/12 1655  WBC 7.2  --   NEUTROABS 5.6  --   HGB 15.0 14.9  HCT 42.3 43.9  MCV 97.9  --   PLT 226  --    Coagulation: No results found for this basename: LABPROT, INR,  in the last 168 hours Cardiac Enzymes:   Recent Labs Lab 11/26/12 2037  TROPONINI <0.30   Urinalysis:   Recent Labs Lab 11/26/12 2157  COLORURINE YELLOW  LABSPEC 1.021  PHURINE 7.0  GLUCOSEU NEGATIVE  HGBUR NEGATIVE  BILIRUBINUR NEGATIVE  KETONESUR NEGATIVE  PROTEINUR NEGATIVE  UROBILINOGEN 1.0  NITRITE NEGATIVE  LEUKOCYTESUR NEGATIVE   Lipid Panel    Component Value Date/Time   CHOL 332* 11/27/2012 0545   TRIG 187* 11/27/2012 0545   HDL 78 11/27/2012 0545   CHOLHDL 4.3 11/27/2012 0545   VLDL 37 11/27/2012 0545   LDLCALC 217* 11/27/2012 0545   HgbA1C  Lab Results  Component Value Date   HGBA1C 5.3 11/27/2012    Urine Drug Screen:   No results found for this basename: labopia,  cocainscrnur,  labbenz,  amphetmu,  thcu,  labbarb    Alcohol Level: No results found for this basename: ETH,  in the  last 168 hours  CT of the brain  11/26/2012  No interval change or acute intracranial abnormality.  Atrophy and chronic ischemic white matter disease. Tiny right occipital lacunar infarcts appear unchanged compared to prior  MRI of the brain  11/27/2012   Acute infarct left mid cerebellum posteriorly.  Chronic ischemic changes as above.  No hemorrhage or mass lesion identified.    MRA of the brain  11/27/2012   Mild intracranial atherosclerotic disease without flow-limiting stenosis or large vessel occlusion.     2D Echocardiogram  EF 55-60% with no source of embolus.   Carotid Doppler  No evidence of hemodynamically significant internal carotid artery stenosis. Vertebral artery flow is antegrade.   CXR  11/26/2012   Sizable hiatal type hernia.  No edema or consolidation.   EKG  .   Therapy Recommendations skilled nursing facility    Physical Exam   Pleasant elderly caucasian talkative lady not in distress.Awake alert. Afebrile. Head is nontraumatic. Neck is supple without bruit. Hearing is normal. Cardiac exam no murmur or gallop. Lungs are clear to auscultation. Distal pulses are well felt. Neurological Exam ;  Awake  Alert oriented x 3. Normal speech and language.eye movements full without nystagmus.fundi were not visualized. Vision acuity and fields appear normal. Hearing is normal. Palatal movements are normal. Face symmetric. Tongue midline. Normal strength, tone, reflexes and coordination. Normal sensation. Gait deferred.  ASSESSMENT [name] is a 79 y.o. female presenting with dizziness. Imaging confirms a left mid cerebellar infarct. Infarct felt to be  embolic secondary to known atrial fibrillation.  On xarelto prior to admission since Dec 2013 (had been on coumadin x 9 mos prior to that). Now on xarelto for secondary stroke prevention. Patient with resultant dizziness. Work up completed.  atrial fibrillation on xarelto PTA Hypertension Hyperlipidemia, LDL 217, on simvastatin 20 mg in past but stopped due to muscular pain, now crestor 5 mg no statin , goal LDL < 100 HgbA1c 5.3  Hospital day # 3  TREATMENT/PLAN  Continue xarelto for secondary stroke prevention. This is no research that supports changing from one NOAC to another. There is no evidence to add aspirin (plus, pt is intolerant due to GI issues)  Agree with plans for 24h care, home health PT and OT an nursing No further stroke workup indicated. Patient has a 10-15% risk of having another stroke over the next year, the highest risk is within 2 weeks of the most recent stroke/TIA (risk of having a stroke following a stroke or TIA is the same). Ongoing risk factor control by Primary Care Physician Stroke Service will sign off. Please call should any needs arise. Follow up with Dr. Pearlean Brownie, Stroke Clinic, in 2 months.   Annie Main, MSN, RN,  ANVP-BC, ANP-BC, [facility] Pager: [phone] 11/29/2012 9:34 AM  I have personally obtained a history, examined the patient, evaluated imaging results, and formulated the assessment and plan of care. I agree with the above.  Delia Heady, MD

## 2012-11-29 NOTE — Progress Notes (Signed)
Physical Therapy Treatment Patient Details Name: Jacqueline [name] MRN: [id number] DOB: 1928/08/28 Today's Date: 11/29/2012 Time: 4098-1191 PT Time Calculation (min): 14 min  PT Assessment / Plan / Recommendation  History of Present Illness Pt is an 79 y.o. female adm due to AMS, dizziness and nausea.    PT Comments   Pt cont's to make progress with mobility.  Ambulated using RW today due to mild unsteadiness yesterday.  Pt also states she uses 4 wheeled RW when going out in community & at times "furniture walks" around home.  Pt reports family has planned for appropriate assist/supervision at home.     Follow Up Recommendations  Home health PT;Supervision/Assistance - 24 hour     Does the patient have the potential to tolerate intense rehabilitation     Barriers to Discharge        Equipment Recommendations       Recommendations for Other Services    Frequency Min 5X/week   Progress towards PT Goals Progress towards PT goals: Progressing toward goals  Plan Current plan remains appropriate    Precautions / Restrictions Precautions Precautions: Fall Precaution Comments: fell 2 months ago  Restrictions Weight Bearing Restrictions: No   Pertinent Vitals/Pain No pain reported.     Mobility  Bed Mobility Bed Mobility: Supine to Sit;Sitting - Scoot to Edge of Bed;Sit to Supine Supine to Sit: 6: Modified independent (Device/Increase time);HOB flat Sitting - Scoot to Edge of Bed: 6: Modified independent (Device/Increase time) Sit to Supine: 6: Modified independent (Device/Increase time);HOB flat Details for Bed Mobility Assistance: increased time for supine>sit Transfers Transfers: Sit to Stand;Stand to Sit Sit to Stand: 4: Min guard;With upper extremity assist;From bed Stand to Sit: 5: Supervision;With upper extremity assist;To bed Ambulation/Gait Ambulation/Gait Assistance: 4: Min guard Ambulation Distance (Feet): 140 Feet Assistive device: Rolling walker Ambulation/Gait  Assistance Details: Pt states she uses 4 wheeled RW for outside of house & can furniture & other surfaces for stability inside home.  Pt used RW today & demonstrated increased steadiness compared to previous PT session.  Cues for posture & to stay closer to walker.   Gait Pattern: Decreased stride length;Trunk flexed;Shuffle Gait velocity: decreased Stairs: No Wheelchair Mobility Wheelchair Mobility: No Modified Rankin (Stroke Patients Only) Pre-Morbid Rankin Score: No significant disability Modified Rankin: Moderately severe disability      PT Goals (current goals can now be found in the care plan section) Acute Rehab PT Goals Patient Stated Goal: To get home to her parakeet/Parrot [name] PT Goal Formulation: With patient Time For Goal Achievement: 12/04/12 Potential to Achieve Goals: Good  Visit Information  Last PT Received On: 11/29/12 Assistance Needed: +1 History of Present Illness: Pt is an 79 y.o. female adm due to AMS, dizziness and nausea.     Subjective Data  Patient Stated Goal: To get home to her parakeet/Parrot [name]   Cognition  Cognition Arousal/Alertness: Awake/alert Behavior During Therapy: WFL for tasks assessed/performed;Anxious Overall Cognitive Status: No family/caregiver present to determine baseline cognitive functioning    Balance  Balance Balance Assessed: Yes Static Standing Balance Static Standing - Balance Support: No upper extremity supported Static Standing - Level of Assistance: 5: Stand by assistance Static Standing - Comment/# of Minutes: able to stand with eyes closed x 30 secs.  bil UE support + mod assist to maintain balance for SLS.  Only able to hold SLS <5 secs each LE.    End of Session PT - End of Session Equipment Utilized During Treatment: Gait belt Activity Tolerance:  Patient tolerated treatment well Patient left: in bed;with call bell/phone within reach Nurse Communication: Mobility status   GP     Lara Mulch 11/29/2012, 10:45 AM  Verdell Face, PTA [phone] 11/29/2012

## 2012-11-29 NOTE — Discharge Summary (Signed)
Jacqueline [name], VANPELT NO.:  [id number]  MEDICAL RECORD NO.:  [id number]  LOCATION:  4N27C                        FACILITY:  [facility]  PHYSICIAN:  Rosalyn Gess. Byrd Terrero, MD  DATE OF BIRTH:  Feb 21, 1929  DATE OF ADMISSION:  11/26/2012 DATE OF DISCHARGE:  11/29/2012                              DISCHARGE SUMMARY   ADMITTING DIAGNOSIS:  CVA.  DISCHARGE DIAGNOSES: 1. Cerebellar CVA. 2. Hyperlipidemia. 3. History of GI bleed. 4. Atrial fibrillation with rapid ventricular response, controlled.  CONSULTANTS:  Dr. Pearlean Brownie for Neurology and Stroke, also Dr. Norvel Richards for [facility].  PROCEDURES:  CT of the head without contrast, dated admission November 26, 2012, which showed no interval change or acute intracranial abnormality. Atrophy and chronic ischemic white matter disease is noted.  Tiny right occipital lacunar infarct appears unchanged compared with previous exams.  Chest x-ray, on November 26, 2012, which showed a sizable hiatal hernia, otherwise no edema, consolidation, or other abnormality.  MRI of the brain performed on November 27, 2012, which revealed acute infarct left mid cerebellum posteriorly estimated at 2 cm in diameter. Chronic ischemic changes are noted.  No hemorrhage or mass lesion identified.  MRA revealing mild intracranial atherosclerotic disease without flow- limiting stenosis or large vessel occlusion.  HISTORY OF PRESENT ILLNESS:  [name] is an 79 year old woman with a history of TIA in 2010, atrial fibrillation, on Xarelto for prophylaxis, hypertension, history of GI bleed, who presented with a complaint of dizziness has been ongoing for the past 2 days.  Actually the patient has had some problem with dizziness and disequilibrium for several months.  On the day of admission, she had more fatigue and lethargy. She was noted by her family to have significant difficulty with balance and gait leading to her ER evaluation.  The patient also  replied reported bright red blood on the toilet tissue that bowel movement, but no frank GI bleeding otherwise noted.  The patient was seen initially in the emergency department.  A CT scan of the brain was unremarkable but because of her symptoms, she was admitted for evaluation.  Please see the H and P and other Epic records for past medical history, family history, social history.  Physical exam well documented in the admission note.  HOSPITAL COURSE: 1. Neuro.  Patient with positive finding for CVA in the cerebellum by     MRI.  MRA was negative.  The patient was seen by both PT and OT     during her hospitalization.  He recommended that she have 24 hour     supervision at home per the initial post discharge period of time.     She should have home PT and OT. The patient was seen in consultation by Dr. Petra Kuba, nocturnal hospital-neurologist as     well as the Stroke Service, Dr. Pearlean Brownie.  There was no recommendation     to change her anticoagulant.  Definitely need to bring her     hyperlipidemia under control.  It was felt she was otherwise stable     and was ready for discharge to home with supervision.  The patient     did  have additional studies while in hospital including the MRI and     MRA.  Carotid Doppler was performed, this revealed no significant     ICA stenosis.  A 2D echo was performed on November 27, 2012, which     revealed normal left ventricular cavity size with pattern of mild     LVH, mild focal basal hypertrophy of the septum.  EF estimated at     55-60% with normal wall motion.  No significant valvular disease     was noted.   The patient is to be discharged home.  Family has made arrangements     for her to have 24-hour supervision.  We will refer to home health     PT and OT. 2. Hyperlipidemia.  The patient has history of hyperlipidemia.  She     was on simvastatin 20 mg in 2010 with good results.  This was     somehow dropped from her medication list.  At  this time, her     cholesterol is exceedingly elevated with an LDL 217 with total     cholesterol 332.  When she was on simvastatin, her LDL was 64.     Plan simvastatin 20 mg p.o. daily.  Prescription sent to local     pharmacy and if she tolerates this at the time of followup visit,     we will send a long-term prescription to her mail order.  3. Hypertension.  Patient's blood pressure has been adequately     controlled with some permissiveness given her age and stroke     setting.  She will continue on her home medications.  4. GI.  The patient had no sign of active bleeding with no     hematochezia or hematemesis.  Suspect she had mild hemorrhoidal     Bleeding.  5. Anxiety.  Patient is doing well on her home medications, and this     will be continued.  DISCHARGE PHYSICAL EXAMINATION:  VITAL SIGNS:  Temperature was 98.4, blood pressure 149/82, heart rate 82, respirations 20, oxygen saturations 99%. GENERAL APPEARANCE:  This is an overweight Caucasian woman looking her stated age, in no acute distress. HEENT:  Conjunctiva and sclerae was clear. NECK:  Mild fat accumulation.  No deformity noted. CARDIOVASCULAR:  2+ radial pulse.  Her precordium was quiet.  She had an irregularly irregular heart beat.  No murmurs were appreciated. PULMONARY:  Patient has normal respirations with no increased work of breathing.  No rales, wheezes, or rhonchi. NEUROLOGIC:  Patient is awake, alert, she is oriented.  She is able to sit up without assistance.  By her report, she has been able to ambulate to the bathroom.  FINAL LABORATORY DATA:  Final chemistry panel from November 26, 2012, with sodium 137, potassium 3.1, chloride 96, CO2 of 29, BUN 21, creatinine 0.65, glucose was 143, which was unusual for her.  Liver functions were normal except for very mildly depressed albumin at 3.3.  The patient had troponin at the time of admission, which was negative.  Lipid profile revealed cholesterol of  332, triglycerides 187, HDL of 78, LDL was 217. Final H and H from November 27, 2012, with hemoglobin of 14.9 g, hematocrit 43.9%.  The patient had an A1c on November 27, 2012, which was 5.3% and normal.  DISCHARGE MEDICATIONS: 1. Tylenol on a p.r.n. basis. 2. Alprazolam 0.125 mg 4 times a day meds at home. 3. Diltiazem 180 mg daily. 4. Furosemide 40 mg daily.  5. Norco 7.5/325, 1/2 to 1 tablet every 4 hours as needed. 6. Metoprolol 50 mg b.i.d. 7. Centrum Silver daily. 8. Protonix 40 mg q.a.m. 9. Potassium 20 mEq daily. 10.Xarelto 15 mg daily due to renal function, new prescription to be     developed. 11.Simvastatin 20 mg daily, short-term prescription provided. 12.Tramadol on a p.r.n. basis.  DISPOSITION:  The patient is to be discharged home.  Family is aware and will provide supervision and assistance.  She will be referred to home health for PT, OT, and nursing followup.  The patient will be seen in the office for followup within 7-10 days.  The patient's condition at time of discharge dictation is stable, although somewhat guarded, given her age and multiple medical problems.     Rosalyn Gess Krysia Zahradnik, MD     MEN/MEDQ  D:  11/29/2012  T:  11/29/2012  Job:  [id number]

## 2012-11-29 NOTE — Progress Notes (Signed)
I agree with the following treatment note after reviewing documentation.   Johnston, Deforrest Bogle Brynn   OTR/L Pager: [phone] Office: [phone] .   

## 2012-11-29 NOTE — Progress Notes (Signed)
Subjective: Feeling good and ready to go home  Objective: Lab:  Recent Labs  11/26/12 2037 11/27/12 1655  WBC 7.2  --   NEUTROABS 5.6  --   HGB 15.0 14.9  HCT 42.3 43.9  MCV 97.9  --   PLT 226  --     Recent Labs  11/26/12 2037  NA 137  K 3.1*  CL 96  GLUCOSE 143*  BUN 21  CREATININE 0.65  CALCIUM 9.2    Imaging:  Scheduled Meds: . diltiazem  180 mg Oral BID  . metoprolol  50 mg Oral BID  . pantoprazole  40 mg Oral q morning - 10a  . Rivaroxaban  15 mg Oral Q supper  . rosuvastatin  5 mg Oral q1800  . sodium chloride  3 mL Intravenous Q12H   Continuous Infusions:  PRN Meds:.sodium chloride, acetaminophen, ALPRAZolam, HYDROcodone-acetaminophen, ondansetron (ZOFRAN) IV, ondansetron, sodium chloride, traMADol   Physical Exam: Filed Vitals:   11/29/12 0533  BP: 149/82  Pulse: 82  Temp: 98.4 F (36.9 C)  Resp: 20   See d/c/ summary     Assessment/Plan: For d/c home after CVA H/H face -to- face completed Dictated # [id number]   Illene Regulus [facility] (o) [phone]; (c) [phone] Call-grp - [facility]  Tele: [phone]  11/29/2012, 7:23 AM

## 2012-11-29 NOTE — Progress Notes (Signed)
Occupational Therapy Treatment Patient Details Name: Jacqueline [name] MRN: [id number] DOB: [birth year] Today's Date: 11/29/2012 Time: 4098-1191 OT Time Calculation (min): 24 min  OT Assessment / Plan / Recommendation  History of present illness Pt is an 79 y.o. female adm due to AMS, dizziness and nausea.    OT comments  Pt progressing towards goals. Pt with much clearer train of thought and cognition today. Pt still with balance deficits, and Pt educated on importance of using RW during ambulation between functional tasks. Pt continues to benefit from skilled OT in the acute setting before d/c home with 24 hour supervision and HHOT.   Follow Up Recommendations  Home health OT             Frequency Min 2X/week   Progress towards OT Goals Progress towards OT goals: Progressing toward goals  Plan Discharge plan needs to be updated    Precautions / Restrictions Precautions Precautions: Fall Precaution Comments: fell 2 months ago  Restrictions Weight Bearing Restrictions: No   Pertinent Vitals/Pain Pt did not report pain during session today.    ADL  Grooming: Wash/dry hands;Wash/dry face;Brushing hair Where Assessed - Grooming: Supported sitting Toilet Transfer: Minimal Dentist Method: Sit to Barista: Raised toilet seat with arms (or 3-in-1 over toilet) Toileting - Clothing Manipulation and Hygiene: Minimal assistance Where Assessed - Toileting Clothing Manipulation and Hygiene: Sit to stand from 3-in-1 or toilet Equipment Used: Gait belt Transfers/Ambulation Related to ADLs: Pt min assist for sit <> stand, and min assist for ambulation with RW.  ADL Comments: Pt ambulated with RW from bed to bathroom for sink level ADL and toileting. Pt able to perform peri care at supervision. Pt able to complete hand washing and brushing hair maintaining standing at sink level. Pt agreeable to sit up in chair for lunch.       OT Goals(current goals can  now be found in the care plan section) Acute Rehab OT Goals Patient Stated Goal: To get home to her parakeet/Parrot Jacqueline [name] OT Goal Formulation: With patient Time For Goal Achievement: 12/12/12 Potential to Achieve Goals: Good ADL Goals Pt Will Perform Grooming: with supervision;standing Pt Will Perform Tub/Shower Transfer: with supervision;ambulating;shower seat;grab bars;Shower transfer Additional ADL Goal #1: Pt will stand for 3 min maintaining balance during ADL.  Visit Information  Last OT Received On: 11/29/12 Assistance Needed: +1 History of Present Illness: Pt is an 79 y.o. female adm due to AMS, dizziness and nausea.           Cognition  Cognition Arousal/Alertness: Awake/alert Behavior During Therapy: WFL for tasks assessed/performed;Anxious Overall Cognitive Status: Within Functional Limits for tasks assessed    Mobility  Bed Mobility Bed Mobility: Supine to Sit;Sitting - Scoot to Edge of Bed;Sit to Supine Supine to Sit: 6: Modified independent (Device/Increase time);HOB flat Sitting - Scoot to Edge of Bed: 6: Modified independent (Device/Increase time) Sit to Supine: 6: Modified independent (Device/Increase time);HOB flat Details for Bed Mobility Assistance: increased time for supine>sit Transfers Transfers: Sit to Stand;Stand to Sit Sit to Stand: With upper extremity assist;From bed;4: Min assist;From toilet Stand to Sit: With upper extremity assist;4: Min guard;To chair/3-in-1;To toilet Details for Transfer Assistance: Pt with vc's for safe hand placement and sequencing. min assist for power up       Balance Balance Balance Assessed: Yes Static Standing Balance Static Standing - Balance Support: Bilateral upper extremity supported Static Standing - Level of Assistance: 5: Stand by assistance Static Standing - Comment/# of Minutes:  Standing at sink for BUE grooming tasks. Pt leaning with BUE resting on sink surface   End of Session OT - End of  Session Equipment Utilized During Treatment: Gait belt;Rolling walker Activity Tolerance: Patient tolerated treatment well Patient left: in chair;with call bell/phone within reach Nurse Communication: Mobility status  GO     Sherryl Manges 11/29/2012, 1:32 PM

## 2012-11-30 ENCOUNTER — Telehealth: Payer: Self-pay | Admitting: General Practice

## 2012-11-30 DIAGNOSIS — E669 Obesity, unspecified: Secondary | ICD-10-CM | POA: Diagnosis not present

## 2012-11-30 DIAGNOSIS — F411 Generalized anxiety disorder: Secondary | ICD-10-CM | POA: Diagnosis not present

## 2012-11-30 DIAGNOSIS — I10 Essential (primary) hypertension: Secondary | ICD-10-CM | POA: Diagnosis not present

## 2012-11-30 DIAGNOSIS — I4891 Unspecified atrial fibrillation: Secondary | ICD-10-CM | POA: Diagnosis not present

## 2012-11-30 DIAGNOSIS — I69998 Other sequelae following unspecified cerebrovascular disease: Secondary | ICD-10-CM | POA: Diagnosis not present

## 2012-11-30 DIAGNOSIS — R279 Unspecified lack of coordination: Secondary | ICD-10-CM | POA: Diagnosis not present

## 2012-11-30 DIAGNOSIS — G8929 Other chronic pain: Secondary | ICD-10-CM | POA: Diagnosis not present

## 2012-11-30 NOTE — Telephone Encounter (Signed)
Transitional Care Call:  Patient discharged on 11/29/2012.  Discharge diagnosis:  Cerebellar CVA.  Spoke with patient.  Patient states that she is doing very well.  Pt denies questions about hospital dc instructions.  RN reviewed medications with patient ie Xarelto.  Patient states that she has all medication in the home.  Physical therapy will be going to the home this afternoon.  Patient does live alone but has one of her children staying with her around the clock.  Patient has f/u appointment with Dr. Debby Bud on 12/04/2012 @ 3:30.

## 2012-12-01 DIAGNOSIS — R279 Unspecified lack of coordination: Secondary | ICD-10-CM | POA: Diagnosis not present

## 2012-12-01 DIAGNOSIS — I10 Essential (primary) hypertension: Secondary | ICD-10-CM | POA: Diagnosis not present

## 2012-12-01 DIAGNOSIS — G8929 Other chronic pain: Secondary | ICD-10-CM | POA: Diagnosis not present

## 2012-12-01 DIAGNOSIS — I69998 Other sequelae following unspecified cerebrovascular disease: Secondary | ICD-10-CM | POA: Diagnosis not present

## 2012-12-01 DIAGNOSIS — I4891 Unspecified atrial fibrillation: Secondary | ICD-10-CM | POA: Diagnosis not present

## 2012-12-01 DIAGNOSIS — F411 Generalized anxiety disorder: Secondary | ICD-10-CM | POA: Diagnosis not present

## 2012-12-03 ENCOUNTER — Other Ambulatory Visit: Payer: Self-pay | Admitting: Internal Medicine

## 2012-12-04 ENCOUNTER — Encounter: Payer: Self-pay | Admitting: Internal Medicine

## 2012-12-04 ENCOUNTER — Other Ambulatory Visit: Payer: Self-pay | Admitting: *Deleted

## 2012-12-04 ENCOUNTER — Ambulatory Visit (INDEPENDENT_AMBULATORY_CARE_PROVIDER_SITE_OTHER): Payer: Medicare Other | Admitting: Internal Medicine

## 2012-12-04 VITALS — BP 170/104 | HR 85 | Temp 98.3°F | Wt 153.4 lb

## 2012-12-04 DIAGNOSIS — F411 Generalized anxiety disorder: Secondary | ICD-10-CM

## 2012-12-04 DIAGNOSIS — I10 Essential (primary) hypertension: Secondary | ICD-10-CM

## 2012-12-04 DIAGNOSIS — I480 Paroxysmal atrial fibrillation: Secondary | ICD-10-CM

## 2012-12-04 DIAGNOSIS — I639 Cerebral infarction, unspecified: Secondary | ICD-10-CM

## 2012-12-04 MED ORDER — CLONIDINE HCL 0.1 MG PO TABS: 0.1000 mg | ORAL_TABLET | ORAL | Status: DC | PRN

## 2012-12-04 MED ORDER — METOPROLOL TARTRATE 50 MG PO TABS: 50.0000 mg | ORAL_TABLET | Freq: Two times a day (BID) | ORAL | Status: DC

## 2012-12-04 NOTE — Patient Instructions (Addendum)
Good to see you. You will need assistance until such time as the loss of balance is better, to the point where you are not at risk for falls.  Stroke - you had a cerebellar stroke that affects balance. The Meclizine is unlikely to have much of an effect on this Plan Risk reduction:  Good blood pressure control - check a couple of times a day. For systolic (top) pressure greater than 160 or the diastolic (bottom) greater than 100 take a clonidine 0.1 mg to bring the pressure down. YOu should be sitting or laying when you take it.  Get cholesterol under control: low fat diet and simvastatin 20 mg once every PM  Continue the Xeralto at 15 mg once a day.  Come back to see me in 1 month or sooner as needed.     Fat and Cholesterol Control Diet Cholesterol levels in your body are determined significantly by your diet. Cholesterol levels may also be related to heart disease. The following material helps to explain this relationship and discusses what you can do to help keep your heart healthy. Not all cholesterol is bad. Low-density lipoprotein (LDL) cholesterol is the "bad" cholesterol. It may cause fatty deposits to build up inside your arteries. High-density lipoprotein (HDL) cholesterol is "good." It helps to remove the "bad" LDL cholesterol from your blood. Cholesterol is a very important risk factor for heart disease. Other risk factors are high blood pressure, smoking, stress, heredity, and weight. The heart muscle gets its supply of blood through the coronary arteries. If your LDL cholesterol is high and your HDL cholesterol is low, you are at risk for having fatty deposits build up in your coronary arteries. This leaves less room through which blood can flow. Without sufficient blood and oxygen, the heart muscle cannot function properly and you may feel chest pains (angina pectoris). When a coronary artery closes up entirely, a part of the heart muscle may die causing a heart attack (myocardial  infarction). CHECKING CHOLESTEROL When your caregiver sends your blood to a lab to be examined for cholesterol, a complete lipid (fat) profile may be done. With this test, the total amount of cholesterol and levels of LDL and HDL are determined. Triglycerides are a type of fat that circulates in the blood. They can also be used to determine heart disease risk. The list below describes what the numbers should be: Test: Total Cholesterol.  Less than 200 mg/dl. Test: LDL "bad cholesterol."  Less than 100 mg/dl.  Less than 70 mg/dl if you are at very high risk of a heart attack or sudden cardiac death. Test: HDL "good cholesterol."  Greater than 50 mg/dl for women.  Greater than 40 mg/dl for men. Test: Triglycerides.  Less than 150 mg/dl. CONTROLLING CHOLESTEROL WITH DIET Although exercise and lifestyle factors are important, your diet is key. That is because certain foods are known to raise cholesterol and others to lower it. The goal is to balance foods for their effect on cholesterol and more importantly, to replace saturated and trans fat with other types of fat, such as monounsaturated fat, polyunsaturated fat, and omega-3 fatty acids. On average, a person should consume no more than 15 to 17 g of saturated fat daily. Saturated and trans fats are considered "bad" fats, and they will raise LDL cholesterol. Saturated fats are primarily found in animal products such as meats, butter, and cream. However, that does not mean you need to give up all your favorite foods. Today, there are good  tasting, low-fat, low-cholesterol substitutes for most of the things you like to eat. Choose low-fat or nonfat alternatives. Choose round or loin cuts of red meat. These types of cuts are lowest in fat and cholesterol. Chicken (without the skin), fish, veal, and ground [place] breast are great choices. Eliminate fatty meats, such as hot dogs and salami. Even shellfish have little or no saturated fat. Have a 3 oz  (85 g) portion when you eat lean meat, poultry, or fish. Trans fats are also called "partially hydrogenated oils." They are oils that have been scientifically manipulated so that they are solid at room temperature resulting in a longer shelf life and improved taste and texture of foods in which they are added. Trans fats are found in stick margarine, some tub margarines, cookies, crackers, and baked goods.  When baking and cooking, oils are a great substitute for butter. The monounsaturated oils are especially beneficial since it is believed they lower LDL and raise HDL. The oils you should avoid entirely are saturated tropical oils, such as coconut and palm.  Remember to eat a lot from food groups that are naturally free of saturated and trans fat, including fish, fruit, vegetables, beans, grains (barley, rice, couscous, bulgur wheat), and pasta (without cream sauces).  IDENTIFYING FOODS THAT LOWER CHOLESTEROL  Soluble fiber may lower your cholesterol. This type of fiber is found in fruits such as apples, vegetables such as broccoli, potatoes, and carrots, legumes such as beans, peas, and lentils, and grains such as barley. Foods fortified with plant sterols (phytosterol) may also lower cholesterol. You should eat at least 2 g per day of these foods for a cholesterol lowering effect.  Read package labels to identify low-saturated fats, trans fat free, and low-fat foods at the supermarket. Select cheeses that have only 2 to 3 g saturated fat per ounce. Use a heart-healthy tub margarine that is free of trans fats or partially hydrogenated oil. When buying baked goods (cookies, crackers), avoid partially hydrogenated oils. Breads and muffins should be made from whole grains (whole-wheat or whole oat flour, instead of "flour" or "enriched flour"). Buy non-creamy canned soups with reduced salt and no added fats.  FOOD PREPARATION TECHNIQUES  Never deep-fry. If you must fry, either stir-fry, which uses very  little fat, or use non-stick cooking sprays. When possible, broil, bake, or roast meats, and steam vegetables. Instead of putting butter or margarine on vegetables, use lemon and herbs, applesauce, and cinnamon (for squash and sweet potatoes), nonfat yogurt, salsa, and low-fat dressings for salads.  LOW-SATURATED FAT / LOW-FAT FOOD SUBSTITUTES Meats / Saturated Fat (g)  Avoid: Steak, marbled (3 oz/85 g) / 11 g  Choose: Steak, lean (3 oz/85 g) / 4 g  Avoid: Hamburger (3 oz/85 g) / 7 g  Choose: Hamburger, lean (3 oz/85 g) / 5 g  Avoid: Ham (3 oz/85 g) / 6 g  Choose: Ham, lean cut (3 oz/85 g) / 2.4 g  Avoid: Chicken, with skin, dark meat (3 oz/85 g) / 4 g  Choose: Chicken, skin removed, dark meat (3 oz/85 g) / 2 g  Avoid: Chicken, with skin, light meat (3 oz/85 g) / 2.5 g  Choose: Chicken, skin removed, light meat (3 oz/85 g) / 1 g Dairy / Saturated Fat (g)  Avoid: Whole milk (1 cup) / 5 g  Choose: Low-fat milk, 2% (1 cup) / 3 g  Choose: Low-fat milk, 1% (1 cup) / 1.5 g  Choose: Skim milk (1 cup) / 0.3 g  Avoid: Hard cheese (1 oz/28 g) / 6 g  Choose: Skim milk cheese (1 oz/28 g) / 2 to 3 g  Avoid: Cottage cheese, 4% fat (1 cup) / 6.5 g  Choose: Low-fat cottage cheese, 1% fat (1 cup) / 1.5 g  Avoid: Ice cream (1 cup) / 9 g  Choose: Sherbet (1 cup) / 2.5 g  Choose: Nonfat frozen yogurt (1 cup) / 0.3 g  Choose: Frozen fruit bar / trace  Avoid: Whipped cream (1 tbs) / 3.5 g  Choose: Nondairy whipped topping (1 tbs) / 1 g Condiments / Saturated Fat (g)  Avoid: Mayonnaise (1 tbs) / 2 g  Choose: Low-fat mayonnaise (1 tbs) / 1 g  Avoid: Butter (1 tbs) / 7 g  Choose: Extra light margarine (1 tbs) / 1 g  Avoid: Coconut oil (1 tbs) / 11.8 g  Choose: Olive oil (1 tbs) / 1.8 g  Choose: Corn oil (1 tbs) / 1.7 g  Choose: Safflower oil (1 tbs) / 1.2 g  Choose: Sunflower oil (1 tbs) / 1.4 g  Choose: Soybean oil (1 tbs) / 2.4 g  Choose: Canola oil (1 tbs) / 1  g Document Released: 04/18/2005 Document Revised: 07/11/2011 Document Reviewed: 10/07/2010 [employer] Patient Information 2014 [place], [place].

## 2012-12-04 NOTE — Progress Notes (Signed)
  Subjective:    Patient ID: Jacqueline [name], female    DOB: December 12, 1928, 79 y.o.   MRN: [id number]  HPI DISCHARGE DIAGNOSES:  1. Cerebellar CVA.  2. Hyperlipidemia.  3. History of GI bleed.  4. Atrial fibrillation with rapid ventricular response, controlled.  Jacqueline [name] was recently hospitalized for increased balance problems and dizziness. She was found to have a 2 cm cerebellar infarct thought to be the cause of her dizziness and balance problems. PT/OT recommended 24/7 care and PT. She was to continue on Xeralto and risk modification with BP control and control of lipids.   Since being home she has continued to have dizziness with position change but she has not fallen. She is still off blalance.    Review of Systems     Objective:   Physical Exam        Assessment & Plan:

## 2012-12-05 DIAGNOSIS — R279 Unspecified lack of coordination: Secondary | ICD-10-CM | POA: Diagnosis not present

## 2012-12-05 DIAGNOSIS — G8929 Other chronic pain: Secondary | ICD-10-CM | POA: Diagnosis not present

## 2012-12-05 DIAGNOSIS — I10 Essential (primary) hypertension: Secondary | ICD-10-CM | POA: Diagnosis not present

## 2012-12-05 DIAGNOSIS — I4891 Unspecified atrial fibrillation: Secondary | ICD-10-CM | POA: Diagnosis not present

## 2012-12-05 DIAGNOSIS — F411 Generalized anxiety disorder: Secondary | ICD-10-CM | POA: Diagnosis not present

## 2012-12-05 DIAGNOSIS — I69998 Other sequelae following unspecified cerebrovascular disease: Secondary | ICD-10-CM | POA: Diagnosis not present

## 2012-12-05 NOTE — Assessment & Plan Note (Signed)
BP Readings from Last 3 Encounters:  12/04/12 170/104  11/29/12 157/88  10/02/12 134/96   Poor control - of great concern in patient with h/o CVA  Plan Continue diltiazem, furosemide and lopressor.  Add clonidine 0.1 mg prn SBP >170, DBP > 100  This was discussed with the patient and her daughter Alinda Money - they are to check BP at home. If she needs frequent clonidine dosing will adjust her medications.

## 2012-12-05 NOTE — Assessment & Plan Note (Signed)
Recent cerebellar CVA - primary deficit is balance. She has HH-PT/OT and reports she is doing better. She continues to have 24/7 supervision.  Plan Continue Xeralto  Diligent effort to due risk reduction: BP control, lipid mgt.

## 2012-12-05 NOTE — Assessment & Plan Note (Signed)
Heart rate has been stable with persistent chronic a. Fib. Her CVA was felt to be embolic despite Xeralto.  Pln Continue CCB  Continue anticoagulation.

## 2012-12-05 NOTE — Assessment & Plan Note (Signed)
Stable on her present low dose frequently scheduled alprazolam. She has been resistant to using long acting anxiolytics.

## 2012-12-06 DIAGNOSIS — I69998 Other sequelae following unspecified cerebrovascular disease: Secondary | ICD-10-CM | POA: Diagnosis not present

## 2012-12-06 DIAGNOSIS — F411 Generalized anxiety disorder: Secondary | ICD-10-CM | POA: Diagnosis not present

## 2012-12-06 DIAGNOSIS — G8929 Other chronic pain: Secondary | ICD-10-CM | POA: Diagnosis not present

## 2012-12-06 DIAGNOSIS — I10 Essential (primary) hypertension: Secondary | ICD-10-CM | POA: Diagnosis not present

## 2012-12-06 DIAGNOSIS — R279 Unspecified lack of coordination: Secondary | ICD-10-CM | POA: Diagnosis not present

## 2012-12-06 DIAGNOSIS — I4891 Unspecified atrial fibrillation: Secondary | ICD-10-CM | POA: Diagnosis not present

## 2012-12-07 ENCOUNTER — Telehealth: Payer: Self-pay | Admitting: *Deleted

## 2012-12-07 NOTE — Telephone Encounter (Signed)
OK then. They may want to get a "life alert" for her in case she has a fall at night when she is alone.

## 2012-12-07 NOTE — Telephone Encounter (Signed)
Spoke with daughter advised of MDs message. 

## 2012-12-07 NOTE — Telephone Encounter (Signed)
Sheralyn Boatman, pts daughter called states at the visit yesterday with the Physical Therapist, the therapist advised the family to allow the pt to stay alone at night.  The family requests your input before they comply.  Please advise.

## 2012-12-11 DIAGNOSIS — I10 Essential (primary) hypertension: Secondary | ICD-10-CM | POA: Diagnosis not present

## 2012-12-11 DIAGNOSIS — I69998 Other sequelae following unspecified cerebrovascular disease: Secondary | ICD-10-CM | POA: Diagnosis not present

## 2012-12-11 DIAGNOSIS — R279 Unspecified lack of coordination: Secondary | ICD-10-CM | POA: Diagnosis not present

## 2012-12-11 DIAGNOSIS — G8929 Other chronic pain: Secondary | ICD-10-CM | POA: Diagnosis not present

## 2012-12-11 DIAGNOSIS — I4891 Unspecified atrial fibrillation: Secondary | ICD-10-CM | POA: Diagnosis not present

## 2012-12-11 DIAGNOSIS — F411 Generalized anxiety disorder: Secondary | ICD-10-CM | POA: Diagnosis not present

## 2012-12-12 ENCOUNTER — Telehealth: Payer: Self-pay

## 2012-12-12 DIAGNOSIS — I10 Essential (primary) hypertension: Secondary | ICD-10-CM | POA: Diagnosis not present

## 2012-12-12 DIAGNOSIS — F411 Generalized anxiety disorder: Secondary | ICD-10-CM | POA: Diagnosis not present

## 2012-12-12 DIAGNOSIS — R279 Unspecified lack of coordination: Secondary | ICD-10-CM | POA: Diagnosis not present

## 2012-12-12 DIAGNOSIS — I69998 Other sequelae following unspecified cerebrovascular disease: Secondary | ICD-10-CM | POA: Diagnosis not present

## 2012-12-12 DIAGNOSIS — I4891 Unspecified atrial fibrillation: Secondary | ICD-10-CM | POA: Diagnosis not present

## 2012-12-12 DIAGNOSIS — G8929 Other chronic pain: Secondary | ICD-10-CM | POA: Diagnosis not present

## 2012-12-12 NOTE — Telephone Encounter (Signed)
Phone call from [facility] at [facility] [phone]. She states blood pressure this morning was 145/105, at 9 am 178/129 with a HR of 134. Patient stated she was hesitant to take her meds because at times she gets dizzy but did end up taking the blood pressure medicine. After she took her meds BP went down to 134/104 with a HR of 60. Then at 1 pm BP was 128/83 HR 91. Patient ended up taking Clonidine at 3:00 die to BP back up 148/110 HR 72. Last check 124/70 HR 80. Lungs sound great. Amber states it sounds like patient had a lot of sodium intake day before so she went over diet information.

## 2012-12-12 NOTE — Telephone Encounter (Signed)
Patient notified and Amber notified of Dr Debby Bud message

## 2012-12-12 NOTE — Telephone Encounter (Signed)
She needs to take her Blood pressure medication EVERY day and not hold for dizziness which is really due to her cerebellar stroke.

## 2012-12-13 DIAGNOSIS — F411 Generalized anxiety disorder: Secondary | ICD-10-CM | POA: Diagnosis not present

## 2012-12-13 DIAGNOSIS — I69998 Other sequelae following unspecified cerebrovascular disease: Secondary | ICD-10-CM | POA: Diagnosis not present

## 2012-12-13 DIAGNOSIS — I10 Essential (primary) hypertension: Secondary | ICD-10-CM | POA: Diagnosis not present

## 2012-12-13 DIAGNOSIS — G8929 Other chronic pain: Secondary | ICD-10-CM | POA: Diagnosis not present

## 2012-12-13 DIAGNOSIS — R279 Unspecified lack of coordination: Secondary | ICD-10-CM | POA: Diagnosis not present

## 2012-12-13 DIAGNOSIS — I4891 Unspecified atrial fibrillation: Secondary | ICD-10-CM | POA: Diagnosis not present

## 2012-12-14 DIAGNOSIS — I10 Essential (primary) hypertension: Secondary | ICD-10-CM

## 2012-12-14 DIAGNOSIS — I69998 Other sequelae following unspecified cerebrovascular disease: Secondary | ICD-10-CM | POA: Diagnosis not present

## 2012-12-14 DIAGNOSIS — R279 Unspecified lack of coordination: Secondary | ICD-10-CM

## 2012-12-14 DIAGNOSIS — I4891 Unspecified atrial fibrillation: Secondary | ICD-10-CM | POA: Diagnosis not present

## 2012-12-14 DIAGNOSIS — G8929 Other chronic pain: Secondary | ICD-10-CM | POA: Diagnosis not present

## 2012-12-14 DIAGNOSIS — F411 Generalized anxiety disorder: Secondary | ICD-10-CM | POA: Diagnosis not present

## 2012-12-17 DIAGNOSIS — F411 Generalized anxiety disorder: Secondary | ICD-10-CM | POA: Diagnosis not present

## 2012-12-17 DIAGNOSIS — I4891 Unspecified atrial fibrillation: Secondary | ICD-10-CM | POA: Diagnosis not present

## 2012-12-17 DIAGNOSIS — I69998 Other sequelae following unspecified cerebrovascular disease: Secondary | ICD-10-CM | POA: Diagnosis not present

## 2012-12-17 DIAGNOSIS — R279 Unspecified lack of coordination: Secondary | ICD-10-CM | POA: Diagnosis not present

## 2012-12-17 DIAGNOSIS — G8929 Other chronic pain: Secondary | ICD-10-CM | POA: Diagnosis not present

## 2012-12-17 DIAGNOSIS — I10 Essential (primary) hypertension: Secondary | ICD-10-CM | POA: Diagnosis not present

## 2012-12-18 DIAGNOSIS — I10 Essential (primary) hypertension: Secondary | ICD-10-CM | POA: Diagnosis not present

## 2012-12-18 DIAGNOSIS — G8929 Other chronic pain: Secondary | ICD-10-CM | POA: Diagnosis not present

## 2012-12-18 DIAGNOSIS — R279 Unspecified lack of coordination: Secondary | ICD-10-CM | POA: Diagnosis not present

## 2012-12-18 DIAGNOSIS — I4891 Unspecified atrial fibrillation: Secondary | ICD-10-CM | POA: Diagnosis not present

## 2012-12-18 DIAGNOSIS — F411 Generalized anxiety disorder: Secondary | ICD-10-CM | POA: Diagnosis not present

## 2012-12-18 DIAGNOSIS — I69998 Other sequelae following unspecified cerebrovascular disease: Secondary | ICD-10-CM | POA: Diagnosis not present

## 2012-12-19 DIAGNOSIS — G8929 Other chronic pain: Secondary | ICD-10-CM | POA: Diagnosis not present

## 2012-12-19 DIAGNOSIS — I4891 Unspecified atrial fibrillation: Secondary | ICD-10-CM | POA: Diagnosis not present

## 2012-12-19 DIAGNOSIS — F411 Generalized anxiety disorder: Secondary | ICD-10-CM | POA: Diagnosis not present

## 2012-12-19 DIAGNOSIS — R279 Unspecified lack of coordination: Secondary | ICD-10-CM | POA: Diagnosis not present

## 2012-12-19 DIAGNOSIS — I69998 Other sequelae following unspecified cerebrovascular disease: Secondary | ICD-10-CM | POA: Diagnosis not present

## 2012-12-19 DIAGNOSIS — I10 Essential (primary) hypertension: Secondary | ICD-10-CM | POA: Diagnosis not present

## 2012-12-25 DIAGNOSIS — M19019 Primary osteoarthritis, unspecified shoulder: Secondary | ICD-10-CM | POA: Diagnosis not present

## 2012-12-25 DIAGNOSIS — IMO0002 Reserved for concepts with insufficient information to code with codable children: Secondary | ICD-10-CM | POA: Diagnosis not present

## 2012-12-25 DIAGNOSIS — M25519 Pain in unspecified shoulder: Secondary | ICD-10-CM | POA: Diagnosis not present

## 2012-12-26 DIAGNOSIS — I69998 Other sequelae following unspecified cerebrovascular disease: Secondary | ICD-10-CM | POA: Diagnosis not present

## 2012-12-26 DIAGNOSIS — G8929 Other chronic pain: Secondary | ICD-10-CM | POA: Diagnosis not present

## 2012-12-26 DIAGNOSIS — I4891 Unspecified atrial fibrillation: Secondary | ICD-10-CM | POA: Diagnosis not present

## 2012-12-26 DIAGNOSIS — I10 Essential (primary) hypertension: Secondary | ICD-10-CM | POA: Diagnosis not present

## 2012-12-26 DIAGNOSIS — R279 Unspecified lack of coordination: Secondary | ICD-10-CM | POA: Diagnosis not present

## 2012-12-26 DIAGNOSIS — F411 Generalized anxiety disorder: Secondary | ICD-10-CM | POA: Diagnosis not present

## 2012-12-27 ENCOUNTER — Other Ambulatory Visit: Payer: Self-pay | Admitting: Internal Medicine

## 2012-12-28 DIAGNOSIS — F411 Generalized anxiety disorder: Secondary | ICD-10-CM | POA: Diagnosis not present

## 2012-12-28 DIAGNOSIS — I6350 Cerebral infarction due to unspecified occlusion or stenosis of unspecified cerebral artery: Secondary | ICD-10-CM

## 2012-12-28 DIAGNOSIS — I4891 Unspecified atrial fibrillation: Secondary | ICD-10-CM

## 2012-12-28 DIAGNOSIS — I10 Essential (primary) hypertension: Secondary | ICD-10-CM | POA: Diagnosis not present

## 2013-01-01 ENCOUNTER — Other Ambulatory Visit: Payer: Self-pay

## 2013-01-01 MED ORDER — HYDROCODONE-ACETAMINOPHEN 7.5-325 MG PO TABS: 0.5000 | ORAL_TABLET | ORAL | Status: DC | PRN

## 2013-01-01 NOTE — Telephone Encounter (Signed)
Script for Hydrocodone re-printed for 90 day supply since being faxed to [facility]

## 2013-01-01 NOTE — Telephone Encounter (Signed)
Waiting for script to be signed so it can be faxed to [facility] [phone]

## 2013-01-01 NOTE — Telephone Encounter (Signed)
Script faxed to [facility] [phone]

## 2013-01-01 NOTE — Telephone Encounter (Signed)
She states when she has to take the Diltiazem and Clonidine when BP is hight she feels weak.

## 2013-01-01 NOTE — Telephone Encounter (Signed)
Pt called states when she takes Diltiazem and Clonidine she becomes very weak.  Please advise

## 2013-01-01 NOTE — Addendum Note (Signed)
Addended by: Lyanne Co R on: 01/01/2013 03:12 PM   Modules accepted: Orders

## 2013-01-02 ENCOUNTER — Telehealth: Payer: Self-pay | Admitting: Internal Medicine

## 2013-01-02 NOTE — Telephone Encounter (Signed)
Stop the valium

## 2013-01-02 NOTE — Telephone Encounter (Signed)
When I talked to her yesterday she states she thinks it's the Diltazem

## 2013-01-02 NOTE — Telephone Encounter (Signed)
Which of her many medications does she think is the culprit

## 2013-01-02 NOTE — Telephone Encounter (Signed)
Patient thinks the medication she is taking is making her weak and dizzy, she has tried taking the medication with and without food and it does not help, wants to know what Dr. Debby Bud thinks about this

## 2013-01-03 NOTE — Telephone Encounter (Signed)
My error - misread that. No she needs to continue the diltiazem for heart rate control.   Will need an ov before changing medications.

## 2013-01-03 NOTE — Telephone Encounter (Signed)
I don't see Valium on her med list. Do you mean the Diltiazem?

## 2013-01-03 NOTE — Telephone Encounter (Signed)
Patient states she has taken her blood pressure this morning and it was fine ( she couldn't tell me the numbers). She feeling better.  I let her know if anything changes to call us.

## 2013-01-05 DIAGNOSIS — I4891 Unspecified atrial fibrillation: Secondary | ICD-10-CM | POA: Diagnosis not present

## 2013-01-05 DIAGNOSIS — I69998 Other sequelae following unspecified cerebrovascular disease: Secondary | ICD-10-CM | POA: Diagnosis not present

## 2013-01-05 DIAGNOSIS — R279 Unspecified lack of coordination: Secondary | ICD-10-CM | POA: Diagnosis not present

## 2013-01-05 DIAGNOSIS — I10 Essential (primary) hypertension: Secondary | ICD-10-CM | POA: Diagnosis not present

## 2013-01-05 DIAGNOSIS — F411 Generalized anxiety disorder: Secondary | ICD-10-CM | POA: Diagnosis not present

## 2013-01-05 DIAGNOSIS — G8929 Other chronic pain: Secondary | ICD-10-CM | POA: Diagnosis not present

## 2013-01-11 DIAGNOSIS — F411 Generalized anxiety disorder: Secondary | ICD-10-CM | POA: Diagnosis not present

## 2013-01-11 DIAGNOSIS — I69998 Other sequelae following unspecified cerebrovascular disease: Secondary | ICD-10-CM | POA: Diagnosis not present

## 2013-01-11 DIAGNOSIS — I10 Essential (primary) hypertension: Secondary | ICD-10-CM | POA: Diagnosis not present

## 2013-01-11 DIAGNOSIS — R279 Unspecified lack of coordination: Secondary | ICD-10-CM | POA: Diagnosis not present

## 2013-01-11 DIAGNOSIS — G8929 Other chronic pain: Secondary | ICD-10-CM | POA: Diagnosis not present

## 2013-01-11 DIAGNOSIS — I4891 Unspecified atrial fibrillation: Secondary | ICD-10-CM | POA: Diagnosis not present

## 2013-01-12 ENCOUNTER — Emergency Department (HOSPITAL_COMMUNITY): Payer: Medicare Other

## 2013-01-12 ENCOUNTER — Inpatient Hospital Stay (HOSPITAL_COMMUNITY)
Admission: EM | Admit: 2013-01-12 | Discharge: 2013-01-15 | DRG: 690 | Disposition: A | Discharge status: Home Health | Payer: Medicare Other | Attending: Internal Medicine | Admitting: Internal Medicine

## 2013-01-12 ENCOUNTER — Encounter (HOSPITAL_COMMUNITY): Payer: Self-pay | Admitting: Vascular Surgery

## 2013-01-12 DIAGNOSIS — I10 Essential (primary) hypertension: Secondary | ICD-10-CM | POA: Diagnosis not present

## 2013-01-12 DIAGNOSIS — I480 Paroxysmal atrial fibrillation: Secondary | ICD-10-CM

## 2013-01-12 DIAGNOSIS — I639 Cerebral infarction, unspecified: Secondary | ICD-10-CM | POA: Diagnosis present

## 2013-01-12 DIAGNOSIS — Z8673 Personal history of transient ischemic attack (TIA), and cerebral infarction without residual deficits: Secondary | ICD-10-CM | POA: Diagnosis not present

## 2013-01-12 DIAGNOSIS — I4891 Unspecified atrial fibrillation: Secondary | ICD-10-CM | POA: Diagnosis not present

## 2013-01-12 DIAGNOSIS — F411 Generalized anxiety disorder: Secondary | ICD-10-CM | POA: Diagnosis present

## 2013-01-12 DIAGNOSIS — E876 Hypokalemia: Secondary | ICD-10-CM | POA: Diagnosis not present

## 2013-01-12 DIAGNOSIS — R29898 Other symptoms and signs involving the musculoskeletal system: Secondary | ICD-10-CM

## 2013-01-12 DIAGNOSIS — I6350 Cerebral infarction due to unspecified occlusion or stenosis of unspecified cerebral artery: Secondary | ICD-10-CM | POA: Diagnosis not present

## 2013-01-12 DIAGNOSIS — M5137 Other intervertebral disc degeneration, lumbosacral region: Secondary | ICD-10-CM | POA: Diagnosis present

## 2013-01-12 DIAGNOSIS — I25810 Atherosclerosis of coronary artery bypass graft(s) without angina pectoris: Secondary | ICD-10-CM | POA: Diagnosis present

## 2013-01-12 DIAGNOSIS — M1710 Unilateral primary osteoarthritis, unspecified knee: Secondary | ICD-10-CM | POA: Diagnosis present

## 2013-01-12 DIAGNOSIS — R229 Localized swelling, mass and lump, unspecified: Secondary | ICD-10-CM | POA: Diagnosis not present

## 2013-01-12 DIAGNOSIS — K219 Gastro-esophageal reflux disease without esophagitis: Secondary | ICD-10-CM | POA: Diagnosis present

## 2013-01-12 DIAGNOSIS — R5381 Other malaise: Secondary | ICD-10-CM | POA: Diagnosis not present

## 2013-01-12 DIAGNOSIS — Z87891 Personal history of nicotine dependence: Secondary | ICD-10-CM

## 2013-01-12 DIAGNOSIS — K449 Diaphragmatic hernia without obstruction or gangrene: Secondary | ICD-10-CM | POA: Diagnosis not present

## 2013-01-12 DIAGNOSIS — Z79899 Other long term (current) drug therapy: Secondary | ICD-10-CM

## 2013-01-12 DIAGNOSIS — Z8711 Personal history of peptic ulcer disease: Secondary | ICD-10-CM | POA: Diagnosis not present

## 2013-01-12 DIAGNOSIS — L989 Disorder of the skin and subcutaneous tissue, unspecified: Secondary | ICD-10-CM

## 2013-01-12 DIAGNOSIS — N390 Urinary tract infection, site not specified: Secondary | ICD-10-CM | POA: Diagnosis not present

## 2013-01-12 DIAGNOSIS — IMO0002 Reserved for concepts with insufficient information to code with codable children: Secondary | ICD-10-CM | POA: Diagnosis not present

## 2013-01-12 DIAGNOSIS — I6789 Other cerebrovascular disease: Secondary | ICD-10-CM | POA: Diagnosis not present

## 2013-01-12 DIAGNOSIS — M51379 Other intervertebral disc degeneration, lumbosacral region without mention of lumbar back pain or lower extremity pain: Secondary | ICD-10-CM | POA: Diagnosis present

## 2013-01-12 DIAGNOSIS — L02519 Cutaneous abscess of unspecified hand: Secondary | ICD-10-CM | POA: Diagnosis not present

## 2013-01-12 DIAGNOSIS — I110 Hypertensive heart disease with heart failure: Secondary | ICD-10-CM | POA: Diagnosis present

## 2013-01-12 LAB — URINE MICROSCOPIC-ADD ON

## 2013-01-12 LAB — URINALYSIS, ROUTINE W REFLEX MICROSCOPIC
Ketones, ur: NEGATIVE mg/dL
Nitrite: NEGATIVE
Urobilinogen, UA: 0.2 mg/dL (ref 0.0–1.0)
pH: 5 (ref 5.0–8.0)

## 2013-01-12 LAB — CBC WITH DIFFERENTIAL/PLATELET
Basophils Relative: 0 % (ref 0–1)
Eosinophils Absolute: 0 10*3/uL (ref 0.0–0.7)
Eosinophils Relative: 0 % (ref 0–5)
HCT: 41.3 % (ref 36.0–46.0)
Hemoglobin: 14.3 g/dL (ref 12.0–15.0)
MCH: 34.4 pg — ABNORMAL HIGH (ref 26.0–34.0)
MCHC: 34.6 g/dL (ref 30.0–36.0)
Monocytes Absolute: 0.8 10*3/uL (ref 0.1–1.0)
Monocytes Relative: 6 % (ref 3–12)

## 2013-01-12 LAB — BASIC METABOLIC PANEL
BUN: 30 mg/dL — ABNORMAL HIGH (ref 6–23)
Creatinine, Ser: 0.87 mg/dL (ref 0.50–1.10)
GFR calc Af Amer: 70 mL/min — ABNORMAL LOW (ref >90)
GFR calc non Af Amer: 60 mL/min — ABNORMAL LOW (ref >90)

## 2013-01-12 MED ORDER — SODIUM CHLORIDE 0.9 % IV BOLUS (SEPSIS)
500.0000 mL | Freq: Once | INTRAVENOUS | Status: AC
  Administered 2013-01-12: 500 mL via INTRAVENOUS

## 2013-01-12 MED ORDER — SODIUM CHLORIDE 0.9 % IV SOLN
INTRAVENOUS | Status: DC
  Administered 2013-01-12: 21:00:00 via INTRAVENOUS

## 2013-01-12 MED ORDER — CEFTRIAXONE SODIUM 1 G IJ SOLR
1.0000 g | Freq: Once | INTRAMUSCULAR | Status: AC
  Administered 2013-01-12: 1 g via INTRAVENOUS
  Filled 2013-01-12: qty 10

## 2013-01-12 NOTE — ED Provider Notes (Signed)
CSN: [id number]     Arrival date & time 01/12/13  1855 History   First MD Initiated Contact with Patient 01/12/13 1903     Chief Complaint  Patient presents with  . Weakness   (Consider location/radiation/quality/duration/timing/severity/associated sxs/prior Treatment) HPI Comments: Jacqueline [name] is a 79 y.o. female who was sitting on her commode, today, when she could not get up. She called her daughter, who came to her home, but could not get in because the door was locked. The daughter called a  locksmith, and after some period of time was able to get into the house. The patient was still sitting on the commode. She has been having periods of weakness and difficulty moving from sitting to standing for several weeks. She states that she has been "sick". She defines this as feeling nauseated, having decreased appetite, and an overall weak feeling. She has been too sick to see her doctor since she was discharged from the hospital about one month ago. She denies fever or chills, cough, shortness of breath, chest pain, change in bowel or urinary habits. There are no other known modifying factors.   Patient is a 79 y.o. female presenting with weakness. The history is provided by the patient.  Weakness    Past Medical History  Diagnosis Date  . Allergy, unspecified not elsewhere classified   . Other dyspnea and respiratory abnormality   . Other diseases of lung, not elsewhere classified   . Anemia, unspecified   . Unspecified adverse effect of other drug, medicinal and biological substance(995.29)   . Blood in stool   . Hemorrhage of gastrointestinal tract, unspecified   . Hemorrhage of gastrointestinal tract, unspecified   . Personal history of peptic ulcer disease   . Anxiety state, unspecified   . Personal history of other diseases of digestive system   . Unspecified essential hypertension   . Complication of anesthesia     "hard to wake me up after colonoscopy" (03/13/12)  . H/O hiatal  hernia   . GERD (gastroesophageal reflux disease)   . Unspecified paranoid state   . Atrial fibrillation with rapid ventricular response 03/13/12  . Osteoarthrosis, unspecified whether generalized or localized, lower leg   . Degeneration of lumbar or lumbosacral intervertebral disc   . Degeneration of lumbar or lumbosacral intervertebral disc   . Unspecified cerebral artery occlusion with cerebral infarction 10/2008    "mini stroke" denies residual (03/13/12)   Past Surgical History  Procedure Laterality Date  . Pilonidal cyst / sinus excision  1957  . Dilation and curettage of uterus  1950's?  . Shoulder arthroscopy  ~ 2000    right   Family History  Problem Relation Age of Onset  . Diabetes Mother   . Other Mother     cva  . Other Father     CVA   History  Substance Use Topics  . Smoking status: Former Smoker -- 1.00 packs/day for 40 years    Types: Cigarettes    Quit date: 05/02/2002  . Smokeless tobacco: Never Used  . Alcohol Use: No   OB History   Grav Para Term Preterm Abortions TAB SAB Ect Mult Living                 Review of Systems  Neurological: Positive for weakness.  All other systems reviewed and are negative.    Allergies  Metoclopramide hcl; Penicillins; and Aspirin  Home Medications   Current Outpatient Rx  Name  Route  Sig  Dispense  Refill  . ALPRAZolam (XANAX) 0.25 MG tablet   Oral   Take 0.125 mg by mouth 4 (four) times daily as needed for anxiety.         . cloNIDine (CATAPRES) 0.1 MG tablet   Oral   Take 1 tablet (0.1 mg total) by mouth every 2 (two) hours as needed (take as needed for a  Blood pressure that is greater than 160 on top, or bottom number greater than 100.).   30 tablet   3   . diltiazem (DILACOR XR) 180 MG 24 hr capsule   Oral   Take 180 mg by mouth 2 (two) times daily.         . furosemide (LASIX) 20 MG tablet   Oral   Take 20 mg by mouth daily.         Marland Kitchen HYDROcodone-acetaminophen (NORCO) 7.5-325 MG per  tablet   Oral   Take 0.5-1 tablets by mouth every 4 (four) hours as needed for pain.   180 tablet   0   . metoprolol (LOPRESSOR) 50 MG tablet   Oral   Take 1 tablet (50 mg total) by mouth 2 (two) times daily.   180 tablet   3     Pt needs appointment  - call [phone]   . Multiple Vitamins-Minerals (CENTRUM SILVER PO)   Oral   Take 1 tablet by mouth daily.         . pantoprazole (PROTONIX) 40 MG tablet      TAKE 1 TABLET EVERY MORNING   90 tablet   2   . potassium chloride SA (K-DUR,KLOR-CON) 20 MEQ tablet   Oral   Take 20 mEq by mouth daily.         . Rivaroxaban (XARELTO) 15 MG TABS tablet   Oral   Take 1 tablet (15 mg total) by mouth daily with supper.   90 tablet   3   . traMADol (ULTRAM) 50 MG tablet   Oral   Take 50 mg by mouth every 6 (six) hours as needed for pain.          BP 142/70  Pulse 92  Temp(Src) 98.5 F (36.9 C) (Oral)  Resp 18  SpO2 98% Physical Exam  Nursing note and vitals reviewed. Constitutional: She is oriented to person, place, and time. She appears well-developed.  Elderly, obese  HENT:  Head: Normocephalic and atraumatic.  Eyes: Conjunctivae and EOM are normal. Pupils are equal, round, and reactive to light.  Neck: Normal range of motion and phonation normal. Neck supple.  Cardiovascular: Normal rate, regular rhythm and intact distal pulses.   Pulmonary/Chest: Effort normal and breath sounds normal. She exhibits no tenderness.  Abdominal: Soft. She exhibits no distension. There is no tenderness. There is no guarding.  Musculoskeletal: Normal range of motion.   Abnormality dorsum, right hand, as described in the skin section. There is normal. Range of motion of the right hand.  Neurological: She is alert and oriented to person, place, and time. She has normal strength. She exhibits normal muscle tone.  Skin: Skin is warm and dry.  Right dorsum of the hand, has a red fluctuant nodule about 2 cm in diameter and raised about 1 cm.  There is mild, diffuse erythema about this lesion.   Psychiatric: She has a normal mood and affect. Her behavior is normal. Judgment and thought content normal.    ED Course  Procedures (including critical care time)  Medications  0.9 %  sodium chloride infusion ( Intravenous New Bag/Given 01/12/13 2101)  sodium chloride 0.9 % bolus 500 mL (500 mLs Intravenous New Bag/Given 01/12/13 2101)  cefTRIAXone (ROCEPHIN) 1 g in dextrose 5 % 50 mL IVPB (1 g Intravenous New Bag/Given 01/12/13 2305)    Patient Vitals for the past 24 hrs:  BP Temp Temp src Pulse Resp SpO2  01/12/13 2230 142/70 mmHg - - 92 18 98 %  01/12/13 1945 150/70 mmHg - - 101 17 99 %  01/12/13 1919 152/82 mmHg 98.5 F (36.9 C) Oral 92 18 98 %    10:43 PM Reevaluation with update and discussion. After initial assessment and treatment, an updated evaluation reveals No change in Status. Lorena Benham L   INCISION AND DRAINAGE Performed by: Flint Melter Consent: Verbal consent obtained. Risks and benefits: risks, benefits and alternatives were discussed Type: abscess  Body area: Right dorsal hand  Anesthesia: local infiltration  Incision was made with a scalpel.  Local anesthetic: lidocaine 2% % without epinephrine  Anesthetic total: 2 ml  Complexity: complex Blunt dissection to break up loculations  Drainage: purulent  Drainage amount: Moderate     Patient tolerance: Patient tolerated the procedure well with no immediate complications.  12:28 AM-Consult complete with Dr. Allena Katz. Patient case explained and discussed. He agrees to admit patient for further evaluation and treatment. Call ended at 0035   Labs Review Labs Reviewed  CBC WITH DIFFERENTIAL - Abnormal; Notable for the following:    WBC 13.4 (*)    MCH 34.4 (*)    Neutrophils Relative % 87 (*)    Neutro Abs 11.6 (*)    Lymphocytes Relative 7 (*)    All other components within normal limits  BASIC METABOLIC PANEL - Abnormal; Notable for the  following:    Glucose, Bld 104 (*)    BUN 30 (*)    GFR calc non Af Amer 60 (*)    GFR calc Af Amer 70 (*)    All other components within normal limits  URINALYSIS, ROUTINE W REFLEX MICROSCOPIC - Abnormal; Notable for the following:    APPearance CLOUDY (*)    Bilirubin Urine SMALL (*)    Leukocytes, UA LARGE (*)    All other components within normal limits  URINE MICROSCOPIC-ADD ON - Abnormal; Notable for the following:    Squamous Epithelial / LPF FEW (*)    Bacteria, UA FEW (*)    Casts GRANULAR CAST (*)    All other components within normal limits  URINE CULTURE   Imaging Review Dg Chest 2 View  01/12/2013   CLINICAL DATA:  Weakness.  EXAM: CHEST  2 VIEW  COMPARISON:  11/26/2012  FINDINGS: Large hiatal hernia. Mild cardiomegaly. No confluent opacities or effusions. No acute bony abnormality. Degenerative changes in the shoulders.  IMPRESSION: Stable large hiatal hernia. No acute findings.   Electronically Signed   By: Charlett Nose M.D.   On: 01/12/2013 20:46    MDM   1. UTI (lower urinary tract infection)   2. Abscess of hand    Weakness, likely secondary to urinary tract infection. Doubt urosepsis, metabolic instability, or CVA. Incidental right hand abscess, has been treated with incision and drainage. This needs to be monitored for improvement, and treated with warm compresses. She would likely benefit from a course of antibiotics for 7 days, to treat skin and soft tissue infection. Patient will need to be admitted for further treatment, observation, and stabilization   Nursing Notes Reviewed/ Care Coordinated, and agree without  changes. Applicable Imaging Reviewed.  Interpretation of Laboratory Data incorporated into ED treatment    Plan: Admit      Flint Melter, MD 01/13/13 [phone]

## 2013-01-12 NOTE — ED Notes (Signed)
Pt reports to the ED for eval of difficulty moving from sitting to standing. Pt reports she had an episode approx 1 month ago and has had this trouble ever since. Pt unsure of TIA or stroke. Pt also reports that she has been sitting for 5 or 6 hours. Pt denies any pain. Pts only complaint is difficultly standing. Pt had in home physical therapy for 3 weeks following the incident but has not had any since her last scheduled visit on Saturday. Pt was seen at the [facility] today for an injury to her hand from repeated rubbing on a hard surface. CMS intact. Swelling and erythema at the site. Pt A&O x4. No neuro deficits noted. Pt denies any pain.

## 2013-01-13 DIAGNOSIS — N390 Urinary tract infection, site not specified: Secondary | ICD-10-CM | POA: Diagnosis not present

## 2013-01-13 DIAGNOSIS — E876 Hypokalemia: Secondary | ICD-10-CM | POA: Diagnosis not present

## 2013-01-13 DIAGNOSIS — I10 Essential (primary) hypertension: Secondary | ICD-10-CM | POA: Diagnosis not present

## 2013-01-13 LAB — CBC WITH DIFFERENTIAL/PLATELET
Hemoglobin: 12.6 g/dL (ref 12.0–15.0)
Lymphs Abs: 0.9 10*3/uL (ref 0.7–4.0)
Monocytes Relative: 7 % (ref 3–12)
Neutro Abs: 7.7 10*3/uL (ref 1.7–7.7)
Neutrophils Relative %: 82 % — ABNORMAL HIGH (ref 43–77)
RBC: 3.68 MIL/uL — ABNORMAL LOW (ref 3.87–5.11)

## 2013-01-13 LAB — PROTIME-INR: Prothrombin Time: 12.9 seconds (ref 11.6–15.2)

## 2013-01-13 LAB — COMPREHENSIVE METABOLIC PANEL
Alkaline Phosphatase: 65 U/L (ref 39–117)
BUN: 18 mg/dL (ref 6–23)
Chloride: 107 mEq/L (ref 96–112)
GFR calc Af Amer: >90 mL/min (ref >90)
Glucose, Bld: 113 mg/dL — ABNORMAL HIGH (ref 70–99)
Potassium: 3.2 mEq/L — ABNORMAL LOW (ref 3.5–5.1)
Total Bilirubin: 0.4 mg/dL (ref 0.3–1.2)

## 2013-01-13 MED ORDER — INFLUENZA VAC SPLIT QUAD 0.5 ML IM SUSP
0.5000 mL | INTRAMUSCULAR | Status: AC
  Administered 2013-01-14: 0.5 mL via INTRAMUSCULAR
  Filled 2013-01-13: qty 0.5

## 2013-01-13 MED ORDER — ALPRAZOLAM 0.25 MG PO TABS
0.1250 mg | ORAL_TABLET | Freq: Four times a day (QID) | ORAL | Status: DC | PRN
  Administered 2013-01-13 – 2013-01-15 (×7): 0.125 mg via ORAL
  Filled 2013-01-13 (×8): qty 1

## 2013-01-13 MED ORDER — DILTIAZEM HCL ER 180 MG PO CP24
180.0000 mg | ORAL_CAPSULE | Freq: Two times a day (BID) | ORAL | Status: DC
  Administered 2013-01-13 – 2013-01-15 (×6): 180 mg via ORAL
  Filled 2013-01-13 (×7): qty 1

## 2013-01-13 MED ORDER — SODIUM CHLORIDE 0.9 % IV SOLN: INTRAVENOUS | Status: AC

## 2013-01-13 MED ORDER — HYDROCODONE-ACETAMINOPHEN 7.5-325 MG PO TABS
0.5000 | ORAL_TABLET | ORAL | Status: DC | PRN
Start: 2013-01-13 — End: 2013-01-15
  Administered 2013-01-13: 1 via ORAL
  Administered 2013-01-14 – 2013-01-15 (×4): 0.5 via ORAL
  Filled 2013-01-13 (×6): qty 1

## 2013-01-13 MED ORDER — METOPROLOL TARTRATE 50 MG PO TABS
50.0000 mg | ORAL_TABLET | Freq: Two times a day (BID) | ORAL | Status: DC
  Administered 2013-01-13 – 2013-01-15 (×5): 50 mg via ORAL
  Filled 2013-01-13 (×6): qty 1

## 2013-01-13 MED ORDER — RIVAROXABAN 15 MG PO TABS
15.0000 mg | ORAL_TABLET | Freq: Every day | ORAL | Status: DC
  Administered 2013-01-14: 15 mg via ORAL
  Filled 2013-01-13 (×3): qty 1

## 2013-01-13 MED ORDER — DEXTROSE 5 % IV SOLN
1.0000 g | INTRAVENOUS | Status: DC
  Administered 2013-01-13: 1 g via INTRAVENOUS
  Filled 2013-01-13: qty 10

## 2013-01-13 MED ORDER — SODIUM CHLORIDE 0.9 % IV SOLN
INTRAVENOUS | Status: DC
  Administered 2013-01-13: 22:00:00 via INTRAVENOUS

## 2013-01-13 MED ORDER — PANTOPRAZOLE SODIUM 40 MG PO TBEC
40.0000 mg | DELAYED_RELEASE_TABLET | Freq: Every day | ORAL | Status: DC
  Administered 2013-01-13 – 2013-01-15 (×3): 40 mg via ORAL
  Filled 2013-01-13 (×3): qty 1

## 2013-01-13 MED ORDER — POTASSIUM CHLORIDE CRYS ER 20 MEQ PO TBCR
20.0000 meq | EXTENDED_RELEASE_TABLET | Freq: Every day | ORAL | Status: DC
  Administered 2013-01-13 – 2013-01-15 (×3): 20 meq via ORAL
  Filled 2013-01-13 (×4): qty 1

## 2013-01-13 NOTE — Progress Notes (Signed)
Assessment/Plan: Principal Problem:   UTI (lower urinary tract infection) - on IV abx. Feeling better. WBC improving.  I told her she would be here 2-3 days getting her strength back to get home safely.  Active Problems:   HYPERTENSION - BP acceptable overall. Up some today.    CVA (cerebral infarction)   CAD (coronary artery disease) of artery bypass graft   PAF (paroxysmal atrial fibrillation)   Hypokalemia - K is low. Will resume home KCl.    Subjective: Feels much better. Feels a bit stronger.   Objective:  Vital Signs: Filed Vitals:   01/13/13 0130 01/13/13 0142 01/13/13 0238 01/13/13 0650  BP:  146/82 155/60 181/86  Pulse: 103  80 80  Temp:   98.2 F (36.8 C) 98.6 F (37 C)  TempSrc:   Oral Oral  Resp: 22  20 18   Height:   5\' 2"  (1.575 m)   Weight:   68.493 kg (151 lb)   SpO2: 97%  97% 98%     EXAM: Alert. Comfortable.    Intake/Output Summary (Last 24 hours) at 01/13/13 0929 Last data filed at 01/13/13 0600  Gross per 24 hour  Intake    400 ml  Output      0 ml  Net    400 ml    Lab Results:  Recent Labs  01/12/13 2014 01/13/13 0528  NA 138 143  K 3.6 3.2*  CL 100 107  CO2 24 25  GLUCOSE 104* 113*  BUN 30* 18  CREATININE 0.87 0.58  CALCIUM 9.8 9.1    Recent Labs  01/13/13 0528  AST 18  ALT 21  ALKPHOS 65  BILITOT 0.4  PROT 5.8*  ALBUMIN 2.7*   No results found for this basename: LIPASE, AMYLASE,  in the last 72 hours  Recent Labs  01/12/13 2014 01/13/13 0528  WBC 13.4* 9.4  NEUTROABS 11.6* 7.7  HGB 14.3 12.6  HCT 41.3 36.5  MCV 99.3 99.2  PLT 167 166   No results found for this basename: CKTOTAL, CKMB, CKMBINDEX, TROPONINI,  in the last 72 hours No components found with this basename: POCBNP,  No results found for this basename: DDIMER,  in the last 72 hours No results found for this basename: HGBA1C,  in the last 72 hours No results found for this basename: CHOL, HDL, LDLCALC, TRIG, CHOLHDL, LDLDIRECT,  in the last 72  hours No results found for this basename: TSH, T4TOTAL, FREET3, T3FREE, THYROIDAB,  in the last 72 hours No results found for this basename: VITAMINB12, FOLATE, FERRITIN, TIBC, IRON, RETICCTPCT,  in the last 72 hours  Studies/Results: Dg Chest 2 View  01/12/2013   CLINICAL DATA:  Weakness.  EXAM: CHEST  2 VIEW  COMPARISON:  11/26/2012  FINDINGS: Large hiatal hernia. Mild cardiomegaly. No confluent opacities or effusions. No acute bony abnormality. Degenerative changes in the shoulders.  IMPRESSION: Stable large hiatal hernia. No acute findings.   Electronically Signed   By: Charlett Nose M.D.   On: 01/12/2013 20:46   Medications: Medications administered in the last 24 hours reviewed.  Current Medication List reviewed.    LOS: 1 day   [facility] @ [facility] [phone]) 01/13/2013, 9:29 AM

## 2013-01-13 NOTE — Progress Notes (Signed)
ANTICOAGULATION CONSULT NOTE - Initial Consult  Pharmacy Consult for Xarelto Indication: atrial fibrillation  Allergies  Allergen Reactions  . Metoclopramide Hcl Other (See Comments)     Tremors  . Penicillins Itching  . Aspirin Other (See Comments)    "gave me stomach aches; made me bleed" (03/13/12)    Patient Measurements: Height: 5\' 2"  (157.5 cm) Weight: 151 lb (68.493 kg) IBW/kg (Calculated) : 50.1  Vital Signs: Temp: 98.2 F (36.8 C) (09/14 0238) Temp src: Oral (09/14 0238) BP: 155/60 mmHg (09/14 0238) Pulse Rate: 80 (09/14 0238)  Labs:  Recent Labs  01/12/13 2014  HGB 14.3  HCT 41.3  PLT 167  CREATININE 0.87    Estimated Creatinine Clearance: 45.3 ml/min (by C-G formula based on Cr of 0.87).   Medical History: Past Medical History  Diagnosis Date  . Allergy, unspecified not elsewhere classified   . Other dyspnea and respiratory abnormality   . Other diseases of lung, not elsewhere classified   . Anemia, unspecified   . Unspecified adverse effect of other drug, medicinal and biological substance(995.29)   . Blood in stool   . Hemorrhage of gastrointestinal tract, unspecified   . Hemorrhage of gastrointestinal tract, unspecified   . Personal history of peptic ulcer disease   . Anxiety state, unspecified   . Personal history of other diseases of digestive system   . Unspecified essential hypertension   . Complication of anesthesia     "hard to wake me up after colonoscopy" (03/13/12)  . H/O hiatal hernia   . GERD (gastroesophageal reflux disease)   . Unspecified paranoid state   . Atrial fibrillation with rapid ventricular response 03/13/12  . Osteoarthrosis, unspecified whether generalized or localized, lower leg   . Degeneration of lumbar or lumbosacral intervertebral disc   . Degeneration of lumbar or lumbosacral intervertebral disc   . Unspecified cerebral artery occlusion with cerebral infarction 10/2008    "mini stroke" denies residual  (03/13/12)    Medications:  Prescriptions prior to admission  Medication Sig Dispense Refill  . ALPRAZolam (XANAX) 0.25 MG tablet Take 0.125 mg by mouth 4 (four) times daily as needed for anxiety.      . cloNIDine (CATAPRES) 0.1 MG tablet Take 1 tablet (0.1 mg total) by mouth every 2 (two) hours as needed (take as needed for a  Blood pressure that is greater than 160 on top, or bottom number greater than 100.).  30 tablet  3  . diltiazem (DILACOR XR) 180 MG 24 hr capsule Take 180 mg by mouth 2 (two) times daily.      . furosemide (LASIX) 20 MG tablet Take 20 mg by mouth daily.      Marland Kitchen HYDROcodone-acetaminophen (NORCO) 7.5-325 MG per tablet Take 0.5-1 tablets by mouth every 4 (four) hours as needed for pain.  180 tablet  0  . metoprolol (LOPRESSOR) 50 MG tablet Take 1 tablet (50 mg total) by mouth 2 (two) times daily.  180 tablet  3  . Multiple Vitamins-Minerals (CENTRUM SILVER PO) Take 1 tablet by mouth daily.      . pantoprazole (PROTONIX) 40 MG tablet TAKE 1 TABLET EVERY MORNING  90 tablet  2  . potassium chloride SA (K-DUR,KLOR-CON) 20 MEQ tablet Take 20 mEq by mouth daily.      . Rivaroxaban (XARELTO) 15 MG TABS tablet Take 1 tablet (15 mg total) by mouth daily with supper.  90 tablet  3  . traMADol (ULTRAM) 50 MG tablet Take 50 mg by mouth every  6 (six) hours as needed for pain.       Scheduled:  . sodium chloride   Intravenous STAT  . diltiazem  180 mg Oral BID  . metoprolol  50 mg Oral BID  . pantoprazole  40 mg Oral Daily  . rivaroxaban  15 mg Oral Q supper   Infusions:  . sodium chloride      Assessment: 79yo female c/o weakness, nausea, and anorexia, admitted for UTI, to continue Xarelto for Afib.   Plan:  Pt is appropriately on Xarelto 15mg  daily; will continue home dose and monitor.  Vernard Gambles, PharmD, BCPS  01/13/2013,3:00 AM

## 2013-01-13 NOTE — Progress Notes (Signed)
79yo female c/o weakness, nausea, and anorexia, admitted for UTI, doubtful urosepsis.  Will begin Rocephin 1g IV Q24H and monitor.

## 2013-01-13 NOTE — H&P (Signed)
[facility] History and Physical  Patient: Jacqueline [name]  HYQ:[id number]  DOB: 12/23/1928  DOA: 01/12/2013  Referring physician: Dr. Effie Shy PCP: Illene Regulus, MD  Consults:     Chief Complaint: Generalized weakness  HPI: Jacqueline [name] is a 79 y.o. female with Past medical history of TIA, A. fib, on Xarelto, hypertension, GI bleed and a recent CVA. The patient was at her baseline one week ago. Since last few days she has been having progressively worsening lethargy and weakness. She also complains of burning urination since last to 3 days. Today when she went for restroom she could not stand up because of the weakness from the commode, and the daughter has to call a locksmith to get into the house. She also had an episode of nausea but did not have any vomiting or abdominal pain today.  Pt denies any fever, chills, headache, cough, chest pain, palpitation, PND, vomiting, abdominal pain, diarrhea, constipation, active bleeding, dizziness, WORSENING pedal edema,  focal neurological deficit. She mentions that she is compliant with all the medications and denies any recent changes.   Review of Systems: as mentioned in the history of present illness.  A Comprehensive review of the other systems is negative.  Past Medical History  Diagnosis Date  . Allergy, unspecified not elsewhere classified   . Other dyspnea and respiratory abnormality   . Other diseases of lung, not elsewhere classified   . Anemia, unspecified   . Unspecified adverse effect of other drug, medicinal and biological substance(995.29)   . Blood in stool   . Hemorrhage of gastrointestinal tract, unspecified   . Hemorrhage of gastrointestinal tract, unspecified   . Personal history of peptic ulcer disease   . Anxiety state, unspecified   . Personal history of other diseases of digestive system   . Unspecified essential hypertension   . Complication of anesthesia     "hard to wake me up after colonoscopy" (03/13/12)  .  H/O hiatal hernia   . GERD (gastroesophageal reflux disease)   . Unspecified paranoid state   . Atrial fibrillation with rapid ventricular response 03/13/12  . Osteoarthrosis, unspecified whether generalized or localized, lower leg   . Degeneration of lumbar or lumbosacral intervertebral disc   . Degeneration of lumbar or lumbosacral intervertebral disc   . Unspecified cerebral artery occlusion with cerebral infarction 10/2008    "mini stroke" denies residual (03/13/12)   Past Surgical History  Procedure Laterality Date  . Pilonidal cyst / sinus excision  1957  . Dilation and curettage of uterus  1950's?  . Shoulder arthroscopy  ~ 2000    right   Social History:  reports that she quit smoking about 10 years ago. Her smoking use included Cigarettes. She has a 40 pack-year smoking history. She has never used smokeless tobacco. She reports that she does not drink alcohol or use illicit drugs. Patient is coming from home. Independent for most of her  ADL.  Allergies  Allergen Reactions  . Metoclopramide Hcl Other (See Comments)     Tremors  . Penicillins Itching  . Aspirin Other (See Comments)    "gave me stomach aches; made me bleed" (03/13/12)    Family History  Problem Relation Age of Onset  . Diabetes Mother   . Other Mother     cva  . Other Father     CVA    Prior to Admission medications   Medication Sig Start Date End Date Taking? Authorizing Provider  ALPRAZolam Prudy Feeler) 0.25 MG tablet Take 0.125  mg by mouth 4 (four) times daily as needed for anxiety.   Yes Historical Provider, MD  cloNIDine (CATAPRES) 0.1 MG tablet Take 1 tablet (0.1 mg total) by mouth every 2 (two) hours as needed (take as needed for a  Blood pressure that is greater than 160 on top, or bottom number greater than 100.). 12/04/12  Yes Jacques Navy, MD  diltiazem (DILACOR XR) 180 MG 24 hr capsule Take 180 mg by mouth 2 (two) times daily.   Yes Historical Provider, MD  furosemide (LASIX) 20 MG tablet  Take 20 mg by mouth daily.   Yes Historical Provider, MD  HYDROcodone-acetaminophen (NORCO) 7.5-325 MG per tablet Take 0.5-1 tablets by mouth every 4 (four) hours as needed for pain. 01/01/13  Yes Jacques Navy, MD  metoprolol (LOPRESSOR) 50 MG tablet Take 1 tablet (50 mg total) by mouth 2 (two) times daily. 12/04/12  Yes Jacques Navy, MD  Multiple Vitamins-Minerals (CENTRUM SILVER PO) Take 1 tablet by mouth daily.   Yes Historical Provider, MD  pantoprazole (PROTONIX) 40 MG tablet TAKE 1 TABLET EVERY MORNING 12/03/12  Yes Jacques Navy, MD  potassium chloride SA (K-DUR,KLOR-CON) 20 MEQ tablet Take 20 mEq by mouth daily.   Yes Historical Provider, MD  Rivaroxaban (XARELTO) 15 MG TABS tablet Take 1 tablet (15 mg total) by mouth daily with supper. 11/29/12  Yes Jacques Navy, MD  traMADol (ULTRAM) 50 MG tablet Take 50 mg by mouth every 6 (six) hours as needed for pain.   Yes Historical Provider, MD    Physical Exam: Filed Vitals:   01/12/13 2230 01/13/13 0015 01/13/13 0130 01/13/13 0142  BP: 142/70 153/84  146/82  Pulse: 92 86 103   Temp:      TempSrc:      Resp: 18 21 22    SpO2: 98% 98% 97%     General: Alert, Awake and Oriented to Time, Place and Person. Appear in mild distress Eyes: PERRL ENT: Oral Mucosa clear moist. Neck: no JVD, no Carotid Bruits  Cardiovascular: S1 and S2 Present, no Murmur, Peripheral Pulses Present Respiratory: Bilateral Air entry equal and Decreased, Clear to Auscultation,  no Crackles,no wheezes Abdomen: Bowel Sound Present, Soft and Non tender Skin: no Rash Extremities: Bilateral Pedal edema, no calf tenderness Neurologic: Grossly Unremarkable.  Labs on Admission:  CBC:  Recent Labs Lab 01/12/13 2014  WBC 13.4*  NEUTROABS 11.6*  HGB 14.3  HCT 41.3  MCV 99.3  PLT 167    CMP     Component Value Date/Time   NA 138 01/12/2013 2014   K 3.6 01/12/2013 2014   CL 100 01/12/2013 2014   CO2 24 01/12/2013 2014   GLUCOSE 104* 01/12/2013 2014    BUN 30* 01/12/2013 2014   CREATININE 0.87 01/12/2013 2014   CALCIUM 9.8 01/12/2013 2014   PROT 6.4 11/26/2012 2037   ALBUMIN 3.3* 11/26/2012 2037   AST 25 11/26/2012 2037   ALT 28 11/26/2012 2037   ALKPHOS 70 11/26/2012 2037   BILITOT 0.4 11/26/2012 2037   GFRNONAA 60* 01/12/2013 2014   GFRAA 70* 01/12/2013 2014    No results found for this basename: LIPASE, AMYLASE,  in the last 168 hours No results found for this basename: AMMONIA,  in the last 168 hours  Cardiac Enzymes: No results found for this basename: CKTOTAL, CKMB, CKMBINDEX, TROPONINI,  in the last 168 hours  BNP (last 3 results)  Recent Labs  04/04/12 1025  PROBNP 88.0    Radiological  Exams on Admission: Dg Chest 2 View  01/12/2013   CLINICAL DATA:  Weakness.  EXAM: CHEST  2 VIEW  COMPARISON:  11/26/2012  FINDINGS: Large hiatal hernia. Mild cardiomegaly. No confluent opacities or effusions. No acute bony abnormality. Degenerative changes in the shoulders.  IMPRESSION: Stable large hiatal hernia. No acute findings.   Electronically Signed   By: Charlett Nose M.D.   On: 01/12/2013 20:46    EKG: Independently reviewed. unchanged from previous tracings.  Assessment/Plan Principal Problem:   UTI (lower urinary tract infection) Active Problems:   HYPERTENSION   CVA (cerebral infarction)   CAD (coronary artery disease) of artery bypass graft   PAF (paroxysmal atrial fibrillation)   1. UTI (lower urinary tract infection) Patient the complaint of generalized lethargy and burning urination. UA is positive for pyuria.  Urine culture is sent. Patient will be started on IV ceftriaxone. Gentle IV hydration will be provided. Patient will be admitted for telemetry due to the history. She does not have any focal neurological deficit, that would suggest a new TIA versus CVA. Neuro checks every 4 hour.  2. A. Fib Continue Cardizem and Lopressor. Continues rivaroxaban.  3. Hypertension. Considering her generalized weakness holding  Lasix and vomiting and continuing Lopressor and Cardizem.  4. GERD Continue Protonix  DVT Prophylaxis: mechanical compression device Nutrition: Cardiac diet  Code Status: Full  Disposition: Admitted to inpatient in telemetry.  Author: Lynden Oxford, MD [facility] Pager: [phone] 01/13/2013, 2:11 AM    If 7PM-7AM, please contact night-coverage [email or web address] Password [email or web address]

## 2013-01-14 DIAGNOSIS — I25810 Atherosclerosis of coronary artery bypass graft(s) without angina pectoris: Secondary | ICD-10-CM

## 2013-01-14 DIAGNOSIS — E876 Hypokalemia: Secondary | ICD-10-CM

## 2013-01-14 DIAGNOSIS — R29898 Other symptoms and signs involving the musculoskeletal system: Secondary | ICD-10-CM

## 2013-01-14 DIAGNOSIS — I4891 Unspecified atrial fibrillation: Secondary | ICD-10-CM

## 2013-01-14 DIAGNOSIS — L02519 Cutaneous abscess of unspecified hand: Secondary | ICD-10-CM

## 2013-01-14 DIAGNOSIS — N390 Urinary tract infection, site not specified: Secondary | ICD-10-CM | POA: Diagnosis not present

## 2013-01-14 LAB — COMPREHENSIVE METABOLIC PANEL
ALT: 21 U/L (ref 0–35)
AST: 18 U/L (ref 0–37)
Albumin: 2.7 g/dL — ABNORMAL LOW (ref 3.5–5.2)
Alkaline Phosphatase: 65 U/L (ref 39–117)
BUN: 11 mg/dL (ref 6–23)
CO2: 26 mEq/L (ref 19–32)
Calcium: 9.2 mg/dL (ref 8.4–10.5)
Chloride: 106 mEq/L (ref 96–112)
Creatinine, Ser: 0.54 mg/dL (ref 0.50–1.10)
GFR calc Af Amer: >90 mL/min (ref >90)
GFR calc non Af Amer: 86 mL/min — ABNORMAL LOW (ref >90)
Glucose, Bld: 125 mg/dL — ABNORMAL HIGH (ref 70–99)
Potassium: 3 mEq/L — ABNORMAL LOW (ref 3.5–5.1)
Sodium: 142 mEq/L (ref 135–145)
Total Bilirubin: 0.4 mg/dL (ref 0.3–1.2)
Total Protein: 5.8 g/dL — ABNORMAL LOW (ref 6.0–8.3)

## 2013-01-14 LAB — GLUCOSE, CAPILLARY: Glucose-Capillary: 97 mg/dL (ref 70–99)

## 2013-01-14 LAB — URINE CULTURE

## 2013-01-14 MED ORDER — CEFUROXIME AXETIL 250 MG PO TABS
250.0000 mg | ORAL_TABLET | Freq: Two times a day (BID) | ORAL | Status: DC
  Administered 2013-01-14 – 2013-01-15 (×3): 250 mg via ORAL
  Filled 2013-01-14 (×5): qty 1

## 2013-01-14 NOTE — Evaluation (Signed)
Physical Therapy Evaluation Patient Details Name: Jacqueline [name] MRN: [id number] DOB: 05/24/1928 Today's Date: 01/14/2013 Time: 1208-1223 PT Time Calculation (min): 15 min  PT Assessment / Plan / Recommendation History of Present Illness  Pt is an 79 y.o. female adm due to weakness, found to have UTI, wound on dorsum of R hand  Clinical Impression  Pt admitted with above. Pt currently with functional limitations due to the deficits listed below (see PT Problem List).  Pt will benefit from skilled PT to increase their independence and safety with mobility to allow discharge to the venue listed below.       PT Assessment  Patient needs continued PT services    Follow Up Recommendations  Home health PT;Supervision - Intermittent    Does the patient have the potential to tolerate intense rehabilitation      Barriers to Discharge Decreased caregiver support Does not have 24 hour assist, but her adult children check in on her daily; It is highly likely she will be able to progress to being independent     Equipment Recommendations  None recommended by PT    Recommendations for Other Services     Frequency Min 3X/week    Precautions / Restrictions Precautions Precautions: Fall   Pertinent Vitals/Pain no apparent distress       Mobility  Bed Mobility Bed Mobility: Supine to Sit;Sitting - Scoot to Edge of Bed Supine to Sit: 4: Min assist Sitting - Scoot to [place] of Bed: 4: Min assist Details for Bed Mobility Assistance: cues for technique Transfers Transfers: Sit to Stand;Stand to Sit Sit to Stand: 4: Min assist;From bed;With upper extremity assist Stand to Sit: 4: Min guard;To chair/3-in-1 Details for Transfer Assistance: Pt was able to catch herself pulling up on RW and correct herself for safer hand placement Ambulation/Gait Ambulation/Gait Assistance: 4: Min guard (without physical contact) Ambulation Distance (Feet): 6 Feet Assistive device: Rolling walker Ambulation/Gait  Assistance Details: Cues for posture; noted dependence on UE support Gait Pattern: Step-through pattern;Decreased stride length    Exercises     PT Diagnosis: Generalized weakness  PT Problem List: Decreased strength;Decreased activity tolerance;Decreased balance;Decreased mobility;Decreased knowledge of use of DME PT Treatment Interventions: DME instruction;Gait training;Functional mobility training;Therapeutic activities;Therapeutic exercise;Balance training;Patient/family education     PT Goals(Current goals can be found in the care plan section) Acute Rehab PT Goals Patient Stated Goal: get better and go home PT Goal Formulation: With patient Time For Goal Achievement: 01/28/13 Potential to Achieve Goals: Good  Visit Information  Last PT Received On: 01/14/13 Assistance Needed: +1 History of Present Illness: Pt is an 79 y.o. female adm due to weakness, found to have UTI, wound on dorsum of R hand       Prior Functioning  Home Living Family/patient expects to be discharged to:: Private residence Living Arrangements: Alone Available Help at Discharge: Available PRN/intermittently Type of Home: House Home Access: Level entry Home Layout: One level Home Equipment: Walker - 4 wheels;Grab bars - tub/shower;Shower seat Additional Comments: pt states she uses 4-wheeled walker at times, has a bird that she takes care of Prior Function Level of Independence: Independent Communication Communication: No difficulties Dominant Hand: Right    Cognition  Cognition Arousal/Alertness: Awake/alert Behavior During Therapy: WFL for tasks assessed/performed Overall Cognitive Status: Within Functional Limits for tasks assessed    Extremity/Trunk Assessment Upper Extremity Assessment Upper Extremity Assessment: Overall WFL for tasks assessed (with noted wound dorsum of R hand) Lower Extremity Assessment Lower Extremity Assessment: Generalized weakness  Balance    End of Session PT -  End of Session Equipment Utilized During Treatment: Gait belt Activity Tolerance: Patient tolerated treatment well Patient left: in chair;with call bell/phone within reach Nurse Communication: Mobility status  GP     Van Clines [facility] [place], [place] [phone]  01/14/2013, 2:14 PM

## 2013-01-14 NOTE — Progress Notes (Signed)
UR COMPLETED  

## 2013-01-14 NOTE — Progress Notes (Signed)
Subjective: Jacqueline [name] has been admitted due to weakness and inability to walk secondary to UTI and an abscess on the dorsum of her hand. She has been on IV ceftriaxone to cover infection. The abscess was incised and drained in the ED at initial evaluation.   Jacqueline [name] is awake and very talkative. She is in no distress but remains weak.  Objective: Lab:  Recent Labs  01/12/13 2014 01/13/13 0528  WBC 13.4* 9.4  NEUTROABS 11.6* 7.7  HGB 14.3 12.6  HCT 41.3 36.5  MCV 99.3 99.2  PLT 167 166    Recent Labs  01/12/13 2014 01/13/13 0528  NA 138 143  K 3.6 3.2*  CL 100 107  GLUCOSE 104* 113*  BUN 30* 18  CREATININE 0.87 0.58  CALCIUM 9.8 9.1   Micro: urine culture pending  Imaging: 01/12/13 CXR: FINDINGS:  Large hiatal hernia. Mild cardiomegaly. No confluent opacities or  effusions. No acute bony abnormality. Degenerative changes in the  shoulders.  IMPRESSION:  Stable large hiatal hernia. No acute findings.   Scheduled Meds: . cefTRIAXone (ROCEPHIN)  IV  1 g Intravenous Q24H  . diltiazem  180 mg Oral BID  . influenza vac split quadrivalent PF  0.5 mL Intramuscular Tomorrow-1000  . metoprolol  50 mg Oral BID  . pantoprazole  40 mg Oral Daily  . potassium chloride SA  20 mEq Oral Daily  . rivaroxaban  15 mg Oral Q supper   Continuous Infusions: . sodium chloride 50 mL/hr at 01/14/13 0600   PRN Meds:.ALPRAZolam, HYDROcodone-acetaminophen   Physical Exam: Filed Vitals:   01/14/13 0612  BP: 161/81  Pulse: 73  Temp: 98 F (36.7 C)  Resp: 18    Intake/Output Summary (Last 24 hours) at 01/14/13 0715 Last data filed at 01/14/13 0600  Gross per 24 hour  Intake   2200 ml  Output      0 ml  Net   2200 ml   Gen'l- elderly white woman in no distress HEENT- [place]/AT, C&S clear Neck - supple Cor- IRIR rate controlled Pulm - normal respirations Abd- BS+, soft Neuro - A&O, speech clear, recall good. CN II-XII - normal facial symmetry, MS - able to sit up with 1+  assist. Did not not stand or walk Derm - wound on dorsum of right hand with some bleeding, no purulent material, mild erythema     Assessment/Plan: 1. ID - Day #3 Rocephin for UTI -cultures pending and for hand infection. Afebrile, WBC 13.4 to 9.4 Plan  Switch to ceftin 250 mg bid  Continue wound care  2. A. Fib - stable rate. Plan  Continue present medications including Xeralto  3. Weakness - does require some assistance to sit. Plan PT/OT eval prior to being able to d/c home  4. CAD - no chest pain   5. Lytes  K 9/14 slightly low Plan Oral replacement  Bmet pending. Pt on K-Dur     Illene Regulus [facility] (o) [phone]; (c) [phone] Call-grp - [facility]  Tele: [phone]  01/14/2013, 7:03 AM

## 2013-01-14 NOTE — Progress Notes (Signed)
Abscess on right hand remains reddened and swollen. No drainage present. Pt states it is painful. Area cleaned and new bandage applied.

## 2013-01-15 DIAGNOSIS — L02519 Cutaneous abscess of unspecified hand: Secondary | ICD-10-CM | POA: Diagnosis not present

## 2013-01-15 DIAGNOSIS — E876 Hypokalemia: Secondary | ICD-10-CM | POA: Diagnosis not present

## 2013-01-15 DIAGNOSIS — N390 Urinary tract infection, site not specified: Secondary | ICD-10-CM | POA: Diagnosis not present

## 2013-01-15 DIAGNOSIS — I4891 Unspecified atrial fibrillation: Secondary | ICD-10-CM | POA: Diagnosis not present

## 2013-01-15 LAB — GLUCOSE, CAPILLARY: Glucose-Capillary: 95 mg/dL (ref 70–99)

## 2013-01-15 MED ORDER — CEFUROXIME AXETIL 250 MG PO TABS: 250.0000 mg | ORAL_TABLET | Freq: Two times a day (BID) | ORAL | Status: DC

## 2013-01-15 MED ORDER — POTASSIUM CHLORIDE CRYS ER 20 MEQ PO TBCR
40.0000 meq | EXTENDED_RELEASE_TABLET | Freq: Once | ORAL | Status: AC
  Administered 2013-01-15: 40 meq via ORAL
  Filled 2013-01-15: qty 2

## 2013-01-15 NOTE — Progress Notes (Signed)
01/15/13 Spoke with patient about HHC, she selected [facility] which she worked with in the past. Dava Najjar with [facility] and set up HHPT and RN, No equipment needs identified. Patient states that her daughter will be staying with her after d/c. Jacquelynn Cree RN, BSN, CCM

## 2013-01-15 NOTE — Discharge Summary (Signed)
Jacqueline [name], HARPHAM NO.:  [id number]  MEDICAL RECORD NO.:  [id number]  LOCATION:  [id number]                        FACILITY:  [facility]  PHYSICIAN:  Rosalyn Gess. Shirel Mallis, MD  DATE OF BIRTH:  1928-08-06  DATE OF ADMISSION:  01/12/2013 DATE OF DISCHARGE:  01/15/2013                              DISCHARGE SUMMARY   ADMITTING DIAGNOSES: 1. Urinary tract infection with weakness. 2. Atrial fibrillation, stable. 3. Hypertension. 4. Gastroesophageal reflux disease.  DISCHARGE DIAGNOSES: 1. Urinary tract infection with weakness. 2. Atrial fibrillation, stable. 3. Hypertension. 4. Gastroesophageal reflux disease.  CONSULTANTS:  None.  PROCEDURES:  Imaging:  Chest x-ray from January 12, 2013, showed stable large hiatal hernia with no acute findings.  HISTORY OF PRESENT ILLNESS:  [name] is an 79 year old woman with history of TIA, atrial fibrillation on Xarelto, hypertension, GI bleed, and recent CVA.  The patient was at her baseline one week ago.  For the last few days, she has been having progressive lethargy and weakness. She also complained of dysuria for the last 3 days.  On the day of admission, she went to her bathroom and following micturition was unable to stand up from the commode because of weakness.  She had a life alert and signaled for help.  Her daughter was contacted.  A locksmith was required to enter her home.  The patient was found to be awake and alert.  She had had some nausea but no vomiting.  She denied any fever, chills, or headache.  She was brought to the emergency department for evaluation and was subsequently admitted with UTI and weakness.  Please see the H and P, as well as previous records for past medical history, family history, social history, and physical exam at admission.  HOSPITAL COURSE: 1. UTI.  The patient had been started on Rocephin at the time of     admission.  This was converted after 3 days to oral Ceftin 250     b.i.d.   The patient's initial white count was 13,400 and on     followup was down to 9,400.  The patient remained afebrile.  Urine     culture from January 12, 2013, grew out greater than 100,000     colonies.  Speciation is pending at the time of discharge     dictation.  With the patient remaining afebrile, with her having no     complaints of dysuria, she is ready for discharge home on current     antibiotic therapy for an additional 5 days. 2. Atrial fibrillation.  The patient remained stable with a controlled     rate, and she is continued on Xarelto. 3. Weakness.  The patient's strength did improve.  She was seen by     Physical Therapy on January 14, 2013.  It was recommended that     the patient have home health PT as well as intermittent     supervision.  She reports she has been able to ambulate in her     room.  At this point, she will be ready for discharge and will     renew her home health referral. 4. Coronary  artery disease.  The patient was stable with no chest pain     or chest discomfort. 5. Electrolyte.  The patient's potassium has declined.  She will be     given 40 mEq of potassium prior to discharge, with instructions to     increase her potassium at home for several days.  DISCHARGE EXAMINATION:  VITAL SIGNS:  Temperature was 98.9, blood pressure 140/67, heart rate 71, respirations 14, oxygen saturations 100%. GENERAL APPEARANCE:  This is a pleasant elderly woman, in no acute distress. HEENT:  Normocephalic, atraumatic.  Conjunctivae and sclerae was clear. Pupils equal, round, and reactive. NECK:  Obese with no palpable thyromegaly. CARDIOVASCULAR:  2+ radial pulse.  Her precordium was quiet.  She had an irregularly irregular heart rate. PULMONARY:  The patient was moving air well with no rales, wheezes, or rhonchi.  No increased work of breathing. ABDOMEN:  Soft.  No guarding or rebound. NEURO:  The patient is awake, alert.  She is oriented to person,  place, time and context.  She moves all extremities to command.  She is sitting on the side of bed in no distress.  FINAL LABORATORY:  From January 14, 2013, sodium was 142, potassium 3, chloride 106, CO2 of 26, BUN 11, creatinine 0.54, glucose was 125. Liver functions were normal.  Albumin slightly low at 2.7.  CBC from January 13, 2013, with a white count of 9400, hemoglobin 12.6 g, platelets 166,000, differential with 82% segs, 10% lymphs, 7% monos.  DISCHARGE MEDICATIONS: 1. Alprazolam 0.125 mg 4 times daily as needed for anxiety. 2. Cefuroxime 250 mg b.i.d. for an additional 5 days. 3. Clonidine 0.1 mg every 2 hours as needed for systolic blood     pressure greater than 160. 4. Diltiazem XR 180 mg p.o. daily. 5. Furosemide 20 mg p.o. daily. 6. Norco 7.25/325 0.5-1 tablet every 4 hours as needed for pain. 7. Lopressor 50 mg b.i.d. 8. Multivitamin daily. 9. Protonix 40 mg q.a.m. 10.Potassium 20 mEq twice a day for 3 days then resume it once a day. 11.Xarelto 15 mg p.o. q.p.m. 12.Tramadol 50 mg every 6 hours as needed for pain.  DISPOSITION:  The patient is discharged to home.  She will resume all of her home medications as noted above.  She will have ongoing physical therapy at home and intermittent supervision.  She does have a close sitter/friend who sees her daily and her family is very attentive.  The patient's condition at the time of discharge dictation is stable.  FOLLOWUP:  The patient will be seen in the office in 5-7 days.     Rosalyn Gess Paco Cislo, MD     MEN/MEDQ  D:  01/15/2013  T:  01/15/2013  Job:  [id number]

## 2013-01-15 NOTE — Progress Notes (Signed)
Physical Therapy Treatment Patient Details Name: Jacqueline [name] MRN: [id number] DOB: Oct 24, 1928 Today's Date: 01/15/2013 Time: 4098-1191 PT Time Calculation (min): 11 min  PT Assessment / Plan / Recommendation  History of Present Illness Pt is an 79 y.o. female adm due to weakness, found to have UTI, wound on dorsum of R hand   PT Comments   Good progress; Feels stronger, and is confident in her ability to manage at home; OK for dc from PT standpoint  Follow Up Recommendations  Home health PT;Supervision - Intermittent     Does the patient have the potential to tolerate intense rehabilitation     Barriers to Discharge        Equipment Recommendations  None recommended by PT    Recommendations for Other Services    Frequency Min 3X/week   Progress towards PT Goals Progress towards PT goals: Progressing toward goals  Plan Current plan remains appropriate    Precautions / Restrictions Precautions Precautions: None   Pertinent Vitals/Pain no apparent distress     Mobility  Transfers Transfers: Sit to Stand;Stand to Sit Sit to Stand: 5: Supervision Stand to Sit: 5: Supervision Details for Transfer Assistance: Smooth transition Ambulation/Gait Ambulation/Gait Assistance: 5: Supervision Ambulation Distance (Feet): 50 Feet Assistive device: Rolling walker Ambulation/Gait Assistance Details: Cues for RW proximity and posture Gait Pattern: Step-through pattern;Decreased stride length    Exercises     PT Diagnosis:    PT Problem List:   PT Treatment Interventions:     PT Goals (current goals can now be found in the care plan section) Acute Rehab PT Goals Patient Stated Goal: get better and go home PT Goal Formulation: With patient Time For Goal Achievement: 01/28/13 Potential to Achieve Goals: Good  Visit Information  Last PT Received On: 01/15/13 Assistance Needed: +1 History of Present Illness: Pt is an 79 y.o. female adm due to weakness, found to have UTI, wound on  dorsum of R hand    Subjective Data  Patient Stated Goal: get better and go home   Cognition  Cognition Arousal/Alertness: Awake/alert Behavior During Therapy: WFL for tasks assessed/performed Overall Cognitive Status: Within Functional Limits for tasks assessed    Balance     End of Session PT - End of Session Activity Tolerance: Patient tolerated treatment well Patient left: in chair;with call bell/phone within reach Nurse Communication: Mobility status   GP     Van Clines [facility] [place], [place] [phone]  01/15/2013, 12:32 PM

## 2013-01-15 NOTE — Progress Notes (Signed)
Patient discharged to home accompanied by daughter. Discharge instructions given and explained and patient/family stated understanding. IV was removed prior to discharge and patient left unit in a stable condition via wheelchair.

## 2013-01-15 NOTE — Evaluation (Signed)
Occupational Therapy Evaluation Patient Details Name: Jacqueline [name] MRN: [id number] DOB: May 26, 1928 Today's Date: 01/15/2013 Time: 5784-6962 OT Time Calculation (min): 14 min  OT Assessment / Plan / Recommendation History of present illness Pt is an 79 y.o. female adm due to weakness, found to have UTI, wound on dorsum of R hand   Clinical Impression   Pt doing well and is at sup level with ADLs and ADL mobility and safety. All education completed and no further acute OT services indicated at this time    OT Assessment  Patient does not need any further OT services    Follow Up Recommendations  No OT follow up    Barriers to Discharge      Equipment Recommendations  None recommended by OT    Recommendations for Other Services    Frequency       Precautions / Restrictions Precautions Precautions: None Restrictions Weight Bearing Restrictions: No   Pertinent Vitals/Pain No c/o pain    ADL  Grooming: Performed;Wash/dry hands;Wash/dry face;Brushing hair;Supervision/safety;Set up Where Assessed - Grooming: Unsupported standing Upper Body Bathing: Simulated;Supervision/safety;Set up Lower Body Bathing: Simulated;Supervision/safety;Set up Upper Body Dressing: Performed;Supervision/safety;Set up Lower Body Dressing: Performed;Supervision/safety;Set up Toilet Transfer: Performed;Supervision/safety Toilet Transfer Method: Sit to Barista: Regular height toilet;Raised toilet seat with arms (or 3-in-1 over toilet) Toileting - Clothing Manipulation and Hygiene: Performed;Supervision/safety Where Assessed - Engineer, mining and Hygiene: Standing Tub/Shower Transfer Method: Not assessed Equipment Used: Rolling walker ADL Comments: Pt states that she has ADL A/E at home, reviewed use of A/E with pt    OT Diagnosis:    OT Problem List:   OT Treatment Interventions:     OT Goals(Current goals can be found in the care plan section) Acute Rehab OT  Goals Patient Stated Goal: get better and go home  Visit Information  Last OT Received On: 01/15/13 Assistance Needed: +1 History of Present Illness: Pt is an 79 y.o. female adm due to weakness, found to have UTI, wound on dorsum of R hand       Prior Functioning     Home Living Family/patient expects to be discharged to:: Private residence Living Arrangements: Alone Available Help at Discharge: Available PRN/intermittently Type of Home: House Home Access: Level entry Home Layout: One level Home Equipment: Walker - 4 wheels;Grab bars - tub/shower;Shower seat Additional Comments: pt states she uses 4-wheeled walker at times, has a bird that she takes care of Prior Function Level of Independence: Independent Comments: Pt has not taken shower despite it being a walk in with a chair and grab bars due to fear of falling Communication Communication: No difficulties Dominant Hand: Right         Vision/Perception Vision - History Baseline Vision: Wears glasses only for reading Patient Visual Report: No change from baseline Perception Perception: Within Functional Limits   Cognition  Cognition Arousal/Alertness: Awake/alert Behavior During Therapy: WFL for tasks assessed/performed Overall Cognitive Status: Within Functional Limits for tasks assessed    Extremity/Trunk Assessment Upper Extremity Assessment Upper Extremity Assessment: Overall WFL for tasks assessed     Mobility Bed Mobility Bed Mobility: Not assessed Details for Bed Mobility Assistance: pt standing beside bed brushing hair upon entering room Transfers Sit to Stand: 5: Supervision Stand to Sit: 5: Supervision Details for Transfer Assistance: Smooth transition     Exercise     Balance Balance Balance Assessed: Yes Dynamic Standing Balance Dynamic Standing - Balance Support: No upper extremity supported;During functional activity Dynamic Standing - Level of  Assistance: 5: Stand by assistance   End  of Session OT - End of Session Equipment Utilized During Treatment: Rolling walker Activity Tolerance: Patient tolerated treatment well Patient left: in bed;Other (comment) (sitting EOB)  GO     Galen Manila 01/15/2013, 12:48 PM

## 2013-01-15 NOTE — Progress Notes (Signed)
Subjective: Feels stable and ready to go home  Objective: Lab:  Recent Labs  01/12/13 2014 01/13/13 0528  WBC 13.4* 9.4  NEUTROABS 11.6* 7.7  HGB 14.3 12.6  HCT 41.3 36.5  MCV 99.3 99.2  PLT 167 166    Recent Labs  01/12/13 2014 01/13/13 0528 01/14/13 0905  NA 138 143 142  K 3.6 3.2* 3.0*  CL 100 107 106  GLUCOSE 104* 113* 125*  BUN 30* 18 11  CREATININE 0.87 0.58 0.54  CALCIUM 9.8 9.1 9.2    Imaging:  Scheduled Meds: . cefUROXime  250 mg Oral BID WC  . diltiazem  180 mg Oral BID  . metoprolol  50 mg Oral BID  . pantoprazole  40 mg Oral Daily  . potassium chloride SA  20 mEq Oral Daily  . rivaroxaban  15 mg Oral Q supper   Continuous Infusions: . sodium chloride 50 mL/hr at 01/14/13 0600   PRN Meds:.ALPRAZolam, HYDROcodone-acetaminophen   Physical Exam: Filed Vitals:   01/14/13 2205  BP: 140/67  Pulse: 71  Temp: 98.9 F (37.2 C)  Resp: 14    See d./c summary    Assessment/Plan: For d/c home. Dictated # [id number] F/f for Home health done   Illene Regulus [facility] (o) [phone]; (c) [phone] Call-grp - [facility]  Tele: [phone]  01/15/2013, 7:49 AM

## 2013-02-01 ENCOUNTER — Other Ambulatory Visit: Payer: Self-pay | Admitting: Internal Medicine

## 2013-02-04 ENCOUNTER — Encounter: Payer: Self-pay | Admitting: Internal Medicine

## 2013-02-04 ENCOUNTER — Ambulatory Visit (INDEPENDENT_AMBULATORY_CARE_PROVIDER_SITE_OTHER): Payer: Medicare Other | Admitting: Internal Medicine

## 2013-02-04 VITALS — BP 104/72 | HR 91 | Temp 97.5°F | Wt 148.0 lb

## 2013-02-04 DIAGNOSIS — I10 Essential (primary) hypertension: Secondary | ICD-10-CM | POA: Diagnosis not present

## 2013-02-04 DIAGNOSIS — I25810 Atherosclerosis of coronary artery bypass graft(s) without angina pectoris: Secondary | ICD-10-CM | POA: Diagnosis not present

## 2013-02-04 DIAGNOSIS — F411 Generalized anxiety disorder: Secondary | ICD-10-CM

## 2013-02-04 MED ORDER — ALPRAZOLAM 0.25 MG PO TABS: 0.1250 mg | ORAL_TABLET | ORAL | Status: DC

## 2013-02-04 MED ORDER — DILTIAZEM HCL ER 180 MG PO CP24: 180.0000 mg | ORAL_CAPSULE | Freq: Two times a day (BID) | ORAL | Status: DC

## 2013-02-04 MED ORDER — IPRATROPIUM BROMIDE 0.06 % NA SOLN: 2.0000 | Freq: Three times a day (TID) | NASAL | Status: DC

## 2013-02-04 NOTE — Patient Instructions (Addendum)
You have recovered from you urinary tract infection and the antibiotics are out of your system. It is ok to have shoulder injections with Dr. Ranell Patrick.  You have atrial fibrillation on blood thinner. Your last visit to Dr. Shirlee Latch was Dec '13. I have asked him to have his staff schedule you for a follow up appointment. Please continue all your present heart medications.  Anxiety and alprazolam: you are insistent on continuing to take alprazolam 0.125 mg 8 times per day (q3 hr). You have been clearly and carefully informed that alprazolam, a benzodiazepine, is contra-indicated in the geriatric patient. 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 Hip fracture, SDH. You were encouraged to try Zoloft instead but stopped after a single dose. You remain adamant about taking alprazolam as you have been taking it for several years even knowing the risks as explained.   Rx for atrovent nasal spray has been sent in to [facility].

## 2013-02-04 NOTE — Progress Notes (Signed)
Subjective:    Patient ID: Jacqueline [name], female    DOB: 1928-07-06, 79 y.o.   MRN: [id number]  HPI Jacqueline [name] was recently hospitalized Sept 13-16, 2014 for weakness that was attributed to UTI. She had PT eval and was to have home health PT and supervision. She was treated with ceftin for her UTI with resolution of leukocytosis and improvement in her condition. Reviewed hospital records and final micro was mixed flora. She has completed her antibiotics and denies having any UTI symptoms.   Jacqueline [name] is insistent on continuing to take alprazolam 0.125 mg 8 times per day (q3 hr). She has been clearly and carefully informed that alprazolam, a benzodiazepine, is contra-indicated in the geriatric patient. 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 Hip fracture, SDH. She been encouraged to try Zoloft instead but stopped after a single dose. She is adamant about taking alprazolam as she has been taking it for several years even knowing the risks as explained.   She is wanting to restart Ipratropium Bromide 0.06% nasal spray.  She needs a refill on dilitazem 24 hr 180 mg bid.  Physical therapy was ordered at the time of discharge Sept 16th but she declined [facility] PT once returning home. She still has a friend who checks on her daily and is readily available.   PMH, FamHx and SocHx reviewed for any changes and relevance.  Current Outpatient Prescriptions on File Prior to Visit  Medication Sig Dispense Refill  . cefUROXime (CEFTIN) 250 MG tablet Take 1 tablet (250 mg total) by mouth 2 (two) times daily with a meal.  10 tablet  0  . cloNIDine (CATAPRES) 0.1 MG tablet Take 1 tablet (0.1 mg total) by mouth every 2 (two) hours as needed (take as needed for a  Blood pressure that is greater than 160 on top, or bottom number greater than 100.).  30 tablet  3  . diltiazem (DILACOR XR) 180 MG 24 hr capsule Take 180 mg by mouth 2 (two) times daily.       . furosemide (LASIX) 20 MG tablet Take 20 mg by mouth daily.      Marland Kitchen HYDROcodone-acetaminophen (NORCO) 7.5-325 MG per tablet Take 0.5-1 tablets by mouth every 4 (four) hours as needed for pain.  180 tablet  0  . metoprolol (LOPRESSOR) 50 MG tablet Take 1 tablet (50 mg total) by mouth 2 (two) times daily.  180 tablet  3  . Multiple Vitamins-Minerals (CENTRUM SILVER PO) Take 1 tablet by mouth daily.      . pantoprazole (PROTONIX) 40 MG tablet TAKE 1 TABLET EVERY MORNING  90 tablet  2  . potassium chloride SA (K-DUR,KLOR-CON) 20 MEQ tablet Take 20 mEq by mouth daily.      . Rivaroxaban (XARELTO) 15 MG TABS tablet Take 1 tablet (15 mg total) by mouth daily with supper.  90 tablet  3  . traMADol (ULTRAM) 50 MG tablet Take 50 mg by mouth every 6 (six) hours as needed for pain.       No current facility-administered medications on file prior to visit.     Review of Systems System review is negative for any constitutional, cardiac, pulmonary, GI or neuro symptoms or complaints other than as described in the HPI.     Objective:   Physical Exam Filed Vitals:   02/04/13 1455  BP: 104/72  Pulse: 91  Temp: 97.5 F (36.4 C)   BP Readings  from Last 3 Encounters:  02/04/13 104/72  01/14/13 140/67  12/04/12 170/104   Wt Readings from Last 3 Encounters:  02/04/13 148 lb (67.132 kg)  01/14/13 152 lb 11.2 oz (69.264 kg)  12/04/12 153 lb 6.4 oz (69.582 kg)   Cor - IRIR - runs of regular rate with intermittent IRIR with rapid rate Lungs clear Neuro - Awake and alert. Ambulate with a rolling walker.        Assessment & Plan:

## 2013-02-06 DIAGNOSIS — IMO0002 Reserved for concepts with insufficient information to code with codable children: Secondary | ICD-10-CM | POA: Diagnosis not present

## 2013-02-06 DIAGNOSIS — M25519 Pain in unspecified shoulder: Secondary | ICD-10-CM | POA: Diagnosis not present

## 2013-02-06 DIAGNOSIS — M1710 Unilateral primary osteoarthritis, unspecified knee: Secondary | ICD-10-CM | POA: Diagnosis not present

## 2013-02-12 ENCOUNTER — Ambulatory Visit (INDEPENDENT_AMBULATORY_CARE_PROVIDER_SITE_OTHER): Payer: Medicare Other | Admitting: Cardiology

## 2013-02-12 ENCOUNTER — Encounter: Payer: Self-pay | Admitting: Cardiology

## 2013-02-12 VITALS — BP 140/90 | HR 110 | Ht 62.0 in | Wt 149.0 lb

## 2013-02-12 DIAGNOSIS — I25810 Atherosclerosis of coronary artery bypass graft(s) without angina pectoris: Secondary | ICD-10-CM

## 2013-02-12 DIAGNOSIS — I4891 Unspecified atrial fibrillation: Secondary | ICD-10-CM | POA: Diagnosis not present

## 2013-02-12 DIAGNOSIS — I480 Paroxysmal atrial fibrillation: Secondary | ICD-10-CM

## 2013-02-12 DIAGNOSIS — I10 Essential (primary) hypertension: Secondary | ICD-10-CM

## 2013-02-12 DIAGNOSIS — I6350 Cerebral infarction due to unspecified occlusion or stenosis of unspecified cerebral artery: Secondary | ICD-10-CM | POA: Diagnosis not present

## 2013-02-12 DIAGNOSIS — I639 Cerebral infarction, unspecified: Secondary | ICD-10-CM

## 2013-02-12 LAB — BASIC METABOLIC PANEL
GFR: 71.84 mL/min (ref >60.00)
Potassium: 4.3 mEq/L (ref 3.5–5.1)
Sodium: 141 mEq/L (ref 135–145)

## 2013-02-12 MED ORDER — PRAVASTATIN SODIUM 40 MG PO TABS: 40.0000 mg | ORAL_TABLET | Freq: Every evening | ORAL | Status: DC

## 2013-02-12 MED ORDER — METOPROLOL SUCCINATE ER 50 MG PO TB24: 50.0000 mg | ORAL_TABLET | Freq: Two times a day (BID) | ORAL | Status: DC

## 2013-02-12 MED ORDER — RIVAROXABAN 20 MG PO TABS: 20.0000 mg | ORAL_TABLET | Freq: Every day | ORAL | Status: DC

## 2013-02-12 NOTE — Patient Instructions (Signed)
Stop Xarelto 15mg  daily. Start Xarelto 20mg  daily.   Stop metoprolol tartrate (lopressor). Start metoprolol succinate (Toprol XL)  50mg  two times a day.   Start pravachol 40mg  daily in the evening.   Your physician recommends that you have  lab work today--BMET.   Your physician recommends that you schedule a follow-up appointment in: 1 month with Dr Shirlee Latch.  Your physician recommends that you return for a FASTING lipid profile /liver profile in 2 months.

## 2013-02-12 NOTE — Progress Notes (Signed)
Patient ID: Jacqueline [name], female   DOB: [birth year], 79 y.o.   MRN: [id number] PCP: Dr. Debby Bud  79 yo with history of CVA, chronic diastolic CHF, and atrial fibrillation with RVR presents for cardiology followup.  Patient was admitted in 11/13 with atrial fibrillation with RVR to the 150s.  She was lightheaded and felt palpitations. In the hospital, she was noted to have a mildly elevated troponin level. She went back into NSR overnight.  She was started on diltiazem CD and Xarelto and sent home.  Given the elevated troponin, she had a Lexiscan Cardiolite in 12/13 that showed no ischemia or infarction.  Last echo in 7/14 showed EF 55-60%.    She is very limited at baseline by shoulder and knee OA.  She is relatively inactive and uses a walker to get around the house.  I have not seen her in a while.  Since I last saw her, she had a stroke in 7/14 (left cerebellar CVA manifesting as dizziness and imbalance).  She has been on Xarelto 15 mg daily and was taking this when she had the stroke.  She was hospitalized in 9/14 with a UTI.  Today, HR is high in the 110s.  She is in atrial fibrillation.  Apparently, since the last time I saw her about a year ago she went into atrial fibrillation and it has become chronic/permanent.  She does not feel palpitations. No chest pain.  She denies exertional dyspnea though she is not very active.  Also of note, LDL was very high in 7/14 (217).  She stopped simvastatin due to myalgias.  SBP at home has been in the 130s-140s.  She has not used clonidine.   ECG: atrial fibrillation with rate 110, left axis deviation.   Labs (11/13): K 4.1, creatinine 0.87, TSH normal, TnI max 0.66 Labs (7/14): LDL 217 Labs (9/14): K 3, creatinine 0.54  PMH: 1. CVA: Occipital posterior parietal region involvement on MRI in 7/10. Carotid dopplers (7/10) with no significant stenosis. The CVA was thought to be embolic and she was on coumadin for a period of time afterwards. She stopped coumadin due  to easy bleeding and general intolerance. Recurrent CVA in 7/14, this time left cerebellar with imbalance.  Carotid dopplers in 7/14 without significant stenosis.  CVA likely related to atrial fibrillation.  2. Diastolic CHF: Echo (11/13) with EF 60-65%, focal basal septal hypertrophy, mild MR< PA systolic pressure 39 mmHg. Echo (7/14) with EF 55-60%, mild LVH.  3. Allergic rhinitis  4. PUD: Gastric ulcer years ago while on Celebrex. On PPI now.  5. OA  6. Atrial fibrillation: Diagnosis 03/13/12.  Admitted with RVR and mildly elevated troponin.  Went back into NSR on her own at that time.  Atrial fibrillation recurred and is now chronic.  7. Hiatral hernia 8. Lexiscan Cardiolite (12/13) with EF 77%, no evidence for ischemia or infarction.   SH: Lives alone in [place].  Daughter lives in town.  Quit smoking 2007, before that 1 ppd x 40 years.   FH: Father died at 36 w cerebral hemorrhage.   ROS: All systems reviewed and negative except as per HPI.   Current Outpatient Prescriptions  Medication Sig Dispense Refill  . ALPRAZolam (XANAX) 0.25 MG tablet Take 0.5 tablets (0.125 mg total) by mouth every 3 (three) hours.  360 tablet  3  . cefUROXime (CEFTIN) 250 MG tablet Take 1 tablet (250 mg total) by mouth 2 (two) times daily with a meal.  10 tablet  0  .  cloNIDine (CATAPRES) 0.1 MG tablet Take 1 tablet (0.1 mg total) by mouth every 2 (two) hours as needed (take as needed for a  Blood pressure that is greater than 160 on top, or bottom number greater than 100.).  30 tablet  3  . diltiazem (DILACOR XR) 180 MG 24 hr capsule Take 1 capsule (180 mg total) by mouth 2 (two) times daily.  180 capsule  3  . furosemide (LASIX) 20 MG tablet Take 20 mg by mouth daily.      Marland Kitchen HYDROcodone-acetaminophen (NORCO) 7.5-325 MG per tablet Take 0.5-1 tablets by mouth every 4 (four) hours as needed for pain.  180 tablet  0  . ipratropium (ATROVENT) 0.06 % nasal spray Place 2 sprays into the nose 3 (three) times  daily.  45 mL  3  . Multiple Vitamins-Minerals (CENTRUM SILVER PO) Take 1 tablet by mouth daily.      . pantoprazole (PROTONIX) 40 MG tablet TAKE 1 TABLET EVERY MORNING  90 tablet  2  . potassium chloride SA (K-DUR,KLOR-CON) 20 MEQ tablet Take 20 mEq by mouth daily.      . traMADol (ULTRAM) 50 MG tablet Take 50 mg by mouth every 6 (six) hours as needed for pain.      . metoprolol succinate (TOPROL-XL) 50 MG 24 hr tablet Take 1 tablet (50 mg total) by mouth 2 (two) times daily. Take with or immediately following a meal.  60 tablet  3  . pravastatin (PRAVACHOL) 40 MG tablet Take 1 tablet (40 mg total) by mouth every evening.  30 tablet  3  . Rivaroxaban (XARELTO) 20 MG TABS tablet Take 1 tablet (20 mg total) by mouth daily.  30 tablet  3   No current facility-administered medications for this visit.    BP 140/90  Pulse 110  Ht 5\' 2"  (1.575 m)  Wt 67.586 kg (149 lb)  BMI 27.25 kg/m2 General: NAD Neck: Thick, no JVD, no thyromegaly or thyroid nodule.  Lungs: Clear to auscultation bilaterally with normal respiratory effort. CV: Nondisplaced PMI.  Heart tachy, irregular S1/S2, no S3/S4, no murmur.  No peripheral edema.  No carotid bruit.  Normal pedal pulses.  Abdomen: Soft, nontender, no hepatosplenomegaly, no distention.  Skin: Intact without lesions or rashes.  Neurologic: Alert and oriented x 3.  Psych: Normal affect. Extremities: No clubbing or cyanosis.   Assessment/Plan: 1. Atrial fibrillation: Atrial fibrillation now appears chronic.  Rate is high today in the 110s.  She does not feel it.  For now, would continue rate control/anticoagulation strategy given minimal symptoms (and it seems that she has been back in atrial fibrillation for a while now).  - Increase Xarelto to 20 mg daily.  Based on her GFR she should be on this dose (especially given CVA in 7/14).   - For better HR management, will stop metoprolol and start Toprol XL 50 mg bid.  - Continue diltiazem CD.   2. CAD:  Patient had mildly elevated troponin when admitted with afib/RVR in 11/13.  Lexiscan Cardiolite was normal. 3. HTN: OK.  She has not had to use prn Clonidine. Will accept SBP < 150.  With transition to Toprol XL, suspect BP control will be better.   4. Chronic diastolic CHF: Stable volume status on current Lasix, no changes. Repeat BMET today given hypokalemia on recent BMET.  5. Hyperlipidemia: LDL was very high in 7/14.  She had myalgias with simvastatin.  She will not take Crestor because her sister had a lot of  problems with it.  I will try her on pravastatin 40 mg daily with lipids/LFTs in 2 months.   [name] 02/12/2013

## 2013-03-04 ENCOUNTER — Encounter: Payer: Self-pay | Admitting: Neurology

## 2013-03-04 ENCOUNTER — Ambulatory Visit (INDEPENDENT_AMBULATORY_CARE_PROVIDER_SITE_OTHER): Payer: Medicare Other | Admitting: Neurology

## 2013-03-04 VITALS — BP 128/91 | HR 94 | Ht <= 58 in | Wt 151.0 lb

## 2013-03-04 DIAGNOSIS — I63219 Cerebral infarction due to unspecified occlusion or stenosis of unspecified vertebral arteries: Secondary | ICD-10-CM | POA: Diagnosis not present

## 2013-03-04 NOTE — Progress Notes (Signed)
[facility] [address] [place]. [place] [address] [phone]       OFFICE FOLLOW-UP NOTE  [name] Date of Birth:  1929/02/23 Medical Record Number:  [id number]   HPI:  41 year Caucasian lady seen for first office f/u after [facility] admission 11/29/12 for stroke.She presented with dizziness and gait imbalance for 2 days. CT head was unremarkable but MRI showed small left cerebellar infarct.MRA showed mild intracranial atheromatous changes.2DEcho showed normal EF without cardiac source of embolism.Total cholesterol was 332 and LDL was 217 mg 5.HbA1c was 5.3%.She was continued on xarelto for atrial birillation and states her balance and gait have improved.She was placed on crestor but was unable to tolerate due to myalgias and was switched 1 month ago by Dr Shirlee Latch to pravachol which also she was not able to tolerate due to side effects.She has been complaining of left knee swelling and pain.She wals with a wheeled walker and has not had no falls.She is tolerating xarelto with minor bruises but no falls.  ROS:   14 system review of systems is positive for easy bruising,easy bleeding  PMH:  Past Medical History  Diagnosis Date  . Allergy, unspecified not elsewhere classified   . Other dyspnea and respiratory abnormality   . Other diseases of lung, not elsewhere classified   . Anemia, unspecified   . Unspecified adverse effect of other drug, medicinal and biological substance(995.29)   . Blood in stool   . Hemorrhage of gastrointestinal tract, unspecified   . Hemorrhage of gastrointestinal tract, unspecified   . Personal history of peptic ulcer disease   . Anxiety state, unspecified   . Personal history of other diseases of digestive system   . Unspecified essential hypertension   . Complication of anesthesia     "hard to wake me up after colonoscopy" (03/13/12)  . H/O hiatal hernia   . GERD (gastroesophageal reflux disease)   . Unspecified paranoid state   . Atrial  fibrillation with rapid ventricular response 03/13/12  . Osteoarthrosis, unspecified whether generalized or localized, lower leg   . Degeneration of lumbar or lumbosacral intervertebral disc   . Degeneration of lumbar or lumbosacral intervertebral disc   . Unspecified cerebral artery occlusion with cerebral infarction 10/2008    "mini stroke" denies residual (03/13/12)    Social History:  History   Social History  . Marital Status: Widowed    Spouse Name: N/A    Number of Children: 4  . Years of Education: 12   Occupational History  . retired    Social History Main Topics  . Smoking status: Former Smoker -- 1.00 packs/day for 40 years    Types: Cigarettes    Quit date: 05/02/2002  . Smokeless tobacco: Never Used  . Alcohol Use: No  . Drug Use: No  . Sexual Activity: No   Other Topics Concern  . Not on file   Social History Narrative   HSG. Married: 69- 8 yrs/ divorced; married 1960- 1988/ widowed. 4 grandchildren. 2 daughters- 22, 88; 2 sons- 39, 77. Lives alone- independent ADLs- pet cockatiel.    Medications:   Current Outpatient Prescriptions on File Prior to Visit  Medication Sig Dispense Refill  . ALPRAZolam (XANAX) 0.25 MG tablet Take 0.5 tablets (0.125 mg total) by mouth every 3 (three) hours.  360 tablet  3  . cefUROXime (CEFTIN) 250 MG tablet Take 1 tablet (250 mg total) by mouth 2 (two) times daily with a meal.  10 tablet  0  .  cloNIDine (CATAPRES) 0.1 MG tablet Take 1 tablet (0.1 mg total) by mouth every 2 (two) hours as needed (take as needed for a  Blood pressure that is greater than 160 on top, or bottom number greater than 100.).  30 tablet  3  . diltiazem (DILACOR XR) 180 MG 24 hr capsule Take 1 capsule (180 mg total) by mouth 2 (two) times daily.  180 capsule  3  . furosemide (LASIX) 20 MG tablet Take 20 mg by mouth daily.      Marland Kitchen HYDROcodone-acetaminophen (NORCO) 7.5-325 MG per tablet Take 0.5-1 tablets by mouth every 4 (four) hours as needed for  pain.  180 tablet  0  . ipratropium (ATROVENT) 0.06 % nasal spray Place 2 sprays into the nose 3 (three) times daily.  45 mL  3  . metoprolol succinate (TOPROL-XL) 50 MG 24 hr tablet Take 1 tablet (50 mg total) by mouth 2 (two) times daily. Take with or immediately following a meal.  60 tablet  3  . Multiple Vitamins-Minerals (CENTRUM SILVER PO) Take 1 tablet by mouth daily.      . pantoprazole (PROTONIX) 40 MG tablet TAKE 1 TABLET EVERY MORNING  90 tablet  2  . potassium chloride SA (K-DUR,KLOR-CON) 20 MEQ tablet Take 20 mEq by mouth daily.      . pravastatin (PRAVACHOL) 40 MG tablet Take 1 tablet (40 mg total) by mouth every evening.  30 tablet  3  . Rivaroxaban (XARELTO) 20 MG TABS tablet Take 1 tablet (20 mg total) by mouth daily.  30 tablet  3  . traMADol (ULTRAM) 50 MG tablet Take 50 mg by mouth every 6 (six) hours as needed for pain.       No current facility-administered medications on file prior to visit.    Allergies:   Allergies  Allergen Reactions  . Metoclopramide Hcl Other (See Comments)     Tremors  . Penicillins Itching  . Aspirin Other (See Comments)    "gave me stomach aches; made me bleed" (03/13/12)    Physical Exam General: well developed, well nourished, seated, in no evident distress Head: head normocephalic and atraumatic. Orohparynx benign Neck: supple with no carotid or supraclavicular bruits Cardiovascular: regular rate and rhythm, no murmurs Musculoskeletal: no deformity. Skin:  no rash/petichiae Vascular:  Normal pulses all extremities Filed Vitals:   03/04/13 1446  BP: 128/91  Pulse: 94    Neurologic Exam Mental Status: Awake and fully alert. Oriented to place and time. Recent and remote memory intact. Attention span, concentration and fund of knowledge appropriate. Mood and affect appropriate.  Cranial Nerves: Fundoscopic exam reveals sharp disc margins. Pupils equal, briskly reactive to light. Extraocular movements full without nystagmus. Visual  fields full to confrontation. Hearing intact. Facial sensation intact. Face, tongue, palate moves normally and symmetrically.  Motor: Normal bulk and tone. Normal strength in all tested extremity muscles. Sensory.: intact to tough and pinprick and vibratory.  Coordination: Rapid alternating movements normal in all extremities. Finger-to-nose and heel-to-shin performed accurately bilaterally. Gait and Station: Arises from chair without difficulty. Stance is stooped with wide based. Gait demonstrates mild imbalance .   Reflexes: 1+ and symmetric. Toes downgoing.   NIHSS  0 Modified Rankin  1   ASSESSMENT: 72 year lady with left cerebellar infarct due to atrial fibrillation in July 2014. Vascular risk factors of Hyperlipidimia and atrial fibrillation    PLAN: Continue xarelto for stroke prevention and strict control of lipids with LDL goal below 100 mg%. She is  having statin intolerance and will discuss alternative to Pravachol with Dr Shirlee Latch at upcoming visit. Discuss left knee swelling with Dr Ranell Patrick at next visit.F/U with Heide Guile, NP in 3 months

## 2013-03-04 NOTE — Patient Instructions (Signed)
Continue xarelto for stroke prevention and strict control of lipids with LDL goal below 100 mg%. She is having statin intolerance and will discuss alternative to Pravachol with Dr Shirlee Latch at upcoming visit. Discuss left knee swelling with Dr Ranell Patrick at next visit.F/U with Heide Guile, NP in 3 months

## 2013-03-06 ENCOUNTER — Telehealth: Payer: Self-pay | Admitting: Cardiology

## 2013-03-06 NOTE — Telephone Encounter (Signed)
Pravastatin would be very unlikely to cause leg swelling.  If she is convinced this is causing her leg swelling, she could try atorvastatin 10 mg daily.

## 2013-03-06 NOTE — Telephone Encounter (Signed)
Pt aware of MD's recommendations. Pt is aware that Dr. Shirlee Latch recommends for her to start Atorvastatin 10 mg. Pt states she has tried many statins even Simvastatin which made her body ache, lower back pain and unable to walk just like the Pravachol is doing now. Pt states she will tried to correct her cholesterol with diet ,she tried one time before and it worked. Pt has a F/U appointment with Dr. Shirlee Latch on 03/19/13 she will talk to MD about it then.

## 2013-03-06 NOTE — Telephone Encounter (Signed)
Pt called because she states has not been able to tolerate statins. Pt is taking Pravachol 40 mg once in the evening. Pt left LE foot and ankle is swollen. Pt states has been keeping her leg up on a pillow when up, but her foot still swollen. Yesterday AM when she got up from bed; both  feet were red . Pt did not take the Pravachol medication yesterday evening. This morning the  swelling is considerably better. Pt would like for Dr. Shirlee Latch to know. Pt would like to know if there is  another medication that she can take instead of Pravachol.

## 2013-03-06 NOTE — Telephone Encounter (Signed)
New message    Lft foot is swollen.  Could it be the cholesterol medication?

## 2013-03-08 NOTE — Telephone Encounter (Signed)
Follow Up  Pt states her medication was changed and believes that it has caused he left leg to swell terribly/// requests a remedy to help decrease swelling// please call back.

## 2013-03-08 NOTE — Telephone Encounter (Signed)
SPOKE WITH  PT  CONTINUES TO C/O LEG  SWELLING NOT  SURE WHEN  PRAVASTATIN  WAS  STOPPED  WILL CONT TO  MONITOR  OVER  WEEKEND  AND IS ON  LASIX  20 MG   WILL CALL Monday IF  NO IMPROVEMENT  WISHES  NOT  TOP START  ATORVASTATIN AT THIS TIME WILL DISCUSS WITH DR  [facility]   ON 03-19-13  VISIT .Zack Seal

## 2013-03-14 ENCOUNTER — Telehealth: Payer: Self-pay | Admitting: Cardiology

## 2013-03-14 NOTE — Telephone Encounter (Signed)
New problem    Has questions regarding medication clarification.

## 2013-03-14 NOTE — Telephone Encounter (Signed)
Pt states she continues to have lower back pain, see phone note 03/04/13. She is not taking pravastatin currently. Her  back pain has not improved since she has been off pravastatin.  She does have a history of lower back pain for several years and is a patient of Dr Ranell Patrick.

## 2013-03-14 NOTE — Telephone Encounter (Signed)
I encouraged pt to call either Dr Ranell Patrick or her PCP for further recommendations for her lower back pain. Phone note is from 03/06/13 not 03/04/13. She agreed. She will discuss cholesterol treatment with Dr Shirlee Latch at time of appt 03/19/13

## 2013-03-15 ENCOUNTER — Encounter: Payer: Self-pay | Admitting: *Deleted

## 2013-03-19 ENCOUNTER — Ambulatory Visit: Payer: Medicare Other | Admitting: Cardiology

## 2013-03-19 ENCOUNTER — Telehealth: Payer: Self-pay

## 2013-03-19 NOTE — Telephone Encounter (Signed)
1st available OV - not today

## 2013-03-19 NOTE — Telephone Encounter (Signed)
Per Dr Debby Bud, please schedule patient.

## 2013-03-19 NOTE — Telephone Encounter (Signed)
Pt has an appt 03/21/13.

## 2013-03-19 NOTE — Telephone Encounter (Signed)
Phone call from patient's daughter, Aurther Loft, [phone] she states patient is declining. She does not feel like getting up to walk around. She's afraid to fall even with her walker. Swollen ankle. Patient is having a hard time concentrating. Please advise.

## 2013-03-20 ENCOUNTER — Telehealth: Payer: Self-pay | Admitting: *Deleted

## 2013-03-20 ENCOUNTER — Telehealth: Payer: Self-pay

## 2013-03-20 ENCOUNTER — Ambulatory Visit: Payer: Medicare Other | Admitting: Internal Medicine

## 2013-03-20 NOTE — Telephone Encounter (Signed)
Phone call from patient's daughter, Jacqueline [name] [phone] states patient cancelled the appt that was scheduled for her tomorrow. She states it's to cold outside and her hip joints have been bothersome. She has laryngitis. She also states it is not urgent that she come see you.

## 2013-03-20 NOTE — Telephone Encounter (Signed)
ok 

## 2013-03-20 NOTE — Telephone Encounter (Signed)
Spoke with Sheralyn Boatman, advised of MDs message

## 2013-03-20 NOTE — Telephone Encounter (Signed)
Jacqueline [name], pts daughter, called states pt has appoint on 11.20.14 with Dr Debby Bud, however pt does not feel like she is physically able to come to the appoint.  Further states since pt started the cholesterol medication pt has been having hip joint pain.  Please advise

## 2013-03-20 NOTE — Telephone Encounter (Signed)
May stop the cholesterol medication for 2 weeks to see if the pain goes away.

## 2013-03-21 ENCOUNTER — Ambulatory Visit: Payer: Medicare Other | Admitting: Internal Medicine

## 2013-04-01 ENCOUNTER — Ambulatory Visit (INDEPENDENT_AMBULATORY_CARE_PROVIDER_SITE_OTHER): Payer: Medicare Other | Admitting: Internal Medicine

## 2013-04-01 ENCOUNTER — Other Ambulatory Visit (INDEPENDENT_AMBULATORY_CARE_PROVIDER_SITE_OTHER): Payer: Medicare Other

## 2013-04-01 ENCOUNTER — Encounter: Payer: Self-pay | Admitting: Internal Medicine

## 2013-04-01 VITALS — BP 108/78 | HR 63

## 2013-04-01 DIAGNOSIS — D649 Anemia, unspecified: Secondary | ICD-10-CM

## 2013-04-01 DIAGNOSIS — R0609 Other forms of dyspnea: Secondary | ICD-10-CM

## 2013-04-01 DIAGNOSIS — I6350 Cerebral infarction due to unspecified occlusion or stenosis of unspecified cerebral artery: Secondary | ICD-10-CM

## 2013-04-01 DIAGNOSIS — E876 Hypokalemia: Secondary | ICD-10-CM | POA: Diagnosis not present

## 2013-04-01 DIAGNOSIS — I10 Essential (primary) hypertension: Secondary | ICD-10-CM

## 2013-04-01 DIAGNOSIS — F411 Generalized anxiety disorder: Secondary | ICD-10-CM

## 2013-04-01 DIAGNOSIS — I872 Venous insufficiency (chronic) (peripheral): Secondary | ICD-10-CM

## 2013-04-01 DIAGNOSIS — M1990 Unspecified osteoarthritis, unspecified site: Secondary | ICD-10-CM

## 2013-04-01 DIAGNOSIS — I4891 Unspecified atrial fibrillation: Secondary | ICD-10-CM

## 2013-04-01 DIAGNOSIS — I639 Cerebral infarction, unspecified: Secondary | ICD-10-CM

## 2013-04-01 DIAGNOSIS — R0989 Other specified symptoms and signs involving the circulatory and respiratory systems: Secondary | ICD-10-CM | POA: Diagnosis not present

## 2013-04-01 DIAGNOSIS — K922 Gastrointestinal hemorrhage, unspecified: Secondary | ICD-10-CM

## 2013-04-01 LAB — HEPATIC FUNCTION PANEL
AST: 84 U/L — ABNORMAL HIGH (ref 0–37)
Albumin: 3.4 g/dL — ABNORMAL LOW (ref 3.5–5.2)

## 2013-04-01 LAB — COMPREHENSIVE METABOLIC PANEL
ALT: 68 U/L — ABNORMAL HIGH (ref 0–35)
CO2: 31 mEq/L (ref 19–32)
Calcium: 9.7 mg/dL (ref 8.4–10.5)
Chloride: 95 mEq/L — ABNORMAL LOW (ref 96–112)
GFR: 50.45 mL/min — ABNORMAL LOW (ref >60.00)
Potassium: 3.8 mEq/L (ref 3.5–5.1)
Sodium: 136 mEq/L (ref 135–145)
Total Bilirubin: 0.7 mg/dL (ref 0.3–1.2)
Total Protein: 6.8 g/dL (ref 6.0–8.3)

## 2013-04-01 LAB — HEMOGLOBIN AND HEMATOCRIT, BLOOD: Hemoglobin: 13.1 g/dL (ref 12.0–15.0)

## 2013-04-01 NOTE — Progress Notes (Signed)
Subjective:    Patient ID: Jacqueline [name], female    DOB: [birth year], 79 y.o.   MRN: [id number]  HPI Mrs. Heiny presents for follow up. In the interval since her hospital d/c for CVA she has seen Dr. Pearlean Brownie - she is doing OK and was to continue Xeralto and work in cholesterol levels. She has stopped pravastatin due to back and hip pain along with swelling of the left distal LE. These symptoms have not resolved since stopping medication. Of note she has had epidural steroid injections for lumbar spine disease by Dr/. Ramos.   She does have edema left LE. This does seem better in the AM and the swelling worsens during the day. She has no h/o renal failure and no h/o heart failure with last echo July '14 with EF 55-60%  She does complain of poor circulation. She does have a history GI bleeds and anemia but denies and hematochezia or other signs of GI blood loss. She feels weak, light-headed and she reports being very thirsty all the time.    Past Medical History  Diagnosis Date  . Allergy, unspecified not elsewhere classified   . Other dyspnea and respiratory abnormality   . Other diseases of lung, not elsewhere classified   . Anemia, unspecified   . Unspecified adverse effect of other drug, medicinal and biological substance(995.29)   . Blood in stool   . Hemorrhage of gastrointestinal tract, unspecified   . Personal history of peptic ulcer disease   . Anxiety state, unspecified   . Personal history of other diseases of digestive system   . Unspecified essential hypertension   . Complication of anesthesia     "hard to wake me up after colonoscopy" (03/13/12)  . H/O hiatal hernia   . GERD (gastroesophageal reflux disease)   . Unspecified paranoid state   . Atrial fibrillation with rapid ventricular response 03/13/12  . Osteoarthrosis, unspecified whether generalized or localized, lower leg   . Degeneration of lumbar or lumbosacral intervertebral disc   . Unspecified cerebral artery  occlusion with cerebral infarction 10/2008    "mini stroke" denies residual (03/13/12)  . History of IBS    Past Surgical History  Procedure Laterality Date  . Pilonidal cyst / sinus excision  1957  . Dilation and curettage of uterus  1950's?  . Shoulder arthroscopy  ~ 2000    right   Family History  Problem Relation Age of Onset  . Diabetes Mother   . CVA Mother   . CVA Father    History   Social History  . Marital Status: Widowed    Spouse Name: N/A    Number of Children: 4  . Years of Education: 12   Occupational History  . retired    Social History Main Topics  . Smoking status: Former Smoker -- 1.00 packs/day for 40 years    Types: Cigarettes    Quit date: 05/02/2002  . Smokeless tobacco: Never Used  . Alcohol Use: No  . Drug Use: No  . Sexual Activity: No   Other Topics Concern  . Not on file   Social History Narrative   HSG. Married: 87- 8 yrs/ divorced; married 1960- 1988/ widowed. 4 grandchildren. 2 daughters- 36, 66; 2 sons- 5, 45. Lives alone- independent ADLs- pet cockatiel.    Current Outpatient Prescriptions on File Prior to Visit  Medication Sig Dispense Refill  . ALPRAZolam (XANAX) 0.25 MG tablet Take 0.5 tablets (0.125 mg total) by mouth every 3 (three) hours.  360 tablet  3  . cefUROXime (CEFTIN) 250 MG tablet Take 1 tablet (250 mg total) by mouth 2 (two) times daily with a meal.  10 tablet  0  . cloNIDine (CATAPRES) 0.1 MG tablet Take 1 tablet (0.1 mg total) by mouth every 2 (two) hours as needed (take as needed for a  Blood pressure that is greater than 160 on top, or bottom number greater than 100.).  30 tablet  3  . diltiazem (DILACOR XR) 180 MG 24 hr capsule Take 1 capsule (180 mg total) by mouth 2 (two) times daily.  180 capsule  3  . furosemide (LASIX) 20 MG tablet Take 20 mg by mouth daily.      Marland Kitchen HYDROcodone-acetaminophen (NORCO) 7.5-325 MG per tablet Take 0.5-1 tablets by mouth every 4 (four) hours as needed for pain.  180  tablet  0  . ipratropium (ATROVENT) 0.06 % nasal spray Place 2 sprays into the nose 3 (three) times daily.  45 mL  3  . metoprolol succinate (TOPROL-XL) 50 MG 24 hr tablet Take 1 tablet (50 mg total) by mouth 2 (two) times daily. Take with or immediately following a meal.  60 tablet  3  . Multiple Vitamins-Minerals (CENTRUM SILVER PO) Take 1 tablet by mouth daily.      . pantoprazole (PROTONIX) 40 MG tablet TAKE 1 TABLET EVERY MORNING  90 tablet  2  . potassium chloride SA (K-DUR,KLOR-CON) 20 MEQ tablet Take 20 mEq by mouth daily.      . pravastatin (PRAVACHOL) 40 MG tablet Take 1 tablet (40 mg total) by mouth every evening.  30 tablet  3  . Rivaroxaban (XARELTO) 20 MG TABS tablet Take 1 tablet (20 mg total) by mouth daily.  30 tablet  3  . traMADol (ULTRAM) 50 MG tablet Take 50 mg by mouth every 6 (six) hours as needed for pain.       No current facility-administered medications on file prior to visit.     Review of Systems System review is negative for any constitutional, cardiac, pulmonary, GI or neuro symptoms or complaints other than as described in the HPI.     Objective:   Physical Exam Filed Vitals:   04/01/13 1359  BP: 108/78  Pulse: 63   Wt Readings from Last 3 Encounters:  03/04/13 151 lb (68.493 kg)  02/12/13 149 lb (67.586 kg)  02/04/13 148 lb (67.132 kg)   Gen'l - pleasant woman looking older than her stated age.  HEENT- left EAC with cerumen impaction. Cor  1+ radial pulse, poor capillary refill. IRIR rate with frequent PVCs. Pulm - normal breath sounds w/o rales or wheezes. O2 sat 97% RA Ext - no deformity. LE edema - left.       Assessment & Plan:  Hearing loss - resolved with irrigation and removal of cerumen impaction left ear.

## 2013-04-01 NOTE — Progress Notes (Signed)
Pre visit review using our clinic review tool, if applicable. No additional management support is needed unless otherwise documented below in the visit note. 

## 2013-04-01 NOTE — Patient Instructions (Signed)
Good to see you  Stroke - per Dr. Pearlean Brownie you are doing OK. Please get the Xeralto dose correct - 20 mg once a day.  Hearing loss - wax impaction in the left ear. Hopefully better after irrigation.  Swelling left leg: no evidence of heart or kidney failure. This is most likely venous insufficiency - -poor venous return circulation. Plan  Elevate legs  Knee high support hose.  Shortness of breath - lungs are clear on exam, no signs of fluid build up Plan  will check blood counts and a blood test BNP, for heart failure  Pallor of the hands, weakness and shortness of breath -  Plan  will check blood counts.  Hip and back pain - please discuss with Dr. Ranell Patrick: you may need repeat hip x-rays and/or back x-rays to identify the source of your pain. It is NOT cholesterol medication.  High Cholesterol - this is a problem we need to address. Will defer to Dr. Shirlee Latch or the cardiology lipid clinic.  Venous Stasis and Chronic Venous Insufficiency As people age, the veins located in their legs may weaken and stretch. When veins weaken and lose the ability to pump blood effectively, the condition is called chronic venous insufficiency (CVI) or venous stasis. Almost all veins return blood back to the heart. This happens by:  The force of the heart pumping fresh blood pushes blood back to the heart.  Blood flowing to the heart from the force of gravity. In the deep veins of the legs, blood has to fight gravity and flow upstream back to the heart. Here, the leg muscles contract to pump blood back toward the heart. Vein walls are elastic, and many veins have small valves that only allow blood to flow in one direction. When leg muscles contract, they push inward against the elastic vein walls. This squeezes blood upward, opens the valves, and moves blood toward the heart. When leg muscles relax, the vein wall also relaxes and the valves inside the vein close to prevent blood from flowing backward. This method  of pumping blood out of the legs is called the venous pump. CAUSES  The venous pump works best while walking and leg muscles are contracting. But when a person sits or stands, blood pressure in leg veins can build. Deep veins are usually able to withstand short periods of inactivity, but long periods of inactivity (and increased pressure) can stretch, weaken, and damage vein walls. High blood pressure can also stretch and damage vein walls. The veins may no longer be able to pump blood back to the heart. Venous hypertension (high blood pressure inside veins) that lasts over time is a primary cause of CVI. CVI can also be caused by:   Deep vein thrombosis, a condition where a thrombus (blood clot) blocks blood flow in a vein.  Phlebitis, an inflammation of a superficial vein that causes a blood clot to form. Other risk factors for CVI may include:   Heredity.  Obesity.  Pregnancy.  Sedentary lifestyle.  Smoking.  Jobs requiring long periods of standing or sitting in one place.  Age and gender:  Women in their 75's and 31's and men in their 35's are more prone to developing CVI. SYMPTOMS  Symptoms of CVI may include:   Varicose veins.  Ulceration or skin breakdown.  Lipodermatosclerosis, a condition that affects the skin just above the ankle, usually on the inside surface. Over time the skin becomes brown, smooth, tight and often painful. Those with this condition  have a high risk of developing skin ulcers.  Reddened or discolored skin on the leg.  Swelling. DIAGNOSIS  Your caregiver can diagnose CVI after performing a careful medical history and physical examination. To confirm the diagnosis, the following tests may also be ordered:   Duplex ultrasound.  Plethysmography (tests blood flow).  Venograms (x-ray using a special dye). TREATMENT The goals of treatment for CVI are to restore a person to an active life and to minimize pain or disability. Typically, CVI does not  pose a serious threat to life or limb, and with proper treatment most people with this condition can continue to lead active lives. In most cases, mild CVI can be treated on an outpatient basis with simple procedures. Treatment methods include:   Elastic compression socks.  Sclerotherapy, a procedure involving an injection of a material that "dissolves" the damaged veins. Other veins in the network of blood vessels take over the function of the damaged veins.  Vein stripping (an older procedure less commonly used).  Laser Ablation surgery.  Valve repair. HOME CARE INSTRUCTIONS   Elastic compression socks must be worn every day. They can help with symptoms and lower the chances of the problem getting worse, but they do not cure the problem.  Only take over-the-counter or prescription medicines for pain, discomfort, or fever as directed by your caregiver.  Your caregiver will review your other medications with you. SEEK MEDICAL CARE IF:   You are confused about how to take your medications.  There is redness, swelling, or increasing pain in the affected area.  There is a red streak or line that extends up or down from the affected area.  There is a breakdown or loss of skin in the affected area, even if the breakdown is small.  You develop an unexplained oral temperature above 102 F (38.9 C).  There is an injury to the affected area. SEEK IMMEDIATE MEDICAL CARE IF:   There is an injury and open wound to the affected area.  Pain is not adequately relieved with pain medication prescribed or becomes severe.  An oral temperature above 102 F (38.9 C) develops.  The foot/ankle below the affected area becomes suddenly numb or the area feels weak and hard to move. MAKE SURE YOU:   Understand these instructions.  Will watch your condition.  Will get help right away if you are not doing well or get worse. Document Released: 08/22/2006 Document Revised: 07/11/2011 Document  Reviewed: 10/30/2006 [facility] Patient Information 2014 [place], [place].

## 2013-04-02 DIAGNOSIS — I872 Venous insufficiency (chronic) (peripheral): Secondary | ICD-10-CM | POA: Insufficient documentation

## 2013-04-02 NOTE — Assessment & Plan Note (Signed)
Remains in a. Fib with controlled rate. She is asymptomatic.  Plan continue present medical regimen  Follow up with Dr. Shirlee Latch as scheduled.

## 2013-04-02 NOTE — Assessment & Plan Note (Signed)
Chronic problem for which she takes prn Xanax.

## 2013-04-02 NOTE — Assessment & Plan Note (Signed)
S/p CVA x 2. Currently on Xeralto. She has recently seen Dr. Pearlean Brownie and is considered stable. Elevated lipids remains a problem.

## 2013-04-02 NOTE — Assessment & Plan Note (Signed)
Jacqueline [name] is c/o hip pain with standing or walking to the point of limiting her activities. She has been followed by Dr. Ranell Patrick.  Plan Discuss hip pain with Dr. Ranell Patrick and he will order appropriate studies to determine origin of pain: DDD lumbar vs DJD hips.   For pain - continue hydrocodone

## 2013-04-02 NOTE — Assessment & Plan Note (Signed)
Chronic edema left leg with some variation during the day. No h/o heart failure or kidney failure  Plan Standard care: elevate legs, knee high support hose.   Patient education provided

## 2013-04-02 NOTE — Assessment & Plan Note (Signed)
Patient feeling week with pallor.  Lab Results  Component Value Date   HGB 13.1 04/01/2013   Fortunately Hgb is normal range.

## 2013-04-03 ENCOUNTER — Telehealth: Payer: Self-pay

## 2013-04-03 NOTE — Telephone Encounter (Signed)
Phone call from patient stating she was in Monday and would like her test results.   She also wants her Hydrocodone refilled to [facility].

## 2013-04-03 NOTE — Telephone Encounter (Signed)
Chemistry panel was normal except for mild elevation in liver functions - this may be related to the "Statin" medication. Recommend repeat lab in 3-4 weeks to monitor this but no change in medications.  Hgb was normal 13.1 g.  BNP normal - no sign of heart failure/fluid in the lungs  Ok to refill hydrocodone.

## 2013-04-04 DIAGNOSIS — M545 Low back pain, unspecified: Secondary | ICD-10-CM | POA: Diagnosis not present

## 2013-04-04 DIAGNOSIS — M25519 Pain in unspecified shoulder: Secondary | ICD-10-CM | POA: Diagnosis not present

## 2013-04-04 DIAGNOSIS — M1710 Unilateral primary osteoarthritis, unspecified knee: Secondary | ICD-10-CM | POA: Diagnosis not present

## 2013-04-04 MED ORDER — HYDROCODONE-ACETAMINOPHEN 7.5-325 MG PO TABS: 0.5000 | ORAL_TABLET | ORAL | Status: DC | PRN

## 2013-04-04 NOTE — Telephone Encounter (Signed)
Patient has been notified and script printed for Hydrocodone to be faxed to [facility]

## 2013-04-08 ENCOUNTER — Encounter: Payer: Self-pay | Admitting: Internal Medicine

## 2013-04-11 ENCOUNTER — Telehealth: Payer: Self-pay

## 2013-04-11 NOTE — Telephone Encounter (Signed)
Phone call from patient stating her Hydrocodone script needs to be mailed to [facility], not faxed. This was mailed today

## 2013-04-15 ENCOUNTER — Other Ambulatory Visit: Payer: Self-pay

## 2013-04-15 MED ORDER — ALPRAZOLAM 0.25 MG PO TABS: 0.1250 mg | ORAL_TABLET | ORAL | Status: DC

## 2013-04-15 NOTE — Telephone Encounter (Signed)
Patient called back again requesting her labs be mailed to her home.

## 2013-04-15 NOTE — Telephone Encounter (Signed)
Script has been faxed to [facility]

## 2013-04-15 NOTE — Telephone Encounter (Signed)
Ok for alprazolam. OK to mail her her labs if not done already

## 2013-04-15 NOTE — Telephone Encounter (Signed)
Phone call from patient stating a refill is needed for her on Alprazolam

## 2013-04-18 ENCOUNTER — Other Ambulatory Visit: Payer: Self-pay | Admitting: Internal Medicine

## 2013-04-18 ENCOUNTER — Telehealth: Payer: Self-pay | Admitting: Internal Medicine

## 2013-04-18 NOTE — Telephone Encounter (Signed)
Ok to renew hydrocodone Rx and mail to patient:

## 2013-04-18 NOTE — Telephone Encounter (Signed)
I called patient back and she states she talked to [facility] and she was told that her Alprazolam is being mailed. She was told the Hydrocodone has been cancelled but she states it still may be mailed. She will wait til the 1st of the year to call regarding they Hydrocodone. She thinks it will still be mailed. I asked her if she is sure and she states yes.

## 2013-04-18 NOTE — Telephone Encounter (Signed)
Pt request written Rx for hydrocodone. Drug store does not except electronic scrip. Please call pt if this is ok.

## 2013-04-22 ENCOUNTER — Telehealth: Payer: Self-pay | Admitting: Internal Medicine

## 2013-04-22 MED ORDER — HYDROCODONE-ACETAMINOPHEN 7.5-325 MG PO TABS: 0.5000 | ORAL_TABLET | ORAL | Status: DC | PRN

## 2013-04-22 NOTE — Telephone Encounter (Signed)
I called patient and let her know a script for Hydrocodone is being printed that she can pick up at the front desk.   She could not get the Hydrocodone script through [facility]

## 2013-04-22 NOTE — Telephone Encounter (Signed)
Pt hasn't gotten the hydrocodone RX.  She wants to pick one up.  She wanted to wait unit Jan, but has decided she needs it now.

## 2013-05-09 ENCOUNTER — Other Ambulatory Visit: Payer: Self-pay

## 2013-05-09 MED ORDER — TRAMADOL HCL 50 MG PO TABS: 50.0000 mg | ORAL_TABLET | Freq: Four times a day (QID) | ORAL | Status: DC | PRN

## 2013-05-09 NOTE — Telephone Encounter (Signed)
Tramadol script faxed to [facility]

## 2013-05-20 ENCOUNTER — Telehealth: Payer: Self-pay

## 2013-05-20 NOTE — Telephone Encounter (Signed)
After the first 21 days days patients should be changed to 20 mg Xarelto

## 2013-05-20 NOTE — Telephone Encounter (Signed)
Patient has been notified

## 2013-05-20 NOTE — Telephone Encounter (Signed)
Phone call from patient [phone] she would like to know if she should be on 15 mg or 20 mg of Xarelto. She states you placed her on 15 mg but Dr Aundra Dubin placed her on 20 mg. Please advise on clarification, thanks

## 2013-05-30 DIAGNOSIS — IMO0002 Reserved for concepts with insufficient information to code with codable children: Secondary | ICD-10-CM | POA: Diagnosis not present

## 2013-05-30 DIAGNOSIS — M1710 Unilateral primary osteoarthritis, unspecified knee: Secondary | ICD-10-CM | POA: Diagnosis not present

## 2013-05-30 DIAGNOSIS — M25519 Pain in unspecified shoulder: Secondary | ICD-10-CM | POA: Diagnosis not present

## 2013-06-06 ENCOUNTER — Ambulatory Visit: Payer: Medicare Other | Admitting: Nurse Practitioner

## 2013-06-10 ENCOUNTER — Other Ambulatory Visit: Payer: Self-pay | Admitting: *Deleted

## 2013-06-10 ENCOUNTER — Telehealth: Payer: Self-pay | Admitting: *Deleted

## 2013-06-10 DIAGNOSIS — I25810 Atherosclerosis of coronary artery bypass graft(s) without angina pectoris: Secondary | ICD-10-CM

## 2013-06-10 DIAGNOSIS — I10 Essential (primary) hypertension: Secondary | ICD-10-CM

## 2013-06-10 DIAGNOSIS — I4891 Unspecified atrial fibrillation: Secondary | ICD-10-CM

## 2013-06-10 MED ORDER — METOPROLOL SUCCINATE ER 50 MG PO TB24: 50.0000 mg | ORAL_TABLET | Freq: Two times a day (BID) | ORAL | Status: DC

## 2013-06-10 MED ORDER — RIVAROXABAN 15 MG PO TABS: 15.0000 mg | ORAL_TABLET | Freq: Every day | ORAL | Status: DC

## 2013-06-10 NOTE — Telephone Encounter (Signed)
Patient called for xarelto refill. She requests to stay on 15mg  qd as she feels and looks better on this dose vs the 20mg  dose. Per patient on the 20mg  her skin was bruised black and blue and her legs were swollen and she felt terrible. Please advise. Thanks, MI

## 2013-06-10 NOTE — Telephone Encounter (Signed)
Rx sent in

## 2013-06-10 NOTE — Telephone Encounter (Signed)
Refill the 15 mg daily.

## 2013-06-13 ENCOUNTER — Other Ambulatory Visit: Payer: Self-pay | Admitting: Cardiology

## 2013-06-19 ENCOUNTER — Other Ambulatory Visit: Payer: Self-pay

## 2013-06-19 MED ORDER — CLOBETASOL PROPIONATE 0.05 % EX CREA
TOPICAL_CREAM | CUTANEOUS | Status: DC
Start: 2013-06-19 — End: 2013-10-01

## 2013-06-19 NOTE — Telephone Encounter (Signed)
This would be a new script. I do not see on med list.

## 2013-07-08 ENCOUNTER — Telehealth: Payer: Self-pay | Admitting: Cardiology

## 2013-07-08 ENCOUNTER — Telehealth: Payer: Self-pay | Admitting: *Deleted

## 2013-07-08 NOTE — Telephone Encounter (Signed)
New message    Patient calling C/O xarelto - has made sore on body , bruise, itching.    Patient has appt on  4/8 . Should appt be move up or not ?

## 2013-07-08 NOTE — Telephone Encounter (Signed)
Patient phoned requesting personal recommendation for your replacement for not only herself, but her daughter as well.  CB# [phone]

## 2013-07-08 NOTE — Telephone Encounter (Signed)
Pt states she has been on Xarelto for more than 1 year. In addition to some bruising (she had with coumadin also) she has spots on her skin that develop red bumps. She is bad about scratching these spots. They become inflamed and irritated. It is intermittent and random. She has been using clobetasol cream given to her by Dr Linda Hedges. This seems to help. I advised pt to call Dr Linda Hedges for further recommendations if this does not continue to improve. I  advised pt it is important for her to take Xarelto due to her medical history. If she does not think she will be able to continue this she is going to call me back.

## 2013-07-08 NOTE — Telephone Encounter (Signed)
Dr John 

## 2013-07-09 NOTE — Telephone Encounter (Signed)
Phoned and notified patient of PCP's new PCP recommendation. Patient states they would prefer Dr. Alain Marion if you agree.  Please advise.

## 2013-07-09 NOTE — Telephone Encounter (Signed)
Consulted with Dr. Lindalou Hose is fine for their new PCP

## 2013-07-15 ENCOUNTER — Other Ambulatory Visit (INDEPENDENT_AMBULATORY_CARE_PROVIDER_SITE_OTHER): Payer: Medicare Other

## 2013-07-15 ENCOUNTER — Telehealth: Payer: Self-pay | Admitting: Internal Medicine

## 2013-07-15 ENCOUNTER — Telehealth: Payer: Self-pay | Admitting: Cardiology

## 2013-07-15 DIAGNOSIS — I639 Cerebral infarction, unspecified: Secondary | ICD-10-CM

## 2013-07-15 DIAGNOSIS — I4891 Unspecified atrial fibrillation: Secondary | ICD-10-CM

## 2013-07-15 DIAGNOSIS — I6350 Cerebral infarction due to unspecified occlusion or stenosis of unspecified cerebral artery: Secondary | ICD-10-CM | POA: Diagnosis not present

## 2013-07-15 DIAGNOSIS — D649 Anemia, unspecified: Secondary | ICD-10-CM

## 2013-07-15 LAB — PROTIME-INR
INR: 1.4 ratio — AB (ref 0.8–1.0)
Prothrombin Time: 14.5 s — ABNORMAL HIGH (ref 10.2–12.4)

## 2013-07-15 LAB — APTT: APTT: 34.7 s — AB (ref 21.7–28.8)

## 2013-07-15 NOTE — Telephone Encounter (Signed)
Patient called and states that this morning she noticed that the inside of her right ankle is black/dark gray. She has noticed spider veins in that ankle before. She called Dr. Claris Gladden office and was advised to see if Dr. Linda Hedges could see her today or if she could have orders sent to the lab. Please advise.

## 2013-07-15 NOTE — Telephone Encounter (Signed)
I reviewed this with Dr Aundra Dubin.

## 2013-07-15 NOTE — Telephone Encounter (Signed)
07/15/13  Pt showed up at office in regards to phone call from earlier today.  Pt wants to have lab orders put in to see if current blood thinner medication is making blood too thin.  Informed pt that I would send a message back for them and they said that they would just sit in lobby and wait for a response.

## 2013-07-15 NOTE — Telephone Encounter (Signed)
xeralto generally does not need to be monitored.  For peace of mind - PT/PTT is ordered and she can go to lab.

## 2013-07-15 NOTE — Telephone Encounter (Signed)
Spoke with patient. Pt states this morning she noticed inside of right ankle black/dark gray. She has noticed spider veins in that ankle before. Pt denies tingling/numbness in foot or toes.

## 2013-07-15 NOTE — Telephone Encounter (Signed)
Pt states she has been soaking that foot because she thought she had a splinter in her toe. She also notes tingling in her right hand that she has had for months. She gets injections in both shoulders regularly. She states her nailbeds look darker today. She has a history of GI bleed and anemia. She does note small amts of BRB in her stool occ that she says is related to hemorrhoids. Pt denies vision or speech problems/facial weakness/extremity weakness or feeling like she is going to pass out. I advised pt to call Dr Norins/PCP today for appt/recommendations.

## 2013-07-15 NOTE — Telephone Encounter (Signed)
I left message on patient's voicemail that she can go have lab work done.

## 2013-07-15 NOTE — Telephone Encounter (Signed)
New message      Pt is on xarelto.  Rt hand is numb (sometimes)  and fingernails are bruised looking.  Could this be the medication

## 2013-07-18 DIAGNOSIS — IMO0002 Reserved for concepts with insufficient information to code with codable children: Secondary | ICD-10-CM | POA: Diagnosis not present

## 2013-07-18 DIAGNOSIS — M1710 Unilateral primary osteoarthritis, unspecified knee: Secondary | ICD-10-CM | POA: Diagnosis not present

## 2013-07-18 DIAGNOSIS — M19019 Primary osteoarthritis, unspecified shoulder: Secondary | ICD-10-CM | POA: Diagnosis not present

## 2013-07-19 ENCOUNTER — Telehealth: Payer: Self-pay | Admitting: Cardiology

## 2013-07-22 ENCOUNTER — Other Ambulatory Visit: Payer: Self-pay

## 2013-07-22 MED ORDER — TRAMADOL HCL 50 MG PO TABS: 50.0000 mg | ORAL_TABLET | Freq: Four times a day (QID) | ORAL | Status: DC | PRN

## 2013-07-22 NOTE — Telephone Encounter (Signed)
Script for tramadol needs to be faxed to [facility] [phone]. Increased qty to 360 for 90 day supply since taken q6h

## 2013-08-05 NOTE — Assessment & Plan Note (Signed)
Will continue alprazolam with patient having been fulling informed of the associated risks.

## 2013-08-05 NOTE — Assessment & Plan Note (Signed)
BP Readings from Last 3 Encounters:  04/01/13 108/78  03/04/13 128/91  02/12/13 140/90   Refilled medication. She is to continue her present regimen

## 2013-08-07 ENCOUNTER — Encounter: Payer: Self-pay | Admitting: Cardiology

## 2013-08-07 ENCOUNTER — Ambulatory Visit (INDEPENDENT_AMBULATORY_CARE_PROVIDER_SITE_OTHER): Payer: Medicare Other | Admitting: Cardiology

## 2013-08-07 ENCOUNTER — Encounter: Payer: Self-pay | Admitting: *Deleted

## 2013-08-07 VITALS — BP 138/85 | HR 77 | Ht 62.0 in | Wt 146.0 lb

## 2013-08-07 DIAGNOSIS — I480 Paroxysmal atrial fibrillation: Secondary | ICD-10-CM

## 2013-08-07 DIAGNOSIS — L97509 Non-pressure chronic ulcer of other part of unspecified foot with unspecified severity: Secondary | ICD-10-CM

## 2013-08-07 DIAGNOSIS — I509 Heart failure, unspecified: Secondary | ICD-10-CM

## 2013-08-07 DIAGNOSIS — I4891 Unspecified atrial fibrillation: Secondary | ICD-10-CM

## 2013-08-07 DIAGNOSIS — R21 Rash and other nonspecific skin eruption: Secondary | ICD-10-CM

## 2013-08-07 DIAGNOSIS — I639 Cerebral infarction, unspecified: Secondary | ICD-10-CM

## 2013-08-07 DIAGNOSIS — I5032 Chronic diastolic (congestive) heart failure: Secondary | ICD-10-CM

## 2013-08-07 DIAGNOSIS — I25810 Atherosclerosis of coronary artery bypass graft(s) without angina pectoris: Secondary | ICD-10-CM | POA: Diagnosis not present

## 2013-08-07 DIAGNOSIS — I6350 Cerebral infarction due to unspecified occlusion or stenosis of unspecified cerebral artery: Secondary | ICD-10-CM

## 2013-08-07 DIAGNOSIS — L97519 Non-pressure chronic ulcer of other part of right foot with unspecified severity: Secondary | ICD-10-CM

## 2013-08-07 MED ORDER — EZETIMIBE 10 MG PO TABS: 10.0000 mg | ORAL_TABLET | Freq: Every day | ORAL | Status: DC

## 2013-08-07 NOTE — Patient Instructions (Signed)
Start Zetia 10mg  daily.   Your physician has requested that you have a lower  extremity arterial duplex. This test is an ultrasound of the arteries in the legs. It looks at arterial blood flow in the legs . Allow one hour for Lower Arterial scans. There are no restrictions or special instructions  You have been referred to Dr Mack Hook for evaluation of your rash.336- [id number].  Your physician recommends that you return for a FASTING lipid profile /liver profile 2 months.  Your physician wants you to follow-up in: 6 months with Dr Aundra Dubin. (October 2015). You will receive a reminder letter in the mail two months in advance. If you don't receive a letter, please call our office to schedule the follow-up appointment.

## 2013-08-08 DIAGNOSIS — I5032 Chronic diastolic (congestive) heart failure: Secondary | ICD-10-CM | POA: Insufficient documentation

## 2013-08-08 NOTE — Progress Notes (Signed)
Patient ID: Jacqueline [name], female   DOB: 1928/08/13, 80 y.o.   MRN: [id number] PCP: Dr. Linda Hedges  80 yo with history of CVA, chronic diastolic CHF, and atrial fibrillation with RVR presents for cardiology followup.  Patient was admitted in 11/13 with atrial fibrillation with RVR to the 150s.  She was lightheaded and felt palpitations. In the hospital, she was noted to have a mildly elevated troponin level. She went back into NSR overnight.  She was started on diltiazem CD and Xarelto and sent home.  Given the elevated troponin, she had a Lexiscan Cardiolite in 12/13 that showed no ischemia or infarction.  Last echo in 7/14 showed EF 55-60%.    She is very limited at baseline by shoulder and knee OA.  She is relatively inactive and uses a walker to get around the house.  I have not seen her in a while.  Since I last saw her, she had a stroke in 7/14 (left cerebellar CVA manifesting as dizziness and imbalance).  She has been on Xarelto 15 mg daily and was taking this when she had the stroke.  She was hospitalized in 9/14 with a UTI.    She is stable today.  She stopped pravastatin due to myalgias.  She is not very active and walks with a walker but denies exertional dyspnea with her usual activities around the house.  Weight is down 3 lbs. No recent falls.  No chest pain.  She is back in NSR today.  She does not notice tachypalpitations. She has a small ulceration on her lateral right foot.   ECG: NSR, left axis deviation.   Labs (11/13): K 4.1, creatinine 0.87, TSH normal, TnI max 0.66 Labs (7/14): LDL 217 Labs (9/14): K 3, creatinine 0.54 Labs (12/14): K 3.8, creatinine 1.1, BNP 160  PMH: 1. CVA: Occipital posterior parietal region involvement on MRI in 7/10. Carotid dopplers (7/10) with no significant stenosis. The CVA was thought to be embolic and she was on coumadin for a period of time afterwards. She stopped coumadin due to easy bleeding and general intolerance. Recurrent CVA in 7/14, this time left  cerebellar with imbalance.  Carotid dopplers in 7/14 without significant stenosis.  CVA likely related to atrial fibrillation.  2. Diastolic CHF: Echo (76/73) with EF 60-65%, focal basal septal hypertrophy, mild MR< PA systolic pressure 39 mmHg. Echo (7/14) with EF 55-60%, mild LVH.  3. Allergic rhinitis  4. PUD: Gastric ulcer years ago while on Celebrex. On PPI now.  5. OA  6. Atrial fibrillation: Paroxysmal.  Diagnosis 03/13/12.  Admitted with RVR and mildly elevated troponin.  Went back into NSR on her own at that time.   7. Hiatral hernia 8. Lexiscan Cardiolite (12/13) with EF 78%, no evidence for ischemia or infarction.  9. Hyperlipidemia: Myalgias with statins.   SH: Lives alone in [place].  Daughter lives in town.  Quit smoking 2007, before that 1 ppd x 40 years.   FH: Father died at 13 w cerebral hemorrhage.   ROS: All systems reviewed and negative except as per HPI.   Current Outpatient Prescriptions  Medication Sig Dispense Refill  . ALPRAZolam (XANAX) 0.25 MG tablet Take 0.5 tablets (0.125 mg total) by mouth every 3 (three) hours.  360 tablet  3  . cefUROXime (CEFTIN) 250 MG tablet Take 1 tablet (250 mg total) by mouth 2 (two) times daily with a meal.  10 tablet  0  . clobetasol cream (TEMOVATE) 0.05 % Apply to rash on left foot  two times a day  60 g  0  . cloNIDine (CATAPRES) 0.1 MG tablet Take 1 tablet (0.1 mg total) by mouth every 2 (two) hours as needed (take as needed for a  Blood pressure that is greater than 160 on top, or bottom number greater than 100.).  30 tablet  3  . diltiazem (DILACOR XR) 180 MG 24 hr capsule Take 1 capsule (180 mg total) by mouth 2 (two) times daily.  180 capsule  3  . furosemide (LASIX) 20 MG tablet Take 20 mg by mouth daily.      Marland Kitchen HYDROcodone-acetaminophen (NORCO) 7.5-325 MG per tablet Take 0.5-1 tablets by mouth every 4 (four) hours as needed.  180 tablet  0  . ipratropium (ATROVENT) 0.06 % nasal spray Place 2 sprays into the nose 3 (three)  times daily.  45 mL  3  . metoprolol succinate (TOPROL-XL) 50 MG 24 hr tablet TAKE 1 TABLET (50 MG TOTAL) BY MOUTH 2 (TWO) TIMES DAILY. TAKE WITH OR IMMEDIATELY FOLLOWING A MEAL.  60 tablet  1  . Multiple Vitamins-Minerals (CENTRUM SILVER PO) Take 1 tablet by mouth daily.      . pantoprazole (PROTONIX) 40 MG tablet TAKE 1 TABLET EVERY MORNING  90 tablet  2  . potassium chloride SA (K-DUR,KLOR-CON) 20 MEQ tablet TAKE 1 TABLET EVERY DAY  90 tablet  3  . Rivaroxaban (XARELTO) 15 MG TABS tablet Take 1 tablet (15 mg total) by mouth daily.  90 tablet  0  . traMADol (ULTRAM) 50 MG tablet Take 1 tablet (50 mg total) by mouth every 6 (six) hours as needed.  360 tablet  0  . ezetimibe (ZETIA) 10 MG tablet Take 1 tablet (10 mg total) by mouth daily.  30 tablet  3   No current facility-administered medications for this visit.    BP 138/85  Pulse 77  Ht 5\' 2"  (1.575 m)  Wt 66.225 kg (146 lb)  BMI 26.70 kg/m2 General: NAD Neck: Thick, no JVD, no thyromegaly or thyroid nodule.  Lungs: Clear to auscultation bilaterally with normal respiratory effort. CV: Nondisplaced PMI.  Heart regular S1/S2, +S4, no murmur.  1+ edema left ankle.  No carotid bruit.  Unable to palpate pedal pulses.  Abdomen: Soft, nontender, no hepatosplenomegaly, no distention.  Skin: Intact without lesions or rashes.  Neurologic: Alert and oriented x 3.  Psych: Normal affect. Extremities: No clubbing or cyanosis. Small ulceration right lateral foot.   Assessment/Plan: 1. Atrial fibrillation: Paroxysmal.  She is in NSR today.  - Based on her GFR and CVA in 7/14 while on Xarelto 15 mg daily, she should be taking 20 mg daily.  However, she tried this dose and says that she "cannot stand it."  Therefore, will leave her on Xarelto 15 mg daily.  - Continue Toprol XL and diltiazem CD.  2. CAD: Patient had mildly elevated troponin when admitted with afib/RVR in 11/13.  Lexiscan Cardiolite was normal. 3. HTN: BP controlled currently.   4.  Chronic diastolic CHF: Stable volume status on current Lasix, no changes.  5. Hyperlipidemia: LDL was very high in 7/14.  She had myalgias with simvastatin and pravastatin.  She will not take Crestor because her sister had a lot of problems with it.  I am going to put her on Zetia 10 mg daily.  Will get lipids/LFTs in 2 months.  6. Right foot small ulceration: Difficult to palpate pedal pulses.  I will get ABIs to assess circulation.    Jacqueline [name]  Jacqueline [name] 08/08/2013

## 2013-08-14 ENCOUNTER — Ambulatory Visit (HOSPITAL_COMMUNITY): Payer: Medicare Other | Attending: Internal Medicine | Admitting: Cardiology

## 2013-08-14 ENCOUNTER — Other Ambulatory Visit (HOSPITAL_COMMUNITY): Payer: Self-pay | Admitting: Cardiology

## 2013-08-14 ENCOUNTER — Other Ambulatory Visit (INDEPENDENT_AMBULATORY_CARE_PROVIDER_SITE_OTHER): Payer: Medicare Other

## 2013-08-14 DIAGNOSIS — I4891 Unspecified atrial fibrillation: Secondary | ICD-10-CM

## 2013-08-14 DIAGNOSIS — I739 Peripheral vascular disease, unspecified: Secondary | ICD-10-CM | POA: Diagnosis not present

## 2013-08-14 DIAGNOSIS — I2510 Atherosclerotic heart disease of native coronary artery without angina pectoris: Secondary | ICD-10-CM | POA: Insufficient documentation

## 2013-08-14 DIAGNOSIS — L97809 Non-pressure chronic ulcer of other part of unspecified lower leg with unspecified severity: Secondary | ICD-10-CM | POA: Diagnosis not present

## 2013-08-14 DIAGNOSIS — R21 Rash and other nonspecific skin eruption: Secondary | ICD-10-CM

## 2013-08-14 DIAGNOSIS — I10 Essential (primary) hypertension: Secondary | ICD-10-CM

## 2013-08-14 DIAGNOSIS — I25810 Atherosclerosis of coronary artery bypass graft(s) without angina pectoris: Secondary | ICD-10-CM | POA: Diagnosis not present

## 2013-08-14 DIAGNOSIS — I70219 Atherosclerosis of native arteries of extremities with intermittent claudication, unspecified extremity: Secondary | ICD-10-CM | POA: Diagnosis not present

## 2013-08-14 DIAGNOSIS — L98499 Non-pressure chronic ulcer of skin of other sites with unspecified severity: Secondary | ICD-10-CM | POA: Diagnosis not present

## 2013-08-14 DIAGNOSIS — I7389 Other specified peripheral vascular diseases: Secondary | ICD-10-CM

## 2013-08-14 DIAGNOSIS — Z951 Presence of aortocoronary bypass graft: Secondary | ICD-10-CM | POA: Diagnosis not present

## 2013-08-14 DIAGNOSIS — L97519 Non-pressure chronic ulcer of other part of right foot with unspecified severity: Secondary | ICD-10-CM

## 2013-08-14 LAB — LIPID PANEL
CHOLESTEROL: 234 mg/dL — AB (ref 0–200)
HDL: 75.7 mg/dL (ref >39.00)
LDL Cholesterol: 125 mg/dL — ABNORMAL HIGH (ref 0–99)
Total CHOL/HDL Ratio: 3
Triglycerides: 169 mg/dL — ABNORMAL HIGH (ref 0.0–149.0)
VLDL: 33.8 mg/dL (ref 0.0–40.0)

## 2013-08-14 LAB — HEPATIC FUNCTION PANEL
ALBUMIN: 3.5 g/dL (ref 3.5–5.2)
ALT: 16 U/L (ref 0–35)
AST: 18 U/L (ref 0–37)
Alkaline Phosphatase: 71 U/L (ref 39–117)
BILIRUBIN DIRECT: 0.1 mg/dL (ref 0.0–0.3)
TOTAL PROTEIN: 6.4 g/dL (ref 6.0–8.3)
Total Bilirubin: 1.1 mg/dL (ref 0.3–1.2)

## 2013-08-14 NOTE — Progress Notes (Signed)
Lower arterial doppler and duplex complete 

## 2013-08-19 ENCOUNTER — Other Ambulatory Visit: Payer: Self-pay | Admitting: *Deleted

## 2013-08-19 ENCOUNTER — Telehealth: Payer: Self-pay | Admitting: *Deleted

## 2013-08-19 NOTE — Telephone Encounter (Signed)
Message copied by Katrine Coho on Mon Aug 19, 2013  3:39 PM ------      Message from: Shirl Harris I      Created: Mon Aug 19, 2013  3:08 PM       I tried to schedule an appt to see Dr. Fletcher Anon, but, the patient has numerous appointments and can only be seen on Wednesday's or Thursday's.  Dr. Fletcher Anon doesn't have anything open on a Wednesday or Thursday through June.  She will call you back once she is done with her appointments this week.            [place]                  ----- Message -----         From: Katrine Coho, RN         Sent: 08/19/2013   2:49 PM           To: [facility]            Please call to schedule new pt appt with Dr Fletcher Anon per Dr Aundra Dubin ---Thanks.            Bilateral PT occlusions.  Right SFA short occlusion.  I suspect her right foot ulcer is related to ischemia.  I think she should have PV consult, refer to Dr. Fletcher Anon.       ------

## 2013-08-19 NOTE — Telephone Encounter (Signed)
The patient has my name to contact me when she is ready.

## 2013-08-21 ENCOUNTER — Other Ambulatory Visit: Payer: Self-pay | Admitting: Dermatology

## 2013-08-21 DIAGNOSIS — L608 Other nail disorders: Secondary | ICD-10-CM | POA: Diagnosis not present

## 2013-08-21 DIAGNOSIS — L282 Other prurigo: Secondary | ICD-10-CM | POA: Diagnosis not present

## 2013-08-21 DIAGNOSIS — C44319 Basal cell carcinoma of skin of other parts of face: Secondary | ICD-10-CM | POA: Diagnosis not present

## 2013-08-21 DIAGNOSIS — M25579 Pain in unspecified ankle and joints of unspecified foot: Secondary | ICD-10-CM | POA: Diagnosis not present

## 2013-08-21 DIAGNOSIS — D692 Other nonthrombocytopenic purpura: Secondary | ICD-10-CM | POA: Diagnosis not present

## 2013-08-21 DIAGNOSIS — D485 Neoplasm of uncertain behavior of skin: Secondary | ICD-10-CM | POA: Diagnosis not present

## 2013-08-22 ENCOUNTER — Telehealth: Payer: Self-pay | Admitting: Cardiology

## 2013-08-22 DIAGNOSIS — M19019 Primary osteoarthritis, unspecified shoulder: Secondary | ICD-10-CM | POA: Diagnosis not present

## 2013-08-22 DIAGNOSIS — M1710 Unilateral primary osteoarthritis, unspecified knee: Secondary | ICD-10-CM | POA: Diagnosis not present

## 2013-08-22 NOTE — Telephone Encounter (Signed)
New Message ° °Pt returned call for results. °

## 2013-08-22 NOTE — Telephone Encounter (Signed)
Patient called to say that her daughter, [name], can bring her to appointment with Dr. Fletcher Anon on either May 6 or May 13.  Those are the only 2 days in May that [name] will be available.

## 2013-08-22 NOTE — Telephone Encounter (Signed)
Review doppler and duplex reports, and Dr. Claris Gladden result notes and called patient back. Asked her to consider a couple other days in May that are open in Dr. Tyrell Antonio schedule, so that she can be seen sooner, since she has an ulcer that may not heal due to decreased circulation to that area.    Patient would prefer that Webb Silversmith, Dr. Claris Gladden primary nurse, call her to discuss.

## 2013-08-26 NOTE — Telephone Encounter (Signed)
I spoke with patient again and encouraged her to see Dr Fletcher Anon at least one time for evaluation. She states she has a lot going on and it would be inconvenient for her daughter to bring her because she sells real estate. She agreed to talk with her daughter and try to work something out for an appt with Dr Fletcher Anon.

## 2013-08-28 ENCOUNTER — Telehealth: Payer: Self-pay | Admitting: Cardiology

## 2013-08-28 NOTE — Telephone Encounter (Signed)
noted 

## 2013-08-28 NOTE — Telephone Encounter (Signed)
BUSY 

## 2013-08-28 NOTE — Telephone Encounter (Signed)
Patient would like for you to give her a call, she would like to discuss a few things with you. Please call and advise.

## 2013-08-28 NOTE — Telephone Encounter (Signed)
New message     FYI Pt called [place] and she is not eligible for assistance because she has Geologist, engineering.  Thanks for your help

## 2013-08-28 NOTE — Telephone Encounter (Signed)
Answered questions about medications.

## 2013-09-02 ENCOUNTER — Telehealth: Payer: Self-pay | Admitting: *Deleted

## 2013-09-02 MED ORDER — IPRATROPIUM BROMIDE 0.06 % NA SOLN: 2.0000 | Freq: Three times a day (TID) | NASAL | Status: DC

## 2013-09-02 MED ORDER — HYDROCODONE-ACETAMINOPHEN 7.5-325 MG PO TABS: 0.5000 | ORAL_TABLET | ORAL | Status: DC | PRN

## 2013-09-02 MED ORDER — METOPROLOL SUCCINATE ER 50 MG PO TB24: ORAL_TABLET | ORAL | Status: DC

## 2013-09-02 MED ORDER — PANTOPRAZOLE SODIUM 40 MG PO TBEC: DELAYED_RELEASE_TABLET | ORAL | Status: DC

## 2013-09-02 NOTE — Addendum Note (Signed)
Addended by: Roma Schanz R on: 09/02/2013 02:49 PM   Modules accepted: Orders

## 2013-09-02 NOTE — Telephone Encounter (Signed)
#  30 until establishes with new MD ( ? Dr Alain Marion)

## 2013-09-02 NOTE — Telephone Encounter (Signed)
Pt called states she only wants to speak with Jacqueline [name] about her medication refills.

## 2013-09-02 NOTE — Addendum Note (Signed)
Addended by: Roma Schanz R on: 09/02/2013 02:09 PM   Modules accepted: Orders

## 2013-09-02 NOTE — Telephone Encounter (Addendum)
1)Dr Linna Darner this is a patient of Dr Linda Hedges requesting a refill on Hydrocodone to be faxed to [facility] for 90 day supply Please advise  2)Lou- patient is requesting Dr Alain Marion as her physician

## 2013-09-02 NOTE — Telephone Encounter (Addendum)
I called patient and she is aware she is receiving qty 30 on Hydrocodone until she establishes with a new pcp. She states she will have someone come pick up the script for her.

## 2013-09-09 ENCOUNTER — Telehealth (HOSPITAL_COMMUNITY): Payer: Self-pay | Admitting: Cardiology

## 2013-09-09 NOTE — Telephone Encounter (Signed)
Spoke with patient regarding trying to get her scheduled with Dr. Fletcher Anon, for PV eval secondary to non-healing wound and PAD.  She will have her daughter, [name], call me tomorrow to try to get something scheduled.

## 2013-09-25 ENCOUNTER — Telehealth: Payer: Self-pay | Admitting: Cardiology

## 2013-09-25 DIAGNOSIS — I4891 Unspecified atrial fibrillation: Secondary | ICD-10-CM

## 2013-09-25 MED ORDER — RIVAROXABAN 15 MG PO TABS: 15.0000 mg | ORAL_TABLET | Freq: Every day | ORAL | Status: DC

## 2013-09-25 MED ORDER — FUROSEMIDE 40 MG PO TABS: 40.0000 mg | ORAL_TABLET | Freq: Every day | ORAL | Status: DC

## 2013-09-25 NOTE — Telephone Encounter (Signed)
Pt states she stopped Zetia because it caused her to have stomach and lower back pain.

## 2013-09-25 NOTE — Telephone Encounter (Signed)
Pt states she has not had any more rectal bleeding, I again recommended pt contact her PCP for further recommendations.

## 2013-09-25 NOTE — Telephone Encounter (Signed)
New message      Pt stopped taking zetia because it was making her stomach hurt. She had rectal bleeding over the holiday weekend.  She is not taking any more zetia.  Please call.

## 2013-09-25 NOTE — Telephone Encounter (Signed)
Pt advised to contact PCP for rectal bleeding over the holiday weekend last weekend.

## 2013-09-25 NOTE — Telephone Encounter (Signed)
If she has no more rectal bleeding, can continue Xarelto but will need CBC and should get GI followup for rectal bleeding last weekend.

## 2013-09-26 DIAGNOSIS — M1710 Unilateral primary osteoarthritis, unspecified knee: Secondary | ICD-10-CM | POA: Diagnosis not present

## 2013-09-26 DIAGNOSIS — M19019 Primary osteoarthritis, unspecified shoulder: Secondary | ICD-10-CM | POA: Diagnosis not present

## 2013-09-26 NOTE — Addendum Note (Signed)
Addended by: Katrine Coho on: 09/26/2013 11:13 AM   Modules accepted: Orders

## 2013-09-26 NOTE — Telephone Encounter (Signed)
Pt states that she thinks her rectal bleeding last week was a result of her intolerance to Zetia. She had stomach and lower back pain that she feels was caused by Zetia She stopped Zetia and all of her symptoms have resolved, including rectal bleeding. She has not had any rectal bleeding in 6 days. She does not feel she needs to follow up with PCP/GI, she feels rectal bleeding was the result of her intolerance to Zetia. She did agree to come for CBC next week. I confirmed that she is currently taking "[place]" Xarelto and she has not missed any doses of that.

## 2013-10-01 ENCOUNTER — Ambulatory Visit (INDEPENDENT_AMBULATORY_CARE_PROVIDER_SITE_OTHER): Payer: Medicare Other | Admitting: Cardiovascular Disease

## 2013-10-01 ENCOUNTER — Other Ambulatory Visit: Payer: Medicare Other

## 2013-10-01 ENCOUNTER — Encounter: Payer: Self-pay | Admitting: Cardiovascular Disease

## 2013-10-01 VITALS — BP 122/80 | HR 76 | Ht 62.0 in | Wt 150.0 lb

## 2013-10-01 DIAGNOSIS — L97509 Non-pressure chronic ulcer of other part of unspecified foot with unspecified severity: Secondary | ICD-10-CM

## 2013-10-01 DIAGNOSIS — I25810 Atherosclerosis of coronary artery bypass graft(s) without angina pectoris: Secondary | ICD-10-CM | POA: Diagnosis not present

## 2013-10-01 DIAGNOSIS — I4891 Unspecified atrial fibrillation: Secondary | ICD-10-CM | POA: Diagnosis not present

## 2013-10-01 DIAGNOSIS — R21 Rash and other nonspecific skin eruption: Secondary | ICD-10-CM

## 2013-10-01 DIAGNOSIS — I739 Peripheral vascular disease, unspecified: Secondary | ICD-10-CM

## 2013-10-01 DIAGNOSIS — L97519 Non-pressure chronic ulcer of other part of right foot with unspecified severity: Secondary | ICD-10-CM

## 2013-10-01 LAB — CBC WITH DIFFERENTIAL/PLATELET
BASOS PCT: 0 % (ref 0.0–3.0)
Basophils Absolute: 0 10*3/uL (ref 0.0–0.1)
Eosinophils Absolute: 0 10*3/uL (ref 0.0–0.7)
Eosinophils Relative: 0 % (ref 0.0–5.0)
HCT: 39.8 % (ref 36.0–46.0)
Hemoglobin: 13.2 g/dL (ref 12.0–15.0)
LYMPHS PCT: 10.9 % — AB (ref 12.0–46.0)
Lymphs Abs: 1 10*3/uL (ref 0.7–4.0)
MCHC: 33.1 g/dL (ref 30.0–36.0)
MCV: 96.2 fl (ref 78.0–100.0)
MONOS PCT: 7.2 % (ref 3.0–12.0)
Monocytes Absolute: 0.6 10*3/uL (ref 0.1–1.0)
NEUTROS PCT: 81.9 % — AB (ref 43.0–77.0)
Neutro Abs: 7.3 10*3/uL (ref 1.4–7.7)
Platelets: 327 10*3/uL (ref 150.0–400.0)
RBC: 4.14 Mil/uL (ref 3.87–5.11)
RDW: 16.1 % — ABNORMAL HIGH (ref 11.5–15.5)
WBC: 8.9 10*3/uL (ref 4.0–10.5)

## 2013-10-01 LAB — LIPID PANEL
Cholesterol: 264 mg/dL — ABNORMAL HIGH (ref 0–200)
HDL: 71.4 mg/dL (ref >39.00)
LDL CALC: 134 mg/dL — AB (ref 0–99)
TRIGLYCERIDES: 295 mg/dL — AB (ref 0.0–149.0)
Total CHOL/HDL Ratio: 4
VLDL: 59 mg/dL — AB (ref 0.0–40.0)

## 2013-10-01 LAB — HEPATIC FUNCTION PANEL
ALBUMIN: 3.6 g/dL (ref 3.5–5.2)
ALT: 22 U/L (ref 0–35)
AST: 19 U/L (ref 0–37)
Alkaline Phosphatase: 61 U/L (ref 39–117)
Bilirubin, Direct: 0 mg/dL (ref 0.0–0.3)
Total Bilirubin: 0.6 mg/dL (ref 0.2–1.2)
Total Protein: 6.3 g/dL (ref 6.0–8.3)

## 2013-10-01 MED ORDER — CLOBETASOL PROPIONATE 0.05 % EX CREA: TOPICAL_CREAM | CUTANEOUS | Status: DC

## 2013-10-01 NOTE — Patient Instructions (Signed)
Your physician wants you to follow-up in: 6 months with Dr. Fletcher Anon.  You will receive a reminder letter in the mail two months in advance. If you don't receive a letter, please call our office to schedule the follow-up appointment @ [phone]    Your physician recommends that you continue on your current medications as directed. Please refer to the Current Medication list given to you today.  I refilled your cream today to [facility]

## 2013-10-03 ENCOUNTER — Telehealth: Payer: Self-pay | Admitting: *Deleted

## 2013-10-03 DIAGNOSIS — I739 Peripheral vascular disease, unspecified: Secondary | ICD-10-CM | POA: Insufficient documentation

## 2013-10-03 NOTE — Assessment & Plan Note (Signed)
The patient has evidence of peripheral arterial disease on the right side with proximal SFA occlusion and occluded posterior tibial artery. The also on the lateral side of the right small toe has almost completely healed and currently she complains of no claudication. Thus, no indication for revascularization.  I recommend continued treatment of risk factors and considering angiography if she becomes symptomatic.

## 2013-10-03 NOTE — Progress Notes (Signed)
Quick Note:  Patient notified of lab results. Patient verbalized understanding that lipids are high. She indicated that she is aware that she needs to use diet and exercise to help control it. She states this is a "genetic problem in her family". Patient also stating that she is requesting to see PCP, Dr. Alain Marion, since Dr. [name] is retired now. She also wanted to ensure that we had the correct pharmacy on file for her, which is [facility]. We do have that on her file with contact information. ______

## 2013-10-03 NOTE — Telephone Encounter (Signed)
Patient notified of lab results. Patient verbalized understanding that lipids are high. She indicated that she is aware that she needs to use diet and exercise to help control it. She states this is a "genetic problem in her family".  Patient also stating that she is requesting to see PCP, Dr. Alain Marion, since Dr. [name] is retired now.  She also wanted to ensure that we had the correct pharmacy on file for her, which is [facility]. We do have that on her file with contact information.

## 2013-10-03 NOTE — Progress Notes (Signed)
HPI  This is an 79 yo female who was performed by [name] for evaluation of peripheral arterial disease. She has known history of CVA, chronic diastolic CHF, and atrial fibrillation with RVR .  She had a small ulceration on the lateral right small toe. This improved significantly with a steroid cream. It's almost completely healed now. She underwent noninvasive vascular evaluation in April which showed mildly reduced ABI on the right side at 0.81 with evidence of short proximal SFA occlusion with two-vessel runoff below the knee. She reports no calf claudication.   Allergies  Allergen Reactions  . Metoclopramide Hcl Other (See Comments)     Tremors  . Penicillins Itching  . Aspirin Other (See Comments)    "gave me stomach aches; made me bleed" (03/13/12)  . Zetia [Ezetimibe] Other (See Comments)    Stomach and back pain     Current Outpatient Prescriptions on File Prior to Visit  Medication Sig Dispense Refill  . ALPRAZolam (XANAX) 0.25 MG tablet Take 0.5 tablets (0.125 mg total) by mouth every 3 (three) hours.  360 tablet  3  . cloNIDine (CATAPRES) 0.1 MG tablet Take 1 tablet (0.1 mg total) by mouth every 2 (two) hours as needed (take as needed for a  Blood pressure that is greater than 160 on top, or bottom number greater than 100.).  30 tablet  3  . furosemide (LASIX) 40 MG tablet Take 1 tablet (40 mg total) by mouth daily.  90 tablet  3  . ipratropium (ATROVENT) 0.06 % nasal spray Place 2 sprays into the nose 3 (three) times daily.  45 mL  0  . metoprolol succinate (TOPROL-XL) 50 MG 24 hr tablet TAKE 1 TABLET (50 MG TOTAL) BY MOUTH 2 (TWO) TIMES DAILY. TAKE WITH OR IMMEDIATELY FOLLOWING A MEAL.  180 tablet  0  . Multiple Vitamins-Minerals (CENTRUM SILVER PO) Take 1 tablet by mouth daily.      . pantoprazole (PROTONIX) 40 MG tablet TAKE 1 TABLET EVERY MORNING  90 tablet  0  . potassium chloride SA (K-DUR,KLOR-CON) 20 MEQ tablet TAKE 1 TABLET EVERY DAY  90 tablet  3  .  Rivaroxaban (XARELTO) 15 MG TABS tablet Take 1 tablet (15 mg total) by mouth daily.  90 tablet  1  . traMADol (ULTRAM) 50 MG tablet Take 1 tablet (50 mg total) by mouth every 6 (six) hours as needed.  360 tablet  0   No current facility-administered medications on file prior to visit.     Past Medical History  Diagnosis Date  . Allergy, unspecified not elsewhere classified   . Other dyspnea and respiratory abnormality   . Other diseases of lung, not elsewhere classified   . Anemia, unspecified   . Unspecified adverse effect of other drug, medicinal and biological substance(995.29)   . Blood in stool   . Hemorrhage of gastrointestinal tract, unspecified   . Personal history of peptic ulcer disease   . Anxiety state, unspecified   . Personal history of other diseases of digestive system   . Unspecified essential hypertension   . Complication of anesthesia     "hard to wake me up after colonoscopy" (03/13/12)  . H/O hiatal hernia   . GERD (gastroesophageal reflux disease)   . Unspecified paranoid state   . Atrial fibrillation with rapid ventricular response 03/13/12  . Osteoarthrosis, unspecified whether generalized or localized, lower leg   . Degeneration of lumbar or lumbosacral intervertebral disc   . Unspecified cerebral artery occlusion  with cerebral infarction 10/2008    "mini stroke" denies residual (03/13/12)  . History of IBS      Past Surgical History  Procedure Laterality Date  . Pilonidal cyst / sinus excision  1957  . Dilation and curettage of uterus  1950's?  . Shoulder arthroscopy  ~ 2000    right     Family History  Problem Relation Age of Onset  . Diabetes Mother   . CVA Mother   . CVA Father      History   Social History  . Marital Status: Widowed    Spouse Name: N/A    Number of Children: 4  . Years of Education: 12   Occupational History  . retired    Social History Main Topics  . Smoking status: Former Smoker -- 1.00 packs/day for 40  years    Types: Cigarettes    Quit date: 05/02/2002  . Smokeless tobacco: Never Used  . Alcohol Use: No  . Drug Use: No  . Sexual Activity: No   Other Topics Concern  . Not on file   Social History Narrative   HSG. Married: 39- 8 yrs/ divorced; married 1960- 1988/ widowed. 4 grandchildren. 2 daughters- 1950, 1962; 2 sons- 1964, 1965. Lives alone- independent ADLs- pet cockatiel.     ROS A 10 point review of system was performed. It is negative other than that mentioned in the history of present illness.   PHYSICAL EXAM   BP 122/80  Pulse 76  Ht 5\' 2"  (1.575 m)  Wt 150 lb (68.04 kg)  BMI 27.43 kg/m2 Constitutional: She is oriented to person, place, and time. She appears well-developed and well-nourished. No distress.  HENT: No nasal discharge.  Head: Normocephalic and atraumatic.  Eyes: Pupils are equal and round. No discharge.  Neck: Normal range of motion. Neck supple. No JVD present. No thyromegaly present.  Cardiovascular: Normal rate, regular rhythm, normal heart sounds. Exam reveals no gallop and no friction rub. No murmur heard.  Pulmonary/Chest: Effort normal and breath sounds normal. No stridor. No respiratory distress. She has no wheezes. She has no rales. She exhibits no tenderness.  Abdominal: Soft. Bowel sounds are normal. She exhibits no distension. There is no tenderness. There is no rebound and no guarding.  Musculoskeletal: Normal range of motion. She exhibits no edema and no tenderness.  Neurological: She is alert and oriented to person, place, and time. Coordination normal.  Skin: Skin is warm and dry. No rash noted. She is not diaphoretic. No erythema. No pallor.  Psychiatric: She has a normal mood and affect. Her behavior is normal. Judgment and thought content normal.  Vascular: Femoral pulses are normal. Distal pulses are not palpable on the right side and faint on the left side     ASSESSMENT AND PLAN

## 2013-10-09 ENCOUNTER — Other Ambulatory Visit: Payer: Medicare Other

## 2013-10-24 ENCOUNTER — Telehealth: Payer: Self-pay | Admitting: Cardiology

## 2013-10-24 MED ORDER — ALPRAZOLAM 0.25 MG PO TABS: 0.1250 mg | ORAL_TABLET | ORAL | Status: DC

## 2013-10-24 MED ORDER — HYDROCODONE-ACETAMINOPHEN 7.5-750 MG PO TABS: 1.0000 | ORAL_TABLET | Freq: Four times a day (QID) | ORAL | Status: DC | PRN

## 2013-10-24 NOTE — Telephone Encounter (Signed)
PT  AWARE XANAX CALLED IN UNABLE TO   PHONE  IN HYDROCODONE   WILL  FILL OUT  SCRIPT    TOM  AND  HAVE  PA  SIGN  OR  WILL HAVE  TO WAIT UNTIL  DR MCLEAN HERE IN OFFICE   TO SIGN   PT  TO CALL TOM  TO  SEE IF  READY  FOR SON TO PICK UP .Adonis Housekeeper

## 2013-10-24 NOTE — Telephone Encounter (Signed)
New message    Patient calling need a referral to PCP since Dr. Linda Hedges has retired. Would like stay in the [facility]. Or can [name] be her PCP.

## 2013-10-24 NOTE — Telephone Encounter (Signed)
Dr. Regis Bill, Dr. Ronnald Ramp, Dr. Alain Marion are good.  We can refill her meds x 1.

## 2013-10-24 NOTE — Telephone Encounter (Signed)
PT  WOULD LIKE  YOU TO RECOMMEND  NEW PCP   AND  ALSO  WOULD YOU  BE WILLING  TO  FILL  HYDROCODONE  AND  XANAX   UNTIL  SHE  CAN  GET  ESTABLISHED  WITH  NEW  MD./CY

## 2013-10-24 NOTE — Telephone Encounter (Signed)
F/u   Pt has question about her refill medications. Please call pt.

## 2013-10-25 ENCOUNTER — Telehealth: Payer: Self-pay | Admitting: Physician Assistant

## 2013-10-25 ENCOUNTER — Telehealth: Payer: Self-pay | Admitting: *Deleted

## 2013-10-25 MED ORDER — HYDROCODONE-ACETAMINOPHEN 7.5-750 MG PO TABS: 1.0000 | ORAL_TABLET | Freq: Four times a day (QID) | ORAL | Status: DC | PRN

## 2013-10-25 NOTE — Telephone Encounter (Signed)
MESSAGE LEFT  THAT HAVE LEFT  SIGNED  SCRIPT  AT  FRONT  DESK FOR  PICK UP .Adonis Housekeeper

## 2013-10-25 NOTE — Telephone Encounter (Signed)
New Prob    A prescription was written for Hydrocodone-Acetaminophen 7.5/750 mg for pt. However, pharmacy does not carry this. Requesting an alternative. Suggestioning Hydrocodone-Acetaminophen 7.5/325 mg.

## 2013-10-25 NOTE — Telephone Encounter (Signed)
SPOKE  WITH  PT 'S  DAUGHTER  [place]  7.5/ 750 MG    THAT MED  IS NOT ON MARKET   ANY   MORE NEEDED  FOR   [place]  7.5 /325 MG   WILL FORWARD  TO  ANNE  TO  ADDRESS [place]  HAD  SIGNED FOR  PREVIOUS   REFILL  AS  DR [facility] NOT IN OFFICE  UNTIL TUES PLEASE  CALL  DAUGHTER  WITH  [place]  [phone]./CY

## 2013-10-28 NOTE — Telephone Encounter (Signed)
Sent message to Desiree Lucy, RN for [name]

## 2013-10-28 NOTE — Telephone Encounter (Signed)
To Dr Aundra Dubin to OK this prescription.

## 2013-10-28 NOTE — Telephone Encounter (Signed)
OK if we've given it to her before.  In future get from PCP.

## 2013-10-29 MED ORDER — HYDROCODONE-ACETAMINOPHEN 7.5-325 MG PO TABS: ORAL_TABLET | ORAL | Status: DC

## 2013-10-29 NOTE — Telephone Encounter (Signed)
Dr Aundra Dubin has provided pt with a prescription for Norco 7.5-325mg  #30 tablets bid prn pain. Pt's daughter, [name] aware I am leaving a written prescription for this at the front desk for pick up. [name] is aware any additional refills pt may need will need to be by her PCP.

## 2013-12-03 ENCOUNTER — Other Ambulatory Visit: Payer: Self-pay

## 2013-12-03 NOTE — Telephone Encounter (Signed)
#  30 of both  Would need OV before refills , last seen 12/14

## 2013-12-03 NOTE — Telephone Encounter (Signed)
Former patient of Dr Linda Hedges Appt with Dr Doug Sou on 02/20/2014 Last labs 10/01/13

## 2013-12-04 MED ORDER — TRAMADOL HCL 50 MG PO TABS: 50.0000 mg | ORAL_TABLET | Freq: Four times a day (QID) | ORAL | Status: DC | PRN

## 2013-12-04 MED ORDER — PANTOPRAZOLE SODIUM 40 MG PO TBEC: DELAYED_RELEASE_TABLET | ORAL | Status: AC

## 2013-12-05 ENCOUNTER — Other Ambulatory Visit: Payer: Self-pay | Admitting: *Deleted

## 2013-12-05 MED ORDER — METOPROLOL SUCCINATE ER 50 MG PO TB24: ORAL_TABLET | ORAL | Status: DC

## 2013-12-18 ENCOUNTER — Telehealth: Payer: Self-pay | Admitting: *Deleted

## 2013-12-18 NOTE — Telephone Encounter (Signed)
Left msg on triage stating pt is wanting to get her hydrocodone & alprazolam through [facility] mail service. She is currently out of meds pt is hoping to get a 30 day [facility], then a 90 script fax to [facility] @ [phone]. Per chart pt has a new appt schedule to see Dr. Doug Sou in Oct. Pls advise...Johny Chess

## 2013-12-19 MED ORDER — ALPRAZOLAM 0.25 MG PO TABS: 0.1250 mg | ORAL_TABLET | ORAL | Status: DC

## 2013-12-19 MED ORDER — HYDROCODONE-ACETAMINOPHEN 7.5-325 MG PO TABS: ORAL_TABLET | ORAL | Status: DC

## 2013-12-19 NOTE — Telephone Encounter (Signed)
Ok both Thx 

## 2013-12-19 NOTE — Telephone Encounter (Signed)
Notified pt md approved scripts the 30 day is ready for pickup, and we have fax humana on the mail order,.../lmb

## 2014-01-02 ENCOUNTER — Other Ambulatory Visit: Payer: Self-pay

## 2014-01-02 MED ORDER — IPRATROPIUM BROMIDE 0.06 % NA SOLN: 2.0000 | Freq: Three times a day (TID) | NASAL | Status: DC

## 2014-01-03 NOTE — Telephone Encounter (Signed)
error 

## 2014-01-07 ENCOUNTER — Other Ambulatory Visit: Payer: Self-pay | Admitting: Internal Medicine

## 2014-01-07 MED ORDER — TRAMADOL HCL 50 MG PO TABS: 50.0000 mg | ORAL_TABLET | Freq: Four times a day (QID) | ORAL | Status: DC | PRN

## 2014-01-10 ENCOUNTER — Telehealth: Payer: Self-pay | Admitting: Internal Medicine

## 2014-01-10 NOTE — Telephone Encounter (Signed)
Son said to call him [phone]  Jacqueline [name], ok to talk to.

## 2014-01-10 NOTE — Telephone Encounter (Signed)
Patient will be establishing with Dr. Doug Sou in October.  Dr. Alain Marion refilled hydrocodone but when he did patient states he cut the dosage in half from what Dr. Linda Hedges prescribed.  She is out of hydrocodone and is requesting a refill.

## 2014-01-13 NOTE — Telephone Encounter (Signed)
I spoke to patient. Dr. Doug Sou will not refill Hydrocodone without seeing the patient. She will call the pharmacy to see if there are any refills and if not, she will call back and schedule an appointment to be seen.

## 2014-01-15 DIAGNOSIS — E7800 Pure hypercholesterolemia, unspecified: Secondary | ICD-10-CM | POA: Diagnosis not present

## 2014-01-15 DIAGNOSIS — I4891 Unspecified atrial fibrillation: Secondary | ICD-10-CM | POA: Diagnosis not present

## 2014-01-15 DIAGNOSIS — I679 Cerebrovascular disease, unspecified: Secondary | ICD-10-CM | POA: Diagnosis not present

## 2014-01-15 DIAGNOSIS — Z79899 Other long term (current) drug therapy: Secondary | ICD-10-CM | POA: Diagnosis not present

## 2014-01-15 DIAGNOSIS — I10 Essential (primary) hypertension: Secondary | ICD-10-CM | POA: Diagnosis not present

## 2014-01-22 DIAGNOSIS — N390 Urinary tract infection, site not specified: Secondary | ICD-10-CM | POA: Diagnosis not present

## 2014-01-31 ENCOUNTER — Other Ambulatory Visit: Payer: Self-pay | Admitting: *Deleted

## 2014-01-31 MED ORDER — FUROSEMIDE 40 MG PO TABS: 40.0000 mg | ORAL_TABLET | Freq: Every day | ORAL | Status: DC

## 2014-02-12 DIAGNOSIS — Z23 Encounter for immunization: Secondary | ICD-10-CM | POA: Diagnosis not present

## 2014-02-12 DIAGNOSIS — I129 Hypertensive chronic kidney disease with stage 1 through stage 4 chronic kidney disease, or unspecified chronic kidney disease: Secondary | ICD-10-CM | POA: Diagnosis not present

## 2014-02-12 DIAGNOSIS — R5383 Other fatigue: Secondary | ICD-10-CM | POA: Diagnosis not present

## 2014-02-12 DIAGNOSIS — I482 Chronic atrial fibrillation: Secondary | ICD-10-CM | POA: Diagnosis not present

## 2014-02-12 DIAGNOSIS — R600 Localized edema: Secondary | ICD-10-CM | POA: Diagnosis not present

## 2014-02-14 ENCOUNTER — Other Ambulatory Visit: Payer: Self-pay

## 2014-02-20 ENCOUNTER — Ambulatory Visit: Payer: Medicare Other | Admitting: Internal Medicine

## 2014-03-17 ENCOUNTER — Other Ambulatory Visit: Payer: Self-pay | Admitting: Cardiology

## 2014-03-17 ENCOUNTER — Other Ambulatory Visit: Payer: Self-pay | Admitting: Internal Medicine

## 2014-03-18 DIAGNOSIS — C44319 Basal cell carcinoma of skin of other parts of face: Secondary | ICD-10-CM | POA: Diagnosis not present

## 2014-03-25 ENCOUNTER — Other Ambulatory Visit: Payer: Self-pay | Admitting: *Deleted

## 2014-03-25 MED ORDER — METOPROLOL SUCCINATE ER 50 MG PO TB24: ORAL_TABLET | ORAL | Status: DC

## 2014-03-31 ENCOUNTER — Emergency Department (HOSPITAL_COMMUNITY): Payer: Medicare Other

## 2014-03-31 ENCOUNTER — Inpatient Hospital Stay (HOSPITAL_COMMUNITY)
Admission: EM | Admit: 2014-03-31 | Discharge: 2014-04-07 | DRG: 493 | Disposition: A | Discharge status: Skilled Nursing Facility | Payer: Medicare Other | Attending: Family Medicine | Admitting: Family Medicine

## 2014-03-31 DIAGNOSIS — S42401D Unspecified fracture of lower end of right humerus, subsequent encounter for fracture with routine healing: Secondary | ICD-10-CM | POA: Diagnosis not present

## 2014-03-31 DIAGNOSIS — S81012A Laceration without foreign body, left knee, initial encounter: Secondary | ICD-10-CM | POA: Diagnosis present

## 2014-03-31 DIAGNOSIS — Z8711 Personal history of peptic ulcer disease: Secondary | ICD-10-CM | POA: Diagnosis not present

## 2014-03-31 DIAGNOSIS — Z880 Allergy status to penicillin: Secondary | ICD-10-CM

## 2014-03-31 DIAGNOSIS — G8918 Other acute postprocedural pain: Secondary | ICD-10-CM | POA: Diagnosis not present

## 2014-03-31 DIAGNOSIS — R42 Dizziness and giddiness: Secondary | ICD-10-CM | POA: Diagnosis not present

## 2014-03-31 DIAGNOSIS — S42301A Unspecified fracture of shaft of humerus, right arm, initial encounter for closed fracture: Secondary | ICD-10-CM | POA: Diagnosis not present

## 2014-03-31 DIAGNOSIS — K219 Gastro-esophageal reflux disease without esophagitis: Secondary | ICD-10-CM | POA: Diagnosis present

## 2014-03-31 DIAGNOSIS — S199XXA Unspecified injury of neck, initial encounter: Secondary | ICD-10-CM | POA: Diagnosis not present

## 2014-03-31 DIAGNOSIS — S4991XA Unspecified injury of right shoulder and upper arm, initial encounter: Secondary | ICD-10-CM | POA: Diagnosis not present

## 2014-03-31 DIAGNOSIS — I5032 Chronic diastolic (congestive) heart failure: Secondary | ICD-10-CM | POA: Diagnosis present

## 2014-03-31 DIAGNOSIS — Y92013 Bedroom of single-family (private) house as the place of occurrence of the external cause: Secondary | ICD-10-CM | POA: Diagnosis not present

## 2014-03-31 DIAGNOSIS — R2681 Unsteadiness on feet: Secondary | ICD-10-CM | POA: Diagnosis not present

## 2014-03-31 DIAGNOSIS — Z87891 Personal history of nicotine dependence: Secondary | ICD-10-CM | POA: Diagnosis not present

## 2014-03-31 DIAGNOSIS — K449 Diaphragmatic hernia without obstruction or gangrene: Secondary | ICD-10-CM | POA: Diagnosis present

## 2014-03-31 DIAGNOSIS — L089 Local infection of the skin and subcutaneous tissue, unspecified: Secondary | ICD-10-CM | POA: Diagnosis present

## 2014-03-31 DIAGNOSIS — I639 Cerebral infarction, unspecified: Secondary | ICD-10-CM | POA: Diagnosis present

## 2014-03-31 DIAGNOSIS — T1490 Injury, unspecified: Secondary | ICD-10-CM | POA: Diagnosis not present

## 2014-03-31 DIAGNOSIS — I10 Essential (primary) hypertension: Secondary | ICD-10-CM | POA: Diagnosis present

## 2014-03-31 DIAGNOSIS — F411 Generalized anxiety disorder: Secondary | ICD-10-CM | POA: Diagnosis present

## 2014-03-31 DIAGNOSIS — Z7901 Long term (current) use of anticoagulants: Secondary | ICD-10-CM | POA: Diagnosis not present

## 2014-03-31 DIAGNOSIS — M6281 Muscle weakness (generalized): Secondary | ICD-10-CM | POA: Diagnosis not present

## 2014-03-31 DIAGNOSIS — D649 Anemia, unspecified: Secondary | ICD-10-CM | POA: Diagnosis present

## 2014-03-31 DIAGNOSIS — I2510 Atherosclerotic heart disease of native coronary artery without angina pectoris: Secondary | ICD-10-CM | POA: Diagnosis present

## 2014-03-31 DIAGNOSIS — W19XXXA Unspecified fall, initial encounter: Secondary | ICD-10-CM | POA: Diagnosis not present

## 2014-03-31 DIAGNOSIS — S42409A Unspecified fracture of lower end of unspecified humerus, initial encounter for closed fracture: Secondary | ICD-10-CM | POA: Diagnosis not present

## 2014-03-31 DIAGNOSIS — I739 Peripheral vascular disease, unspecified: Secondary | ICD-10-CM | POA: Diagnosis present

## 2014-03-31 DIAGNOSIS — M79603 Pain in arm, unspecified: Secondary | ICD-10-CM | POA: Diagnosis not present

## 2014-03-31 DIAGNOSIS — S42341A Displaced spiral fracture of shaft of humerus, right arm, initial encounter for closed fracture: Secondary | ICD-10-CM

## 2014-03-31 DIAGNOSIS — S42401A Unspecified fracture of lower end of right humerus, initial encounter for closed fracture: Secondary | ICD-10-CM

## 2014-03-31 DIAGNOSIS — Z8673 Personal history of transient ischemic attack (TIA), and cerebral infarction without residual deficits: Secondary | ICD-10-CM | POA: Diagnosis not present

## 2014-03-31 DIAGNOSIS — IMO0002 Reserved for concepts with insufficient information to code with codable children: Secondary | ICD-10-CM

## 2014-03-31 DIAGNOSIS — M25511 Pain in right shoulder: Secondary | ICD-10-CM | POA: Diagnosis not present

## 2014-03-31 DIAGNOSIS — S42411A Displaced simple supracondylar fracture without intercondylar fracture of right humerus, initial encounter for closed fracture: Secondary | ICD-10-CM | POA: Diagnosis not present

## 2014-03-31 DIAGNOSIS — S42331A Displaced oblique fracture of shaft of humerus, right arm, initial encounter for closed fracture: Secondary | ICD-10-CM | POA: Diagnosis not present

## 2014-03-31 DIAGNOSIS — R51 Headache: Secondary | ICD-10-CM | POA: Diagnosis not present

## 2014-03-31 DIAGNOSIS — W1830XA Fall on same level, unspecified, initial encounter: Secondary | ICD-10-CM | POA: Diagnosis present

## 2014-03-31 DIAGNOSIS — M79601 Pain in right arm: Secondary | ICD-10-CM | POA: Diagnosis not present

## 2014-03-31 DIAGNOSIS — M1990 Unspecified osteoarthritis, unspecified site: Secondary | ICD-10-CM | POA: Diagnosis present

## 2014-03-31 DIAGNOSIS — R937 Abnormal findings on diagnostic imaging of other parts of musculoskeletal system: Secondary | ICD-10-CM | POA: Diagnosis not present

## 2014-03-31 DIAGNOSIS — S42302A Unspecified fracture of shaft of humerus, left arm, initial encounter for closed fracture: Secondary | ICD-10-CM | POA: Diagnosis present

## 2014-03-31 DIAGNOSIS — I480 Paroxysmal atrial fibrillation: Secondary | ICD-10-CM | POA: Diagnosis present

## 2014-03-31 DIAGNOSIS — T148 Other injury of unspecified body region: Secondary | ICD-10-CM | POA: Diagnosis not present

## 2014-03-31 DIAGNOSIS — T1490XA Injury, unspecified, initial encounter: Secondary | ICD-10-CM

## 2014-03-31 DIAGNOSIS — I110 Hypertensive heart disease with heart failure: Secondary | ICD-10-CM | POA: Diagnosis present

## 2014-03-31 DIAGNOSIS — Z9181 History of falling: Secondary | ICD-10-CM | POA: Diagnosis not present

## 2014-03-31 DIAGNOSIS — W06XXXD Fall from bed, subsequent encounter: Secondary | ICD-10-CM | POA: Diagnosis not present

## 2014-03-31 DIAGNOSIS — S0990XA Unspecified injury of head, initial encounter: Secondary | ICD-10-CM | POA: Diagnosis not present

## 2014-03-31 DIAGNOSIS — Y92009 Unspecified place in unspecified non-institutional (private) residence as the place of occurrence of the external cause: Secondary | ICD-10-CM

## 2014-03-31 LAB — BASIC METABOLIC PANEL
Anion gap: 13 (ref 5–15)
BUN: 16 mg/dL (ref 6–23)
CALCIUM: 9.4 mg/dL (ref 8.4–10.5)
CO2: 24 mEq/L (ref 19–32)
Chloride: 105 mEq/L (ref 96–112)
Creatinine, Ser: 0.69 mg/dL (ref 0.50–1.10)
GFR calc Af Amer: >90 mL/min (ref >90)
GFR calc non Af Amer: 78 mL/min — ABNORMAL LOW (ref >90)
GLUCOSE: 109 mg/dL — AB (ref 70–99)
Potassium: 4.6 mEq/L (ref 3.7–5.3)
SODIUM: 142 meq/L (ref 137–147)

## 2014-03-31 LAB — CBC
HCT: 35.9 % — ABNORMAL LOW (ref 36.0–46.0)
Hemoglobin: 11.8 g/dL — ABNORMAL LOW (ref 12.0–15.0)
MCH: 29.8 pg (ref 26.0–34.0)
MCHC: 32.9 g/dL (ref 30.0–36.0)
MCV: 90.7 fL (ref 78.0–100.0)
PLATELETS: 259 10*3/uL (ref 150–400)
RBC: 3.96 MIL/uL (ref 3.87–5.11)
RDW: 16.1 % — ABNORMAL HIGH (ref 11.5–15.5)
WBC: 6.1 10*3/uL (ref 4.0–10.5)

## 2014-03-31 MED ORDER — MORPHINE SULFATE 4 MG/ML IJ SOLN
4.0000 mg | Freq: Once | INTRAMUSCULAR | Status: AC
  Administered 2014-03-31: 4 mg via INTRAVENOUS
  Filled 2014-03-31: qty 1

## 2014-03-31 MED ORDER — HYDROMORPHONE HCL 1 MG/ML IJ SOLN
1.0000 mg | Freq: Once | INTRAMUSCULAR | Status: AC
  Administered 2014-04-01: 1 mg via INTRAVENOUS
  Filled 2014-03-31: qty 1

## 2014-03-31 MED ORDER — ONDANSETRON HCL 4 MG/2ML IJ SOLN
4.0000 mg | Freq: Once | INTRAMUSCULAR | Status: AC
  Administered 2014-03-31: 4 mg via INTRAVENOUS
  Filled 2014-03-31: qty 2

## 2014-03-31 MED ORDER — LIDOCAINE HCL (PF) 1 % IJ SOLN
30.0000 mL | Freq: Once | INTRAMUSCULAR | Status: AC
  Administered 2014-03-31: 30 mL
  Filled 2014-03-31: qty 30

## 2014-03-31 MED ORDER — TETANUS-DIPHTH-ACELL PERTUSSIS 5-2.5-18.5 LF-MCG/0.5 IM SUSP
0.5000 mL | Freq: Once | INTRAMUSCULAR | Status: AC
  Administered 2014-03-31: 0.5 mL via INTRAMUSCULAR
  Filled 2014-03-31: qty 0.5

## 2014-03-31 NOTE — Progress Notes (Signed)
Orthopedic Tech Progress Note Patient Details:  Jacqueline [name] July 21, 1928 [id number] Applied fiberglass coaptation splint and fiberglass long arm splint to RUE.  Pulses, sensation, motion intact before and after application.  Capillary refill less than 2 seconds before and after application.  Placed RUE in arm sling after splinting same. Ortho Devices Type of Ortho Device: Coapt, Long arm splint Ortho Device/Splint Location: RUE Ortho Device/Splint Interventions: Application   Darrol Poke 03/31/2014, 11:11 PM

## 2014-03-31 NOTE — ED Notes (Signed)
Notified CT patient is ready for transport.

## 2014-03-31 NOTE — ED Notes (Signed)
Ortho at bedside.

## 2014-03-31 NOTE — ED Notes (Signed)
Doctor reposition arm patient tolerated with pain during movement. Paged Ortho to apply sling.  Patient cap refill less then 3 seconds and radial pulse +2

## 2014-03-31 NOTE — ED Notes (Signed)
Per EMS:  EMS called to patient's house via life alert. Patient was sitting on edge of bed before going to bed. States she got up and and lost her balance and fell forward. EMS states that there was an obvious fracture to right humorous. EMS states laceration on left knee to adipose.

## 2014-03-31 NOTE — ED Notes (Signed)
Patient transported by CT unable to perform CT due to patient position of right arm.

## 2014-03-31 NOTE — ED Provider Notes (Signed)
CSN: [id number]     Arrival date & time 03/31/14  1926 History   First MD Initiated Contact with Patient 03/31/14 1928     Chief Complaint  Patient presents with  . Fall     (Consider location/radiation/quality/duration/timing/severity/associated sxs/prior Treatment) The history is provided by the patient, a relative and the EMS personnel. No language interpreter was used.     Jacqueline [name] is a(n) 80 y.o. female who presents with cc of mechanical fall. Patient stood up from seated position and lost her balance.  Patient fell and c/o sever R shoulder pain. She also has pain over the Left knee. She is unsure of her last tetanus update. She lives at home and manages her household affairs, ambulates without aids normally. No home health care.  Past Medical History  Diagnosis Date  . Allergy, unspecified not elsewhere classified   . Other dyspnea and respiratory abnormality   . Other diseases of lung, not elsewhere classified   . Anemia, unspecified   . Unspecified adverse effect of other drug, medicinal and biological substance(995.29)   . Blood in stool   . Hemorrhage of gastrointestinal tract, unspecified   . Personal history of peptic ulcer disease   . Anxiety state, unspecified   . Personal history of other diseases of digestive system   . Unspecified essential hypertension   . Complication of anesthesia     "hard to wake me up after colonoscopy" (03/13/12)  . H/O hiatal hernia   . GERD (gastroesophageal reflux disease)   . Unspecified paranoid state   . Atrial fibrillation with rapid ventricular response 03/13/12  . Osteoarthrosis, unspecified whether generalized or localized, lower leg   . Degeneration of lumbar or lumbosacral intervertebral disc   . Unspecified cerebral artery occlusion with cerebral infarction 10/2008    "mini stroke" denies residual (03/13/12)  . History of IBS    Past Surgical History  Procedure Laterality Date  . Pilonidal cyst / sinus excision  1957    . Dilation and curettage of uterus  1950's?  . Shoulder arthroscopy  ~ 2000    right   Family History  Problem Relation Age of Onset  . Diabetes Mother   . CVA Mother   . CVA Father    History  Substance Use Topics  . Smoking status: Former Smoker -- 1.00 packs/day for 40 years    Types: Cigarettes    Quit date: 05/02/2002  . Smokeless tobacco: Never Used  . Alcohol Use: No   OB History    No data available     Review of Systems  Ten systems reviewed and are negative for acute change, except as noted in the HPI.     Allergies  Metoclopramide hcl; Penicillins; Aspirin; and Zetia  Home Medications   Prior to Admission medications   Medication Sig Start Date End Date Taking? Authorizing Provider  ALPRAZolam (XANAX) 0.25 MG tablet Take 0.5 tablets (0.125 mg total) by mouth every 3 (three) hours. 12/19/13   Aleksei Plotnikov V, MD  clobetasol cream (TEMOVATE) 0.05 % Apply to rash on left foot two times a day 10/01/13   Wellington Hampshire, MD  clobetasol cream (TEMOVATE) 0.05 % Apply to rash on left foot two times a day 10/01/13   Wellington Hampshire, MD  cloNIDine (CATAPRES) 0.1 MG tablet Take 1 tablet (0.1 mg total) by mouth every 2 (two) hours as needed (take as needed for a  Blood pressure that is greater than 160 on top, or bottom number  greater than 100.). 12/04/12   Neena Rhymes, MD  DILTIAZEM HCL ER PO Take 180 mg by mouth 2 (two) times daily. Before meals    Historical Provider, MD  furosemide (LASIX) 40 MG tablet Take 1 tablet (40 mg total) by mouth daily. 01/31/14   Larey Dresser, MD  HYDROcodone-acetaminophen [facility]) 7.5-325 MG per tablet 1 tablet two times a day as needed for pain 12/19/13   Aleksei Plotnikov V, MD  ipratropium (ATROVENT) 0.06 % nasal spray Place 2 sprays into the nose 3 (three) times daily. 01/02/14   Olga Millers, MD  metoprolol succinate (TOPROL-XL) 50 MG 24 hr tablet TAKE 1 TABLET (50 MG TOTAL) BY MOUTH 2 (TWO) TIMES DAILY. TAKE WITH OR IMMEDIATELY  FOLLOWING A MEAL. 03/25/14   Larey Dresser, MD  Multiple Vitamins-Minerals (CENTRUM SILVER PO) Take 1 tablet by mouth daily.    Historical Provider, MD  pantoprazole (PROTONIX) 40 MG tablet TAKE 1 TABLET EVERY MORNING    Hendricks Limes, MD  potassium chloride SA (K-DUR,KLOR-CON) 20 MEQ tablet TAKE 1 TABLET EVERY DAY 04/18/13   Neena Rhymes, MD  traMADol (ULTRAM) 50 MG tablet Take 1 tablet (50 mg total) by mouth every 6 (six) hours as needed. 01/07/14   Olga Millers, MD  XARELTO 15 MG TABS tablet TAKE 1 TABLET EVERY DAY 03/19/14   Larey Dresser, MD   BP 136/71 mmHg  Pulse 87  Resp 21  SpO2 100% Physical Exam  Constitutional: She is oriented to person, place, and time. She appears well-developed and well-nourished. No distress.  On spine board Appears uncomfortable.  HENT:  Head: Normocephalic and atraumatic.  Eyes: Conjunctivae are normal. No scleral icterus.  Neck: Normal range of motion. No JVD present.  Cardiovascular: Normal rate, regular rhythm and normal heart sounds.  Exam reveals no gallop and no friction rub.   No murmur heard. Pulmonary/Chest: Effort normal and breath sounds normal. No respiratory distress.  Abdominal: Soft. Bowel sounds are normal. She exhibits no distension and no mass. There is no tenderness. There is no guarding.  Musculoskeletal:       Left knee: She exhibits laceration.       Right upper arm: She exhibits tenderness, bony tenderness, edema and deformity. She exhibits no laceration.       Arms: Neurological: She is alert and oriented to person, place, and time.  Skin: Skin is warm and dry. She is not diaphoretic.  7 cm superficial skin tear to L knee, bleeding well controlled. Full strength with extension, no signs of tendon involvement.  Nursing note and vitals reviewed.   ED Course  Procedures (including critical care time) Labs Review Labs Reviewed  CBC  BASIC METABOLIC PANEL    Imaging Review No results found.    EKG  Interpretation None       LACERATION REPAIR Performed by: Margarita Mail Authorized by: Margarita Mail Consent: Verbal consent obtained. Risks and benefits: risks, benefits and alternatives were discussed Consent given by: patient Patient identity confirmed: provided demographic data Prepped and Draped in normal sterile fashion Wound explored  Laceration Location: Left knee  Laceration Length: 7 cm  No Foreign Bodies seen or palpated  Anesthesia: local infiltration  Local anesthetic: lidocaine 1 % w epinephrine  Anesthetic total: 10 ml  Irrigation method: syringe Amount of cleaning: standard  Skin closure: 5.0 prolene  Number of sutures: 7  Technique: 1 pulley stitch, 6 SI  Patient tolerance: Patient tolerated the procedure well with no  immediate complications.   SPLINT APPLICATION Date/Time: 82:42 AM Authorized by: Margarita Mail Consent: Verbal consent obtained. Risks and benefits: risks, benefits and alternatives were discussed Consent given by: patient Splint applied by: orthopedic technician Location details: R arm. Splint type: coaptation/ posterior Supplies used: fiberglass, padding, ace wrap Post-procedure: The splinted body part was neurovascularly unchanged following the procedure. Patient tolerance: Patient tolerated the procedure well with no immediate complications.    MDM   Final diagnoses:  Fall at home, initial encounter  Trauma  Displaced spiral fracture of shaft of humerus, right, closed, initial encounter  Laceration    Patient with Spiral fracture of the R humerus. CT head and neck negative, TDAP updated.  I spoke with Dr. Tonita Cong who is on call for [place] [facility]. He asks that the patient be placed in a R coaptation and posterior splint.   Splint placed and she is NVI. Patient Laceration repaired. The patient lives alone at home and will need assistance. Dr. Blaine Hamper will admit. Will hold xarelto and start the patient on Heparin.[facility] [facility]  will consult on the patient tomorrow to determine if she needs ORIF. Patient's cbc shows some anemia,  Labs are otherwise unremarkable. Appears safe for admission at this time.  Margarita Mail, PA-C 04/01/14 Pegram Alvino Chapel, MD 04/01/14 [id number]

## 2014-04-01 ENCOUNTER — Inpatient Hospital Stay (HOSPITAL_COMMUNITY): Payer: Medicare Other

## 2014-04-01 DIAGNOSIS — Z7901 Long term (current) use of anticoagulants: Secondary | ICD-10-CM | POA: Diagnosis not present

## 2014-04-01 DIAGNOSIS — I480 Paroxysmal atrial fibrillation: Secondary | ICD-10-CM | POA: Diagnosis not present

## 2014-04-01 DIAGNOSIS — W19XXXA Unspecified fall, initial encounter: Secondary | ICD-10-CM | POA: Diagnosis not present

## 2014-04-01 DIAGNOSIS — T1490 Injury, unspecified: Secondary | ICD-10-CM | POA: Diagnosis present

## 2014-04-01 DIAGNOSIS — G8918 Other acute postprocedural pain: Secondary | ICD-10-CM | POA: Diagnosis not present

## 2014-04-01 DIAGNOSIS — F411 Generalized anxiety disorder: Secondary | ICD-10-CM | POA: Diagnosis present

## 2014-04-01 DIAGNOSIS — S42411A Displaced simple supracondylar fracture without intercondylar fracture of right humerus, initial encounter for closed fracture: Secondary | ICD-10-CM | POA: Diagnosis not present

## 2014-04-01 DIAGNOSIS — Z8673 Personal history of transient ischemic attack (TIA), and cerebral infarction without residual deficits: Secondary | ICD-10-CM | POA: Diagnosis not present

## 2014-04-01 DIAGNOSIS — W06XXXD Fall from bed, subsequent encounter: Secondary | ICD-10-CM | POA: Diagnosis not present

## 2014-04-01 DIAGNOSIS — I739 Peripheral vascular disease, unspecified: Secondary | ICD-10-CM | POA: Diagnosis present

## 2014-04-01 DIAGNOSIS — S42343A Displaced spiral fracture of shaft of humerus, unspecified arm, initial encounter for closed fracture: Secondary | ICD-10-CM | POA: Insufficient documentation

## 2014-04-01 DIAGNOSIS — S42401D Unspecified fracture of lower end of right humerus, subsequent encounter for fracture with routine healing: Secondary | ICD-10-CM | POA: Diagnosis not present

## 2014-04-01 DIAGNOSIS — S42341A Displaced spiral fracture of shaft of humerus, right arm, initial encounter for closed fracture: Secondary | ICD-10-CM

## 2014-04-01 DIAGNOSIS — M79601 Pain in right arm: Secondary | ICD-10-CM | POA: Diagnosis not present

## 2014-04-01 DIAGNOSIS — S42301A Unspecified fracture of shaft of humerus, right arm, initial encounter for closed fracture: Secondary | ICD-10-CM

## 2014-04-01 DIAGNOSIS — I5032 Chronic diastolic (congestive) heart failure: Secondary | ICD-10-CM | POA: Diagnosis not present

## 2014-04-01 DIAGNOSIS — Z8711 Personal history of peptic ulcer disease: Secondary | ICD-10-CM | POA: Diagnosis not present

## 2014-04-01 DIAGNOSIS — S42331A Displaced oblique fracture of shaft of humerus, right arm, initial encounter for closed fracture: Secondary | ICD-10-CM | POA: Diagnosis not present

## 2014-04-01 DIAGNOSIS — L089 Local infection of the skin and subcutaneous tissue, unspecified: Secondary | ICD-10-CM | POA: Diagnosis present

## 2014-04-01 DIAGNOSIS — Z880 Allergy status to penicillin: Secondary | ICD-10-CM | POA: Diagnosis not present

## 2014-04-01 DIAGNOSIS — K449 Diaphragmatic hernia without obstruction or gangrene: Secondary | ICD-10-CM | POA: Diagnosis present

## 2014-04-01 DIAGNOSIS — W1830XA Fall on same level, unspecified, initial encounter: Secondary | ICD-10-CM | POA: Diagnosis present

## 2014-04-01 DIAGNOSIS — S42302A Unspecified fracture of shaft of humerus, left arm, initial encounter for closed fracture: Secondary | ICD-10-CM | POA: Diagnosis not present

## 2014-04-01 DIAGNOSIS — Z9181 History of falling: Secondary | ICD-10-CM | POA: Diagnosis not present

## 2014-04-01 DIAGNOSIS — S81012A Laceration without foreign body, left knee, initial encounter: Secondary | ICD-10-CM | POA: Diagnosis not present

## 2014-04-01 DIAGNOSIS — D649 Anemia, unspecified: Secondary | ICD-10-CM | POA: Diagnosis not present

## 2014-04-01 DIAGNOSIS — Y92013 Bedroom of single-family (private) house as the place of occurrence of the external cause: Secondary | ICD-10-CM | POA: Diagnosis not present

## 2014-04-01 DIAGNOSIS — K219 Gastro-esophageal reflux disease without esophagitis: Secondary | ICD-10-CM | POA: Diagnosis present

## 2014-04-01 DIAGNOSIS — R937 Abnormal findings on diagnostic imaging of other parts of musculoskeletal system: Secondary | ICD-10-CM | POA: Diagnosis not present

## 2014-04-01 DIAGNOSIS — S42409A Unspecified fracture of lower end of unspecified humerus, initial encounter for closed fracture: Secondary | ICD-10-CM | POA: Diagnosis not present

## 2014-04-01 DIAGNOSIS — I10 Essential (primary) hypertension: Secondary | ICD-10-CM | POA: Diagnosis not present

## 2014-04-01 DIAGNOSIS — M6281 Muscle weakness (generalized): Secondary | ICD-10-CM | POA: Diagnosis not present

## 2014-04-01 DIAGNOSIS — Z87891 Personal history of nicotine dependence: Secondary | ICD-10-CM | POA: Diagnosis not present

## 2014-04-01 DIAGNOSIS — I2510 Atherosclerotic heart disease of native coronary artery without angina pectoris: Secondary | ICD-10-CM | POA: Diagnosis present

## 2014-04-01 DIAGNOSIS — R2681 Unsteadiness on feet: Secondary | ICD-10-CM | POA: Diagnosis not present

## 2014-04-01 LAB — BASIC METABOLIC PANEL
ANION GAP: 13 (ref 5–15)
BUN: 16 mg/dL (ref 6–23)
CO2: 24 mEq/L (ref 19–32)
Calcium: 9.6 mg/dL (ref 8.4–10.5)
Chloride: 103 mEq/L (ref 96–112)
Creatinine, Ser: 0.75 mg/dL (ref 0.50–1.10)
GFR calc Af Amer: 88 mL/min — ABNORMAL LOW (ref >90)
GFR, EST NON AFRICAN AMERICAN: 76 mL/min — AB (ref >90)
Glucose, Bld: 113 mg/dL — ABNORMAL HIGH (ref 70–99)
Potassium: 4.6 mEq/L (ref 3.7–5.3)
SODIUM: 140 meq/L (ref 137–147)

## 2014-04-01 LAB — GLUCOSE, CAPILLARY: GLUCOSE-CAPILLARY: 91 mg/dL (ref 70–99)

## 2014-04-01 LAB — CBC
HCT: 33.4 % — ABNORMAL LOW (ref 36.0–46.0)
Hemoglobin: 10.7 g/dL — ABNORMAL LOW (ref 12.0–15.0)
MCH: 28.7 pg (ref 26.0–34.0)
MCHC: 32 g/dL (ref 30.0–36.0)
MCV: 89.5 fL (ref 78.0–100.0)
PLATELETS: 237 10*3/uL (ref 150–400)
RBC: 3.73 MIL/uL — ABNORMAL LOW (ref 3.87–5.11)
RDW: 16.4 % — ABNORMAL HIGH (ref 11.5–15.5)
WBC: 8.1 10*3/uL (ref 4.0–10.5)

## 2014-04-01 LAB — PROTIME-INR
INR: 2.48 — AB (ref 0.00–1.49)
Prothrombin Time: 27.1 seconds — ABNORMAL HIGH (ref 11.6–15.2)

## 2014-04-01 LAB — VITAMIN B12: VITAMIN B 12: 640 pg/mL (ref 211–911)

## 2014-04-01 LAB — APTT: APTT: 42 s — AB (ref 24–37)

## 2014-04-01 LAB — HEPARIN LEVEL (UNFRACTIONATED): Heparin Unfractionated: >2.2 IU/mL — ABNORMAL HIGH (ref 0.30–0.70)

## 2014-04-01 MED ORDER — CEFAZOLIN SODIUM 1-5 GM-% IV SOLN
1.0000 g | Freq: Three times a day (TID) | INTRAVENOUS | Status: DC
  Administered 2014-04-01 – 2014-04-02 (×4): 1 g via INTRAVENOUS
  Administered 2014-04-03: 2 g via INTRAVENOUS
  Administered 2014-04-03 (×2): 1 g via INTRAVENOUS
  Filled 2014-04-01 (×10): qty 50

## 2014-04-01 MED ORDER — HEPARIN (PORCINE) IN NACL 100-0.45 UNIT/ML-% IJ SOLN
850.0000 [IU]/h | INTRAMUSCULAR | Status: DC
  Administered 2014-04-01 – 2014-04-02 (×2): 850 [IU]/h via INTRAVENOUS
  Filled 2014-04-01 (×4): qty 250

## 2014-04-01 MED ORDER — VITAMIN D3 25 MCG (1000 UNIT) PO TABS
5000.0000 [IU] | ORAL_TABLET | Freq: Every day | ORAL | Status: DC
  Administered 2014-04-03 – 2014-04-07 (×5): 5000 [IU] via ORAL
  Filled 2014-04-01 (×7): qty 5

## 2014-04-01 MED ORDER — HYDROMORPHONE HCL 1 MG/ML IJ SOLN
1.0000 mg | INTRAMUSCULAR | Status: DC | PRN
  Administered 2014-04-01 – 2014-04-04 (×5): 1 mg via INTRAVENOUS
  Filled 2014-04-01 (×5): qty 1

## 2014-04-01 MED ORDER — HYDROCODONE-ACETAMINOPHEN 7.5-325 MG PO TABS
1.0000 | ORAL_TABLET | Freq: Four times a day (QID) | ORAL | Status: DC | PRN
  Administered 2014-04-01 – 2014-04-07 (×18): 1 via ORAL
  Filled 2014-04-01 (×20): qty 1

## 2014-04-01 MED ORDER — METOPROLOL SUCCINATE ER 50 MG PO TB24
50.0000 mg | ORAL_TABLET | Freq: Every day | ORAL | Status: DC
  Administered 2014-04-01 – 2014-04-07 (×6): 50 mg via ORAL
  Filled 2014-04-01 (×7): qty 1

## 2014-04-01 MED ORDER — DILTIAZEM HCL ER 90 MG PO CP12
180.0000 mg | ORAL_CAPSULE | Freq: Two times a day (BID) | ORAL | Status: DC
  Administered 2014-04-01 – 2014-04-07 (×10): 180 mg via ORAL
  Filled 2014-04-01 (×16): qty 2

## 2014-04-01 MED ORDER — SODIUM CHLORIDE 0.9 % IJ SOLN
3.0000 mL | Freq: Two times a day (BID) | INTRAMUSCULAR | Status: DC
  Administered 2014-04-01 – 2014-04-02 (×3): 3 mL via INTRAVENOUS

## 2014-04-01 MED ORDER — CLONIDINE HCL 0.1 MG PO TABS
0.1000 mg | ORAL_TABLET | ORAL | Status: DC | PRN
  Filled 2014-04-01: qty 1

## 2014-04-01 MED ORDER — ADULT MULTIVITAMIN W/MINERALS CH
1.0000 | ORAL_TABLET | Freq: Every day | ORAL | Status: DC
  Administered 2014-04-01 – 2014-04-07 (×7): 1 via ORAL
  Filled 2014-04-01 (×7): qty 1

## 2014-04-01 MED ORDER — PANTOPRAZOLE SODIUM 40 MG PO TBEC
40.0000 mg | DELAYED_RELEASE_TABLET | Freq: Every day | ORAL | Status: DC
  Administered 2014-04-01 – 2014-04-07 (×7): 40 mg via ORAL
  Filled 2014-04-01 (×7): qty 1

## 2014-04-01 MED ORDER — CLOBETASOL PROPIONATE 0.05 % EX CREA
TOPICAL_CREAM | Freq: Two times a day (BID) | CUTANEOUS | Status: DC
  Administered 2014-04-03 – 2014-04-04 (×2): via TOPICAL
  Filled 2014-04-01: qty 15

## 2014-04-01 MED ORDER — ALPRAZOLAM 0.25 MG PO TABS
0.1250 mg | ORAL_TABLET | ORAL | Status: DC
  Administered 2014-04-01 – 2014-04-07 (×38): 0.125 mg via ORAL
  Filled 2014-04-01 (×36): qty 1

## 2014-04-01 NOTE — Progress Notes (Signed)
Utilization review completed.  

## 2014-04-01 NOTE — H&P (Signed)
[facility] History and Physical  Jacqueline [name] AQT:[id number] DOB: 02-28-1929 DOA: 03/31/2014  Referring physician: ED physician PCP: [name], MD  Specialists:   Chief Complaint: right arm and left knee pain after fall  HPI: Jacqueline [name] is a 80 y.o. female with past medical history of peptic ulcer disease, hypertension, GERD, paroxysmal atrial fibrillation, history of stroke, coronary artery disease, diastolic congestive heart failure, who presents with right arm and left knee pain after fall.  Patient reports that she was eating at the bedside. Because of room was dark, she stood up and tried to turn on the light, unfortunately she lost her balance and had a fall. She injured her right arm and had a skin tear over left knee joints. She has severe pain over right arm, right shoulder and left knee joint. Patient denies any dizziness, seizures, palpitation, SOB or chest pain when event happened.  She dose not have fever, chills, fatigue, headaches, cough, chest pain, SOB, abdominal pain, diarrhea, constipation, dysuria, urgency, frequency, hematuria, skin rashes.  She states that she has been having tingling sensations in her right arm for more than one year, and recently she started having tingling sensations in left arm in the past 6 weeks. She does not have weakness in her extremities.  Work up in the ED demonstrates distal humeral spiral fracture with 1.2 cm lateral and anterior displacement on X-ray. Ct-head and C spin is negative for acute abnormalities. Orthopedics was consulted by ED.  Review of Systems: As presented in the history of presenting illness, rest negative.  Where does patient live?  At home Can patient participate in ADLs? partially   Allergy:  Allergies  Allergen Reactions  . Metoclopramide Hcl Other (See Comments)     Tremors  . Penicillins Itching  . Aspirin Other (See Comments)    "gave me stomach aches; made me bleed" (03/13/12)  . Zetia [Ezetimibe]  Other (See Comments)    Stomach and back pain    Past Medical History  Diagnosis Date  . Allergy, unspecified not elsewhere classified   . Other dyspnea and respiratory abnormality   . Other diseases of lung, not elsewhere classified   . Anemia, unspecified   . Unspecified adverse effect of other drug, medicinal and biological substance(995.29)   . Blood in stool   . Hemorrhage of gastrointestinal tract, unspecified   . Personal history of peptic ulcer disease   . Anxiety state, unspecified   . Personal history of other diseases of digestive system   . Unspecified essential hypertension   . Complication of anesthesia     "hard to wake me up after colonoscopy" (03/13/12)  . H/O hiatal hernia   . GERD (gastroesophageal reflux disease)   . Unspecified paranoid state   . Atrial fibrillation with rapid ventricular response 03/13/12  . Osteoarthrosis, unspecified whether generalized or localized, lower leg   . Degeneration of lumbar or lumbosacral intervertebral disc   . Unspecified cerebral artery occlusion with cerebral infarction 10/2008    "mini stroke" denies residual (03/13/12)  . History of IBS     Past Surgical History  Procedure Laterality Date  . Pilonidal cyst / sinus excision  1957  . Dilation and curettage of uterus  1950's?  . Shoulder arthroscopy  ~ 2000    right    Social History:  reports that she quit smoking about 11 years ago. Her smoking use included Cigarettes. She has a 40 pack-year smoking history. She has never used smokeless tobacco. She reports that  she does not drink alcohol or use illicit drugs.  Family History:  Family History  Problem Relation Age of Onset  . Diabetes Mother   . CVA Mother   . CVA Father      Prior to Admission medications   Medication Sig Start Date End Date Taking? Authorizing Provider  ALPRAZolam (XANAX) 0.25 MG tablet Take 0.5 tablets (0.125 mg total) by mouth every 3 (three) hours. Patient taking differently: Take  0.125-0.25 mg by mouth 4 (four) times daily as needed.  12/19/13  Yes Aleksei Plotnikov V, MD  Cholecalciferol (VITAMIN D3) 5000 UNITS CAPS Take 5,000 Units by mouth daily.   Yes Historical Provider, MD  DILTIAZEM HCL ER PO Take 180 mg by mouth 2 (two) times daily. Before meals   Yes Historical Provider, MD  furosemide (LASIX) 40 MG tablet Take 1 tablet (40 mg total) by mouth daily. Patient taking differently: Take 40 mg by mouth daily as needed for fluid.  01/31/14  Yes Larey Dresser, MD  HYDROcodone-acetaminophen (NORCO/VICODIN) 5-325 MG per tablet Take 1 tablet by mouth every 6 (six) hours.   Yes Historical Provider, MD  pantoprazole (PROTONIX) 40 MG tablet TAKE 1 TABLET EVERY MORNING   Yes Hendricks Limes, MD  potassium chloride SA (K-DUR,KLOR-CON) 20 MEQ tablet TAKE 1 TABLET EVERY DAY 04/18/13  Yes Neena Rhymes, MD  traMADol (ULTRAM) 50 MG tablet Take 1 tablet (50 mg total) by mouth every 6 (six) hours as needed. Patient taking differently: Take 50 mg by mouth every 6 (six) hours as needed for moderate pain.  01/07/14  Yes Olga Millers, MD  XARELTO 15 MG TABS tablet TAKE 1 TABLET EVERY DAY 03/19/14  Yes Larey Dresser, MD  clobetasol cream (TEMOVATE) 0.05 % Apply to rash on left foot two times a day 10/01/13   Wellington Hampshire, MD  clobetasol cream (TEMOVATE) 0.05 % Apply to rash on left foot two times a day 10/01/13   Wellington Hampshire, MD  cloNIDine (CATAPRES) 0.1 MG tablet Take 1 tablet (0.1 mg total) by mouth every 2 (two) hours as needed (take as needed for a  Blood pressure that is greater than 160 on top, or bottom number greater than 100.). 12/04/12   Neena Rhymes, MD  HYDROcodone-acetaminophen ([place]) 7.5-325 MG per tablet 1 tablet two times a day as needed for pain Patient not taking: Reported on 03/31/2014 12/19/13   Aleksei Plotnikov V, MD  ipratropium (ATROVENT) 0.06 % nasal spray Place 2 sprays into the nose 3 (three) times daily. 01/02/14   Olga Millers, MD  metoprolol  succinate (TOPROL-XL) 50 MG 24 hr tablet TAKE 1 TABLET (50 MG TOTAL) BY MOUTH 2 (TWO) TIMES DAILY. TAKE WITH OR IMMEDIATELY FOLLOWING A MEAL. 03/25/14   Larey Dresser, MD  Multiple Vitamins-Minerals (CENTRUM SILVER PO) Take 1 tablet by mouth daily.    Historical Provider, MD    Physical Exam: Filed Vitals:   03/31/14 2215 03/31/14 2233 03/31/14 2235 03/31/14 2245  BP: 108/68 112/59 112/59 122/95  Pulse: 91 91 91 94  Resp: 16 13 16 23   SpO2: 93% 95% 96% 97%   General: Not in acute distress HEENT:       Eyes: PERRL, EOMI, no scleral icterus       ENT: No discharge from the ears and nose, no pharynx injection, no tonsillar enlargement.        Neck: No JVD, no bruit, no mass felt. Cardiac: S1/S2, RRR, No murmurs, No  gallops or rubs Pulm: Good air movement bilaterally. Clear to auscultation bilaterally. No rales, wheezing, rhonchi or rubs. Abd: Soft, nondistended, nontender, no rebound pain, no organomegaly, BS present Ext: No edema bilaterally. 2+DP/PT pulse bilaterally. 7 cm superficial skin tear to L knee, bleeding is well controlled with stiches. Full strength with extension, no signs of tendon involvement.  Musculoskeletal: There is bony tenderness, edema and deformity over right arm Skin: No rashes.  Neuro: Alert and oriented X3, cranial nerves II-XII grossly intact, muscle strength 5/5 in all extremeties, sensation to light touch intact. Moves all extremities. Psych: Patient is not psychotic, no suicidal or hemocidal ideation.  Labs on Admission:  Basic Metabolic Panel:  Recent Labs Lab 03/31/14 1959  NA 142  K 4.6  CL 105  CO2 24  GLUCOSE 109*  BUN 16  CREATININE 0.69  CALCIUM 9.4   Liver Function Tests: No results for input(s): AST, ALT, ALKPHOS, BILITOT, PROT, ALBUMIN in the last 168 hours. No results for input(s): LIPASE, AMYLASE in the last 168 hours. No results for input(s): AMMONIA in the last 168 hours. CBC:  Recent Labs Lab 03/31/14 1959  WBC 6.1  HGB  11.8*  HCT 35.9*  MCV 90.7  PLT 259   Cardiac Enzymes: No results for input(s): CKTOTAL, CKMB, CKMBINDEX, TROPONINI in the last 168 hours.  BNP (last 3 results)  Recent Labs  04/01/13 1548  PROBNP 160.0*   CBG: No results for input(s): GLUCAP in the last 168 hours.  Radiological Exams on Admission: Dg Shoulder Right  03/31/2014   CLINICAL DATA:  Status post fall at home today, with acute onset of right shoulder pain. Initial encounter.  EXAM: RIGHT SHOULDER - 1 VIEW  COMPARISON:  None.  FINDINGS: Only a single view could be obtained due to limitations in patient positioning.  There is no definite evidence of fracture or dislocation.  Degenerative change is noted at the right glenohumeral joint, with joint space narrowing and sclerotic change. There is mild superior subluxation of the right humeral head. The right acromioclavicular joint is grossly unremarkable in appearance.  No significant soft tissue abnormalities are seen. The visualized portions of the lungs are clear.  IMPRESSION: No definite evidence of fracture or dislocation at the right glenohumeral joint. Degenerative change noted at the right glenohumeral joint.   Electronically Signed   By: Garald Balding M.D.   On: 03/31/2014 21:26   Ct Head Wo Contrast  03/31/2014   CLINICAL DATA:  Acute onset of dizziness. Fell forward while sitting on edge of bed. Headache. No loss of consciousness. Initial encounter.  EXAM: CT HEAD WITHOUT CONTRAST  CT CERVICAL SPINE WITHOUT CONTRAST  TECHNIQUE: Multidetector CT imaging of the head and cervical spine was performed following the standard protocol without intravenous contrast. Multiplanar CT image reconstructions of the cervical spine were also generated.  COMPARISON:  None.  FINDINGS: CT HEAD FINDINGS  There is no evidence of acute infarction, mass lesion, or intra- or extra-axial hemorrhage on CT.  Prominence of the ventricles and sulci reflects moderate cortical volume loss. Mild  periventricular and subcortical white matter change likely reflects small vessel ischemic microangiopathy. Mild cerebellar atrophy is noted. A small chronic lacunar infarct is noted at the left cerebellar hemisphere.  The brainstem and fourth ventricle are within normal limits. The basal ganglia are unremarkable in appearance. The cerebral hemispheres demonstrate grossly normal gray-white differentiation. No mass effect or midline shift is seen.  There is no evidence of fracture; visualized osseous structures are unremarkable  in appearance. The orbits are within normal limits. The paranasal sinuses and mastoid air cells are well-aerated. Cerumen is noted filling the right external auditory canal.  CT CERVICAL SPINE FINDINGS  There is no evidence of acute fracture or subluxation. There is grade 1 anterolisthesis of C4 on C5, and grade 1 anterolisthesis of C7 on T1. Multilevel disc space narrowing is noted along the cervical and upper thoracic spine, with scattered anterior and posterior disc osteophyte complexes. Underlying facet disease is noted. Prevertebral soft tissues are within normal limits.  The thyroid gland is unremarkable in appearance. The visualized lung apices are clear. Diffuse calcification is seen along the aortic arch and great vessels. Retropharyngeal common carotid arteries are noted. Calcification is seen at the carotid bifurcations bilaterally.  IMPRESSION: 1. No evidence of traumatic intracranial injury or fracture. 2. No evidence of acute fracture or subluxation along the cervical spine. 3. Moderate cortical volume loss and scattered small vessel ischemic microangiopathy. Small chronic lacunar infarct at the left cerebellar hemisphere. 4. Degenerative change noted along the cervical and upper thoracic spine. 5. Diffuse calcification along the aortic arch and great vessels. Calcification noted at the carotid bifurcations bilaterally. Carotid ultrasound would be helpful for further evaluation,  when and as deemed clinically appropriate. 6. Cerumen noted filling the right external auditory canal.   Electronically Signed   By: Garald Balding M.D.   On: 03/31/2014 22:37   Ct Cervical Spine Wo Contrast  03/31/2014   CLINICAL DATA:  Acute onset of dizziness. Fell forward while sitting on edge of bed. Headache. No loss of consciousness. Initial encounter.  EXAM: CT HEAD WITHOUT CONTRAST  CT CERVICAL SPINE WITHOUT CONTRAST  TECHNIQUE: Multidetector CT imaging of the head and cervical spine was performed following the standard protocol without intravenous contrast. Multiplanar CT image reconstructions of the cervical spine were also generated.  COMPARISON:  None.  FINDINGS: CT HEAD FINDINGS  There is no evidence of acute infarction, mass lesion, or intra- or extra-axial hemorrhage on CT.  Prominence of the ventricles and sulci reflects moderate cortical volume loss. Mild periventricular and subcortical white matter change likely reflects small vessel ischemic microangiopathy. Mild cerebellar atrophy is noted. A small chronic lacunar infarct is noted at the left cerebellar hemisphere.  The brainstem and fourth ventricle are within normal limits. The basal ganglia are unremarkable in appearance. The cerebral hemispheres demonstrate grossly normal gray-white differentiation. No mass effect or midline shift is seen.  There is no evidence of fracture; visualized osseous structures are unremarkable in appearance. The orbits are within normal limits. The paranasal sinuses and mastoid air cells are well-aerated. Cerumen is noted filling the right external auditory canal.  CT CERVICAL SPINE FINDINGS  There is no evidence of acute fracture or subluxation. There is grade 1 anterolisthesis of C4 on C5, and grade 1 anterolisthesis of C7 on T1. Multilevel disc space narrowing is noted along the cervical and upper thoracic spine, with scattered anterior and posterior disc osteophyte complexes. Underlying facet disease is  noted. Prevertebral soft tissues are within normal limits.  The thyroid gland is unremarkable in appearance. The visualized lung apices are clear. Diffuse calcification is seen along the aortic arch and great vessels. Retropharyngeal common carotid arteries are noted. Calcification is seen at the carotid bifurcations bilaterally.  IMPRESSION: 1. No evidence of traumatic intracranial injury or fracture. 2. No evidence of acute fracture or subluxation along the cervical spine. 3. Moderate cortical volume loss and scattered small vessel ischemic microangiopathy. Small chronic lacunar infarct at  the left cerebellar hemisphere. 4. Degenerative change noted along the cervical and upper thoracic spine. 5. Diffuse calcification along the aortic arch and great vessels. Calcification noted at the carotid bifurcations bilaterally. Carotid ultrasound would be helpful for further evaluation, when and as deemed clinically appropriate. 6. Cerumen noted filling the right external auditory canal.   Electronically Signed   By: Garald Balding M.D.   On: 03/31/2014 22:37   Dg Humerus Right  03/31/2014   CLINICAL DATA:  Fall with right arm pain.  Initial encounter.  EXAM: RIGHT HUMERUS - 2+ VIEW  COMPARISON:  None.  FINDINGS: A spiral type fracture of the distal humerus is noted with 1.2 cm lateral and anterior displacement.  There is no evidence of subluxation or dislocation.  Severe degenerative changes at the glenohumeral joint noted.  IMPRESSION: Distal humeral spiral fracture with 1.2 cm lateral and anterior displacement.   Electronically Signed   By: Hassan Rowan M.D.   On: 03/31/2014 21:26    Assessment/Plan Principal Problem:   Right arm fracture Active Problems:   Essential hypertension   CVA (cerebral infarction)   Osteoarthritis   History of peptic ulcer disease   PAF (paroxysmal atrial fibrillation)   Chronic diastolic CHF (congestive heart failure)  Right arm fracture: CXR showed distal humeral spiral fracture  with 1.2 cm lateral and anterior displacement. No neurovascular compromise. ED discussed with Dr. Tonita Cong who would like to discuss with colleague to decide next plan. Applied fiberglass coaptation splint and fiberglass long arm splint to RUE in ED -will admit to tele bed given hx of A fib -pain control: dilaudid and Norco  -follow up ortho recs  PAF:  Heart rate is well controlled. On Xarelto at home. -will switch Xarlto to heparin gtt. This was discussed with Dr. Tonita Cong - continue Cardizem, metoprolol,  Diastolic CHF: 2-D echo on 11/27/12 showed EF 55-60%. Patient is taking Lasix 40 mg when necessary, last dose was 1 week ago. Currently patient is euvolemic on admission. No leg edema. -will hold lasix - check Pro BNP  HTN:  -Continue clonidine -Hold Lasix as above -Patient is also Cardizem and metoprolol for A. Fib  Hx of PUD:  -continue Protonix  Tingling sensation in both arms: Etiology is not clear. No weakness, unlikely due to stroke given bilateral nature -will check vitamin b12 level   DVT ppx: Heparin IV  Code Status: Full code (I discussed with patient, that she is not ready to answer this question).  Family Communication: None at bed side.    Disposition Plan: Admit to inpatient   Date of Service 04/01/2014    Ivor Costa [facility] Pager [phone]  If 7PM-7AM, please contact night-coverage [email or web address] Password [email or web address] 04/01/2014, 12:26 AM

## 2014-04-01 NOTE — Progress Notes (Addendum)
[facility] Brief Update Note  S: Patient reports falling and right arm pain.  Arm splinted in the ED  O:  Filed Vitals:   04/01/14 0549  BP: 127/62  Pulse: 88  Temp: 98.4 F (36.9 C)  Resp:    Gen: Lying in bed, minimal distress  CVS: RR, NR, no murmur Pulm: CTAB, no wheeze Abd: Soft, NT, ND, +BS Ext: RUE in splint and wrap, anterior patellar laceration on the left knee Neuro: Grossly intact  Assessment: 80yo woman with acute humeral spiral fracture on the right.   Plan - Ct of arm planned by Surgery - Patient requesting to see Dr. Veverly Fells who is her outpatient orthopedic surgeon - Will likely need surgical repair - Pain control with dilaudid and norco currently - Will need bowel regimen - Admission H&P reports need for heparin ggt for bridging while off xarelto (she has a Chads2 score of 5, making bridging reasonable), order for pharmacy consult placed, but no official orders.  Will follow up with pharmacy.  - Monitor knee wound closely for infection, no indication for abx as the wound was repaired with suturing and not related to an animal or human bite.  If signs/symptoms of cellulitis or infection will start Abx.   Gilles Chiquito, MD

## 2014-04-01 NOTE — Progress Notes (Signed)
Orthopedics Progress Note  Subjective: Patient complains of right arm pain  Objective:  Filed Vitals:   04/01/14 0549  BP: 127/62  Pulse: 88  Temp: 98.4 F (36.9 C)  Resp:     General: Awake and alert  Musculoskeletal: right UE splinted above and below the elbow.  Some swelling in the fingers likely due to the splint and injury. Patient able to wiggle the fingers, sensation intact Large anterior prepatellar laceration, sutured, AROM in the knee and ankle without pain Neurovascularly intact  Lab Results  Component Value Date   WBC 8.1 04/01/2014   HGB 10.7* 04/01/2014   HCT 33.4* 04/01/2014   MCV 89.5 04/01/2014   PLT 237 04/01/2014       Component Value Date/Time   NA 140 04/01/2014 0400   K 4.6 04/01/2014 0400   CL 103 04/01/2014 0400   CO2 24 04/01/2014 0400   GLUCOSE 113* 04/01/2014 0400   BUN 16 04/01/2014 0400   CREATININE 0.75 04/01/2014 0400   CALCIUM 9.6 04/01/2014 0400   GFRNONAA 76* 04/01/2014 0400   GFRAA 88* 04/01/2014 0400    Lab Results  Component Value Date   INR 2.48* 04/01/2014   INR 1.4* 07/15/2013   INR 0.99 01/13/2013    Assessment/Plan:  s/p  Fall with right supracondylar distal humerus fracture - will require ORIF. I have contacted Dr Roseanne Kaufman who has agreed to consult on her case.  Surgical plan to follow, possibly Thursday.  Immobilization and pain control for now. Bedrest for now, as patient at very high fall risk.   Doran Heater. Veverly Fells, MD 04/01/2014 12:31 PM

## 2014-04-01 NOTE — Progress Notes (Signed)
ANTICOAGULATION CONSULT NOTE - Initial Consult  Pharmacy Consult for Heparin Indication: atrial fibrillation  Allergies  Allergen Reactions  . Metoclopramide Hcl Other (See Comments)     Tremors  . Penicillins Itching  . Aspirin Other (See Comments)    "gave me stomach aches; made me bleed" (03/13/12)  . Zetia [Ezetimibe] Other (See Comments)    Stomach and back pain    Patient Measurements:   Heparin Dosing Weight:   Vital Signs: Temp: 98.2 F (36.8 C) (12/01 0205) Temp Source: Oral (12/01 0205) BP: 106/66 mmHg (12/01 0205) Pulse Rate: 97 (12/01 0205)  Labs:  Recent Labs  03/31/14 1959  HGB 11.8*  HCT 35.9*  PLT 259  CREATININE 0.69    CrCl cannot be calculated (Unknown ideal weight.).   Medical History: Past Medical History  Diagnosis Date  . Allergy, unspecified not elsewhere classified   . Other dyspnea and respiratory abnormality   . Other diseases of lung, not elsewhere classified   . Anemia, unspecified   . Unspecified adverse effect of other drug, medicinal and biological substance(995.29)   . Blood in stool   . Hemorrhage of gastrointestinal tract, unspecified   . Personal history of peptic ulcer disease   . Anxiety state, unspecified   . Personal history of other diseases of digestive system   . Unspecified essential hypertension   . Complication of anesthesia     "hard to wake me up after colonoscopy" (03/13/12)  . H/O hiatal hernia   . GERD (gastroesophageal reflux disease)   . Unspecified paranoid state   . Atrial fibrillation with rapid ventricular response 03/13/12  . Osteoarthrosis, unspecified whether generalized or localized, lower leg   . Degeneration of lumbar or lumbosacral intervertebral disc   . Unspecified cerebral artery occlusion with cerebral infarction 10/2008    "mini stroke" denies residual (03/13/12)  . History of IBS     Medications:  Prescriptions prior to admission  Medication Sig Dispense Refill Last Dose  .  ALPRAZolam (XANAX) 0.25 MG tablet Take 0.5 tablets (0.125 mg total) by mouth every 3 (three) hours. (Patient taking differently: Take 0.125-0.25 mg by mouth 4 (four) times daily as needed. ) 180 tablet 0 03/31/2014 at Unknown time  . Cholecalciferol (VITAMIN D3) 5000 UNITS CAPS Take 5,000 Units by mouth daily.   03/31/2014 at Unknown time  . cloNIDine (CATAPRES) 0.1 MG tablet Take 1 tablet (0.1 mg total) by mouth every 2 (two) hours as needed (take as needed for a  Blood pressure that is greater than 160 on top, or bottom number greater than 100.). 30 tablet 3 last year  . DILTIAZEM HCL ER PO Take 180 mg by mouth 2 (two) times daily. Before meals   03/31/2014 at Unknown time  . furosemide (LASIX) 40 MG tablet Take 1 tablet (40 mg total) by mouth daily. (Patient taking differently: Take 40 mg by mouth daily as needed for fluid. ) 30 tablet 0 Past Week at Unknown time  . HYDROcodone-acetaminophen (NORCO/VICODIN) 5-325 MG per tablet Take 1 tablet by mouth 4 (four) times daily. 6 am, NOON, 6pm and MIDNIGHT   03/31/2014 at Unknown time  . ipratropium (ATROVENT) 0.06 % nasal spray Place 2 sprays into the nose 3 (three) times daily. 45 mL 0 03/31/2014 at Unknown time  . metoprolol succinate (TOPROL-XL) 50 MG 24 hr tablet TAKE 1 TABLET (50 MG TOTAL) BY MOUTH 2 (TWO) TIMES DAILY. TAKE WITH OR IMMEDIATELY FOLLOWING A MEAL. 60 tablet 0 03/31/2014 at 1600  . Multiple  Vitamins-Minerals (CENTRUM SILVER PO) Take 1 tablet by mouth daily.   03/31/2014 at Unknown time  . pantoprazole (PROTONIX) 40 MG tablet TAKE 1 TABLET EVERY MORNING 30 tablet 0 03/31/2014 at Unknown time  . potassium chloride SA (K-DUR,KLOR-CON) 20 MEQ tablet TAKE 1 TABLET EVERY DAY 90 tablet 3 03/31/2014 at Unknown time  . traMADol (ULTRAM) 50 MG tablet Take 1 tablet (50 mg total) by mouth every 6 (six) hours as needed. (Patient taking differently: Take 50 mg by mouth every 6 (six) hours as needed for moderate pain. ) 30 tablet 0 unknown  . XARELTO 15  MG TABS tablet TAKE 1 TABLET EVERY DAY 90 tablet 0 03/31/2014 at Unknown time    Assessment: 80 yo female with right arm fracture, h/o Afib and Xarelto on hold, for heparin.  Last dose of Xarelto 11/30, though patient not able to say what time.  Goal of Therapy:  APTT 66-102 Heparin level 0.3-0.7 units/ml Monitor platelets by anticoagulation protocol: Yes   Plan:  Check baseline heparin level and aPTT with labs this morning.  If heparin level appropriately elevated, will retry to clarify with patient/family Xarelto timing, with plans to start heparin 24 hours after last dose.  Caryl Pina 04/01/2014,2:22 AM

## 2014-04-01 NOTE — Progress Notes (Signed)
PT Cancellation Note  Patient Details Name: Jacqueline [name] MRN: [id number] DOB: [birth year]   Cancelled Treatment:    Reason Eval/Treat Not Completed: Patient not medically ready; patient currently on bedrest and awaiting ortho recommendations for humerus fracture.  Will await clearance prior to initiating therapy.   Alric Geise,CYNDI 04/01/2014, 11:07 AM

## 2014-04-01 NOTE — Consult Note (Signed)
Reason for Consult:right humerus fracture SP fall Referring Physician: [name] is an 80 y.o. female.  HPI: presents after a fall with right displaced humerus fracture. Patient complains of pain in the right humerus. Patient denies prior history of fracture here.  I was asked to see the patient by Dr. Alma [name] my partner.  Patient states she had a left knee laceration. She is not currently on antibiotics for the knee it appears.  Patient and I discussed all issues in detail.  She denies neck or back pain.  Her living situation is independent at present time however she does not feel comfortable returning to that environment immediately. I would concur.   Past Medical History  Diagnosis Date  . Allergy, unspecified not elsewhere classified   . Other dyspnea and respiratory abnormality   . Other diseases of lung, not elsewhere classified   . Anemia, unspecified   . Unspecified adverse effect of other drug, medicinal and biological substance(995.29)   . Blood in stool   . Hemorrhage of gastrointestinal tract, unspecified   . Personal history of peptic ulcer disease   . Anxiety state, unspecified   . Personal history of other diseases of digestive system   . Unspecified essential hypertension   . Complication of anesthesia     "hard to wake me up after colonoscopy" (03/13/12)  . H/O hiatal hernia   . GERD (gastroesophageal reflux disease)   . Unspecified paranoid state   . Atrial fibrillation with rapid ventricular response 03/13/12  . Osteoarthrosis, unspecified whether generalized or localized, lower leg   . Degeneration of lumbar or lumbosacral intervertebral disc   . Unspecified cerebral artery occlusion with cerebral infarction 10/2008    "mini stroke" denies residual (03/13/12)  . History of IBS     Past Surgical History  Procedure Laterality Date  . Pilonidal cyst / sinus excision  1957  . Dilation and curettage of uterus  1950's?  . Shoulder arthroscopy  ~ 2000     right    Family History  Problem Relation Age of Onset  . Diabetes Mother   . CVA Mother   . CVA Father     Social History:  reports that she quit smoking about 11 years ago. Her smoking use included Cigarettes. She has a 40 pack-year smoking history. She has never used smokeless tobacco. She reports that she does not drink alcohol or use illicit drugs.  Allergies:  Allergies  Allergen Reactions  . Metoclopramide Hcl Other (See Comments)     Tremors  . Penicillins Itching  . Aspirin Other (See Comments)    "gave me stomach aches; made me bleed" (03/13/12)  . Zetia [Ezetimibe] Other (See Comments)    Stomach and back pain    Medications: I have reviewed the patient's current medications.  Results for orders placed or performed during the hospital encounter of 03/31/14 (from the past 48 hour(s))  CBC     Status: Abnormal   Collection Time: 03/31/14  7:59 PM  Result Value Ref Range   WBC 6.1 4.0 - 10.5 K/uL   RBC 3.96 3.87 - 5.11 MIL/uL   Hemoglobin 11.8 (L) 12.0 - 15.0 g/dL   HCT 35.9 (L) 36.0 - 46.0 %   MCV 90.7 78.0 - 100.0 fL   MCH 29.8 26.0 - 34.0 pg   MCHC 32.9 30.0 - 36.0 g/dL   RDW 16.1 (H) 11.5 - 15.5 %   Platelets 259 150 - 400 K/uL  Basic metabolic panel  Status: Abnormal   Collection Time: 03/31/14  7:59 PM  Result Value Ref Range   Sodium 142 137 - 147 mEq/L   Potassium 4.6 3.7 - 5.3 mEq/L   Chloride 105 96 - 112 mEq/L   CO2 24 19 - 32 mEq/L   Glucose, Bld 109 (H) 70 - 99 mg/dL   BUN 16 6 - 23 mg/dL   Creatinine, Ser 0.69 0.50 - 1.10 mg/dL   Calcium 9.4 8.4 - 10.5 mg/dL   GFR calc non Af Amer 78 (L) >90 mL/min   GFR calc Af Amer >90 >90 mL/min    Comment: (NOTE) The eGFR has been calculated using the CKD EPI equation. This calculation has not been validated in all clinical situations. eGFR's persistently <90 mL/min signify possible Chronic Kidney Disease.    Anion gap 13 5 - 15  Vitamin B12     Status: None   Collection Time: 04/01/14  4:00  AM  Result Value Ref Range   Vitamin B-12 640 211 - 911 pg/mL    Comment: Performed at [facility]     Status: Abnormal   Collection Time: 04/01/14  4:00 AM  Result Value Ref Range   Prothrombin Time 27.1 (H) 11.6 - 15.2 seconds   INR 2.48 (H) 0.00 - 0.76  Basic metabolic panel     Status: Abnormal   Collection Time: 04/01/14  4:00 AM  Result Value Ref Range   Sodium 140 137 - 147 mEq/L   Potassium 4.6 3.7 - 5.3 mEq/L   Chloride 103 96 - 112 mEq/L   CO2 24 19 - 32 mEq/L   Glucose, Bld 113 (H) 70 - 99 mg/dL   BUN 16 6 - 23 mg/dL   Creatinine, Ser 0.75 0.50 - 1.10 mg/dL   Calcium 9.6 8.4 - 10.5 mg/dL   GFR calc non Af Amer 76 (L) >90 mL/min   GFR calc Af Amer 88 (L) >90 mL/min    Comment: (NOTE) The eGFR has been calculated using the CKD EPI equation. This calculation has not been validated in all clinical situations. eGFR's persistently <90 mL/min signify possible Chronic Kidney Disease.    Anion gap 13 5 - 15  CBC     Status: Abnormal   Collection Time: 04/01/14  4:00 AM  Result Value Ref Range   WBC 8.1 4.0 - 10.5 K/uL   RBC 3.73 (L) 3.87 - 5.11 MIL/uL   Hemoglobin 10.7 (L) 12.0 - 15.0 g/dL   HCT 33.4 (L) 36.0 - 46.0 %   MCV 89.5 78.0 - 100.0 fL   MCH 28.7 26.0 - 34.0 pg   MCHC 32.0 30.0 - 36.0 g/dL   RDW 16.4 (H) 11.5 - 15.5 %   Platelets 237 150 - 400 K/uL  Heparin level (unfractionated)     Status: Abnormal   Collection Time: 04/01/14  4:00 AM  Result Value Ref Range   Heparin Unfractionated >2.20 (H) 0.30 - 0.70 IU/mL    Comment: RESULTS CONFIRMED BY MANUAL DILUTION        IF HEPARIN RESULTS ARE BELOW EXPECTED VALUES, AND PATIENT DOSAGE HAS BEEN CONFIRMED, SUGGEST FOLLOW UP TESTING OF ANTITHROMBIN III LEVELS.   APTT     Status: Abnormal   Collection Time: 04/01/14  4:00 AM  Result Value Ref Range   aPTT 42 (H) 24 - 37 seconds    Comment:        IF BASELINE aPTT IS ELEVATED, SUGGEST PATIENT RISK ASSESSMENT BE USED TO  DETERMINE  APPROPRIATE ANTICOAGULANT THERAPY.   Glucose, capillary     Status: None   Collection Time: 04/01/14  9:33 AM  Result Value Ref Range   Glucose-Capillary 91 70 - 99 mg/dL   Comment 1 Notify RN     Dg Shoulder Right  03/31/2014   CLINICAL DATA:  Status post fall at home today, with acute onset of right shoulder pain. Initial encounter.  EXAM: RIGHT SHOULDER - 1 VIEW  COMPARISON:  None.  FINDINGS: Only a single view could be obtained due to limitations in patient positioning.  There is no definite evidence of fracture or dislocation.  Degenerative change is noted at the right glenohumeral joint, with joint space narrowing and sclerotic change. There is mild superior subluxation of the right humeral head. The right acromioclavicular joint is grossly unremarkable in appearance.  No significant soft tissue abnormalities are seen. The visualized portions of the lungs are clear.  IMPRESSION: No definite evidence of fracture or dislocation at the right glenohumeral joint. Degenerative change noted at the right glenohumeral joint.   Electronically Signed   By: Garald Balding M.D.   On: 03/31/2014 21:26   Ct Head Wo Contrast  03/31/2014   CLINICAL DATA:  Acute onset of dizziness. Fell forward while sitting on edge of bed. Headache. No loss of consciousness. Initial encounter.  EXAM: CT HEAD WITHOUT CONTRAST  CT CERVICAL SPINE WITHOUT CONTRAST  TECHNIQUE: Multidetector CT imaging of the head and cervical spine was performed following the standard protocol without intravenous contrast. Multiplanar CT image reconstructions of the cervical spine were also generated.  COMPARISON:  None.  FINDINGS: CT HEAD FINDINGS  There is no evidence of acute infarction, mass lesion, or intra- or extra-axial hemorrhage on CT.  Prominence of the ventricles and sulci reflects moderate cortical volume loss. Mild periventricular and subcortical white matter change likely reflects small vessel ischemic microangiopathy. Mild cerebellar  atrophy is noted. A small chronic lacunar infarct is noted at the left cerebellar hemisphere.  The brainstem and fourth ventricle are within normal limits. The basal ganglia are unremarkable in appearance. The cerebral hemispheres demonstrate grossly normal gray-white differentiation. No mass effect or midline shift is seen.  There is no evidence of fracture; visualized osseous structures are unremarkable in appearance. The orbits are within normal limits. The paranasal sinuses and mastoid air cells are well-aerated. Cerumen is noted filling the right external auditory canal.  CT CERVICAL SPINE FINDINGS  There is no evidence of acute fracture or subluxation. There is grade 1 anterolisthesis of C4 on C5, and grade 1 anterolisthesis of C7 on T1. Multilevel disc space narrowing is noted along the cervical and upper thoracic spine, with scattered anterior and posterior disc osteophyte complexes. Underlying facet disease is noted. Prevertebral soft tissues are within normal limits.  The thyroid gland is unremarkable in appearance. The visualized lung apices are clear. Diffuse calcification is seen along the aortic arch and great vessels. Retropharyngeal common carotid arteries are noted. Calcification is seen at the carotid bifurcations bilaterally.  IMPRESSION: 1. No evidence of traumatic intracranial injury or fracture. 2. No evidence of acute fracture or subluxation along the cervical spine. 3. Moderate cortical volume loss and scattered small vessel ischemic microangiopathy. Small chronic lacunar infarct at the left cerebellar hemisphere. 4. Degenerative change noted along the cervical and upper thoracic spine. 5. Diffuse calcification along the aortic arch and great vessels. Calcification noted at the carotid bifurcations bilaterally. Carotid ultrasound would be helpful for further evaluation, when and as deemed clinically  appropriate. 6. Cerumen noted filling the right external auditory canal.   Electronically  Signed   By: Garald Balding M.D.   On: 03/31/2014 22:37   Ct Cervical Spine Wo Contrast  03/31/2014   CLINICAL DATA:  Acute onset of dizziness. Fell forward while sitting on edge of bed. Headache. No loss of consciousness. Initial encounter.  EXAM: CT HEAD WITHOUT CONTRAST  CT CERVICAL SPINE WITHOUT CONTRAST  TECHNIQUE: Multidetector CT imaging of the head and cervical spine was performed following the standard protocol without intravenous contrast. Multiplanar CT image reconstructions of the cervical spine were also generated.  COMPARISON:  None.  FINDINGS: CT HEAD FINDINGS  There is no evidence of acute infarction, mass lesion, or intra- or extra-axial hemorrhage on CT.  Prominence of the ventricles and sulci reflects moderate cortical volume loss. Mild periventricular and subcortical white matter change likely reflects small vessel ischemic microangiopathy. Mild cerebellar atrophy is noted. A small chronic lacunar infarct is noted at the left cerebellar hemisphere.  The brainstem and fourth ventricle are within normal limits. The basal ganglia are unremarkable in appearance. The cerebral hemispheres demonstrate grossly normal gray-white differentiation. No mass effect or midline shift is seen.  There is no evidence of fracture; visualized osseous structures are unremarkable in appearance. The orbits are within normal limits. The paranasal sinuses and mastoid air cells are well-aerated. Cerumen is noted filling the right external auditory canal.  CT CERVICAL SPINE FINDINGS  There is no evidence of acute fracture or subluxation. There is grade 1 anterolisthesis of C4 on C5, and grade 1 anterolisthesis of C7 on T1. Multilevel disc space narrowing is noted along the cervical and upper thoracic spine, with scattered anterior and posterior disc osteophyte complexes. Underlying facet disease is noted. Prevertebral soft tissues are within normal limits.  The thyroid gland is unremarkable in appearance. The visualized  lung apices are clear. Diffuse calcification is seen along the aortic arch and great vessels. Retropharyngeal common carotid arteries are noted. Calcification is seen at the carotid bifurcations bilaterally.  IMPRESSION: 1. No evidence of traumatic intracranial injury or fracture. 2. No evidence of acute fracture or subluxation along the cervical spine. 3. Moderate cortical volume loss and scattered small vessel ischemic microangiopathy. Small chronic lacunar infarct at the left cerebellar hemisphere. 4. Degenerative change noted along the cervical and upper thoracic spine. 5. Diffuse calcification along the aortic arch and great vessels. Calcification noted at the carotid bifurcations bilaterally. Carotid ultrasound would be helpful for further evaluation, when and as deemed clinically appropriate. 6. Cerumen noted filling the right external auditory canal.   Electronically Signed   By: Garald Balding M.D.   On: 03/31/2014 22:37   Ct Humerus Right Wo Contrast  04/01/2014   CLINICAL DATA:  Distal humeral fracture  EXAM: CT OF THE RIGHT HUMERUS WITHOUT CONTRAST  TECHNIQUE: Multidetector CT imaging was performed according to the standard protocol. Multiplanar CT image reconstructions were also generated.  COMPARISON:  03/31/2014  FINDINGS: Severe glenohumeral degenerative arthropathy with marked loss of articular space and bone-on-bone appearance, with prominent spurring. Glenohumeral joint effusion observed. Oblique fracture of the distal humeral shaft with 22 degrees of apex posterior angulation, 1.6 cm of overlap, 6 mm of lateral displacement of the distal fragment, and 6 mm anterior displacement of the distal fragment.  IMPRESSION: 1. Oblique fracture of the distal humerus with mild overlap, mild displacement, and 22 degrees of apex posterior angulation. 2. Severe degenerative glenohumeral arthropathy with glenohumeral joint effusion.   Electronically Signed  By: Sherryl Barters M.D.   On: 04/01/2014 10:35    Ct 3d Recon At Scanner  04/01/2014   CLINICAL DATA:  Nonspecific (abnormal) findings on radiological and other examination of musculoskeletal sysem. Distal humeral fracture.  EXAM: 3-DIMENSIONAL CT IMAGE RENDERING ON ACQUISITION WORKSTATION  TECHNIQUE: 3-dimensional CT images were rendered by post-processing of the original CT data on an acquisition workstation. The 3-dimensional CT images were interpreted and findings were reported in the accompanying complete CT report for this study  COMPARISON:  04/01/2014 ; 03/31/2014  FINDINGS: Please see accession [id number] for description of findings.  IMPRESSION: Please see accession [id number] for description of impressions.   Electronically Signed   By: Sherryl Barters M.D.   On: 04/01/2014 10:48   Dg Humerus Right  03/31/2014   CLINICAL DATA:  Fall with right arm pain.  Initial encounter.  EXAM: RIGHT HUMERUS - 2+ VIEW  COMPARISON:  None.  FINDINGS: A spiral type fracture of the distal humerus is noted with 1.2 cm lateral and anterior displacement.  There is no evidence of subluxation or dislocation.  Severe degenerative changes at the glenohumeral joint noted.  IMPRESSION: Distal humeral spiral fracture with 1.2 cm lateral and anterior displacement.   Electronically Signed   By: Hassan Rowan M.D.   On: 03/31/2014 21:26    Review of Systems  Respiratory: Negative.   Gastrointestinal: Negative.   Genitourinary: Negative.   Neurological: Negative.    Blood pressure 142/76, pulse 86, temperature 98.7 F (37.1 C), temperature source Oral, resp. rate 16, height 5' 1.81" (1.57 m), weight 65.7 kg (144 lb 13.5 oz), SpO2 96 %. Physical Exam Pleasant female who is with her daughter in the room.  Right arm is in a splint. Her Ace wrap is a bit tightness have loosened her wrap. She tolerated this. She is sensate has refill. There is no signs of infection or compartment syndrome at this juncture. She is stable.  Her neck is nontender chest has equal expansion and  her abdomen is soft.  Left upper extremity has bruising but no obvious deformity.  Pelvis is stable.  Lower extremity examination shows a laceration over the left knee with sutures in place. There is no bandage on the knee. This is to air dry present time.  She has the ability to move her toes legs and knees without difficulty. Assessment/Plan: Displaced right distal third spiral humerus fracture. Recommendations are to proceed with surgical care  I discussed with patient and her family risk of bleeding infection anesthesia damage to normal structures and failure of surgery to accomplish its intended goals of relieving symptoms and restoring function. With this in mind a desire to proceed.  Patient and I discussed all issues as they are germane her predicament.  We will plan to proceed with surgical reconstruction Thursday. We will allow her coagulation parameters to normalize. I will discuss with medicine.  Postoperatively she will need extended care facility placement for a brief period of time until she can demonstrate ambulation independently with nonweightbearing status on her right arm for 3 months  Patient her family understand this and the plans  We will plan for surgery Thursday afternoon  We are planning surgery for your upper extremity. The risk and benefits of surgery include risk of bleeding infection anesthesia damage to normal structures and failure of the surgery to accomplish its intended goals of relieving symptoms and restoring function with this in mind we'll going to proceed. I have specifically discussed with the patient the  pre-and postoperative regime and the does and don'ts and risk and benefits in great detail. Risk and benefits of surgery also include risk of dystrophy chronic nerve pain failure of the healing process to go onto completion and other inherent risks of surgery The relavent the pathophysiology of the disease/injury process, as well as the alternatives  for treatment and postoperative course of action has been discussed in great detail with the patient who desires to proceed.  We will do everything in our power to help you (the patient) restore function to the upper extremity. Is a pleasure to see this patient today.      Paulene Floor 04/01/2014, 4:50 PM

## 2014-04-01 NOTE — Progress Notes (Signed)
ANTICOAGULATION CONSULT NOTE - Initial Consult  Pharmacy Consult for Heparin Indication: atrial fibrillation  Allergies  Allergen Reactions  . Metoclopramide Hcl Other (See Comments)     Tremors  . Penicillins Itching  . Aspirin Other (See Comments)    "gave me stomach aches; made me bleed" (03/13/12)  . Zetia [Ezetimibe] Other (See Comments)    Stomach and back pain    Patient Measurements: Height: 5' 1.81" (157 cm) Weight: 144 lb 13.5 oz (65.7 kg) IBW/kg (Calculated) : 49.67 Heparin Dosing Weight:   Vital Signs: Temp: 98.7 F (37.1 C) (12/01 1300) Temp Source: Oral (12/01 0549) BP: 142/76 mmHg (12/01 1300) Pulse Rate: 86 (12/01 1300)  Labs:  Recent Labs  03/31/14 1959 04/01/14 0400  HGB 11.8* 10.7*  HCT 35.9* 33.4*  PLT 259 237  APTT  --  42*  LABPROT  --  27.1*  INR  --  2.48*  HEPARINUNFRC  --  >2.20*  CREATININE 0.69 0.75    Estimated Creatinine Clearance: 47.2 mL/min (by C-G formula based on Cr of 0.75).   Medical History: Past Medical History  Diagnosis Date  . Allergy, unspecified not elsewhere classified   . Other dyspnea and respiratory abnormality   . Other diseases of lung, not elsewhere classified   . Anemia, unspecified   . Unspecified adverse effect of other drug, medicinal and biological substance(995.29)   . Blood in stool   . Hemorrhage of gastrointestinal tract, unspecified   . Personal history of peptic ulcer disease   . Anxiety state, unspecified   . Personal history of other diseases of digestive system   . Unspecified essential hypertension   . Complication of anesthesia     "hard to wake me up after colonoscopy" (03/13/12)  . H/O hiatal hernia   . GERD (gastroesophageal reflux disease)   . Unspecified paranoid state   . Atrial fibrillation with rapid ventricular response 03/13/12  . Osteoarthrosis, unspecified whether generalized or localized, lower leg   . Degeneration of lumbar or lumbosacral intervertebral disc   .  Unspecified cerebral artery occlusion with cerebral infarction 10/2008    "mini stroke" denies residual (03/13/12)  . History of IBS     Medications:  Scheduled:  . ALPRAZolam  0.125 mg Oral Q3H while awake  . cholecalciferol  5,000 Units Oral Daily  . clobetasol cream   Topical BID  . diltiazem  180 mg Oral BID WC  . metoprolol succinate  50 mg Oral Daily  . multivitamin with minerals  1 tablet Oral Daily  . pantoprazole  40 mg Oral Daily  . sodium chloride  3 mL Intravenous Q12H    Assessment: 80yo female on Xarelto for AFib, admitted s/p fall with fractured R-arm requiring ORIF.  Heparin bridge to start while off Xarelto.  Baseline Heparin level and INR elevated, indicating pt has been compliant with her Xarelto; last dose per d/w pt was with dinner 11/30.  Heparin to start ~24hr later, when next Xarelto dose would have been due.  Hg 10.7 and pltc wnl.  No bleeding noted.  Goal of Therapy:  Heparin level 0.3-0.7 units/ml Monitor platelets by anticoagulation protocol: Yes   Plan:  Heparin 850 units/hr, to start at 1800 tonigh HL, PTT in 8h Daily HL, CBC Daily PTT until HL correlates, then d/c Watch for s/s of bleeding  Gracy Bruins, [facility]

## 2014-04-01 NOTE — Progress Notes (Signed)
OT Cancellation Note  Patient Details Name: Jacqueline [name] MRN: [id number] DOB: 07-10-1928   Cancelled Treatment:    Reason Eval/Treat Not Completed: Patient not medically ready.Pt remains on strict bedrest, RN aware and ortho note concurring. Will continue to follow.  [name] 04/01/2014, 12:52 PM

## 2014-04-02 LAB — BASIC METABOLIC PANEL
ANION GAP: 10 (ref 5–15)
BUN: 12 mg/dL (ref 6–23)
CO2: 27 mEq/L (ref 19–32)
Calcium: 9.4 mg/dL (ref 8.4–10.5)
Chloride: 103 mEq/L (ref 96–112)
Creatinine, Ser: 0.77 mg/dL (ref 0.50–1.10)
GFR, EST AFRICAN AMERICAN: 88 mL/min — AB (ref >90)
GFR, EST NON AFRICAN AMERICAN: 76 mL/min — AB (ref >90)
Glucose, Bld: 123 mg/dL — ABNORMAL HIGH (ref 70–99)
POTASSIUM: 4.7 meq/L (ref 3.7–5.3)
SODIUM: 140 meq/L (ref 137–147)

## 2014-04-02 LAB — APTT
APTT: 76 s — AB (ref 24–37)
APTT: 77 s — AB (ref 24–37)

## 2014-04-02 LAB — HEPARIN LEVEL (UNFRACTIONATED): HEPARIN UNFRACTIONATED: 0.98 [IU]/mL — AB (ref 0.30–0.70)

## 2014-04-02 LAB — CBC
HCT: 31 % — ABNORMAL LOW (ref 36.0–46.0)
Hemoglobin: 10 g/dL — ABNORMAL LOW (ref 12.0–15.0)
MCH: 28.7 pg (ref 26.0–34.0)
MCHC: 32.3 g/dL (ref 30.0–36.0)
MCV: 88.8 fL (ref 78.0–100.0)
Platelets: 225 10*3/uL (ref 150–400)
RBC: 3.49 MIL/uL — ABNORMAL LOW (ref 3.87–5.11)
RDW: 16.1 % — AB (ref 11.5–15.5)
WBC: 7.1 10*3/uL (ref 4.0–10.5)

## 2014-04-02 LAB — GLUCOSE, CAPILLARY: Glucose-Capillary: 98 mg/dL (ref 70–99)

## 2014-04-02 NOTE — Progress Notes (Addendum)
PT Cancellation Note  Patient Details Name: Vidalia Serpas MRN: [id number] DOB: [birth year]   Cancelled Treatment:    Reason Eval/Treat Not Completed: Other (comment).  Pt continues to be on bedrest.  PT called both internal medicine attending and ortho MD (awaiting response) to see if bedrest can be removed.  Pt will likely be NWB right arm and ortho has arranged for surgery on Thursday of this week.  PT will continue to follow acutely and mobilize when MDs feel is appropriate.   Thanks,    Barbarann Ehlers. Eilee Schader, PT, DPT [phone]   04/02/2014, 10:03 AM   Addendum: heard back from internal medicine MD who wants to defer to ortho MD recs.  Will await word from ortho MD. RBM

## 2014-04-02 NOTE — Progress Notes (Signed)
[facility] for Heparin Indication: atrial fibrillation  Allergies  Allergen Reactions  . Metoclopramide Hcl Other (See Comments)     Tremors  . Penicillins Itching  . Aspirin Other (See Comments)    "gave me stomach aches; made me bleed" (03/13/12)  . Zetia [Ezetimibe] Other (See Comments)    Stomach and back pain    Patient Measurements: Height: 5' 1.81" (157 cm) Weight: 145 lb 1 oz (65.8 kg) IBW/kg (Calculated) : 49.67   Vital Signs: Temp: 97.6 F (36.4 C) (12/02 0550) BP: 98/53 mmHg (12/02 0550) Pulse Rate: 83 (12/02 0550)  Labs:  Recent Labs  03/31/14 1959 04/01/14 0400 04/02/14 0200 04/02/14 0201 04/02/14 1220  HGB 11.8* 10.7* 10.0*  --   --   HCT 35.9* 33.4* 31.0*  --   --   PLT 259 237 225  --   --   APTT  --  42* 76*  --  77*  LABPROT  --  27.1*  --   --   --   INR  --  2.48*  --   --   --   HEPARINUNFRC  --  >2.20* 0.98*  --   --   CREATININE 0.69 0.75  --  0.77  --     Estimated Creatinine Clearance: 47.2 mL/min (by C-G formula based on Cr of 0.77).  Assessment: 80 yo female with right arm fracture, h/o Afib and Xarelto on hold, for heparin.  APTT remains therapeutic.  Goal of Therapy:  APTT 66-102 Heparin level 0.3-0.7 units/ml Monitor platelets by anticoagulation protocol: Yes   Plan:  Continue Heparin at current rate  F/u am labs   Sanaya Gwilliam Poteet 04/02/2014,12:51 PM

## 2014-04-02 NOTE — Progress Notes (Signed)
[place] for Heparin Indication: atrial fibrillation  Allergies  Allergen Reactions  . Metoclopramide Hcl Other (See Comments)     Tremors  . Penicillins Itching  . Aspirin Other (See Comments)    "gave me stomach aches; made me bleed" (03/13/12)  . Zetia [Ezetimibe] Other (See Comments)    Stomach and back pain    Patient Measurements: Height: 5' 1.81" (157 cm) Weight: 145 lb 1 oz (65.8 kg) IBW/kg (Calculated) : 49.67 Heparin Dosing Weight:   Vital Signs: Temp: 97.6 F (36.4 C) (12/02 0550) BP: 98/53 mmHg (12/02 0550) Pulse Rate: 83 (12/02 0550)  Labs:  Recent Labs  03/31/14 1959 04/01/14 0400 04/02/14 0200 04/02/14 0201  HGB 11.8* 10.7* 10.0*  --   HCT 35.9* 33.4* 31.0*  --   PLT 259 237 225  --   APTT  --  42* 76*  --   LABPROT  --  27.1*  --   --   INR  --  2.48*  --   --   HEPARINUNFRC  --  >2.20*  --   --   CREATININE 0.69 0.75  --  0.77    Estimated Creatinine Clearance: 47.2 mL/min (by C-G formula based on Cr of 0.77).  Assessment: 80 yo female with right arm fracture, h/o Afib and Xarelto on hold, for heparin.    Goal of Therapy:  APTT 66-102 Heparin level 0.3-0.7 units/ml Monitor platelets by anticoagulation protocol: Yes   Plan:  Continue Heparin at current rate  Recheck aPTT in 6 hrs to verify   Damarco Keysor, [place] 04/02/2014,6:52 AM

## 2014-04-02 NOTE — Evaluation (Signed)
Physical Therapy Evaluation Patient Details Name: Jacqueline [name] MRN: [id number] DOB: 19-Jan-1929 Today's Date: 04/02/2014   History of Present Illness  80 y.o. female admitted to [facility] after fall at home with R displaced humerus fx.  Ortho consulted and surgery scheduled for 04/03/14.  Pt with significant PMHx of dyspnea, anxiety, HTN,A-fib with RVR, OA, degernation of lumbar discs, CVA, and R shoulder arthroscopy.     Clinical Impression  Pt was able to mobilize OOB to chair with mod one person assist today.  She has a tendency towards posterior lean and seems a bit confused at times.  She lives alone and has a supportive family, but I do not believe anyone who can provide 24/7 care.  Pt would benefit from SNF placement for rehab at discharge.      Follow Up Recommendations SNF    Equipment Recommendations  Wheelchair (measurements PT);Wheelchair cushion (measurements PT)    Recommendations for Other Services   NA    Precautions / Restrictions Precautions Precautions: Fall Precaution Comments: pt with h/o fall, but this is first fx due to fall Required Braces or Orthoses: Sling (for comfort during transfers)      Mobility  Bed Mobility Overal bed mobility: Needs Assistance Bed Mobility: Supine to Sit     Supine to sit: Mod assist;HOB elevated     General bed mobility comments: Mod assist to support trunk to come to sitting.  Pt was able to walk bil legs over the side of the bed, but had a difficult time getting to sitting due to right arm immobilized. HOB maximally elevated.   Transfers Overall transfer level: Needs assistance Equipment used: None Transfers: Sit to/from [facility] Sit to Stand: Mod assist Stand pivot transfers: Mod assist       General transfer comment: Mod assist to support trunk and hips with bed pad.  Pt using left hand on arm rest of the chair for stability.  Had to stand twice before successful transfer achieved.  Verbal cues for hand  placement on far left armrest of recliner chair as we stood together, too multiple pivotal steps to the left.           Balance Overall balance assessment: Needs assistance Sitting-balance support: Feet supported;Single extremity supported Sitting balance-Leahy Scale: Fair   Postural control: Posterior lean Standing balance support: Single extremity supported Standing balance-Leahy Scale: Poor Standing balance comment: Pt relied on left upper extremity, support of the therapist, and support of the bed on her legs.                              Pertinent Vitals/Pain Pain Assessment: 0-10 Pain Score: 7  Pain Location: right arm Pain Descriptors / Indicators: Aching;Burning Pain Intervention(s): Monitored during session;Limited activity within patient's tolerance;Repositioned    Home Living Family/patient expects to be discharged to:: Private residence Living Arrangements: Alone Available Help at Discharge: Family;Available PRN/intermittently Type of Home: House (town home) Home Access: Level entry     Home Layout: One level Home Equipment: Environmental consultant - 4 wheels;Bedside commode;Shower seat      Prior Function Level of Independence: Independent with assistive device(s);Needs assistance   Gait / Transfers Assistance Needed: per pt she uses the rollator for community ambulation  ADL's / Homemaking Assistance Needed: son does all of the housework  Comments: Daughter drives to get her groceries, she heats up food does not prepare.         Extremity/Trunk Assessment  Upper Extremity Assessment: Defer to OT evaluation           Lower Extremity Assessment: RLE deficits/detail;LLE deficits/detail RLE Deficits / Details: bil legs with generalized weakness per functional mobility.  At least 3/5 per gross functional assessment.  LLE Deficits / Details: left knee ace wrapped with laceration that had to be sutured.  Limited mildly by pain here.  Gross functional  assessment at least 3/5 strength in this leg as well.   Cervical / Trunk Assessment: Kyphotic  Communication   Communication: No difficulties  Cognition Arousal/Alertness: Awake/alert Behavior During Therapy: WFL for tasks assessed/performed Overall Cognitive Status: No family/caregiver present to determine baseline cognitive functioning                               Assessment/Plan    PT Assessment Patient needs continued PT services  PT Diagnosis Difficulty walking;Abnormality of gait;Generalized weakness;Acute pain   PT Problem List Decreased strength;Decreased range of motion;Decreased activity tolerance;Decreased balance;Decreased mobility;Decreased knowledge of use of DME;Pain  PT Treatment Interventions DME instruction;Gait training;Functional mobility training;Therapeutic activities;Therapeutic exercise;Balance training;Neuromuscular re-education;Cognitive remediation;Stair training;Patient/family education;Modalities   PT Goals (Current goals can be found in the Care Plan section) Acute Rehab PT Goals Patient Stated Goal: to decrease right arm pain PT Goal Formulation: With patient Time For Goal Achievement: 04/09/14 Potential to Achieve Goals: Fair    Frequency Min 5X/week   Barriers to discharge Decreased caregiver support does not seem that pt has 24/7 assist at home       End of Session Equipment Utilized During Treatment: Other (comment) (right arm sling) Activity Tolerance: Patient limited by pain Patient left: in chair;with call bell/phone within reach Nurse Communication: Mobility status         Time: 7622-6333 PT Time Calculation (min) (ACUTE ONLY): 27 min   Charges:   PT Evaluation $Initial PT Evaluation Tier I: 1 Procedure PT Treatments $Therapeutic Activity: 8-22 mins        Harol Shabazz B. Mariabelen Pressly, PT, DPT [phone]   04/02/2014, 12:31 PM

## 2014-04-02 NOTE — Evaluation (Signed)
Occupational Therapy Evaluation Patient Details Name: Jacqueline [name] MRN: [id number] DOB: January 12, 1929 Today's Date: 04/02/2014    History of Present Illness 80 y.o. female admitted to [facility] after fall at home with R displaced humerus fx.  Ortho consulted and surgery scheduled for 04/03/14.  Pt with significant PMHx of dyspnea, anxiety, HTN,A-fib with RVR, OA, degernation of lumbar discs, CVA, and R shoulder arthroscopy.      Clinical Impression   Pt was ambulating with a walker and perform self care and simple meal prep at a modified independent level prior to admission.  She was fairly sedentary per her report.  Pt's family assisted with IADL. Pt presents with some confusion and impaired memory which may be baseline.  She requires assist for all ADL and +1 assist for transfers.  Pt did not ambulate. Instructed pt in edema management of R hand and encouraged OOB to [facility] and chair. Pt to have surgery to repair her humeral fx tomorrow. Will continue to follow. Pt will require SNF for rehab upon d/c.    Follow Up Recommendations  SNF;Supervision/Assistance - 24 hour    Equipment Recommendations  None recommended by OT    Recommendations for Other Services       Precautions / Restrictions Precautions Precautions: Fall Precaution Comments: pt with h/o fall, but this is first fx due to fall Required Braces or Orthoses: Sling (during transfers) Restrictions Weight Bearing Restrictions:  (instructed to avoid WB on L UE)      Mobility Bed Mobility Overal bed mobility: Needs Assistance Bed Mobility: Sit to Supine     Supine to sit: Mod assist;HOB elevated Sit to supine: Mod assist;HOB elevated   General bed mobility comments: assist for B LEs and verbal cues to avoid R UE use with sit to supine, +2 assist to pull up in bed  Transfers Overall transfer level: Needs assistance Equipment used: None Transfers: Stand Pivot Transfers Sit to Stand: Mod assist Stand pivot transfers: Mod assist        General transfer comment: used momentum, verbal cues for maximal effort and hand placement, pt with posterior lean    Balance Overall balance assessment: Needs assistance Sitting-balance support: Feet supported;Single extremity supported Sitting balance-Leahy Scale: Fair   Postural control: Posterior lean Standing balance support: Single extremity supported Standing balance-Leahy Scale: Poor Standing balance comment: Pt relied on left upper extremity, support of the therapist, and support of the bed on her legs.                             ADL Overall ADL's : Needs assistance/impaired Eating/Feeding: Set up;Sitting   Grooming: Wash/dry face;Wash/dry hands;Minimal assistance;Sitting   Upper Body Bathing: Maximal assistance;Sitting   Lower Body Bathing: Sit to/from stand;Maximal assistance   Upper Body Dressing : Maximal assistance;Sitting   Lower Body Dressing: Sit to/from stand;Maximal assistance   Toilet Transfer: Moderate assistance;Stand-pivot;BSC   Toileting- Clothing Manipulation and Hygiene: Total assistance;Sit to/from stand         General ADL Comments: Instructed pt in use of incentive spirometer and issued squeeze ball for edema management.     Vision                     Perception     Praxis      Pertinent Vitals/Pain Pain Assessment: 0-10 Pain Score: 9  Pain Location: R UE Pain Descriptors / Indicators: Aching Pain Intervention(s): Monitored during session;Repositioned;Patient requesting pain meds-RN notified  Hand Dominance Right   Extremity/Trunk Assessment Upper Extremity Assessment Upper Extremity Assessment: RUE deficits/detail;LUE deficits/detail RUE Deficits / Details: moderate edema in hand, full AROM of finger, splinted from wrist to shoulder RUE: Unable to fully assess due to immobilization RUE Coordination: decreased fine motor;decreased gross motor LUE Deficits / Details: generalized weakness   Lower  Extremity Assessment Lower Extremity Assessment: Defer to PT evaluation RLE Deficits / Details: bil legs with generalized weakness per functional mobility.  At least 3/5 per gross functional assessment.  LLE Deficits / Details: left knee ace wrapped with laceration that had to be sutured.  Limited mildly by pain here.  Gross functional assessment at least 3/5 strength in this leg as well.    Cervical / Trunk Assessment Cervical / Trunk Assessment: Kyphotic   Communication Communication Communication: No difficulties   Cognition Arousal/Alertness: Awake/alert Behavior During Therapy: WFL for tasks assessed/performed Overall Cognitive Status: No family/caregiver present to determine baseline cognitive functioning       Memory: Decreased short-term memory             General Comments       Exercises Exercises:  (R hand AROM)     Shoulder Instructions      Home Living Family/patient expects to be discharged to:: Skilled nursing facility Living Arrangements: Alone Available Help at Discharge: Family;Available PRN/intermittently Type of Home: House (town home) Home Access: Level entry     Home Layout: One level               Home Equipment: Environmental consultant - 4 wheels;Bedside commode;Shower seat          Prior Functioning/Environment Level of Independence: Independent with assistive device(s);Needs assistance  Gait / Transfers Assistance Needed: per pt she uses the rollator for community ambulation ADL's / Homemaking Assistance Needed: son does all of the housework   Comments: Daughter drives to get her groceries, she heats up food does not prepare.     OT Diagnosis: Generalized weakness;Cognitive deficits;Acute pain   OT Problem List: Decreased strength;Decreased range of motion;Decreased activity tolerance;Impaired balance (sitting and/or standing);Decreased coordination;Decreased cognition;Decreased safety awareness;Decreased knowledge of use of DME or AE;Impaired UE  functional use;Pain;Increased edema   OT Treatment/Interventions: Self-care/ADL training;Therapeutic exercise;Therapeutic activities;Patient/family education;Balance training;DME and/or AE instruction    OT Goals(Current goals can be found in the care plan section) Acute Rehab OT Goals Patient Stated Goal: to decrease right arm pain OT Goal Formulation: With patient Time For Goal Achievement: 04/09/14 Potential to Achieve Goals: Good  OT Frequency: Min 2X/week   Barriers to D/C: Decreased caregiver support          Co-evaluation              End of Session Equipment Utilized During Treatment: Gait belt;Other (comment) (sling) Nurse Communication: Patient requests pain meds  Activity Tolerance: Patient tolerated treatment well Patient left: in bed;with call bell/phone within reach;with bed alarm set   Time: 1424-1455 OT Time Calculation (min): 31 min Charges:  OT General Charges $OT Visit: 1 Procedure OT Evaluation $Initial OT Evaluation Tier I: 1 Procedure OT Treatments $Self Care/Home Management : 8-22 mins G-Codes:    Jacqueline [name] 04/02/2014, 4:01 PM  [phone]

## 2014-04-02 NOTE — Progress Notes (Signed)
[facility] PROGRESS NOTE  Jacqueline [name] ZYY:[id number] DOB: 15-Jun-1928 DOA: 03/31/2014 PCP: [name], MD  Assessment/Plan: 80 y.o. female with past medical history of HTN, GERD, A fib, h/o CVAs, CHF who presents with right arm and left knee pain after fall.  1. Fall mechanical with R humeral fracture  -per ortho surgery on thursday; patient is on heparin IV due  (A fib +h/o CVA) -cont pain control;'   2. A fib on xarelto at home, BB; patient denies acute cardiopulmonary symptoms';  -echo (2014): LVEF: 55% to 60%. Wall motion was normal -cont BB, cardizem; on IV heparin as above  3. HTN on BB, cardizem, lasix; BP is soft; diuretic on hold; close monitor  4. Anemia, no s/s of acute bleeding; close monitor  5. H/o Diastolic CHF, clinically euvolemic; lasix on hold periop   Code Status: full Family Communication: d/w patient, no family at the bedside (indicate person spoken with, relationship, and if by phone, the number) Disposition Plan: pend surgeyr    Consultants:  ortho  Procedures:  None   Antibiotics:  periop (indicate start date, and stop date if known)  HPI/Subjective: Alert, reports mild arm pain   Objective: Filed Vitals:   04/02/14 0550  BP: 98/53  Pulse: 83  Temp: 97.6 F (36.4 C)  Resp: 16    Intake/Output Summary (Last 24 hours) at 04/02/14 1240 Last data filed at 04/02/14 0900  Gross per 24 hour  Intake 1167.24 ml  Output      0 ml  Net 1167.24 ml   Filed Weights   04/01/14 0549 04/02/14 0550  Weight: 65.7 kg (144 lb 13.5 oz) 65.8 kg (145 lb 1 oz)    Exam:   General:  Alert, oriented   Cardiovascular: s1,s2 rrr  Respiratory: CTA BL  Abdomen: soft, nt,nd   Musculoskeletal: no pedal edema   Data Reviewed: Basic Metabolic Panel:  Recent Labs Lab 03/31/14 1959 04/01/14 0400 04/02/14 0201  NA 142 140 140  K 4.6 4.6 4.7  CL 105 103 103  CO2 24 24 27   GLUCOSE 109* 113* 123*  BUN 16 16 12   CREATININE 0.69 0.75 0.77   CALCIUM 9.4 9.6 9.4   Liver Function Tests: No results for input(s): AST, ALT, ALKPHOS, BILITOT, PROT, ALBUMIN in the last 168 hours. No results for input(s): LIPASE, AMYLASE in the last 168 hours. No results for input(s): AMMONIA in the last 168 hours. CBC:  Recent Labs Lab 03/31/14 1959 04/01/14 0400 04/02/14 0200  WBC 6.1 8.1 7.1  HGB 11.8* 10.7* 10.0*  HCT 35.9* 33.4* 31.0*  MCV 90.7 89.5 88.8  PLT 259 237 225   Cardiac Enzymes: No results for input(s): CKTOTAL, CKMB, CKMBINDEX, TROPONINI in the last 168 hours. BNP (last 3 results) No results for input(s): PROBNP in the last 8760 hours. CBG:  Recent Labs Lab 04/01/14 0933 04/02/14 0635  GLUCAP 91 98    No results found for this or any previous visit (from the past 240 hour(s)).   Studies: Dg Shoulder Right  03/31/2014   CLINICAL DATA:  Status post fall at home today, with acute onset of right shoulder pain. Initial encounter.  EXAM: RIGHT SHOULDER - 1 VIEW  COMPARISON:  None.  FINDINGS: Only a single view could be obtained due to limitations in patient positioning.  There is no definite evidence of fracture or dislocation.  Degenerative change is noted at the right glenohumeral joint, with joint space narrowing and sclerotic change. There is mild superior subluxation of  the right humeral head. The right acromioclavicular joint is grossly unremarkable in appearance.  No significant soft tissue abnormalities are seen. The visualized portions of the lungs are clear.  IMPRESSION: No definite evidence of fracture or dislocation at the right glenohumeral joint. Degenerative change noted at the right glenohumeral joint.   Electronically Signed   By: Garald Balding M.D.   On: 03/31/2014 21:26   Ct Head Wo Contrast  03/31/2014   CLINICAL DATA:  Acute onset of dizziness. Fell forward while sitting on edge of bed. Headache. No loss of consciousness. Initial encounter.  EXAM: CT HEAD WITHOUT CONTRAST  CT CERVICAL SPINE WITHOUT  CONTRAST  TECHNIQUE: Multidetector CT imaging of the head and cervical spine was performed following the standard protocol without intravenous contrast. Multiplanar CT image reconstructions of the cervical spine were also generated.  COMPARISON:  None.  FINDINGS: CT HEAD FINDINGS  There is no evidence of acute infarction, mass lesion, or intra- or extra-axial hemorrhage on CT.  Prominence of the ventricles and sulci reflects moderate cortical volume loss. Mild periventricular and subcortical white matter change likely reflects small vessel ischemic microangiopathy. Mild cerebellar atrophy is noted. A small chronic lacunar infarct is noted at the left cerebellar hemisphere.  The brainstem and fourth ventricle are within normal limits. The basal ganglia are unremarkable in appearance. The cerebral hemispheres demonstrate grossly normal gray-white differentiation. No mass effect or midline shift is seen.  There is no evidence of fracture; visualized osseous structures are unremarkable in appearance. The orbits are within normal limits. The paranasal sinuses and mastoid air cells are well-aerated. Cerumen is noted filling the right external auditory canal.  CT CERVICAL SPINE FINDINGS  There is no evidence of acute fracture or subluxation. There is grade 1 anterolisthesis of C4 on C5, and grade 1 anterolisthesis of C7 on T1. Multilevel disc space narrowing is noted along the cervical and upper thoracic spine, with scattered anterior and posterior disc osteophyte complexes. Underlying facet disease is noted. Prevertebral soft tissues are within normal limits.  The thyroid gland is unremarkable in appearance. The visualized lung apices are clear. Diffuse calcification is seen along the aortic arch and great vessels. Retropharyngeal common carotid arteries are noted. Calcification is seen at the carotid bifurcations bilaterally.  IMPRESSION: 1. No evidence of traumatic intracranial injury or fracture. 2. No evidence of  acute fracture or subluxation along the cervical spine. 3. Moderate cortical volume loss and scattered small vessel ischemic microangiopathy. Small chronic lacunar infarct at the left cerebellar hemisphere. 4. Degenerative change noted along the cervical and upper thoracic spine. 5. Diffuse calcification along the aortic arch and great vessels. Calcification noted at the carotid bifurcations bilaterally. Carotid ultrasound would be helpful for further evaluation, when and as deemed clinically appropriate. 6. Cerumen noted filling the right external auditory canal.   Electronically Signed   By: Garald Balding M.D.   On: 03/31/2014 22:37   Ct Cervical Spine Wo Contrast  03/31/2014   CLINICAL DATA:  Acute onset of dizziness. Fell forward while sitting on edge of bed. Headache. No loss of consciousness. Initial encounter.  EXAM: CT HEAD WITHOUT CONTRAST  CT CERVICAL SPINE WITHOUT CONTRAST  TECHNIQUE: Multidetector CT imaging of the head and cervical spine was performed following the standard protocol without intravenous contrast. Multiplanar CT image reconstructions of the cervical spine were also generated.  COMPARISON:  None.  FINDINGS: CT HEAD FINDINGS  There is no evidence of acute infarction, mass lesion, or intra- or extra-axial hemorrhage on CT.  Prominence of the ventricles and sulci reflects moderate cortical volume loss. Mild periventricular and subcortical white matter change likely reflects small vessel ischemic microangiopathy. Mild cerebellar atrophy is noted. A small chronic lacunar infarct is noted at the left cerebellar hemisphere.  The brainstem and fourth ventricle are within normal limits. The basal ganglia are unremarkable in appearance. The cerebral hemispheres demonstrate grossly normal gray-white differentiation. No mass effect or midline shift is seen.  There is no evidence of fracture; visualized osseous structures are unremarkable in appearance. The orbits are within normal limits. The  paranasal sinuses and mastoid air cells are well-aerated. Cerumen is noted filling the right external auditory canal.  CT CERVICAL SPINE FINDINGS  There is no evidence of acute fracture or subluxation. There is grade 1 anterolisthesis of C4 on C5, and grade 1 anterolisthesis of C7 on T1. Multilevel disc space narrowing is noted along the cervical and upper thoracic spine, with scattered anterior and posterior disc osteophyte complexes. Underlying facet disease is noted. Prevertebral soft tissues are within normal limits.  The thyroid gland is unremarkable in appearance. The visualized lung apices are clear. Diffuse calcification is seen along the aortic arch and great vessels. Retropharyngeal common carotid arteries are noted. Calcification is seen at the carotid bifurcations bilaterally.  IMPRESSION: 1. No evidence of traumatic intracranial injury or fracture. 2. No evidence of acute fracture or subluxation along the cervical spine. 3. Moderate cortical volume loss and scattered small vessel ischemic microangiopathy. Small chronic lacunar infarct at the left cerebellar hemisphere. 4. Degenerative change noted along the cervical and upper thoracic spine. 5. Diffuse calcification along the aortic arch and great vessels. Calcification noted at the carotid bifurcations bilaterally. Carotid ultrasound would be helpful for further evaluation, when and as deemed clinically appropriate. 6. Cerumen noted filling the right external auditory canal.   Electronically Signed   By: Garald Balding M.D.   On: 03/31/2014 22:37   Ct Humerus Right Wo Contrast  04/01/2014   CLINICAL DATA:  Distal humeral fracture  EXAM: CT OF THE RIGHT HUMERUS WITHOUT CONTRAST  TECHNIQUE: Multidetector CT imaging was performed according to the standard protocol. Multiplanar CT image reconstructions were also generated.  COMPARISON:  03/31/2014  FINDINGS: Severe glenohumeral degenerative arthropathy with marked loss of articular space and  bone-on-bone appearance, with prominent spurring. Glenohumeral joint effusion observed. Oblique fracture of the distal humeral shaft with 22 degrees of apex posterior angulation, 1.6 cm of overlap, 6 mm of lateral displacement of the distal fragment, and 6 mm anterior displacement of the distal fragment.  IMPRESSION: 1. Oblique fracture of the distal humerus with mild overlap, mild displacement, and 22 degrees of apex posterior angulation. 2. Severe degenerative glenohumeral arthropathy with glenohumeral joint effusion.   Electronically Signed   By: Sherryl Barters M.D.   On: 04/01/2014 10:35   Ct 3d Recon At Scanner  04/01/2014   CLINICAL DATA:  Nonspecific (abnormal) findings on radiological and other examination of musculoskeletal sysem. Distal humeral fracture.  EXAM: 3-DIMENSIONAL CT IMAGE RENDERING ON ACQUISITION WORKSTATION  TECHNIQUE: 3-dimensional CT images were rendered by post-processing of the original CT data on an acquisition workstation. The 3-dimensional CT images were interpreted and findings were reported in the accompanying complete CT report for this study  COMPARISON:  04/01/2014 ; 03/31/2014  FINDINGS: Please see accession [id number] for description of findings.  IMPRESSION: Please see accession [id number] for description of impressions.   Electronically Signed   By: Sherryl Barters M.D.   On: 04/01/2014 10:48   Dg  Knee Complete 4 Views Left  04/01/2014   CLINICAL DATA:  Fall, patellar laceration, acute pain  EXAM: LEFT KNEE - COMPLETE 4+ VIEW  COMPARISON:  None.  FINDINGS: Bones are osteopenic. Normal alignment without fracture or significant effusion. Relatively preserved joint spaces. Peripheral calcific atherosclerosis present.  IMPRESSION: No acute finding.   Electronically Signed   By: Daryll Brod M.D.   On: 04/01/2014 20:47   Dg Humerus Right  03/31/2014   CLINICAL DATA:  Fall with right arm pain.  Initial encounter.  EXAM: RIGHT HUMERUS - 2+ VIEW  COMPARISON:  None.  FINDINGS:  A spiral type fracture of the distal humerus is noted with 1.2 cm lateral and anterior displacement.  There is no evidence of subluxation or dislocation.  Severe degenerative changes at the glenohumeral joint noted.  IMPRESSION: Distal humeral spiral fracture with 1.2 cm lateral and anterior displacement.   Electronically Signed   By: Hassan Rowan M.D.   On: 03/31/2014 21:26    Scheduled Meds: . ALPRAZolam  0.125 mg Oral Q3H while awake  .  ceFAZolin (ANCEF) IV  1 g Intravenous Q8H  . cholecalciferol  5,000 Units Oral Daily  . clobetasol cream   Topical BID  . diltiazem  180 mg Oral BID WC  . metoprolol succinate  50 mg Oral Daily  . multivitamin with minerals  1 tablet Oral Daily  . pantoprazole  40 mg Oral Daily  . sodium chloride  3 mL Intravenous Q12H   Continuous Infusions: . heparin 850 Units/hr (04/01/14 1823)    Principal Problem:   Right arm fracture Active Problems:   Essential hypertension   CVA (cerebral infarction)   Osteoarthritis   History of peptic ulcer disease   PAF (paroxysmal atrial fibrillation)   Chronic diastolic CHF (congestive heart failure)    Time spent: >35 minutes     Kinnie Feil  [facility] Pager [phone]. If 7PM-7AM, please contact night-coverage at [email or web address], password [facility] 04/02/2014, 12:40 PM  LOS: 2 days

## 2014-04-03 ENCOUNTER — Inpatient Hospital Stay (HOSPITAL_COMMUNITY): Payer: Medicare Other | Admitting: Anesthesiology

## 2014-04-03 ENCOUNTER — Encounter (HOSPITAL_COMMUNITY)
Admission: EM | Disposition: A | Discharge status: Skilled Nursing Facility | Payer: Self-pay | Source: Home / Self Care | Attending: Family Medicine

## 2014-04-03 DIAGNOSIS — S42302A Unspecified fracture of shaft of humerus, left arm, initial encounter for closed fracture: Secondary | ICD-10-CM | POA: Diagnosis present

## 2014-04-03 HISTORY — PX: ORIF HUMERUS FRACTURE: SHX2126

## 2014-04-03 LAB — CBC
HCT: 30.5 % — ABNORMAL LOW (ref 36.0–46.0)
Hemoglobin: 9.8 g/dL — ABNORMAL LOW (ref 12.0–15.0)
MCH: 28.5 pg (ref 26.0–34.0)
MCHC: 32.1 g/dL (ref 30.0–36.0)
MCV: 88.7 fL (ref 78.0–100.0)
Platelets: 215 10*3/uL (ref 150–400)
RBC: 3.44 MIL/uL — AB (ref 3.87–5.11)
RDW: 16 % — ABNORMAL HIGH (ref 11.5–15.5)
WBC: 6.3 10*3/uL (ref 4.0–10.5)

## 2014-04-03 LAB — GLUCOSE, CAPILLARY: GLUCOSE-CAPILLARY: 111 mg/dL — AB (ref 70–99)

## 2014-04-03 LAB — APTT: aPTT: 41 seconds — ABNORMAL HIGH (ref 24–37)

## 2014-04-03 LAB — PROTIME-INR
INR: 1.05 (ref 0.00–1.49)
PROTHROMBIN TIME: 13.8 s (ref 11.6–15.2)

## 2014-04-03 LAB — HEPARIN LEVEL (UNFRACTIONATED): Heparin Unfractionated: 0.32 IU/mL (ref 0.30–0.70)

## 2014-04-03 LAB — SURGICAL PCR SCREEN
MRSA, PCR: NEGATIVE
Staphylococcus aureus: POSITIVE — AB

## 2014-04-03 SURGERY — OPEN REDUCTION INTERNAL FIXATION (ORIF) DISTAL HUMERUS FRACTURE
Anesthesia: Regional | Site: Arm Upper | Laterality: Right

## 2014-04-03 MED ORDER — METHOCARBAMOL 500 MG PO TABS
500.0000 mg | ORAL_TABLET | Freq: Four times a day (QID) | ORAL | Status: DC | PRN
  Administered 2014-04-05 – 2014-04-06 (×3): 500 mg via ORAL
  Filled 2014-04-03 (×3): qty 1

## 2014-04-03 MED ORDER — RIVAROXABAN 10 MG PO TABS
10.0000 mg | ORAL_TABLET | Freq: Every day | ORAL | Status: DC
  Filled 2014-04-03: qty 1

## 2014-04-03 MED ORDER — FENTANYL CITRATE 0.05 MG/ML IJ SOLN
INTRAMUSCULAR | Status: AC
  Filled 2014-04-03: qty 5

## 2014-04-03 MED ORDER — BUPIVACAINE HCL (PF) 0.25 % IJ SOLN
INTRAMUSCULAR | Status: AC
  Filled 2014-04-03: qty 30

## 2014-04-03 MED ORDER — ALPRAZOLAM 0.25 MG PO TABS
ORAL_TABLET | ORAL | Status: AC
  Administered 2014-04-03: 0.125 mg via ORAL
  Filled 2014-04-03: qty 1

## 2014-04-03 MED ORDER — SODIUM CHLORIDE 0.9 % IV SOLN
INTRAVENOUS | Status: DC
  Administered 2014-04-03: 11:00:00 via INTRAVENOUS

## 2014-04-03 MED ORDER — PROPOFOL 10 MG/ML IV BOLUS
INTRAVENOUS | Status: AC
  Filled 2014-04-03: qty 20

## 2014-04-03 MED ORDER — MIDAZOLAM HCL 2 MG/2ML IJ SOLN
INTRAMUSCULAR | Status: AC
  Administered 2014-04-03: 1 mg via INTRAVENOUS
  Filled 2014-04-03: qty 2

## 2014-04-03 MED ORDER — VANCOMYCIN HCL IN DEXTROSE 1-5 GM/200ML-% IV SOLN
1000.0000 mg | Freq: Once | INTRAVENOUS | Status: AC
  Administered 2014-04-04: 1000 mg via INTRAVENOUS
  Filled 2014-04-03: qty 200

## 2014-04-03 MED ORDER — CHLORHEXIDINE GLUCONATE 4 % EX LIQD
60.0000 mL | Freq: Once | CUTANEOUS | Status: DC
  Filled 2014-04-03: qty 60

## 2014-04-03 MED ORDER — ONDANSETRON HCL 4 MG PO TABS: 4.0000 mg | ORAL_TABLET | Freq: Four times a day (QID) | ORAL | Status: DC | PRN

## 2014-04-03 MED ORDER — LACTATED RINGERS IV SOLN
INTRAVENOUS | Status: DC | PRN
  Administered 2014-04-03 (×2): via INTRAVENOUS

## 2014-04-03 MED ORDER — ROCURONIUM BROMIDE 50 MG/5ML IV SOLN
INTRAVENOUS | Status: AC
  Filled 2014-04-03: qty 1

## 2014-04-03 MED ORDER — PROPOFOL 10 MG/ML IV BOLUS
INTRAVENOUS | Status: AC
Start: 2014-04-03 — End: 2014-04-03
  Filled 2014-04-03: qty 20

## 2014-04-03 MED ORDER — ONDANSETRON HCL 4 MG/2ML IJ SOLN: 4.0000 mg | Freq: Four times a day (QID) | INTRAMUSCULAR | Status: DC | PRN

## 2014-04-03 MED ORDER — ONDANSETRON HCL 4 MG/2ML IJ SOLN: 4.0000 mg | Freq: Once | INTRAMUSCULAR | Status: DC | PRN

## 2014-04-03 MED ORDER — SODIUM CHLORIDE 0.9 % IJ SOLN
INTRAMUSCULAR | Status: AC
  Filled 2014-04-03: qty 10

## 2014-04-03 MED ORDER — PROMETHAZINE HCL 25 MG RE SUPP: 12.5000 mg | Freq: Four times a day (QID) | RECTAL | Status: DC | PRN

## 2014-04-03 MED ORDER — METHOCARBAMOL 1000 MG/10ML IJ SOLN
500.0000 mg | Freq: Four times a day (QID) | INTRAVENOUS | Status: DC | PRN
  Filled 2014-04-03: qty 5

## 2014-04-03 MED ORDER — LIDOCAINE HCL (CARDIAC) 20 MG/ML IV SOLN
INTRAVENOUS | Status: DC | PRN
  Administered 2014-04-03: 60 mg via INTRAVENOUS

## 2014-04-03 MED ORDER — SUCCINYLCHOLINE CHLORIDE 20 MG/ML IJ SOLN
INTRAMUSCULAR | Status: AC
  Filled 2014-04-03: qty 1

## 2014-04-03 MED ORDER — FENTANYL CITRATE 0.05 MG/ML IJ SOLN
INTRAMUSCULAR | Status: AC
  Administered 2014-04-03: 50 ug via INTRAVENOUS
  Filled 2014-04-03: qty 2

## 2014-04-03 MED ORDER — VANCOMYCIN HCL IN DEXTROSE 1-5 GM/200ML-% IV SOLN
1000.0000 mg | INTRAVENOUS | Status: DC
  Filled 2014-04-03: qty 200

## 2014-04-03 MED ORDER — PHENYLEPHRINE 40 MCG/ML (10ML) SYRINGE FOR IV PUSH (FOR BLOOD PRESSURE SUPPORT)
PREFILLED_SYRINGE | INTRAVENOUS | Status: AC
  Filled 2014-04-03: qty 10

## 2014-04-03 MED ORDER — PHENYLEPHRINE HCL 10 MG/ML IJ SOLN
INTRAMUSCULAR | Status: DC | PRN
  Administered 2014-04-03: 40 ug via INTRAVENOUS

## 2014-04-03 MED ORDER — PROPOFOL INFUSION 10 MG/ML OPTIME
INTRAVENOUS | Status: DC | PRN
  Administered 2014-04-03: 75 ug/kg/min via INTRAVENOUS

## 2014-04-03 MED ORDER — OXYCODONE HCL 5 MG PO TABS: 5.0000 mg | ORAL_TABLET | Freq: Once | ORAL | Status: DC | PRN

## 2014-04-03 MED ORDER — HYDROMORPHONE HCL 1 MG/ML IJ SOLN
INTRAMUSCULAR | Status: AC
  Administered 2014-04-03: 0.5 mg via INTRAVENOUS
  Filled 2014-04-03: qty 1

## 2014-04-03 MED ORDER — BUPIVACAINE-EPINEPHRINE (PF) 0.5% -1:200000 IJ SOLN
INTRAMUSCULAR | Status: DC | PRN
  Administered 2014-04-03: 30 mL via PERINEURAL

## 2014-04-03 MED ORDER — LIDOCAINE HCL (CARDIAC) 20 MG/ML IV SOLN
INTRAVENOUS | Status: AC
  Filled 2014-04-03: qty 5

## 2014-04-03 MED ORDER — OXYCODONE HCL 5 MG/5ML PO SOLN: 5.0000 mg | Freq: Once | ORAL | Status: DC | PRN

## 2014-04-03 MED ORDER — MIDAZOLAM HCL 2 MG/2ML IJ SOLN
1.0000 mg | INTRAMUSCULAR | Status: DC | PRN
  Administered 2014-04-03 (×2): 1 mg via INTRAVENOUS
  Filled 2014-04-03: qty 2

## 2014-04-03 MED ORDER — SODIUM CHLORIDE 0.45 % IV SOLN
INTRAVENOUS | Status: DC
  Administered 2014-04-04: 01:00:00 via INTRAVENOUS

## 2014-04-03 MED ORDER — FENTANYL CITRATE 0.05 MG/ML IJ SOLN
INTRAMUSCULAR | Status: DC | PRN
  Administered 2014-04-03: 25 ug via INTRAVENOUS
  Administered 2014-04-03: 50 ug via INTRAVENOUS
  Administered 2014-04-03 (×3): 25 ug via INTRAVENOUS
  Administered 2014-04-03: 50 ug via INTRAVENOUS
  Administered 2014-04-03: 25 ug via INTRAVENOUS

## 2014-04-03 MED ORDER — FENTANYL CITRATE 0.05 MG/ML IJ SOLN
50.0000 ug | INTRAMUSCULAR | Status: DC | PRN
  Administered 2014-04-03: 50 ug via INTRAVENOUS

## 2014-04-03 MED ORDER — HYDROMORPHONE HCL 1 MG/ML IJ SOLN
0.2500 mg | INTRAMUSCULAR | Status: DC | PRN
  Administered 2014-04-03: 0.5 mg via INTRAVENOUS

## 2014-04-03 MED ORDER — MEPERIDINE HCL 25 MG/ML IJ SOLN: 6.2500 mg | INTRAMUSCULAR | Status: DC | PRN

## 2014-04-03 MED ORDER — 0.9 % SODIUM CHLORIDE (POUR BTL) OPTIME
TOPICAL | Status: DC | PRN
  Administered 2014-04-03: 1000 mL

## 2014-04-03 MED ORDER — SENNA 8.6 MG PO TABS
1.0000 | ORAL_TABLET | Freq: Two times a day (BID) | ORAL | Status: DC
  Administered 2014-04-04 – 2014-04-07 (×8): 8.6 mg via ORAL
  Filled 2014-04-03 (×9): qty 1

## 2014-04-03 SURGICAL SUPPLY — 94 items
BANDAGE ELASTIC 3 VELCRO ST LF (GAUZE/BANDAGES/DRESSINGS) IMPLANT
BANDAGE ELASTIC 4 VELCRO ST LF (GAUZE/BANDAGES/DRESSINGS) ×3 IMPLANT
BIT DRILL 2.5X2.75 QC CALB (BIT) ×2 IMPLANT
BIT DRILL CALIBRATED 2.7 (BIT) ×1 IMPLANT
BIT DRILL CALIBRATED 2.7MM (BIT) ×1
BLADE SURG 15 STRL LF DISP TIS (BLADE) IMPLANT
BLADE SURG 15 STRL SS (BLADE) ×6
BNDG CMPR 9X4 STRL LF SNTH (GAUZE/BANDAGES/DRESSINGS)
BNDG CONFORM 3 STRL LF (GAUZE/BANDAGES/DRESSINGS) ×2 IMPLANT
BNDG ESMARK 4X9 LF (GAUZE/BANDAGES/DRESSINGS) ×1 IMPLANT
BNDG GAUZE ELAST 4 BULKY (GAUZE/BANDAGES/DRESSINGS) ×6 IMPLANT
CORDS BIPOLAR (ELECTRODE) ×3 IMPLANT
COVER MAYO STAND STRL (DRAPES) ×3 IMPLANT
COVER SURGICAL LIGHT HANDLE (MISCELLANEOUS) ×3 IMPLANT
CUFF TOURNIQUET SINGLE 18IN (TOURNIQUET CUFF) ×3 IMPLANT
CUFF TOURNIQUET SINGLE 24IN (TOURNIQUET CUFF) IMPLANT
DIS HUM POSTLAT RT 17H 147MM (Plate) ×3 IMPLANT
DRAPE IMP U-DRAPE 54X76 (DRAPES) ×3 IMPLANT
DRAPE INCISE IOBAN 66X45 STRL (DRAPES) ×3 IMPLANT
DRAPE OEC MINIVIEW 54X84 (DRAPES) IMPLANT
DRAPE U-SHAPE 47X51 STRL (DRAPES) ×2 IMPLANT
DRSG ADAPTIC 3X8 NADH LF (GAUZE/BANDAGES/DRESSINGS) ×2 IMPLANT
DRSG MEPITEL 4X7.2 (GAUZE/BANDAGES/DRESSINGS) ×2 IMPLANT
GAUZE SPONGE 4X4 12PLY STRL (GAUZE/BANDAGES/DRESSINGS) IMPLANT
GAUZE XEROFORM 1X8 LF (GAUZE/BANDAGES/DRESSINGS) IMPLANT
GAUZE XEROFORM 5X9 LF (GAUZE/BANDAGES/DRESSINGS) ×2 IMPLANT
GLOVE BIOGEL M STRL SZ7.5 (GLOVE) ×3 IMPLANT
GLOVE BIOGEL PI IND STRL 6.5 (GLOVE) IMPLANT
GLOVE BIOGEL PI INDICATOR 6.5 (GLOVE) ×4
GLOVE ECLIPSE 6.5 STRL STRAW (GLOVE) ×2 IMPLANT
GLOVE SS BIOGEL STRL SZ 8 (GLOVE) ×1 IMPLANT
GLOVE SUPERSENSE BIOGEL SZ 8 (GLOVE) ×2
GOWN STRL REUS W/ TWL LRG LVL3 (GOWN DISPOSABLE) ×2 IMPLANT
GOWN STRL REUS W/ TWL XL LVL3 (GOWN DISPOSABLE) ×3 IMPLANT
GOWN STRL REUS W/TWL LRG LVL3 (GOWN DISPOSABLE) ×6
GOWN STRL REUS W/TWL XL LVL3 (GOWN DISPOSABLE) ×9
KIT BASIN OR (CUSTOM PROCEDURE TRAY) ×3 IMPLANT
KIT ROOM TURNOVER OR (KITS) ×3 IMPLANT
LOOP VESSEL MAXI BLUE (MISCELLANEOUS) ×2 IMPLANT
MANIFOLD NEPTUNE II (INSTRUMENTS) ×3 IMPLANT
NDL HYPO 25GX1X1/2 BEV (NEEDLE) IMPLANT
NEEDLE HYPO 25GX1X1/2 BEV (NEEDLE) IMPLANT
NS IRRIG 1000ML POUR BTL (IV SOLUTION) ×3 IMPLANT
PACK ORTHO EXTREMITY (CUSTOM PROCEDURE TRAY) ×3 IMPLANT
PACK UNIVERSAL I (CUSTOM PROCEDURE TRAY) ×3 IMPLANT
PAD ARMBOARD 7.5X6 YLW CONV (MISCELLANEOUS) ×6 IMPLANT
PAD CAST 4YDX4 CTTN HI CHSV (CAST SUPPLIES) IMPLANT
PADDING CAST ABS 4INX4YD NS (CAST SUPPLIES) ×2
PADDING CAST ABS COTTON 4X4 ST (CAST SUPPLIES) IMPLANT
PADDING CAST COTTON 4X4 STRL (CAST SUPPLIES)
PLATE LOCK R LG 97X10.9X2.5X10 (Plate) IMPLANT
PLATE LOCK RT LRG (Plate) ×3 IMPLANT
SCREW CORT 3.5X26 (Screw) ×3 IMPLANT
SCREW CORT T15 24X3.5XST LCK (Screw) IMPLANT
SCREW CORT T15 26X3.5XST LCK (Screw) IMPLANT
SCREW CORT T15 28X3.5XST LCK (Screw) IMPLANT
SCREW CORTICAL 3.5X24MM (Screw) ×9 IMPLANT
SCREW CORTICAL 3.5X28MM (Screw) ×3 IMPLANT
SCREW LOCK CORT STAR 3.5X12 (Screw) ×2 IMPLANT
SCREW LOCK CORT STAR 3.5X14 (Screw) ×4 IMPLANT
SCREW LOCK CORT STAR 3.5X16 (Screw) ×2 IMPLANT
SCREW LOCK CORT STAR 3.5X18 (Screw) ×6 IMPLANT
SCREW LOCK CORT STAR 3.5X20 (Screw) ×6 IMPLANT
SCREW LOCK CORT STAR 3.5X22 (Screw) ×2 IMPLANT
SCREW LOCK CORT STAR 3.5X24 (Screw) ×4 IMPLANT
SCREW LOCK CORT STAR 3.5X40 (Screw) ×2 IMPLANT
SCREW LOW PROFILE 22MMX3.5MM (Screw) ×2 IMPLANT
SCREW NON LOCKING LP 3.5 16MM (Screw) ×2 IMPLANT
SOLUTION BETADINE 4OZ (MISCELLANEOUS) ×3 IMPLANT
SPLINT FIBERGLASS 4X30 (CAST SUPPLIES) ×2 IMPLANT
SPONGE GAUZE 4X4 12PLY STER LF (GAUZE/BANDAGES/DRESSINGS) ×2 IMPLANT
SPONGE LAP 18X18 X RAY DECT (DISPOSABLE) ×2 IMPLANT
SPONGE SCRUB IODOPHOR (GAUZE/BANDAGES/DRESSINGS) ×3 IMPLANT
STEM DIS HUM POSTLT RT 17H 147 (Plate) IMPLANT
STOCKINETTE 4X48 STRL (DRAPES) ×2 IMPLANT
SUCTION FRAZIER TIP 10 FR DISP (SUCTIONS) ×2 IMPLANT
SUT FIBERWIRE 2-0 18 17.9 3/8 (SUTURE) ×6
SUT MERSILENE 4 0 P 3 (SUTURE) IMPLANT
SUT PROLENE 3 0 PS 2 (SUTURE) ×8 IMPLANT
SUT PROLENE 4 0 PS 2 18 (SUTURE) ×6 IMPLANT
SUT VIC AB 2-0 CT1 27 (SUTURE)
SUT VIC AB 2-0 CT1 TAPERPNT 27 (SUTURE) IMPLANT
SUT VIC AB 3-0 SH 27 (SUTURE) ×12
SUT VIC AB 3-0 SH 27X BRD (SUTURE) IMPLANT
SUT VICRYL AB 3 0 TIES (SUTURE) ×2 IMPLANT
SUTURE FIBERWR 2-0 18 17.9 3/8 (SUTURE) IMPLANT
SYR CONTROL 10ML LL (SYRINGE) IMPLANT
TOWEL OR 17X24 6PK STRL BLUE (TOWEL DISPOSABLE) ×3 IMPLANT
TOWEL OR 17X26 10 PK STRL BLUE (TOWEL DISPOSABLE) ×8 IMPLANT
TUBE CONNECTING 12'X1/4 (SUCTIONS) ×2
TUBE CONNECTING 12X1/4 (SUCTIONS) ×2 IMPLANT
UNDERPAD 30X30 INCONTINENT (UNDERPADS AND DIAPERS) ×3 IMPLANT
WASHER 3.5MM (Orthopedic Implant) ×8 IMPLANT
WATER STERILE IRR 1000ML POUR (IV SOLUTION) ×1 IMPLANT

## 2014-04-03 NOTE — Anesthesia Preprocedure Evaluation (Addendum)
Anesthesia Evaluation  Patient identified by MRN, date of birth, ID band Patient awake    Reviewed: Allergy & Precautions, H&P , NPO status , Patient's Chart, lab work & pertinent test results, Unable to perform ROS - Chart review only  Airway        Dental   Pulmonary shortness of breath, former smoker,          Cardiovascular hypertension, Pt. on medications and Pt. on home beta blockers + CAD, + Peripheral Vascular Disease and +CHF     Neuro/Psych Anxiety CVA    GI/Hepatic hiatal hernia, GERD-  Controlled,  Endo/Other    Renal/GU      Musculoskeletal  (+) Arthritis -,   Abdominal   Peds  Hematology   Anesthesia Other Findings   Reproductive/Obstetrics                          Anesthesia Physical Anesthesia Plan  ASA: III  Anesthesia Plan:    Post-op Pain Management:    Induction:   Airway Management Planned:   Additional Equipment:   Intra-op Plan:   Post-operative Plan:   Informed Consent:   Plan Discussed with:   Anesthesia Plan Comments:         Anesthesia Quick Evaluation

## 2014-04-03 NOTE — Op Note (Signed)
See dictation #[id number] Amedeo Plenty MD

## 2014-04-03 NOTE — H&P (Signed)
  Patient is ready to proceed with ORIF of the right humerus  I have discussed with Hospitalist last evening and the family at length  Will proceed  We are planning surgery for your upper extremity. The risk and benefits of surgery include risk of bleeding infection anesthesia damage to normal structures and failure of the surgery to accomplish its intended goals of relieving symptoms and restoring function with this in mind we'll going to proceed. I have specifically discussed with the patient the pre-and postoperative regime and the does and don'ts and risk and benefits in great detail. Risk and benefits of surgery also include risk of dystrophy chronic nerve pain failure of the healing process to go onto completion and other inherent risks of surgery The relavent the pathophysiology of the disease/injury process, as well as the alternatives for treatment and postoperative course of action has been discussed in great detail with the patient who desires to proceed.  We will do everything in our power to help you (the patient) restore function to the upper extremity. Is a pleasure to see this patient today.   Hadia Minier MD

## 2014-04-03 NOTE — Progress Notes (Signed)
Heparin

## 2014-04-03 NOTE — Anesthesia Procedure Notes (Addendum)
Anesthesia Regional Block:  Supraclavicular block  Pre-Anesthetic Checklist: ,, timeout performed, Correct Patient, Correct Site, Correct Laterality, Correct Procedure, Correct Position, site marked, Risks and benefits discussed,  Surgical consent,  Pre-op evaluation,  At surgeon's request and post-op pain management  Laterality: Right  Prep: chloraprep       Needles:  Injection technique: Single-shot  Needle Type: Echogenic Stimulator Needle     Needle Length: 9cm 9 cm Needle Gauge: 21 and 21 G    Additional Needles:  Procedures: ultrasound guided (picture in chart) and nerve stimulator Supraclavicular block  Nerve Stimulator or Paresthesia:  Response: 0.4 mA,   Additional Responses:   Narrative:  Start time: 04/03/2014 3:30 PM End time: 04/03/2014 3:45 PM Injection made incrementally with aspirations every 5 mL.  Performed by: Personally  Anesthesiologist: Lillia Abed  Additional Notes: Monitors applied. Patient sedated. Sterile prep and drape,hand hygiene and sterile gloves were used. Relevant anatomy identified.Needle position confirmed.Local anesthetic injected incrementally after negative aspiration. Local anesthetic spread visualized around nerve(s). Vascular puncture avoided. No complications. Image printed for medical record.The patient tolerated the procedure well.        Procedure Name: MAC Date/Time: 04/03/2014 4:07 PM Performed by: Maeola Harman Pre-anesthesia Checklist: Patient identified, Emergency Drugs available, Suction available, Patient being monitored and Timeout performed Patient Re-evaluated:Patient Re-evaluated prior to inductionOxygen Delivery Method: Simple face mask Preoxygenation: Pre-oxygenation with 100% oxygen Intubation Type: IV induction Placement Confirmation: breath sounds checked- equal and bilateral and positive ETCO2 Dental Injury: Teeth and Oropharynx as per pre-operative assessment  Comments: Pt induced with fentanyl and  propofol infusion.  Placed pt on left side, venti mask on, + ETCO2, VSS. Waldron Session, CRNA

## 2014-04-03 NOTE — Progress Notes (Signed)
PHARMACY NOTE  Pharmacy Consult :  80 y.o. female is to restart Xarelto post-op as taken Prior to Admission for atrial fibrillation .   Dosing Wt :  66 kg  Hematology :  Recent Labs  04/01/14 0400 04/02/14 0200 04/02/14 0201 04/02/14 1220 04/03/14 0340 04/03/14 0620 04/03/14 0830  HGB 10.7* 10.0*  --   --   --  9.8*  --   HCT 33.4* 31.0*  --   --   --  30.5*  --   PLT 237 225  --   --   --  215  --   APTT 42* 76*  --  77* 41*  --   --   LABPROT 27.1*  --   --   --   --   --  13.8  INR 2.48*  --   --   --   --   --  1.05  HEPARINUNFRC >2.20* 0.98*  --   --  0.32  --   --   CREATININE 0.75  --  0.77  --   --   --   --     Current Medication[s] Include: Medication PTA: Prescriptions prior to admission  Medication Sig Dispense Refill Last Dose  . ALPRAZolam (XANAX) 0.25 MG tablet Take 0.5 tablets (0.125 mg total) by mouth every 3 (three) hours. (Patient taking differently: Take 0.125-0.25 mg by mouth 4 (four) times daily as needed. ) 180 tablet 0 03/31/2014 at Unknown time  . Cholecalciferol (VITAMIN D3) 5000 UNITS CAPS Take 5,000 Units by mouth daily.   03/31/2014 at Unknown time  . cloNIDine (CATAPRES) 0.1 MG tablet Take 1 tablet (0.1 mg total) by mouth every 2 (two) hours as needed (take as needed for a  Blood pressure that is greater than 160 on top, or bottom number greater than 100.). 30 tablet 3 last year  . DILTIAZEM HCL ER PO Take 180 mg by mouth 2 (two) times daily. Before meals   03/31/2014 at Unknown time  . furosemide (LASIX) 40 MG tablet Take 1 tablet (40 mg total) by mouth daily. (Patient taking differently: Take 40 mg by mouth daily as needed for fluid. ) 30 tablet 0 Past Week at Unknown time  . HYDROcodone-acetaminophen (NORCO/VICODIN) 5-325 MG per tablet Take 1 tablet by mouth 4 (four) times daily. 6 am, NOON, 6pm and MIDNIGHT   03/31/2014 at Unknown time  . ipratropium (ATROVENT) 0.06 % nasal spray Place 2 sprays into the nose 3  (three) times daily. 45 mL 0 03/31/2014 at Unknown time  . metoprolol succinate (TOPROL-XL) 50 MG 24 hr tablet TAKE 1 TABLET (50 MG TOTAL) BY MOUTH 2 (TWO) TIMES DAILY. TAKE WITH OR IMMEDIATELY FOLLOWING A MEAL. 60 tablet 0 03/31/2014 at 1600  . Multiple Vitamins-Minerals (CENTRUM SILVER PO) Take 1 tablet by mouth daily.   03/31/2014 at Unknown time  . pantoprazole (PROTONIX) 40 MG tablet TAKE 1 TABLET EVERY MORNING 30 tablet 0 03/31/2014 at Unknown time  . potassium chloride SA (K-DUR,KLOR-CON) 20 MEQ tablet TAKE 1 TABLET EVERY DAY 90 tablet 3 03/31/2014 at Unknown time  . traMADol (ULTRAM) 50 MG tablet Take 1 tablet (50 mg total) by mouth every 6 (six) hours as needed. (Patient taking differently: Take 50 mg by mouth every 6 (six) hours as needed for moderate pain. ) 30 tablet 0 unknown  . XARELTO 15 MG TABS tablet TAKE 1 TABLET EVERY DAY 90 tablet 0 03/31/2014 at Unknown time   Scheduled:  Scheduled:  . ALPRAZolam  0.125 mg Oral Q3H while awake  . chlorhexidine  60 mL Topical Once  . cholecalciferol  5,000 Units Oral Daily  . clobetasol cream   Topical BID  . diltiazem  180 mg Oral BID WC  . metoprolol succinate  50 mg Oral Daily  . multivitamin with minerals  1 tablet Oral Daily  . pantoprazole  40 mg Oral Daily  . [START ON 04/04/2014] rivaroxaban  10 mg Oral Q supper  . senna  1 tablet Oral BID  . vancomycin  1,000 mg Intravenous Once   Infusion[s]: Infusions:  . sodium chloride     Antibiotic[s]: Anti-infectives    Start     Dose/Rate Route Frequency Ordered Stop   04/04/14 0600  vancomycin (VANCOCIN) IVPB 1000 mg/200 mL premix  Status:  Discontinued     1,000 mg200 mL/hr over 60 Minutes Intravenous On call to O.R. 04/03/14 2116 04/03/14 2306   04/03/14 2315  vancomycin (VANCOCIN) IVPB 1000 mg/200 mL premix     1,000 mg200 mL/hr over 60 Minutes Intravenous  Once 04/03/14 2302     04/01/14 1800  ceFAZolin (ANCEF) IVPB 1 g/50 mL premix  Status:  Discontinued     1 g100 mL/hr  over 30 Minutes Intravenous Every 8 hours 04/01/14 1659 04/03/14 2116      Assessment :  S/p ORIF today.  Patient is to restart Xarelto in 24 hours.  Will restart at previous home dose.  Goal :  Adequate VTE prophylaxis with Xarelto.  Dosed for age, renal function.  Plan : 1. Restart Xarelto 10 mg po q supper.  First dose 04/04/14.  Firas Guardado, Craig Guess,  Pharm.D  04/03/2014  11:07 PM

## 2014-04-03 NOTE — Progress Notes (Signed)
Humerus fx

## 2014-04-03 NOTE — Progress Notes (Signed)
Orthopedics Progress Note  Subjective: Patient reports minimal pain in the right splinted arm.  She denies any pain in other areas, specifically no neck or back pain.  Objective:  Filed Vitals:   04/03/14 0615  BP: 138/61  Pulse: 102  Temp: 98.6 F (37 C)  Resp: 18    General: Awake and alert  Musculoskeletal: right UE splinted with some edema in the hand but well-perfused and patient able to move the hand and fingers easily without pain, Left UE with crepitus from PROM shoulder but no pain with passive elbow or wrist ROM, neck nontender, bilateral LEs with pain free AROM Neurovascularly intact  Lab Results  Component Value Date   WBC 6.3 04/03/2014   HGB 9.8* 04/03/2014   HCT 30.5* 04/03/2014   MCV 88.7 04/03/2014   PLT 215 04/03/2014       Component Value Date/Time   NA 140 04/02/2014 0201   K 4.7 04/02/2014 0201   CL 103 04/02/2014 0201   CO2 27 04/02/2014 0201   GLUCOSE 123* 04/02/2014 0201   BUN 12 04/02/2014 0201   CREATININE 0.77 04/02/2014 0201   CALCIUM 9.4 04/02/2014 0201   GFRNONAA 76* 04/02/2014 0201   GFRAA 88* 04/02/2014 0201    Lab Results  Component Value Date   INR 2.48* 04/01/2014   INR 1.4* 07/15/2013   INR 0.99 01/13/2013    Assessment/Plan: S/p fal with displaced distal humerus fracture awaiting normalization of INR (results pending this morning) Appreciate Dr Vanetta Shawl expertise and care with this difficult fracture location.  Secondary survey reveals no additional injury at this point other than the right humerus fracture and the left knee laceration Will follow.   Doran Heater. Veverly Fells, MD 04/03/2014 7:47 AM

## 2014-04-03 NOTE — Anesthesia Postprocedure Evaluation (Signed)
  Anesthesia Post-op Note  Patient: Jacqueline [name]  Procedure(s) Performed: Procedure(s): OPEN REDUCTION INTERNAL FIXATION (ORIF) RIGHT HUMERUS DISTAL FRACTURE (Right)  Patient Location: PACU  Anesthesia Type: Gen   Level of Consciousness: awake, alert  and oriented  Airway and Oxygen Therapy: Patient Spontanous Breathing  Post-op Pain: mild  Post-op Assessment: Post-op Vital signs reviewed  Post-op Vital Signs: Reviewed  Last Vitals:  Filed Vitals:   04/03/14 1930  BP: 126/64  Pulse: 94  Temp:   Resp: 27    Complications: No apparent anesthesia complications

## 2014-04-03 NOTE — Progress Notes (Signed)
[place] for Heparin Indication: atrial fibrillation  Allergies  Allergen Reactions  . Metoclopramide Hcl Other (See Comments)     Tremors  . Penicillins Itching  . Aspirin Other (See Comments)    "gave me stomach aches; made me bleed" (03/13/12)  . Zetia [Ezetimibe] Other (See Comments)    Stomach and back pain    Patient Measurements: Height: 5' 1.81" (157 cm) Weight: 145 lb 1 oz (65.8 kg) IBW/kg (Calculated) : 49.67   Vital Signs: Temp: 98.6 F (37 C) (12/03 0615) BP: 136/81 mmHg (12/03 0948) Pulse Rate: 102 (12/03 0948)  Labs:  Recent Labs  03/31/14 1959  04/01/14 0400 04/02/14 0200 04/02/14 0201 04/02/14 1220 04/03/14 0340 04/03/14 0620 04/03/14 0830  HGB 11.8*  --  10.7* 10.0*  --   --   --  9.8*  --   HCT 35.9*  --  33.4* 31.0*  --   --   --  30.5*  --   PLT 259  --  237 225  --   --   --  215  --   APTT  --   < > 42* 76*  --  77* 41*  --   --   LABPROT  --   --  27.1*  --   --   --   --   --  13.8  INR  --   --  2.48*  --   --   --   --   --  1.05  HEPARINUNFRC  --   --  >2.20* 0.98*  --   --  0.32  --   --   CREATININE 0.69  --  0.75  --  0.77  --   --   --   --   < > = values in this interval not displayed.  Estimated Creatinine Clearance: 47.2 mL/min (by C-G formula based on Cr of 0.77).  Assessment: 80 yo female with right arm fracture, h/o Afib and Xarelto on hold, for heparin.  APTT and heparin levels now correlating so can [place] heparin levels to guide therpay. HL therapeutic this am.  OR scheduled for later today  Goal of Therapy:  Heparin level 0.3-0.7 units/ml Monitor platelets by anticoagulation protocol: Yes   Plan:  Continue Heparin at current rate  F/u postop   Jamarian Jacinto Poteet 04/03/2014,11:22 AM

## 2014-04-03 NOTE — Clinical Social Work Psychosocial (Signed)
Clinical Social Work Department BRIEF PSYCHOSOCIAL ASSESSMENT 04/03/2014  Patient:  Jacqueline [name], Jacqueline [name]     Account Number:  [id number]     Admit date:  03/31/2014  Clinical Social Worker:  Wylene Men  Date/Time:  04/03/2014 12:35 PM  Referred by:  Physician  Date Referred:  04/03/2014 Referred for  SNF Placement  Psychosocial assessment   Other Referral:   nonen   Interview type:  Patient Other interview type:   none    PSYCHOSOCIAL DATA Living Status:  ALONE Admitted from facility:   Level of care:   Primary support name:  Abigail Butts Primary support relationship to patient:  CHILD, ADULT Degree of support available:   strong    CURRENT CONCERNS Current Concerns  Post-Acute Placement   Other Concerns:   none    SOCIAL WORK ASSESSMENT / PLAN CSW assessed pt at bedside.  Patient was alert and oriented during the time of this assessment.  Patient states she is from home alone.  Patient states she lives in a [place] at [facility] and lives a quiet life.  PT is recommending SNF at time of discharge. Patient is aware and reports she has spoken to her children regarding this disposition. Patient reports children will assist her in making these decisions.  Patient is scheduled for surgery today at 3pm. Patient remains hopeful to return to independent living after completion of STR/SNF.  Patient has verbally agreed to SNF search of [facility].   Assessment/plan status:  Psychosocial Support/Ongoing Assessment of Needs Other assessment/ plan:   FL2  PASARR   Information/referral to community resources:   SNF/STR    PATIENT'S/FAMILY'S RESPONSE TO PLAN OF CARE: Patient is agreeable to SNF/STR.  SNF search of [facility] will be started.       Nonnie Done, [place] [phone]  Psychiatric & Orthopedics (5N 1-16) Clinical Social Worker

## 2014-04-03 NOTE — Progress Notes (Signed)
[facility] PROGRESS NOTE  Jacqueline [name] ACZ:[id number] DOB: Sep 12, 1928 DOA: 03/31/2014 PCP: [name], MD  Assessment/Plan: 80 y.o. female with past medical history of HTN, GERD, A fib, h/o CVAs, CHF who presents with right arm and left knee pain after fall.  1. Fall mechanical with R humeral fracture  -surgery scheduled 12/3; periop heparin IV bridging due  (A fib +h/o recurrent CVAs) -cont pain control;' monitor   2. A fib on xarelto at home, BB; patient denies acute cardiopulmonary symptoms';  -echo (2014): LVEF: 55% to 60%. Wall motion was normal -cont BB, cardizem;BB, on IV heparin as above;   3. HTN on BB, cardizem, lasix; BP is soft; diuretic on hold periop; close monitor  4. Anemia, no s/s of acute bleeding; close monitor  5. H/o Diastolic CHF, clinically euvolemic; lasix on hold periop; resume diuretics post op;      D/w patient, called her son  Adilyn, Humes   [phone]  Then Called updated her son  [name], [name]   [phone]   Code Status: full Family Communication: d/w patient, no family at the bedside (indicate person spoken with, relationship, and if by phone, the number) Disposition Plan: pend surgery    Consultants:  ortho  Procedures:  ORIF pend 12/3   Antibiotics:  periop (indicate start date, and stop date if known)  HPI/Subjective: Alert, reports mild arm pain   Objective: Filed Vitals:   04/03/14 0948  BP: 136/81  Pulse: 102  Temp:   Resp:     Intake/Output Summary (Last 24 hours) at 04/03/14 0958 Last data filed at 04/03/14 2376  Gross per 24 hour  Intake 860.86 ml  Output      0 ml  Net 860.86 ml   Filed Weights   04/01/14 0549 04/02/14 0550 04/03/14 0627  Weight: 65.7 kg (144 lb 13.5 oz) 65.8 kg (145 lb 1 oz) 65.8 kg (145 lb 1 oz)    Exam:   General:  Alert, oriented   Cardiovascular: s1,s2 rrr  Respiratory: CTA BL  Abdomen: soft, nt,nd   Musculoskeletal: no pedal edema   Data  Reviewed: Basic Metabolic Panel:  Recent Labs Lab 03/31/14 1959 04/01/14 0400 04/02/14 0201  NA 142 140 140  K 4.6 4.6 4.7  CL 105 103 103  CO2 24 24 27   GLUCOSE 109* 113* 123*  BUN 16 16 12   CREATININE 0.69 0.75 0.77  CALCIUM 9.4 9.6 9.4   Liver Function Tests: No results for input(s): AST, ALT, ALKPHOS, BILITOT, PROT, ALBUMIN in the last 168 hours. No results for input(s): LIPASE, AMYLASE in the last 168 hours. No results for input(s): AMMONIA in the last 168 hours. CBC:  Recent Labs Lab 03/31/14 1959 04/01/14 0400 04/02/14 0200 04/03/14 0620  WBC 6.1 8.1 7.1 6.3  HGB 11.8* 10.7* 10.0* 9.8*  HCT 35.9* 33.4* 31.0* 30.5*  MCV 90.7 89.5 88.8 88.7  PLT 259 237 225 215   Cardiac Enzymes: No results for input(s): CKTOTAL, CKMB, CKMBINDEX, TROPONINI in the last 168 hours. BNP (last 3 results) No results for input(s): PROBNP in the last 8760 hours. CBG:  Recent Labs Lab 04/01/14 0933 04/02/14 0635 04/03/14 0629  GLUCAP 91 98 111*    Recent Results (from the past 240 hour(s))  Surgical pcr screen     Status: Abnormal   Collection Time: 04/03/14  4:21 AM  Result Value Ref Range Status   MRSA, PCR NEGATIVE NEGATIVE Final   Staphylococcus aureus POSITIVE (A) NEGATIVE Final    Comment:  The Xpert SA Assay (FDA approved for NASAL specimens in patients over 47 years of age), is one component of a comprehensive surveillance program.  Test performance has been validated by [employer] for patients greater than or equal to 52 year old. It is not intended to diagnose infection nor to guide or monitor treatment.      Studies: Ct Humerus Right Wo Contrast  04/01/2014   CLINICAL DATA:  Distal humeral fracture  EXAM: CT OF THE RIGHT HUMERUS WITHOUT CONTRAST  TECHNIQUE: Multidetector CT imaging was performed according to the standard protocol. Multiplanar CT image reconstructions were also generated.  COMPARISON:  03/31/2014  FINDINGS: Severe glenohumeral  degenerative arthropathy with marked loss of articular space and bone-on-bone appearance, with prominent spurring. Glenohumeral joint effusion observed. Oblique fracture of the distal humeral shaft with 22 degrees of apex posterior angulation, 1.6 cm of overlap, 6 mm of lateral displacement of the distal fragment, and 6 mm anterior displacement of the distal fragment.  IMPRESSION: 1. Oblique fracture of the distal humerus with mild overlap, mild displacement, and 22 degrees of apex posterior angulation. 2. Severe degenerative glenohumeral arthropathy with glenohumeral joint effusion.   Electronically Signed   By: Sherryl Barters M.D.   On: 04/01/2014 10:35   Ct 3d Recon At Scanner  04/01/2014   CLINICAL DATA:  Nonspecific (abnormal) findings on radiological and other examination of musculoskeletal sysem. Distal humeral fracture.  EXAM: 3-DIMENSIONAL CT IMAGE RENDERING ON ACQUISITION WORKSTATION  TECHNIQUE: 3-dimensional CT images were rendered by post-processing of the original CT data on an acquisition workstation. The 3-dimensional CT images were interpreted and findings were reported in the accompanying complete CT report for this study  COMPARISON:  04/01/2014 ; 03/31/2014  FINDINGS: Please see accession [id number] for description of findings.  IMPRESSION: Please see accession [id number] for description of impressions.   Electronically Signed   By: Sherryl Barters M.D.   On: 04/01/2014 10:48   Dg Knee Complete 4 Views Left  04/01/2014   CLINICAL DATA:  Fall, patellar laceration, acute pain  EXAM: LEFT KNEE - COMPLETE 4+ VIEW  COMPARISON:  None.  FINDINGS: Bones are osteopenic. Normal alignment without fracture or significant effusion. Relatively preserved joint spaces. Peripheral calcific atherosclerosis present.  IMPRESSION: No acute finding.   Electronically Signed   By: Daryll Brod M.D.   On: 04/01/2014 20:47    Scheduled Meds: . ALPRAZolam  0.125 mg Oral Q3H while awake  .  ceFAZolin (ANCEF) IV  1  g Intravenous Q8H  . cholecalciferol  5,000 Units Oral Daily  . clobetasol cream   Topical BID  . diltiazem  180 mg Oral BID WC  . metoprolol succinate  50 mg Oral Daily  . multivitamin with minerals  1 tablet Oral Daily  . pantoprazole  40 mg Oral Daily  . sodium chloride  3 mL Intravenous Q12H   Continuous Infusions: . heparin 850 Units/hr (04/02/14 2348)    Principal Problem:   Right arm fracture Active Problems:   Essential hypertension   CVA (cerebral infarction)   Osteoarthritis   History of peptic ulcer disease   PAF (paroxysmal atrial fibrillation)   Chronic diastolic CHF (congestive heart failure)    Time spent: >35 minutes     Kinnie Feil  [facility] Pager [phone]. If 7PM-7AM, please contact night-coverage at [email or web address], password [facility] 04/03/2014, 9:58 AM  LOS: 3 days

## 2014-04-03 NOTE — Transfer of Care (Signed)
Immediate Anesthesia Transfer of Care Note  Patient: Jacqueline [name]  Procedure(s) Performed: Procedure(s): OPEN REDUCTION INTERNAL FIXATION (ORIF) RIGHT HUMERUS DISTAL FRACTURE (Right)  Patient Location: PACU  Anesthesia Type:Regional  Level of Consciousness: awake, alert  and patient cooperative  Airway & Oxygen Therapy: Patient Spontanous Breathing and Patient connected to nasal cannula oxygen  Post-op Assessment: Report given to PACU RN and Post -op Vital signs reviewed and stable  Post vital signs: Reviewed and stable  Complications: No apparent anesthesia complications

## 2014-04-04 ENCOUNTER — Encounter (HOSPITAL_COMMUNITY): Payer: Self-pay | Admitting: Orthopedic Surgery

## 2014-04-04 DIAGNOSIS — S42302A Unspecified fracture of shaft of humerus, left arm, initial encounter for closed fracture: Principal | ICD-10-CM

## 2014-04-04 LAB — GLUCOSE, CAPILLARY
Glucose-Capillary: 118 mg/dL — ABNORMAL HIGH (ref 70–99)
Glucose-Capillary: 155 mg/dL — ABNORMAL HIGH (ref 70–99)

## 2014-04-04 LAB — CBC
HCT: 29.2 % — ABNORMAL LOW (ref 36.0–46.0)
Hemoglobin: 9.7 g/dL — ABNORMAL LOW (ref 12.0–15.0)
MCH: 29.7 pg (ref 26.0–34.0)
MCHC: 33.2 g/dL (ref 30.0–36.0)
MCV: 89.3 fL (ref 78.0–100.0)
Platelets: 237 10*3/uL (ref 150–400)
RBC: 3.27 MIL/uL — AB (ref 3.87–5.11)
RDW: 15.8 % — ABNORMAL HIGH (ref 11.5–15.5)
WBC: 9.4 10*3/uL (ref 4.0–10.5)

## 2014-04-04 LAB — HEPARIN LEVEL (UNFRACTIONATED)

## 2014-04-04 LAB — APTT: aPTT: 33 seconds (ref 24–37)

## 2014-04-04 MED ORDER — RIVAROXABAN 15 MG PO TABS
15.0000 mg | ORAL_TABLET | Freq: Every day | ORAL | Status: DC
  Administered 2014-04-05 – 2014-04-06 (×2): 15 mg via ORAL
  Filled 2014-04-04 (×3): qty 1

## 2014-04-04 MED ORDER — RIVAROXABAN 15 MG PO TABS
15.0000 mg | ORAL_TABLET | Freq: Every day | ORAL | Status: DC
  Filled 2014-04-04: qty 1

## 2014-04-04 MED ORDER — INFLUENZA VAC SPLIT QUAD 0.5 ML IM SUSY
0.5000 mL | PREFILLED_SYRINGE | INTRAMUSCULAR | Status: AC
  Administered 2014-04-05: 0.5 mL via INTRAMUSCULAR
  Filled 2014-04-04: qty 0.5

## 2014-04-04 MED ORDER — RIVAROXABAN 10 MG PO TABS
10.0000 mg | ORAL_TABLET | Freq: Once | ORAL | Status: AC
  Administered 2014-04-04: 10 mg via ORAL
  Filled 2014-04-04: qty 1

## 2014-04-04 NOTE — Progress Notes (Signed)
[facility] PROGRESS NOTE  Jacqueline [name] BTD:[id number] DOB: 1928-07-19 DOA: 03/31/2014 PCP: [name], MD  Assessment/Plan: 80 y.o. female with past medical history of HTN, GERD, A fib, h/o CVAs, CHF who presents with right arm and left knee pain after fall.  1. Fall mechanical with R humeral fracture  -Pt is s/p pod # 1 OPEN REDUCTION INTERNAL FIXATION (ORIF) RIGHT HUMERUS DISTAL FRACTURE (Right) - Ortho on board - discussed when to continue [place] with orthopaedic surgeon from his standpoint. Will start low dose today and then resume home regimen tomorrow 04/05/14  2. A fib on xarelto at home, BB; patient continues to denie acute cardiopulmonary symptoms';  -echo (2014): LVEF: 55% to 60%. Wall motion was normal -please see xarelto discussion above.   3. HTN on BB, cardizem, lasix; BP is soft; diuretic on hold periop; close monitor  4. Anemia, no s/s of acute bleeding; close monitor  5. H/o Diastolic CHF, clinically euvolemic; lasix on hold periop; resume diuretics post op;     D/w patient, called her son  Jacqueline [name], Jacqueline [name]   [phone]  Then Called updated her son  Jacqueline [name], Jacqueline [name]   [phone]   Code Status: full Family Communication: d/w patient, no family at the bedside  Disposition Plan: Pending physical therapy evaluation and recommendations. Most likely snf   Consultants:  ortho  Procedures:  ORIF pend 12/3   Antibiotics:  periop (indicate start date, and stop date if known)  HPI/Subjective: No new complaints. No acute issues reported overnight.  Objective: Filed Vitals:   04/04/14 0830  BP: 135/71  Pulse:   Temp:   Resp:     Intake/Output Summary (Last 24 hours) at 04/04/14 1417 Last data filed at 04/04/14 0500  Gross per 24 hour  Intake   1100 ml  Output    170 ml  Net    930 ml   Filed Weights   04/01/14 0549 04/02/14 0550 04/03/14 0627  Weight: 65.7 kg (144 lb 13.5 oz) 65.8 kg (145 lb 1 oz) 65.8 kg (145 lb 1 oz)     Exam:   General:  Alert, oriented , in nad  Cardiovascular: s1,s2 rrr, no rubs  Respiratory: CTA BL, no wheezes  Abdomen: soft, nt,nd   Musculoskeletal: right arm wrapped in ace bandage. No active bleeding  Data Reviewed: Basic Metabolic Panel:  Recent Labs Lab 03/31/14 1959 04/01/14 0400 04/02/14 0201  NA 142 140 140  K 4.6 4.6 4.7  CL 105 103 103  CO2 24 24 27   GLUCOSE 109* 113* 123*  BUN 16 16 12   CREATININE 0.69 0.75 0.77  CALCIUM 9.4 9.6 9.4   Liver Function Tests: No results for input(s): AST, ALT, ALKPHOS, BILITOT, PROT, ALBUMIN in the last 168 hours. No results for input(s): LIPASE, AMYLASE in the last 168 hours. No results for input(s): AMMONIA in the last 168 hours. CBC:  Recent Labs Lab 03/31/14 1959 04/01/14 0400 04/02/14 0200 04/03/14 0620 04/04/14 0705  WBC 6.1 8.1 7.1 6.3 9.4  HGB 11.8* 10.7* 10.0* 9.8* 9.7*  HCT 35.9* 33.4* 31.0* 30.5* 29.2*  MCV 90.7 89.5 88.8 88.7 89.3  PLT 259 237 225 215 237   Cardiac Enzymes: No results for input(s): CKTOTAL, CKMB, CKMBINDEX, TROPONINI in the last 168 hours. BNP (last 3 results) No results for input(s): PROBNP in the last 8760 hours. CBG:  Recent Labs Lab 04/01/14 0933 04/02/14 0635 04/03/14 0629 04/04/14 0636 04/04/14 1246  GLUCAP 91 98 111* 118* 155*    Recent Results (from  the past 240 hour(s))  Surgical pcr screen     Status: Abnormal   Collection Time: 04/03/14  4:21 AM  Result Value Ref Range Status   MRSA, PCR NEGATIVE NEGATIVE Final   Staphylococcus aureus POSITIVE (A) NEGATIVE Final    Comment:        The Xpert SA Assay (FDA approved for NASAL specimens in patients over 46 years of age), is one component of a comprehensive surveillance program.  Test performance has been validated by [employer] for patients greater than or equal to 15 year old. It is not intended to diagnose infection nor to guide or monitor treatment.      Studies: No results  found.  Scheduled Meds: . ALPRAZolam  0.125 mg Oral Q3H while awake  . chlorhexidine  60 mL Topical Once  . cholecalciferol  5,000 Units Oral Daily  . clobetasol cream   Topical BID  . diltiazem  180 mg Oral BID WC  . [START ON 04/05/2014] Influenza vac split quadrivalent PF  0.5 mL Intramuscular Tomorrow-1000  . metoprolol succinate  50 mg Oral Daily  . multivitamin with minerals  1 tablet Oral Daily  . pantoprazole  40 mg Oral Daily  . rivaroxaban  10 mg Oral Once  . [START ON 04/05/2014] rivaroxaban  15 mg Oral Q supper  . senna  1 tablet Oral BID   Continuous Infusions: . sodium chloride 75 mL/hr at 04/04/14 0031    Principal Problem:   Right arm fracture Active Problems:   Essential hypertension   CVA (cerebral infarction)   Osteoarthritis   History of peptic ulcer disease   PAF (paroxysmal atrial fibrillation)   Chronic diastolic CHF (congestive heart failure)   Displaced fracture of shaft of left humerus    Time spent: >35 minutes     Velvet Bathe  [facility] Pager [phone] If 7PM-7AM, please contact night-coverage at [email or web address], password [facility] 04/04/2014, 2:17 PM  LOS: 4 days

## 2014-04-04 NOTE — Evaluation (Signed)
Physical Therapy Re-Evaluation Patient Details Name: Jacqueline [name] MRN: [id number] DOB: 06-01-1928 Today's Date: 04/04/2014   History of Present Illness  80 y.o. female admitted to [facility] after fall at home with R displaced humerus fx.  Ortho consulted and surgery scheduled for 04/03/14.  Pt with significant PMHx of dyspnea, anxiety, HTN,A-fib with RVR, OA, degernation of lumbar discs, CVA, and R shoulder arthroscopy.     Clinical Impression  Pt now s/p ORIF for R distal humerus fracture.  Requires mod A for bed mobility with heavy reliance on LUE.  Also able to take short steps to pivot to recliner with L HHA.  Note posterior lean and very weak hip extensors.  Pt will benefit from skilled acute services to address deficits.  PT recommends SNF for follow up at D/C.  Pt seems very confused during eval and initially refusing therapy with discussion on "letting kids figure it out."  Educated on importance of getting OOB to chair.  Also educated on need for SNF at D/C.  Seemed more agreeable at end of session, however unsure of baseline cognition.     Follow Up Recommendations SNF;Supervision/Assistance - 24 hour    Equipment Recommendations  Wheelchair (measurements PT);Wheelchair cushion (measurements PT)    Recommendations for Other Services OT consult     Precautions / Restrictions Precautions Precautions: Fall Precaution Comments: pt with h/o fall, but this is first fx due to fall Required Braces or Orthoses: Sling (for comfort during transfers, keep elevated) Restrictions Weight Bearing Restrictions: Yes RUE Weight Bearing: Non weight bearing      Mobility  Bed Mobility Overal bed mobility: Needs Assistance Bed Mobility: Supine to Sit     Supine to sit: Mod assist;HOB elevated Sit to supine: Mod assist   General bed mobility comments: Pt able to bring BLEs out of bed and did well scooting hips to EOB.  Requires HHA to elevate trunk into sitting position with mod A and cues for safety  and technique.   Transfers Overall transfer level: Needs assistance Equipment used: 1 person hand held assist Transfers: Sit to/from [facility] Sit to Stand: Mod assist Stand pivot transfers: Mod assist       General transfer comment: Pt able to rise from bed with mod A with facilitation for forward momentum and forward trunk lean.  Mod cues for upright posture with hip extension and to maintain NWB through RUE.  Pt continues to demonstrate posterior lean in standing and during transfer to recliner.   Ambulation/Gait Ambulation/Gait assistance:  (did not attempt due to safety and pt refusal)              Stairs            Wheelchair Mobility    Modified Rankin (Stroke Patients Only)       Balance Overall balance assessment: Needs assistance Sitting-balance support: Feet supported;Single extremity supported Sitting balance-Leahy Scale: Fair   Postural control: Posterior lean Standing balance support: Single extremity supported;During functional activity Standing balance-Leahy Scale: Poor Standing balance comment: Pt relies on support of therapist through LUE during transfer.                              Pertinent Vitals/Pain Pain Assessment: Faces Faces Pain Scale: Hurts even more Pain Location: RUE Pain Descriptors / Indicators: Aching;Sore Pain Intervention(s): Monitored during session;Repositioned    Home Living Family/patient expects to be discharged to:: Skilled nursing facility Living Arrangements: Alone Available  Help at Discharge: Family;Available PRN/intermittently Type of Home: House (town home) Home Access: Level entry     Home Layout: One level Home Equipment: Environmental consultant - 4 wheels;Bedside commode;Shower seat      Prior Function     Gait / Transfers Assistance Needed: per pt she uses the rollator for community ambulation  ADL's / Homemaking Assistance Needed: son does all of the housework  Comments: Daughter  drives to get her groceries, she heats up food does not prepare.      Hand Dominance   Dominant Hand: Right    Extremity/Trunk Assessment   Upper Extremity Assessment: Defer to OT evaluation           Lower Extremity Assessment: Generalized weakness RLE Deficits / Details: bil legs with generalized weakness per functional mobility.  At least 3/5 per gross functional assessment.  LLE Deficits / Details: L knee still in ace wrap, did not seem to limit pt during transfers or bed mobility.   Cervical / Trunk Assessment: Kyphotic  Communication   Communication: No difficulties  Cognition Arousal/Alertness: Awake/alert Behavior During Therapy: WFL for tasks assessed/performed Overall Cognitive Status: No family/caregiver present to determine baseline cognitive functioning       Memory: Decreased short-term memory              General Comments      Exercises        Assessment/Plan    PT Assessment Patient needs continued PT services  PT Diagnosis Difficulty walking;Generalized weakness;Acute pain   PT Problem List Decreased strength;Decreased range of motion;Decreased activity tolerance;Decreased balance;Decreased mobility;Decreased cognition;Decreased safety awareness;Decreased knowledge of precautions;Pain  PT Treatment Interventions DME instruction;Gait training;Functional mobility training;Therapeutic activities;Therapeutic exercise;Balance training;Cognitive remediation;Patient/family education   PT Goals (Current goals can be found in the Care Plan section) Acute Rehab PT Goals Patient Stated Goal: to decrease right arm pain PT Goal Formulation: With patient Time For Goal Achievement: 04/11/14 Potential to Achieve Goals: Fair    Frequency Min 5X/week   Barriers to discharge Decreased caregiver support      Co-evaluation               End of Session Equipment Utilized During Treatment: Other (comment) (R arm sling during transfer (removed  following transfer)) Activity Tolerance: Patient limited by pain Patient left: in chair;with call bell/phone within reach;with chair alarm set Nurse Communication: Mobility status         Time: 5248-1859 PT Time Calculation (min) (ACUTE ONLY): 27 min   Charges:   PT Evaluation $PT Re-evaluation: 1 Procedure PT Treatments $Therapeutic Activity: 23-37 mins   PT G Codes:          Denice Bors 04/04/2014, 10:57 AM

## 2014-04-04 NOTE — Clinical Social Work Note (Signed)
CSW faxed clinical information to SNF/STR facilities in [facility] per patient request and PT recommendation.  Official placement note to be completed once system network is available.  Nonnie Done, [place] [phone]  Psychiatric & Orthopedics (5N 1-16) Clinical Social Worker

## 2014-04-04 NOTE — Op Note (Signed)
Jacqueline [name], Jacqueline [name] NO.:  [id number]  MEDICAL RECORD NO.:  [id number]  LOCATION:  [id number]                        FACILITY:  [facility]  PHYSICIAN:  Satira Anis. Allayna Erlich, M.D.DATE OF BIRTH:  11-10-1928  DATE OF PROCEDURE: DATE OF DISCHARGE:                              OPERATIVE REPORT   PREOPERATIVE DIAGNOSIS:  Right distal third spiral humerus fracture, with advanced displacement.  POSTOPERATIVE DIAGNOSIS:  Right distal third spiral humerus fracture, with advanced displacement.  PROCEDURE: 1. Open reduction, internal fixation, distal third humerus fracture     with a 90-90 double plating. 2. Ulnar nerve decompression and anterior transposition. 3. Radial nerve exploration, neurolysis, and decompression. 4. AP, lateral, and oblique x-rays performed, examined, and     interpreted by myself.  SURGEON:  Satira Anis. Amedeo Plenty, M.D.  ASSISTANT:  Avelina Laine, P.A.-C.  TOURNIQUET TIME:  Less than 90 minutes.  DRAINS:  One.  INDICATIONS FOR THE PROCEDURE:  An 80 year old female with a severe fracture to her distal humerus.  She is on Xarelto and that was bridged with heparin and we waited 2-1/2 to 3 days before operative procedure. She has remained stable.  She has had a gash on her knee.  I have discussed with she and her family risks and benefits of the surgery including risk of infection, bleeding, anesthesia, damage to normal structures, and failure of surgery to accomplish its intended goals, relieving symptoms, and restoring function.  With this in mind, she desires proceed.  All questions have been encouraged and answered preoperatively.  The patient is seen by myself and anesthesia block was given by Dr. Conrad New Deal.  The patient tolerated this well.  Following this, she was prepped and draped in a sterile fashion in the operative arena after being placed in a modified lateral position.  Once in a modified floppy lateral with axillary roll placed and body parts  well padded, we prepped and draped her and prescrubbed with Betadine and Hibiclens followed with additional 10 minutes surgical Betadine scrub.  Her skin was very thin, but descent for the surgical endeavors.  Outline marks were made.  Sterile field secured.  Drapes placed.  Final time-out and pre and postop check was complete and following this, she underwent tourniquet insufflation.  This was sterile tourniquet. Midline posterior incision was made, utilitarian in nature.  At this time, we identified the ulnar nerve.  Performed decompression about the 5 sites of compression including arcade of Struthers, medial intermuscular septum, Osborne ligament, 2 heads of the FCU, superficial and deep; and the cubital tunnel itself.  The nerve was gently mobilized on its epineural plexus and vessels and move forward.  Following this, we performed the triceps split and dissected down to the bony fracture. There was a large spike just adjacent to the radial nerve and I thus swept the spike off of the nerve and performed an extensive radial nerve dissection including neurolysis.  The radial nerve was freed throughout the course of its posterior and lateral pathway.  Following this, the patient then underwent identification of the fracture in more detail. It was treated with curettage, suction, irrigation, and following this, it was reduced and held.  We  then performed a medial elbow plate from a [employer] and a posterolateral plate was placed as well.  This achieved radiographic parameters to our satisfaction.  Combination of locking and nonlocking screws were placed to achieve 90-90 fixation.  Fixation was deemed as excellent in my opinion.  I was pleased with this.  She had full range of motion.  No complicating features.  No impingement posteriorly or anteriorly.  Following this, final AP, lateral, and oblique x-rays performed, examined, and interpreted by myself, saved and deemed to be excellent.  I  was pleased with the screw length and findings.  Following this, radial nerve was checked once again.  Following this, ulnar nerve underwent anterior transposition.  Fascia was tied down over.  A medial intermuscular septum was used to create a bumper to prevent displacement.  Following this, 2 L of saline placed in wound followed by closure of the triceps split with 2-0 FiberWire followed by closure of the subcu with 3-0 Vicryl skin edge with Prolene.  Drain was hooked to suction and was placed deeply in the muscular tissue.  All looked quite well.  She had good refill, soft compartments.  Sterile dressing including Mepitel, Xeroform, Adaptic, and gauze were placed and a posterior splint with a gutter support which was placed in standard fashion.  There were no complicating features.  She was taken to recovery room in stable condition.  We will be monitoring her closely. We will begin Xarelto tomorrow.  I would like to wait until then for bleeding issues.  She did quite well.  There were no complicating features.  We will continue Ancef postoperatively.  Her skin is very thin and thus we want to watch her closely.  We will see her back in 2 weeks, remove her bandage, remove her stitches likely no Steri-Strips, but back into at least a well padded splint or cast during that time, as I do not think she is going to be a good candidate for anything aggressive until she is perfectly ready for her skin to be in more stable quiescent state. These notes have been discussed and all questions have been encouraged and answered.     Satira Anis. Amedeo Plenty, M.D.     [facility]  D:  04/03/2014  T:  04/04/2014  Job:  [id number]

## 2014-04-04 NOTE — Progress Notes (Addendum)
Occupational Therapy Re-Evaluation Patient Details Name: Jacqueline [name] MRN: [id number] DOB: Jun 05, 1928 Today's Date: 04/04/2014    History of Present Illness 80 y.o. female admitted to [facility] after fall at home with R displaced humerus fx.  Ortho consulted and surgery scheduled for 04/03/14.  Pt with significant PMHx of dyspnea, anxiety, HTN,A-fib with RVR, OA, degernation of lumbar discs, CVA, and R shoulder arthroscopy.   R OIF R humerus fx 04/03/14.   Clinical Impression   PTA, pt mod I with ADL and had assistance from family for IADL tasks. Pt seen after R humerus ORIF. Pt requires Mod A with mobility, max encouragement for participation and max A for ADL. Reviewed precautions (NWB RUE and use of sling for transfers) with pt and pt's son. Educated on edema control and emphasized importance of keeping RUE/hand elevated and frequent movement of digits to decrease dependent edema. Son verbalized understanding. Pt will need SNF for rehab. Family may want to consider ALF after SNF. Will continue to follow to address established goals.     Follow Up Recommendations  SNF;Supervision/Assistance - 24 hour    Equipment Recommendations  None recommended by OT    Recommendations for Other Services       Precautions / Restrictions Precautions Precautions: Fall Precaution Comments: pt with h/o fall, but this is first fx due to fall Required Braces or Orthoses: Sling Restrictions Weight Bearing Restrictions: Yes RUE Weight Bearing: Non weight bearing      Mobility Bed Mobility   General bed mobility comments: Pt up in chair  Transfers Overall transfer level: Needs assistance Equipment used: 1 person hand held assist Transfers: Sit to/from Stand;Stand Pivot Transfers Sit to Stand: Mod assist          Balance Overall balance assessment: Needs assistance Sitting-balance support: Feet supported;Single extremity supported Sitting balance-Leahy Scale: Fair   Postural control: Posterior  lean Standing balance support: Single extremity supported;During functional activity Standing balance-Leahy Scale: Poor                             ADL   Eating/Feeding: Set up;Sitting   Grooming: Wash/dry face;Wash/dry hands;Minimal assistance;Sitting   Upper Body Bathing: Maximal assistance;Sitting   Lower Body Bathing: Sit to/from stand;Maximal assistance   Upper Body Dressing : Maximal assistance;Sitting   Lower Body Dressing: Sit to/from stand;Maximal assistance   Toilet Transfer: Moderate assistance (attempted toilet transfer, then pt decided not to go )   Toileting- Water quality scientist and Hygiene: Total assistance;Sit to/from stand       Functional mobility during ADLs: Moderate assistance General ADL Comments: Educated pt's son on need to keep RUE supported on pillows and keep hand elevated. Need for frequent ROM of digits     Vision                     Perception     Praxis      Pertinent Vitals/Pain Pain Assessment: Faces Faces Pain Scale: Hurts little more Pain Location: R UE Pain Descriptors / Indicators: Tightness Pain Intervention(s): Limited activity within patient's tolerance;Monitored during session;Repositioned;Ice applied     Hand Dominance Right   Extremity/Trunk Assessment Upper Extremity Assessment Upper Extremity Assessment: RUE deficits/detail RUE Deficits / Details: moderate edema in hand, full AROM of finger, splinted from wrist to shoulder RUE: Unable to fully assess due to immobilization RUE Coordination: decreased fine motor;decreased gross motor LUE Deficits / Details: generalized weakness      Cervical /  Trunk Assessment Cervical / Trunk Assessment: Normal   Communication Communication Communication: No difficulties   Cognition Arousal/Alertness: Awake/alert Behavior During Therapy: Anxious Overall Cognitive Status: Impaired/Different from baseline Area of Impairment:  Attention;Memory;Safety/judgement;Awareness;Problem solving   Current Attention Level: Selective Memory: Decreased short-term memory   Safety/Judgement: Decreased awareness of safety;Decreased awareness of deficits Awareness: Emergent Problem Solving: Slow processing General Comments: Son states his Mom is more confused than normal/baseline   General Comments       Exercises Exercises: Other exercises Other Exercises Other Exercises: R hand digit ROM Other Exercises:  elevation and edema control   Shoulder Instructions      Home Living Family/patient expects to be discharged to:: Skilled nursing facility Living Arrangements: Alone Available Help at Discharge: Family;Available PRN/intermittently Type of Home: House (town home) Home Access: Level entry     Home Layout: One level               Home Equipment: Environmental consultant - 4 wheels;Bedside commode;Shower seat          Prior Functioning/Environment Level of Independence: Independent with assistive device(s)  Gait / Transfers Assistance Needed: per pt she uses the rollator for community ambulation ADL's / Homemaking Assistance Needed: son does all of the housework   Comments: Daughter drives to get her groceries, she heats up food does not prepare.     OT Diagnosis: Generalized weakness;Cognitive deficits;Acute pain   OT Problem List: Decreased strength;Decreased range of motion;Decreased activity tolerance;Impaired balance (sitting and/or standing);Decreased cognition;Decreased safety awareness;Decreased knowledge of precautions;Decreased knowledge of use of DME or AE;Obesity;Impaired UE functional use;Pain;Increased edema   OT Treatment/Interventions: Self-care/ADL training;Therapeutic exercise;Therapeutic activities;Patient/family education;Balance training;DME and/or AE instruction    OT Goals(Current goals can be found in the care plan section) Acute Rehab OT Goals ( Goals remain appropriate) Patient Stated Goal: to  decrease right arm pain OT Goal Formulation: With patient Time For Goal Achievement: 04/18/14 Potential to Achieve Goals: Good  OT Frequency: Min 2X/week   Barriers to D/C: Decreased caregiver support          Co-evaluation              End of Session Nurse Communication: Mobility status  Activity Tolerance: Patient limited by fatigue Patient left: in chair;with call bell/phone within reach;with family/visitor present   Time: 6979-4801 OT Time Calculation (min): 18 min Charges:  OT General Charges $OT Visit: 1 Procedure OT Evaluation $OT Re-eval: 1 Procedure OT Treatments $Self Care/Home Management : 8-22 mins G-Codes:    Gwin Eagon,HILLARY Apr 19, 2014, 11:52 AM   Maurie Boettcher, OTR/L  [phone] 04-19-14

## 2014-04-04 NOTE — Progress Notes (Signed)
Right total shoulder

## 2014-04-04 NOTE — Progress Notes (Signed)
Second IM given 9:00 04/14/14.

## 2014-04-04 NOTE — Progress Notes (Signed)
Subjective: 1 Day Post-Op Procedure(s) (LRB): OPEN REDUCTION INTERNAL FIXATION (ORIF) RIGHT HUMERUS DISTAL FRACTURE (Right) Patient reports pain as controlled at this point. She is not having an increased amount of pain, she denies nausea, vomiting. She denies chest pain or shortness of breath. She states that the left knee is mildly tender. She did tolerate therapy fairly well and has been out of the bed and into a chair today.   Objective: Vital signs in last 24 hours: Temp:  [97.4 F (36.3 C)-98.1 F (36.7 C)] 97.6 F (36.4 C) (12/04 0621) Pulse Rate:  [84-115] 84 (12/04 0621) Resp:  [15-27] 20 (12/04 0621) BP: (73-144)/(14-80) 135/71 mmHg (12/04 0830) SpO2:  [85 %-100 %] 97 % (12/04 0621)  Intake/Output from previous day: 12/03 0701 - 12/04 0700 In: 1100 [I.V.:1100] Out: 320 [Urine:150; Drains:100; Blood:70] Intake/Output this shift:     Recent Labs  04/02/14 0200 04/03/14 0620 04/04/14 0705  HGB 10.0* 9.8* 9.7*    Recent Labs  04/03/14 0620 04/04/14 0705  WBC 6.3 9.4  RBC 3.44* 3.27*  HCT 30.5* 29.2*  PLT 215 237    Recent Labs  04/02/14 0201  NA 140  K 4.7  CL 103  CO2 27  BUN 12  CREATININE 0.77  GLUCOSE 123*  CALCIUM 9.4    Recent Labs  04/03/14 0830  INR 1.05    The patient is alert and oriented, very pleasant and conversant Head is atraumatic normocephalic Chest: Equal expansions are present respirations are nonlabored Abdomen is nontender Right upper extremity: Splint is clean and intact. She has mild-to-moderate edema about the digits and dorsal aspect of her hand, her refill is intact, sensation is intact, digital range of motion is intact. The Hemovac drain is intact however this has been taped and is not charging appropriately, thus it is not pulled as I query whether it is been charged since her surgery. We will continue it for an additional 24 hours in the afternoon mentioned as well as the fact that she will resume her Xarelto  today Left lower extremity: Dressings are removed about the knee, laceration is intact sutures are intact she has peripatellar ecchymosis present no significant effusion straight leg raise is intact without weakness present. New dressings are applied to the knee.  Assessment/Plan: 1 Day Post-Op Procedure(s) (LRB): OPEN REDUCTION INTERNAL FIXATION (ORIF) RIGHT HUMERUS DISTAL FRACTURE (Right) We will continue close observation, pain management and elevation and edema control of the right upper extremity. I have encouraged the patient to make a full fist and extend the digits 20 times during waking hours as well as massage the hand. We will pull the drain tomorrow. He will complete her course of IV antibiotics. We will await discharge disposition given her multiple medical comorbidities and recent upper and lower extremity predicament. All questions were encouraged and answered. Patient Active Problem List   Diagnosis Date Noted  . Displaced fracture of shaft of left humerus 04/03/2014  . Right arm fracture 04/01/2014  . Displaced spiral fracture of shaft of humerus   . Peripheral arterial disease 10/03/2013  . Chronic diastolic CHF (congestive heart failure) 08/08/2013  . Chronic venous insufficiency 04/02/2013  . Hypokalemia 01/13/2013  . PAF (paroxysmal atrial fibrillation) 10/03/2012  . CAD (coronary artery disease) of artery bypass graft 04/05/2012  . Atrial fibrillation with RVR 03/13/2012  . Need for prophylactic vaccination and inoculation against influenza 02/14/2012  . ABDOMINAL WALL HERNIA 01/21/2010  . PARESTHESIA 05/04/2009  . SKIN LESION 01/28/2009  . Nocturia 01/28/2009  .  DYSPNEA 01/01/2009  . CVA (cerebral infarction) 11/17/2008  . ALLERGY 10/27/2008  . PAROXYSMAL NOCTURNAL DYSPNEA 10/03/2008  . OSTEOARTHRITIS, KNEES, BILATERAL 09/03/2008  . RESTRICTIVE LUNG DISEASE 05/26/2008  . UNSPECIFIED ANEMIA 01/08/2008  . UNS ADVRS EFF OTH RX MEDICINAL&BIOLOGICAL SBSTNC 11/09/2007   . HEMATOCHEZIA 07/18/2007  . PARANOIA 06/01/2007  . ANXIETY 01/23/2007  . Essential hypertension 01/23/2007  . GI BLEEDING 01/23/2007  . Osteoarthritis 01/23/2007  . DEGENERATIVE DISC DISEASE, LUMBAR SPINE 01/23/2007  . History of peptic ulcer disease 01/23/2007  . Personal History of Other Diseases of Digestive Disease 01/23/2007    Jacqueline [name] 04/04/2014, 1:38 PM

## 2014-04-04 NOTE — Progress Notes (Signed)
PHARMACY NOTE  Pharmacy Consult :  80 y.o. female is to restart Xarelto post-op as taken Prior to Admission for atrial fibrillation .   Dosing Wt :  66 kg  Hematology :  Recent Labs  04/02/14 0200 04/02/14 0201 04/02/14 1220 04/03/14 0340 04/03/14 0620 04/03/14 0830 04/04/14 0705  HGB 10.0*  --   --   --  9.8*  --  9.7*  HCT 31.0*  --   --   --  30.5*  --  29.2*  PLT 225  --   --   --  215  --  237  APTT 76*  --  77* 41*  --   --  33  LABPROT  --   --   --   --   --  13.8  --   INR  --   --   --   --   --  1.05  --   HEPARINUNFRC 0.98*  --   --  0.32  --   --  <0.10*  CREATININE  --  0.77  --   --   --   --   --     Current Medication[s] Include: Medication PTA: Prescriptions prior to admission  Medication Sig Dispense Refill Last Dose  . ALPRAZolam (XANAX) 0.25 MG tablet Take 0.5 tablets (0.125 mg total) by mouth every 3 (three) hours. (Patient taking differently: Take 0.125-0.25 mg by mouth 4 (four) times daily as needed. ) 180 tablet 0 03/31/2014 at Unknown time  . Cholecalciferol (VITAMIN D3) 5000 UNITS CAPS Take 5,000 Units by mouth daily.   03/31/2014 at Unknown time  . cloNIDine (CATAPRES) 0.1 MG tablet Take 1 tablet (0.1 mg total) by mouth every 2 (two) hours as needed (take as needed for a  Blood pressure that is greater than 160 on top, or bottom number greater than 100.). 30 tablet 3 last year  . DILTIAZEM HCL ER PO Take 180 mg by mouth 2 (two) times daily. Before meals   03/31/2014 at Unknown time  . furosemide (LASIX) 40 MG tablet Take 1 tablet (40 mg total) by mouth daily. (Patient taking differently: Take 40 mg by mouth daily as needed for fluid. ) 30 tablet 0 Past Week at Unknown time  . HYDROcodone-acetaminophen (NORCO/VICODIN) 5-325 MG per tablet Take 1 tablet by mouth 4 (four) times daily. 6 am, NOON, 6pm and MIDNIGHT   03/31/2014 at Unknown time  . ipratropium (ATROVENT) 0.06 % nasal spray Place 2 sprays into the nose 3 (three)  times daily. 45 mL 0 03/31/2014 at Unknown time  . metoprolol succinate (TOPROL-XL) 50 MG 24 hr tablet TAKE 1 TABLET (50 MG TOTAL) BY MOUTH 2 (TWO) TIMES DAILY. TAKE WITH OR IMMEDIATELY FOLLOWING A MEAL. 60 tablet 0 03/31/2014 at 1600  . Multiple Vitamins-Minerals (CENTRUM SILVER PO) Take 1 tablet by mouth daily.   03/31/2014 at Unknown time  . pantoprazole (PROTONIX) 40 MG tablet TAKE 1 TABLET EVERY MORNING 30 tablet 0 03/31/2014 at Unknown time  . potassium chloride SA (K-DUR,KLOR-CON) 20 MEQ tablet TAKE 1 TABLET EVERY DAY 90 tablet 3 03/31/2014 at Unknown time  . traMADol (ULTRAM) 50 MG tablet Take 1 tablet (50 mg total) by mouth every 6 (six) hours as needed. (Patient taking differently: Take 50 mg by mouth every 6 (six) hours as needed for moderate pain. ) 30 tablet 0 unknown  . XARELTO 15 MG TABS tablet TAKE 1 TABLET EVERY DAY 90 tablet 0 03/31/2014 at Unknown time  Scheduled:  Scheduled:  . ALPRAZolam  0.125 mg Oral Q3H while awake  . chlorhexidine  60 mL Topical Once  . cholecalciferol  5,000 Units Oral Daily  . clobetasol cream   Topical BID  . diltiazem  180 mg Oral BID WC  . [START ON 04/05/2014] Influenza vac split quadrivalent PF  0.5 mL Intramuscular Tomorrow-1000  . metoprolol succinate  50 mg Oral Daily  . multivitamin with minerals  1 tablet Oral Daily  . pantoprazole  40 mg Oral Daily  . rivaroxaban  15 mg Oral Q supper  . senna  1 tablet Oral BID   Infusion[s]: Infusions:  . sodium chloride 75 mL/hr at 04/04/14 0031   Antibiotic[s]: Anti-infectives    Start     Dose/Rate Route Frequency Ordered Stop   04/04/14 0600  vancomycin (VANCOCIN) IVPB 1000 mg/200 mL premix  Status:  Discontinued     1,000 mg200 mL/hr over 60 Minutes Intravenous On call to O.R. 04/03/14 2116 04/03/14 2306   04/03/14 2315  vancomycin (VANCOCIN) IVPB 1000 mg/200 mL premix     1,000 mg200 mL/hr over 60 Minutes Intravenous  Once 04/03/14 2302 04/04/14 0132   04/01/14 1800  ceFAZolin (ANCEF) IVPB  1 g/50 mL premix  Status:  Discontinued     1 g100 mL/hr over 30 Minutes Intravenous Every 8 hours 04/01/14 1659 04/03/14 2116      Assessment :  S/p ORIF 12/3.  Patient is to restart Xarelto today   Will restart at previous home dose.  Goal :  Adequate VTE prophylaxis with Xarelto.  Dosed for age, renal function.  Plan : 1. Restart Xarelto 15 mg po q supper.  First dose 04/04/14.  Isac Sarna, BS Pharm D, BCPS Clinical Pharmacist 04/04/2014  10:58 AM

## 2014-04-05 DIAGNOSIS — S42302A Unspecified fracture of shaft of humerus, left arm, initial encounter for closed fracture: Secondary | ICD-10-CM | POA: Diagnosis not present

## 2014-04-05 LAB — CBC
HEMATOCRIT: 27.5 % — AB (ref 36.0–46.0)
HEMOGLOBIN: 8.9 g/dL — AB (ref 12.0–15.0)
MCH: 28.3 pg (ref 26.0–34.0)
MCHC: 32.4 g/dL (ref 30.0–36.0)
MCV: 87.6 fL (ref 78.0–100.0)
Platelets: 244 10*3/uL (ref 150–400)
RBC: 3.14 MIL/uL — AB (ref 3.87–5.11)
RDW: 16.1 % — ABNORMAL HIGH (ref 11.5–15.5)
WBC: 7.4 10*3/uL (ref 4.0–10.5)

## 2014-04-05 LAB — GLUCOSE, CAPILLARY: Glucose-Capillary: 104 mg/dL — ABNORMAL HIGH (ref 70–99)

## 2014-04-05 MED ORDER — WHITE PETROLATUM GEL
Status: AC
  Administered 2014-04-05: 21:00:00
  Filled 2014-04-05: qty 5

## 2014-04-05 NOTE — Progress Notes (Signed)
Orthopedics Progress Note  Subjective: Ms Hopple reports doing well this morning.  My fingers are sausages.  Objective:  Filed Vitals:   04/05/14 0516  BP: 125/52  Pulse: 90  Temp: 98.7 F (37.1 C)  Resp: 18    General: Awake and alert  Musculoskeletal: Right arm immobilized in long arm splint with drain still charged.  Ms Strupp reports that Dr Amedeo Plenty was due to come by to check on the splint and possibly remove the drain.  Left knee laceration healing well, no erythema, min swelling.  Gel dressing applied.  Neurovascularly intact  Lab Results  Component Value Date   WBC 9.4 04/04/2014   HGB 9.7* 04/04/2014   HCT 29.2* 04/04/2014   MCV 89.3 04/04/2014   PLT 237 04/04/2014       Component Value Date/Time   NA 140 04/02/2014 0201   K 4.7 04/02/2014 0201   CL 103 04/02/2014 0201   CO2 27 04/02/2014 0201   GLUCOSE 123* 04/02/2014 0201   BUN 12 04/02/2014 0201   CREATININE 0.77 04/02/2014 0201   CALCIUM 9.4 04/02/2014 0201   GFRNONAA 76* 04/02/2014 0201   GFRAA 88* 04/02/2014 0201    Lab Results  Component Value Date   INR 1.05 04/03/2014   INR 2.48* 04/01/2014   INR 1.4* 07/15/2013    Assessment/Plan: POD #2 s/p Procedure(s): OPEN REDUCTION INTERNAL FIXATION (ORIF) RIGHT HUMERUS DISTAL FRACTURE Patient doing very well after difficult injury and surgery.  Appreciate Dr Amedeo Plenty and his excellent care. The left knee laceration is healing well. No apparent injury to extensor mechanism. Will need SNF rehab. We discussed several options including [facility], [place], and [facility] as those are near family and well regarded.  Doran Heater. Veverly Fells, MD 04/05/2014 9:01 AM

## 2014-04-05 NOTE — Progress Notes (Signed)
Patient ID: Jacqueline [name], female   DOB: 28-Sep-1928, 80 y.o.   MRN: [id number] Patient is doing quite well.  Patient seen at bedside.  Right arm looks excellent she is neurovascularly intact without signs of infection or other problems. Radial median and ulnar nerves all look quite well and are functioning nicely.  I removed her drain today.  Left knee is stable no signs of infection. We will continue dressing changes when necessary.  At present time she looks very stable. I feel that she is ready to transition to an extended care facility.  There've been no complications to date.  We do need for therapy to see her and perform ambulation and other measures to see her stability. I would recommend nonweightbearing to the right upper extremity and assisted device.   All questions were encouraged and answered with family at bedside  I'll plan to see her Monday.  Ayansh Feutz M.D.

## 2014-04-05 NOTE — Progress Notes (Signed)
[facility] PROGRESS NOTE  Jacqueline [name] TIW:[id number] DOB: 1928/07/19 DOA: 03/31/2014 PCP: [name], MD  Assessment/Plan: 80 y.o. female with past medical history of HTN, GERD, A fib, h/o CVAs, CHF who presents with right arm and left knee pain after fall.  1. Fall mechanical with R humeral fracture  -Pt is s/p pod # 2 OPEN REDUCTION INTERNAL FIXATION (ORIF) RIGHT HUMERUS DISTAL FRACTURE (Right) - Ortho on board - Xarelto on board  2. A fib on xarelto at home, BB; patient continues to denie acute cardiopulmonary symptoms';  -echo (2014): LVEF: 55% to 60%. Wall motion was normal -on xarelto for anticoagulation  3. HTN on BB, cardizem, lasix; BP is soft; diuretic on hold periop; close monitor  4. Anemia, no s/s of acute bleeding; continue to monitor 5. H/o Diastolic CHF, clinically euvolemic; lasix on hold periop; resume diuretics post op;    Code Status: full Family Communication: d/w patient, no family at the bedside  Disposition Plan: Pending physical therapy evaluation and recommendations. Most likely snf   Consultants:  ortho  Procedures:  ORIF pend 12/3   Antibiotics:  None  HPI/Subjective: No new complaints. No acute issues reported overnight.  Objective: Filed Vitals:   04/05/14 0516  BP: 125/52  Pulse: 90  Temp: 98.7 F (37.1 C)  Resp: 18    Intake/Output Summary (Last 24 hours) at 04/05/14 1319 Last data filed at 04/05/14 0830  Gross per 24 hour  Intake    720 ml  Output      0 ml  Net    720 ml   Filed Weights   04/02/14 0550 04/03/14 0627 04/05/14 0516  Weight: 65.8 kg (145 lb 1 oz) 65.8 kg (145 lb 1 oz) 67.813 kg (149 lb 8 oz)    Exam:   General:  Alert, oriented , in nad  Cardiovascular: s1,s2 rrr, no rubs  Respiratory: CTA BL, no wheezes  Abdomen: soft, nt,nd   Musculoskeletal: right arm wrapped in ace bandage. No active bleeding  Data Reviewed: Basic Metabolic Panel:  Recent Labs Lab 03/31/14 1959 04/01/14 0400  04/02/14 0201  NA 142 140 140  K 4.6 4.6 4.7  CL 105 103 103  CO2 24 24 27   GLUCOSE 109* 113* 123*  BUN 16 16 12   CREATININE 0.69 0.75 0.77  CALCIUM 9.4 9.6 9.4   Liver Function Tests: No results for input(s): AST, ALT, ALKPHOS, BILITOT, PROT, ALBUMIN in the last 168 hours. No results for input(s): LIPASE, AMYLASE in the last 168 hours. No results for input(s): AMMONIA in the last 168 hours. CBC:  Recent Labs Lab 04/01/14 0400 04/02/14 0200 04/03/14 0620 04/04/14 0705 04/05/14 0758  WBC 8.1 7.1 6.3 9.4 7.4  HGB 10.7* 10.0* 9.8* 9.7* 8.9*  HCT 33.4* 31.0* 30.5* 29.2* 27.5*  MCV 89.5 88.8 88.7 89.3 87.6  PLT 237 225 215 237 244   Cardiac Enzymes: No results for input(s): CKTOTAL, CKMB, CKMBINDEX, TROPONINI in the last 168 hours. BNP (last 3 results) No results for input(s): PROBNP in the last 8760 hours. CBG:  Recent Labs Lab 04/02/14 0635 04/03/14 0629 04/04/14 0636 04/04/14 1246 04/05/14 0638  GLUCAP 98 111* 118* 155* 104*    Recent Results (from the past 240 hour(s))  Surgical pcr screen     Status: Abnormal   Collection Time: 04/03/14  4:21 AM  Result Value Ref Range Status   MRSA, PCR NEGATIVE NEGATIVE Final   Staphylococcus aureus POSITIVE (A) NEGATIVE Final    Comment:  The Xpert SA Assay (FDA approved for NASAL specimens in patients over 44 years of age), is one component of a comprehensive surveillance program.  Test performance has been validated by [employer] for patients greater than or equal to 69 year old. It is not intended to diagnose infection nor to guide or monitor treatment.      Studies: No results found.  Scheduled Meds: . ALPRAZolam  0.125 mg Oral Q3H while awake  . chlorhexidine  60 mL Topical Once  . cholecalciferol  5,000 Units Oral Daily  . clobetasol cream   Topical BID  . diltiazem  180 mg Oral BID WC  . metoprolol succinate  50 mg Oral Daily  . multivitamin with minerals  1 tablet Oral Daily  .  pantoprazole  40 mg Oral Daily  . rivaroxaban  15 mg Oral Q supper  . senna  1 tablet Oral BID   Continuous Infusions: . sodium chloride 75 mL/hr at 04/04/14 0031    Principal Problem:   Right arm fracture Active Problems:   Essential hypertension   CVA (cerebral infarction)   Osteoarthritis   History of peptic ulcer disease   PAF (paroxysmal atrial fibrillation)   Chronic diastolic CHF (congestive heart failure)   Displaced fracture of shaft of left humerus    Time spent: >35 minutes     Velvet Bathe  [facility] Pager [phone] If 7PM-7AM, please contact night-coverage at [email or web address], password [facility] 04/05/2014, 1:19 PM  LOS: 5 days

## 2014-04-06 LAB — CBC
HEMATOCRIT: 27.4 % — AB (ref 36.0–46.0)
HEMOGLOBIN: 9 g/dL — AB (ref 12.0–15.0)
MCH: 29.6 pg (ref 26.0–34.0)
MCHC: 32.8 g/dL (ref 30.0–36.0)
MCV: 90.1 fL (ref 78.0–100.0)
Platelets: 253 10*3/uL (ref 150–400)
RBC: 3.04 MIL/uL — ABNORMAL LOW (ref 3.87–5.11)
RDW: 16.4 % — ABNORMAL HIGH (ref 11.5–15.5)
WBC: 7.1 10*3/uL (ref 4.0–10.5)

## 2014-04-06 LAB — GLUCOSE, CAPILLARY: Glucose-Capillary: 93 mg/dL (ref 70–99)

## 2014-04-06 MED ORDER — POLYETHYLENE GLYCOL 3350 17 G PO PACK
17.0000 g | PACK | Freq: Every day | ORAL | Status: DC
  Administered 2014-04-06 – 2014-04-07 (×2): 17 g via ORAL
  Filled 2014-04-06 (×3): qty 1

## 2014-04-06 NOTE — Progress Notes (Signed)
Orthopedics Progress Note  Subjective: Patient reports constipation, otherwise doing well and looking forward to rehab  Objective:  Filed Vitals:   04/06/14 1044  BP: 122/63  Pulse: 98  Temp:   Resp:     General: Awake and alert  Musculoskeletal: right UE splinted and elevated, fingers swollen.  Left knee incision benign, gel dressing in place Neurovascularly intact  Lab Results  Component Value Date   WBC 7.1 04/06/2014   HGB 9.0* 04/06/2014   HCT 27.4* 04/06/2014   MCV 90.1 04/06/2014   PLT 253 04/06/2014       Component Value Date/Time   NA 140 04/02/2014 0201   K 4.7 04/02/2014 0201   CL 103 04/02/2014 0201   CO2 27 04/02/2014 0201   GLUCOSE 123* 04/02/2014 0201   BUN 12 04/02/2014 0201   CREATININE 0.77 04/02/2014 0201   CALCIUM 9.4 04/02/2014 0201   GFRNONAA 76* 04/02/2014 0201   GFRAA 88* 04/02/2014 0201    Lab Results  Component Value Date   INR 1.05 04/03/2014   INR 2.48* 04/01/2014   INR 1.4* 07/15/2013    Assessment/Plan: POD #3 s/p Procedure(s): OPEN REDUCTION INTERNAL FIXATION (ORIF) RIGHT HUMERUS DISTAL FRACTURE Stable from ortho standpoint, will prescribe some Miralax Placement pending in SNF rehab  Jacqueline [name] R. Veverly Fells, MD 04/06/2014 11:38 AM

## 2014-04-06 NOTE — Progress Notes (Signed)
[facility] PROGRESS NOTE  Prarthana Parlin HGD:[id number] DOB: Apr 01, 1929 DOA: 03/31/2014 PCP: [name], MD  Assessment/Plan: 80 y.o. female with past medical history of HTN, GERD, A fib, h/o CVAs, CHF who presents with right arm and left knee pain after fall.  1. Fall mechanical with R humeral fracture  -Pt is s/p pod # 2 OPEN REDUCTION INTERNAL FIXATION (ORIF) RIGHT HUMERUS DISTAL FRACTURE (Right) - Ortho on board - Xarelto on board  2. A fib on xarelto at home, BB; patient continues to denie acute cardiopulmonary symptoms';  -echo (2014): LVEF: 55% to 60%. Wall motion was normal -on xarelto for anticoagulation  3. HTN on BB, cardizem, lasix; BP is soft; diuretic on hold periop; close monitor  4. Anemia, no s/s of acute bleeding; continue to monitor 5. H/o Diastolic CHF, clinically euvolemic; lasix on hold periop; resume diuretics on discharge   Code Status: full Family Communication: d/w patient, no family at the bedside  Disposition Plan: Awaiting placement into snf most likely 04/07/14   Consultants:  ortho  Procedures:  ORIF pend 12/3   Antibiotics:  None  HPI/Subjective: No new complaints. No acute issues reported overnight.  Objective: Filed Vitals:   04/06/14 1521  BP: 100/50  Pulse: 91  Temp: 98.4 F (36.9 C)  Resp: 16    Intake/Output Summary (Last 24 hours) at 04/06/14 1658 Last data filed at 04/06/14 1230  Gross per 24 hour  Intake    960 ml  Output      0 ml  Net    960 ml   Filed Weights   04/02/14 0550 04/03/14 0627 04/05/14 0516  Weight: 65.8 kg (145 lb 1 oz) 65.8 kg (145 lb 1 oz) 67.813 kg (149 lb 8 oz)    Exam:   General:  Alert, oriented , in nad  Cardiovascular: s1,s2 rrr, no rubs  Respiratory: CTA BL, no wheezes  Abdomen: soft, nt,nd   Musculoskeletal: right arm wrapped in ace bandage. No active bleeding  Data Reviewed: Basic Metabolic Panel:  Recent Labs Lab 03/31/14 1959 04/01/14 0400 04/02/14 0201  NA  142 140 140  K 4.6 4.6 4.7  CL 105 103 103  CO2 24 24 27   GLUCOSE 109* 113* 123*  BUN 16 16 12   CREATININE 0.69 0.75 0.77  CALCIUM 9.4 9.6 9.4   Liver Function Tests: No results for input(s): AST, ALT, ALKPHOS, BILITOT, PROT, ALBUMIN in the last 168 hours. No results for input(s): LIPASE, AMYLASE in the last 168 hours. No results for input(s): AMMONIA in the last 168 hours. CBC:  Recent Labs Lab 04/02/14 0200 04/03/14 0620 04/04/14 0705 04/05/14 0758 04/06/14 0445  WBC 7.1 6.3 9.4 7.4 7.1  HGB 10.0* 9.8* 9.7* 8.9* 9.0*  HCT 31.0* 30.5* 29.2* 27.5* 27.4*  MCV 88.8 88.7 89.3 87.6 90.1  PLT 225 215 237 244 253   Cardiac Enzymes: No results for input(s): CKTOTAL, CKMB, CKMBINDEX, TROPONINI in the last 168 hours. BNP (last 3 results) No results for input(s): PROBNP in the last 8760 hours. CBG:  Recent Labs Lab 04/03/14 0629 04/04/14 0636 04/04/14 1246 04/05/14 0638 04/06/14 0441  GLUCAP 111* 118* 155* 104* 93    Recent Results (from the past 240 hour(s))  Surgical pcr screen     Status: Abnormal   Collection Time: 04/03/14  4:21 AM  Result Value Ref Range Status   MRSA, PCR NEGATIVE NEGATIVE Final   Staphylococcus aureus POSITIVE (A) NEGATIVE Final    Comment:  The Xpert SA Assay (FDA approved for NASAL specimens in patients over 20 years of age), is one component of a comprehensive surveillance program.  Test performance has been validated by [employer] for patients greater than or equal to 80 year old. It is not intended to diagnose infection nor to guide or monitor treatment.      Studies: No results found.  Scheduled Meds: . ALPRAZolam  0.125 mg Oral Q3H while awake  . chlorhexidine  60 mL Topical Once  . cholecalciferol  5,000 Units Oral Daily  . clobetasol cream   Topical BID  . diltiazem  180 mg Oral BID WC  . metoprolol succinate  50 mg Oral Daily  . multivitamin with minerals  1 tablet Oral Daily  . pantoprazole  40 mg Oral Daily   . polyethylene glycol  17 g Oral Daily  . rivaroxaban  15 mg Oral Q supper  . senna  1 tablet Oral BID   Continuous Infusions: . sodium chloride 75 mL/hr at 04/04/14 0031    Principal Problem:   Right arm fracture Active Problems:   Essential hypertension   CVA (cerebral infarction)   Osteoarthritis   History of peptic ulcer disease   PAF (paroxysmal atrial fibrillation)   Chronic diastolic CHF (congestive heart failure)   Displaced fracture of shaft of left humerus    Time spent: >35 minutes     Velvet Bathe  [facility] Pager [phone] If 7PM-7AM, please contact night-coverage at [email or web address], password [facility] 04/06/2014, 4:58 PM  LOS: 6 days

## 2014-04-07 DIAGNOSIS — S42341D Displaced spiral fracture of shaft of humerus, right arm, subsequent encounter for fracture with routine healing: Secondary | ICD-10-CM | POA: Diagnosis not present

## 2014-04-07 DIAGNOSIS — Z9181 History of falling: Secondary | ICD-10-CM | POA: Diagnosis not present

## 2014-04-07 DIAGNOSIS — S42413D Displaced simple supracondylar fracture without intercondylar fracture of unspecified humerus, subsequent encounter for fracture with routine healing: Secondary | ICD-10-CM | POA: Diagnosis not present

## 2014-04-07 DIAGNOSIS — Z4789 Encounter for other orthopedic aftercare: Secondary | ICD-10-CM | POA: Diagnosis not present

## 2014-04-07 DIAGNOSIS — R2681 Unsteadiness on feet: Secondary | ICD-10-CM | POA: Diagnosis not present

## 2014-04-07 DIAGNOSIS — S42401D Unspecified fracture of lower end of right humerus, subsequent encounter for fracture with routine healing: Secondary | ICD-10-CM | POA: Diagnosis not present

## 2014-04-07 DIAGNOSIS — S42302A Unspecified fracture of shaft of humerus, left arm, initial encounter for closed fracture: Secondary | ICD-10-CM | POA: Diagnosis not present

## 2014-04-07 DIAGNOSIS — I2510 Atherosclerotic heart disease of native coronary artery without angina pectoris: Secondary | ICD-10-CM | POA: Diagnosis not present

## 2014-04-07 DIAGNOSIS — I4891 Unspecified atrial fibrillation: Secondary | ICD-10-CM | POA: Diagnosis not present

## 2014-04-07 DIAGNOSIS — I10 Essential (primary) hypertension: Secondary | ICD-10-CM | POA: Diagnosis not present

## 2014-04-07 DIAGNOSIS — F419 Anxiety disorder, unspecified: Secondary | ICD-10-CM | POA: Diagnosis not present

## 2014-04-07 DIAGNOSIS — I5032 Chronic diastolic (congestive) heart failure: Secondary | ICD-10-CM | POA: Diagnosis not present

## 2014-04-07 DIAGNOSIS — Z8711 Personal history of peptic ulcer disease: Secondary | ICD-10-CM | POA: Diagnosis not present

## 2014-04-07 DIAGNOSIS — W06XXXD Fall from bed, subsequent encounter: Secondary | ICD-10-CM | POA: Diagnosis not present

## 2014-04-07 DIAGNOSIS — M79601 Pain in right arm: Secondary | ICD-10-CM | POA: Diagnosis not present

## 2014-04-07 DIAGNOSIS — D6489 Other specified anemias: Secondary | ICD-10-CM | POA: Diagnosis not present

## 2014-04-07 DIAGNOSIS — M6281 Muscle weakness (generalized): Secondary | ICD-10-CM | POA: Diagnosis not present

## 2014-04-07 DIAGNOSIS — I480 Paroxysmal atrial fibrillation: Secondary | ICD-10-CM | POA: Diagnosis not present

## 2014-04-07 DIAGNOSIS — G5602 Carpal tunnel syndrome, left upper limb: Secondary | ICD-10-CM | POA: Diagnosis not present

## 2014-04-07 DIAGNOSIS — S42301A Unspecified fracture of shaft of humerus, right arm, initial encounter for closed fracture: Secondary | ICD-10-CM | POA: Diagnosis not present

## 2014-04-07 LAB — CBC
HEMATOCRIT: 26.8 % — AB (ref 36.0–46.0)
HEMOGLOBIN: 8.4 g/dL — AB (ref 12.0–15.0)
MCH: 27.8 pg (ref 26.0–34.0)
MCHC: 31.3 g/dL (ref 30.0–36.0)
MCV: 88.7 fL (ref 78.0–100.0)
PLATELETS: 276 10*3/uL (ref 150–400)
RBC: 3.02 MIL/uL — ABNORMAL LOW (ref 3.87–5.11)
RDW: 16.2 % — ABNORMAL HIGH (ref 11.5–15.5)
WBC: 5.5 10*3/uL (ref 4.0–10.5)

## 2014-04-07 LAB — GLUCOSE, CAPILLARY: Glucose-Capillary: 86 mg/dL (ref 70–99)

## 2014-04-07 MED ORDER — ALPRAZOLAM 0.25 MG PO TABS: 0.1250 mg | ORAL_TABLET | Freq: Two times a day (BID) | ORAL | Status: DC | PRN

## 2014-04-07 MED ORDER — BISACODYL 10 MG RE SUPP
10.0000 mg | Freq: Once | RECTAL | Status: AC
  Administered 2014-04-07: 10 mg via RECTAL
  Filled 2014-04-07: qty 1

## 2014-04-07 MED ORDER — HYDROCODONE-ACETAMINOPHEN 7.5-325 MG PO TABS: 1.0000 | ORAL_TABLET | Freq: Four times a day (QID) | ORAL | Status: DC | PRN

## 2014-04-07 NOTE — Progress Notes (Signed)
Report given to [facility] facility.  

## 2014-04-07 NOTE — Clinical Social Work Placement (Signed)
Clinical Social Work Department CLINICAL SOCIAL WORK PLACEMENT NOTE 04/07/2014  Patient:  Jacqueline [name], Jacqueline [name]  Account Number:  [id number] Admit date:  03/31/2014  Clinical Social Worker:  Wylene Men  Date/time:  04/03/2014 12:40 PM  Clinical Social Work is seeking post-discharge placement for this patient at the following level of care:   [facility]   (*CSW will update this form in Epic as items are completed)   04/03/2014  Patient/family provided with [place] Department of Clinical Social Work's list of facilities offering this level of care within the geographic area requested by the patient (or if unable, by the patient's family).  04/03/2014  Patient/family informed of their freedom to choose among providers that offer the needed level of care, that participate in Medicare, Medicaid or managed care program needed by the patient, have an available bed and are willing to accept the patient.  04/03/2014  Patient/family informed of [facility]' ownership interest in [facility], as well as of the fact that they are under no obligation to receive care at this facility.  PASARR submitted to EDS on 04/03/2014 PASARR number received on 04/03/2014  FL2 transmitted to all facilities in geographic area requested by pt/family on  04/03/2014 FL2 transmitted to all facilities within larger geographic area on   Patient informed that his/her managed care company has contracts with or will negotiate with  certain facilities, including the following:     Patient/family informed of bed offers received:  04/06/2014 Patient chooses bed at [facility] Physician recommends and patient chooses bed at    Patient to be transferred to [place] on  04/07/2014 Patient to be transferred to facility by [facility] Patient and family notified of transfer on 04/07/2014 Name of family member notified:  [name] daughter  The following physician  request were entered in Epic:   Additional Comments:  Nonnie Done, [place] [phone]  Psychiatric & Orthopedics (5N 1-16) Clinical Social Worker

## 2014-04-07 NOTE — Progress Notes (Signed)
Physical Therapy Treatment Patient Details Name: Jacqueline [name] MRN: [id number] DOB: 04/07/1929 Today's Date: 04/07/2014    History of Present Illness 80 y.o. female admitted to [facility] after fall at home with R displaced humerus fx.  Ortho consulted and surgery scheduled for 04/03/14.  Pt with significant PMHx of dyspnea, anxiety, HTN,A-fib with RVR, OA, degernation of lumbar discs, CVA, and R shoulder arthroscopy.   ORIF R humerus fx 04/03/14.    PT Comments    Pt is slow to progress her mobility due to pain and generalized weakness.  She continues to be appropriate at this time for SNF level rehab.  PT will continue to follow acutely.   Follow Up Recommendations  SNF     Equipment Recommendations  Wheelchair (measurements PT);Wheelchair cushion (measurements PT)    Recommendations for Other Services   NA     Precautions / Restrictions Precautions Precautions: Fall Precaution Comments: pt with h/o fall, but this is first fx due to fall Required Braces or Orthoses: Sling Restrictions Weight Bearing Restrictions: Yes RUE Weight Bearing: Non weight bearing    Mobility  Bed Mobility Overal bed mobility: Needs Assistance Bed Mobility: Sit to Supine       Sit to supine: Mod assist   General bed mobility comments: required RUE management during transition into bed and also assistance to guide B LES into bed  Transfers Overall transfer level: Needs assistance Equipment used:  (steady standing frame) Transfers: Sit to/from [facility] Sit to Stand: Mod assist Stand pivot transfers:  (used steady)       General transfer comment: Pt reporting feeling very weak, so extra precautions taken for her safety.  Mod assist to stand from [facility] to steady standing frame.  Assist to support trunk during transitions.  Verbal cues not to use right arm to help pull. Steady standing frame used for safety as pt did not feel strong enough to stand and transfer too many more times.         Balance           Standing balance support: Single extremity supported Standing balance-Leahy Scale: Poor                      Cognition Arousal/Alertness: Awake/alert Behavior During Therapy: Anxious Overall Cognitive Status: No family/caregiver present to determine baseline cognitive functioning Area of Impairment: Attention;Memory;Safety/judgement;Awareness;Problem solving   Current Attention Level: Selective Memory: Decreased short-term memory   Safety/Judgement: Decreased awareness of safety;Decreased awareness of deficits Awareness: Emergent Problem Solving: Slow processing General Comments: pt. continued to repeat same things over and over.  diffiicult to re-direct    Exercises Other Exercises Other Exercises: R hand digit ROM Other Exercises:  elevation and edema control        Pertinent Vitals/Pain Pain Assessment: Faces Pain Score:  (pt. did not rate) Faces Pain Scale: Hurts a little bit Pain Location: right arm, with mobility Pain Descriptors / Indicators: Aching;Burning Pain Intervention(s): Limited activity within patient's tolerance;Monitored during session;Repositioned;Other (comment) (elevated on pillow)           PT Goals (current goals can now be found in the care plan section) Acute Rehab PT Goals Patient Stated Goal: to decrease right arm pain Progress towards PT goals: Progressing toward goals    Frequency  Min 3X/week    PT Plan Frequency needs to be updated       End of Session Equipment Utilized During Treatment: Other (comment) (steady standing frame) Activity Tolerance: Patient limited by  fatigue;Patient limited by pain Patient left: in chair;with call bell/phone within reach;with chair alarm set     Time: 1131-1151 PT Time Calculation (min) (ACUTE ONLY): 20 min  Charges:  $Therapeutic Activity: 8-22 mins            Dannia Snook B. [place]infall, [place], DPT [phone]   04/07/2014, 12:02 PM

## 2014-04-07 NOTE — Discharge Summary (Signed)
Physician Discharge Summary  Jacqueline [name] PJA:[id number] DOB: 1928-07-27 DOA: 03/31/2014  PCP: Jacqueline Hare, MD  Admit date: 03/31/2014 Discharge date: 04/07/2014  Time spent: > 35 minutes  Recommendations for Outpatient Follow-up:  1. Please be sure patient continues PT at SNF 2. Pt will need to f/u with her orthopaedic surgeons after d/c, please call their office to confirm appointment date  Discharge Diagnoses:  Principal Problem:   Right arm fracture Active Problems:   Essential hypertension   CVA (cerebral infarction)   Osteoarthritis   History of peptic ulcer disease   PAF (paroxysmal atrial fibrillation)   Chronic diastolic CHF (congestive heart failure)   Displaced fracture of shaft of left humerus   Discharge Condition: stable  Diet recommendation: Regular diet  Filed Weights   04/03/14 0627 04/05/14 0516 04/07/14 0530  Weight: 65.8 kg (145 lb 1 oz) 67.813 kg (149 lb 8 oz) 67.722 kg (149 lb 4.8 oz)    History of present illness:  80 y/o female who presented with right arm and left knee pain after fall.  Hospital Course:  80 y.o. female with past medical history of HTN, GERD, A fib, h/o CVAs, CHF who presents with right arm and left knee pain after fall.  1. Fall mechanical with R humeral fracture  -Pt is s/p OPEN REDUCTION INTERNAL FIXATION (ORIF) RIGHT HUMERUS DISTAL FRACTURE (Right) - Ortho on board while patient in house - Left knee was evaluated with 4 view x ray and no acute findings reported. - Pt will need continued PT services after d/c  2. A fib on xarelto at home, BB; patient continues to denie acute cardiopulmonary symptoms';  -echo (2014): LVEF: 55% to 60%. Wall motion was normal -on xarelto for anticoagulation   3. HTN on BB, cardizem, lasix; BP is soft; diuretic on hold periop; close monitor  4. Anemia, no s/s of acute bleeding; continue to monitor 5. H/o Diastolic CHF, clinically euvolemic; lasix on hold periop; resume diuretics on  discharge   Procedures:  As listed above  Consultations:  Orthopaedic surgery  Discharge Exam: Filed Vitals:   04/07/14 0530  BP: 120/49  Pulse: 89  Temp: 97.7 F (36.5 C)  Resp: 16    General: Pt in nad, alert and awake Cardiovascular: No cyanosis, pink extremities Respiratory: equal chest rise, no wheezes  Discharge Instructions You were cared for by a hospitalist during your hospital stay. If you have any questions about your discharge medications or the care you received while you were in the hospital after you are discharged, you can call the unit and asked to speak with the hospitalist on call if the hospitalist that took care of you is not available. Once you are discharged, your primary care physician will handle any further medical issues. Please note that NO REFILLS for any discharge medications will be authorized once you are discharged, as it is imperative that you return to your primary care physician (or establish a relationship with a primary care physician if you do not have one) for your aftercare needs so that they can reassess your need for medications and monitor your lab values.  Discharge Instructions    Call MD for:  redness, tenderness, or signs of infection (pain, swelling, redness, odor or green/yellow discharge around incision site)    Complete by:  As directed      Call MD for:  temperature >100.4    Complete by:  As directed      Diet - low sodium heart healthy  Complete by:  As directed      Discharge instructions    Complete by:  As directed   Please be sure to f/u with your orthopaedic surgeon in 1-2 weeks after discharge. Call their office to confirm your appointment.     Increase activity slowly    Complete by:  As directed           Current Discharge Medication List    CONTINUE these medications which have CHANGED   Details  ALPRAZolam (XANAX) 0.25 MG tablet Take 0.5 tablets (0.125 mg total) by mouth 2 (two) times daily as needed for  anxiety. Qty: 15 tablet, Refills: 0    HYDROcodone-acetaminophen (NORCO) 7.5-325 MG per tablet Take 1 tablet by mouth every 6 (six) hours as needed for moderate pain. Qty: 30 tablet, Refills: 0      CONTINUE these medications which have NOT CHANGED   Details  Cholecalciferol (VITAMIN D3) 5000 UNITS CAPS Take 5,000 Units by mouth daily.    cloNIDine (CATAPRES) 0.1 MG tablet Take 1 tablet (0.1 mg total) by mouth every 2 (two) hours as needed (take as needed for a  Blood pressure that is greater than 160 on top, or bottom number greater than 100.). Qty: 30 tablet, Refills: 3    DILTIAZEM HCL ER PO Take 180 mg by mouth 2 (two) times daily. Before meals    furosemide (LASIX) 40 MG tablet Take 1 tablet (40 mg total) by mouth daily. Qty: 30 tablet, Refills: 0    metoprolol succinate (TOPROL-XL) 50 MG 24 hr tablet TAKE 1 TABLET (50 MG TOTAL) BY MOUTH 2 (TWO) TIMES DAILY. TAKE WITH OR IMMEDIATELY FOLLOWING A MEAL. Qty: 60 tablet, Refills: 0    Multiple Vitamins-Minerals (CENTRUM SILVER PO) Take 1 tablet by mouth daily.    pantoprazole (PROTONIX) 40 MG tablet TAKE 1 TABLET EVERY MORNING Qty: 30 tablet, Refills: 0    potassium chloride SA (K-DUR,KLOR-CON) 20 MEQ tablet TAKE 1 TABLET EVERY DAY Qty: 90 tablet, Refills: 3    XARELTO 15 MG TABS tablet TAKE 1 TABLET EVERY DAY Qty: 90 tablet, Refills: 0      STOP taking these medications     HYDROcodone-acetaminophen (NORCO/VICODIN) 5-325 MG per tablet      ipratropium (ATROVENT) 0.06 % nasal spray      traMADol (ULTRAM) 50 MG tablet      clobetasol cream (TEMOVATE) 0.05 %        Allergies  Allergen Reactions  . Metoclopramide Hcl Other (See Comments)     Tremors  . Penicillins Itching  . Aspirin Other (See Comments)    "gave me stomach aches; made me bleed" (03/13/12)  . Zetia [Ezetimibe] Other (See Comments)    Stomach and back pain      The results of significant diagnostics from this hospitalization (including imaging,  microbiology, ancillary and laboratory) are listed below for reference.    Significant Diagnostic Studies: Dg Shoulder Right  03/31/2014   CLINICAL DATA:  Status post fall at home today, with acute onset of right shoulder pain. Initial encounter.  EXAM: RIGHT SHOULDER - 1 VIEW  COMPARISON:  None.  FINDINGS: Only a single view could be obtained due to limitations in patient positioning.  There is no definite evidence of fracture or dislocation.  Degenerative change is noted at the right glenohumeral joint, with joint space narrowing and sclerotic change. There is mild superior subluxation of the right humeral head. The right acromioclavicular joint is grossly unremarkable in appearance.  No significant  soft tissue abnormalities are seen. The visualized portions of the lungs are clear.  IMPRESSION: No definite evidence of fracture or dislocation at the right glenohumeral joint. Degenerative change noted at the right glenohumeral joint.   Electronically Signed   By: Garald Balding M.D.   On: 03/31/2014 21:26   Ct Head Wo Contrast  03/31/2014   CLINICAL DATA:  Acute onset of dizziness. Fell forward while sitting on edge of bed. Headache. No loss of consciousness. Initial encounter.  EXAM: CT HEAD WITHOUT CONTRAST  CT CERVICAL SPINE WITHOUT CONTRAST  TECHNIQUE: Multidetector CT imaging of the head and cervical spine was performed following the standard protocol without intravenous contrast. Multiplanar CT image reconstructions of the cervical spine were also generated.  COMPARISON:  None.  FINDINGS: CT HEAD FINDINGS  There is no evidence of acute infarction, mass lesion, or intra- or extra-axial hemorrhage on CT.  Prominence of the ventricles and sulci reflects moderate cortical volume loss. Mild periventricular and subcortical white matter change likely reflects small vessel ischemic microangiopathy. Mild cerebellar atrophy is noted. A small chronic lacunar infarct is noted at the left cerebellar hemisphere.   The brainstem and fourth ventricle are within normal limits. The basal ganglia are unremarkable in appearance. The cerebral hemispheres demonstrate grossly normal gray-white differentiation. No mass effect or midline shift is seen.  There is no evidence of fracture; visualized osseous structures are unremarkable in appearance. The orbits are within normal limits. The paranasal sinuses and mastoid air cells are well-aerated. Cerumen is noted filling the right external auditory canal.  CT CERVICAL SPINE FINDINGS  There is no evidence of acute fracture or subluxation. There is grade 1 anterolisthesis of C4 on C5, and grade 1 anterolisthesis of C7 on T1. Multilevel disc space narrowing is noted along the cervical and upper thoracic spine, with scattered anterior and posterior disc osteophyte complexes. Underlying facet disease is noted. Prevertebral soft tissues are within normal limits.  The thyroid gland is unremarkable in appearance. The visualized lung apices are clear. Diffuse calcification is seen along the aortic arch and great vessels. Retropharyngeal common carotid arteries are noted. Calcification is seen at the carotid bifurcations bilaterally.  IMPRESSION: 1. No evidence of traumatic intracranial injury or fracture. 2. No evidence of acute fracture or subluxation along the cervical spine. 3. Moderate cortical volume loss and scattered small vessel ischemic microangiopathy. Small chronic lacunar infarct at the left cerebellar hemisphere. 4. Degenerative change noted along the cervical and upper thoracic spine. 5. Diffuse calcification along the aortic arch and great vessels. Calcification noted at the carotid bifurcations bilaterally. Carotid ultrasound would be helpful for further evaluation, when and as deemed clinically appropriate. 6. Cerumen noted filling the right external auditory canal.   Electronically Signed   By: Garald Balding M.D.   On: 03/31/2014 22:37   Ct Cervical Spine Wo  Contrast  03/31/2014   CLINICAL DATA:  Acute onset of dizziness. Fell forward while sitting on edge of bed. Headache. No loss of consciousness. Initial encounter.  EXAM: CT HEAD WITHOUT CONTRAST  CT CERVICAL SPINE WITHOUT CONTRAST  TECHNIQUE: Multidetector CT imaging of the head and cervical spine was performed following the standard protocol without intravenous contrast. Multiplanar CT image reconstructions of the cervical spine were also generated.  COMPARISON:  None.  FINDINGS: CT HEAD FINDINGS  There is no evidence of acute infarction, mass lesion, or intra- or extra-axial hemorrhage on CT.  Prominence of the ventricles and sulci reflects moderate cortical volume loss. Mild periventricular and subcortical white  matter change likely reflects small vessel ischemic microangiopathy. Mild cerebellar atrophy is noted. A small chronic lacunar infarct is noted at the left cerebellar hemisphere.  The brainstem and fourth ventricle are within normal limits. The basal ganglia are unremarkable in appearance. The cerebral hemispheres demonstrate grossly normal gray-white differentiation. No mass effect or midline shift is seen.  There is no evidence of fracture; visualized osseous structures are unremarkable in appearance. The orbits are within normal limits. The paranasal sinuses and mastoid air cells are well-aerated. Cerumen is noted filling the right external auditory canal.  CT CERVICAL SPINE FINDINGS  There is no evidence of acute fracture or subluxation. There is grade 1 anterolisthesis of C4 on C5, and grade 1 anterolisthesis of C7 on T1. Multilevel disc space narrowing is noted along the cervical and upper thoracic spine, with scattered anterior and posterior disc osteophyte complexes. Underlying facet disease is noted. Prevertebral soft tissues are within normal limits.  The thyroid gland is unremarkable in appearance. The visualized lung apices are clear. Diffuse calcification is seen along the aortic arch and  great vessels. Retropharyngeal common carotid arteries are noted. Calcification is seen at the carotid bifurcations bilaterally.  IMPRESSION: 1. No evidence of traumatic intracranial injury or fracture. 2. No evidence of acute fracture or subluxation along the cervical spine. 3. Moderate cortical volume loss and scattered small vessel ischemic microangiopathy. Small chronic lacunar infarct at the left cerebellar hemisphere. 4. Degenerative change noted along the cervical and upper thoracic spine. 5. Diffuse calcification along the aortic arch and great vessels. Calcification noted at the carotid bifurcations bilaterally. Carotid ultrasound would be helpful for further evaluation, when and as deemed clinically appropriate. 6. Cerumen noted filling the right external auditory canal.   Electronically Signed   By: Garald Balding M.D.   On: 03/31/2014 22:37   Ct Humerus Right Wo Contrast  04/01/2014   CLINICAL DATA:  Distal humeral fracture  EXAM: CT OF THE RIGHT HUMERUS WITHOUT CONTRAST  TECHNIQUE: Multidetector CT imaging was performed according to the standard protocol. Multiplanar CT image reconstructions were also generated.  COMPARISON:  03/31/2014  FINDINGS: Severe glenohumeral degenerative arthropathy with marked loss of articular space and bone-on-bone appearance, with prominent spurring. Glenohumeral joint effusion observed. Oblique fracture of the distal humeral shaft with 22 degrees of apex posterior angulation, 1.6 cm of overlap, 6 mm of lateral displacement of the distal fragment, and 6 mm anterior displacement of the distal fragment.  IMPRESSION: 1. Oblique fracture of the distal humerus with mild overlap, mild displacement, and 22 degrees of apex posterior angulation. 2. Severe degenerative glenohumeral arthropathy with glenohumeral joint effusion.   Electronically Signed   By: Sherryl Barters M.D.   On: 04/01/2014 10:35   Ct 3d Recon At Scanner  04/01/2014   CLINICAL DATA:  Nonspecific (abnormal)  findings on radiological and other examination of musculoskeletal sysem. Distal humeral fracture.  EXAM: 3-DIMENSIONAL CT IMAGE RENDERING ON ACQUISITION WORKSTATION  TECHNIQUE: 3-dimensional CT images were rendered by post-processing of the original CT data on an acquisition workstation. The 3-dimensional CT images were interpreted and findings were reported in the accompanying complete CT report for this study  COMPARISON:  04/01/2014 ; 03/31/2014  FINDINGS: Please see accession [id number] for description of findings.  IMPRESSION: Please see accession [id number] for description of impressions.   Electronically Signed   By: Sherryl Barters M.D.   On: 04/01/2014 10:48   Dg Knee Complete 4 Views Left  04/01/2014   CLINICAL DATA:  Fall, patellar laceration, acute  pain  EXAM: LEFT KNEE - COMPLETE 4+ VIEW  COMPARISON:  None.  FINDINGS: Bones are osteopenic. Normal alignment without fracture or significant effusion. Relatively preserved joint spaces. Peripheral calcific atherosclerosis present.  IMPRESSION: No acute finding.   Electronically Signed   By: Daryll Brod M.D.   On: 04/01/2014 20:47   Dg Humerus Right  03/31/2014   CLINICAL DATA:  Fall with right arm pain.  Initial encounter.  EXAM: RIGHT HUMERUS - 2+ VIEW  COMPARISON:  None.  FINDINGS: A spiral type fracture of the distal humerus is noted with 1.2 cm lateral and anterior displacement.  There is no evidence of subluxation or dislocation.  Severe degenerative changes at the glenohumeral joint noted.  IMPRESSION: Distal humeral spiral fracture with 1.2 cm lateral and anterior displacement.   Electronically Signed   By: Hassan Rowan M.D.   On: 03/31/2014 21:26    Microbiology: Recent Results (from the past 240 hour(s))  Surgical pcr screen     Status: Abnormal   Collection Time: 04/03/14  4:21 AM  Result Value Ref Range Status   MRSA, PCR NEGATIVE NEGATIVE Final   Staphylococcus aureus POSITIVE (A) NEGATIVE Final    Comment:        The Xpert SA Assay  (FDA approved for NASAL specimens in patients over 42 years of age), is one component of a comprehensive surveillance program.  Test performance has been validated by [employer] for patients greater than or equal to 48 year old. It is not intended to diagnose infection nor to guide or monitor treatment.      Labs: Basic Metabolic Panel:  Recent Labs Lab 03/31/14 1959 04/01/14 0400 04/02/14 0201  NA 142 140 140  K 4.6 4.6 4.7  CL 105 103 103  CO2 24 24 27   GLUCOSE 109* 113* 123*  BUN 16 16 12   CREATININE 0.69 0.75 0.77  CALCIUM 9.4 9.6 9.4   Liver Function Tests: No results for input(s): AST, ALT, ALKPHOS, BILITOT, PROT, ALBUMIN in the last 168 hours. No results for input(s): LIPASE, AMYLASE in the last 168 hours. No results for input(s): AMMONIA in the last 168 hours. CBC:  Recent Labs Lab 04/03/14 0620 04/04/14 0705 04/05/14 0758 04/06/14 0445 04/07/14 0653  WBC 6.3 9.4 7.4 7.1 5.5  HGB 9.8* 9.7* 8.9* 9.0* 8.4*  HCT 30.5* 29.2* 27.5* 27.4* 26.8*  MCV 88.7 89.3 87.6 90.1 88.7  PLT 215 237 244 253 276   Cardiac Enzymes: No results for input(s): CKTOTAL, CKMB, CKMBINDEX, TROPONINI in the last 168 hours. BNP: BNP (last 3 results) No results for input(s): PROBNP in the last 8760 hours. CBG:  Recent Labs Lab 04/04/14 0636 04/04/14 1246 04/05/14 0638 04/06/14 0441 04/07/14 0623  GLUCAP 118* 155* 104* 93 86       Signed:  Velvet Bathe  [facility] 04/07/2014, 12:30 PM

## 2014-04-07 NOTE — Clinical Social Work Note (Addendum)
Patient to discharge today to [facility] (private room). RN to call report prior to transportation: [phone] Transportation: [facility] (Scheduled for 3pm per RN)  CSW reviewed discharge plans with daughter and patient.  Both were agreeable.  DC summary sent to SNF for review.  Nonnie Done, [place] [phone]  Psychiatric & Orthopedics (5N 1-16) Clinical Social Worker

## 2014-04-07 NOTE — Progress Notes (Signed)
Occupational Therapy Treatment Patient Details Name: Jacqueline [name] MRN: [id number] DOB: May 20, 1928 Today's Date: 04/07/2014    History of present illness 80 y.o. female admitted to [facility] after fall at home with R displaced humerus fx.  Ortho consulted and surgery scheduled for 04/03/14.  Pt with significant PMHx of dyspnea, anxiety, HTN,A-fib with RVR, OA, degernation of lumbar discs, CVA, and R shoulder arthroscopy.   ROIF R humerus fx 04/03/14.   OT comments  Pt. Tolerated gentle ROM to R digits and positioning of R UE to promote elevation for continued edema control and management.    Follow Up Recommendations  SNF;Supervision/Assistance - 24 hour    Equipment Recommendations  None recommended by OT    Recommendations for Other Services      Precautions / Restrictions Precautions Precautions: Fall Precaution Comments: pt with h/o fall, but this is first fx due to fall Required Braces or Orthoses: Sling Restrictions Weight Bearing Restrictions: Yes RUE Weight Bearing: Non weight bearing       Mobility Bed Mobility Overal bed mobility: Needs Assistance Bed Mobility: Sit to Supine       Sit to supine: Mod assist   General bed mobility comments: required RUE management during transition into bed and also assistance to guide B LES into bed  Transfers                 General transfer comment: pt. declined transfer due to fatigue    Balance                                   ADL Overall ADL's : Needs assistance/impaired Eating/Feeding: Set up;Sitting                                     General ADL Comments: declined OOB due to fatigue from [facility] prior to arrival, requested back to bed to 'rest"      Vision                     Perception     Praxis      Cognition   Behavior During Therapy: Anxious   Area of Impairment: Attention;Memory;Safety/judgement;Awareness;Problem solving   Current Attention Level:  Selective Memory: Decreased short-term memory    Safety/Judgement: Decreased awareness of safety;Decreased awareness of deficits Awareness: Emergent Problem Solving: Slow processing General Comments: pt. continued to repeat same things over and over.  diffiicult to re-direct    Extremity/Trunk Assessment               Exercises Other Exercises Other Exercises: R hand digit ROM Other Exercises:  elevation and edema control   Shoulder Instructions       General Comments      Pertinent Vitals/ Pain       Pain Score:  (pt. did not rate) Pain Location: R UE Pain Descriptors / Indicators: Tightness;Heaviness Pain Intervention(s): Monitored during session;Repositioned  Home Living                                          Prior Functioning/Environment              Frequency Min 2X/week     Progress Toward Goals  OT Goals(current goals can now  be found in the care plan section)  Progress towards OT goals: Progressing toward goals     Plan Discharge plan remains appropriate    Co-evaluation                 End of Session     Activity Tolerance Patient limited by pain   Patient Left in bed;with call bell/phone within reach (stated multiple times that her anxiety is triggered by not being able to reach call bell)   Nurse Communication          Time: 0315-9458 OT Time Calculation (min): 17 min  Charges: OT General Charges $OT Visit: 1 Procedure OT Treatments $Therapeutic Exercise: 8-22 mins  Janice Coffin, COTA/L 04/07/2014, 10:35 AM

## 2014-04-08 ENCOUNTER — Other Ambulatory Visit: Payer: Self-pay | Admitting: *Deleted

## 2014-04-08 MED ORDER — HYDROCODONE-ACETAMINOPHEN 7.5-325 MG PO TABS: ORAL_TABLET | ORAL | Status: DC

## 2014-04-08 MED ORDER — ALPRAZOLAM 0.25 MG PO TABS: ORAL_TABLET | ORAL | Status: DC

## 2014-04-08 NOTE — Telephone Encounter (Signed)
[facility] 

## 2014-04-11 NOTE — Addendum Note (Signed)
Addendum  created 04/11/14 1623 by Napoleon Form, MD   Modules edited: Anesthesia Responsible Staff

## 2014-04-14 ENCOUNTER — Other Ambulatory Visit: Payer: Self-pay | Admitting: *Deleted

## 2014-04-14 ENCOUNTER — Non-Acute Institutional Stay (SKILLED_NURSING_FACILITY): Payer: Medicare Other | Admitting: Internal Medicine

## 2014-04-14 DIAGNOSIS — S42302A Unspecified fracture of shaft of humerus, left arm, initial encounter for closed fracture: Secondary | ICD-10-CM

## 2014-04-14 DIAGNOSIS — I4891 Unspecified atrial fibrillation: Secondary | ICD-10-CM | POA: Diagnosis not present

## 2014-04-14 DIAGNOSIS — I5032 Chronic diastolic (congestive) heart failure: Secondary | ICD-10-CM | POA: Diagnosis not present

## 2014-04-14 DIAGNOSIS — I10 Essential (primary) hypertension: Secondary | ICD-10-CM

## 2014-04-14 MED ORDER — ALPRAZOLAM 0.25 MG PO TABS: ORAL_TABLET | ORAL | Status: DC

## 2014-04-14 NOTE — Progress Notes (Signed)
Patient ID: Jacqueline [name], female   DOB: 12-04-1928, 80 y.o.   MRN: [id number]   This is an acute visit.  Level care skilled.  [facility].  Chief complaint-acute visit status post hospitalization for right humerus fracture-.  History of present illness.  Patient is a pleasant 80 year old female with a history of hypertension atrial fibrillation history CVA and CHF who presented with right arm and left knee pain after falling.  She was found to have a right humeral fracture and subsequently underwent open reduction internal fixation of a distal fracture of the humerus.  Her x-ray was evaluated but found to have no acute findings-she is here for physical therapy.  She also has a previous history of atrial fibrillation continues on Xarelto as well as a beta blocker this was stable apparently during her hospitalization  Her blood pressure also appears to be stable recent recent readings 131/71-121/74 she is on a beta blocker Cardizem as well as Lasix-her blood pressure was low in the hospital her diuretic was put on hold.  She has a history of diastolic CHF-her Lasix has been restarted upon hospital discharge this appears to be stable she does not complain of any increased shortness of breath appears to have minimal edema.  Previous medical history.  Right humerus fracture status post ORIF.  Hypertension.  History CVA.  Osteoarthritis.  Peptic ulcer disease.  Atrial fibrillation.  Chronic diastolic CHF.  Family medical history is been reviewed per discharge summary on 04/07/2014.  Medications.  Xanax 0.125 mg twice a day when necessary.  Vicodin 7.5-325 mg one tablet every 6 hours when necessary.  Vitamin D3-5000 units daily.  Clonidine 0.1 mg every 2 hours for blood pressure rate are than systolic of 696 or diastolic greater than 789.  Diltiazem 180 mg twice a day.  Lasix 40 mg daily.  Toprol-XL 50 mg twice a day.  Multivitamin daily.  Protonix 40 mg  daily.  Potassium 20 mEq daily.  Xarelto-50 mg by mouth daily  Review of systems.  In general no complaints of fever or chills.  Skin does not complain of any rashes or itching.  Head ears eyes nose mouth and throat-does not complain of visual changes nasal discharge or sore throat.  Respiratory no shortness of breath or cough.  Cardiac does not complain of chest pain does not really have significant lower extremity edema.  GI does not complain of abdominal discomfort nausea vomiting diarrhea or constipation.  Musculoskeletal-has a soft cast of her right upper arm does not really complain of significant pain however with a history of a humerus fracture-neurologic does not complain of dizziness headache or numbness.  Psych does have some history of anxiety this appears to be well controlled she does get when necessary Xanax.  Physical exam.  Temperature is 97.9 pulse 94 respirations 20 blood pressure 131/71.  In general this is a pleasant elderly female in no distress resting comfortably in bed.  Her skin is warm and dry she does have a small dressing over her left knee apparently this is an abrasion that is quite benign appearing.  Eyes pupils appear reactive light sclera and conjunctiva are clear visual acuity appears grossly intact.  Oropharynx clear mucous membranes moist.  Chest is clear to auscultation there is no labored breathing.  Heart is regular rate and rhythm without murmur gallop or she does not have significant lower extremity edema-she does have somewhat reduced pedal pulses bilaterally area  Abdomen soft nontender positive bowel sounds.  Musculoskeletal does  hav casting of her right arm she does have capillary refill of her fingers they'll warm to touch slightly edematous otherwise moves all extremities at baseline no deformities noted.  Neurologic appears grossly intact speech is clear I do not see lateralizing findings.  Psych she is alert and oriented  pleasant.  Labs.  04/10/2014.  WBC 6.5 hemoglobin 8.3 platelets 338.  Sodium 139 potassium 3.6 BUN 11 creatinine 0.6.  Liver function tests within normal limits except albumin of 2.9.  Assessment and plan.  #1-history of right humerus fracture-this is followed by orthopedic status post ORIF she does have lichen in for pain apparently this is effective-will need continued PT and OT.  #2 history of atrial fibrillation this appears rate controlled on beta blocker she is onXarelto for anticoagulation  #3-hypertension this appears to be well controlled on numerous agents including Toprol Cardizem and Lasix blood pressures appear to be stable despite history of low blood pressures in the hospital recent blood pressures 131/71-121/74-130/78.  #1-LXBWIOM of diastolic CHF-clinically this appears stable I do not really see significant edema she has been restarted on Lasix blood pressures appear to be stable despite being on the Lasix-continue to monitor weights notify provider of gain greater than 3 pounds.  #5-history of anemia I suspect this is postop-will update a CBC.   6 anxiety she is on Xanax low-dose she gets this frequently apparent she has been on this for years and it has been effective  Also since patient is on diuretic we'll update a metabolic panel to ensure stability of renal function and electrolytes.  [id number] note greater than 35 minutes spent assessing patient-reviewing her medical records-and coordinating and formulating a plan of care for numerous diagnoses-of note greater than 50% of time spent coordinating plan of care

## 2014-04-14 NOTE — Telephone Encounter (Signed)
[facility] 

## 2014-04-16 ENCOUNTER — Encounter: Payer: Self-pay | Admitting: Internal Medicine

## 2014-04-16 ENCOUNTER — Non-Acute Institutional Stay (SKILLED_NURSING_FACILITY): Payer: Medicare Other | Admitting: Internal Medicine

## 2014-04-16 DIAGNOSIS — I10 Essential (primary) hypertension: Secondary | ICD-10-CM

## 2014-04-16 DIAGNOSIS — S42341D Displaced spiral fracture of shaft of humerus, right arm, subsequent encounter for fracture with routine healing: Secondary | ICD-10-CM | POA: Diagnosis not present

## 2014-04-16 DIAGNOSIS — S42413D Displaced simple supracondylar fracture without intercondylar fracture of unspecified humerus, subsequent encounter for fracture with routine healing: Secondary | ICD-10-CM | POA: Diagnosis not present

## 2014-04-16 DIAGNOSIS — D6489 Other specified anemias: Secondary | ICD-10-CM

## 2014-04-16 DIAGNOSIS — I480 Paroxysmal atrial fibrillation: Secondary | ICD-10-CM

## 2014-04-16 DIAGNOSIS — I5032 Chronic diastolic (congestive) heart failure: Secondary | ICD-10-CM | POA: Diagnosis not present

## 2014-04-16 DIAGNOSIS — Z4789 Encounter for other orthopedic aftercare: Secondary | ICD-10-CM | POA: Diagnosis not present

## 2014-04-16 NOTE — Assessment & Plan Note (Signed)
no s/s of acute bleeding; Hb 8.4, no need for tx

## 2014-04-16 NOTE — Assessment & Plan Note (Signed)
on xarelto at home, BB; patient continues to denie acute cardiopulmonary symptoms';  -echo (2014): LVEF: 55% to 60%. Wall motion was normal -on xarelto for anticoagulation

## 2014-04-16 NOTE — Assessment & Plan Note (Signed)
clinically euvolemic; lasix held periop; resumed diuretics on discharge

## 2014-04-16 NOTE — Progress Notes (Signed)
MRN: [id number] Name: Jacqueline [name]  Sex: female Age: 80 y.o. DOB: Sep 25, 1928  [place] #: [facility] Facility/Room: 102 Level Of Care: SNF Provider: Inocencio Homes D Emergency Contacts: Extended Emergency Contact Information Primary Emergency Contact: Marlowe Alt of [place] Mobile Phone: [phone] Relation: Son Secondary Emergency Contact: Bryand,Flake Address: [address]          [place], [place] Mobile Phone: [phone] Relation: Son     Allergies: Metoclopramide hcl; Penicillins; Aspirin; and Zetia  Chief Complaint  Patient presents with  . New Admit To SNF    HPI: Patient is 80 y.o. female who fell at home with resultant humerus fx admitted to SNF for OT/PT.  Past Medical History  Diagnosis Date  . Allergy, unspecified not elsewhere classified   . Other dyspnea and respiratory abnormality   . Other diseases of lung, not elsewhere classified   . Anemia, unspecified   . Unspecified adverse effect of other drug, medicinal and biological substance(995.29)   . Blood in stool   . Hemorrhage of gastrointestinal tract, unspecified   . Personal history of peptic ulcer disease   . Anxiety state, unspecified   . Personal history of other diseases of digestive system   . Unspecified essential hypertension   . Complication of anesthesia     "hard to wake me up after colonoscopy" (03/13/12)  . H/O hiatal hernia   . GERD (gastroesophageal reflux disease)   . Unspecified paranoid state   . Atrial fibrillation with rapid ventricular response 03/13/12  . Osteoarthrosis, unspecified whether generalized or localized, lower leg   . Degeneration of lumbar or lumbosacral intervertebral disc   . Unspecified cerebral artery occlusion with cerebral infarction 10/2008    "mini stroke" denies residual (03/13/12)  . History of IBS     Past Surgical History  Procedure Laterality Date  . Pilonidal cyst / sinus excision  1957  . Dilation and curettage  of uterus  1950's?  . Shoulder arthroscopy  ~ 2000    right  . Orif humerus fracture Right 04/03/2014    Procedure: OPEN REDUCTION INTERNAL FIXATION (ORIF) RIGHT HUMERUS DISTAL FRACTURE;  Surgeon: Roseanne Kaufman, MD;  Location: [facility];  Service: Orthopedics;  Laterality: Right;      Medication List       This list is accurate as of: 04/16/14  7:30 PM.  Always use your most recent med list.               ALPRAZolam 0.25 MG tablet  Commonly known as:  XANAX  Take 1/2 tablet by mouth every 3 hours as needed for anxiety     CENTRUM SILVER PO  Take 1 tablet by mouth daily.     cloNIDine 0.1 MG tablet  Commonly known as:  CATAPRES  Take 1 tablet (0.1 mg total) by mouth every 2 (two) hours as needed (take as needed for a  Blood pressure that is greater than 160 on top, or bottom number greater than 100.).     DILTIAZEM HCL ER PO  Take 180 mg by mouth 2 (two) times daily. Before meals     furosemide 40 MG tablet  Commonly known as:  LASIX  Take 1 tablet (40 mg total) by mouth daily.     HYDROcodone-acetaminophen 7.5-325 MG per tablet  Commonly known as:  NORCO  Take one tablet by mouth every 6 hours as needed for moderate pain     metoprolol succinate 50 MG 24 hr tablet  Commonly known as:  TOPROL-XL  TAKE 1 TABLET (50 MG TOTAL) BY MOUTH 2 (TWO) TIMES DAILY. TAKE WITH OR IMMEDIATELY FOLLOWING A MEAL.     pantoprazole 40 MG tablet  Commonly known as:  PROTONIX  TAKE 1 TABLET EVERY MORNING     potassium chloride SA 20 MEQ tablet  Commonly known as:  K-DUR,KLOR-CON  TAKE 1 TABLET EVERY DAY     Vitamin D3 5000 UNITS Caps  Take 5,000 Units by mouth daily.     XARELTO 15 MG Tabs tablet  Generic drug:  Rivaroxaban  TAKE 1 TABLET EVERY DAY        No orders of the defined types were placed in this encounter.    Immunization History  Administered Date(s) Administered  . Influenza Split 01/24/2011, 02/14/2012  . Influenza Whole 01/28/2009  . Influenza,inj,Quad PF,36+  Mos 01/14/2013, 04/05/2014  . Pneumococcal Polysaccharide-23 01/28/2009  . Tdap 03/31/2014  . Zoster 01/24/2011    History  Substance Use Topics  . Smoking status: Former Smoker -- 1.00 packs/day for 40 years    Types: Cigarettes    Quit date: 05/02/2002  . Smokeless tobacco: Never Used  . Alcohol Use: No    Family history is noncontributory    Review of Systems  DATA OBTAINED: from patient,  family member GENERAL:  no fevers, fatigue, appetite changes SKIN: No itching, rash; wounds L lnee, R arm-stiches just removed EYES: No eye pain, redness, discharge EARS: No earache, tinnitus, change in hearing NOSE: No congestion, drainage or bleeding  MOUTH/THROAT: No mouth or tooth pain, No sore throat RESPIRATORY: No cough, wheezing, SOB CARDIAC: No chest pain, palpitations, lower extremity edema  GI: No abdominal pain, No N/V/D or constipation, No heartburn or reflux  GU: No dysuria, frequency or urgency, or incontinence  MUSCULOSKELETAL: No unrelieved bone/joint pain NEUROLOGIC: No headache, dizziness or focal weakness PSYCHIATRIC: No overt anxiety or sadness, No behavior issue.   Filed Vitals:   04/16/14 1749  BP: 94/53  Pulse: 84  Temp: 97.2 F (36.2 C)  Resp: 16    Physical Exam  GENERAL APPEARANCE: Alert, conversant,  No acute distress.  SKIN: No diaphoresis rash HEAD: Normocephalic, atraumatic  EYES: Conjunctiva/lids clear. Pupils round, reactive. EOMs intact.  EARS: External exam WNL, canals clear. Hearing grossly normal.  NOSE: No deformity or discharge.  MOUTH/THROAT: Lips w/o lesions  RESPIRATORY: Breathing is even, unlabored. Lung sounds are clear   CARDIOVASCULAR: Heart RRR no murmurs, rubs or gallops. trace peripheral edema.   GASTROINTESTINAL: Abdomen is soft, non-tender, not distended w/ normal bowel sounds. GENITOURINARY: Bladder non tender, not distended  MUSCULOSKELETAL: rR arm in splint NEUROLOGIC:  Cranial nerves 2-12 grossly intact. Moves all  extremities  PSYCHIATRIC: Mood and affect appropriate to situation, no behavioral issues  Patient Active Problem List   Diagnosis Date Noted  . Displaced fracture of shaft of left humerus 04/03/2014  . Right arm fracture 04/01/2014  . Displaced spiral fracture of shaft of humerus   . Peripheral arterial disease 10/03/2013  . Chronic diastolic CHF (congestive heart failure) 08/08/2013  . Chronic venous insufficiency 04/02/2013  . Hypokalemia 01/13/2013  . PAF (paroxysmal atrial fibrillation) 10/03/2012  . CAD (coronary artery disease) of artery bypass graft 04/05/2012  . Atrial fibrillation with RVR 03/13/2012  . Need for prophylactic vaccination and inoculation against influenza 02/14/2012  . ABDOMINAL WALL HERNIA 01/21/2010  . PARESTHESIA 05/04/2009  . SKIN LESION 01/28/2009  . Nocturia 01/28/2009  . DYSPNEA 01/01/2009  . CVA (cerebral infarction) 11/17/2008  .  ALLERGY 10/27/2008  . PAROXYSMAL NOCTURNAL DYSPNEA 10/03/2008  . OSTEOARTHRITIS, KNEES, BILATERAL 09/03/2008  . RESTRICTIVE LUNG DISEASE 05/26/2008  . Anemia 01/08/2008  . UNS ADVRS EFF OTH RX MEDICINAL&BIOLOGICAL SBSTNC 11/09/2007  . HEMATOCHEZIA 07/18/2007  . PARANOIA 06/01/2007  . ANXIETY 01/23/2007  . Essential hypertension 01/23/2007  . GI BLEEDING 01/23/2007  . Osteoarthritis 01/23/2007  . DEGENERATIVE DISC DISEASE, LUMBAR SPINE 01/23/2007  . History of peptic ulcer disease 01/23/2007  . Personal History of Other Diseases of Digestive Disease 01/23/2007    CBC    Component Value Date/Time   WBC 5.5 04/07/2014 0653   RBC 3.02* 04/07/2014 0653   HGB 8.4* 04/07/2014 0653   HCT 26.8* 04/07/2014 0653   PLT 276 04/07/2014 0653   MCV 88.7 04/07/2014 0653   LYMPHSABS 1.0 10/01/2013 1214   MONOABS 0.6 10/01/2013 1214   EOSABS 0.0 10/01/2013 1214   BASOSABS 0.0 10/01/2013 1214    CMP     Component Value Date/Time   NA 140 04/02/2014 0201   K 4.7 04/02/2014 0201   CL 103 04/02/2014 0201   CO2 27  04/02/2014 0201   GLUCOSE 123* 04/02/2014 0201   BUN 12 04/02/2014 0201   CREATININE 0.77 04/02/2014 0201   CALCIUM 9.4 04/02/2014 0201   PROT 6.3 10/01/2013 1214   ALBUMIN 3.6 10/01/2013 1214   AST 19 10/01/2013 1214   ALT 22 10/01/2013 1214   ALKPHOS 61 10/01/2013 1214   BILITOT 0.6 10/01/2013 1214   GFRNONAA 76* 04/02/2014 0201   GFRAA 88* 04/02/2014 0201    Assessment and Plan  Displaced spiral fracture of shaft of humerus Pt is s/p OPEN REDUCTION INTERNAL FIXATION (ORIF) RIGHT HUMERUS DISTAL FRACTURE (Right) - Ortho on board while patient in house - Left knee was evaluated with 4 view x ray and no acute findings reported. - Pt will need continued PT services after d/c  PAF (paroxysmal atrial fibrillation) on xarelto at home, BB; patient continues to denie acute cardiopulmonary symptoms';  -echo (2014): LVEF: 55% to 60%. Wall motion was normal -on xarelto for anticoagulation   Essential hypertension on BB, cardizem, lasix; BP is soft; diuretic on hold periop; close monitor   Anemia no s/s of acute bleeding; Hb 8.4, no need for tx  Chronic diastolic CHF (congestive heart failure) clinically euvolemic; lasix held periop; resumed diuretics on discharge    Hennie Duos, MD

## 2014-04-16 NOTE — Assessment & Plan Note (Signed)
on BB, cardizem, lasix; BP is soft; diuretic on hold periop; close monitor

## 2014-04-16 NOTE — Assessment & Plan Note (Signed)
Pt is s/p OPEN REDUCTION INTERNAL FIXATION (ORIF) RIGHT HUMERUS DISTAL FRACTURE (Right) - Ortho on board while patient in house - Left knee was evaluated with 4 view x ray and no acute findings reported. - Pt will need continued PT services after d/c

## 2014-04-18 ENCOUNTER — Encounter: Payer: Self-pay | Admitting: Internal Medicine

## 2014-04-24 ENCOUNTER — Non-Acute Institutional Stay (SKILLED_NURSING_FACILITY): Payer: Medicare Other | Admitting: Internal Medicine

## 2014-04-24 ENCOUNTER — Encounter: Payer: Self-pay | Admitting: Internal Medicine

## 2014-04-24 DIAGNOSIS — I10 Essential (primary) hypertension: Secondary | ICD-10-CM

## 2014-04-24 DIAGNOSIS — I4891 Unspecified atrial fibrillation: Secondary | ICD-10-CM | POA: Diagnosis not present

## 2014-04-24 DIAGNOSIS — I5032 Chronic diastolic (congestive) heart failure: Secondary | ICD-10-CM

## 2014-04-24 DIAGNOSIS — S42341D Displaced spiral fracture of shaft of humerus, right arm, subsequent encounter for fracture with routine healing: Secondary | ICD-10-CM | POA: Diagnosis not present

## 2014-04-24 NOTE — Progress Notes (Signed)
Patient ID: Jacqueline [name], female   DOB: 1929-03-10, 80 y.o.   MRN: [id number]   This is an acute visit.  Level care skilled.  [facility].  Chief complaint-acute visit follow-up right upper arm edema and erythema-with recent history of distal humerus fracture repair-.  History of present illness.  Patient is a pleasant 80 year old female with a history of hypertension atrial fibrillation history CVA and CHF who  initially presented with right arm and left knee pain after falling.  She was found to have a right humeral fracture and subsequently underwent open reduction internal fixation of a distal fracture of the humerus.  Her knee was evaluated but found to have no acute findings-she is here for physical therapy.  She also has a previous history of atrial fibrillation continues on Xarelto as well as a beta blocker this was stable apparently during her hospitalization and has remained stable during her stay here  Her blood pressure also appears to be stable recent recent readings--104/60-124/63- she is on a beta blocker Cardizem as well as Lasix-her blood pressure was low in the hospital her diuretic was put on hold but this is been restarted and appears to have tolerated this well.  She has a history of diastolic CHF-her Lasix has been restarted upon hospital discharge this appears to be stable she does not complain of any increased shortness of breath appears to have minimal edema.  This morning however her orthopedic tech did notice some increased edema of her right upper arm after the splint was removed-apparently she does have some increased edema baseline since his surgery but there is a pocket of fluid in the medial aspect of the lower right arm-this is not really tender slightly warm-according to the orthopedic tech the erythema of the lower upper arm is somewhat increased from baseline with slight warmth.  Patient herself is not really complaining of any pain apparently is  Jordan well with therapy  Vital signs are stable she is afebrile.    Previous medical history.  Right humerus fracture status post ORIF.  Hypertension.  History CVA.  Osteoarthritis.  Peptic ulcer disease.  Atrial fibrillation.  Chronic diastolic CHF.  Family medical history is been reviewed per discharge summary on 04/07/2014.  Medications.  Xanax 0.125 mg twice a day when necessary.  Vicodin 7.5-325 mg one tablet every 6 hours when necessary.  Vitamin D3-5000 units daily.  Clonidine 0.1 mg every 2 hours for blood pressure rate are than systolic of 086 or diastolic greater than 578.  Diltiazem 180 mg twice a day.  Lasix 40 mg daily.  Toprol-XL 50 mg twice a day.  Multivitamin daily.  Protonix 40 mg daily.  Potassium 20 mEq daily.  Xarelto-50 mg by mouth daily  Review of systems.  In general no complaints of fever or chills.  Skin does not complain of any rashes or itching.  Head ears eyes nose mouth and throat-does not complain of visual changes nasal discharge or sore throat.  Respiratory no shortness of breath-has somewhat chronic cough especially in the morning but this is unchanged.  Cardiac does not complain of chest pain does not really have significant lower extremity edema. I would say trace bilaterally--  GI does not complain of abdominal discomfort nausea vomiting diarrhea or constipation.  Musculoskeletal- resolved does not really complain of pain even in the right upper arm area  .      -neurologic does not complain of dizziness headache or numbness.  Psych does have some history of  anxiety this appears to be well controlled she does get when necessary Xanax.  Physical exam.  Temperature 98.0 pulse 92 respirations 20 blood pressure 104/60-124/63-in this range .  In general this is a pleasant elderly female in no distress--sitting comfortably in her wheelchair.  Her skin is warm and dry--does have some erythema the right  upper arm area as noted below  Eyes pupils appear reactive light sclera and conjunctiva are clear visual acuity appears grossly intact.  Oropharynx clear mucous membranes moist.  Chest is clear to auscultation there is no labored breathing.  Heart is regular rate and rhythm without murmur gallop or she does not have significant lower extremity edema--I would say trace lower extremity edema   Abdomen soft nontender positive bowel sounds.  Musculoskeletal --saw extremities 4-with limited range of motion of her right upper arm-apparently 90% range of motion according to her orthopedictech-she does have some erythema  And edema lower upper right arm superior to the elbow according to the orthopedic tech this is slightly increased from what she saw yesterday and slightly more warm-there appears to be a fluid pocket in the medial right upper lower arm superior to the elbow area this does not appear to be acutely tender either-she does have a positive radial pulse-I also note a small skin tear on the lower right arm this appears fairly benign without drainage or bleeding-also is one suture remaining surgical site at the elbow .  Neurologic appears grossly intact speech is clear I do not see lateralizing findings.  Psych she is alert and oriented pleasant.  Labs.  04/15/2014.  Sodium 140 potassium 3.7 BUN 16 creatinine 0.6.  WBC 4.6 hemoglobin 8.2 platelets 328.    04/10/2014.  WBC 6.5 hemoglobin 8.3 platelets 338.  Sodium 139 potassium 3.6 BUN 11 creatinine 0.6.  Liver function tests within normal limits except albumin of 2.9.  Assessment and plan.  #1-history of right humerus fracture-this is followed by orthopedic status post ORIF --she appears to have some mildly increased edema and a fluid pocket possibly some increased erythema and warmth clear to although difficult to tell us this is an acute change-apparently a mild change compared to previous exam per orthopedic tech-this was  discussed with Dr. Sheppard Coil via phone-will obtain a venous Doppler to rule out a DVT-also will obtain an x-ray to rule out any displacement or changes-.  I do note she continues on Xarelto for anticoagulation with history of A. Fib  Orthopedics also has been notified-since it is a holiday this is somewhat challenging arranging follow-up-however patient does appear to be stable will have to be monitored closely however.  Monitor vital signs pulse ox every shift and notify provider of any changes of the right upper arm increased pain-this was discussed with patient as well.  For any possible cellulitis etiology will start doxycycline 100 mg twice a day for 10 days along with a probiotic twice a day for 10 days and again monitor this  #2 history of atrial fibrillation this appears rate controlled on beta blocker she is onXarelto for anticoagulation--ulcers run it appears largely in the 80s 90s-  #3-hypertension this appears to be well controlled on numerous agents including Toprol Cardizem and Lasix blood pressures appear to be stable despite history of low blood pressures in the hospital recent blood pressures 104/60-124/63  #1-LXBWIOM of diastolic CHF-clinically this appears stable I do not really see significant edema she has been restarted on Lasix blood pressures appear to be stable despite being on the Lasix-continue  to monitor weights notify provider of gain greater than 3 pounds.-- Also apparently some history of hypokalemia--she is on supplementation with the Lasix-will obtain metabolic panel next lab day-labs here so far have been quite benign  #5-history of anemia I suspect this is postop-will update a CBC and has been stable per review of recent labs-will recheck this next lab day   6 anxiety she is on Xanax low-dose she gets this frequently apparent she has been on this for years and it has been effective     [id number]-of note greater than 35 minutes spent assessing  patient-reviewing her medical records-and coordinating and formulating a plan of care with extensive discussion with her orthopedic tech-as well as with nursing-and Dr. Sheppard Coil via phone-of note greater than 50% of time spent coordinating plan of care

## 2014-04-30 DIAGNOSIS — Z4789 Encounter for other orthopedic aftercare: Secondary | ICD-10-CM | POA: Diagnosis not present

## 2014-05-28 DIAGNOSIS — G5602 Carpal tunnel syndrome, left upper limb: Secondary | ICD-10-CM | POA: Diagnosis not present

## 2014-05-28 DIAGNOSIS — S42401D Unspecified fracture of lower end of right humerus, subsequent encounter for fracture with routine healing: Secondary | ICD-10-CM | POA: Diagnosis not present

## 2014-06-02 ENCOUNTER — Non-Acute Institutional Stay (SKILLED_NURSING_FACILITY): Payer: Medicare Other | Admitting: Internal Medicine

## 2014-06-02 ENCOUNTER — Encounter: Payer: Self-pay | Admitting: Internal Medicine

## 2014-06-02 DIAGNOSIS — I480 Paroxysmal atrial fibrillation: Secondary | ICD-10-CM | POA: Diagnosis not present

## 2014-06-02 DIAGNOSIS — I10 Essential (primary) hypertension: Secondary | ICD-10-CM | POA: Diagnosis not present

## 2014-06-02 DIAGNOSIS — I5032 Chronic diastolic (congestive) heart failure: Secondary | ICD-10-CM | POA: Diagnosis not present

## 2014-06-02 DIAGNOSIS — S42341D Displaced spiral fracture of shaft of humerus, right arm, subsequent encounter for fracture with routine healing: Secondary | ICD-10-CM

## 2014-06-02 DIAGNOSIS — Z8711 Personal history of peptic ulcer disease: Secondary | ICD-10-CM | POA: Diagnosis not present

## 2014-06-02 NOTE — Progress Notes (Signed)
MRN: [id number] Name: Jacqueline [name]  Sex: female Age: 81 y.o. DOB: [birth year]  [facility] #: [facility] Facility/Room:102 Level Of Care: SNF Provider: Inocencio Homes D Emergency Contacts: Extended Emergency Contact Information Primary Emergency Contact: Marlowe Alt of [place] Mobile Phone: [phone] Relation: Son Secondary Emergency Contact: Rampey,Flake Address: [address]          [place], [place] Mobile Phone: [phone] Relation: Son  Code Status:FULL   Allergies: Metoclopramide hcl; Penicillins; Aspirin; and Zetia  Chief Complaint  Patient presents with  . Discharge Note    HPI: Patient is 81 y.o. female who was admitted to SNF for r arm fx with surgery who is now ready to be d/c to home.  Past Medical History  Diagnosis Date  . Allergy, unspecified not elsewhere classified   . Other dyspnea and respiratory abnormality   . Other diseases of lung, not elsewhere classified   . Anemia, unspecified   . Unspecified adverse effect of other drug, medicinal and biological substance(995.29)   . Blood in stool   . Hemorrhage of gastrointestinal tract, unspecified   . Personal history of peptic ulcer disease   . Anxiety state, unspecified   . Personal history of other diseases of digestive system   . Unspecified essential hypertension   . Complication of anesthesia     "hard to wake me up after colonoscopy" (03/13/12)  . H/O hiatal hernia   . GERD (gastroesophageal reflux disease)   . Unspecified paranoid state   . Atrial fibrillation with rapid ventricular response 03/13/12  . Osteoarthrosis, unspecified whether generalized or localized, lower leg   . Degeneration of lumbar or lumbosacral intervertebral disc   . Unspecified cerebral artery occlusion with cerebral infarction 10/2008    "mini stroke" denies residual (03/13/12)  . History of IBS     Past Surgical History  Procedure Laterality Date  . Pilonidal cyst / sinus excision   1957  . Dilation and curettage of uterus  1950's?  . Shoulder arthroscopy  ~ 2000    right  . Orif humerus fracture Right 04/03/2014    Procedure: OPEN REDUCTION INTERNAL FIXATION (ORIF) RIGHT HUMERUS DISTAL FRACTURE;  Surgeon: Roseanne Kaufman, MD;  Location: [facility];  Service: Orthopedics;  Laterality: Right;      Medication List       This list is accurate as of: 06/02/14  3:41 PM.  Always use your most recent med list.               ALPRAZolam 0.25 MG tablet  Commonly known as:  XANAX  Take 1/2 tablet by mouth every 3 hours as needed for anxiety     CENTRUM SILVER PO  Take 1 tablet by mouth daily.     cloNIDine 0.1 MG tablet  Commonly known as:  CATAPRES  Take 1 tablet (0.1 mg total) by mouth every 2 (two) hours as needed (take as needed for a  Blood pressure that is greater than 160 on top, or bottom number greater than 100.).     DILTIAZEM HCL ER PO  Take 180 mg by mouth 2 (two) times daily. Before meals     furosemide 40 MG tablet  Commonly known as:  LASIX  Take 1 tablet (40 mg total) by mouth daily.     HYDROcodone-acetaminophen 7.5-325 MG per tablet  Commonly known as:  NORCO  Take one tablet by mouth every 6 hours as needed for moderate pain     metoprolol succinate 50  MG 24 hr tablet  Commonly known as:  TOPROL-XL  TAKE 1 TABLET (50 MG TOTAL) BY MOUTH 2 (TWO) TIMES DAILY. TAKE WITH OR IMMEDIATELY FOLLOWING A MEAL.     pantoprazole 40 MG tablet  Commonly known as:  PROTONIX  TAKE 1 TABLET EVERY MORNING     potassium chloride SA 20 MEQ tablet  Commonly known as:  K-DUR,KLOR-CON  TAKE 1 TABLET EVERY DAY     Vitamin D3 5000 UNITS Caps  Take 5,000 Units by mouth daily.     XARELTO 15 MG Tabs tablet  Generic drug:  Rivaroxaban  TAKE 1 TABLET EVERY DAY        No orders of the defined types were placed in this encounter.    Immunization History  Administered Date(s) Administered  . Influenza Split 01/24/2011, 02/14/2012  . Influenza Whole 01/28/2009   . Influenza,inj,Quad PF,36+ Mos 01/14/2013, 04/05/2014  . Pneumococcal Polysaccharide-23 01/28/2009  . Tdap 03/31/2014  . Zoster 01/24/2011    History  Substance Use Topics  . Smoking status: Former Smoker -- 1.00 packs/day for 40 years    Types: Cigarettes    Quit date: 05/02/2002  . Smokeless tobacco: Never Used  . Alcohol Use: No    Filed Vitals:   06/02/14 1522  BP: 105/60  Pulse: 78  Temp: 96.7 F (35.9 C)  Resp: 20    Physical Exam  GENERAL APPEARANCE: Alert, conversant. No acute distress.  HEENT: Unremarkable. RESPIRATORY: Breathing is even, unlabored. Lung sounds are clear   CARDIOVASCULAR: Heart RRR no murmurs, rubs or gallops. No peripheral edema.  GASTROINTESTINAL: Abdomen is soft, non-tender, not distended w/ normal bowel sounds.  NEUROLOGIC: Cranial nerves 2-12 grossly intact. Moves all extremities  Patient Active Problem List   Diagnosis Date Noted  . Displaced fracture of shaft of left humerus 04/03/2014  . Right arm fracture 04/01/2014  . Displaced spiral fracture of shaft of humerus   . Peripheral arterial disease 10/03/2013  . Chronic diastolic CHF (congestive heart failure) 08/08/2013  . Chronic venous insufficiency 04/02/2013  . Hypokalemia 01/13/2013  . PAF (paroxysmal atrial fibrillation) 10/03/2012  . CAD (coronary artery disease) of artery bypass graft 04/05/2012  . Atrial fibrillation with RVR 03/13/2012  . Need for prophylactic vaccination and inoculation against influenza 02/14/2012  . ABDOMINAL WALL HERNIA 01/21/2010  . PARESTHESIA 05/04/2009  . SKIN LESION 01/28/2009  . Nocturia 01/28/2009  . DYSPNEA 01/01/2009  . CVA (cerebral infarction) 11/17/2008  . ALLERGY 10/27/2008  . PAROXYSMAL NOCTURNAL DYSPNEA 10/03/2008  . Osteoarthrosis, unspecified whether generalized or localized, involving lower leg 09/03/2008  . RESTRICTIVE LUNG DISEASE 05/26/2008  . Anemia 01/08/2008  . UNS ADVRS EFF OTH RX MEDICINAL&BIOLOGICAL SBSTNC 11/09/2007   . HEMATOCHEZIA 07/18/2007  . PARANOIA 06/01/2007  . ANXIETY 01/23/2007  . Essential hypertension 01/23/2007  . GI BLEEDING 01/23/2007  . Osteoarthritis 01/23/2007  . DEGENERATIVE DISC DISEASE, LUMBAR SPINE 01/23/2007  . History of peptic ulcer disease 01/23/2007  . Personal History of Other Diseases of Digestive Disease 01/23/2007    CBC    Component Value Date/Time   WBC 5.5 04/07/2014 0653   RBC 3.02* 04/07/2014 0653   HGB 8.4* 04/07/2014 0653   HCT 26.8* 04/07/2014 0653   PLT 276 04/07/2014 0653   MCV 88.7 04/07/2014 0653   LYMPHSABS 1.0 10/01/2013 1214   MONOABS 0.6 10/01/2013 1214   EOSABS 0.0 10/01/2013 1214   BASOSABS 0.0 10/01/2013 1214    CMP     Component Value Date/Time   NA 140  04/02/2014 0201   K 4.7 04/02/2014 0201   CL 103 04/02/2014 0201   CO2 27 04/02/2014 0201   GLUCOSE 123* 04/02/2014 0201   BUN 12 04/02/2014 0201   CREATININE 0.77 04/02/2014 0201   CALCIUM 9.4 04/02/2014 0201   PROT 6.3 10/01/2013 1214   ALBUMIN 3.6 10/01/2013 1214   AST 19 10/01/2013 1214   ALT 22 10/01/2013 1214   ALKPHOS 61 10/01/2013 1214   BILITOT 0.6 10/01/2013 1214   GFRNONAA 76* 04/02/2014 0201   GFRAA 88* 04/02/2014 0201    Assessment and Plan  Pt is improved and stable to be d/c to home with HH/OT/PT and Education officer, museum.  Hennie Duos, MD

## 2014-06-11 DIAGNOSIS — I10 Essential (primary) hypertension: Secondary | ICD-10-CM | POA: Diagnosis not present

## 2014-06-11 DIAGNOSIS — K219 Gastro-esophageal reflux disease without esophagitis: Secondary | ICD-10-CM | POA: Diagnosis not present

## 2014-06-11 DIAGNOSIS — S42401D Unspecified fracture of lower end of right humerus, subsequent encounter for fracture with routine healing: Secondary | ICD-10-CM | POA: Diagnosis not present

## 2014-06-11 DIAGNOSIS — F419 Anxiety disorder, unspecified: Secondary | ICD-10-CM | POA: Diagnosis not present

## 2014-06-11 DIAGNOSIS — Z87891 Personal history of nicotine dependence: Secondary | ICD-10-CM | POA: Diagnosis not present

## 2014-06-11 DIAGNOSIS — I4891 Unspecified atrial fibrillation: Secondary | ICD-10-CM | POA: Diagnosis not present

## 2014-06-11 DIAGNOSIS — I5032 Chronic diastolic (congestive) heart failure: Secondary | ICD-10-CM | POA: Diagnosis not present

## 2014-06-11 DIAGNOSIS — I2510 Atherosclerotic heart disease of native coronary artery without angina pectoris: Secondary | ICD-10-CM | POA: Diagnosis not present

## 2014-06-11 DIAGNOSIS — D649 Anemia, unspecified: Secondary | ICD-10-CM | POA: Diagnosis not present

## 2014-06-11 DIAGNOSIS — Z7901 Long term (current) use of anticoagulants: Secondary | ICD-10-CM | POA: Diagnosis not present

## 2014-06-11 DIAGNOSIS — M179 Osteoarthritis of knee, unspecified: Secondary | ICD-10-CM | POA: Diagnosis not present

## 2014-06-11 DIAGNOSIS — M810 Age-related osteoporosis without current pathological fracture: Secondary | ICD-10-CM | POA: Diagnosis not present

## 2014-06-11 DIAGNOSIS — M1710 Unilateral primary osteoarthritis, unspecified knee: Secondary | ICD-10-CM | POA: Diagnosis not present

## 2014-06-11 DIAGNOSIS — M5136 Other intervertebral disc degeneration, lumbar region: Secondary | ICD-10-CM | POA: Diagnosis not present

## 2014-06-11 DIAGNOSIS — Z9181 History of falling: Secondary | ICD-10-CM | POA: Diagnosis not present

## 2014-06-11 DIAGNOSIS — Z8673 Personal history of transient ischemic attack (TIA), and cerebral infarction without residual deficits: Secondary | ICD-10-CM | POA: Diagnosis not present

## 2014-06-12 DIAGNOSIS — I10 Essential (primary) hypertension: Secondary | ICD-10-CM | POA: Diagnosis not present

## 2014-06-12 DIAGNOSIS — M179 Osteoarthritis of knee, unspecified: Secondary | ICD-10-CM | POA: Diagnosis not present

## 2014-06-12 DIAGNOSIS — I2510 Atherosclerotic heart disease of native coronary artery without angina pectoris: Secondary | ICD-10-CM | POA: Diagnosis not present

## 2014-06-12 DIAGNOSIS — S42401D Unspecified fracture of lower end of right humerus, subsequent encounter for fracture with routine healing: Secondary | ICD-10-CM | POA: Diagnosis not present

## 2014-06-12 DIAGNOSIS — M5136 Other intervertebral disc degeneration, lumbar region: Secondary | ICD-10-CM | POA: Diagnosis not present

## 2014-06-12 DIAGNOSIS — I5032 Chronic diastolic (congestive) heart failure: Secondary | ICD-10-CM | POA: Diagnosis not present

## 2014-06-17 DIAGNOSIS — S42401D Unspecified fracture of lower end of right humerus, subsequent encounter for fracture with routine healing: Secondary | ICD-10-CM | POA: Diagnosis not present

## 2014-06-17 DIAGNOSIS — M5136 Other intervertebral disc degeneration, lumbar region: Secondary | ICD-10-CM | POA: Diagnosis not present

## 2014-06-17 DIAGNOSIS — M179 Osteoarthritis of knee, unspecified: Secondary | ICD-10-CM | POA: Diagnosis not present

## 2014-06-17 DIAGNOSIS — I5032 Chronic diastolic (congestive) heart failure: Secondary | ICD-10-CM | POA: Diagnosis not present

## 2014-06-17 DIAGNOSIS — I2510 Atherosclerotic heart disease of native coronary artery without angina pectoris: Secondary | ICD-10-CM | POA: Diagnosis not present

## 2014-06-17 DIAGNOSIS — I10 Essential (primary) hypertension: Secondary | ICD-10-CM | POA: Diagnosis not present

## 2014-06-18 ENCOUNTER — Emergency Department (HOSPITAL_COMMUNITY): Payer: Medicare Other

## 2014-06-18 ENCOUNTER — Encounter (HOSPITAL_COMMUNITY): Payer: Self-pay | Admitting: Emergency Medicine

## 2014-06-18 ENCOUNTER — Ambulatory Visit: Payer: Medicare Other | Admitting: Internal Medicine

## 2014-06-18 ENCOUNTER — Inpatient Hospital Stay (HOSPITAL_COMMUNITY): Payer: Medicare Other

## 2014-06-18 ENCOUNTER — Inpatient Hospital Stay (HOSPITAL_COMMUNITY)
Admission: EM | Admit: 2014-06-18 | Discharge: 2014-06-21 | DRG: 065 | Disposition: A | Discharge status: Skilled Nursing Facility | Payer: Medicare Other | Attending: Internal Medicine | Admitting: Internal Medicine

## 2014-06-18 DIAGNOSIS — R29810 Facial weakness: Secondary | ICD-10-CM | POA: Insufficient documentation

## 2014-06-18 DIAGNOSIS — E785 Hyperlipidemia, unspecified: Secondary | ICD-10-CM | POA: Diagnosis not present

## 2014-06-18 DIAGNOSIS — G459 Transient cerebral ischemic attack, unspecified: Secondary | ICD-10-CM | POA: Diagnosis not present

## 2014-06-18 DIAGNOSIS — Z880 Allergy status to penicillin: Secondary | ICD-10-CM | POA: Diagnosis not present

## 2014-06-18 DIAGNOSIS — I6529 Occlusion and stenosis of unspecified carotid artery: Secondary | ICD-10-CM | POA: Diagnosis not present

## 2014-06-18 DIAGNOSIS — I110 Hypertensive heart disease with heart failure: Secondary | ICD-10-CM | POA: Diagnosis present

## 2014-06-18 DIAGNOSIS — Z886 Allergy status to analgesic agent status: Secondary | ICD-10-CM

## 2014-06-18 DIAGNOSIS — Z8711 Personal history of peptic ulcer disease: Secondary | ICD-10-CM

## 2014-06-18 DIAGNOSIS — K589 Irritable bowel syndrome without diarrhea: Secondary | ICD-10-CM | POA: Diagnosis present

## 2014-06-18 DIAGNOSIS — R471 Dysarthria and anarthria: Secondary | ICD-10-CM

## 2014-06-18 DIAGNOSIS — R2681 Unsteadiness on feet: Secondary | ICD-10-CM | POA: Diagnosis not present

## 2014-06-18 DIAGNOSIS — R4701 Aphasia: Secondary | ICD-10-CM | POA: Diagnosis not present

## 2014-06-18 DIAGNOSIS — R1312 Dysphagia, oropharyngeal phase: Secondary | ICD-10-CM | POA: Diagnosis not present

## 2014-06-18 DIAGNOSIS — K449 Diaphragmatic hernia without obstruction or gangrene: Secondary | ICD-10-CM | POA: Diagnosis present

## 2014-06-18 DIAGNOSIS — I4891 Unspecified atrial fibrillation: Secondary | ICD-10-CM | POA: Diagnosis not present

## 2014-06-18 DIAGNOSIS — I5032 Chronic diastolic (congestive) heart failure: Secondary | ICD-10-CM | POA: Diagnosis present

## 2014-06-18 DIAGNOSIS — I63411 Cerebral infarction due to embolism of right middle cerebral artery: Secondary | ICD-10-CM | POA: Insufficient documentation

## 2014-06-18 DIAGNOSIS — I6789 Other cerebrovascular disease: Secondary | ICD-10-CM | POA: Diagnosis not present

## 2014-06-18 DIAGNOSIS — M179 Osteoarthritis of knee, unspecified: Secondary | ICD-10-CM | POA: Diagnosis present

## 2014-06-18 DIAGNOSIS — I69392 Facial weakness following cerebral infarction: Secondary | ICD-10-CM | POA: Diagnosis not present

## 2014-06-18 DIAGNOSIS — Z9181 History of falling: Secondary | ICD-10-CM | POA: Diagnosis not present

## 2014-06-18 DIAGNOSIS — I480 Paroxysmal atrial fibrillation: Secondary | ICD-10-CM | POA: Diagnosis present

## 2014-06-18 DIAGNOSIS — I672 Cerebral atherosclerosis: Secondary | ICD-10-CM | POA: Diagnosis not present

## 2014-06-18 DIAGNOSIS — D649 Anemia, unspecified: Secondary | ICD-10-CM | POA: Diagnosis present

## 2014-06-18 DIAGNOSIS — I639 Cerebral infarction, unspecified: Secondary | ICD-10-CM | POA: Diagnosis not present

## 2014-06-18 DIAGNOSIS — I63511 Cerebral infarction due to unspecified occlusion or stenosis of right middle cerebral artery: Principal | ICD-10-CM | POA: Diagnosis present

## 2014-06-18 DIAGNOSIS — Z7901 Long term (current) use of anticoagulants: Secondary | ICD-10-CM

## 2014-06-18 DIAGNOSIS — F411 Generalized anxiety disorder: Secondary | ICD-10-CM | POA: Diagnosis present

## 2014-06-18 DIAGNOSIS — I69322 Dysarthria following cerebral infarction: Secondary | ICD-10-CM | POA: Diagnosis not present

## 2014-06-18 DIAGNOSIS — I10 Essential (primary) hypertension: Secondary | ICD-10-CM

## 2014-06-18 DIAGNOSIS — M6281 Muscle weakness (generalized): Secondary | ICD-10-CM | POA: Diagnosis not present

## 2014-06-18 DIAGNOSIS — K219 Gastro-esophageal reflux disease without esophagitis: Secondary | ICD-10-CM | POA: Diagnosis present

## 2014-06-18 DIAGNOSIS — I272 Other secondary pulmonary hypertension: Secondary | ICD-10-CM | POA: Diagnosis present

## 2014-06-18 DIAGNOSIS — S42401D Unspecified fracture of lower end of right humerus, subsequent encounter for fracture with routine healing: Secondary | ICD-10-CM | POA: Diagnosis not present

## 2014-06-18 DIAGNOSIS — R278 Other lack of coordination: Secondary | ICD-10-CM | POA: Diagnosis not present

## 2014-06-18 DIAGNOSIS — M5136 Other intervertebral disc degeneration, lumbar region: Secondary | ICD-10-CM | POA: Diagnosis not present

## 2014-06-18 DIAGNOSIS — R41841 Cognitive communication deficit: Secondary | ICD-10-CM | POA: Diagnosis not present

## 2014-06-18 DIAGNOSIS — I69328 Other speech and language deficits following cerebral infarction: Secondary | ICD-10-CM | POA: Diagnosis not present

## 2014-06-18 DIAGNOSIS — G510 Bell's palsy: Secondary | ICD-10-CM | POA: Diagnosis not present

## 2014-06-18 DIAGNOSIS — I2510 Atherosclerotic heart disease of native coronary artery without angina pectoris: Secondary | ICD-10-CM | POA: Diagnosis not present

## 2014-06-18 LAB — PROTIME-INR
INR: 1.64 — ABNORMAL HIGH (ref 0.00–1.49)
Prothrombin Time: 19.5 seconds — ABNORMAL HIGH (ref 11.6–15.2)

## 2014-06-18 LAB — I-STAT TROPONIN, ED: Troponin i, poc: 0.01 ng/mL (ref 0.00–0.08)

## 2014-06-18 LAB — COMPREHENSIVE METABOLIC PANEL
ALT: 11 U/L (ref 0–35)
ANION GAP: 5 (ref 5–15)
AST: 20 U/L (ref 0–37)
Albumin: 3.8 g/dL (ref 3.5–5.2)
Alkaline Phosphatase: 125 U/L — ABNORMAL HIGH (ref 39–117)
BILIRUBIN TOTAL: 0.4 mg/dL (ref 0.3–1.2)
BUN: 18 mg/dL (ref 6–23)
CALCIUM: 9.6 mg/dL (ref 8.4–10.5)
CO2: 30 mmol/L (ref 19–32)
Chloride: 103 mmol/L (ref 96–112)
Creatinine, Ser: 0.9 mg/dL (ref 0.50–1.10)
GFR calc Af Amer: 67 mL/min — ABNORMAL LOW (ref >90)
GFR calc non Af Amer: 58 mL/min — ABNORMAL LOW (ref >90)
Glucose, Bld: 100 mg/dL — ABNORMAL HIGH (ref 70–99)
Potassium: 3.8 mmol/L (ref 3.5–5.1)
SODIUM: 138 mmol/L (ref 135–145)
TOTAL PROTEIN: 6.8 g/dL (ref 6.0–8.3)

## 2014-06-18 LAB — ETHANOL: Alcohol, Ethyl (B): <5 mg/dL (ref 0–9)

## 2014-06-18 LAB — I-STAT CHEM 8, ED
BUN: 19 mg/dL (ref 6–23)
CALCIUM ION: 1.18 mmol/L (ref 1.13–1.30)
CHLORIDE: 102 mmol/L (ref 96–112)
Creatinine, Ser: 0.9 mg/dL (ref 0.50–1.10)
Glucose, Bld: 101 mg/dL — ABNORMAL HIGH (ref 70–99)
HEMATOCRIT: 33 % — AB (ref 36.0–46.0)
Hemoglobin: 11.2 g/dL — ABNORMAL LOW (ref 12.0–15.0)
POTASSIUM: 3.8 mmol/L (ref 3.5–5.1)
Sodium: 138 mmol/L (ref 135–145)
TCO2: 24 mmol/L (ref 0–100)

## 2014-06-18 LAB — URINALYSIS, ROUTINE W REFLEX MICROSCOPIC
BILIRUBIN URINE: NEGATIVE
Glucose, UA: NEGATIVE mg/dL
Hgb urine dipstick: NEGATIVE
KETONES UR: NEGATIVE mg/dL
NITRITE: NEGATIVE
Protein, ur: NEGATIVE mg/dL
Specific Gravity, Urine: 1.017 (ref 1.005–1.030)
UROBILINOGEN UA: 0.2 mg/dL (ref 0.0–1.0)
pH: 5.5 (ref 5.0–8.0)

## 2014-06-18 LAB — CBC
HCT: 32.4 % — ABNORMAL LOW (ref 36.0–46.0)
Hemoglobin: 9.9 g/dL — ABNORMAL LOW (ref 12.0–15.0)
MCH: 24.4 pg — ABNORMAL LOW (ref 26.0–34.0)
MCHC: 30.6 g/dL (ref 30.0–36.0)
MCV: 80 fL (ref 78.0–100.0)
Platelets: 242 10*3/uL (ref 150–400)
RBC: 4.05 MIL/uL (ref 3.87–5.11)
RDW: 16.2 % — AB (ref 11.5–15.5)
WBC: 6.6 10*3/uL (ref 4.0–10.5)

## 2014-06-18 LAB — RAPID URINE DRUG SCREEN, HOSP PERFORMED
AMPHETAMINES: NOT DETECTED
Barbiturates: NOT DETECTED
Benzodiazepines: POSITIVE — AB
Cocaine: NOT DETECTED
Opiates: POSITIVE — AB
TETRAHYDROCANNABINOL: NOT DETECTED

## 2014-06-18 LAB — DIFFERENTIAL
BASOS PCT: 1 % (ref 0–1)
Basophils Absolute: 0 10*3/uL (ref 0.0–0.1)
Eosinophils Absolute: 0.1 10*3/uL (ref 0.0–0.7)
Eosinophils Relative: 1 % (ref 0–5)
LYMPHS ABS: 1.7 10*3/uL (ref 0.7–4.0)
Lymphocytes Relative: 26 % (ref 12–46)
MONO ABS: 0.6 10*3/uL (ref 0.1–1.0)
MONOS PCT: 9 % (ref 3–12)
Neutro Abs: 4.2 10*3/uL (ref 1.7–7.7)
Neutrophils Relative %: 63 % (ref 43–77)

## 2014-06-18 LAB — CBG MONITORING, ED: Glucose-Capillary: 92 mg/dL (ref 70–99)

## 2014-06-18 LAB — URINE MICROSCOPIC-ADD ON

## 2014-06-18 LAB — APTT: APTT: 40 s — AB (ref 24–37)

## 2014-06-18 MED ORDER — STROKE: EARLY STAGES OF RECOVERY BOOK
Freq: Once | Status: AC
  Administered 2014-06-18

## 2014-06-18 MED ORDER — FUROSEMIDE 40 MG PO TABS: 40.0000 mg | ORAL_TABLET | Freq: Every day | ORAL | Status: DC

## 2014-06-18 MED ORDER — DILTIAZEM HCL ER 90 MG PO CP12
180.0000 mg | ORAL_CAPSULE | Freq: Two times a day (BID) | ORAL | Status: DC
  Administered 2014-06-19 – 2014-06-21 (×3): 180 mg via ORAL
  Filled 2014-06-18 (×7): qty 2

## 2014-06-18 MED ORDER — ALPRAZOLAM 0.25 MG PO TABS
0.1250 mg | ORAL_TABLET | Freq: Three times a day (TID) | ORAL | Status: DC | PRN
  Administered 2014-06-19 – 2014-06-20 (×2): 0.125 mg via ORAL
  Filled 2014-06-18 (×2): qty 1

## 2014-06-18 MED ORDER — POTASSIUM CHLORIDE CRYS ER 20 MEQ PO TBCR: 30.0000 meq | EXTENDED_RELEASE_TABLET | Freq: Every day | ORAL | Status: DC

## 2014-06-18 MED ORDER — RIVAROXABAN 15 MG PO TABS
15.0000 mg | ORAL_TABLET | Freq: Every day | ORAL | Status: DC
  Administered 2014-06-19: 15 mg via ORAL
  Filled 2014-06-18: qty 1

## 2014-06-18 MED ORDER — HYDROCODONE-ACETAMINOPHEN 7.5-325 MG PO TABS
1.0000 | ORAL_TABLET | Freq: Four times a day (QID) | ORAL | Status: DC | PRN
  Administered 2014-06-19 – 2014-06-21 (×7): 1 via ORAL
  Filled 2014-06-18 (×7): qty 1

## 2014-06-18 MED ORDER — LORAZEPAM 2 MG/ML IJ SOLN
1.0000 mg | Freq: Once | INTRAMUSCULAR | Status: AC
  Administered 2014-06-18: 1 mg via INTRAVENOUS
  Filled 2014-06-18: qty 1

## 2014-06-18 MED ORDER — PANTOPRAZOLE SODIUM 40 MG PO TBEC
40.0000 mg | DELAYED_RELEASE_TABLET | Freq: Every day | ORAL | Status: DC
  Administered 2014-06-19 – 2014-06-21 (×3): 40 mg via ORAL
  Filled 2014-06-18 (×3): qty 1

## 2014-06-18 NOTE — Consult Note (Signed)
Admission H&P    Chief Complaint: Acute onset of slurred speech and left facial droop  HPI: Jacqueline [name] is an 81 y.o. female with a history of hypertension, hyperlipidemia, TIAs and atrial fibrillation who was brought to the emergency room and code stroke status following acute onset of left facial droop and slurred speech. She was last known well at 2:30 PM today. She's been on anticoagulation with [name]. CT scan of her head showed no acute intracranial abnormality. Old left cerebral infarctions were noted. Deficits were improving when she arrived in the emergency room and subsequently cleared. NIH stroke score was 0. She had no focal weakness nor numbness. She also had no visual changes.   LSN: 2:30 PM on 06/18/2014 tPA Given: No: Deficits resolved mRankin:  Past Medical History  Diagnosis Date  . Allergy, unspecified not elsewhere classified   . Other dyspnea and respiratory abnormality   . Other diseases of lung, not elsewhere classified   . Anemia, unspecified   . Unspecified adverse effect of other drug, medicinal and biological substance(995.29)   . Blood in stool   . Hemorrhage of gastrointestinal tract, unspecified   . Personal history of peptic ulcer disease   . Anxiety state, unspecified   . Personal history of other diseases of digestive system   . Unspecified essential hypertension   . Complication of anesthesia     "hard to wake me up after colonoscopy" (03/13/12)  . H/O hiatal hernia   . GERD (gastroesophageal reflux disease)   . Unspecified paranoid state   . Atrial fibrillation with rapid ventricular response 03/13/12  . Osteoarthrosis, unspecified whether generalized or localized, lower leg   . Degeneration of lumbar or lumbosacral intervertebral disc   . Unspecified cerebral artery occlusion with cerebral infarction 10/2008    "mini stroke" denies residual (03/13/12)  . History of IBS     Past Surgical History  Procedure Laterality Date  . Pilonidal cyst /  sinus excision  1957  . Dilation and curettage of uterus  1950's?  . Shoulder arthroscopy  ~ 2000    right  . Orif humerus fracture Right 04/03/2014    Procedure: OPEN REDUCTION INTERNAL FIXATION (ORIF) RIGHT HUMERUS DISTAL FRACTURE;  Surgeon: Roseanne Kaufman, MD;  Location: [facility];  Service: Orthopedics;  Laterality: Right;    Family History  Problem Relation Age of Onset  . Diabetes Mother   . CVA Mother   . CVA Father    Social History:  reports that she quit smoking about 12 years ago. Her smoking use included Cigarettes. She has a 40 pack-year smoking history. She has never used smokeless tobacco. She reports that she does not drink alcohol or use illicit drugs.  Allergies:  Allergies  Allergen Reactions  . Metoclopramide Hcl Other (See Comments)     Tremors  . Penicillins Itching  . Aspirin Other (See Comments)    "gave me stomach aches; made me bleed" (03/13/12)  . Zetia [Ezetimibe] Other (See Comments)    Stomach and back pain   Medications: Patient's medications prior to admission were reviewed by me.  ROS: History obtained from the patient  General ROS: negative for - chills, fatigue, fever, night sweats, weight gain or weight loss Psychological ROS: negative for - behavioral disorder, hallucinations, memory difficulties, mood swings or suicidal ideation Ophthalmic ROS: negative for - blurry vision, double vision, eye pain or loss of vision ENT ROS: negative for - epistaxis, nasal discharge, oral lesions, sore throat, tinnitus or vertigo Allergy and Immunology  ROS: negative for - hives or itchy/watery eyes Hematological and Lymphatic ROS: negative for - bleeding problems, bruising or swollen lymph nodes Endocrine ROS: negative for - galactorrhea, hair pattern changes, polydipsia/polyuria or temperature intolerance Respiratory ROS: negative for - cough, hemoptysis, shortness of breath or wheezing Cardiovascular ROS: negative for - chest pain, dyspnea on exertion, edema  or irregular heartbeat Gastrointestinal ROS: negative for - abdominal pain, diarrhea, hematemesis, nausea/vomiting or stool incontinence Genito-Urinary ROS: negative for - dysuria, hematuria, incontinence or urinary frequency/urgency Musculoskeletal ROS: negative for - joint swelling or muscular weakness Neurological ROS: as noted in HPI Dermatological ROS: negative for rash and skin lesion changes  Physical Examination: Blood pressure 157/57, pulse 76, temperature 97.7 F (36.5 C), temperature source Oral, resp. rate 13, SpO2 95 %.  HEENT-  Normocephalic, no lesions, without obvious abnormality.  Normal external eye and conjunctiva.  Normal TM's bilaterally.  Normal auditory canals and external ears. Normal external nose, mucus membranes and septum.  Normal pharynx. Neck supple with no masses, nodes, nodules or enlargement. Cardiovascular - regular rate and rhythm, S1, S2 normal, no murmur, click, rub or gallop Lungs - chest clear, no wheezing, rales, normal symmetric air entry Abdomen - soft, non-tender; bowel sounds normal; no masses,  no organomegaly Extremities - no joint deformities, effusion, or inflammation, no edema and no skin discoloration Skin -   Neurologic Examination: Mental Status: Alert, oriented, thought content appropriate.  Speech fluent without evidence of aphasia. Able to follow commands without difficulty. Cranial Nerves: II-Visual fields were normal. III/IV/VI-Pupils were equal and reacted normally to light. Extraocular movements were full and conjugate.    V/VII-no facial numbness and no facial weakness. VIII-normal. X-normal speech and symmetrical palatal movement. XI: trapezius strength/neck flexion strength normal bilaterally XII-midline tongue extension with normal strength. Motor: 5/5 bilaterally with normal tone and bulk Sensory: Normal throughout. Deep Tendon Reflexes: 2+ and symmetric. Plantars: Flexor bilaterally Cerebellar: Normal finger-to-nose  testing. Carotid auscultation: Normal  Results for orders placed or performed during the hospital encounter of 06/18/14 (from the past 48 hour(s))  Ethanol     Status: None   Collection Time: 06/18/14  5:55 PM  Result Value Ref Range   Alcohol, Ethyl (B) <5 0 - 9 mg/dL    Comment:        LOWEST DETECTABLE LIMIT FOR SERUM ALCOHOL IS 11 mg/dL FOR MEDICAL PURPOSES ONLY   Protime-INR     Status: Abnormal   Collection Time: 06/18/14  5:55 PM  Result Value Ref Range   Prothrombin Time 19.5 (H) 11.6 - 15.2 seconds   INR 1.64 (H) 0.00 - 1.49  APTT     Status: Abnormal   Collection Time: 06/18/14  5:55 PM  Result Value Ref Range   aPTT 40 (H) 24 - 37 seconds    Comment:        IF BASELINE aPTT IS ELEVATED, SUGGEST PATIENT RISK ASSESSMENT BE USED TO DETERMINE APPROPRIATE ANTICOAGULANT THERAPY.   CBC     Status: Abnormal   Collection Time: 06/18/14  5:55 PM  Result Value Ref Range   WBC 6.6 4.0 - 10.5 K/uL   RBC 4.05 3.87 - 5.11 MIL/uL   Hemoglobin 9.9 (L) 12.0 - 15.0 g/dL   HCT 32.4 (L) 36.0 - 46.0 %   MCV 80.0 78.0 - 100.0 fL   MCH 24.4 (L) 26.0 - 34.0 pg   MCHC 30.6 30.0 - 36.0 g/dL   RDW 16.2 (H) 11.5 - 15.5 %   Platelets 242 150 -  400 K/uL  Differential     Status: None   Collection Time: 06/18/14  5:55 PM  Result Value Ref Range   Neutrophils Relative % 63 43 - 77 %   Neutro Abs 4.2 1.7 - 7.7 K/uL   Lymphocytes Relative 26 12 - 46 %   Lymphs Abs 1.7 0.7 - 4.0 K/uL   Monocytes Relative 9 3 - 12 %   Monocytes Absolute 0.6 0.1 - 1.0 K/uL   Eosinophils Relative 1 0 - 5 %   Eosinophils Absolute 0.1 0.0 - 0.7 K/uL   Basophils Relative 1 0 - 1 %   Basophils Absolute 0.0 0.0 - 0.1 K/uL  Comprehensive metabolic panel     Status: Abnormal   Collection Time: 06/18/14  5:55 PM  Result Value Ref Range   Sodium 138 135 - 145 mmol/L   Potassium 3.8 3.5 - 5.1 mmol/L   Chloride 103 96 - 112 mmol/L   CO2 30 19 - 32 mmol/L   Glucose, Bld 100 (H) 70 - 99 mg/dL   BUN 18 6 - 23  mg/dL   Creatinine, Ser 0.90 0.50 - 1.10 mg/dL   Calcium 9.6 8.4 - 10.5 mg/dL   Total Protein 6.8 6.0 - 8.3 g/dL   Albumin 3.8 3.5 - 5.2 g/dL   AST 20 0 - 37 U/L   ALT 11 0 - 35 U/L   Alkaline Phosphatase 125 (H) 39 - 117 U/L   Total Bilirubin 0.4 0.3 - 1.2 mg/dL   GFR calc non Af Amer 58 (L) >90 mL/min   GFR calc Af Amer 67 (L) >90 mL/min    Comment: (NOTE) The eGFR has been calculated using the CKD EPI equation. This calculation has not been validated in all clinical situations. eGFR's persistently <90 mL/min signify possible Chronic Kidney Disease.    Anion gap 5 5 - 15  I-Stat Troponin, ED (not at [facility])     Status: None   Collection Time: 06/18/14  6:06 PM  Result Value Ref Range   Troponin i, poc 0.01 0.00 - 0.08 ng/mL   Comment 3            Comment: Due to the release kinetics of cTnI, a negative result within the first hours of the onset of symptoms does not rule out myocardial infarction with certainty. If myocardial infarction is still suspected, repeat the test at appropriate intervals.   I-Stat Chem 8, ED     Status: Abnormal   Collection Time: 06/18/14  6:08 PM  Result Value Ref Range   Sodium 138 135 - 145 mmol/L   Potassium 3.8 3.5 - 5.1 mmol/L   Chloride 102 96 - 112 mmol/L   BUN 19 6 - 23 mg/dL   Creatinine, Ser 0.90 0.50 - 1.10 mg/dL   Glucose, Bld 101 (H) 70 - 99 mg/dL   Calcium, Ion 1.18 1.13 - 1.30 mmol/L   TCO2 24 0 - 100 mmol/L   Hemoglobin 11.2 (L) 12.0 - 15.0 g/dL   HCT 33.0 (L) 36.0 - 46.0 %  CBG monitoring, ED     Status: None   Collection Time: 06/18/14  6:17 PM  Result Value Ref Range   Glucose-Capillary 92 70 - 99 mg/dL   Comment 1 Notify RN    Comment 2 Documented in Char    Ct Head Wo Contrast  06/18/2014   CLINICAL DATA:  Code stroke, altered speech, confusion, expressive aphasia, history essential hypertension, stroke, atrial fibrillation  EXAM: CT HEAD WITHOUT  CONTRAST  TECHNIQUE: Contiguous axial images were obtained from the base  of the skull through the vertex without intravenous contrast.  COMPARISON:  03/31/2014  FINDINGS: Generalized atrophy.  Normal ventricular morphology.  No midline shift or mass effect.  Mild small vessel chronic ischemic changes of deep cerebral white matter.  Old LEFT cerebellar infarcts.  No intracranial hemorrhage, mass lesion, or evidence acute infarction.  Several tiny RIGHT hemisphere calcifications noted, unchanged.  No extra-axial fluid collections.  Bones demineralized.  No significant bone or sinus abnormalities.  Atherosclerotic calcifications of the carotid siphons.  IMPRESSION: Atrophy with small vessel chronic ischemic changes of deep cerebral white matter.  Old LEFT cerebral infarcts.  No acute intracranial abnormalities.  Findings called to Dr. Kathrynn Humble on 06/18/2014 at 1837 hours.   Electronically Signed   By: Lavonia Dana M.D.   On: 06/18/2014 18:40    Assessment: 81 y.o. female with multiple risk factors for stroke as well as previous TIAs, presenting with probable recurrent right subcortical MCA territory TIA. Small vessel subcortical stroke cannot be ruled out at this point, however.  Stroke Risk Factors - atrial fibrillation, hyperlipidemia and hypertension  Plan: 1. HgbA1c, fasting lipid panel 2. MRI, MRA  of the brain without contrast 3. PT consult, OT consult, Speech consult 4. Echocardiogram 5. Carotid dopplers 6. Prophylactic therapy-Anticoagulation: Xaralto 7. Risk factor modification 8. Telemetry monitoring  C.R. Nicole Kindred, MD [facility] [phone]  06/18/2014, 7:11 PM

## 2014-06-18 NOTE — ED Notes (Signed)
Per EMS: pt from home for eval of left sided facial droop, left sided weakness and apasia when pt awoke at 1600 today, pt laid down for a nap at 1400 and lsw at 1400. Pt with hx of TIA in the past, currently on xarelto at this time. NAD noted.

## 2014-06-18 NOTE — ED Notes (Signed)
Family at bedside and updated on pt plan of care.

## 2014-06-18 NOTE — ED Provider Notes (Signed)
CSN: [id number]     Arrival date & time 06/18/14  1752 History   First MD Initiated Contact with Patient 06/18/14 1803     Chief Complaint  Patient presents with  . Code Stroke    An emergency department physician performed an initial assessment on this suspected stroke patient at 1754. (Consider location/radiation/quality/duration/timing/severity/associated sxs/prior Treatment) HPI Comments: 81 y.o. female with past medical history of paroxysmal atrial fibrillation, strokes x 2, coronary artery disease, diastolic congestive heart failure presents to the ER with cc of L sided facial droop and difficulty speaking. Pt reports going to sleep at 2:30 pm, and waking up at 4 having some trouble speaking. Family noticed facial droop. EMS was called, who did witness intermittent droop and also drooling. Pt has no headaches, trauma, vision complains, chest pain.    ROS 10 Systems reviewed and are negative for acute change except as noted in the HPI.     The history is provided by the patient.    Past Medical History  Diagnosis Date  . Allergy, unspecified not elsewhere classified   . Other dyspnea and respiratory abnormality   . Other diseases of lung, not elsewhere classified   . Anemia, unspecified   . Unspecified adverse effect of other drug, medicinal and biological substance(995.29)   . Blood in stool   . Hemorrhage of gastrointestinal tract, unspecified   . Personal history of peptic ulcer disease   . Anxiety state, unspecified   . Personal history of other diseases of digestive system   . Unspecified essential hypertension   . Complication of anesthesia     "hard to wake me up after colonoscopy" (03/13/12)  . H/O hiatal hernia   . GERD (gastroesophageal reflux disease)   . Unspecified paranoid state   . Atrial fibrillation with rapid ventricular response 03/13/12  . Osteoarthrosis, unspecified whether generalized or localized, lower leg   . Degeneration of lumbar or  lumbosacral intervertebral disc   . Unspecified cerebral artery occlusion with cerebral infarction 10/2008    "mini stroke" denies residual (03/13/12)  . History of IBS    Past Surgical History  Procedure Laterality Date  . Pilonidal cyst / sinus excision  1957  . Dilation and curettage of uterus  1950's?  . Shoulder arthroscopy  ~ 2000    right  . Orif humerus fracture Right 04/03/2014    Procedure: OPEN REDUCTION INTERNAL FIXATION (ORIF) RIGHT HUMERUS DISTAL FRACTURE;  Surgeon: Roseanne Kaufman, MD;  Location: [place];  Service: Orthopedics;  Laterality: Right;   Family History  Problem Relation Age of Onset  . Diabetes Mother   . CVA Mother   . CVA Father    History  Substance Use Topics  . Smoking status: Former Smoker -- 1.00 packs/day for 40 years    Types: Cigarettes    Quit date: 05/02/2002  . Smokeless tobacco: Never Used  . Alcohol Use: No   OB History    No data available     Review of Systems  Eyes: Negative for visual disturbance.  Cardiovascular: Negative for chest pain.  Neurological: Positive for facial asymmetry, weakness and numbness. Negative for headaches.  All other systems reviewed and are negative.     Allergies  Metoclopramide hcl; Penicillins; Aspirin; and Zetia  Home Medications   Prior to Admission medications   Medication Sig Start Date End Date Taking? Authorizing Provider  ALPRAZolam Duanne Moron) 0.25 MG tablet Take 1/2 tablet by mouth every 3 hours as needed for anxiety 04/14/14   Janett Billow  Beaulah Corin, NP  Cholecalciferol (VITAMIN D3) 5000 UNITS CAPS Take 5,000 Units by mouth daily.    Historical Provider, MD  cloNIDine (CATAPRES) 0.1 MG tablet Take 1 tablet (0.1 mg total) by mouth every 2 (two) hours as needed (take as needed for a  Blood pressure that is greater than 160 on top, or bottom number greater than 100.). 12/04/12   Neena Rhymes, MD  DILTIAZEM HCL ER PO Take 180 mg by mouth 2 (two) times daily. Before meals    Historical Provider, MD   furosemide (LASIX) 40 MG tablet Take 1 tablet (40 mg total) by mouth daily. 01/31/14   Larey Dresser, MD  HYDROcodone-acetaminophen [facility]) 7.5-325 MG per tablet Take one tablet by mouth every 6 hours as needed for moderate pain 04/08/14   Estill Dooms, MD  metoprolol succinate (TOPROL-XL) 50 MG 24 hr tablet TAKE 1 TABLET (50 MG TOTAL) BY MOUTH 2 (TWO) TIMES DAILY. TAKE WITH OR IMMEDIATELY FOLLOWING A MEAL. 03/25/14   Larey Dresser, MD  Multiple Vitamins-Minerals (CENTRUM SILVER PO) Take 1 tablet by mouth daily.    Historical Provider, MD  pantoprazole (PROTONIX) 40 MG tablet TAKE 1 TABLET EVERY MORNING    Hendricks Limes, MD  potassium chloride SA (K-DUR,KLOR-CON) 20 MEQ tablet TAKE 1 TABLET EVERY DAY Patient taking differently: TAKE 1 TABLET EVERY DAY + pt is taking 10 meq daily for a toyal of 30 meq daily 04/18/13   Neena Rhymes, MD  XARELTO 15 MG TABS tablet TAKE 1 TABLET EVERY DAY 03/19/14   Larey Dresser, MD   BP 155/57 mmHg  Pulse 89  Temp(Src) 97.7 F (36.5 C) (Oral)  Resp 19  SpO2 94% Physical Exam  Constitutional: She is oriented to person, place, and time. She appears well-developed and well-nourished.  HENT:  Head: Normocephalic and atraumatic.  Eyes: EOM are normal. Pupils are equal, round, and reactive to light.  Neck: Neck supple.  Cardiovascular: Normal rate, regular rhythm and normal heart sounds.   No murmur heard. Pulmonary/Chest: Effort normal. No respiratory distress.  Abdominal: Soft. She exhibits no distension. There is no tenderness. There is no rebound and no guarding.  Neurological: She is alert and oriented to person, place, and time. No cranial nerve deficit. Coordination normal.  Cerebellar exam is normal (finger to nose) Sensory exam normal for bilateral upper and lower extremities - and patient is able to discriminate between sharp and dull. Motor exam is 4+/5 Questionable pressure speech  Skin: Skin is warm and dry.  Nursing note and vitals  reviewed.   ED Course  Procedures (including critical care time) Labs Review Labs Reviewed  PROTIME-INR - Abnormal; Notable for the following:    Prothrombin Time 19.5 (*)    INR 1.64 (*)    All other components within normal limits  APTT - Abnormal; Notable for the following:    aPTT 40 (*)    All other components within normal limits  CBC - Abnormal; Notable for the following:    Hemoglobin 9.9 (*)    HCT 32.4 (*)    MCH 24.4 (*)    RDW 16.2 (*)    All other components within normal limits  I-STAT CHEM 8, ED - Abnormal; Notable for the following:    Glucose, Bld 101 (*)    Hemoglobin 11.2 (*)    HCT 33.0 (*)    All other components within normal limits  DIFFERENTIAL  ETHANOL  COMPREHENSIVE METABOLIC PANEL  URINE RAPID DRUG  SCREEN (HOSP PERFORMED)  URINALYSIS, ROUTINE W REFLEX MICROSCOPIC  I-STAT TROPOININ, ED  I-STAT TROPOININ, ED  CBG MONITORING, ED    Imaging Review No results found.   EKG Interpretation   Date/Time:  Wednesday June 18 2014 18:08:21 EST Ventricular Rate:  81 PR Interval:  171 QRS Duration: 87 QT Interval:  402 QTC Calculation: 467 R Axis:   -7 Text Interpretation:  Sinus rhythm Probable left atrial enlargement No  acute changes Confirmed by Kathrynn Humble, MD, Thelma Comp [phone]) on 06/18/2014  6:31:13 PM      MDM   Final diagnoses:  Dysarthria  Facial droop  Transient cerebral ischemia, unspecified transient cerebral ischemia type   Pt comes in with cc of L sided drooping, weakness, slurred speech. Hx of strokes. Symptoms are evolving and waxing + waning.  CODE STROKE was activated and stroke team at bedside. I noticed a bit of pressure speech on my exam.  NIHSS per neuro is 0, and i agree with that as well.  Likely TIA. CT head is neg for acute pathology. Not a TPA candidate, out of window, and no significant deficits. Will admit.      Varney Biles, MD 06/18/14 [phone]

## 2014-06-18 NOTE — H&P (Signed)
[facility] History and Physical  Jacqueline [name] ASN:[id number] DOB: [birth year] DOA: 06/18/2014  Referring physician: EDP PCP: [name], MD   Chief Complaint: L facial droop   HPI: Jacqueline [name] is a 81 y.o. female who presents to the ED after acute onset of slurred speech and L facial droop.  She has a history of cerebellar stroke a couple of years back, PAF, is on chronic Xarelto for this.  Symptoms onset at 2:30 pm today, have been waxing and waning since onset, were initially resolved on presentation to ED but now the facial droop symptoms have returned.  Review of Systems: Systems reviewed.  As above, otherwise negative  Past Medical History  Diagnosis Date  . Allergy, unspecified not elsewhere classified   . Other dyspnea and respiratory abnormality   . Other diseases of lung, not elsewhere classified   . Anemia, unspecified   . Unspecified adverse effect of other drug, medicinal and biological substance(995.29)   . Blood in stool   . Hemorrhage of gastrointestinal tract, unspecified   . Personal history of peptic ulcer disease   . Anxiety state, unspecified   . Personal history of other diseases of digestive system   . Unspecified essential hypertension   . Complication of anesthesia     "hard to wake me up after colonoscopy" (03/13/12)  . H/O hiatal hernia   . GERD (gastroesophageal reflux disease)   . Unspecified paranoid state   . Atrial fibrillation with rapid ventricular response 03/13/12  . Osteoarthrosis, unspecified whether generalized or localized, lower leg   . Degeneration of lumbar or lumbosacral intervertebral disc   . Unspecified cerebral artery occlusion with cerebral infarction 10/2008    "mini stroke" denies residual (03/13/12)  . History of IBS    Past Surgical History  Procedure Laterality Date  . Pilonidal cyst / sinus excision  1957  . Dilation and curettage of uterus  1950's?  . Shoulder arthroscopy  ~ 2000    right  . Orif humerus fracture  Right 04/03/2014    Procedure: OPEN REDUCTION INTERNAL FIXATION (ORIF) RIGHT HUMERUS DISTAL FRACTURE;  Surgeon: Roseanne Kaufman, MD;  Location: [place];  Service: Orthopedics;  Laterality: Right;   Social History:  reports that she quit smoking about 12 years ago. Her smoking use included Cigarettes. She has a 40 pack-year smoking history. She has never used smokeless tobacco. She reports that she does not drink alcohol or use illicit drugs.  Allergies  Allergen Reactions  . Metoclopramide Hcl Other (See Comments)     Tremors  . Penicillins Itching  . Aspirin Other (See Comments)    "gave me stomach aches; made me bleed" (03/13/12)  . Zetia [Ezetimibe] Other (See Comments)    Stomach and back pain    Family History  Problem Relation Age of Onset  . Diabetes Mother   . CVA Mother   . CVA Father      Prior to Admission medications   Medication Sig Start Date End Date Taking? Authorizing Provider  ALPRAZolam Duanne Moron) 0.25 MG tablet Take 1/2 tablet by mouth every 3 hours as needed for anxiety 04/14/14   Lauree Chandler, NP  Cholecalciferol (VITAMIN D3) 5000 UNITS CAPS Take 5,000 Units by mouth daily.    Historical Provider, MD  cloNIDine (CATAPRES) 0.1 MG tablet Take 1 tablet (0.1 mg total) by mouth every 2 (two) hours as needed (take as needed for a  Blood pressure that is greater than 160 on top, or bottom number greater than 100.).  12/04/12   Neena Rhymes, MD  DILTIAZEM HCL ER PO Take 180 mg by mouth 2 (two) times daily. Before meals    Historical Provider, MD  furosemide (LASIX) 40 MG tablet Take 1 tablet (40 mg total) by mouth daily. 01/31/14   Larey Dresser, MD  HYDROcodone-acetaminophen [facility]) 7.5-325 MG per tablet Take one tablet by mouth every 6 hours as needed for moderate pain 04/08/14   Estill Dooms, MD  metoprolol succinate (TOPROL-XL) 50 MG 24 hr tablet TAKE 1 TABLET (50 MG TOTAL) BY MOUTH 2 (TWO) TIMES DAILY. TAKE WITH OR IMMEDIATELY FOLLOWING A MEAL. 03/25/14   Larey Dresser, MD  Multiple Vitamins-Minerals (CENTRUM SILVER PO) Take 1 tablet by mouth daily.    Historical Provider, MD  pantoprazole (PROTONIX) 40 MG tablet TAKE 1 TABLET EVERY MORNING    Hendricks Limes, MD  potassium chloride SA (K-DUR,KLOR-CON) 20 MEQ tablet TAKE 1 TABLET EVERY DAY Patient taking differently: TAKE 1 TABLET EVERY DAY + pt is taking 10 meq daily for a toyal of 30 meq daily 04/18/13   Neena Rhymes, MD  XARELTO 15 MG TABS tablet TAKE 1 TABLET EVERY DAY 03/19/14   Larey Dresser, MD   Physical Exam: Filed Vitals:   06/18/14 2000  BP: 142/51  Pulse: 85  Temp:   Resp: 16    BP 142/51 mmHg  Pulse 85  Temp(Src) 97.7 F (36.5 C) (Oral)  Resp 16  SpO2 99%  General Appearance:    Alert, oriented, no distress, appears stated age  Head:    Normocephalic, atraumatic  Eyes:    PERRL, EOMI, sclera non-icteric        Nose:   Nares without drainage or epistaxis. Mucosa, turbinates normal  Throat:   Moist mucous membranes. Oropharynx without erythema or exudate.  Neck:   Supple. No carotid bruits.  No thyromegaly.  No lymphadenopathy.   Back:     No CVA tenderness, no spinal tenderness  Lungs:     Clear to auscultation bilaterally, without wheezes, rhonchi or rales  Chest wall:    No tenderness to palpitation  Heart:    Regular rate and rhythm without murmurs, gallops, rubs  Abdomen:     Soft, non-tender, nondistended, normal bowel sounds, no organomegaly  Genitalia:    deferred  Rectal:    deferred  Extremities:   No clubbing, cyanosis or edema.  Pulses:   2+ and symmetric all extremities  Skin:   Skin color, texture, turgor normal, no rashes or lesions  Lymph nodes:   Cervical, supraclavicular, and axillary nodes normal  Neurologic:   Speech mostly clear, does have left sided facial droop, LUE is slightly weak compared to the right although patient states she dosent notice any weakness.    Labs on Admission:  Basic Metabolic Panel:  Recent Labs Lab 06/18/14 1755  06/18/14 1808  NA 138 138  K 3.8 3.8  CL 103 102  CO2 30  --   GLUCOSE 100* 101*  BUN 18 19  CREATININE 0.90 0.90  CALCIUM 9.6  --    Liver Function Tests:  Recent Labs Lab 06/18/14 1755  AST 20  ALT 11  ALKPHOS 125*  BILITOT 0.4  PROT 6.8  ALBUMIN 3.8   No results for input(s): LIPASE, AMYLASE in the last 168 hours. No results for input(s): AMMONIA in the last 168 hours. CBC:  Recent Labs Lab 06/18/14 1755 06/18/14 1808  WBC 6.6  --   NEUTROABS 4.2  --  HGB 9.9* 11.2*  HCT 32.4* 33.0*  MCV 80.0  --   PLT 242  --    Cardiac Enzymes: No results for input(s): CKTOTAL, CKMB, CKMBINDEX, TROPONINI in the last 168 hours.  BNP (last 3 results) No results for input(s): PROBNP in the last 8760 hours. CBG:  Recent Labs Lab 06/18/14 1817  GLUCAP 92    Radiological Exams on Admission: Ct Head Wo Contrast  06/18/2014   CLINICAL DATA:  Code stroke, altered speech, confusion, expressive aphasia, history essential hypertension, stroke, atrial fibrillation  EXAM: CT HEAD WITHOUT CONTRAST  TECHNIQUE: Contiguous axial images were obtained from the base of the skull through the vertex without intravenous contrast.  COMPARISON:  03/31/2014  FINDINGS: Generalized atrophy.  Normal ventricular morphology.  No midline shift or mass effect.  Mild small vessel chronic ischemic changes of deep cerebral white matter.  Old LEFT cerebellar infarcts.  No intracranial hemorrhage, mass lesion, or evidence acute infarction.  Several tiny RIGHT hemisphere calcifications noted, unchanged.  No extra-axial fluid collections.  Bones demineralized.  No significant bone or sinus abnormalities.  Atherosclerotic calcifications of the carotid siphons.  IMPRESSION: Atrophy with small vessel chronic ischemic changes of deep cerebral white matter.  Old LEFT cerebral infarcts.  No acute intracranial abnormalities.  Findings called to Dr. Kathrynn Humble on 06/18/2014 at 1837 hours.   Electronically Signed   By: Lavonia Dana M.D.   On: 06/18/2014 18:40    EKG: Independently reviewed.  Assessment/Plan Principal Problem:   Acute ischemic right MCA stroke Active Problems:   Essential hypertension   Atrial fibrillation with RVR   1. Probable R MCA acute ischemic stroke - waxing and waning symptoms, patient not candidate for TPA due to anticoagulation with xarelto, minimal deficits, and time since onset. 1. Stroke work up pathway 2. MRI to confirm 3. PT/OT 4. Advance diet as tolerated 2. HTN - Will hold clonadine and beta blocker, continue dilt for A.Fib rate control, holding lasix for now 3. A.Fib - continue xarelto and dilt, add back beta blocker if becomes un rate controlled    Code Status: Full Code  Family Communication: Family at bedside Disposition Plan: Admit to inpatient   Time spent: 70 min  Samik Balkcom M. [facility] Pager [phone]  If 7AM-7PM, please contact the day team taking care of the patient [email or web address] Password [facility] 06/18/2014, 8:36 PM

## 2014-06-19 DIAGNOSIS — I63411 Cerebral infarction due to embolism of right middle cerebral artery: Secondary | ICD-10-CM | POA: Insufficient documentation

## 2014-06-19 DIAGNOSIS — I6789 Other cerebrovascular disease: Secondary | ICD-10-CM

## 2014-06-19 DIAGNOSIS — E785 Hyperlipidemia, unspecified: Secondary | ICD-10-CM | POA: Insufficient documentation

## 2014-06-19 LAB — LIPID PANEL
CHOL/HDL RATIO: 2.7 ratio
Cholesterol: 169 mg/dL (ref 0–200)
HDL: 62 mg/dL (ref >39)
LDL CALC: 93 mg/dL (ref 0–99)
Triglycerides: 71 mg/dL (ref <150)
VLDL: 14 mg/dL (ref 0–40)

## 2014-06-19 LAB — GLUCOSE, CAPILLARY: GLUCOSE-CAPILLARY: 114 mg/dL — AB (ref 70–99)

## 2014-06-19 MED ORDER — APIXABAN 5 MG PO TABS
5.0000 mg | ORAL_TABLET | Freq: Two times a day (BID) | ORAL | Status: DC
  Administered 2014-06-20 – 2014-06-21 (×3): 5 mg via ORAL
  Filled 2014-06-19 (×3): qty 1
  Filled 2014-06-19: qty 2

## 2014-06-19 MED ORDER — METOPROLOL SUCCINATE ER 25 MG PO TB24
50.0000 mg | ORAL_TABLET | Freq: Every day | ORAL | Status: DC
  Administered 2014-06-19 – 2014-06-21 (×3): 50 mg via ORAL
  Filled 2014-06-19 (×3): qty 2

## 2014-06-19 MED ORDER — ATORVASTATIN CALCIUM 10 MG PO TABS
20.0000 mg | ORAL_TABLET | Freq: Every day | ORAL | Status: DC
  Administered 2014-06-19 – 2014-06-20 (×2): 20 mg via ORAL
  Filled 2014-06-19 (×2): qty 2

## 2014-06-19 NOTE — Progress Notes (Signed)
VASCULAR LAB PRELIMINARY  PRELIMINARY  PRELIMINARY  PRELIMINARY  Carotid Dopplers completed.    Preliminary report:  1-39% ICA stenosis.  Vertebral artery flow is antegrade.   Idil Maslanka, RVT 06/19/2014, 9:25 AM

## 2014-06-19 NOTE — Progress Notes (Signed)
Occupational Therapy Evaluation Patient Details Name: Jacqueline [name] MRN: [id number] DOB: February 05, 1929 Today's Date: 06/19/2014    History of Present Illness 81 yo female admitted after speech difficulties and facial droop at home found to have scattered small R MCA infarcts. pt with recent stay at [facility] after fall at [facility] with r humerus fx s/p ORIF 12/3. PMHx for anxiety, HTN, Afib, OA, DDD   Clinical Impression   PTA, pt living at home @ mod I level with use of rollator and was receiving [place] services. Pt states she had only been home 1.5 weeks after rehab. Pt progressing well. At this time, feel pt should have close to 24/7 S if she wants to D/C home. If this amount of S is not available, feel pt will need SNF. Pt is an excellent candidate for ALF and could D/C to ALF if family has that arranged. Will follow acutely to address established goals and facilitate D/C to next venue of care.    Follow Up Recommendations  Supervision/Assistance - 24 hour;SNF    Equipment Recommendations  None recommended by OT    Recommendations for Other Services       Precautions / Restrictions Precautions Precautions: Fall Precaution Comments: pt states still not to have extensive weight bearing on RUE Restrictions Weight Bearing Restrictions: No      Mobility Bed Mobility Overal bed mobility: Needs Assistance Bed Mobility: Supine to Sit;Sit to Supine     Supine to sit: Min assist Sit to supine: Supervision      Transfers Overall transfer level: Needs assistance Equipment used: Rolling walker (2 wheeled) Transfers: Sit to/from Stand Sit to Stand: Min guard         General transfer comment: cues for hand placement, sequence and safety Pt unable to return demonstrate cues for safe positioning in RW, however, limited due to kyphosis   Balance Overall balance assessment: Needs assistance Sitting-balance support: Feet supported Sitting balance-Leahy Scale: Fair     Standing balance  support: During functional activity Standing balance-Leahy Scale: Fair                              ADL Overall ADL's : Needs assistance/impaired Eating/Feeding: Supervision/ safety Eating/Feeding Details (indicate cue type and reason): speech note for watch for pocketing L cheek Grooming: Set up   Upper Body Bathing: Minimal assitance;Sitting   Lower Body Bathing: Minimal assistance   Upper Body Dressing : Minimal assistance   Lower Body Dressing: Minimal assistance   Toilet Transfer: Min guard   Toileting- Clothing Manipulation and Hygiene: Min guard;Sit to/from stand       Functional mobility during ADLs: Minimal assistance General ADL Comments: Pt unsafe with RW use; however, pt states she uses 4 wheeled RW at home.     Vision  Pt states she had visual deficits from prior CVA. "That's better now"   Perception     Praxis Praxis Praxis tested?: Within functional limits    Pertinent Vitals/Pain Pain Assessment: No/denies pain     Hand Dominance Right   Extremity/Trunk Assessment Upper Extremity Assessment Upper Extremity Assessment: RUE deficits/detail;LUE deficits/detail RUE Deficits / Details: shoulder deficits at baseline @ 60FF. limited ER to 30. All other ROM WFL RUE Coordination: decreased gross motor LUE Deficits / Details: L shoulder deficits at baseline. @ 45 degrees FF and limited ER to @ 20. uses functionally. LUE Coordination: decreased gross motor   Lower Extremity Assessment Lower Extremity Assessment: Generalized  weakness   Cervical / Trunk Assessment Cervical / Trunk Assessment: Kyphotic   Communication Communication Communication: No difficulties (son reports speech is slightly slower than normal)   Cognition Arousal/Alertness: Awake/alert Behavior During Therapy: WFL for tasks assessed/performed Overall Cognitive Status: No family/caregiver present to determine baseline cognitive functioning           Safety/Judgement:  Decreased awareness of safety;Decreased awareness of deficits                            Home Living Family/patient expects to be discharged to:: Private residence Living Arrangements: Alone Available Help at Discharge: Family;Available PRN/intermittently Type of Home: House Home Access: Level entry     Home Layout: One level     Bathroom Shower/Tub: Occupational psychologist: Standard Bathroom Accessibility: Yes How Accessible: Accessible via walker Home Equipment: [place] - 4 wheels;Bedside commode;Shower seat      Lives With: Alone    Prior Functioning/Environment Level of Independence: Independent with assistive device(s) (uses rollator)        Comments: Daughter drives to get her groceries, she heats up food does not prepare. Pt was receiving HHPT and HHOT, sponge bathes    OT Diagnosis: Generalized weakness   OT Problem List: Decreased strength;Decreased range of motion;Decreased activity tolerance;Impaired balance (sitting and/or standing);Decreased knowledge of use of DME or AE;Decreased safety awareness;Impaired UE functional use;Obesity   OT Treatment/Interventions: Self-care/ADL training;Therapeutic exercise;Therapeutic activities;Patient/family education;Cognitive remediation/compensation;Balance training;DME and/or AE instruction    OT Goals(Current goals can be found in the care plan section) Acute Rehab OT Goals Patient Stated Goal: return home OT Goal Formulation: With patient Time For Goal Achievement: 07/03/14 Potential to Achieve Goals: Good ADL Goals Pt Will Perform Lower Body Bathing: with set-up;sit to/from stand Pt Will Perform Lower Body Dressing: with set-up;sit to/from stand Pt Will Transfer to Toilet: with modified independence;ambulating;grab bars;bedside commode Pt Will Perform Toileting - Clothing Manipulation and hygiene: with modified independence;sit to/from stand  OT Frequency: Min 2X/week   Barriers to D/C:             Co-evaluation              End of Session Equipment Utilized During Treatment: Gait belt;Rolling walker Nurse Communication: Mobility status  Activity Tolerance: Patient tolerated treatment well Patient left: in bed;with call bell/phone within reach;with bed alarm set   Time: 2426-8341 OT Time Calculation (min): 28 min Charges:  OT General Charges $OT Visit: 1 Procedure OT Evaluation $Initial OT Evaluation Tier I: 1 Procedure OT Treatments $Self Care/Home Management : 8-22 mins G-Codes:    Saif Peter,HILLARY 07-15-2014, 3:35 PM   [facility], OTR/L  [phone] Jul 15, 2014

## 2014-06-19 NOTE — Progress Notes (Signed)
STROKE TEAM PROGRESS NOTE   HISTORY Jacqueline [name] is an 81 y.o. female with a history of hypertension, hyperlipidemia, TIAs and atrial fibrillation who was brought to the emergency room and code stroke status following acute onset of left facial droop and slurred speech. She was last known well at 2:30 PM today. She's been on anticoagulation with [name]. CT scan of her head showed no acute intracranial abnormality. Old left cerebral infarctions were noted. Deficits were improving when she arrived in the emergency room and subsequently cleared. NIH stroke score was 0. She had no focal weakness nor numbness. She also had no visual changes.  LSN: 2:30 PM on 06/18/2014 tPA Given: No: Deficits resolved mRankin:   SUBJECTIVE (INTERVAL HISTORY) The patient's 2 sons were at the bedside. They feel that the patient's speech and facial droop are both improving. Anticoagulation was discussed in detail. The patient had been on Xarelto 20 mg daily in the past; however, she developed bruising and the dose was decreased to 15 mg daily. [name] recommended switching to Eliquis 5 mg twice a day. The patient and her family are in agreement. Will change to Eliquis tomorrow. The patient had rectal bleeding approximately 3 years ago felt secondary to Celebrex therapy. [name] reviewed the MRI images with the patient and her family. He will send a note to [name] regarding the change in anticoagulant.   OBJECTIVE Temp:  [97.7 F (36.5 C)-99.1 F (37.3 C)] 98.6 F (37 C) (02/18 2109) Pulse Rate:  [88-110] 88 (02/18 2109) Cardiac Rhythm:  [-] Sinus tachycardia (02/18 0800) Resp:  [16-18] 18 (02/18 2109) BP: (114-156)/(51-91) 121/55 mmHg (02/18 2109) SpO2:  [94 %-98 %] 96 % (02/18 2109) Weight:  [145 lb (65.772 kg)] 145 lb (65.772 kg) (02/18 1841)   Recent Labs Lab 06/18/14 1817 06/19/14 2015  GLUCAP 92 114*    Recent Labs Lab 06/18/14 1755 06/18/14 1808  NA 138 138  K 3.8 3.8  CL 103 102  CO2 30   --   GLUCOSE 100* 101*  BUN 18 19  CREATININE 0.90 0.90  CALCIUM 9.6  --     Recent Labs Lab 06/18/14 1755  AST 20  ALT 11  ALKPHOS 125*  BILITOT 0.4  PROT 6.8  ALBUMIN 3.8    Recent Labs Lab 06/18/14 1755 06/18/14 1808  WBC 6.6  --   NEUTROABS 4.2  --   HGB 9.9* 11.2*  HCT 32.4* 33.0*  MCV 80.0  --   PLT 242  --    No results for input(s): CKTOTAL, CKMB, CKMBINDEX, TROPONINI in the last 168 hours.  Recent Labs  06/18/14 1755  LABPROT 19.5*  INR 1.64*    Recent Labs  06/18/14 1950  COLORURINE YELLOW  LABSPEC 1.017  PHURINE 5.5  GLUCOSEU NEGATIVE  HGBUR NEGATIVE  BILIRUBINUR NEGATIVE  KETONESUR NEGATIVE  PROTEINUR NEGATIVE  UROBILINOGEN 0.2  NITRITE NEGATIVE  LEUKOCYTESUR SMALL*       Component Value Date/Time   CHOL 169 06/19/2014 0835   TRIG 71 06/19/2014 0835   HDL 62 06/19/2014 0835   CHOLHDL 2.7 06/19/2014 0835   VLDL 14 06/19/2014 0835   LDLCALC 93 06/19/2014 0835   Lab Results  Component Value Date   HGBA1C 5.3 11/27/2012      Component Value Date/Time   LABOPIA POSITIVE* 06/18/2014 1950   COCAINSCRNUR NONE DETECTED 06/18/2014 1950   LABBENZ POSITIVE* 06/18/2014 1950   AMPHETMU NONE DETECTED 06/18/2014 1950   THCU NONE DETECTED 06/18/2014 1950  LABBARB NONE DETECTED 06/18/2014 1950     Recent Labs Lab 06/18/14 [place] <5   I have personally reviewed the radiological images below and agree with the radiology interpretations.  2-D echocardiogram 06/19/2014 Study Conclusions - Left ventricle: The cavity size was normal. There was severe focal basal and mild concentric hypertrophy. Systolic function was normal. The estimated ejection fraction was in the range of 55% to 60%. Wall motion was normal; there were no regional wall motion abnormalities. There was an increased relative contribution of atrial contraction to ventricular filling. Doppler parameters are consistent with abnormal left  ventricular relaxation (grade 1 diastolic dysfunction). - Aortic valve: Moderate thickening and calcification, consistent with sclerosis. - Mitral valve: Calcified annulus. Moderate focal thickening and calcification. There was mild regurgitation. - Left atrium: The atrium was severely dilated. - Pulmonary arteries: PA peak pressure: 49 mm Hg (S). Impressions: - The right ventricular systolic pressure was increased consistent with moderate pulmonary hypertension.  Ct Head Wo Contrast 06/18/2014    Atrophy with small vessel chronic ischemic changes of deep cerebral white matter.  Old LEFT cerebral infarcts.  No acute intracranial abnormalities.    Mr Brain Wo Contrast 06/19/2014    MRI HEAD IMPRESSION:   1. Few small scattered cortical ischemic infarcts involving the right MCA distribution as above. No associated hemorrhage or significant mass effect.  2. Remote bilateral cerebellar infarcts.  3. Advanced cerebral atrophy with chronic microvascular ischemic disease.   MRA HEAD IMPRESSION:   1. No proximal branch occlusion or hemodynamically significant stenosis identified within the intracranial circulation.  2. Multi focal atheromatous irregularity involving the MCAs and ACAs as well as the cavernous carotid arteries bilaterally without significant stenosis or occlusion.     CUS - Bilateral: 1-39% ICA stenosis. Vertebral artery flow is antegrade.   PHYSICAL EXAM  Temp:  [97.7 F (36.5 C)-99.1 F (37.3 C)] 98.6 F (37 C) (02/18 2109) Pulse Rate:  [88-110] 88 (02/18 2109) Resp:  [16-18] 18 (02/18 2109) BP: (114-156)/(51-91) 121/55 mmHg (02/18 2109) SpO2:  [94 %-98 %] 96 % (02/18 2109) Weight:  [145 lb (65.772 kg)] 145 lb (65.772 kg) (02/18 1841)  General - Well nourished, well developed, in no apparent distress.  Ophthalmologic - fundi not visualized due to incorportation.  Cardiovascular - Regular rate and rhythm with no murmur.  Mental Status -  Level of arousal  and orientation to time, place, and person were intact. Language including expression, naming, repetition, comprehension was assessed and found intact.  Cranial Nerves II - XII - II - Visual field intact OU. III, IV, VI - Extraocular movements intact. V - Facial sensation intact bilaterally. VII - Facial movement intact bilaterally. VIII - Hearing & vestibular intact bilaterally. X - Palate elevates symmetrically. XI - Chin turning & shoulder shrug intact bilaterally. XII - Tongue protrusion intact.  Motor Strength - The patient's strength was 5-/5 in all extremities and pronator drift was absent.  Bulk was normal and fasciculations were absent.   Motor Tone - Muscle tone was assessed at the neck and appendages and was normal.  Reflexes - The patient's reflexes were 1+ in all extremities and she had no pathological reflexes.  Sensory - Light touch, temperature/pinprick were assessed and were normal    Coordination - The patient had normal movements in the hands with no ataxia or dysmetria.  Tremor was absent.  Gait and Station - walk with walker, slow cautious gait, stooped posturing.   ASSESSMENT/PLAN Ms. Angeleena Dueitt is a 81  y.o. female with history of atrial fibrillation on Xarelto, hyperlipidemia, previous GI bleeding, hypertension, and previous strokes presenting with acute onset of left facial droop and slurred speech. She did not receive IV t-PA due to improvement in her deficits.   Stroke:  Non-dominant right MCA multiple patchy punctate infarct, embolic secondary to atrial fibrillation. She is on Xarelto but on low dose at 15mg  daily. Felt to be less adequate dose. Recommend to switch to eliquis with full dosing.  Resultant  Deficits resolved  MRI  Right MCA territory patchy punctate infarcts  MRA  Diffuse athero  Carotid Doppler  unremarkable  2D Echo - EF 55-60%. No cardiac source of emboli identified.  LDL 93  HgbA1c pending  Xarelto for VTE prophylaxis.  Diet  regular with thin liquids  xarelto ( rivaroxaban) prior to admission, now on eliquis (apixaban). Recommend switch to eliquis 5mg  bid.   Patient counseled to be compliant with her antithrombotic medications  Ongoing aggressive stroke risk factor management  Therapy recommendations:  No f/u PT recommended. 24 hour per day supervision/assistance versus SNF  Disposition:  Pending  Afib  On Xarelto 15mg  daily  Switch to eliquis full dose 5mg  bid  Watch for bleeding, especially GI bleeding  Pt and family in agreement.  Continue metoprolol and cardizem for rate control.  Hypertension  Home meds:  Catapres 0.1 mg twice daily, diltiazem 180 mg twice daily and Toprol-XL 50 mg daily  Resumed on admission  Stable  Hyperlipidemia  Home meds: No lipid lowering medications prior to admission  LDL 93, goal < 70  Add Lipitor 20 mg daily  Continue statin at discharge  Other Stroke Risk Factors  Advanced age  Cigarette smoker, quit smoking 12 years ago   Hx stroke/TIA  Family hx stroke (both parents)  Other Active Problems  Mild anemia  Other Pertinent History  Moderate pulmonary hypertension by 2-D echo  Hospital day # [place] PA-C [facility] Pager [phone] 06/19/2014, 9:34 PM  I, the attending vascular neurologist, have personally obtained a history, examined the patient, evaluated laboratory data, individually viewed imaging studies and agree with radiology interpretations. I also obtained additional history from pt's sons at bedside. Together with the NP/PA, we formulated the assessment and plan of care which reflects our mutual decision.  I have made any additions or clarifications directly to the above note and agree with the findings and plan as currently documented.   81 yo F with PMH of afib on Xarelto, HTN, HLD, GIB and hx of stroke admitted for right MCA embolic stroke. Symptoms resolved. The stroke likely due to afib with low  dose Xarelto. Will recommend to switch to eliquis full dose for further protection. LDL 93 and add lipitor. Family and pt in agreement. Stroke risk factor modification.  Neurology will sign off. Please call with questions. Pt will follow up with [name] at [facility] in about 2 months. Thanks for the consult.  Rosalin Hawking, MD PhD Stroke Neurology 06/19/2014 9:39 PM   To contact Stroke Continuity provider, please refer to [email or web address] After hours, contact General Neurology

## 2014-06-19 NOTE — Progress Notes (Signed)
Pt arrived to unit via stretcher from the ED.  Patients family at bedside.  Pt oriented to unit and plan of care.  Pt placed on telemetry and Central Tele notified.  Vitals and assessment stable.  Call bell within reach and bed alarm activated.

## 2014-06-19 NOTE — Progress Notes (Signed)
  Echocardiogram 2D Echocardiogram has been performed.  Lysle Rubens 06/19/2014, 1:53 PM

## 2014-06-19 NOTE — Clinical Social Work Note (Signed)
Clinical Social Worker attempted to meet with patient to complete psychosocial assessment. Patient is currently being evaluated by OT.   CSW continues to follow.   Glendon Axe, MSW, LCSWA [phone] 06/19/2014 2:50 PM

## 2014-06-19 NOTE — Evaluation (Signed)
Clinical/Bedside Swallow Evaluation Patient Details  Name: Jacqueline [name] MRN: [id number] Date of Birth: 1928-12-26  Today's Date: 06/19/2014 Time: SLP Start Time (ACUTE ONLY): 23 SLP Stop Time (ACUTE ONLY): 1120 SLP Time Calculation (min) (ACUTE ONLY): 17 min  Past Medical History:  Past Medical History  Diagnosis Date  . Allergy, unspecified not elsewhere classified   . Other dyspnea and respiratory abnormality   . Other diseases of lung, not elsewhere classified   . Anemia, unspecified   . Unspecified adverse effect of other drug, medicinal and biological substance(995.29)   . Blood in stool   . Hemorrhage of gastrointestinal tract, unspecified   . Personal history of peptic ulcer disease   . Anxiety state, unspecified   . Personal history of other diseases of digestive system   . Unspecified essential hypertension   . Complication of anesthesia     "hard to wake me up after colonoscopy" (03/13/12)  . H/O hiatal hernia   . GERD (gastroesophageal reflux disease)   . Unspecified paranoid state   . Atrial fibrillation with rapid ventricular response 03/13/12  . Osteoarthrosis, unspecified whether generalized or localized, lower leg   . Degeneration of lumbar or lumbosacral intervertebral disc   . Unspecified cerebral artery occlusion with cerebral infarction 10/2008    "mini stroke" denies residual (03/13/12)  . History of IBS    Past Surgical History:  Past Surgical History  Procedure Laterality Date  . Pilonidal cyst / sinus excision  1957  . Dilation and curettage of uterus  1950's?  . Shoulder arthroscopy  ~ 2000    right  . Orif humerus fracture Right 04/03/2014    Procedure: OPEN REDUCTION INTERNAL FIXATION (ORIF) RIGHT HUMERUS DISTAL FRACTURE;  Surgeon: Roseanne Kaufman, MD;  Location: [place];  Service: Orthopedics;  Laterality: Right;   HPI:  81 yo female admitted after speech difficulties and facial droop at home found to have scattered small R MCA infarcts. pt with  recent stay at [facility] after fall at home with r humerus fx s/p ORIF 12/3. PMHx for anxiety, HTN, Afib, OA, DDD    Assessment / Plan / Recommendation Clinical Impression  Mild left-sided weakness results in small amounts of left anterior loss and buccal pocketing with solid consistencies. Pt was able to self-monitor and regulate this with Min cues from SLP. Recommend regular diet and thin liquids with brief SLP f/u for tolerance and utilization of compensatory strategies.    Aspiration Risk  Mild    Diet Recommendation Regular;Thin liquid   Liquid Administration via: Cup;Straw Medication Administration: Whole meds with liquid Supervision: Patient able to self feed;Intermittent supervision to cue for compensatory strategies Compensations: Slow rate;Small sips/bites;Check for pocketing;Check for anterior loss Postural Changes and/or Swallow Maneuvers: Seated upright 90 degrees    Other  Recommendations Oral Care Recommendations: Oral care BID   Follow Up Recommendations   (tba)    Frequency and Duration min 2x/week  1 week   Pertinent Vitals/Pain n/a    SLP Swallow Goals     Swallow Study Prior Functional Status  Type of Home: House Available Help at Discharge: Family;Available PRN/intermittently    General Date of Onset: 06/18/14 HPI: 81 yo female admitted after speech difficulties and facial droop at home found to have scattered small R MCA infarcts. pt with recent stay at [facility] after fall at home with r humerus fx s/p ORIF 12/3. PMHx for anxiety, HTN, Afib, OA, DDD  Type of Study: Bedside swallow evaluation Previous Swallow Assessment: none in  chart Diet Prior to this Study: NPO Temperature Spikes Noted: No Respiratory Status: Room air History of Recent Intubation: No Behavior/Cognition: Alert;Cooperative;Pleasant mood Oral Cavity - Dentition: Dentures, top;Dentures, bottom Self-Feeding Abilities: Able to feed self Patient Positioning: Upright in  chair Baseline Vocal Quality: Clear Volitional Cough: Strong Volitional Swallow: Able to elicit    Oral/Motor/Sensory Function Overall Oral Motor/Sensory Function: Impaired Labial ROM: Reduced left Labial Symmetry: Abnormal symmetry left Labial Sensation: Reduced Lingual ROM: Within Functional Limits Lingual Symmetry: Within Functional Limits Lingual Strength: Within Functional Limits Facial ROM: Reduced left Facial Symmetry: Left droop Facial Strength: Reduced Velum: Within Functional Limits Mandible: Within Functional Limits   Ice Chips Ice chips: Not tested   Thin Liquid Thin Liquid: Within functional limits Presentation: Cup;Self Fed;Straw    Nectar Thick Nectar Thick Liquid: Not tested   Honey Thick Honey Thick Liquid: Not tested   Puree Puree: Impaired Presentation: Self Fed;Spoon Oral Phase Impairments: Reduced labial seal Oral Phase Functional Implications: Left anterior spillage   Solid   GO    Solid: Impaired Presentation: Self Fed Oral Phase Impairments: Impaired anterior to posterior transit Oral Phase Functional Implications: Left lateral sulci pocketing      Germain Osgood, M.A. CCC-SLP [phone]  Germain Osgood 06/19/2014,12:14 PM

## 2014-06-19 NOTE — Progress Notes (Signed)
Pt stated she has claustrophobia and is anxious for MRI.  MD notified and orders obtained.

## 2014-06-19 NOTE — Evaluation (Signed)
Speech Language Pathology Evaluation Patient Details Name: Jacqueline [name] MRN: [id number] DOB: Jan 04, 1929 Today's Date: 06/19/2014 Time: 5732-2025 SLP Time Calculation (min) (ACUTE ONLY): 15 min  Problem List:  Patient Active Problem List   Diagnosis Date Noted  . Acute ischemic right MCA stroke 06/18/2014  . Facial droop   . Displaced fracture of shaft of left humerus 04/03/2014  . Right arm fracture 04/01/2014  . Displaced spiral fracture of shaft of humerus   . Peripheral arterial disease 10/03/2013  . Chronic diastolic CHF (congestive heart failure) 08/08/2013  . Chronic venous insufficiency 04/02/2013  . Hypokalemia 01/13/2013  . PAF (paroxysmal atrial fibrillation) 10/03/2012  . CAD (coronary artery disease) of artery bypass graft 04/05/2012  . Atrial fibrillation with RVR 03/13/2012  . Need for prophylactic vaccination and inoculation against influenza 02/14/2012  . ABDOMINAL WALL HERNIA 01/21/2010  . PARESTHESIA 05/04/2009  . SKIN LESION 01/28/2009  . Nocturia 01/28/2009  . DYSPNEA 01/01/2009  . CVA (cerebral infarction) 11/17/2008  . ALLERGY 10/27/2008  . PAROXYSMAL NOCTURNAL DYSPNEA 10/03/2008  . Osteoarthrosis, unspecified whether generalized or localized, involving lower leg 09/03/2008  . RESTRICTIVE LUNG DISEASE 05/26/2008  . Anemia 01/08/2008  . UNS ADVRS EFF OTH RX MEDICINAL&BIOLOGICAL SBSTNC 11/09/2007  . HEMATOCHEZIA 07/18/2007  . PARANOIA 06/01/2007  . ANXIETY 01/23/2007  . Essential hypertension 01/23/2007  . GI BLEEDING 01/23/2007  . Osteoarthritis 01/23/2007  . DEGENERATIVE DISC DISEASE, LUMBAR SPINE 01/23/2007  . History of peptic ulcer disease 01/23/2007  . Personal History of Other Diseases of Digestive Disease 01/23/2007   Past Medical History:  Past Medical History  Diagnosis Date  . Allergy, unspecified not elsewhere classified   . Other dyspnea and respiratory abnormality   . Other diseases of lung, not elsewhere classified   . Anemia,  unspecified   . Unspecified adverse effect of other drug, medicinal and biological substance(995.29)   . Blood in stool   . Hemorrhage of gastrointestinal tract, unspecified   . Personal history of peptic ulcer disease   . Anxiety state, unspecified   . Personal history of other diseases of digestive system   . Unspecified essential hypertension   . Complication of anesthesia     "hard to wake me up after colonoscopy" (03/13/12)  . H/O hiatal hernia   . GERD (gastroesophageal reflux disease)   . Unspecified paranoid state   . Atrial fibrillation with rapid ventricular response 03/13/12  . Osteoarthrosis, unspecified whether generalized or localized, lower leg   . Degeneration of lumbar or lumbosacral intervertebral disc   . Unspecified cerebral artery occlusion with cerebral infarction 10/2008    "mini stroke" denies residual (03/13/12)  . History of IBS    Past Surgical History:  Past Surgical History  Procedure Laterality Date  . Pilonidal cyst / sinus excision  1957  . Dilation and curettage of uterus  1950's?  . Shoulder arthroscopy  ~ 2000    right  . Orif humerus fracture Right 04/03/2014    Procedure: OPEN REDUCTION INTERNAL FIXATION (ORIF) RIGHT HUMERUS DISTAL FRACTURE;  Surgeon: Roseanne Kaufman, MD;  Location: [place];  Service: Orthopedics;  Laterality: Right;   HPI:  81 yo female admitted after speech difficulties and facial droop at home found to have scattered small R MCA infarcts. pt with recent stay at [facility] after fall at home with r humerus fx s/p ORIF 12/3. PMHx for anxiety, HTN, Afib, OA, DDD    Assessment / Plan / Recommendation Clinical Impression  Pt has mild impairments with working memory  and  storage of new information, with decreased emergent and anticipatory awareness. Recommend f/u SLP to maximize cognitive function and increase level of independence.    SLP Assessment  Patient needs continued Speech Lanaguage Pathology Services    Follow Up  Recommendations  Skilled Nursing facility    Frequency and Duration min 2x/week  1 week   Pertinent Vitals/Pain Pain Assessment: No/denies pain   SLP Goals  Patient/Family Stated Goal: none stated Potential to Achieve Goals (ACUTE ONLY): Good  SLP Evaluation Prior Functioning  Cognitive/Linguistic Baseline: Within functional limits Type of Home: House  Lives With: Alone Available Help at Discharge: Family;Available PRN/intermittently   Cognition  Overall Cognitive Status: Impaired/Different from baseline Arousal/Alertness: Awake/alert Orientation Level: Oriented X4 Attention: Sustained Sustained Attention: Appears intact Memory: Impaired Memory Impairment: Storage deficit;Other (comment) (working memory) Awareness: Impaired Awareness Impairment: Emergent impairment;Anticipatory impairment Problem Solving: Impaired Problem Solving Impairment: Functional basic Executive Function: Reasoning Reasoning: Impaired Reasoning Impairment: Verbal basic;Functional basic Safety/Judgment: Impaired    Comprehension  Auditory Comprehension Overall Auditory Comprehension: Appears within functional limits for tasks assessed (for basic tasks/information) Reading Comprehension Reading Status: Within funtional limits    Expression Expression Primary Mode of Expression: Verbal Verbal Expression Overall Verbal Expression: Appears within functional limits for tasks assessed Written Expression Dominant Hand: Right Written Expression: Not tested   Oral / Motor Oral Motor/Sensory Function Overall Oral Motor/Sensory Function: Impaired Labial ROM: Reduced left Labial Symmetry: Abnormal symmetry left Labial Sensation: Reduced Lingual ROM: Within Functional Limits Lingual Symmetry: Within Functional Limits Lingual Strength: Within Functional Limits Facial ROM: Reduced left Facial Symmetry: Left droop Facial Strength: Reduced Velum: Within Functional Limits Mandible: Within Functional  Limits Motor Speech Overall Motor Speech: Appears within functional limits for tasks assessed (son says she is "slower" but rate appears [facility])   GO      Germain Osgood, M.A. CCC-SLP [phone]  Germain Osgood 06/19/2014, 12:20 PM

## 2014-06-19 NOTE — Progress Notes (Signed)
[facility] PROGRESS NOTE  Jacqueline [name] XFG:[id number] DOB: 03-18-1929 DOA: 06/18/2014 PCP: [name], MD  Assessment/Plan:  - R MCA acute ischemic stroke - waxing and waning symptoms, patient not candidate for TPA due to anticoagulation with xarelto, minimal deficits, and time since onset. Stroke work up pathway MRI positive.  PT/OT, need SNF.  Advance diet as tolerated xarelto change to eliquis by neurology  HTN - Will hold clonadine =, continue dilt for A.Fib rate control, holding lasix for now  A.Fib - continue xarelto and dilt, resume metoprolol, Hr increasing.   Code Status: full code.  Family Communication: care discussed with patient Disposition Plan: home when stroke work up completed.    Consultants:  neurology  Procedures:  ECHO; pending  Doppler; 1 to 39 %  Antibiotics:  none  HPI/Subjective: Feeling better, presents with slurred speech.   Objective: Filed Vitals:   06/19/14 1235  BP: 141/60  Pulse:   Temp:   Resp:    No intake or output data in the 24 hours ending 06/19/14 1323 Filed Weights   06/19/14 0900  Weight: 65.772 kg (145 lb)    Exam:   General:  Alert in no distress.   Cardiovascular: S 1, S 2 RRR  Respiratory: CTA  Abdomen: BS present, soft, nt  Musculoskeletal: no edema.   Neuro; alert , LE motor strength 5/5/   Data Reviewed: Basic Metabolic Panel:  Recent Labs Lab 06/18/14 1755 06/18/14 1808  NA 138 138  K 3.8 3.8  CL 103 102  CO2 30  --   GLUCOSE 100* 101*  BUN 18 19  CREATININE 0.90 0.90  CALCIUM 9.6  --    Liver Function Tests:  Recent Labs Lab 06/18/14 1755  AST 20  ALT 11  ALKPHOS 125*  BILITOT 0.4  PROT 6.8  ALBUMIN 3.8   No results for input(s): LIPASE, AMYLASE in the last 168 hours. No results for input(s): AMMONIA in the last 168 hours. CBC:  Recent Labs Lab 06/18/14 1755 06/18/14 1808  WBC 6.6  --   NEUTROABS 4.2  --   HGB 9.9* 11.2*  HCT 32.4* 33.0*  MCV 80.0  --    PLT 242  --    Cardiac Enzymes: No results for input(s): CKTOTAL, CKMB, CKMBINDEX, TROPONINI in the last 168 hours. BNP (last 3 results) No results for input(s): BNP in the last 8760 hours.  ProBNP (last 3 results) No results for input(s): PROBNP in the last 8760 hours.  CBG:  Recent Labs Lab 06/18/14 1817  GLUCAP 92    No results found for this or any previous visit (from the past 240 hour(s)).   Studies: Ct Head Wo Contrast  06/18/2014   CLINICAL DATA:  Code stroke, altered speech, confusion, expressive aphasia, history essential hypertension, stroke, atrial fibrillation  EXAM: CT HEAD WITHOUT CONTRAST  TECHNIQUE: Contiguous axial images were obtained from the base of the skull through the vertex without intravenous contrast.  COMPARISON:  03/31/2014  FINDINGS: Generalized atrophy.  Normal ventricular morphology.  No midline shift or mass effect.  Mild small vessel chronic ischemic changes of deep cerebral white matter.  Old LEFT cerebellar infarcts.  No intracranial hemorrhage, mass lesion, or evidence acute infarction.  Several tiny RIGHT hemisphere calcifications noted, unchanged.  No extra-axial fluid collections.  Bones demineralized.  No significant bone or sinus abnormalities.  Atherosclerotic calcifications of the carotid siphons.  IMPRESSION: Atrophy with small vessel chronic ischemic changes of deep cerebral white matter.  Old LEFT cerebral infarcts.  No acute intracranial abnormalities.  Findings called to Dr. Kathrynn Humble on 06/18/2014 at 1837 hours.   Electronically Signed   By: Lavonia Dana M.D.   On: 06/18/2014 18:40   Mr Brain Wo Contrast  06/19/2014   CLINICAL DATA:  Image evaluation for slurred speech and left facial droop. Evaluate for stroke.  EXAM: MRI HEAD WITHOUT CONTRAST  MRA HEAD WITHOUT CONTRAST  TECHNIQUE: Multiplanar, multiecho pulse sequences of the brain and surrounding structures were obtained without intravenous contrast. Angiographic images of the head were  obtained using MRA technique without contrast.  COMPARISON:  Prior CT from earlier the same day.  FINDINGS: MRI HEAD FINDINGS  Diffuse prominence of the CSF containing spaces is compatible with advanced generalized cerebral atrophy. Patchy and confluent T2/FLAIR hyperintensity within the periventricular and deep white matter most consistent with chronic small vessel ischemic disease. Small vessel changes present within the pons as well. Remote bilateral cerebellar infarcts noted.  There are scattered foci of restricted diffusion involving the cortical gray matter of the posterior right frontal lobe, compatible with acute right MCA territory infarct. Single small infarct present within the right parietal lobe is well. No associated hemorrhage or significant mass effect. Gray-white matter differentiation otherwise maintained. No other infarct.  No mass lesion or midline shift. No extra-axial fluid collection. Ventricular prominence related global parenchymal volume loss present without hydrocephalus.  Craniocervical junction within normal limits. Pituitary gland normal.  No acute abnormality seen about the orbits.  Paranasal sinuses are clear.  No mastoid effusion.  Bone marrow signal intensity normal. Scattered mild degenerative changes noted within the visualized upper cervical spine. Scalp soft tissues within normal limits.  MRA HEAD FINDINGS  ANTERIOR CIRCULATION:  Study degraded by motion artifact.  Visualized portions of the distal cervical segments of the internal carotid arteries are widely patent with antegrade flow. Petrous segments widely patent. Multi focal atherosclerotic irregularity present within the cavernous carotid arteries bilaterally, slightly worse on the right. There is likely associated least mild stenoses, although evaluation of this region somewhat limited due to motion artifact. Supraclinoid segments widely patent.  A1 segments patent bilaterally. The right A1 segment is hypoplastic. Multi  focal wall atheromatous irregularity present within the anterior cerebral and middle cerebral arteries bilaterally without proximal branch occlusion or hemodynamically significant stenosis. Distal branch atherosclerotic irregularity present within the MCA branches bilaterally.  POSTERIOR CIRCULATION:  Both vertebral arteries are patent to the vertebrobasilar junction. The right vertebral artery is slightly dominant. Posterior inferior cerebral arteries are patent proximally. Basilar artery widely patent. Superior cerebral arteries patent. Posterior cerebral arteries well opacified without occlusion or significant stenosis. Delete that  No aneurysm or vascular malformation.  IMPRESSION: MRI HEAD IMPRESSION:  1. Few small scattered cortical ischemic infarcts involving the right MCA distribution as above. No associated hemorrhage or significant mass effect. 2. Remote bilateral cerebellar infarcts. 3. Advanced cerebral atrophy with chronic microvascular ischemic disease.  MRA HEAD IMPRESSION:  1. No proximal branch occlusion or hemodynamically significant stenosis identified within the intracranial circulation. 2. Multi focal atheromatous irregularity involving the MCAs and ACAs as well as the cavernous carotid arteries bilaterally without significant stenosis or occlusion.   Electronically Signed   By: Jeannine Boga M.D.   On: 06/19/2014 01:18   Mr Jodene Nam Head/brain Wo Cm  06/19/2014   CLINICAL DATA:  Image evaluation for slurred speech and left facial droop. Evaluate for stroke.  EXAM: MRI HEAD WITHOUT CONTRAST  MRA HEAD WITHOUT CONTRAST  TECHNIQUE: Multiplanar, multiecho pulse sequences of the brain  and surrounding structures were obtained without intravenous contrast. Angiographic images of the head were obtained using MRA technique without contrast.  COMPARISON:  Prior CT from earlier the same day.  FINDINGS: MRI HEAD FINDINGS  Diffuse prominence of the CSF containing spaces is compatible with advanced  generalized cerebral atrophy. Patchy and confluent T2/FLAIR hyperintensity within the periventricular and deep white matter most consistent with chronic small vessel ischemic disease. Small vessel changes present within the pons as well. Remote bilateral cerebellar infarcts noted.  There are scattered foci of restricted diffusion involving the cortical gray matter of the posterior right frontal lobe, compatible with acute right MCA territory infarct. Single small infarct present within the right parietal lobe is well. No associated hemorrhage or significant mass effect. Gray-white matter differentiation otherwise maintained. No other infarct.  No mass lesion or midline shift. No extra-axial fluid collection. Ventricular prominence related global parenchymal volume loss present without hydrocephalus.  Craniocervical junction within normal limits. Pituitary gland normal.  No acute abnormality seen about the orbits.  Paranasal sinuses are clear.  No mastoid effusion.  Bone marrow signal intensity normal. Scattered mild degenerative changes noted within the visualized upper cervical spine. Scalp soft tissues within normal limits.  MRA HEAD FINDINGS  ANTERIOR CIRCULATION:  Study degraded by motion artifact.  Visualized portions of the distal cervical segments of the internal carotid arteries are widely patent with antegrade flow. Petrous segments widely patent. Multi focal atherosclerotic irregularity present within the cavernous carotid arteries bilaterally, slightly worse on the right. There is likely associated least mild stenoses, although evaluation of this region somewhat limited due to motion artifact. Supraclinoid segments widely patent.  A1 segments patent bilaterally. The right A1 segment is hypoplastic. Multi focal wall atheromatous irregularity present within the anterior cerebral and middle cerebral arteries bilaterally without proximal branch occlusion or hemodynamically significant stenosis. Distal branch  atherosclerotic irregularity present within the MCA branches bilaterally.  POSTERIOR CIRCULATION:  Both vertebral arteries are patent to the vertebrobasilar junction. The right vertebral artery is slightly dominant. Posterior inferior cerebral arteries are patent proximally. Basilar artery widely patent. Superior cerebral arteries patent. Posterior cerebral arteries well opacified without occlusion or significant stenosis. Delete that  No aneurysm or vascular malformation.  IMPRESSION: MRI HEAD IMPRESSION:  1. Few small scattered cortical ischemic infarcts involving the right MCA distribution as above. No associated hemorrhage or significant mass effect. 2. Remote bilateral cerebellar infarcts. 3. Advanced cerebral atrophy with chronic microvascular ischemic disease.  MRA HEAD IMPRESSION:  1. No proximal branch occlusion or hemodynamically significant stenosis identified within the intracranial circulation. 2. Multi focal atheromatous irregularity involving the MCAs and ACAs as well as the cavernous carotid arteries bilaterally without significant stenosis or occlusion.   Electronically Signed   By: Jeannine Boga M.D.   On: 06/19/2014 01:18    Scheduled Meds: . diltiazem  180 mg Oral BID  . pantoprazole  40 mg Oral Daily  . Rivaroxaban  15 mg Oral Daily   Continuous Infusions:   Principal Problem:   Acute ischemic right MCA stroke Active Problems:   Essential hypertension   Atrial fibrillation with RVR    Time spent: 35 minutes.     Niel Hummer A  [facility] Pager [phone]. If 7PM-7AM, please contact night-coverage at [email or web address], password [facility] 06/19/2014, 1:23 PM  LOS: 1 day

## 2014-06-19 NOTE — Evaluation (Signed)
Physical Therapy Evaluation Patient Details Name: Jacqueline [name] MRN: [id number] DOB: [birth year] Today's Date: 06/19/2014   History of Present Illness  81 yo female admitted after speech difficulties and facial droop at home found to have scattered small R MCA infarcts. pt with recent stay at [facility] after fall at [facility] with r humerus fx s/p ORIF 12/3. PMHx for anxiety, HTN, Afib, OA, DDD  Clinical Impression  Jacqueline [name] is pleasant but decreased problem solving and mobility from baseline per pt and son report. Pt lives alone and her only consistent assist was [facility] services. Pt will benefit from acute therapy to maximize mobility, function, activity tolerance, balance and gait to decrease burden of care and fall risk.     Follow Up Recommendations Supervision/Assistance - 24 hour;SNF    Equipment Recommendations  None recommended by PT    Recommendations for Other Services OT consult     Precautions / Restrictions Precautions Precautions: Fall Precaution Comments: pt states still not to have extensive weight bearing on RUE      Mobility  Bed Mobility Overal bed mobility: Needs Assistance Bed Mobility: Supine to Sit     Supine to sit: Min assist     General bed mobility comments: cues for sequence with increased time, momentum and min assist to elevate trunk from surface  Transfers Overall transfer level: Needs assistance   Transfers: Sit to/from Stand Sit to Stand: Min guard         General transfer comment: cues for hand placement, sequence and safety  Ambulation/Gait Ambulation/Gait assistance: Min guard Ambulation Distance (Feet): 90 Feet Assistive device: Rolling walker (2 wheeled) Gait Pattern/deviations: Shuffle;Trunk flexed;Drifts right/left   Gait velocity interpretation: Below normal speed for age/gender General Gait Details: pt with max cues to step into RW, steer RW in midline as she veers bilaterally throughout gait and running into objects x 4 despite  cues  Stairs            Wheelchair Mobility    Modified Rankin (Stroke Patients Only) Modified Rankin (Stroke Patients Only) Pre-Morbid Rankin Score: Moderate disability Modified Rankin: Moderately severe disability     Balance Overall balance assessment: Needs assistance   Sitting balance-Leahy Scale: Fair       Standing balance-Leahy Scale: Poor                               Pertinent Vitals/Pain Pain Assessment: No/denies pain    Home Living Family/patient expects to be discharged to:: Private residence Living Arrangements: Alone Available Help at Discharge: Family;Available PRN/intermittently Type of Home: House Home Access: Level entry     Home Layout: One level Home Equipment: Walker - 4 wheels;Bedside commode;Shower seat      Prior Function Level of Independence: Independent with assistive device(s)         Comments: Daughter drives to get her groceries, she heats up food does not prepare. Pt was receiving HHPT and HHOT, sponge bathes     Hand Dominance   Dominant Hand: Right    Extremity/Trunk Assessment   Upper Extremity Assessment: Generalized weakness           Lower Extremity Assessment: Generalized weakness (3/5 bil hip flexion, knee flexion and extension. pt reports no deficits in sensation)      Cervical / Trunk Assessment: Kyphotic  Communication   Communication: No difficulties (son reports speech is slightly slower than normal)  Cognition Arousal/Alertness: Awake/alert Behavior During Therapy: WFL for tasks  assessed/performed Overall Cognitive Status: Impaired/Different from baseline Area of Impairment: Safety/judgement         Safety/Judgement: Decreased awareness of safety          General Comments      Exercises        Assessment/Plan    PT Assessment Patient needs continued PT services  PT Diagnosis Difficulty walking;Altered mental status;Generalized weakness   PT Problem List  Decreased strength;Decreased activity tolerance;Decreased balance;Decreased mobility;Decreased knowledge of use of DME;Decreased safety awareness;Decreased skin integrity  PT Treatment Interventions Gait training;DME instruction;Functional mobility training;Therapeutic activities;Therapeutic exercise;Balance training;Patient/family education   PT Goals (Current goals can be found in the Care Plan section) Acute Rehab PT Goals Patient Stated Goal: return home PT Goal Formulation: With patient/family Time For Goal Achievement: 07/03/14 Potential to Achieve Goals: Good    Frequency Min 3X/week   Barriers to discharge Decreased caregiver support      Co-evaluation               End of Session   Activity Tolerance: Patient tolerated treatment well Patient left: in chair;with call bell/phone within [name];with chair alarm set;with family/visitor present Nurse Communication: Mobility status         Time: 1191-4782 PT Time Calculation (min) (ACUTE ONLY): 28 min   Charges:   PT Evaluation $Initial PT Evaluation Tier I: 1 Procedure PT Treatments $Therapeutic Activity: 8-22 mins   PT G CodesMelford [name] 06/19/2014, 10:41 AM Jacqueline [name], [place]

## 2014-06-20 ENCOUNTER — Telehealth: Payer: Self-pay | Admitting: Cardiology

## 2014-06-20 ENCOUNTER — Telehealth: Payer: Self-pay | Admitting: Internal Medicine

## 2014-06-20 LAB — GLUCOSE, CAPILLARY
Glucose-Capillary: 97 mg/dL (ref 70–99)
Glucose-Capillary: 107 mg/dL — ABNORMAL HIGH (ref 70–99)
Glucose-Capillary: 105 mg/dL — ABNORMAL HIGH (ref 70–99)
Glucose-Capillary: 107 mg/dL — ABNORMAL HIGH (ref 70–99)
Glucose-Capillary: 111 mg/dL — ABNORMAL HIGH (ref 70–99)
Glucose-Capillary: 111 mg/dL — ABNORMAL HIGH (ref 70–99)

## 2014-06-20 LAB — HEMOGLOBIN A1C
HEMOGLOBIN A1C: 5.6 % (ref 4.8–5.6)
Mean Plasma Glucose: 114 mg/dL

## 2014-06-20 MED ORDER — SIMETHICONE 80 MG PO CHEW
80.0000 mg | CHEWABLE_TABLET | Freq: Four times a day (QID) | ORAL | Status: DC | PRN
  Administered 2014-06-20: 80 mg via ORAL
  Filled 2014-06-20: qty 1

## 2014-06-20 MED ORDER — HYDROCODONE-ACETAMINOPHEN 7.5-325 MG PO TABS: 1.0000 | ORAL_TABLET | Freq: Four times a day (QID) | ORAL | Status: DC | PRN

## 2014-06-20 MED ORDER — ATORVASTATIN CALCIUM 20 MG PO TABS: 20.0000 mg | ORAL_TABLET | Freq: Every day | ORAL | Status: DC

## 2014-06-20 MED ORDER — ALPRAZOLAM 0.25 MG PO TABS: 0.1250 mg | ORAL_TABLET | Freq: Four times a day (QID) | ORAL | Status: DC

## 2014-06-20 MED ORDER — SIMETHICONE 80 MG PO CHEW: 80.0000 mg | CHEWABLE_TABLET | Freq: Four times a day (QID) | ORAL | Status: DC | PRN

## 2014-06-20 MED ORDER — APIXABAN 5 MG PO TABS: 5.0000 mg | ORAL_TABLET | Freq: Two times a day (BID) | ORAL | Status: AC

## 2014-06-20 MED ORDER — ALPRAZOLAM 0.25 MG PO TABS
0.1250 mg | ORAL_TABLET | Freq: Four times a day (QID) | ORAL | Status: DC
  Administered 2014-06-20 – 2014-06-21 (×5): 0.125 mg via ORAL
  Filled 2014-06-20 (×5): qty 1

## 2014-06-20 NOTE — Progress Notes (Signed)
[facility] PROGRESS NOTE  Lillybeth Tal JIR:[id number] DOB: 1928/11/01 DOA: 06/18/2014 PCP: [name], MD  Assessment/Plan:  - R MCA acute ischemic stroke - waxing and waning symptoms, patient not candidate for TPA due to anticoagulation with xarelto, minimal deficits, and time since onset. Stroke work up pathway MRI positive.  PT/OT, need SNF.  Advance diet as tolerated xarelto change to eliquis by neurology  HTN - Will hold clonadine =, continue dilt for A.Fib rate control, holding lasix for now  A.Fib - continue xarelto and dilt, resume metoprolol, Hr increasing.  Anxiety; change xanax to every 6 Hours PRN. Abdominal pain; patient relate abdominal pain, thisnk is gas. Wants to try Gasx. KUB if no resolved.   Code Status: full code.  Family Communication: care discussed with patient Disposition Plan: SNF 2-20  Consultants:  neurology  Procedures:  ECHO; pending  Doppler; 1 to 39 %  Antibiotics:  none  HPI/Subjective: Feeling better, presents with slurred speech.  elates mild abdominal pain. Wants Gas x.  She takes xanax more frequent than 3 times a day.   Objective: Filed Vitals:   06/20/14 1630  BP: 117/45  Pulse: 68  Temp: 98.1 F (36.7 C)  Resp: 16   No intake or output data in the 24 hours ending 06/20/14 1926 Filed Weights   06/19/14 0900 06/19/14 1841  Weight: 65.772 kg (145 lb) 65.772 kg (145 lb)    Exam:   General:  Alert in no distress.   Cardiovascular: S 1, S 2 RRR  Respiratory: CTA  Abdomen: BS present, soft, nt  Musculoskeletal: no edema.   Neuro; alert , LE motor strength 5/5/   Data Reviewed: Basic Metabolic Panel:  Recent Labs Lab 06/18/14 1755 06/18/14 1808  NA 138 138  K 3.8 3.8  CL 103 102  CO2 30  --   GLUCOSE 100* 101*  BUN 18 19  CREATININE 0.90 0.90  CALCIUM 9.6  --    Liver Function Tests:  Recent Labs Lab 06/18/14 1755  AST 20  ALT 11  ALKPHOS 125*  BILITOT 0.4  PROT 6.8  ALBUMIN 3.8    No results for input(s): LIPASE, AMYLASE in the last 168 hours. No results for input(s): AMMONIA in the last 168 hours. CBC:  Recent Labs Lab 06/18/14 1755 06/18/14 1808  WBC 6.6  --   NEUTROABS 4.2  --   HGB 9.9* 11.2*  HCT 32.4* 33.0*  MCV 80.0  --   PLT 242  --    Cardiac Enzymes: No results for input(s): CKTOTAL, CKMB, CKMBINDEX, TROPONINI in the last 168 hours. BNP (last 3 results) No results for input(s): BNP in the last 8760 hours.  ProBNP (last 3 results) No results for input(s): PROBNP in the last 8760 hours.  CBG:  Recent Labs Lab 06/20/14 0110 06/20/14 0346 06/20/14 0803 06/20/14 1114 06/20/14 1738  GLUCAP 97 107* 105* 107* 111*    No results found for this or any previous visit (from the past 240 hour(s)).   Studies: Mr Herby Abraham Contrast  06/19/2014   CLINICAL DATA:  Image evaluation for slurred speech and left facial droop. Evaluate for stroke.  EXAM: MRI HEAD WITHOUT CONTRAST  MRA HEAD WITHOUT CONTRAST  TECHNIQUE: Multiplanar, multiecho pulse sequences of the brain and surrounding structures were obtained without intravenous contrast. Angiographic images of the head were obtained using MRA technique without contrast.  COMPARISON:  Prior CT from earlier the same day.  FINDINGS: MRI HEAD FINDINGS  Diffuse prominence of the CSF  containing spaces is compatible with advanced generalized cerebral atrophy. Patchy and confluent T2/FLAIR hyperintensity within the periventricular and deep white matter most consistent with chronic small vessel ischemic disease. Small vessel changes present within the pons as well. Remote bilateral cerebellar infarcts noted.  There are scattered foci of restricted diffusion involving the cortical gray matter of the posterior right frontal lobe, compatible with acute right MCA territory infarct. Single small infarct present within the right parietal lobe is well. No associated hemorrhage or significant mass effect. Gray-white matter  differentiation otherwise maintained. No other infarct.  No mass lesion or midline shift. No extra-axial fluid collection. Ventricular prominence related global parenchymal volume loss present without hydrocephalus.  Craniocervical junction within normal limits. Pituitary gland normal.  No acute abnormality seen about the orbits.  Paranasal sinuses are clear.  No mastoid effusion.  Bone marrow signal intensity normal. Scattered mild degenerative changes noted within the visualized upper cervical spine. Scalp soft tissues within normal limits.  MRA HEAD FINDINGS  ANTERIOR CIRCULATION:  Study degraded by motion artifact.  Visualized portions of the distal cervical segments of the internal carotid arteries are widely patent with antegrade flow. Petrous segments widely patent. Multi focal atherosclerotic irregularity present within the cavernous carotid arteries bilaterally, slightly worse on the right. There is likely associated least mild stenoses, although evaluation of this region somewhat limited due to motion artifact. Supraclinoid segments widely patent.  A1 segments patent bilaterally. The right A1 segment is hypoplastic. Multi focal wall atheromatous irregularity present within the anterior cerebral and middle cerebral arteries bilaterally without proximal branch occlusion or hemodynamically significant stenosis. Distal branch atherosclerotic irregularity present within the MCA branches bilaterally.  POSTERIOR CIRCULATION:  Both vertebral arteries are patent to the vertebrobasilar junction. The right vertebral artery is slightly dominant. Posterior inferior cerebral arteries are patent proximally. Basilar artery widely patent. Superior cerebral arteries patent. Posterior cerebral arteries well opacified without occlusion or significant stenosis. Delete that  No aneurysm or vascular malformation.  IMPRESSION: MRI HEAD IMPRESSION:  1. Few small scattered cortical ischemic infarcts involving the right MCA  distribution as above. No associated hemorrhage or significant mass effect. 2. Remote bilateral cerebellar infarcts. 3. Advanced cerebral atrophy with chronic microvascular ischemic disease.  MRA HEAD IMPRESSION:  1. No proximal branch occlusion or hemodynamically significant stenosis identified within the intracranial circulation. 2. Multi focal atheromatous irregularity involving the MCAs and ACAs as well as the cavernous carotid arteries bilaterally without significant stenosis or occlusion.   Electronically Signed   By: Jeannine Boga M.D.   On: 06/19/2014 01:18   Mr Jodene Nam Head/brain Wo Cm  06/19/2014   CLINICAL DATA:  Image evaluation for slurred speech and left facial droop. Evaluate for stroke.  EXAM: MRI HEAD WITHOUT CONTRAST  MRA HEAD WITHOUT CONTRAST  TECHNIQUE: Multiplanar, multiecho pulse sequences of the brain and surrounding structures were obtained without intravenous contrast. Angiographic images of the head were obtained using MRA technique without contrast.  COMPARISON:  Prior CT from earlier the same day.  FINDINGS: MRI HEAD FINDINGS  Diffuse prominence of the CSF containing spaces is compatible with advanced generalized cerebral atrophy. Patchy and confluent T2/FLAIR hyperintensity within the periventricular and deep white matter most consistent with chronic small vessel ischemic disease. Small vessel changes present within the pons as well. Remote bilateral cerebellar infarcts noted.  There are scattered foci of restricted diffusion involving the cortical gray matter of the posterior right frontal lobe, compatible with acute right MCA territory infarct. Single small infarct present within the right  parietal lobe is well. No associated hemorrhage or significant mass effect. Gray-white matter differentiation otherwise maintained. No other infarct.  No mass lesion or midline shift. No extra-axial fluid collection. Ventricular prominence related global parenchymal volume loss present without  hydrocephalus.  Craniocervical junction within normal limits. Pituitary gland normal.  No acute abnormality seen about the orbits.  Paranasal sinuses are clear.  No mastoid effusion.  Bone marrow signal intensity normal. Scattered mild degenerative changes noted within the visualized upper cervical spine. Scalp soft tissues within normal limits.  MRA HEAD FINDINGS  ANTERIOR CIRCULATION:  Study degraded by motion artifact.  Visualized portions of the distal cervical segments of the internal carotid arteries are widely patent with antegrade flow. Petrous segments widely patent. Multi focal atherosclerotic irregularity present within the cavernous carotid arteries bilaterally, slightly worse on the right. There is likely associated least mild stenoses, although evaluation of this region somewhat limited due to motion artifact. Supraclinoid segments widely patent.  A1 segments patent bilaterally. The right A1 segment is hypoplastic. Multi focal wall atheromatous irregularity present within the anterior cerebral and middle cerebral arteries bilaterally without proximal branch occlusion or hemodynamically significant stenosis. Distal branch atherosclerotic irregularity present within the MCA branches bilaterally.  POSTERIOR CIRCULATION:  Both vertebral arteries are patent to the vertebrobasilar junction. The right vertebral artery is slightly dominant. Posterior inferior cerebral arteries are patent proximally. Basilar artery widely patent. Superior cerebral arteries patent. Posterior cerebral arteries well opacified without occlusion or significant stenosis. Delete that  No aneurysm or vascular malformation.  IMPRESSION: MRI HEAD IMPRESSION:  1. Few small scattered cortical ischemic infarcts involving the right MCA distribution as above. No associated hemorrhage or significant mass effect. 2. Remote bilateral cerebellar infarcts. 3. Advanced cerebral atrophy with chronic microvascular ischemic disease.  MRA HEAD  IMPRESSION:  1. No proximal branch occlusion or hemodynamically significant stenosis identified within the intracranial circulation. 2. Multi focal atheromatous irregularity involving the MCAs and ACAs as well as the cavernous carotid arteries bilaterally without significant stenosis or occlusion.   Electronically Signed   By: Jeannine Boga M.D.   On: 06/19/2014 01:18    Scheduled Meds: . ALPRAZolam  0.125 mg Oral Q6H  . apixaban  5 mg Oral BID  . atorvastatin  20 mg Oral q1800  . diltiazem  180 mg Oral BID  . metoprolol succinate  50 mg Oral Daily  . pantoprazole  40 mg Oral Daily   Continuous Infusions:   Principal Problem:   Acute ischemic right MCA stroke Active Problems:   Essential hypertension   Atrial fibrillation with RVR   Cerebral infarction due to embolism of right middle cerebral artery   HLD (hyperlipidemia)    Time spent: 25 minutes.     Niel Hummer A  [facility] Pager [phone]. If 7PM-7AM, please contact night-coverage at [email or web address], password [facility] 06/20/2014, 7:26 PM  LOS: 2 days

## 2014-06-20 NOTE — Progress Notes (Signed)
UR complete.  Andriel Omalley RN, MSN 

## 2014-06-20 NOTE — Care Management Note (Signed)
    Page 1 of 1   06/20/2014     11:21:44 AM CARE MANAGEMENT NOTE 06/20/2014  Patient:  Jacqueline [name], Jacqueline [name]   Account Number:  [id number]  Date Initiated:  06/20/2014  Documentation initiated by:  Lorne Skeens  Subjective/Objective Assessment:   Patient was admitted for CVA.  Lives at home alone.     Action/Plan:   Will follow for discharge needs pending PT/OT evals and physician orders.   Anticipated DC Date:  06/21/2014   Anticipated DC Plan:  SKILLED NURSING FACILITY  In-house referral  Clinical Social Worker         Choice offered to / List presented to:             Status of service:  Completed, signed off Medicare Important Message given?  YES (If response is "NO", the following Medicare IM given date fields will be blank) Date Medicare IM given:  06/20/2014 Medicare IM given by:  Lorne Skeens Date Additional Medicare IM given:   Additional Medicare IM given by:    Discharge Disposition:    Per UR Regulation:  Reviewed for med. necessity/level of care/duration of stay  If discussed at [place] of Stay Meetings, dates discussed:    Comments:  06/20/14 [place], MSN, CM- Medicare IM letter provided.

## 2014-06-20 NOTE — Progress Notes (Signed)
Speech Language Pathology Treatment: Dysphagia;Cognitive-Linquistic  Patient Details Name: Jacqueline [name] MRN: [id number] DOB: Jan 08, 1929 Today's Date: 06/20/2014 Time: 3536-1443 SLP Time Calculation (min) (ACUTE ONLY): 17 min  Assessment / Plan / Recommendation Clinical Impression  Pt seen for dysphagia and cognitive treatment. Pt verbose with noted difficulty expressing thoughts clearly and fluidly although able to relay most of thought effectively. SLP educated pt on strategies to facilitate word finding and organization in conversational speech (synonyms, descriptions etc). Began discussion and education for memory strategies when MD arrived and session cut short. Prior, pt consumed straw sip thin water without evidence of oropharyngeal difficulty. Continue ST cognitive-communicative and dysphagia treatment.   HPI HPI: 81 yo female admitted after speech difficulties and facial droop at home found to have scattered small R MCA infarcts. pt with recent stay at [facility] after fall at home with r humerus fx s/p ORIF 12/3. PMHx for anxiety, HTN, Afib, OA, DDD    Pertinent Vitals Pain Assessment: No/denies pain  SLP Plan  Continue with current plan of care    Recommendations Diet recommendations: Regular;Thin liquid Liquids provided via: Cup;Straw Medication Administration: Whole meds with liquid Supervision: Patient able to self feed;Intermittent supervision to cue for compensatory strategies Compensations: Slow rate;Small sips/bites;Check for pocketing;Check for anterior loss Postural Changes and/or Swallow Maneuvers: Seated upright 90 degrees              Oral Care Recommendations: Oral care BID Follow up Recommendations: Skilled Nursing facility Plan: Continue with current plan of care    GO     Houston Siren 06/20/2014, 2:01 PM  Orbie Pyo Colvin Caroli.Ed [employer] [phone]

## 2014-06-20 NOTE — Telephone Encounter (Signed)
It would appear she is in the hospital right now. Can we schedule her a follow up of that stay if appropriate if she wants to come here. She was asked to schedule a visit in September and did not do that.

## 2014-06-20 NOTE — Clinical Social Work Placement (Addendum)
Clinical Social Work Department CLINICAL SOCIAL WORK PLACEMENT NOTE 06/20/2014  Patient:  Jacqueline [name], Jacqueline [name]  Account Number:  [id number] Admit date:  06/18/2014  Clinical Social Worker:  Glendon Axe, CLINICAL SOCIAL WORKER  Date/time:  06/19/2014 11:26 AM  Clinical Social Work is seeking post-discharge placement for this patient at the following level of care:   [place]   (*CSW will update this form in Epic as items are completed)   06/19/2014  Patient/family provided with [place] Department of Clinical Social Work's list of facilities offering this level of care within the geographic area requested by the patient (or if unable, by the patient's family).  06/19/2014  Patient/family informed of their freedom to choose among providers that offer the needed level of care, that participate in Medicare, Medicaid or managed care program needed by the patient, have an available bed and are willing to accept the patient.  06/19/2014  Patient/family informed of [facility]' ownership interest in [facility], as well as of the fact that they are under no obligation to receive care at this facility.  PASARR submitted to EDS on  PASARR number received on   FL2 transmitted to all facilities in geographic area requested by pt/family on  06/19/2014 FL2 transmitted to all facilities within larger geographic area on   Patient informed that his/her managed care company has contracts with or will negotiate with  certain facilities, including the following:   YES     Patient/family informed of bed offers received:  06/20/2014 Patient chooses bed at [facility]  Physician recommends and patient chooses bed at    Patient to be transferred to [place]   on  06/21/2014 Patient to be transferred to facility by [facility] Patient and family notified of transfer on 06/21/2014 Name of family member notified: Pt's son,  [name]  The following physician request were entered in Epic:   Additional Comments:   Glendon Axe, MSW, [place] [phone] 06/20/2014 11:27 AM

## 2014-06-20 NOTE — Telephone Encounter (Signed)
Spoke with [name], pt's daughter.   She states pt currently admitted to hospital. Pt's daughter states Dr Aundra Dubin has not seen patient during this hospitalization. Pt's admitting doctor is recommending pt change from Xarelto to Eliquis. Pt's daughter states pt uncomfortable with this and wants Dr Claris Gladden input.  Pt's daughter states neither she or pt have discussed with pt's current admitting doctor the reason for his recommendation to change medication.   I advised/encouraged  pt's daughter and patient to discuss with pt's doctor /nurse at hospital  reason for medication change.  If pt would like would like a cardiology consult pt could request a cardiology consult through her current admitting doctor.   Pt's daughter said she understood and would discuss with pt.

## 2014-06-20 NOTE — Clinical Social Work Note (Signed)
Clinical Education officer, museum received phone call from [facility] extending bed offer. Facility has confirmed Medicare days and reported patient can return once medically stable for discharge. Pt's family and MD notified.   FL2 on chart for MD signature.   CSW will continue to follow pt and pt's family for continued support and to facilitate pt's discharge needs once medically stable.   Glendon Axe, MSW, LCSWA [phone] 06/20/2014 2:48 PM

## 2014-06-20 NOTE — Clinical Social Work Psychosocial (Signed)
Clinical Social Work Department BRIEF PSYCHOSOCIAL ASSESSMENT 06/20/2014  Patient:  Jacqueline [name], Jacqueline [name]     Account Number:  [id number]     Admit date:  06/18/2014  Clinical Social Worker:  Glendon Axe, CLINICAL SOCIAL WORKER  Date/Time:  06/19/2014 10:43 AM  Referred by:  Physician  Date Referred:  06/19/2014 Referred for  SNF Placement   Other Referral:   Interview type:  Other - See comment Other interview type:   CSW met with patient and pt's dtr, Jacqueline [name]  present at bedside. CSW also spoke with pt's son, Jacqueline [name] via telephone.    PSYCHOSOCIAL DATA Living Status:  ALONE Admitted from facility:   Level of care:   Primary support name:  Jacqueline [name] and Jacqueline [name] Primary support relationship to patient:  CHILD, ADULT Degree of support available:   Strong    CURRENT CONCERNS Current Concerns  Post-Acute Placement   Other Concerns:    SOCIAL WORK ASSESSMENT / PLAN Clinical Social Worker met with patient at bedside in reference to post-acute placement for SNF. CSW introduced CSW role and SNF process. Pt reported she has been at [facility] in the past and wishes to return if needing SNF placement again. Pt asked several questions in regards to ALF vs SNF. CSW explained difference between SNF's and ALF's. Pt reported she has been discussing ALF's with her children and really considering long-term placement. Pt stated she is would like to have CSW to discuss SNF options and ALF information with her children and does not feel comfortable making the discussion alone. CSW agreed. CSW later met with pt's dtr, Jacqueline [name] and discussed SNF placement and also spoke of ALF information. Pt's dtr reported pt has been receiving [place] services since discharged from [facility] and stated pt has been mentioning ALF's to her son, Jacqueline [name]. Pt's dtr feels pt would benefit from SNF placement and family will review ALF list together with pt. CSW also contacted pt's son, Jacqueline [name] and discussed  SNF placement with him as well. CSW provided both SNF/ALF list to pt's family.   Assessment/plan status:  Psychosocial Support/Ongoing Assessment of Needs Other assessment/ plan:   Information/referral to community resources:   SNF/ALF information provided and reviewed.    PATIENT'S/FAMILY'S RESPONSE TO PLAN OF CARE: Pt lying in bed, alert and slightly confused during assessment. Patient reported she would like to return to [facility] if needing SNF placement and would like CSW to further discuss options and ALF information with her children. Pt's dtr reported she knew pt had a stroke once speaking with her via telephone and when she arrived to pt's home she asked pt to smile however pt had facial droop. Pt's dtr reported she contacted pt's PCP and then rushed pt to [facility]. Pt's dtr reported she does not feel pt is comfortable alone at home anymore and pt confirmed. Pt and pt's family argeeable to SNF placement. Pt and pt's family pleasant and appreciated social work intervention.     Glendon Axe, MSW, LCSWA [phone] 06/20/2014 11:25 AM

## 2014-06-20 NOTE — Telephone Encounter (Signed)
New Message     Patient has had a mini stroke and is at [facility] and the daughter needs to talk to Dr. About the medications that they are changing her to.  Please give a call back to clarify.

## 2014-06-20 NOTE — Telephone Encounter (Signed)
Patient no showed for [facility] FU.  Please advise.

## 2014-06-20 NOTE — Telephone Encounter (Signed)
Do you want this patient to reschedule?

## 2014-06-21 DIAGNOSIS — I639 Cerebral infarction, unspecified: Secondary | ICD-10-CM | POA: Diagnosis not present

## 2014-06-21 DIAGNOSIS — M6281 Muscle weakness (generalized): Secondary | ICD-10-CM | POA: Diagnosis not present

## 2014-06-21 DIAGNOSIS — R278 Other lack of coordination: Secondary | ICD-10-CM | POA: Diagnosis not present

## 2014-06-21 DIAGNOSIS — I69392 Facial weakness following cerebral infarction: Secondary | ICD-10-CM | POA: Diagnosis not present

## 2014-06-21 DIAGNOSIS — R4701 Aphasia: Secondary | ICD-10-CM | POA: Diagnosis not present

## 2014-06-21 DIAGNOSIS — E785 Hyperlipidemia, unspecified: Secondary | ICD-10-CM | POA: Diagnosis not present

## 2014-06-21 DIAGNOSIS — I509 Heart failure, unspecified: Secondary | ICD-10-CM | POA: Diagnosis not present

## 2014-06-21 DIAGNOSIS — I10 Essential (primary) hypertension: Secondary | ICD-10-CM | POA: Diagnosis not present

## 2014-06-21 DIAGNOSIS — I110 Hypertensive heart disease with heart failure: Secondary | ICD-10-CM | POA: Diagnosis not present

## 2014-06-21 DIAGNOSIS — R41841 Cognitive communication deficit: Secondary | ICD-10-CM | POA: Diagnosis not present

## 2014-06-21 DIAGNOSIS — R2681 Unsteadiness on feet: Secondary | ICD-10-CM | POA: Diagnosis not present

## 2014-06-21 DIAGNOSIS — I69328 Other speech and language deficits following cerebral infarction: Secondary | ICD-10-CM | POA: Diagnosis not present

## 2014-06-21 DIAGNOSIS — I63511 Cerebral infarction due to unspecified occlusion or stenosis of right middle cerebral artery: Secondary | ICD-10-CM | POA: Diagnosis not present

## 2014-06-21 DIAGNOSIS — I4891 Unspecified atrial fibrillation: Secondary | ICD-10-CM | POA: Diagnosis not present

## 2014-06-21 DIAGNOSIS — I2510 Atherosclerotic heart disease of native coronary artery without angina pectoris: Secondary | ICD-10-CM | POA: Diagnosis not present

## 2014-06-21 DIAGNOSIS — R1312 Dysphagia, oropharyngeal phase: Secondary | ICD-10-CM | POA: Diagnosis not present

## 2014-06-21 DIAGNOSIS — I5032 Chronic diastolic (congestive) heart failure: Secondary | ICD-10-CM | POA: Diagnosis not present

## 2014-06-21 DIAGNOSIS — F411 Generalized anxiety disorder: Secondary | ICD-10-CM | POA: Diagnosis not present

## 2014-06-21 DIAGNOSIS — Z9181 History of falling: Secondary | ICD-10-CM | POA: Diagnosis not present

## 2014-06-21 DIAGNOSIS — G510 Bell's palsy: Secondary | ICD-10-CM | POA: Diagnosis not present

## 2014-06-21 DIAGNOSIS — I63411 Cerebral infarction due to embolism of right middle cerebral artery: Secondary | ICD-10-CM | POA: Diagnosis not present

## 2014-06-21 LAB — GLUCOSE, CAPILLARY
Glucose-Capillary: 106 mg/dL — ABNORMAL HIGH (ref 70–99)
Glucose-Capillary: 119 mg/dL — ABNORMAL HIGH (ref 70–99)

## 2014-06-21 NOTE — Progress Notes (Signed)
RN called report to facility. Patient discharged to [facility] facility. Patient alert and oriented, voiding adequate amount of urine, mae's well with minimal assist with walker. Patient stated understanding of instructions given at discharged. Tomma Rakers RN

## 2014-06-21 NOTE — Discharge Summary (Signed)
Physician Discharge Summary  Jacqueline [name] EKC:[id number] DOB: [birth year] DOA: 06/18/2014  PCP: Jacqueline Hare, MD  Admit date: 06/18/2014 Discharge date: 06/21/2014  Time spent: 35 minutes  Recommendations for Outpatient Follow-up:  1. Further adjustment of BP medications.  2.   Discharge Diagnoses:  Principal Problem:   Acute ischemic right MCA stroke Active Problems:   Essential hypertension   Atrial fibrillation with RVR   Cerebral infarction due to embolism of right middle cerebral artery   HLD (hyperlipidemia)   Discharge Condition: stable  Diet recommendation: heart healthy  Filed Weights   06/19/14 0900 06/19/14 1841  Weight: 65.772 kg (145 lb) 65.772 kg (145 lb)    History of present illness:  Jacqueline [name] is a 81 y.o. female who presents to the ED after acute onset of slurred speech and L facial droop. She has a history of cerebellar stroke a couple of years back, PAF, is on chronic Xarelto for this. Symptoms onset at 2:30 pm today, have been waxing and waning since onset, were initially resolved on presentation to ED but now the facial droop symptoms have returned.  Hospital Course:  R MCA acute ischemic stroke -patient not candidate for TPA due to anticoagulation with xarelto, minimal deficits, and time since onset. Stroke work up pathway MRI positive.  PT/OT, need SNF.  Advance diet as tolerated xarelto change to eliquis by neurology due to renal function. And will allow patient to be on therapeutic dose to prevent stroke.  HbA 1c at 5.6. LD 93  HTN - Will hold clonadine =, continue dilt for A.Fib rate control, holding lasix for now  A.Fib - continue xarelto and dilt, resume metoprolol, Hr increasing.  Anxiety; change xanax to every 6 Hours PRN. Abdominal pain; resolved with Gasx.   Procedures:  ECHO; - The right ventricular systolic pressure was increased consistent with moderate pulmonary hypertension.  Doppler; 1 to 39  %  Consultations:  neurology  Discharge Exam: Filed Vitals:   06/21/14 0931  BP: 134/67  Pulse: 106  Temp: 97.7 F (36.5 C)  Resp: 16    General: Alert in no distress.  Cardiovascular: S 1, S 2 RRR Respiratory: CTA  Discharge Instructions   Discharge Instructions    Ambulatory referral to Neurology    Complete by:  As directed   Pt will follow up with Dr. Erlinda [name] at [facility] in about 2 months. Thanks.     Diet - low sodium heart healthy    Complete by:  As directed      Increase activity slowly    Complete by:  As directed           Current Discharge Medication List    START taking these medications   Details  apixaban (ELIQUIS) 5 MG TABS tablet Take 1 tablet (5 mg total) by mouth 2 (two) times daily. Qty: 60 tablet, Refills: 0    atorvastatin (LIPITOR) 20 MG tablet Take 1 tablet (20 mg total) by mouth daily at 6 PM. Qty: 30 tablet, Refills: 0    simethicone (MYLICON) 80 MG chewable tablet Chew 1 tablet (80 mg total) by mouth 4 (four) times daily as needed for flatulence. Qty: 30 tablet, Refills: 0      CONTINUE these medications which have CHANGED   Details  ALPRAZolam (XANAX) 0.25 MG tablet Take 0.5 tablets (0.125 mg total) by mouth every 6 (six) hours. Qty: 30 tablet, Refills: 0    HYDROcodone-acetaminophen (NORCO) 7.5-325 MG per tablet Take 1 tablet by mouth every 6 (six)  hours as needed for moderate pain. Qty: 30 tablet, Refills: 0      CONTINUE these medications which have NOT CHANGED   Details  cloNIDine (CATAPRES) 0.1 MG tablet Take 1 tablet (0.1 mg total) by mouth every 2 (two) hours as needed (take as needed for a  Blood pressure that is greater than 160 on top, or bottom number greater than 100.). Qty: 30 tablet, Refills: 3    furosemide (LASIX) 40 MG tablet Take 1 tablet (40 mg total) by mouth daily. Qty: 30 tablet, Refills: 0    Cholecalciferol (VITAMIN D3) 5000 UNITS CAPS Take 5,000 Units by mouth daily.    DILTIAZEM HCL ER PO Take 180 mg by  mouth 2 (two) times daily. Before meals    metoprolol succinate (TOPROL-XL) 50 MG 24 hr tablet TAKE 1 TABLET (50 MG TOTAL) BY MOUTH 2 (TWO) TIMES DAILY. TAKE WITH OR IMMEDIATELY FOLLOWING A MEAL. Qty: 60 tablet, Refills: 0    Multiple Vitamins-Minerals (CENTRUM SILVER PO) Take 1 tablet by mouth daily.    pantoprazole (PROTONIX) 40 MG tablet TAKE 1 TABLET EVERY MORNING Qty: 30 tablet, Refills: 0    potassium chloride SA (K-DUR,KLOR-CON) 20 MEQ tablet TAKE 1 TABLET EVERY DAY Qty: 90 tablet, Refills: 3      STOP taking these medications     XARELTO 15 MG TABS tablet        Allergies  Allergen Reactions  . Metoclopramide Hcl Other (See Comments)     Tremors  . Penicillins Itching  . Aspirin Other (See Comments)    "gave me stomach aches; made me bleed" (03/13/12)  . Zetia [Ezetimibe] Other (See Comments)    Stomach and back pain   Follow-up Information    Follow up with Xu,Jindong, MD. Schedule an appointment as soon as possible for a visit in 2 months.   Specialty:  Neurology   Why:  stroke clinic   Contact information:   [address] [place] [address] [phone]       Follow up with Jacqueline Hare, MD In 1 week.   Specialty:  Internal Medicine       The results of significant diagnostics from this hospitalization (including imaging, microbiology, ancillary and laboratory) are listed below for reference.    Significant Diagnostic Studies: Ct Head Wo Contrast  06/18/2014   CLINICAL DATA:  Code stroke, altered speech, confusion, expressive aphasia, history essential hypertension, stroke, atrial fibrillation  EXAM: CT HEAD WITHOUT CONTRAST  TECHNIQUE: Contiguous axial images were obtained from the base of the skull through the vertex without intravenous contrast.  COMPARISON:  03/31/2014  FINDINGS: Generalized atrophy.  Normal ventricular morphology.  No midline shift or mass effect.  Mild small vessel chronic ischemic changes of deep cerebral white  matter.  Old LEFT cerebellar infarcts.  No intracranial hemorrhage, mass lesion, or evidence acute infarction.  Several tiny RIGHT hemisphere calcifications noted, unchanged.  No extra-axial fluid collections.  Bones demineralized.  No significant bone or sinus abnormalities.  Atherosclerotic calcifications of the carotid siphons.  IMPRESSION: Atrophy with small vessel chronic ischemic changes of deep cerebral white matter.  Old LEFT cerebral infarcts.  No acute intracranial abnormalities.  Findings called to Dr. Kathrynn Humble on 06/18/2014 at 1837 hours.   Electronically Signed   By: Lavonia Dana M.D.   On: 06/18/2014 18:40   Mr Brain Wo Contrast  06/19/2014   CLINICAL DATA:  Image evaluation for slurred speech and left facial droop. Evaluate for stroke.  EXAM: MRI HEAD  WITHOUT CONTRAST  MRA HEAD WITHOUT CONTRAST  TECHNIQUE: Multiplanar, multiecho pulse sequences of the brain and surrounding structures were obtained without intravenous contrast. Angiographic images of the head were obtained using MRA technique without contrast.  COMPARISON:  Prior CT from earlier the same day.  FINDINGS: MRI HEAD FINDINGS  Diffuse prominence of the CSF containing spaces is compatible with advanced generalized cerebral atrophy. Patchy and confluent T2/FLAIR hyperintensity within the periventricular and deep white matter most consistent with chronic small vessel ischemic disease. Small vessel changes present within the pons as well. Remote bilateral cerebellar infarcts noted.  There are scattered foci of restricted diffusion involving the cortical gray matter of the posterior right frontal lobe, compatible with acute right MCA territory infarct. Single small infarct present within the right parietal lobe is well. No associated hemorrhage or significant mass effect. Gray-white matter differentiation otherwise maintained. No other infarct.  No mass lesion or midline shift. No extra-axial fluid collection. Ventricular prominence related  global parenchymal volume loss present without hydrocephalus.  Craniocervical junction within normal limits. Pituitary gland normal.  No acute abnormality seen about the orbits.  Paranasal sinuses are clear.  No mastoid effusion.  Bone marrow signal intensity normal. Scattered mild degenerative changes noted within the visualized upper cervical spine. Scalp soft tissues within normal limits.  MRA HEAD FINDINGS  ANTERIOR CIRCULATION:  Study degraded by motion artifact.  Visualized portions of the distal cervical segments of the internal carotid arteries are widely patent with antegrade flow. Petrous segments widely patent. Multi focal atherosclerotic irregularity present within the cavernous carotid arteries bilaterally, slightly worse on the right. There is likely associated least mild stenoses, although evaluation of this region somewhat limited due to motion artifact. Supraclinoid segments widely patent.  A1 segments patent bilaterally. The right A1 segment is hypoplastic. Multi focal wall atheromatous irregularity present within the anterior cerebral and middle cerebral arteries bilaterally without proximal branch occlusion or hemodynamically significant stenosis. Distal branch atherosclerotic irregularity present within the MCA branches bilaterally.  POSTERIOR CIRCULATION:  Both vertebral arteries are patent to the vertebrobasilar junction. The right vertebral artery is slightly dominant. Posterior inferior cerebral arteries are patent proximally. Basilar artery widely patent. Superior cerebral arteries patent. Posterior cerebral arteries well opacified without occlusion or significant stenosis. Delete that  No aneurysm or vascular malformation.  IMPRESSION: MRI HEAD IMPRESSION:  1. Few small scattered cortical ischemic infarcts involving the right MCA distribution as above. No associated hemorrhage or significant mass effect. 2. Remote bilateral cerebellar infarcts. 3. Advanced cerebral atrophy with chronic  microvascular ischemic disease.  MRA HEAD IMPRESSION:  1. No proximal branch occlusion or hemodynamically significant stenosis identified within the intracranial circulation. 2. Multi focal atheromatous irregularity involving the MCAs and ACAs as well as the cavernous carotid arteries bilaterally without significant stenosis or occlusion.   Electronically Signed   By: Jeannine Boga M.D.   On: 06/19/2014 01:18   Mr Jodene Nam Head/brain Wo Cm  06/19/2014   CLINICAL DATA:  Image evaluation for slurred speech and left facial droop. Evaluate for stroke.  EXAM: MRI HEAD WITHOUT CONTRAST  MRA HEAD WITHOUT CONTRAST  TECHNIQUE: Multiplanar, multiecho pulse sequences of the brain and surrounding structures were obtained without intravenous contrast. Angiographic images of the head were obtained using MRA technique without contrast.  COMPARISON:  Prior CT from earlier the same day.  FINDINGS: MRI HEAD FINDINGS  Diffuse prominence of the CSF containing spaces is compatible with advanced generalized cerebral atrophy. Patchy and confluent T2/FLAIR hyperintensity within the periventricular and deep  white matter most consistent with chronic small vessel ischemic disease. Small vessel changes present within the pons as well. Remote bilateral cerebellar infarcts noted.  There are scattered foci of restricted diffusion involving the cortical gray matter of the posterior right frontal lobe, compatible with acute right MCA territory infarct. Single small infarct present within the right parietal lobe is well. No associated hemorrhage or significant mass effect. Gray-white matter differentiation otherwise maintained. No other infarct.  No mass lesion or midline shift. No extra-axial fluid collection. Ventricular prominence related global parenchymal volume loss present without hydrocephalus.  Craniocervical junction within normal limits. Pituitary gland normal.  No acute abnormality seen about the orbits.  Paranasal sinuses are  clear.  No mastoid effusion.  Bone marrow signal intensity normal. Scattered mild degenerative changes noted within the visualized upper cervical spine. Scalp soft tissues within normal limits.  MRA HEAD FINDINGS  ANTERIOR CIRCULATION:  Study degraded by motion artifact.  Visualized portions of the distal cervical segments of the internal carotid arteries are widely patent with antegrade flow. Petrous segments widely patent. Multi focal atherosclerotic irregularity present within the cavernous carotid arteries bilaterally, slightly worse on the right. There is likely associated least mild stenoses, although evaluation of this region somewhat limited due to motion artifact. Supraclinoid segments widely patent.  A1 segments patent bilaterally. The right A1 segment is hypoplastic. Multi focal wall atheromatous irregularity present within the anterior cerebral and middle cerebral arteries bilaterally without proximal branch occlusion or hemodynamically significant stenosis. Distal branch atherosclerotic irregularity present within the MCA branches bilaterally.  POSTERIOR CIRCULATION:  Both vertebral arteries are patent to the vertebrobasilar junction. The right vertebral artery is slightly dominant. Posterior inferior cerebral arteries are patent proximally. Basilar artery widely patent. Superior cerebral arteries patent. Posterior cerebral arteries well opacified without occlusion or significant stenosis. Delete that  No aneurysm or vascular malformation.  IMPRESSION: MRI HEAD IMPRESSION:  1. Few small scattered cortical ischemic infarcts involving the right MCA distribution as above. No associated hemorrhage or significant mass effect. 2. Remote bilateral cerebellar infarcts. 3. Advanced cerebral atrophy with chronic microvascular ischemic disease.  MRA HEAD IMPRESSION:  1. No proximal branch occlusion or hemodynamically significant stenosis identified within the intracranial circulation. 2. Multi focal atheromatous  irregularity involving the MCAs and ACAs as well as the cavernous carotid arteries bilaterally without significant stenosis or occlusion.   Electronically Signed   By: Jeannine Boga M.D.   On: 06/19/2014 01:18    Microbiology: No results found for this or any previous visit (from the past 240 hour(s)).   Labs: Basic Metabolic Panel:  Recent Labs Lab 06/18/14 1755 06/18/14 1808  NA 138 138  K 3.8 3.8  CL 103 102  CO2 30  --   GLUCOSE 100* 101*  BUN 18 19  CREATININE 0.90 0.90  CALCIUM 9.6  --    Liver Function Tests:  Recent Labs Lab 06/18/14 1755  AST 20  ALT 11  ALKPHOS 125*  BILITOT 0.4  PROT 6.8  ALBUMIN 3.8   No results for input(s): LIPASE, AMYLASE in the last 168 hours. No results for input(s): AMMONIA in the last 168 hours. CBC:  Recent Labs Lab 06/18/14 1755 06/18/14 1808  WBC 6.6  --   NEUTROABS 4.2  --   HGB 9.9* 11.2*  HCT 32.4* 33.0*  MCV 80.0  --   PLT 242  --    Cardiac Enzymes: No results for input(s): CKTOTAL, CKMB, CKMBINDEX, TROPONINI in the last 168 hours. BNP: BNP (last 3  results) No results for input(s): BNP in the last 8760 hours.  ProBNP (last 3 results) No results for input(s): PROBNP in the last 8760 hours.  CBG:  Recent Labs Lab 06/20/14 1114 06/20/14 1738 06/20/14 2054 06/21/14 0025 06/21/14 0409  GLUCAP 107* 111* 111* 119* 106*       Signed:  Koltan Portocarrero A  [facility] 06/21/2014, 11:17 AM

## 2014-06-21 NOTE — Social Work (Signed)
CSW facilitated patient discharge.  No further CSW needs.  Christene Lye MSW, LCSW

## 2014-06-24 ENCOUNTER — Non-Acute Institutional Stay (SKILLED_NURSING_FACILITY): Payer: Medicare Other | Admitting: Internal Medicine

## 2014-06-24 DIAGNOSIS — I10 Essential (primary) hypertension: Secondary | ICD-10-CM | POA: Diagnosis not present

## 2014-06-24 DIAGNOSIS — I4891 Unspecified atrial fibrillation: Secondary | ICD-10-CM | POA: Diagnosis not present

## 2014-06-24 DIAGNOSIS — I63511 Cerebral infarction due to unspecified occlusion or stenosis of right middle cerebral artery: Secondary | ICD-10-CM | POA: Diagnosis not present

## 2014-06-24 DIAGNOSIS — F411 Generalized anxiety disorder: Secondary | ICD-10-CM | POA: Diagnosis not present

## 2014-06-24 NOTE — Progress Notes (Signed)
MRN: [id number] Name: Jacqueline [name]  Sex: female Age: 81 y.o. DOB: Jun 10, 1928  [place] #: [name] Facility/Room: 114 Level Of Care: SNF Provider: Inocencio Homes D Emergency Contacts: Extended Emergency Contact Information Primary Emergency Contact: Marlowe Alt of [place] Mobile Phone: [phone] Relation: Son Secondary Emergency Contact: Hildebran,Flake Address: [address]          [place],  [place] Mobile Phone: [phone] Relation: Son  Code Status: FULL  Allergies: Metoclopramide hcl; Penicillins; Aspirin; and Zetia  Chief Complaint  Patient presents with  . New Admit To SNF    HPI: Patient is 81 y.o. female who was admitted for R MCA stroke with L facial sx, admitted to SNF for OT/PT.  Past Medical History  Diagnosis Date  . Allergy, unspecified not elsewhere classified   . Other dyspnea and respiratory abnormality   . Other diseases of lung, not elsewhere classified   . Anemia, unspecified   . Unspecified adverse effect of other drug, medicinal and biological substance(995.29)   . Blood in stool   . Hemorrhage of gastrointestinal tract, unspecified   . Personal history of peptic ulcer disease   . Anxiety state, unspecified   . Personal history of other diseases of digestive system   . Unspecified essential hypertension   . Complication of anesthesia     "hard to wake me up after colonoscopy" (03/13/12)  . H/O hiatal hernia   . GERD (gastroesophageal reflux disease)   . Unspecified paranoid state   . Atrial fibrillation with rapid ventricular response 03/13/12  . Osteoarthrosis, unspecified whether generalized or localized, lower leg   . Degeneration of lumbar or lumbosacral intervertebral disc   . Unspecified cerebral artery occlusion with cerebral infarction 10/2008    "mini stroke" denies residual (03/13/12)  . History of IBS     Past Surgical History  Procedure Laterality Date  . Pilonidal cyst / sinus excision  1957   . Dilation and curettage of uterus  1950's?  . Shoulder arthroscopy  ~ 2000    right  . Orif humerus fracture Right 04/03/2014    Procedure: OPEN REDUCTION INTERNAL FIXATION (ORIF) RIGHT HUMERUS DISTAL FRACTURE;  Surgeon: Roseanne Kaufman, MD;  Location: [facility];  Service: Orthopedics;  Laterality: Right;      Medication List       This list is accurate as of: 06/24/14 11:59 PM.  Always use your most recent med list.               ALPRAZolam 0.25 MG tablet  Commonly known as:  XANAX  Take 0.5 tablets (0.125 mg total) by mouth every 6 (six) hours.     apixaban 5 MG Tabs tablet  Commonly known as:  ELIQUIS  Take 1 tablet (5 mg total) by mouth 2 (two) times daily.     atorvastatin 20 MG tablet  Commonly known as:  LIPITOR  Take 1 tablet (20 mg total) by mouth daily at 6 PM.     CENTRUM SILVER PO  Take 1 tablet by mouth daily.     cloNIDine 0.1 MG tablet  Commonly known as:  CATAPRES  Take 1 tablet (0.1 mg total) by mouth every 2 (two) hours as needed (take as needed for a  Blood pressure that is greater than 160 on top, or bottom number greater than 100.).     DILTIAZEM HCL ER PO  Take 180 mg by mouth 2 (two) times daily. Before meals     furosemide 40 MG  tablet  Commonly known as:  LASIX  Take 1 tablet (40 mg total) by mouth daily.     HYDROcodone-acetaminophen 7.5-325 MG per tablet  Commonly known as:  NORCO  Take 1 tablet by mouth every 6 (six) hours as needed for moderate pain.     metoprolol succinate 50 MG 24 hr tablet  Commonly known as:  TOPROL-XL  TAKE 1 TABLET (50 MG TOTAL) BY MOUTH 2 (TWO) TIMES DAILY. TAKE WITH OR IMMEDIATELY FOLLOWING A MEAL.     pantoprazole 40 MG tablet  Commonly known as:  PROTONIX  TAKE 1 TABLET EVERY MORNING     potassium chloride SA 20 MEQ tablet  Commonly known as:  K-DUR,KLOR-CON  TAKE 1 TABLET EVERY DAY     simethicone 80 MG chewable tablet  Commonly known as:  MYLICON  Chew 1 tablet (80 mg total) by mouth 4 (four) times  daily as needed for flatulence.     Vitamin D3 5000 UNITS Caps  Take 5,000 Units by mouth daily.        No orders of the defined types were placed in this encounter.    Immunization History  Administered Date(s) Administered  . Influenza Split 01/24/2011, 02/14/2012  . Influenza Whole 01/28/2009  . Influenza,inj,Quad PF,36+ Mos 01/14/2013, 04/05/2014  . Pneumococcal Polysaccharide-23 01/28/2009  . Tdap 03/31/2014  . Zoster 01/24/2011    History  Substance Use Topics  . Smoking status: Former Smoker -- 1.00 packs/day for 40 years    Types: Cigarettes    Quit date: 05/02/2002  . Smokeless tobacco: Never Used  . Alcohol Use: No    Family history is noncontributory    Review of Systems  DATA OBTAINED: from patient, nurse GENERAL:  no fevers, fatigue, appetite changes SKIN: No itching, rash or wounds EYES: No eye pain, redness, discharge EARS: No earache, tinnitus, change in hearing NOSE: No congestion, drainage or bleeding  MOUTH/THROAT: No mouth or tooth pain, No sore throat RESPIRATORY: No cough, wheezing, SOB CARDIAC: No chest pain, palpitations, lower extremity edema  GI: No abdominal pain, No N/V/D or constipation, No heartburn or reflux  GU: No dysuria, frequency or urgency, or incontinence  MUSCULOSKELETAL: No unrelieved bone/joint pain NEUROLOGIC: No headache, dizziness or focal weakness PSYCHIATRIC: No overt anxiety or sadness, No behavior issue.   Filed Vitals:   06/25/14 0854  BP: 104/59  Pulse: 78  Temp: 97.5 F (36.4 C)  Resp: 18    Physical Exam  GENERAL APPEARANCE: Alert, conversant,  No acute distress.  SKIN: No diaphoresis rash HEAD: Normocephalic, atraumatic  EYES: Conjunctiva/lids clear. Pupils round, reactive. EOMs intact.  EARS: External exam WNL, canals clear. Hearing grossly normal.  NOSE: No deformity or discharge.  MOUTH/THROAT: Lips w/o lesions  RESPIRATORY: Breathing is even, unlabored. Lung sounds are clear   CARDIOVASCULAR:  Heart RRR no murmurs, rubs or gallops. No peripheral edema.   GASTROINTESTINAL: Abdomen is soft, non-tender, not distended w/ normal bowel sounds. GENITOURINARY: Bladder non tender, not distended  MUSCULOSKELETAL: No abnormal joints or musculature NEUROLOGIC:  Cranial nerves 2-12 grossly intact. Moves all extremities , no facial deficits noted PSYCHIATRIC: Mood and affect appropriate to situation, no behavioral issues  Patient Active Problem List   Diagnosis Date Noted  . Cerebral infarction due to embolism of right middle cerebral artery   . HLD (hyperlipidemia)   . Acute ischemic right MCA stroke 06/18/2014  . Facial droop   . Displaced fracture of shaft of left humerus 04/03/2014  . Right arm fracture 04/01/2014  .  Displaced spiral fracture of shaft of humerus   . Peripheral arterial disease 10/03/2013  . Chronic diastolic CHF (congestive heart failure) 08/08/2013  . Chronic venous insufficiency 04/02/2013  . Hypokalemia 01/13/2013  . PAF (paroxysmal atrial fibrillation) 10/03/2012  . CAD (coronary artery disease) of artery bypass graft 04/05/2012  . Atrial fibrillation with RVR 03/13/2012  . Need for prophylactic vaccination and inoculation against influenza 02/14/2012  . ABDOMINAL WALL HERNIA 01/21/2010  . PARESTHESIA 05/04/2009  . SKIN LESION 01/28/2009  . Nocturia 01/28/2009  . DYSPNEA 01/01/2009  . CVA (cerebral infarction) 11/17/2008  . ALLERGY 10/27/2008  . PAROXYSMAL NOCTURNAL DYSPNEA 10/03/2008  . Osteoarthrosis, unspecified whether generalized or localized, involving lower leg 09/03/2008  . RESTRICTIVE LUNG DISEASE 05/26/2008  . Anemia 01/08/2008  . UNS ADVRS EFF OTH RX MEDICINAL&BIOLOGICAL SBSTNC 11/09/2007  . HEMATOCHEZIA 07/18/2007  . PARANOIA 06/01/2007  . Anxiety state 01/23/2007  . Essential hypertension 01/23/2007  . GI BLEEDING 01/23/2007  . Osteoarthritis 01/23/2007  . DEGENERATIVE DISC DISEASE, LUMBAR SPINE 01/23/2007  . History of peptic ulcer  disease 01/23/2007  . Personal History of Other Diseases of Digestive Disease 01/23/2007    CBC    Component Value Date/Time   WBC 6.6 06/18/2014 1755   RBC 4.05 06/18/2014 1755   HGB 11.2* 06/18/2014 1808   HCT 33.0* 06/18/2014 1808   PLT 242 06/18/2014 1755   MCV 80.0 06/18/2014 1755   LYMPHSABS 1.7 06/18/2014 1755   MONOABS 0.6 06/18/2014 1755   EOSABS 0.1 06/18/2014 1755   BASOSABS 0.0 06/18/2014 1755    CMP     Component Value Date/Time   NA 138 06/18/2014 1808   K 3.8 06/18/2014 1808   CL 102 06/18/2014 1808   CO2 30 06/18/2014 1755   GLUCOSE 101* 06/18/2014 1808   BUN 19 06/18/2014 1808   CREATININE 0.90 06/18/2014 1808   CALCIUM 9.6 06/18/2014 1755   PROT 6.8 06/18/2014 1755   ALBUMIN 3.8 06/18/2014 1755   AST 20 06/18/2014 1755   ALT 11 06/18/2014 1755   ALKPHOS 125* 06/18/2014 1755   BILITOT 0.4 06/18/2014 1755   GFRNONAA 58* 06/18/2014 1755   GFRAA 67* 06/18/2014 1755    Assessment and Plan  Acute ischemic right MCA stroke R MCA acute ischemic stroke -patient not candidate for TPA due to anticoagulation with xarelto, minimal deficits, and time since onset. Stroke work up pathway MRI positive.  PT/OT, need SNF.  Advance diet as tolerated xarelto change to eliquis by neurology due to renal function. And will allow patient to be on therapeutic dose to prevent stroke.  HbA 1c at 5.6. LD 93   Essential hypertension Pt back on all pre hosp med-catapres prn, dilt BID, metoptolo XR BIDl, lasix   Atrial fibrillation with RVR Rate control with diltiazem and metoprolol; prophylaxis , now with eliquis   Anxiety state D/c note said med was changed to q 6 PRN but med list listed it as before, scheduled;will leave scheduled     Hennie Duos, MD

## 2014-06-25 ENCOUNTER — Encounter: Payer: Self-pay | Admitting: Internal Medicine

## 2014-06-25 NOTE — Assessment & Plan Note (Signed)
D/c note said med was changed to q 6 PRN but med list listed it as before, scheduled;will leave scheduled

## 2014-06-25 NOTE — Assessment & Plan Note (Signed)
R MCA acute ischemic stroke -patient not candidate for TPA due to anticoagulation with xarelto, minimal deficits, and time since onset. Stroke work up pathway MRI positive.  PT/OT, need SNF.  Advance diet as tolerated xarelto change to eliquis by neurology due to renal function. And will allow patient to be on therapeutic dose to prevent stroke.  HbA 1c at 5.6. LD 93

## 2014-06-25 NOTE — Assessment & Plan Note (Signed)
Pt back on all pre hosp med-catapres prn, dilt BID, metoptolo XR BIDl, lasix

## 2014-06-25 NOTE — Assessment & Plan Note (Signed)
Rate control with diltiazem and metoprolol; prophylaxis , now with eliquis

## 2014-07-08 ENCOUNTER — Encounter: Payer: Self-pay | Admitting: Internal Medicine

## 2014-07-08 ENCOUNTER — Non-Acute Institutional Stay (SKILLED_NURSING_FACILITY): Payer: Medicare Other | Admitting: Internal Medicine

## 2014-07-08 DIAGNOSIS — I63411 Cerebral infarction due to embolism of right middle cerebral artery: Secondary | ICD-10-CM | POA: Diagnosis not present

## 2014-07-08 DIAGNOSIS — I110 Hypertensive heart disease with heart failure: Secondary | ICD-10-CM

## 2014-07-08 DIAGNOSIS — E785 Hyperlipidemia, unspecified: Secondary | ICD-10-CM

## 2014-07-08 DIAGNOSIS — I509 Heart failure, unspecified: Secondary | ICD-10-CM

## 2014-07-08 DIAGNOSIS — I4891 Unspecified atrial fibrillation: Secondary | ICD-10-CM

## 2014-07-08 DIAGNOSIS — F411 Generalized anxiety disorder: Secondary | ICD-10-CM | POA: Diagnosis not present

## 2014-07-08 NOTE — Progress Notes (Signed)
MRN: [id number] Name: Jacqueline [name]  Sex: female Age: 81 y.o. DOB: 10/24/1928  [place] #: [facility] Facility/Room:103 Level Of Care: SNF Provider: Inocencio Homes D Emergency Contacts: Extended Emergency Contact Information Primary Emergency Contact: Marlowe Alt of [place] Mobile Phone: [phone] Relation: Son Secondary Emergency Contact: Fletes,Flake Address: [address]          [place], [place] Mobile Phone: [phone] Relation: Son  Code Status: FULL  Allergies: Metoclopramide hcl; Penicillins; Aspirin; and Zetia  Chief Complaint  Patient presents with  . Discharge Note    HPI: Patient is 81 y.o. female who was admitted to SNF after a MCA stroke for OT/PT who is ready to be discharged to home.  Past Medical History  Diagnosis Date  . Allergy, unspecified not elsewhere classified   . Other dyspnea and respiratory abnormality   . Other diseases of lung, not elsewhere classified   . Anemia, unspecified   . Unspecified adverse effect of other drug, medicinal and biological substance(995.29)   . Blood in stool   . Hemorrhage of gastrointestinal tract, unspecified   . Personal history of peptic ulcer disease   . Anxiety state, unspecified   . Personal history of other diseases of digestive system   . Unspecified essential hypertension   . Complication of anesthesia     "hard to wake me up after colonoscopy" (03/13/12)  . H/O hiatal hernia   . GERD (gastroesophageal reflux disease)   . Unspecified paranoid state   . Atrial fibrillation with rapid ventricular response 03/13/12  . Osteoarthrosis, unspecified whether generalized or localized, lower leg   . Degeneration of lumbar or lumbosacral intervertebral disc   . Unspecified cerebral artery occlusion with cerebral infarction 10/2008    "mini stroke" denies residual (03/13/12)  . History of IBS     Past Surgical History  Procedure Laterality Date  . Pilonidal cyst / sinus  excision  1957  . Dilation and curettage of uterus  1950's?  . Shoulder arthroscopy  ~ 2000    right  . Orif humerus fracture Right 04/03/2014    Procedure: OPEN REDUCTION INTERNAL FIXATION (ORIF) RIGHT HUMERUS DISTAL FRACTURE;  Surgeon: Roseanne Kaufman, MD;  Location: [facility];  Service: Orthopedics;  Laterality: Right;      Medication List       This list is accurate as of: 07/08/14  1:02 PM.  Always use your most recent med list.               ALPRAZolam 0.25 MG tablet  Commonly known as:  XANAX  Take 0.5 tablets (0.125 mg total) by mouth every 6 (six) hours.     apixaban 5 MG Tabs tablet  Commonly known as:  ELIQUIS  Take 1 tablet (5 mg total) by mouth 2 (two) times daily.     atorvastatin 20 MG tablet  Commonly known as:  LIPITOR  Take 1 tablet (20 mg total) by mouth daily at 6 PM.     CENTRUM SILVER PO  Take 1 tablet by mouth daily.     cloNIDine 0.1 MG tablet  Commonly known as:  CATAPRES  Take 1 tablet (0.1 mg total) by mouth every 2 (two) hours as needed (take as needed for a  Blood pressure that is greater than 160 on top, or bottom number greater than 100.).     DILTIAZEM HCL ER PO  Take 180 mg by mouth 2 (two) times daily. Before meals     furosemide 40  MG tablet  Commonly known as:  LASIX  Take 1 tablet (40 mg total) by mouth daily.     HYDROcodone-acetaminophen 7.5-325 MG per tablet  Commonly known as:  NORCO  Take 1 tablet by mouth every 6 (six) hours as needed for moderate pain.     metoprolol succinate 50 MG 24 hr tablet  Commonly known as:  TOPROL-XL  TAKE 1 TABLET (50 MG TOTAL) BY MOUTH 2 (TWO) TIMES DAILY. TAKE WITH OR IMMEDIATELY FOLLOWING A MEAL.     pantoprazole 40 MG tablet  Commonly known as:  PROTONIX  TAKE 1 TABLET EVERY MORNING     potassium chloride SA 20 MEQ tablet  Commonly known as:  K-DUR,KLOR-CON  TAKE 1 TABLET EVERY DAY     simethicone 80 MG chewable tablet  Commonly known as:  MYLICON  Chew 1 tablet (80 mg total) by mouth 4  (four) times daily as needed for flatulence.     Vitamin D3 5000 UNITS Caps  Take 5,000 Units by mouth daily.        No orders of the defined types were placed in this encounter.    Immunization History  Administered Date(s) Administered  . Influenza Split 01/24/2011, 02/14/2012  . Influenza Whole 01/28/2009  . Influenza,inj,Quad PF,36+ Mos 01/14/2013, 04/05/2014  . Pneumococcal Polysaccharide-23 01/28/2009  . Tdap 03/31/2014  . Zoster 01/24/2011    History  Substance Use Topics  . Smoking status: Former Smoker -- 1.00 packs/day for 40 years    Types: Cigarettes    Quit date: 05/02/2002  . Smokeless tobacco: Never Used  . Alcohol Use: No    Filed Vitals:   07/08/14 1257  BP: 132/68  Pulse: 74  Temp: 98 F (36.7 C)  Resp: 18    Physical Exam  GENERAL APPEARANCE: Alert, conversant. No acute distress.  HEENT: Unremarkable. RESPIRATORY: Breathing is even, unlabored. Lung sounds are clear   CARDIOVASCULAR: Heart RRR no murmurs, rubs or gallops. No peripheral edema.  GASTROINTESTINAL: Abdomen is soft, non-tender, not distended w/ normal bowel sounds.  NEUROLOGIC: Cranial nerves 2-12 grossly intact. Moves all extremities  Patient Active Problem List   Diagnosis Date Noted  . Cerebral infarction due to embolism of right middle cerebral artery   . HLD (hyperlipidemia)   . Acute ischemic right MCA stroke 06/18/2014  . Facial droop   . Displaced fracture of shaft of left humerus 04/03/2014  . Right arm fracture 04/01/2014  . Displaced spiral fracture of shaft of humerus   . Peripheral arterial disease 10/03/2013  . Chronic diastolic CHF (congestive heart failure) 08/08/2013  . Chronic venous insufficiency 04/02/2013  . Hypokalemia 01/13/2013  . PAF (paroxysmal atrial fibrillation) 10/03/2012  . CAD (coronary artery disease) of artery bypass graft 04/05/2012  . Atrial fibrillation with RVR 03/13/2012  . Need for prophylactic vaccination and inoculation against  influenza 02/14/2012  . ABDOMINAL WALL HERNIA 01/21/2010  . PARESTHESIA 05/04/2009  . SKIN LESION 01/28/2009  . Nocturia 01/28/2009  . DYSPNEA 01/01/2009  . CVA (cerebral infarction) 11/17/2008  . ALLERGY 10/27/2008  . PAROXYSMAL NOCTURNAL DYSPNEA 10/03/2008  . Osteoarthrosis, unspecified whether generalized or localized, involving lower leg 09/03/2008  . RESTRICTIVE LUNG DISEASE 05/26/2008  . Anemia 01/08/2008  . UNS ADVRS EFF OTH RX MEDICINAL&BIOLOGICAL SBSTNC 11/09/2007  . HEMATOCHEZIA 07/18/2007  . PARANOIA 06/01/2007  . Anxiety state 01/23/2007  . Hypertensive heart disease with CHF 01/23/2007  . GI BLEEDING 01/23/2007  . Osteoarthritis 01/23/2007  . DEGENERATIVE DISC DISEASE, LUMBAR SPINE 01/23/2007  .  History of peptic ulcer disease 01/23/2007  . Personal History of Other Diseases of Digestive Disease 01/23/2007    CBC    Component Value Date/Time   WBC 6.6 06/18/2014 1755   RBC 4.05 06/18/2014 1755   HGB 11.2* 06/18/2014 1808   HCT 33.0* 06/18/2014 1808   PLT 242 06/18/2014 1755   MCV 80.0 06/18/2014 1755   LYMPHSABS 1.7 06/18/2014 1755   MONOABS 0.6 06/18/2014 1755   EOSABS 0.1 06/18/2014 1755   BASOSABS 0.0 06/18/2014 1755    CMP     Component Value Date/Time   NA 138 06/18/2014 1808   K 3.8 06/18/2014 1808   CL 102 06/18/2014 1808   CO2 30 06/18/2014 1755   GLUCOSE 101* 06/18/2014 1808   BUN 19 06/18/2014 1808   CREATININE 0.90 06/18/2014 1808   CALCIUM 9.6 06/18/2014 1755   PROT 6.8 06/18/2014 1755   ALBUMIN 3.8 06/18/2014 1755   AST 20 06/18/2014 1755   ALT 11 06/18/2014 1755   ALKPHOS 125* 06/18/2014 1755   BILITOT 0.4 06/18/2014 1755   GFRNONAA 58* 06/18/2014 1755   GFRAA 67* 06/18/2014 1755    Assessment and Plan  Pt is stable for discharge to home with HH/OT/PT/nursing.  Hennie Duos, MD

## 2014-07-11 DIAGNOSIS — M179 Osteoarthritis of knee, unspecified: Secondary | ICD-10-CM | POA: Diagnosis not present

## 2014-07-11 DIAGNOSIS — I10 Essential (primary) hypertension: Secondary | ICD-10-CM | POA: Diagnosis not present

## 2014-07-11 DIAGNOSIS — I2510 Atherosclerotic heart disease of native coronary artery without angina pectoris: Secondary | ICD-10-CM | POA: Diagnosis not present

## 2014-07-11 DIAGNOSIS — M5136 Other intervertebral disc degeneration, lumbar region: Secondary | ICD-10-CM | POA: Diagnosis not present

## 2014-07-11 DIAGNOSIS — S42401D Unspecified fracture of lower end of right humerus, subsequent encounter for fracture with routine healing: Secondary | ICD-10-CM | POA: Diagnosis not present

## 2014-07-11 DIAGNOSIS — I5032 Chronic diastolic (congestive) heart failure: Secondary | ICD-10-CM | POA: Diagnosis not present

## 2014-07-16 DIAGNOSIS — I5032 Chronic diastolic (congestive) heart failure: Secondary | ICD-10-CM | POA: Diagnosis not present

## 2014-07-16 DIAGNOSIS — S42401D Unspecified fracture of lower end of right humerus, subsequent encounter for fracture with routine healing: Secondary | ICD-10-CM | POA: Diagnosis not present

## 2014-07-16 DIAGNOSIS — M179 Osteoarthritis of knee, unspecified: Secondary | ICD-10-CM | POA: Diagnosis not present

## 2014-07-16 DIAGNOSIS — I2510 Atherosclerotic heart disease of native coronary artery without angina pectoris: Secondary | ICD-10-CM | POA: Diagnosis not present

## 2014-07-16 DIAGNOSIS — I10 Essential (primary) hypertension: Secondary | ICD-10-CM | POA: Diagnosis not present

## 2014-07-16 DIAGNOSIS — M5136 Other intervertebral disc degeneration, lumbar region: Secondary | ICD-10-CM | POA: Diagnosis not present

## 2014-07-18 DIAGNOSIS — I10 Essential (primary) hypertension: Secondary | ICD-10-CM | POA: Diagnosis not present

## 2014-07-18 DIAGNOSIS — M5136 Other intervertebral disc degeneration, lumbar region: Secondary | ICD-10-CM | POA: Diagnosis not present

## 2014-07-18 DIAGNOSIS — M179 Osteoarthritis of knee, unspecified: Secondary | ICD-10-CM | POA: Diagnosis not present

## 2014-07-18 DIAGNOSIS — S42401D Unspecified fracture of lower end of right humerus, subsequent encounter for fracture with routine healing: Secondary | ICD-10-CM | POA: Diagnosis not present

## 2014-07-18 DIAGNOSIS — I2510 Atherosclerotic heart disease of native coronary artery without angina pectoris: Secondary | ICD-10-CM | POA: Diagnosis not present

## 2014-07-18 DIAGNOSIS — I5032 Chronic diastolic (congestive) heart failure: Secondary | ICD-10-CM | POA: Diagnosis not present

## 2014-07-22 DIAGNOSIS — I2510 Atherosclerotic heart disease of native coronary artery without angina pectoris: Secondary | ICD-10-CM | POA: Diagnosis not present

## 2014-07-22 DIAGNOSIS — M179 Osteoarthritis of knee, unspecified: Secondary | ICD-10-CM | POA: Diagnosis not present

## 2014-07-22 DIAGNOSIS — M5136 Other intervertebral disc degeneration, lumbar region: Secondary | ICD-10-CM | POA: Diagnosis not present

## 2014-07-22 DIAGNOSIS — I10 Essential (primary) hypertension: Secondary | ICD-10-CM | POA: Diagnosis not present

## 2014-07-22 DIAGNOSIS — I5032 Chronic diastolic (congestive) heart failure: Secondary | ICD-10-CM | POA: Diagnosis not present

## 2014-07-22 DIAGNOSIS — S42401D Unspecified fracture of lower end of right humerus, subsequent encounter for fracture with routine healing: Secondary | ICD-10-CM | POA: Diagnosis not present

## 2014-07-24 DIAGNOSIS — M179 Osteoarthritis of knee, unspecified: Secondary | ICD-10-CM | POA: Diagnosis not present

## 2014-07-24 DIAGNOSIS — S42401D Unspecified fracture of lower end of right humerus, subsequent encounter for fracture with routine healing: Secondary | ICD-10-CM | POA: Diagnosis not present

## 2014-07-24 DIAGNOSIS — M5136 Other intervertebral disc degeneration, lumbar region: Secondary | ICD-10-CM | POA: Diagnosis not present

## 2014-07-24 DIAGNOSIS — I10 Essential (primary) hypertension: Secondary | ICD-10-CM | POA: Diagnosis not present

## 2014-07-24 DIAGNOSIS — I5032 Chronic diastolic (congestive) heart failure: Secondary | ICD-10-CM | POA: Diagnosis not present

## 2014-07-24 DIAGNOSIS — I2510 Atherosclerotic heart disease of native coronary artery without angina pectoris: Secondary | ICD-10-CM | POA: Diagnosis not present

## 2014-07-29 DIAGNOSIS — I2510 Atherosclerotic heart disease of native coronary artery without angina pectoris: Secondary | ICD-10-CM | POA: Diagnosis not present

## 2014-07-29 DIAGNOSIS — M5136 Other intervertebral disc degeneration, lumbar region: Secondary | ICD-10-CM | POA: Diagnosis not present

## 2014-07-29 DIAGNOSIS — M179 Osteoarthritis of knee, unspecified: Secondary | ICD-10-CM | POA: Diagnosis not present

## 2014-07-29 DIAGNOSIS — I10 Essential (primary) hypertension: Secondary | ICD-10-CM | POA: Diagnosis not present

## 2014-07-29 DIAGNOSIS — I5032 Chronic diastolic (congestive) heart failure: Secondary | ICD-10-CM | POA: Diagnosis not present

## 2014-07-29 DIAGNOSIS — S42401D Unspecified fracture of lower end of right humerus, subsequent encounter for fracture with routine healing: Secondary | ICD-10-CM | POA: Diagnosis not present

## 2014-07-30 DIAGNOSIS — I10 Essential (primary) hypertension: Secondary | ICD-10-CM | POA: Diagnosis not present

## 2014-07-30 DIAGNOSIS — I5032 Chronic diastolic (congestive) heart failure: Secondary | ICD-10-CM | POA: Diagnosis not present

## 2014-07-30 DIAGNOSIS — I2510 Atherosclerotic heart disease of native coronary artery without angina pectoris: Secondary | ICD-10-CM | POA: Diagnosis not present

## 2014-07-30 DIAGNOSIS — M5136 Other intervertebral disc degeneration, lumbar region: Secondary | ICD-10-CM | POA: Diagnosis not present

## 2014-07-30 DIAGNOSIS — S42401D Unspecified fracture of lower end of right humerus, subsequent encounter for fracture with routine healing: Secondary | ICD-10-CM | POA: Diagnosis not present

## 2014-07-30 DIAGNOSIS — M179 Osteoarthritis of knee, unspecified: Secondary | ICD-10-CM | POA: Diagnosis not present

## 2014-07-31 DIAGNOSIS — I10 Essential (primary) hypertension: Secondary | ICD-10-CM | POA: Diagnosis not present

## 2014-07-31 DIAGNOSIS — S42401D Unspecified fracture of lower end of right humerus, subsequent encounter for fracture with routine healing: Secondary | ICD-10-CM | POA: Diagnosis not present

## 2014-07-31 DIAGNOSIS — M5136 Other intervertebral disc degeneration, lumbar region: Secondary | ICD-10-CM | POA: Diagnosis not present

## 2014-07-31 DIAGNOSIS — I2510 Atherosclerotic heart disease of native coronary artery without angina pectoris: Secondary | ICD-10-CM | POA: Diagnosis not present

## 2014-07-31 DIAGNOSIS — I5032 Chronic diastolic (congestive) heart failure: Secondary | ICD-10-CM | POA: Diagnosis not present

## 2014-07-31 DIAGNOSIS — M179 Osteoarthritis of knee, unspecified: Secondary | ICD-10-CM | POA: Diagnosis not present

## 2014-08-05 DIAGNOSIS — I5032 Chronic diastolic (congestive) heart failure: Secondary | ICD-10-CM | POA: Diagnosis not present

## 2014-08-05 DIAGNOSIS — S42401D Unspecified fracture of lower end of right humerus, subsequent encounter for fracture with routine healing: Secondary | ICD-10-CM | POA: Diagnosis not present

## 2014-08-05 DIAGNOSIS — I2510 Atherosclerotic heart disease of native coronary artery without angina pectoris: Secondary | ICD-10-CM | POA: Diagnosis not present

## 2014-08-05 DIAGNOSIS — M179 Osteoarthritis of knee, unspecified: Secondary | ICD-10-CM | POA: Diagnosis not present

## 2014-08-05 DIAGNOSIS — M5136 Other intervertebral disc degeneration, lumbar region: Secondary | ICD-10-CM | POA: Diagnosis not present

## 2014-08-05 DIAGNOSIS — I10 Essential (primary) hypertension: Secondary | ICD-10-CM | POA: Diagnosis not present

## 2014-08-06 DIAGNOSIS — M5136 Other intervertebral disc degeneration, lumbar region: Secondary | ICD-10-CM | POA: Diagnosis not present

## 2014-08-06 DIAGNOSIS — I5032 Chronic diastolic (congestive) heart failure: Secondary | ICD-10-CM | POA: Diagnosis not present

## 2014-08-06 DIAGNOSIS — I2510 Atherosclerotic heart disease of native coronary artery without angina pectoris: Secondary | ICD-10-CM | POA: Diagnosis not present

## 2014-08-06 DIAGNOSIS — I10 Essential (primary) hypertension: Secondary | ICD-10-CM | POA: Diagnosis not present

## 2014-08-06 DIAGNOSIS — M179 Osteoarthritis of knee, unspecified: Secondary | ICD-10-CM | POA: Diagnosis not present

## 2014-08-06 DIAGNOSIS — S42401D Unspecified fracture of lower end of right humerus, subsequent encounter for fracture with routine healing: Secondary | ICD-10-CM | POA: Diagnosis not present

## 2014-08-07 DIAGNOSIS — I10 Essential (primary) hypertension: Secondary | ICD-10-CM | POA: Diagnosis not present

## 2014-08-07 DIAGNOSIS — S42401D Unspecified fracture of lower end of right humerus, subsequent encounter for fracture with routine healing: Secondary | ICD-10-CM | POA: Diagnosis not present

## 2014-08-07 DIAGNOSIS — I2510 Atherosclerotic heart disease of native coronary artery without angina pectoris: Secondary | ICD-10-CM | POA: Diagnosis not present

## 2014-08-07 DIAGNOSIS — I5032 Chronic diastolic (congestive) heart failure: Secondary | ICD-10-CM | POA: Diagnosis not present

## 2014-08-07 DIAGNOSIS — M179 Osteoarthritis of knee, unspecified: Secondary | ICD-10-CM | POA: Diagnosis not present

## 2014-08-07 DIAGNOSIS — M5136 Other intervertebral disc degeneration, lumbar region: Secondary | ICD-10-CM | POA: Diagnosis not present

## 2014-08-13 DIAGNOSIS — Z79899 Other long term (current) drug therapy: Secondary | ICD-10-CM | POA: Diagnosis not present

## 2014-08-13 DIAGNOSIS — I482 Chronic atrial fibrillation: Secondary | ICD-10-CM | POA: Diagnosis not present

## 2014-08-13 DIAGNOSIS — Z0000 Encounter for general adult medical examination without abnormal findings: Secondary | ICD-10-CM | POA: Diagnosis not present

## 2014-08-13 DIAGNOSIS — D539 Nutritional anemia, unspecified: Secondary | ICD-10-CM | POA: Diagnosis not present

## 2014-08-13 DIAGNOSIS — E780 Pure hypercholesterolemia: Secondary | ICD-10-CM | POA: Diagnosis not present

## 2014-08-13 DIAGNOSIS — Z1389 Encounter for screening for other disorder: Secondary | ICD-10-CM | POA: Diagnosis not present

## 2014-08-13 DIAGNOSIS — I129 Hypertensive chronic kidney disease with stage 1 through stage 4 chronic kidney disease, or unspecified chronic kidney disease: Secondary | ICD-10-CM | POA: Diagnosis not present

## 2014-09-10 DIAGNOSIS — H5213 Myopia, bilateral: Secondary | ICD-10-CM | POA: Diagnosis not present

## 2014-09-10 DIAGNOSIS — H2510 Age-related nuclear cataract, unspecified eye: Secondary | ICD-10-CM | POA: Diagnosis not present

## 2014-09-10 DIAGNOSIS — H43819 Vitreous degeneration, unspecified eye: Secondary | ICD-10-CM | POA: Diagnosis not present

## 2014-09-10 DIAGNOSIS — H4320 Crystalline deposits in vitreous body, unspecified eye: Secondary | ICD-10-CM | POA: Diagnosis not present

## 2014-10-06 ENCOUNTER — Ambulatory Visit: Payer: Medicare Other | Admitting: Neurology

## 2014-10-09 ENCOUNTER — Ambulatory Visit: Payer: Medicare Other | Admitting: Neurology

## 2014-11-12 DIAGNOSIS — E611 Iron deficiency: Secondary | ICD-10-CM | POA: Diagnosis not present

## 2014-11-12 DIAGNOSIS — D649 Anemia, unspecified: Secondary | ICD-10-CM | POA: Diagnosis not present

## 2014-11-12 DIAGNOSIS — I10 Essential (primary) hypertension: Secondary | ICD-10-CM | POA: Diagnosis not present

## 2014-11-12 DIAGNOSIS — I482 Chronic atrial fibrillation: Secondary | ICD-10-CM | POA: Diagnosis not present

## 2014-11-13 ENCOUNTER — Encounter: Payer: Self-pay | Admitting: Internal Medicine

## 2014-11-13 ENCOUNTER — Encounter: Payer: Self-pay | Admitting: Gastroenterology

## 2015-04-03 DIAGNOSIS — Z23 Encounter for immunization: Secondary | ICD-10-CM | POA: Diagnosis not present

## 2015-10-07 DIAGNOSIS — H2513 Age-related nuclear cataract, bilateral: Secondary | ICD-10-CM | POA: Diagnosis not present

## 2015-10-07 DIAGNOSIS — H25043 Posterior subcapsular polar age-related cataract, bilateral: Secondary | ICD-10-CM | POA: Diagnosis not present

## 2015-10-07 DIAGNOSIS — H4322 Crystalline deposits in vitreous body, left eye: Secondary | ICD-10-CM | POA: Diagnosis not present

## 2015-10-07 DIAGNOSIS — H52223 Regular astigmatism, bilateral: Secondary | ICD-10-CM | POA: Diagnosis not present

## 2015-10-07 DIAGNOSIS — H5213 Myopia, bilateral: Secondary | ICD-10-CM | POA: Diagnosis not present

## 2015-10-28 DIAGNOSIS — H04123 Dry eye syndrome of bilateral lacrimal glands: Secondary | ICD-10-CM | POA: Diagnosis not present

## 2015-10-28 DIAGNOSIS — H25813 Combined forms of age-related cataract, bilateral: Secondary | ICD-10-CM | POA: Diagnosis not present

## 2015-10-28 DIAGNOSIS — H4322 Crystalline deposits in vitreous body, left eye: Secondary | ICD-10-CM | POA: Diagnosis not present

## 2015-11-19 DIAGNOSIS — H2512 Age-related nuclear cataract, left eye: Secondary | ICD-10-CM | POA: Diagnosis not present

## 2015-11-19 DIAGNOSIS — H268 Other specified cataract: Secondary | ICD-10-CM | POA: Diagnosis not present

## 2015-11-19 DIAGNOSIS — H25812 Combined forms of age-related cataract, left eye: Secondary | ICD-10-CM | POA: Diagnosis not present

## 2015-12-23 DIAGNOSIS — I10 Essential (primary) hypertension: Secondary | ICD-10-CM | POA: Diagnosis not present

## 2015-12-23 DIAGNOSIS — M25512 Pain in left shoulder: Secondary | ICD-10-CM | POA: Diagnosis not present

## 2015-12-23 DIAGNOSIS — F419 Anxiety disorder, unspecified: Secondary | ICD-10-CM | POA: Diagnosis not present

## 2015-12-23 DIAGNOSIS — Z1389 Encounter for screening for other disorder: Secondary | ICD-10-CM | POA: Diagnosis not present

## 2015-12-23 DIAGNOSIS — D508 Other iron deficiency anemias: Secondary | ICD-10-CM | POA: Diagnosis not present

## 2015-12-23 DIAGNOSIS — G8929 Other chronic pain: Secondary | ICD-10-CM | POA: Diagnosis not present

## 2015-12-23 DIAGNOSIS — M25511 Pain in right shoulder: Secondary | ICD-10-CM | POA: Diagnosis not present

## 2015-12-23 DIAGNOSIS — I482 Chronic atrial fibrillation: Secondary | ICD-10-CM | POA: Diagnosis not present

## 2015-12-23 DIAGNOSIS — Z79899 Other long term (current) drug therapy: Secondary | ICD-10-CM | POA: Diagnosis not present

## 2016-01-14 DIAGNOSIS — Z23 Encounter for immunization: Secondary | ICD-10-CM | POA: Diagnosis not present

## 2016-07-04 DIAGNOSIS — H04123 Dry eye syndrome of bilateral lacrimal glands: Secondary | ICD-10-CM | POA: Diagnosis not present

## 2016-07-04 DIAGNOSIS — H4322 Crystalline deposits in vitreous body, left eye: Secondary | ICD-10-CM | POA: Diagnosis not present

## 2016-07-04 DIAGNOSIS — H35011 Changes in retinal vascular appearance, right eye: Secondary | ICD-10-CM | POA: Diagnosis not present

## 2016-07-04 DIAGNOSIS — H25813 Combined forms of age-related cataract, bilateral: Secondary | ICD-10-CM | POA: Diagnosis not present

## 2016-07-04 DIAGNOSIS — H40013 Open angle with borderline findings, low risk, bilateral: Secondary | ICD-10-CM | POA: Diagnosis not present

## 2016-07-04 DIAGNOSIS — H3561 Retinal hemorrhage, right eye: Secondary | ICD-10-CM | POA: Diagnosis not present

## 2016-07-04 DIAGNOSIS — H35031 Hypertensive retinopathy, right eye: Secondary | ICD-10-CM | POA: Diagnosis not present

## 2016-07-04 DIAGNOSIS — Z961 Presence of intraocular lens: Secondary | ICD-10-CM | POA: Diagnosis not present

## 2016-10-12 DIAGNOSIS — M545 Low back pain: Secondary | ICD-10-CM | POA: Diagnosis not present

## 2016-10-12 DIAGNOSIS — I10 Essential (primary) hypertension: Secondary | ICD-10-CM | POA: Diagnosis not present

## 2016-10-12 DIAGNOSIS — F419 Anxiety disorder, unspecified: Secondary | ICD-10-CM | POA: Diagnosis not present

## 2016-10-12 DIAGNOSIS — G894 Chronic pain syndrome: Secondary | ICD-10-CM | POA: Diagnosis not present

## 2016-10-12 DIAGNOSIS — I482 Chronic atrial fibrillation: Secondary | ICD-10-CM | POA: Diagnosis not present

## 2016-10-12 DIAGNOSIS — H6120 Impacted cerumen, unspecified ear: Secondary | ICD-10-CM | POA: Diagnosis not present

## 2017-02-06 ENCOUNTER — Other Ambulatory Visit: Payer: Self-pay | Admitting: Geriatric Medicine

## 2017-02-06 DIAGNOSIS — Z23 Encounter for immunization: Secondary | ICD-10-CM | POA: Diagnosis not present

## 2017-02-06 DIAGNOSIS — R471 Dysarthria and anarthria: Secondary | ICD-10-CM | POA: Diagnosis not present

## 2017-02-06 DIAGNOSIS — Z79899 Other long term (current) drug therapy: Secondary | ICD-10-CM | POA: Diagnosis not present

## 2017-02-06 DIAGNOSIS — M25561 Pain in right knee: Secondary | ICD-10-CM | POA: Diagnosis not present

## 2017-02-06 DIAGNOSIS — R410 Disorientation, unspecified: Secondary | ICD-10-CM | POA: Diagnosis not present

## 2017-02-06 DIAGNOSIS — I482 Chronic atrial fibrillation: Secondary | ICD-10-CM | POA: Diagnosis not present

## 2017-02-06 DIAGNOSIS — R413 Other amnesia: Secondary | ICD-10-CM | POA: Diagnosis not present

## 2017-02-06 DIAGNOSIS — I10 Essential (primary) hypertension: Secondary | ICD-10-CM | POA: Diagnosis not present

## 2017-02-08 ENCOUNTER — Other Ambulatory Visit: Payer: Self-pay | Admitting: *Deleted

## 2017-02-08 NOTE — Patient Outreach (Signed)
[facility]) Care Management  02/08/2017  Jacqueline [name] 26-May-1928 [id number]   Telephone Screen  Referral Date: 02/07/17 Referral Source: Dr. Lajean Manes Referral Reason: Patient needs a Education officer, museum and nurse. Patient has gait imbalance. Insurance: [place], [place]   Outreach attempt to patient. HIPAA verified with patient. Patient gave permission to speak with her son Jacqueline [name]). [facility] services and benefits explained to [name]. Jacqueline [name] reported, he and his sister spoke with a Museum/gallery curator. Jacqueline [name] stated, he and his sister plans to discuss patient's options. He plans to call back after their discussion, per Jacqueline [name].   Plan: RN CM will contact patient within one week.    [name], RN, BSN, MHA/MSL, [place] Telephonic Care Manager Coordinator [facility] Direct Phone: [phone] Toll Free: [phone] Fax: [phone]

## 2017-02-13 ENCOUNTER — Encounter (HOSPITAL_COMMUNITY): Payer: Self-pay | Admitting: Emergency Medicine

## 2017-02-13 ENCOUNTER — Emergency Department (HOSPITAL_COMMUNITY): Payer: Medicare Other

## 2017-02-13 ENCOUNTER — Emergency Department (HOSPITAL_COMMUNITY)
Admission: EM | Admit: 2017-02-13 | Discharge: 2017-02-14 | Disposition: A | Discharge status: Home / Self Care | Payer: Medicare Other | Attending: Physician Assistant | Admitting: Physician Assistant

## 2017-02-13 DIAGNOSIS — I110 Hypertensive heart disease with heart failure: Secondary | ICD-10-CM | POA: Diagnosis not present

## 2017-02-13 DIAGNOSIS — Z7901 Long term (current) use of anticoagulants: Secondary | ICD-10-CM | POA: Insufficient documentation

## 2017-02-13 DIAGNOSIS — R410 Disorientation, unspecified: Secondary | ICD-10-CM | POA: Diagnosis not present

## 2017-02-13 DIAGNOSIS — R531 Weakness: Secondary | ICD-10-CM | POA: Diagnosis not present

## 2017-02-13 DIAGNOSIS — Z87891 Personal history of nicotine dependence: Secondary | ICD-10-CM | POA: Diagnosis not present

## 2017-02-13 DIAGNOSIS — I2510 Atherosclerotic heart disease of native coronary artery without angina pectoris: Secondary | ICD-10-CM | POA: Diagnosis not present

## 2017-02-13 DIAGNOSIS — M25561 Pain in right knee: Secondary | ICD-10-CM | POA: Insufficient documentation

## 2017-02-13 DIAGNOSIS — K449 Diaphragmatic hernia without obstruction or gangrene: Secondary | ICD-10-CM | POA: Diagnosis not present

## 2017-02-13 DIAGNOSIS — I5032 Chronic diastolic (congestive) heart failure: Secondary | ICD-10-CM | POA: Insufficient documentation

## 2017-02-13 DIAGNOSIS — R296 Repeated falls: Secondary | ICD-10-CM | POA: Diagnosis not present

## 2017-02-13 DIAGNOSIS — Z79899 Other long term (current) drug therapy: Secondary | ICD-10-CM | POA: Diagnosis not present

## 2017-02-13 LAB — URINALYSIS, ROUTINE W REFLEX MICROSCOPIC
Bilirubin Urine: NEGATIVE
Glucose, UA: NEGATIVE mg/dL
Hgb urine dipstick: NEGATIVE
Ketones, ur: NEGATIVE mg/dL
LEUKOCYTES UA: NEGATIVE
Nitrite: NEGATIVE
PROTEIN: NEGATIVE mg/dL
SPECIFIC GRAVITY, URINE: 1.015 (ref 1.005–1.030)
pH: 8 (ref 5.0–8.0)

## 2017-02-13 LAB — COMPREHENSIVE METABOLIC PANEL
ALBUMIN: 4.2 g/dL (ref 3.5–5.0)
ALT: 13 U/L — AB (ref 14–54)
AST: 23 U/L (ref 15–41)
Alkaline Phosphatase: 72 U/L (ref 38–126)
Anion gap: 7 (ref 5–15)
BUN: 14 mg/dL (ref 6–20)
CHLORIDE: 101 mmol/L (ref 101–111)
CO2: 32 mmol/L (ref 22–32)
CREATININE: 1 mg/dL (ref 0.44–1.00)
Calcium: 10 mg/dL (ref 8.9–10.3)
GFR calc Af Amer: 57 mL/min — ABNORMAL LOW (ref >60)
GFR calc non Af Amer: 50 mL/min — ABNORMAL LOW (ref >60)
GLUCOSE: 102 mg/dL — AB (ref 65–99)
POTASSIUM: 5.1 mmol/L (ref 3.5–5.1)
SODIUM: 140 mmol/L (ref 135–145)
Total Bilirubin: 0.7 mg/dL (ref 0.3–1.2)
Total Protein: 7.1 g/dL (ref 6.5–8.1)

## 2017-02-13 LAB — CBC WITH DIFFERENTIAL/PLATELET
BASOS ABS: 0 10*3/uL (ref 0.0–0.1)
BASOS PCT: 0 %
EOS PCT: 1 %
Eosinophils Absolute: 0 10*3/uL (ref 0.0–0.7)
HCT: 46.2 % — ABNORMAL HIGH (ref 36.0–46.0)
Hemoglobin: 15.3 g/dL — ABNORMAL HIGH (ref 12.0–15.0)
Lymphocytes Relative: 24 %
Lymphs Abs: 1.2 10*3/uL (ref 0.7–4.0)
MCH: 32.6 pg (ref 26.0–34.0)
MCHC: 33.1 g/dL (ref 30.0–36.0)
MCV: 98.5 fL (ref 78.0–100.0)
Monocytes Absolute: 0.4 10*3/uL (ref 0.1–1.0)
Monocytes Relative: 8 %
NEUTROS ABS: 3.3 10*3/uL (ref 1.7–7.7)
Neutrophils Relative %: 67 %
PLATELETS: 209 10*3/uL (ref 150–400)
RBC: 4.69 MIL/uL (ref 3.87–5.11)
RDW: 14.1 % (ref 11.5–15.5)
WBC: 4.9 10*3/uL (ref 4.0–10.5)

## 2017-02-13 LAB — APTT: APTT: 42 s — AB (ref 24–36)

## 2017-02-13 LAB — PROTIME-INR
INR: 1.09
Prothrombin Time: 14 seconds (ref 11.4–15.2)

## 2017-02-13 LAB — I-STAT TROPONIN, ED: TROPONIN I, POC: 0.01 ng/mL (ref 0.00–0.08)

## 2017-02-13 LAB — ETHANOL: Alcohol, Ethyl (B): <10 mg/dL (ref <10)

## 2017-02-13 MED ORDER — DICLOFENAC SODIUM 1 % TD GEL: 2.0000 g | Freq: Four times a day (QID) | TRANSDERMAL | 0 refills | Status: DC

## 2017-02-13 MED ORDER — LIDOCAINE 5 % EX PTCH
1.0000 | MEDICATED_PATCH | CUTANEOUS | Status: DC
  Administered 2017-02-13: 1 via TRANSDERMAL
  Filled 2017-02-13: qty 1

## 2017-02-13 NOTE — ED Notes (Signed)
Helped pt to bedpan to void (this was her preference over getting up)

## 2017-02-13 NOTE — ED Triage Notes (Signed)
Per PTAR son requesting evaluation for right knee pain with standing; unable to ambulate; right knee pain from previous injury but has had recent falls at home.

## 2017-02-13 NOTE — ED Notes (Signed)
ED Provider at bedside. 

## 2017-02-13 NOTE — ED Notes (Signed)
Bed: WHALA Expected date:  Expected time:  Means of arrival:  Comments: 

## 2017-02-13 NOTE — ED Notes (Signed)
Attempt to ambulate pt., per order unsuccessful.  After using a bedpan, pt was ask to sit on edge of bed.  When ask to stand, pt sts she felt weak and sat back down on the bed.  On next attempt pt was able to stand with assistance but when ask to walk she was unable. RN notified.

## 2017-02-13 NOTE — ED Notes (Signed)
EDPA at bedside speaking with patient

## 2017-02-13 NOTE — ED Notes (Signed)
Patient transported to X-ray 

## 2017-02-13 NOTE — ED Notes (Signed)
Patient unable to walk with walker. PA made aware.

## 2017-02-13 NOTE — ED Notes (Signed)
Bed: WA29 Expected date:  Expected time:  Means of arrival:  Comments: Room 8

## 2017-02-13 NOTE — Discharge Instructions (Addendum)
As discussed, apply the topical gel to your right knee and wear your knee sleeve for support. Follow up with your primary care provider for further evaluation and more advanced imaging as indicated.  Your xray did not show any acute fractures or dislocation to explain your symptoms. Case manager will be contacting you in the morning with options for home health. Return if symptoms worsen in the meantime.

## 2017-02-13 NOTE — ED Notes (Signed)
Patient transported to CT 

## 2017-02-13 NOTE — ED Notes (Signed)
Patient to DC home via PTAR, called on the way

## 2017-02-13 NOTE — ED Provider Notes (Signed)
[facility] DEPT Provider Note   CSN: [id number] Arrival date & time: 02/13/17  1411     History   Chief Complaint Chief Complaint  Patient presents with  . Knee Pain    HPI Jacqueline [name] is a 83 y.o. female Presenting with acute on chronic right knee pain worsening today. She explains that she has been having difficulty making it to the bathroom and urinating prior to making it to the bathroom due to difficulty ambulating with her walker. She denies loss of bladder function. She reports that she normally takes hydrocodone for her knee pain. She has taken her normal dose today with improvement. Her pain is aggravated with any weight bearing on her right leg. No pain at rest, no numbness. She denies any urinary symptoms, dysuria, hematuria, frequency, denies fever, chills.  HPI  Past Medical History:  Diagnosis Date  . Allergy, unspecified not elsewhere classified   . Anemia, unspecified   . Anxiety state, unspecified   . Atrial fibrillation with rapid ventricular response ([place]) 03/13/12  . Blood in stool   . Complication of anesthesia    "hard to wake me up after colonoscopy" (03/13/12)  . Degeneration of lumbar or lumbosacral intervertebral disc   . GERD (gastroesophageal reflux disease)   . H/O hiatal hernia   . Hemorrhage of gastrointestinal tract, unspecified   . History of IBS   . Osteoarthrosis, unspecified whether generalized or localized, lower leg   . Other diseases of lung, not elsewhere classified   . Other dyspnea and respiratory abnormality   . Personal history of other diseases of digestive system   . Personal history of peptic ulcer disease   . Unspecified adverse effect of other drug, medicinal and biological substance(995.29)   . Unspecified cerebral artery occlusion with cerebral infarction 10/2008   "mini stroke" denies residual (03/13/12)  . Unspecified essential hypertension   . Unspecified paranoid state     Patient  Active Problem List   Diagnosis Date Noted  . Cerebral infarction due to embolism of right middle cerebral artery ([place])   . HLD (hyperlipidemia)   . Acute ischemic right MCA stroke ([place]) 06/18/2014  . Facial droop   . Displaced fracture of shaft of left humerus 04/03/2014  . Right arm fracture 04/01/2014  . Displaced spiral fracture of shaft of humerus   . Peripheral arterial disease ([place]) 10/03/2013  . Chronic diastolic CHF (congestive heart failure) ([place]) 08/08/2013  . Chronic venous insufficiency 04/02/2013  . Hypokalemia 01/13/2013  . PAF (paroxysmal atrial fibrillation) ([place]) 10/03/2012  . CAD (coronary artery disease) of artery bypass graft 04/05/2012  . Atrial fibrillation with RVR ([place]) 03/13/2012  . Need for prophylactic vaccination and inoculation against influenza 02/14/2012  . ABDOMINAL WALL HERNIA 01/21/2010  . PARESTHESIA 05/04/2009  . SKIN LESION 01/28/2009  . Nocturia 01/28/2009  . DYSPNEA 01/01/2009  . CVA (cerebral infarction) 11/17/2008  . ALLERGY 10/27/2008  . PAROXYSMAL NOCTURNAL DYSPNEA 10/03/2008  . Osteoarthrosis, unspecified whether generalized or localized, involving lower leg 09/03/2008  . RESTRICTIVE LUNG DISEASE 05/26/2008  . Anemia 01/08/2008  . UNS ADVRS EFF OTH RX MEDICINAL&BIOLOGICAL SBSTNC 11/09/2007  . HEMATOCHEZIA 07/18/2007  . PARANOIA 06/01/2007  . Anxiety state 01/23/2007  . Hypertensive heart disease with CHF ([place]) 01/23/2007  . GI BLEEDING 01/23/2007  . Osteoarthritis 01/23/2007  . DEGENERATIVE DISC DISEASE, LUMBAR SPINE 01/23/2007  . History of peptic ulcer disease 01/23/2007  . Personal history of other diseases of digestive system 01/23/2007    Past  Surgical History:  Procedure Laterality Date  . DILATION AND CURETTAGE OF UTERUS  1950's?  . ORIF HUMERUS FRACTURE Right 04/03/2014   Procedure: OPEN REDUCTION INTERNAL FIXATION (ORIF) RIGHT HUMERUS DISTAL FRACTURE;  Surgeon: Roseanne Kaufman, MD;  Location: [facility];  Service: Orthopedics;   Laterality: Right;  . PILONIDAL CYST / SINUS EXCISION  1957  . SHOULDER ARTHROSCOPY  ~ 2000   right    OB History    No data available       Home Medications    Prior to Admission medications   Medication Sig Start Date End Date Taking? Authorizing Provider  ALPRAZolam (XANAX) 0.25 MG tablet Take 0.5 tablets (0.125 mg total) by mouth every 6 (six) hours. 06/20/14  Yes Regalado, Belkys A, MD  ANUCORT-HC 25 MG suppository Place 25 mg rectally daily as needed. 12/01/16  Yes [provider]  apixaban (ELIQUIS) 5 MG TABS tablet Take 1 tablet (5 mg total) by mouth 2 (two) times daily. 06/20/14  Yes Regalado, Belkys A, MD  Cholecalciferol (VITAMIN D3) 5000 UNITS CAPS Take 5,000 Units by mouth daily.   Yes [provider]  diltiazem (TIAZAC) 180 MG 24 hr capsule Take 1 mg by mouth 2 (two) times daily. 01/11/17  Yes [provider]  HYDROcodone-acetaminophen (NORCO/VICODIN) 5-325 MG per tablet Take 1 tablet by mouth every 6 (six) hours as needed for moderate pain.   Yes [provider]  metoprolol succinate (TOPROL-XL) 50 MG 24 hr tablet TAKE 1 TABLET (50 MG TOTAL) BY MOUTH 2 (TWO) TIMES DAILY. TAKE WITH OR IMMEDIATELY FOLLOWING A MEAL. Patient taking differently: TAKE 1 TABLET (50 MG TOTAL) BY MOUTH 2 (TWO) TIMES DAILY. TAKE WITH OR IMMEDIATELY FOLLOWING A MEAL. Takes at 6 and 6 03/25/14  Yes Larey Dresser, MD  Multiple Vitamins-Minerals (CENTRUM SILVER PO) Take 1 tablet by mouth daily.   Yes [provider]  pantoprazole (PROTONIX) 40 MG tablet TAKE 1 TABLET EVERY MORNING   Yes Hendricks Limes, MD  PEDIALYTE (PEDIALYTE) SOLN Take 240 mLs by mouth daily. APPROX 6 TO 8 OUNCES TWICE DAILY   Yes [provider]  atorvastatin (LIPITOR) 20 MG tablet Take 1 tablet (20 mg total) by mouth daily at 6 PM. Patient not taking: Reported on 02/13/2017 06/20/14   Regalado, Jerald Kief A, MD  cloNIDine (CATAPRES) 0.1 MG tablet Take 1 tablet (0.1 mg total) by  mouth every 2 (two) hours as needed (take as needed for a  Blood pressure that is greater than 160 on top, or bottom number greater than 100.). Patient not taking: Reported on 02/13/2017 12/04/12   Norins, Heinz Knuckles, MD  diclofenac sodium (VOLTAREN) 1 % GEL Apply 2 g topically 4 (four) times daily. 02/13/17   Emeline General, PA-C  furosemide (LASIX) 40 MG tablet Take 1 tablet (40 mg total) by mouth daily. Patient not taking: Reported on 02/13/2017 01/31/14   Larey Dresser, MD  potassium chloride SA (K-DUR,KLOR-CON) 20 MEQ tablet TAKE 1 TABLET EVERY DAY Patient not taking: Reported on 02/13/2017 04/18/13   Neena Rhymes, MD  simethicone (MYLICON) 80 MG chewable tablet Chew 1 tablet (80 mg total) by mouth 4 (four) times daily as needed for flatulence. Patient not taking: Reported on 02/13/2017 06/20/14   Elmarie Shiley, MD    Family History Family History  Problem Relation Age of Onset  . Diabetes Mother   . CVA Mother   . CVA Father     Social History Social History  Substance  Use Topics  . Smoking status: Former Smoker    Packs/day: 1.00    Years: 40.00    Types: Cigarettes    Quit date: 05/02/2002  . Smokeless tobacco: Never Used  . Alcohol use No     Allergies   Metoclopramide hcl; Penicillins; Aspirin; and Zetia [ezetimibe]   Review of Systems Review of Systems  Constitutional: Negative for chills and fever.  Respiratory: Negative for shortness of breath and wheezing.   Cardiovascular: Negative for chest pain and leg swelling.  Gastrointestinal: Negative for nausea and vomiting.  Genitourinary: Negative for difficulty urinating, dysuria and hematuria.  Musculoskeletal: Positive for arthralgias. Negative for joint swelling and myalgias.  Skin: Negative for color change, pallor and wound.  Neurological: Negative for weakness and numbness.     Physical Exam Updated Vital Signs BP (!) 174/103 (BP Location: Left Arm)   Pulse 65   Temp 97.6 F (36.4 C)  (Oral)   Resp 20   SpO2 94%   Physical Exam  Constitutional: She appears well-developed and well-nourished. No distress.  Afebrile, nontoxic-appearing, lying comfortably in bed in no acute distress.  HENT:  Head: Normocephalic and atraumatic.  Neck: Normal range of motion.  Cardiovascular: Normal rate, regular rhythm and normal heart sounds.   No murmur heard. Pulmonary/Chest: Effort normal and breath sounds normal. No respiratory distress. She has no wheezes.  Abdominal: She exhibits no distension.  Musculoskeletal: Normal range of motion. She exhibits no edema, tenderness or deformity.  Knee joint is stable and patient has full range of motion.  Strong dorsalis pedis pulses  Neurological: She is alert. No sensory deficit. She exhibits normal muscle tone.  5 out of 5 strength in knee flexion and extension, plantar flexion dorsiflexion. Neurovascularly intact.  Skin: Skin is warm and dry. Capillary refill takes less than 2 seconds. She is not diaphoretic. No erythema. No pallor.  Psychiatric: She has a normal mood and affect.  Nursing note and vitals reviewed.    ED Treatments / Results  Labs (all labs ordered are listed, but only abnormal results are displayed) Labs Reviewed  CBC WITH DIFFERENTIAL/PLATELET - Abnormal; Notable for the following:       Result Value   Hemoglobin 15.3 (*)    HCT 46.2 (*)    All other components within normal limits  COMPREHENSIVE METABOLIC PANEL - Abnormal; Notable for the following:    Glucose, Bld 102 (*)    ALT 13 (*)    GFR calc non Af Amer 50 (*)    GFR calc Af Amer 57 (*)    All other components within normal limits  APTT - Abnormal; Notable for the following:    aPTT 42 (*)    All other components within normal limits  URINALYSIS, ROUTINE W REFLEX MICROSCOPIC  ETHANOL  PROTIME-INR  I-STAT TROPONIN, ED    EKG  EKG Interpretation  Date/Time:  Monday February 13 2017 16:51:09 EDT Ventricular Rate:  63 PR Interval:    QRS  Duration: 82 QT Interval:  409 QTC Calculation: 419 R Axis:   -13 Text Interpretation:  Sinus rhythm Confirmed by Milton Ferguson [phone]) on 02/13/2017 5:01:46 PM       Radiology Dg Chest 2 View  Result Date: 02/13/2017 CLINICAL DATA:  Generalized weakness, hypertension EXAM: CHEST  2 VIEW COMPARISON:  01/12/2013 FINDINGS: Stable cardiomegaly with aortic atherosclerosis. Double density over the cardiac silhouette consistent with known large hiatal hernia. No pneumonic consolidation, overt pulmonary edema nor effusion. No pneumothorax. There is atelectasis at  the lung bases more so on the right. Clothing artifacts project over the lung apices bilaterally. Remodeled appearance of the glenohumeral joints with bone-on-bone apposition of the humeral head with glenoid fossae as before. No acute nor suspicious osseous abnormalities. Degenerative changes are seen along the dorsal spine, stable in appearance. IMPRESSION: 1. Stable cardiomegaly with aortic atherosclerosis. 2. Large hiatal hernia. 3. No acute pulmonary disease. 4. Osteoarthritis of the glenohumeral joints bilaterally. Electronically Signed   By: Ashley Royalty M.D.   On: 02/13/2017 17:48   Ct Head Wo Contrast  Result Date: 02/13/2017 CLINICAL DATA:  83 year old female unable to ambulate. Recent falls. Initial encounter. EXAM: CT HEAD WITHOUT CONTRAST TECHNIQUE: Contiguous axial images were obtained from the base of the skull through the vertex without intravenous contrast. COMPARISON:  06/19/2014 brain MR. 06/18/2014 head CT. FINDINGS: Brain: No intracranial hemorrhage or CT evidence of large acute infarct. Remote small right frontal lobe infarcts. Chronic microvascular changes. Global atrophy. No intracranial mass lesion noted on this unenhanced exam. Vascular: Vascular calcifications. Skull: No skull fracture. Sinuses/Orbits: No acute orbital abnormality. Minimal mucosal thickening right maxillary sinus. Other: Mastoid air cells and middle ear  cavities are clear. IMPRESSION: No intracranial hemorrhage or CT evidence of large acute infarct. Remote right frontal lobe infarcts and chronic microvascular changes. Atrophy. Electronically Signed   By: Genia Del M.D.   On: 02/13/2017 17:42   Dg Knee Complete 4 Views Right  Result Date: 02/13/2017 CLINICAL DATA:  Patient states fall 30 days ago. Pain. Unable to bear weight. EXAM: RIGHT KNEE - COMPLETE 4+ VIEW COMPARISON:  None. FINDINGS: There is no evidence of fracture or dislocation. There is advanced tricompartmental degenerative change, worst laterally. Calcified patellar tendon. No patellar fracture. Vascular calcification. Osteopenia. IMPRESSION: Degenerative change.  No acute findings. Electronically Signed   By: Staci Righter M.D.   On: 02/13/2017 15:30    Procedures Procedures (including critical care time)  Medications Ordered in ED Medications  lidocaine (LIDODERM) 5 % 1 patch (1 patch Transdermal Patch Applied 02/13/17 1936)     Initial Impression / Assessment and Plan / ED Course  I have reviewed the triage vital signs and the nursing notes.  Pertinent labs & imaging results that were available during my care of the patient were reviewed by me and considered in my medical decision making (see chart for details).    Patient presenting with acute on chronic right knee pain. Reports that she fell about a month ago on her knee and today she woke up with difficulty putting any weight on her right knee. She also reports that she's having difficulty holding her urine making it to the bathroom which is due to her difficulty with ambulation. No loss of bladder function otherwise, no dysuria, hematuria, frequency.  She has full range of motion, no swelling, erythema, warmth, no tenderness palpation of the knee. She reports her pain is only with weightbearing. Patient did not require pain management at this time. She denies any pain and was comfortably lying in bed.  Ordered x-ray  given fall without evaluation Negative for acute findings  On reassessment, patient was sleeping comfortably.  Nursing attempted to ambulate patient in hall with walker- patient was able to stand but could not step forward or walk while in ED even with the use of a walker. SPo2 95 % on room air.  Patient's son was now at bedside stating that she has been having urinary symptoms similar to prior UTI and hasn't been able to ambulate.  He reports that she woke up with worsening weakness, slurred speech and difficulty with word findings. She is scheduled for an MRI outpatient on the 22nd by PCP about 4 weeks ago for similar symptoms but not as severe. He reports that her speech appears to have improved at this time.  Patient was discussed with Dr. Thomasene Lot who has also seen patient and agrees with assessment and plan. Patient will be discharged home with close PCP follow if CT head negative. CT head negative for acute findings, urine without any evidence of UTI. Labs unremarkable.  Discussed home health vs placement  Options with both sons and daughter in law at bedside  and family reported that they were starting to set up home health with her PCP.   Family was overwhelmed with her new inability to perform ADLs and reach the bathroom and requested assistance with placement to nursing facility. Patient was in agreement with consult to social work for more information on options for placement. Consult placed to social work who spoke to family who opted for 24 hour home health and will have her come home and speak with case manager in the morning. Attempted to ambulate patient again and she could only stand and fatigued. Unable to walk in ED.  Patient will be sent home by ambulance. Case management and home health in the morning. Patients son and daughter in law will be there with her tonight. Patient is otherwise well appearing and ready to go home. Final Clinical Impressions(s) / ED Diagnoses    Final diagnoses:  Acute pain of right knee  Generalized weakness    New Prescriptions New Prescriptions   DICLOFENAC SODIUM (VOLTAREN) 1 % GEL    Apply 2 g topically 4 (four) times daily.     Emeline General, PA-C 02/13/17 2354    Macarthur Critchley, MD 02/14/17 [phone]

## 2017-02-13 NOTE — ED Notes (Signed)
Bed: WA08 Expected date:  Expected time:  Means of arrival:  Comments: 

## 2017-02-13 NOTE — Progress Notes (Addendum)
CSW received consult from the EDP about possible placement due to family's wishes that pt be placed in a SNF.  CSW reviewed chart and noted pt has Medicare A & B and that three days of inpatient stay are required for Medicare A&B to pay for SNF placement.  CSW verified with the EDP that pt DOES  NOT meet inpatient criteria at this time.  CSW spoke to pt's son [name] who stated pt was at [facility] for 2 or more months approx 3-4 years ago and that this was a preference for the family for SNF placement.  CSW informed pt's son of three-day inpatient stay being required for Medicare A&B to pay for SNF.  CSW inquired if family wanted to pay private pay and [name] stated they prefer insurance pay rather than family. CSW educated family about possibility of using a 24 hour in-home aide and pt's son [name] stated this would possibly be preferable to private pay at a SNF.  CSW staffed case with the CSW Director who agreed pt is unable to be placed due insurance and possibly a non-skilleable need at this time.  CSW spoke to EDP and EDP placed Case Management consult with orders for home health, pt/ot, face-to-face and home health social work.  CSW stated to pt's son [name] he would attempt to find a 24 hour [place] number from the CM on-duty and would request pt's RN call [facility] to transport pt home as soon as pt's son arrived at the pt's home.  Pt's son stated he will call the CSW back to say he had arrived and was ready for his mother to arrive from the ED via [place].  11:35 PM CSW called pt's family and verified pt's address.  CSW spoke to pt's other son [name] who was at the pt home awaiting the pt and is aware [facility] is being called to transport pt home at this hour.  CSW informed pt's son [name] that ED had placed a consult for CM with orders for home health, pt/ot, face-to-face and home health social work and informed pt's son [name] CM will call him and/or his brother  [name] in the morning (10/16) to assist with Home Health, as well as with options for paying for an in-home aide privately.  Please reconsult if future social work needs arise.  CSW signing off, as social work intervention is no longer needed.  Jacqueline Guild. [name], LCSW, LCAS, CSI Clinical Social Worker Ph: [phone]

## 2017-02-14 ENCOUNTER — Other Ambulatory Visit: Payer: Self-pay | Admitting: *Deleted

## 2017-02-14 ENCOUNTER — Telehealth: Payer: Self-pay | Admitting: Emergency Medicine

## 2017-02-14 ENCOUNTER — Ambulatory Visit: Payer: Self-pay | Admitting: *Deleted

## 2017-02-14 DIAGNOSIS — M79609 Pain in unspecified limb: Secondary | ICD-10-CM | POA: Diagnosis not present

## 2017-02-14 DIAGNOSIS — R4182 Altered mental status, unspecified: Secondary | ICD-10-CM | POA: Diagnosis not present

## 2017-02-14 NOTE — Telephone Encounter (Signed)
CM consulted for HHS from ED D/C yesterday.  Reviewed chart and spoke to pt's son, Josph Macho, to discuss [facility] agencies. He states he is still confused about all the different options and what insurance will cover and won't cover.  He stated he was in the area today and would come up to the hospital for [place] to continue this conversation, get a [facility] agency list, and also attempt to explain the different options more thoroughly.

## 2017-02-14 NOTE — Patient Outreach (Addendum)
[facility]) Care Management  02/14/2017  Jacqueline [name] [birth year] [id number]  Telephone Screen  Referral Date: 02/07/17 Referral Source: Dr. Lajean Manes Referral Reason: Patient needs a Education officer, museum and nurse. Patient has gait imbalance. Insurance: [place], [employer]  Outreach attempt #2 to patient. Patient requested for RN CM to contact her son. Patient gave permission for RN CM to speak with her son about the referral received from Dr. Felipa Eth.    Update: Incoming call from patient's son Jacqueline [name]). Fred left a voicemail stating he will contact RN CM at a later time. He stated, "things have been hectic due to getting things arranged for his mother". Jacqueline [name] stated, family is in the process of getting a private company to assist with patient's care.  Plan: RN CM will contact patient within one week.   [name], RN, BSN, MHA/MSL, [place] Telephonic Care Manager Coordinator [facility] Direct Phone: [phone] Toll Free: [phone] Fax: [phone]

## 2017-02-16 ENCOUNTER — Telehealth: Payer: Self-pay | Admitting: Emergency Medicine

## 2017-02-16 NOTE — Telephone Encounter (Signed)
CM called by pts son to advise CM that pt and family have decided not to use HHS at this time.  No further CM needs noted.

## 2017-02-21 ENCOUNTER — Encounter: Payer: Self-pay | Admitting: *Deleted

## 2017-02-21 ENCOUNTER — Other Ambulatory Visit: Payer: Self-pay | Admitting: *Deleted

## 2017-02-21 ENCOUNTER — Ambulatory Visit: Payer: Self-pay | Admitting: *Deleted

## 2017-02-21 NOTE — Patient Outreach (Signed)
[facility]) Care Management  02/21/2017  Jacqueline [name] [birth year] [id number]  Telephone Screen  Referral Date: 02/07/17 Referral Source: Dr. Lajean Manes Referral Reason: Patient needs a Education officer, museum and nurse. Patient has gait imbalance. Insurance: [place], [place]  Outreach attempt #3 to patient.  No answer at present. RN CM left HIPAA compliant message along with contact info.     Update: Incoming call from patient's son Josph Macho). Josph Macho stated, he wants to speak with the patient regarding providing her personal information to RN CM. Josph Macho stated, the patient is aware of the referral to [facility] from Dr. Felipa Eth. Patient wants to put everything on hold, per Josph Macho. RN CM [place]) informed Josph Macho that a letter providing information about [facility] services along with RN CM contact information will be mailed to the patient. RN CM asked Josph Macho to contact [facility] for Jacqueline further assistance, if needed.    Plan: RN CM will send unsuccessful outreach letter to patient and will close case, if no response from patient within 10 business days.   [name], RN, BSN, MHA/MSL, [place] Telephonic Care Manager Coordinator [facility] Direct Phone: [phone] Toll Free: [phone] Fax: [phone]

## 2017-02-22 ENCOUNTER — Other Ambulatory Visit: Payer: BLUE CROSS/BLUE SHIELD

## 2017-02-23 ENCOUNTER — Other Ambulatory Visit: Payer: Self-pay | Admitting: *Deleted

## 2017-02-23 NOTE — Patient Outreach (Signed)
[facility]) Care Management  02/23/2017  Dejia Ebron 08/02/1928 [id number]  Telephone Screen  Referral Date:02/07/17 Referral Source: Dr. Lajean Manes Referral Reason: Patient needs a SW and nurse. She has a gait imbalance. Insurance: [employer]  Incoming call from patient's son, [name] and his siblings are requesting assistance with patient's medical care. Patient's children would like to speak with a [facility] Charity fundraiser. Her children have limited ability to assist patient. Flake stated, patient's health has declined and she is needing more assistance. Patient had an acute illness on 02/13/17 and she went to the emergency room (ER). Per [name], the hospital completed several tests and everything came back negative. The family decided to "bring mom back home". Patient is requiring 24 hour supervision. The family was seeking private paid personal care services, until Dr. Felipa Eth made a referral to [facility]. She had a fall recently with injury. Patient had a significant decline in mobility since the fall. She's had moments of confusion and difficulty with speaking.    Plan: RN CM advised patient to contact RNCM for any needs or concerns. RN CM will notify [facility] Case Management Assistant of case status. RN CM will send referral to [facility] RN for further in home eval/assessment of care needs and management of chronic conditions.   [name], RN, BSN, MHA/MSL, [place] Telephonic Care Manager Coordinator [facility] Direct Phone: [phone] Toll Free: [phone] Fax: [phone]

## 2017-02-27 ENCOUNTER — Other Ambulatory Visit: Payer: Self-pay | Admitting: *Deleted

## 2017-02-27 NOTE — Patient Outreach (Signed)
[facility]) Care Management  02/27/2017  Jacqueline [name] 1929/02/08 [id number]   RN initial outreach attempt however unsuccessful. RN able to leave a HIPAA approved voice message requesting a call back. Will rescheduled ongoing outreach calls accordingly.  [name], RN Care Management Coordinator [place] Office [phone]

## 2017-02-28 ENCOUNTER — Other Ambulatory Visit: Payer: Self-pay | Admitting: *Deleted

## 2017-02-28 NOTE — Patient Outreach (Signed)
[facility]) Care Management  02/28/2017  Jacqueline [name] 09-Nov-1928 [id number]   2nd outreach attempt unsuccessful however will scheduled another call tomorrow for pending [facility] services.  [name], RN Care Management Coordinator [place] Office [phone]

## 2017-03-01 ENCOUNTER — Encounter: Payer: Self-pay | Admitting: *Deleted

## 2017-03-01 ENCOUNTER — Other Ambulatory Visit: Payer: Self-pay | Admitting: *Deleted

## 2017-03-01 NOTE — Patient Outreach (Addendum)
[facility]) Care Management  03/01/2017  Jacqueline [name] [birth year] [id number]  Received on 10/26 via MD referral  Today was the 3 rd outreach attempt to contact this pt that was successful. RN spoke with pt and verified her identifiers and RN explained the purpose for today's call and inquired further on pt's possible needs. RN states she is doing well however reports issues with her mobility but uses a walker as her assisted device. Other areas discussed as noted:  HTN: Pt reports she use to take her BP daily however due to the anxiety her provider has informed her not to take her BP to reduce this stressor however pt indicates she is not aware of possible signs if her BP is to low or to high. RN discussed such signs and symptom as what to do if acute and will reiterated on these symptoms on future conversations however pt states this is controlled and feels this is not a subject that needs major focus.  Atrial Fibrillation: Pt not to familiar with this medical condition. Therefore RN discuss the condition and encourage pt if she experienced any of the F.A.S.T symptoms to report immediately to her provider or seek medical attention to reduce the risk of long term side affects (pt with clear understanding however will reiterated on this condition on future calls). Safety: RN discussed safety in the home and pt very receptive to this topic and requested a home visit to evaluate her home as discussed initial on this call. Pt states she fells she is being safe but willing to make adjustments to be even safer in her home. RN discussed assisted devices and verify pt's equipment in the home and focused a plan of care with generated goals around safety prevention (pt receptive).  Medications: RN inquired on medications and pt requested RN to review her medications on the initial home visit. States she is taking her medications as recommended with no problems.   RN able to completed the initial  assessment and care plan. Pt requested I make the arranges with setting up a home visit with her son [name] however her other son [name] entered the home and pt requested I speak with him concerning [facility]. RN again explained the purpose for today call and explained the topics of discussed that has taken place today along with option of arranging a home visit. Son [name]) provided RN contact and again directed to contact pt's other son [name] concerning arrangements for a home visit to further evaluate pt's home environment. RN will outreach to [place] with these arrangements today.   RN spoke with son [name]) concerning [facility] services and arranged the initial home visit for next week 11/6 as requested by the pt for an afternoon appointment. Will scheduled accordingly and offer an earlier appointment for this week if needed. Son very appreciative and indicated he is not sure what the pt needs but receptive to any recommendations or suggestions to assist the pt further. RN has explained that orders for DME or referral agencies are usually provided via pt's primary provider via prescriptions however [facility] communicates with the provider on pt's disposition with [facility] services. RN has informed son that this RN would be in contact with Dr. Felipa Eth  Primary provider concerning the involved care.    [name], RN Care Management Coordinator [place] Office [phone]

## 2017-03-07 ENCOUNTER — Other Ambulatory Visit: Payer: Self-pay | Admitting: *Deleted

## 2017-03-07 ENCOUNTER — Ambulatory Visit: Payer: Self-pay | Admitting: *Deleted

## 2017-03-07 NOTE — Patient Outreach (Addendum)
[facility]) Care Management   03/07/2017  Jacqueline [name] [birth year] [id number]  Alyze Lauf is an 83 y.o. female  Subjective:  CONSENT: Remains active with [facility] and willing to sign a consent for ongoing service. FALLS/SAFETY Pt reports she is doing well and has not had a falls since the last conversation. Pt states she uses her walker at all times and volunteered a walk through her home for any unsafe spaces. PT states she does not use her stove only the microwave.  MEDICATIONS: Pt provided all her medications and was able to go over all her medications in the presence of her son [name]) who is responsible for filling up her pill box. Pt reports being very organizied with her medications on the as needed medications by placing them in one place and in the event her BP is elevated or she has increased anxiety levels. Pt states she was taking her BP all at one time in the morning  and states she does get weak through out the early and mid day time. ATRIAL FIBRILLATION/HTN: Pt reports again this condition is managed with no encountered problems. Pt remains open to ongoing education to this medical condition. Pt reports her provider again has indicated not to take her BP daily due to her anxiety levels.    Objective:   Review of Systems  Constitutional: Negative.   HENT: Negative.   Eyes: Negative.   Respiratory: Negative.   Cardiovascular: Negative.   Gastrointestinal: Negative.   Genitourinary: Negative.   Musculoskeletal: Negative.   Skin: Negative.   Neurological: Negative.   Endo/Heme/Allergies: Negative.   Psychiatric/Behavioral: Negative.     Physical Exam  Constitutional: She is oriented to person, place, and time. She appears well-developed and well-nourished.  HENT:  Right Ear: External ear normal.  Left Ear: External ear normal.  Eyes: EOM are normal.  Neck: Normal range of motion.  Cardiovascular: Normal heart sounds.  Respiratory: Breath sounds normal.  GI:  Bowel sounds are normal.  Musculoskeletal: Normal range of motion.  Neurological: She is alert and oriented to person, place, and time.  Skin: Skin is warm and dry.  Psychiatric: She has a normal mood and affect. Her behavior is normal. Judgment and thought content normal.    Encounter Medications:   Outpatient Encounter Medications as of 03/07/2017  Medication Sig  . ALPRAZolam (XANAX) 0.25 MG tablet Take 0.5 tablets (0.125 mg total) by mouth every 6 (six) hours. (Patient taking differently: Take 0.125 mg every 6 (six) hours by mouth. Pt takes as needed)  . ANUCORT-HC 25 MG suppository Place 25 mg rectally daily as needed.  Marland Kitchen apixaban (ELIQUIS) 5 MG TABS tablet Take 1 tablet (5 mg total) by mouth 2 (two) times daily.  . Cholecalciferol (VITAMIN D3) 5000 UNITS CAPS Take 5,000 Units by mouth daily.  Marland Kitchen diltiazem (TIAZAC) 180 MG 24 hr capsule Take 1 mg by mouth 2 (two) times daily.  Marland Kitchen HYDROcodone-acetaminophen (NORCO/VICODIN) 5-325 MG per tablet Take 1 tablet by mouth every 6 (six) hours as needed for moderate pain.  . metoprolol succinate (TOPROL-XL) 50 MG 24 hr tablet TAKE 1 TABLET (50 MG TOTAL) BY MOUTH 2 (TWO) TIMES DAILY. TAKE WITH OR IMMEDIATELY FOLLOWING A MEAL. (Patient taking differently: TAKE 1 TABLET (50 MG TOTAL) BY MOUTH 2 (TWO) TIMES DAILY. TAKE WITH OR IMMEDIATELY FOLLOWING A MEAL. Takes at 6 and 6)  . Multiple Vitamins-Minerals (CENTRUM SILVER PO) Take 1 tablet by mouth daily.  . pantoprazole (PROTONIX) 40 MG tablet TAKE 1  TABLET EVERY MORNING  . PEDIALYTE (PEDIALYTE) SOLN Take 240 mLs by mouth daily. APPROX 6 TO 8 OUNCES TWICE DAILY  . senna (SENOKOT) 8.6 MG TABS tablet Take 1 tablet daily as needed by mouth for mild constipation.  Marland Kitchen atorvastatin (LIPITOR) 20 MG tablet Take 1 tablet (20 mg total) by mouth daily at 6 PM. (Patient not taking: Reported on 02/13/2017)  . cloNIDine (CATAPRES) 0.1 MG tablet Take 1 tablet (0.1 mg total) by mouth every 2 (two) hours as needed (take as  needed for a  Blood pressure that is greater than 160 on top, or bottom number greater than 100.). (Patient not taking: Reported on 02/13/2017)  . diclofenac sodium (VOLTAREN) 1 % GEL Apply 2 g topically 4 (four) times daily. (Patient not taking: Reported on 03/07/2017)  . furosemide (LASIX) 40 MG tablet Take 1 tablet (40 mg total) by mouth daily. (Patient not taking: Reported on 02/13/2017)  . potassium chloride SA (K-DUR,KLOR-CON) 20 MEQ tablet TAKE 1 TABLET EVERY DAY (Patient not taking: Reported on 02/13/2017)  . simethicone (MYLICON) 80 MG chewable tablet Chew 1 tablet (80 mg total) by mouth 4 (four) times daily as needed for flatulence. (Patient not taking: Reported on 02/13/2017)   No facility-administered encounter medications on file as of 03/07/2017.     Functional Status:   In your present state of health, do you have any difficulty performing the following activities: 03/01/2017  Hearing? N  Vision? N  Difficulty concentrating or making decisions? N  Walking or climbing stairs? Y  Dressing or bathing? N  Doing errands, shopping? Y  Some recent data might be hidden    Fall/Depression Screening:    Fall Risk  03/01/2017 02/23/2017  Falls in the past year? Yes Yes  Number falls in past yr: 1 1  Injury with Fall? Yes Yes  Risk Factor Category  High Fall Risk High Fall Risk  Risk for fall due to : Impaired balance/gait History of fall(s);Impaired mobility  Follow up Falls prevention discussed Falls evaluation completed   PHQ 2/9 Scores 03/01/2017 02/23/2017  PHQ - 2 Score 0 -  Exception Documentation - Other- indicate reason in comment box  Not completed - [place]ake answered screening questions.   BP 98/60 (BP Location: Right Arm, Patient Position: Sitting, Cuff Size: Normal)   Pulse 62   Resp 20   Ht 1.524 m (5')   Wt 162 lb (73.5 kg)   SpO2 96%   BMI 31.64 kg/m  Assessment:  [place] a consent form for [facility] services  Case manageetnt related to safety and fall  prevention Medication adherence History of Atrial Fibrillation/Hypotension self management of care   Plan:  Will verified pt identifiers confirmed with a signed consent obtained for [facility] services. Will discussed safety measures and review and discuss the EMMI educational programs related to [place] (pt with a clear understanding). Will also complete a walk through and complete a home safety evaluation with no threw rugs or obstacles throughout. Hallways well light areas with clear pathways that pt is able to use her rollator. Will encourage family and pt to remove high bar stools as pt unable to use anymore to prevent potential falls/injuries. Will discuss fall and safety prevention measures and the use of DME both inside and outside the home. Will review all medications and discussed the purpose of all her medications.  Also discuss one medication that indicates twice daily as pt reports taking both doses in the AM. RN encouraged pt to  take one dose AM and the second dose PM on this BP medication. Son [name]) has re-arranged pt's pill box to reflect twice daily dosage Will verify pt is managing her atrial fibrillation with reported issues however pt's BP was slight low today (due to pt taking her full dosage in the AM instead of twice daily AM/PM as ordered). Note will obtain bilateral BP with Right-98/60 and Left 27/52 asymptomatic however son reports pt has weakness through the day but no falls or injuries reported.  Plan of care discussed along with the goals as generated earlier telephonically once pt verbally agreed to [facility] services. WiIll provide pt with educational material and [facility] calendar with discussion on how to monitor her atrial fibrillation and BP. Will notify pt's provider of her disposition with [facility] services and update accordingly. Will also scheduled the next follow up call and home visit accordingly to pt's schedule.   [name], RN Care Management  Coordinator [place] Office [phone]

## 2017-03-09 ENCOUNTER — Encounter: Payer: Self-pay | Admitting: *Deleted

## 2017-03-09 ENCOUNTER — Other Ambulatory Visit: Payer: Self-pay | Admitting: *Deleted

## 2017-03-09 NOTE — Patient Outreach (Addendum)
[facility]) Care Management  03/09/2017  Delane Stalling 12/22/1928 [id number]  Telephone assessment-Acute visit due medication issues and cancelled while in route  RN received a call from pt this morning indicating she was confused on her medications therefore she did not take her morning dose of her medications. RN encouraged pt to look at her pillbox for Thursday dosage. Pt states no medications were in the box for that day. RN offered an acute visit as pt very relieved and receptive to a home visit this morning. Note RN also inquired on any available assistance from her sons [name] or [place]). Pt states both are out of town and not available. RN informed pt that RN would contact her son [name] with this information and inform of the acute visit to assist further today.   RN in route to pt's home and received a call from pt's son who indicated pt got slightly confused and was looking at the incorrect box but has found the correct day and box with administration for this morning medications with her son assisting over the phone. Son [name]) states pt has requested RN not to visit now that she has taken her medications. Son very Patent attorney and grateful. RN also received a call from the pt indicating she is "straight" now and states she has taken all her medications and aware of the next dosage on the correct pill box.   RN generated additional problem under the pt's plan of care which was discussed along with all the goals (pt with understanding). Will continue to review and follow up telephonically in a few weeks concerning pt's ongoing progress.  [name], RN Care Management Coordinator [place] Office [phone]

## 2017-03-15 ENCOUNTER — Other Ambulatory Visit: Payer: Self-pay | Admitting: *Deleted

## 2017-03-15 NOTE — Patient Outreach (Signed)
[facility]) Care Management  03/15/2017  Jacqueline [name] 1928-09-20 [id number]    RN received a call from pt and spoke with the son Flake who requested [facility] pill box to assist with pt's organizing her medication more efficiently RN encouraged son that RN can deliver on the next home visit or family member can go by the office and pick up the pillbox. Son indicated he would go by the office and pick the pill box up to get started on the refill of the pill box for pt to get use to the new process. RN also indicated if this does not resolve the pt's confusion a referral can take place with a [place] to better organize medication administration for the pt (receptive). Will follow up accordingly and incorporate this into the plan of care that is existing.  [name], RN Care Management Coordinator [place] Office [phone]

## 2017-03-20 ENCOUNTER — Other Ambulatory Visit: Payer: Self-pay | Admitting: *Deleted

## 2017-03-20 NOTE — Patient Outreach (Signed)
[facility]) Care Management  03/20/2017  Jacqueline [name] 1928/11/21 [id number]  Follow Up Call  RN followed up with pt and son today. Primary caregiver son Jacqueline [name]) indicates pt did not like the new pill box but resumed suing her old box and has been taking all her medications correctly with no delays. RN also spoke with pt verified identifiers and concerning she continues to do well. RN verified pt is using her DME when ambulating to avoid risk of falls. Pt states she has some swelling to her right ankle/knee area (hisotry) but will elevate her leg today to prevent further swelling. RN strongly encourage pt to elevate to avoid ongoing swelling. No other inquries or request at this time. RN review plan of care and goals along with the upcoming next home visit in December. Pt very grateful and expressed how appreciative she was for this RN initiative in assisting her with her ongoing care. Will follow up accordingly.   [name], RN Care Management Coordinator [place] Office [phone]

## 2017-04-04 ENCOUNTER — Other Ambulatory Visit: Payer: Self-pay | Admitting: *Deleted

## 2017-04-06 NOTE — Patient Outreach (Addendum)
[place] [facility]) Care Management   04/04/2017  Jacqueline [name] Sep 15, 1928 [id number]  Jacqueline [name] is an 83 y.o. female  Subjective:  SAFETY: Pt continues to be safe with no reported falls or injuries as pt has a safe home environment. Pt verifies she continues to use her rollator when ambulating throughout her home with ho problems.  APPOINTMENTS: Pt reports she is attending all her medical appointments with no delays or missed appointments. Pt verified she also has sufficient transportation to all her medical appointments. SWELLING: Pt states she has compression stockings but can not find them to apply. States this has been a history swelling to her lower legs. Pt does not elevate throughout the day to reduce ongoing swelling but indicates she would start this process. MEDICATION: Pt reports she is taking all her medications as prescribed and taking her Lasix as recommended once daily when needed for swelling. Pt's son was not aware that the pt was taking this medication as he is responsible for filling pt's pillbox and has not added this medication to her regimen. Son is requesting clarification on this medications.  Son also requested the appropriate application/instructions for using her Voltaren gel. SKIN: Pt states her skin is very dry but no skin tears at this time. DME: Pt states she uses her knee brace but not sure on if it's own correctly.  NUTRITION: Pt states she consumes Pepsi daily and sometimes more then one Pepsi a daily. No effects reported at this time. Pt very excited today during the home visit and expressed her appreciation for [facility] services and the RN involvement with her care.     Objective:   Review of Systems  Constitutional: Negative.   HENT: Negative.   Eyes: Negative.   Respiratory: Negative.   Cardiovascular: Positive for leg swelling.       Bilateral lower legs swelling  Gastrointestinal: Negative.   Genitourinary: Negative.   Musculoskeletal:  Negative.   Skin: Negative.        Dry skin  Neurological: Negative.   Endo/Heme/Allergies: Negative.   Psychiatric/Behavioral: Negative.     Physical Exam  Constitutional: She is oriented to person, place, and time. She appears well-developed and well-nourished.  HENT:  Right Ear: External ear normal.  Left Ear: External ear normal.  Neck: Normal range of motion.  Cardiovascular: Normal heart sounds.  Respiratory: Effort normal and breath sounds normal.  GI: Soft. Bowel sounds are normal.  Musculoskeletal: She exhibits edema.  Bilateral swelling to lower legs  Neurological: She is alert and oriented to person, place, and time.  Skin: Skin is warm and dry.  Very dry skin to lower extremities  Psychiatric: She has a normal mood and affect. Her behavior is normal. Judgment and thought content normal.    Encounter Medications:   Outpatient Encounter Medications as of 04/04/2017  Medication Sig  . ALPRAZolam (XANAX) 0.25 MG tablet Take 0.5 tablets (0.125 mg total) by mouth every 6 (six) hours. (Patient taking differently: Take 0.125 mg every 6 (six) hours by mouth. Pt takes as needed)  . ANUCORT-HC 25 MG suppository Place 25 mg rectally daily as needed.  Marland Kitchen apixaban (ELIQUIS) 5 MG TABS tablet Take 1 tablet (5 mg total) by mouth 2 (two) times daily.  Marland Kitchen atorvastatin (LIPITOR) 20 MG tablet Take 1 tablet (20 mg total) by mouth daily at 6 PM. (Patient not taking: Reported on 02/13/2017)  . Cholecalciferol (VITAMIN D3) 5000 UNITS CAPS Take 5,000 Units by mouth daily.  . cloNIDine (CATAPRES) 0.1  MG tablet Take 1 tablet (0.1 mg total) by mouth every 2 (two) hours as needed (take as needed for a  Blood pressure that is greater than 160 on top, or bottom number greater than 100.). (Patient not taking: Reported on 02/13/2017)  . diclofenac sodium (VOLTAREN) 1 % GEL Apply 2 g topically 4 (four) times daily. (Patient not taking: Reported on 03/07/2017)  . diltiazem (TIAZAC) 180 MG 24 hr capsule Take 1  mg by mouth 2 (two) times daily.  . furosemide (LASIX) 40 MG tablet Take 1 tablet (40 mg total) by mouth daily. (Patient not taking: Reported on 02/13/2017)  . HYDROcodone-acetaminophen (NORCO/VICODIN) 5-325 MG per tablet Take 1 tablet by mouth every 6 (six) hours as needed for moderate pain.  . metoprolol succinate (TOPROL-XL) 50 MG 24 hr tablet TAKE 1 TABLET (50 MG TOTAL) BY MOUTH 2 (TWO) TIMES DAILY. TAKE WITH OR IMMEDIATELY FOLLOWING A MEAL. (Patient taking differently: TAKE 1 TABLET (50 MG TOTAL) BY MOUTH 2 (TWO) TIMES DAILY. TAKE WITH OR IMMEDIATELY FOLLOWING A MEAL. Takes at 6 and 6)  . Multiple Vitamins-Minerals (CENTRUM SILVER PO) Take 1 tablet by mouth daily.  . pantoprazole (PROTONIX) 40 MG tablet TAKE 1 TABLET EVERY MORNING  . PEDIALYTE (PEDIALYTE) SOLN Take 240 mLs by mouth daily. APPROX 6 TO 8 OUNCES TWICE DAILY  . potassium chloride SA (K-DUR,KLOR-CON) 20 MEQ tablet TAKE 1 TABLET EVERY DAY (Patient not taking: Reported on 02/13/2017)  . senna (SENOKOT) 8.6 MG TABS tablet Take 1 tablet daily as needed by mouth for mild constipation.  . simethicone (MYLICON) 80 MG chewable tablet Chew 1 tablet (80 mg total) by mouth 4 (four) times daily as needed for flatulence. (Patient not taking: Reported on 02/13/2017)   No facility-administered encounter medications on file as of 04/04/2017.     Functional Status:   In your present state of health, do you have any difficulty performing the following activities: 03/01/2017  Hearing? N  Vision? N  Difficulty concentrating or making decisions? N  Walking or climbing stairs? Y  Dressing or bathing? N  Doing errands, shopping? Y  Some recent data might be hidden    Fall/Depression Screening:    Fall Risk  03/01/2017 02/23/2017  Falls in the past year? Yes Yes  Number falls in past yr: 1 1  Injury with Fall? Yes Yes  Risk Factor Category  High Fall Risk High Fall Risk  Risk for fall due to : Impaired balance/gait History of fall(s);Impaired  mobility  Follow up Falls prevention discussed Falls evaluation completed   PHQ 2/9 Scores 03/01/2017 02/23/2017  PHQ - 2 Score 0 -  Exception Documentation - Other- indicate reason in comment box  Not completed - Flake answered screening questions.   BP 128/72 (BP Location: Left Arm, Patient Position: Sitting, Cuff Size: Normal)   Pulse 67   Resp 20   SpO2 98%    Assessment:   Ongoing case management related to safety in the home and fall prevention Follow up on appointment adherence Bilateral swelling related to lower extremities Medication issues related Lasix and Voltaren gel Risk of skin tears related dry skin Use of DME for proper application of a soft compression knee brace Nutrition issues with unhealthy consumption of beverages   Plan:  Will verify no falls or related injuries as pt continues to be safe in the home (lives alone). Will verified adjustments have been made within the home as RN evaluated on last month. Will continue to encourage use of rollator  and safety measures within the home accordingly. Will verify pt's has attended all her scheduled medical appointments with no delays or reported issues and continue to encouraged adherence to her appointments. Will reiterated on compression stockings once again due to the ongoing swelling to the lower legs. Will provider community resources and provide measurements accordingly. Will also request son to verify with th ept's provider concerning the use of compression stockings to assist with her ongoing swelling. Will education on the purpose of the compression stockings and to remove the stockings at bedtime only wearing them during the day when she is mobile (pt verbalized an understanding). Will strongly encouraged pt to elevate her lower legs throughout the day to reduce ongoing swelling.  Will review medications and contact pt's provider to clarify the use of her Lasix (note prescription is out of date and written by a  provider pt no longer is involved with). RN left detail message with the triage nurse requesting clarification on the use of her Lasix. RN will request the office to contact the pt or her son [name]) with the specifics on this medications. Will follow up later this week to confirm. Will educate on the application of the Voltaren gel to her right leg discomfort and the amount to dose directly to the site (pea size portion based upon the instructions). Will educate lubrication to her skin due to risk of kin tears and apply Vaseline to pt's lower legs and foots. Will encouraged daily lubrication as a prevention measure due to pt's ongoing swelling to her lower extremities. Will educate pt on the use of her DME for her soft brace knee and allow pt to self apply for correct application when her sons are not available.  Will discuss healthy habits related to consumption of beverages. Will discuss a regimen to incorporate cranberry juice as a prevention measure to lower risk of UTI and encouraged hydration of H2O. Will generate calendar for day to reduce Pepsi and incorporate more healthy beverages on the [facility] calendar.  Plan care updated along with goals and RN will continue to provide the needed education and reiterate on pt's care plan for ongoing adherence. Will follow up with a call within the next week on her medication and scheduled a home visit for next month according to pt's scheduled. No other inquires or request at this time.  [name], RN Care Management Coordinator [place] Office [phone]

## 2017-05-09 ENCOUNTER — Other Ambulatory Visit: Payer: Self-pay | Admitting: *Deleted

## 2017-05-09 NOTE — Patient Outreach (Signed)
[place] [facility]) Care Management  05/09/2017  Jacqueline [name] [birth year] [id number]    RN received a call from pt requested a later visit. Pt states she "over di it" during the [place] and her knees were bothering her to the point of limited mobility. States no much swelling but it was difficult to stand with weight on it but states no falls since RN case manager's last home visit.  RN strongly encouraged pt to contact her orthopedic (Dr. Veverly Fells) and requested possible intervention. RN also verified if pt has sufficient transportation. Pt states her daughter is willing to take off and take her if the provider's office request to see her for an appointment. States she has been elevating her legs to reduce any ongoing swelling and has improved along with taking her Lasix as recommended. Pt still follows instructions by taking all her medications from her pillbox.  States most of what she needs is right at her bedside today.   RN inquired further by reviewing her plan of care and goals. Pt states she is doing well and will continue to wear her compression stocking to reduce any swelling. States she wears during the day and removes at bedtime with no falls and she continues to use her rolling walker. Pt states she is using Vaseline to her skin as RN applied and continued to encouraged pt to use daily to skin to prevent skin tears especially to her lower extremities from her swelling. Pt states this has worked very well. Plan of care updated and discussed with pt with pending call to her orthopedic provided along with interventions adjusted accordingly. Offered to change to home visit appointment to later in this month however pt requested next month. Will scheduled according to pt's request and completed a telephonic assessment today. Will also contact pt's son [name]) with an update and pt's request for an appointment next month as a reminder. No other issues or request at this time as pt will again  contact her provider concerning her knee issues today.  [name], RN Care Management Coordinator [place] Office [phone]

## 2017-05-11 ENCOUNTER — Ambulatory Visit: Payer: Self-pay | Admitting: *Deleted

## 2017-06-16 ENCOUNTER — Other Ambulatory Visit: Payer: Self-pay | Admitting: *Deleted

## 2017-06-16 NOTE — Patient Outreach (Signed)
[place] [facility]) Care Management   06/16/2017  Jacqueline [name] 07/20/1928 [id number]  Jacqueline [name] is an 84 y.o. female  Subjective:  FALLS: Pt and family reports no falls or issues with injuries during ambulation. States pt continues to use her rollator around the home independently.  Pt continues to live alone but very supportive family that takes her to her appointments and fills her pill box for medications adherence. Pt continues to complaint of her knee pain and will family will make an appointments for possible ongoing treatment for pain and inflammation shots.  SWELLING: Pt reports ongoing swelling however has not been able to wear her compression stockings due to the inability of placing the compression stocking on. Pt states no swelling until today a lot of swelling due to being up last night with the bleeding episode (hemorrhoids). Pt has suppositories that has controlled the bleeding due to her ongoing Eliquis medications. Pt states she takes her Lasix (PRN) when needed for her swelling and took this medication this morning.  APPOINTMENT: Pt states she has not made an appointment but her son [name] will contact her provider and make an appointment to address and treat her ongoing pain.   Objective:   Review of Systems  Constitutional: Negative.   HENT: Negative.   Eyes: Negative.   Respiratory: Negative.   Cardiovascular: Positive for leg swelling.       Bilateral edema to LE Left 3+ Right 2+.  Gastrointestinal: Negative.   Genitourinary: Negative.   Musculoskeletal: Negative.   Skin: Negative.   Neurological: Negative.   Endo/Heme/Allergies: Negative.   Psychiatric/Behavioral: Negative.     Physical Exam  Constitutional: She is oriented to person, place, and time. She appears well-developed and well-nourished.  HENT:  Right Ear: External ear normal.  Left Ear: External ear normal.  Eyes: EOM are normal.  Neck: Normal range of motion.  Cardiovascular: Normal heart  sounds.  Respiratory: Breath sounds normal.  GI: Soft. Bowel sounds are normal.  Musculoskeletal: She exhibits edema.  Bilateral edema to LE 3+left and 2+ right.  Neurological: She is alert and oriented to person, place, and time.  Skin: Skin is warm and dry.  Psychiatric: She has a normal mood and affect. Her behavior is normal. Judgment and thought content normal.    Encounter Medications:   Outpatient Encounter Medications as of 06/16/2017  Medication Sig  . ALPRAZolam (XANAX) 0.25 MG tablet Take 0.5 tablets (0.125 mg total) by mouth every 6 (six) hours. (Patient taking differently: Take 0.125 mg every 6 (six) hours by mouth. Pt takes as needed)  . ANUCORT-HC 25 MG suppository Place 25 mg rectally daily as needed.  Marland Kitchen apixaban (ELIQUIS) 5 MG TABS tablet Take 1 tablet (5 mg total) by mouth 2 (two) times daily.  . Cholecalciferol (VITAMIN D3) 5000 UNITS CAPS Take 5,000 Units by mouth daily.  . cloNIDine (CATAPRES) 0.1 MG tablet Take 1 tablet (0.1 mg total) by mouth every 2 (two) hours as needed (take as needed for a  Blood pressure that is greater than 160 on top, or bottom number greater than 100.).  Marland Kitchen diclofenac sodium (VOLTAREN) 1 % GEL Apply 2 g topically 4 (four) times daily.  Marland Kitchen diltiazem (TIAZAC) 180 MG 24 hr capsule Take 1 mg by mouth 2 (two) times daily.  . furosemide (LASIX) 40 MG tablet Take 1 tablet (40 mg total) by mouth daily. (Patient taking differently: Take 40 mg by mouth daily as needed. )  . HYDROcodone-acetaminophen (NORCO/VICODIN) 5-325 MG per  tablet Take 1 tablet by mouth every 6 (six) hours as needed for moderate pain.  . metoprolol succinate (TOPROL-XL) 50 MG 24 hr tablet TAKE 1 TABLET (50 MG TOTAL) BY MOUTH 2 (TWO) TIMES DAILY. TAKE WITH OR IMMEDIATELY FOLLOWING A MEAL. (Patient taking differently: TAKE 1 TABLET (50 MG TOTAL) BY MOUTH 2 (TWO) TIMES DAILY. TAKE WITH OR IMMEDIATELY FOLLOWING A MEAL. Takes at 6 and 6)  . Multiple Vitamins-Minerals (CENTRUM SILVER PO) Take  1 tablet by mouth daily.  . pantoprazole (PROTONIX) 40 MG tablet TAKE 1 TABLET EVERY MORNING  . senna (SENOKOT) 8.6 MG TABS tablet Take 1 tablet daily as needed by mouth for mild constipation.  Marland Kitchen atorvastatin (LIPITOR) 20 MG tablet Take 1 tablet (20 mg total) by mouth daily at 6 PM. (Patient not taking: Reported on 02/13/2017)  . PEDIALYTE (PEDIALYTE) SOLN Take 240 mLs by mouth daily. APPROX 6 TO 8 OUNCES TWICE DAILY  . potassium chloride SA (K-DUR,KLOR-CON) 20 MEQ tablet TAKE 1 TABLET EVERY DAY (Patient not taking: Reported on 06/16/2017)  . simethicone (MYLICON) 80 MG chewable tablet Chew 1 tablet (80 mg total) by mouth 4 (four) times daily as needed for flatulence. (Patient not taking: Reported on 02/13/2017)   No facility-administered encounter medications on file as of 06/16/2017.     Functional Status:   In your present state of health, do you have any difficulty performing the following activities: 03/01/2017  Hearing? N  Vision? N  Difficulty concentrating or making decisions? N  Walking or climbing stairs? Y  Dressing or bathing? N  Doing errands, shopping? Y  Some recent data might be hidden    Fall/Depression Screening:    Fall Risk  03/01/2017 02/23/2017  Falls in the past year? Yes Yes  Number falls in past yr: 1 1  Injury with Fall? Yes Yes  Risk Factor Category  High Fall Risk High Fall Risk  Risk for fall due to : Impaired balance/gait History of fall(s);Impaired mobility  Follow up Falls prevention discussed Falls evaluation completed   PHQ 2/9 Scores 03/01/2017 02/23/2017  PHQ - 2 Score 0 -  Exception Documentation - Other- indicate reason in comment box  Not completed - Flake answered screening questions.   BP 130/72 (BP Location: Left Arm, Patient Position: Sitting, Cuff Size: Normal)   Pulse 93   Resp 20   SpO2 92%  Assessment:   Follow up on falls related to history Bilateral edema related to LE Bleeding related to Hemorrhoids Appointment related to  Knee pain  Plan:  Will verify pt has not had any falls and continue to use safety measures during her ambulation while home alone. Will reiterated on safety measures and mark this goal as met over the last 90 days. Will continue to encouraged pt on safety measures and prevention in using her rollator during ambulation. Pt continues to wear her ADT necklace for emergencies with no problems.  Will strongly encouraged pt to elevate her LE if she is not able to apply the compression stockings during the day (pt not strong enough to apply to TEDs). Will continue to encouraged her PRN Lasix when need for her ongoing swelling.   Will verify no additional bleeding now controlled as pt check during this RN case manager home visit. Will continue to encouraged use of prescription supporosities as needed. Will stress the importance of making an appointment to assist with her ongoing knee pain and discomfort with her orthopedic provider. Will discuss her pain medications and when to  administer this medications.  Plan of care goals and interventions discussed and adjusted accordingly. Will review and re-elevate next month to verify pt continue to manage her care independently with assistance from family.   [name], RN Care Management Coordinator [place] Office [phone]

## 2017-07-07 ENCOUNTER — Encounter: Payer: Self-pay | Admitting: *Deleted

## 2017-07-28 ENCOUNTER — Other Ambulatory Visit: Payer: Self-pay | Admitting: *Deleted

## 2017-07-28 ENCOUNTER — Encounter: Payer: Self-pay | Admitting: *Deleted

## 2017-07-28 NOTE — Patient Outreach (Signed)
[place] [facility]) Care Management  07/28/2017  Brigetta Beckstrom [birth year] [id number]    Case Closure  RN received an incoming call from pt's son [name]) who indicated pt was home today upon RN home visit however in the bathroom. Son has indicated pt feels at this time she is doing well enough to cease visits with no additional needs or issues. Caregiver very appreciative and feels this RN was able to help the pt improve her overall help during the beginning of the process. All goals discussed with confirmation. RN strongly encouraged caregiver to inform pt to elevate her LL to prevent ongoing swelling and use assisted devices at all times to prevent reisk of falls/injuries.  Inquired on any needed resources at this time (none needed). Also verified if pt has any needs in the further to reach out the [facility] for possible assistance. Again caregiver very grateful and aware that pt's primary provider will be informed of pt's disposition. Case will be closed.  [name], RN Care Management Coordinator [place] Office [phone]

## 2017-07-28 NOTE — Patient Outreach (Signed)
[place] [facility]) Care Management  07/28/2017  Jacqueline [name] 1929-04-19 [id number]   RN attempted a home visit today however pt not available. RN contacted both the pt and the son [name]) but only able to leave HIPAA approved voice messages. Requested a call back to rescheduled and will inquire on pt's ongoing management of care at that time. Will also leave card on door for contact information.  [name], RN Care Management Coordinator [place] Office [phone]

## 2017-10-24 ENCOUNTER — Other Ambulatory Visit: Payer: Self-pay

## 2017-10-24 ENCOUNTER — Inpatient Hospital Stay (HOSPITAL_COMMUNITY)
Admission: EM | Admit: 2017-10-24 | Discharge: 2017-10-28 | DRG: 065 | Disposition: A | Discharge status: Skilled Nursing Facility | Payer: Medicare Other | Attending: Internal Medicine | Admitting: Internal Medicine

## 2017-10-24 ENCOUNTER — Emergency Department (HOSPITAL_COMMUNITY): Payer: Medicare Other

## 2017-10-24 ENCOUNTER — Observation Stay (HOSPITAL_COMMUNITY): Payer: Medicare Other

## 2017-10-24 ENCOUNTER — Encounter (HOSPITAL_COMMUNITY): Payer: Self-pay | Admitting: Emergency Medicine

## 2017-10-24 DIAGNOSIS — I5032 Chronic diastolic (congestive) heart failure: Secondary | ICD-10-CM | POA: Diagnosis not present

## 2017-10-24 DIAGNOSIS — E785 Hyperlipidemia, unspecified: Secondary | ICD-10-CM | POA: Diagnosis present

## 2017-10-24 DIAGNOSIS — G459 Transient cerebral ischemic attack, unspecified: Secondary | ICD-10-CM | POA: Diagnosis not present

## 2017-10-24 DIAGNOSIS — I10 Essential (primary) hypertension: Secondary | ICD-10-CM

## 2017-10-24 DIAGNOSIS — I480 Paroxysmal atrial fibrillation: Secondary | ICD-10-CM | POA: Diagnosis present

## 2017-10-24 DIAGNOSIS — I639 Cerebral infarction, unspecified: Secondary | ICD-10-CM | POA: Diagnosis not present

## 2017-10-24 DIAGNOSIS — R29700 NIHSS score 0: Secondary | ICD-10-CM | POA: Diagnosis present

## 2017-10-24 DIAGNOSIS — I63311 Cerebral infarction due to thrombosis of right middle cerebral artery: Secondary | ICD-10-CM | POA: Diagnosis not present

## 2017-10-24 DIAGNOSIS — Z880 Allergy status to penicillin: Secondary | ICD-10-CM

## 2017-10-24 DIAGNOSIS — Z8673 Personal history of transient ischemic attack (TIA), and cerebral infarction without residual deficits: Secondary | ICD-10-CM

## 2017-10-24 DIAGNOSIS — G8194 Hemiplegia, unspecified affecting left nondominant side: Secondary | ICD-10-CM | POA: Diagnosis not present

## 2017-10-24 DIAGNOSIS — M1710 Unilateral primary osteoarthritis, unspecified knee: Secondary | ICD-10-CM | POA: Diagnosis present

## 2017-10-24 DIAGNOSIS — F0390 Unspecified dementia without behavioral disturbance: Secondary | ICD-10-CM | POA: Diagnosis present

## 2017-10-24 DIAGNOSIS — I110 Hypertensive heart disease with heart failure: Secondary | ICD-10-CM | POA: Diagnosis present

## 2017-10-24 DIAGNOSIS — R4182 Altered mental status, unspecified: Secondary | ICD-10-CM | POA: Diagnosis present

## 2017-10-24 DIAGNOSIS — I63411 Cerebral infarction due to embolism of right middle cerebral artery: Principal | ICD-10-CM | POA: Diagnosis present

## 2017-10-24 DIAGNOSIS — R4781 Slurred speech: Secondary | ICD-10-CM | POA: Diagnosis not present

## 2017-10-24 DIAGNOSIS — Z87891 Personal history of nicotine dependence: Secondary | ICD-10-CM

## 2017-10-24 DIAGNOSIS — R4701 Aphasia: Secondary | ICD-10-CM | POA: Diagnosis present

## 2017-10-24 DIAGNOSIS — R471 Dysarthria and anarthria: Secondary | ICD-10-CM | POA: Diagnosis present

## 2017-10-24 DIAGNOSIS — Z7982 Long term (current) use of aspirin: Secondary | ICD-10-CM

## 2017-10-24 DIAGNOSIS — K219 Gastro-esophageal reflux disease without esophagitis: Secondary | ICD-10-CM | POA: Diagnosis present

## 2017-10-24 DIAGNOSIS — K589 Irritable bowel syndrome without diarrhea: Secondary | ICD-10-CM | POA: Diagnosis present

## 2017-10-24 DIAGNOSIS — Z7901 Long term (current) use of anticoagulants: Secondary | ICD-10-CM

## 2017-10-24 DIAGNOSIS — Z8711 Personal history of peptic ulcer disease: Secondary | ICD-10-CM

## 2017-10-24 DIAGNOSIS — R410 Disorientation, unspecified: Secondary | ICD-10-CM | POA: Diagnosis not present

## 2017-10-24 DIAGNOSIS — F411 Generalized anxiety disorder: Secondary | ICD-10-CM | POA: Diagnosis present

## 2017-10-24 DIAGNOSIS — R0902 Hypoxemia: Secondary | ICD-10-CM | POA: Diagnosis not present

## 2017-10-24 DIAGNOSIS — Z66 Do not resuscitate: Secondary | ICD-10-CM | POA: Diagnosis not present

## 2017-10-24 DIAGNOSIS — I272 Pulmonary hypertension, unspecified: Secondary | ICD-10-CM | POA: Diagnosis present

## 2017-10-24 DIAGNOSIS — Z888 Allergy status to other drugs, medicaments and biological substances status: Secondary | ICD-10-CM

## 2017-10-24 DIAGNOSIS — Z886 Allergy status to analgesic agent status: Secondary | ICD-10-CM

## 2017-10-24 DIAGNOSIS — I739 Peripheral vascular disease, unspecified: Secondary | ICD-10-CM | POA: Diagnosis present

## 2017-10-24 DIAGNOSIS — Z833 Family history of diabetes mellitus: Secondary | ICD-10-CM

## 2017-10-24 DIAGNOSIS — Z823 Family history of stroke: Secondary | ICD-10-CM

## 2017-10-24 LAB — CBC WITH DIFFERENTIAL/PLATELET
Abs Immature Granulocytes: 0 10*3/uL (ref 0.0–0.1)
Basophils Absolute: 0 10*3/uL (ref 0.0–0.1)
Basophils Relative: 1 %
Eosinophils Absolute: 0 10*3/uL (ref 0.0–0.7)
Eosinophils Relative: 1 %
HCT: 45.3 % (ref 36.0–46.0)
Hemoglobin: 14.9 g/dL (ref 12.0–15.0)
Immature Granulocytes: 0 %
Lymphocytes Relative: 17 %
Lymphs Abs: 1.1 10*3/uL (ref 0.7–4.0)
MCH: 32.2 pg (ref 26.0–34.0)
MCHC: 32.9 g/dL (ref 30.0–36.0)
MCV: 97.8 fL (ref 78.0–100.0)
Monocytes Absolute: 0.5 10*3/uL (ref 0.1–1.0)
Monocytes Relative: 8 %
Neutro Abs: 4.6 10*3/uL (ref 1.7–7.7)
Neutrophils Relative %: 73 %
Platelets: 206 10*3/uL (ref 150–400)
RBC: 4.63 MIL/uL (ref 3.87–5.11)
RDW: 13.9 % (ref 11.5–15.5)
WBC: 6.3 10*3/uL (ref 4.0–10.5)

## 2017-10-24 LAB — URINALYSIS, ROUTINE W REFLEX MICROSCOPIC
Bilirubin Urine: NEGATIVE
Glucose, UA: NEGATIVE mg/dL
Hgb urine dipstick: NEGATIVE
Ketones, ur: NEGATIVE mg/dL
Leukocytes, UA: NEGATIVE
Nitrite: NEGATIVE
Protein, ur: NEGATIVE mg/dL
Specific Gravity, Urine: 1.005 (ref 1.005–1.030)
pH: 7 (ref 5.0–8.0)

## 2017-10-24 LAB — BASIC METABOLIC PANEL WITH GFR
Anion gap: 9 (ref 5–15)
BUN: 11 mg/dL (ref 8–23)
CO2: 31 mmol/L (ref 22–32)
Calcium: 10 mg/dL (ref 8.9–10.3)
Chloride: 104 mmol/L (ref 98–111)
Creatinine, Ser: 1 mg/dL (ref 0.44–1.00)
GFR calc Af Amer: 57 mL/min — ABNORMAL LOW
GFR calc non Af Amer: 49 mL/min — ABNORMAL LOW
Glucose, Bld: 102 mg/dL — ABNORMAL HIGH (ref 70–99)
Potassium: 3.7 mmol/L (ref 3.5–5.1)
Sodium: 144 mmol/L (ref 135–145)

## 2017-10-24 LAB — TSH: TSH: 1.539 u[IU]/mL (ref 0.350–4.500)

## 2017-10-24 MED ORDER — APIXABAN 5 MG PO TABS
5.0000 mg | ORAL_TABLET | Freq: Two times a day (BID) | ORAL | Status: DC
  Administered 2017-10-24: 5 mg via ORAL
  Filled 2017-10-24 (×2): qty 1

## 2017-10-24 MED ORDER — PANTOPRAZOLE SODIUM 40 MG PO TBEC
40.0000 mg | DELAYED_RELEASE_TABLET | Freq: Every day | ORAL | Status: DC
  Administered 2017-10-25 – 2017-10-28 (×4): 40 mg via ORAL
  Filled 2017-10-24 (×4): qty 1

## 2017-10-24 MED ORDER — ALPRAZOLAM 0.25 MG PO TABS: 0.2500 mg | ORAL_TABLET | Freq: Once | ORAL | Status: DC

## 2017-10-24 MED ORDER — ASPIRIN 300 MG RE SUPP: 300.0000 mg | Freq: Every day | RECTAL | Status: DC

## 2017-10-24 MED ORDER — ACETAMINOPHEN 325 MG PO TABS
650.0000 mg | ORAL_TABLET | ORAL | Status: DC | PRN
  Administered 2017-10-26 – 2017-10-28 (×3): 650 mg via ORAL
  Filled 2017-10-24 (×3): qty 2

## 2017-10-24 MED ORDER — POLYETHYLENE GLYCOL 3350 17 G PO PACK
17.0000 g | PACK | Freq: Every day | ORAL | Status: DC
  Administered 2017-10-24 – 2017-10-28 (×4): 17 g via ORAL
  Filled 2017-10-24 (×5): qty 1

## 2017-10-24 MED ORDER — SODIUM CHLORIDE 0.9 % IV SOLN
INTRAVENOUS | Status: DC
  Administered 2017-10-24: 22:00:00 via INTRAVENOUS

## 2017-10-24 MED ORDER — ACETAMINOPHEN 650 MG RE SUPP: 650.0000 mg | RECTAL | Status: DC | PRN

## 2017-10-24 MED ORDER — ALPRAZOLAM 0.25 MG PO TABS
0.1250 mg | ORAL_TABLET | Freq: Four times a day (QID) | ORAL | Status: DC | PRN
  Administered 2017-10-24 – 2017-10-28 (×11): 0.125 mg via ORAL
  Filled 2017-10-24 (×11): qty 1

## 2017-10-24 MED ORDER — SENNOSIDES-DOCUSATE SODIUM 8.6-50 MG PO TABS
1.0000 | ORAL_TABLET | Freq: Every evening | ORAL | Status: DC | PRN
  Administered 2017-10-26 – 2017-10-27 (×2): 1 via ORAL
  Filled 2017-10-24 (×2): qty 1

## 2017-10-24 MED ORDER — STROKE: EARLY STAGES OF RECOVERY BOOK
Freq: Once | Status: AC
  Administered 2017-10-27: 18:00:00
  Filled 2017-10-24: qty 1

## 2017-10-24 MED ORDER — ACETAMINOPHEN 160 MG/5ML PO SOLN: 650.0000 mg | ORAL | Status: DC | PRN

## 2017-10-24 MED ORDER — ALPRAZOLAM 0.25 MG PO TABS
0.2500 mg | ORAL_TABLET | Freq: Once | ORAL | Status: AC
  Administered 2017-10-24: 0.25 mg via ORAL
  Filled 2017-10-24: qty 1

## 2017-10-24 MED ORDER — ASPIRIN 325 MG PO TABS
325.0000 mg | ORAL_TABLET | Freq: Every day | ORAL | Status: DC
  Filled 2017-10-24 (×2): qty 1

## 2017-10-24 MED ORDER — HYDROCODONE-ACETAMINOPHEN 5-325 MG PO TABS
1.0000 | ORAL_TABLET | Freq: Four times a day (QID) | ORAL | Status: DC | PRN
Start: 2017-10-24 — End: 2017-10-28
  Administered 2017-10-24 – 2017-10-28 (×13): 1 via ORAL
  Filled 2017-10-24 (×13): qty 1

## 2017-10-24 NOTE — ED Notes (Signed)
Pt put on purewick at 1400

## 2017-10-24 NOTE — H&P (Signed)
History and Physical    Jacqueline [name] YPP:[id number] DOB: 1928/10/16 DOA: 10/24/2017  PCP: Lajean Manes, MD Consultants:  None Patient coming from:  Home - lives with Son ([place] NOK: Son, [phone]  Chief Complaint:  Speech issues  HPI: Jacqueline [name] is a 84 y.o. female with medical history significant of HTN; TIA; GI bleeding; and afib on Eliquis presenting with speech problems. "Because I was tired of hiding the way that I felt.  I thought that it might lead to another mini-stroke and I've had several of those.  And I was scared, upset, and nervous.  I couldn't get together my thinking right and couldn't remember what to do next, what am I supposed to do to put this article of clothing on.  Things like that you know.  Make-up, should I, does this go first or... Let me check it out first.  It's just confusing."  Her son notices that she seems very weak and her speech is "slurry, not as sharp".  She has been constipated but had a BM last night - "but I need it regulated".  Her son thinks she is thin, maybe losing weight.  She reports not eating as much as she used to.  Symptoms started a year or so ago. She does not drive.  They have never been told she has dementia.   ED Course:  Concerned for possible CVA - hesitant speech, word finding difficulty despite Eliquis. Head CT age-indeterminate infarct.  Awaiting call back from neurology.  Review of Systems: As per HPI; otherwise review of systems reviewed and negative.   Ambulatory Status:  Ambulates with a walker  Past Medical History:  Diagnosis Date  . Allergy, unspecified not elsewhere classified   . Anemia, unspecified   . Anxiety state, unspecified   . Atrial fibrillation with rapid ventricular response ([place]) 03/13/12  . Blood in stool   . Complication of anesthesia    "hard to wake me up after colonoscopy" (03/13/12)  . Degeneration of lumbar or lumbosacral intervertebral disc   .  GERD (gastroesophageal reflux disease)   . H/O hiatal hernia   . Hemorrhage of gastrointestinal tract, unspecified   . History of IBS   . Osteoarthrosis, unspecified whether generalized or localized, lower leg   . Other diseases of lung, not elsewhere classified   . Other dyspnea and respiratory abnormality   . Personal history of other diseases of digestive system   . Personal history of peptic ulcer disease   . Unspecified adverse effect of other drug, medicinal and biological substance(995.29)   . Unspecified cerebral artery occlusion with cerebral infarction 10/2008   "mini stroke" denies residual (03/13/12)  . Unspecified essential hypertension   . Unspecified paranoid state     Past Surgical History:  Procedure Laterality Date  . DILATION AND CURETTAGE OF UTERUS  1950's?  . ORIF HUMERUS FRACTURE Right 04/03/2014   Procedure: OPEN REDUCTION INTERNAL FIXATION (ORIF) RIGHT HUMERUS DISTAL FRACTURE;  Surgeon: Roseanne Kaufman, MD;  Location: [facility];  Service: Orthopedics;  Laterality: Right;  . PILONIDAL CYST / SINUS EXCISION  1957  . SHOULDER ARTHROSCOPY  ~ 2000   right    Social History   Socioeconomic History  . Marital status: Widowed    Spouse name: Not on file  . Number of children: 4  . Years of education: 81  . Highest education level: Not on file  Occupational History  . Occupation: retired  Employer: RETIRED  Social Needs  . Financial resource strain: Not on file  . Food insecurity:    Worry: Not on file    Inability: Not on file  . Transportation needs:    Medical: Not on file    Non-medical: Not on file  Tobacco Use  . Smoking status: Former Smoker    Packs/day: 1.00    Years: 40.00    Pack years: 40.00    Types: Cigarettes    Last attempt to quit: 05/02/2002    Years since quitting: 15.4  . Smokeless tobacco: Never Used  Substance and Sexual Activity  . Alcohol use: No  . Drug use: No  . Sexual activity: Never  Lifestyle  . Physical activity:     Days per week: Not on file    Minutes per session: Not on file  . Stress: Not on file  Relationships  . Social connections:    Talks on phone: Not on file    Gets together: Not on file    Attends religious service: Not on file    Active member of club or organization: Not on file    Attends meetings of clubs or organizations: Not on file    Relationship status: Not on file  . Intimate partner violence:    Fear of current or ex partner: Not on file    Emotionally abused: Not on file    Physically abused: Not on file    Forced sexual activity: Not on file  Other Topics Concern  . Not on file  Social History Narrative   HSG. Married: 37- 8 yrs/ divorced; married 1960- 1988/ widowed. 4 grandchildren. 2 daughters- 1950, 1962; 2 sons- 1964, 1965. Lives alone- independent ADLs- pet cockatiel.    Allergies  Allergen Reactions  . Metoclopramide Hcl Other (See Comments)     Tremors  . Penicillins Itching    Has patient had a PCN reaction causing immediate rash, facial/tongue/throat swelling, SOB or lightheadedness with hypotension: Yes Has patient had a PCN reaction causing severe rash involving mucus membranes or skin necrosis: Yes Has patient had a PCN reaction that required hospitalization:No Has patient had a PCN reaction occurring within the last 10 years: No If all of the above answers are "NO", then may proceed with Cephalosporin use.   . Aspirin Other (See Comments)    "gave me stomach aches; made me bleed" (03/13/12)  . Zetia [Ezetimibe] Other (See Comments)    Stomach and back pain    Family History  Problem Relation Age of Onset  . Diabetes Mother   . CVA Mother   . CVA Father   . CVA Maternal Grandmother     Prior to Admission medications   Medication Sig Start Date End Date Taking? Authorizing Provider  ALPRAZolam (XANAX) 0.25 MG tablet Take 0.5 tablets (0.125 mg total) by mouth every 6 (six) hours. Patient taking differently: Take 0.125 mg every 6 (six) hours  by mouth. Pt takes as needed 06/20/14  Yes Regalado, Belkys A, MD  apixaban (ELIQUIS) 5 MG TABS tablet Take 1 tablet (5 mg total) by mouth 2 (two) times daily. 06/20/14  Yes Regalado, Belkys A, MD  Cholecalciferol (VITAMIN D3) 5000 UNITS CAPS Take 5,000 Units by mouth daily.   Yes [provider]  diltiazem (TIAZAC) 180 MG 24 hr capsule Take 180 mg by mouth 2 (two) times daily.  01/11/17  Yes [provider]  HYDROcodone-acetaminophen (NORCO/VICODIN) 5-325 MG per tablet Take 1 tablet by mouth every 6 (  six) hours as needed for moderate pain.   Yes [provider]  metoprolol succinate (TOPROL-XL) 50 MG 24 hr tablet TAKE 1 TABLET (50 MG TOTAL) BY MOUTH 2 (TWO) TIMES DAILY. TAKE WITH OR IMMEDIATELY FOLLOWING A MEAL. Patient taking differently: Take 50 mg by mouth 2 (two) times daily. Marland Kitchen 03/25/14  Yes Larey Dresser, MD  Multiple Vitamins-Minerals (CENTRUM SILVER PO) Take 1 tablet by mouth daily.   Yes [provider]  pantoprazole (PROTONIX) 40 MG tablet TAKE 1 TABLET EVERY MORNING Patient taking differently: Take 40 mg by mouth daily. TAKE 1 TABLET EVERY MORNING   Yes Hendricks Limes, MD  atorvastatin (LIPITOR) 20 MG tablet Take 1 tablet (20 mg total) by mouth daily at 6 PM. Patient not taking: Reported on 02/13/2017 06/20/14   Regalado, Jerald Kief A, MD  cloNIDine (CATAPRES) 0.1 MG tablet Take 1 tablet (0.1 mg total) by mouth every 2 (two) hours as needed (take as needed for a  Blood pressure that is greater than 160 on top, or bottom number greater than 100.). Patient not taking: Reported on 10/24/2017 12/04/12   Neena Rhymes, MD  diclofenac sodium (VOLTAREN) 1 % GEL Apply 2 g topically 4 (four) times daily. Patient not taking: Reported on 10/24/2017 02/13/17   Avie Echevaria B, PA-C  furosemide (LASIX) 40 MG tablet Take 1 tablet (40 mg total) by mouth daily. Patient taking differently: Take 40 mg by mouth daily as needed.  01/31/14   Larey Dresser, MD    potassium chloride SA (K-DUR,KLOR-CON) 20 MEQ tablet TAKE 1 TABLET EVERY DAY Patient not taking: Reported on 06/16/2017 04/18/13   Neena Rhymes, MD  senna (SENOKOT) 8.6 MG TABS tablet Take 1 tablet daily as needed by mouth for mild constipation.    [provider]  simethicone (MYLICON) 80 MG chewable tablet Chew 1 tablet (80 mg total) by mouth 4 (four) times daily as needed for flatulence. Patient not taking: Reported on 02/13/2017 06/20/14   Elmarie Shiley, MD    Physical Exam: Vitals:   10/24/17 1545 10/24/17 1600 10/24/17 1615 10/24/17 1630  BP: (!) 150/74 (!) 139/93 (!) 177/80 (!) 173/70  Pulse: (!) 58 64 63 65  Resp:  13 13 14   Temp:      TempSrc:      SpO2: 98% 96% 100% 96%  Weight:      Height:         General:  Appears calm and comfortable and is NAD; she is well kept and very pleasant Eyes:  PERRL, EOMI, normal lids, iris ENT:  grossly normal hearing, lips & tongue, mmm Neck:  no LAD, masses or thyromegaly; no carotid bruits Cardiovascular:  RRR, no m/r/g. No LE edema.  Respiratory:   CTA bilaterally with no wheezes/rales/rhonchi.  Normal respiratory effort. Abdomen:  soft, NT, ND, NABS Skin:  no rash or induration seen on limited exam Musculoskeletal:  grossly normal tone BUE/BLE, good ROM, no bony abnormality Lower extremity:  No LE edema.  Limited foot exam with no ulcerations.  2+ distal pulses. Psychiatric:  grossly normal mood and affect, speech fluent and appropriate, AOx3 Neurologic:  CN 2-12 grossly intact, moves all extremities in coordinated fashion, sensation intact    Radiological Exams on Admission: Ct Head Wo Contrast  Result Date: 10/24/2017 CLINICAL DATA:  84 year old female with acute confusion and aphasia. EXAM: CT HEAD WITHOUT CONTRAST TECHNIQUE: Contiguous axial images were obtained from the base of the skull through the vertex without intravenous contrast.  COMPARISON:  02/13/2017 head CT and prior studies FINDINGS: Brain: An age  indeterminate RIGHT thalamic infarct vs chronic ischemic changes now noted. No hemorrhage, midline shift, hydrocephalus, mass lesion or mass effect. Atrophy, chronic small-vessel white matter ischemic changes and remote cerebellar infarcts again noted. Vascular: Intracranial atherosclerotic calcifications again noted. Skull: Normal. Negative for fracture or focal lesion. Sinuses/Orbits: A small mucous retention cyst/polyp within the LEFT sphenoid sinus noted. Other: None IMPRESSION: Age indeterminate RIGHT thalamic infarct vs chronic ischemic changes. Atrophy, chronic small-vessel white matter ischemic changes and remote cerebellar infarcts. Electronically Signed   By: Margarette Canada M.D.   On: 10/24/2017 15:50    EKG: Independently reviewed.  NSR with rate 71; nonspecific ST changes with no evidence of acute ischemia   Labs on Admission: I have personally reviewed the available labs and imaging studies at the time of the admission.  Pertinent labs:   Glucose 102 GFR 49 Normal CBC Normal UA  Assessment/Plan Principal Problem:   TIA (transient ischemic attack) Active Problems:   PAF (paroxysmal atrial fibrillation) (HCC)   Chronic diastolic CHF (congestive heart failure) (HCC)   HLD (hyperlipidemia)   Possible TIA -I suspect underlying dementia although she has a h/o CVA and so recurrent TIA/CVA is certainly a consideration -Neurology consult is pending -Will place in observation status for CVA/TIA evaluation -Telemetry monitoring -MRI/MRA -Carotid dopplers and echo on hold, since progressive dementia in equally likely as the cause; if negative MRI, would not recommend those tests at this time -Risk stratification with FLP, A1c; will also check TSH  -ASA daily -PT/OT/ST/Nutrition Consults  HTN -Allow permissive HTN -Treat BP only if >220/120, and then with goal of 15% reduction -Hold Diltiazem and Toprol XL and plan to restart in 48-72 hours  HLD -Check FLP -Will plan to start  statin therapy if FLP is not at goal -Prior lipids in 2016: 169/71/62/93  PAF -She is rate controlled on Toprol and Cardizem - monitor HR off medications and resume as soon as this is appropriate -Continue Eliquis   Diastolic CHF -Appears compensated at this time -Last echo was in 2/16 and showed preserved EF with grade 1 diastolic dysfunction   DVT prophylaxis: Lovenox  Code Status:  DNR - confirmed with patient/family Family Communication: Son present throughout evaluation  Disposition Plan:  Home once clinically improved Consults called: Neurology; PT/OT/ST/Nutrition  Admission status: It is my clinical opinion that referral for OBSERVATION is reasonable and necessary in this patient based on the above information provided. The aforementioned taken together are felt to place the patient at high risk for further clinical deterioration. However it is anticipated that the patient may be medically stable for discharge from the hospital within 24 to 48 hours.    Karmen Bongo MD [facility]  If note is complete, please contact covering daytime or nighttime physician. [email or web address] Password [facility]  10/24/2017, 5:06 PM

## 2017-10-24 NOTE — Progress Notes (Signed)
Pt arrived to unit. AOx4. VSS

## 2017-10-24 NOTE — ED Triage Notes (Signed)
Pt arrives via EMS from home with complaints of trouble finding words and states she just feels confused. Reports it has been going on for years with multiple TIAs but worsened this week. Taking eliquis for hx of TIAs.

## 2017-10-24 NOTE — Consult Note (Signed)
NEURO HOSPITALIST      Requesting Physician: Dr. Lorin Mercy    Chief Complaint: word finding difficulty  History obtained from:  Patient / Chart    HPI:                                                                                                                                         Jacqueline [name] is an 84 y.o. female with [place] CVA 2014 ( medial cerebellar) and TIA 06/08/2014, HTN, afib with rvr on eliquis present to [facility] for word finding difficulty.   Patient appears confused and not a reliable historian.  Patient reports that today she woke up and then took a nap about noon. When she woke up at noon she was having a lot of difficulty finding her words.  She pressed the life alert button because she was worried that she was having another TIA.  Patient also states that she feels that she has not been thinking straight for awhile. She endorses that she lives alone, but her son pays the bills and separates her medication for her. She denies missing any doses of medications. Patient denies any weakness, numbness, tingling, facial droop, slurred speech, HA, or vision problems. Son reports that she had some slurred speech earlier today, but that it is not currently slurred.   CT head shows a right thalamic infart vs chronic ischemic changes. Chronic small vessel white matter ischemic changes and old cerebellar infarct.   Date last known well: unable to determine  Time last known well: unable to determine  tPA Given: no: contraindicated    Modified Rankin: Rankin Score=2  NIHSS: 0  1a Level of Conscious: 0 1b LOC Questions: 0 1c LOC Commands: 0 2 Best Gaze:0 3 Visual: 0 4 Facial Palsy: 0 5a Motor Arm - left: 0 5b Motor Arm - Right:0  6a Motor Leg - Left: 0 6b Motor Leg - Right: 0 7 Limb Ataxia:0  8 Sensory: 0 9 Best Language: 0 10 Dysarthria:0 11 Extinct. and Inattention:0 TOTAL: 0    Past Medical History:  Diagnosis Date  . Allergy,  unspecified not elsewhere classified   . Anemia, unspecified   . Anxiety state, unspecified   . Atrial fibrillation with rapid ventricular response ([place]) 03/13/12  . Blood in stool   . Complication of anesthesia    "hard to wake me up after colonoscopy" (03/13/12)  . Degeneration of lumbar or lumbosacral intervertebral disc   . GERD (gastroesophageal reflux disease)   . H/O hiatal hernia   . Hemorrhage of gastrointestinal tract, unspecified   . History of IBS   . Osteoarthrosis, unspecified whether generalized  or localized, lower leg   . Other diseases of lung, not elsewhere classified   . Other dyspnea and respiratory abnormality   . Personal history of other diseases of digestive system   . Personal history of peptic ulcer disease   . Unspecified adverse effect of other drug, medicinal and biological substance(995.29)   . Unspecified cerebral artery occlusion with cerebral infarction 10/2008   "mini stroke" denies residual (03/13/12)  . Unspecified essential hypertension   . Unspecified paranoid state     Past Surgical History:  Procedure Laterality Date  . DILATION AND CURETTAGE OF UTERUS  1950's?  . ORIF HUMERUS FRACTURE Right 04/03/2014   Procedure: OPEN REDUCTION INTERNAL FIXATION (ORIF) RIGHT HUMERUS DISTAL FRACTURE;  Surgeon: Roseanne Kaufman, MD;  Location: [place];  Service: Orthopedics;  Laterality: Right;  . PILONIDAL CYST / SINUS EXCISION  1957  . SHOULDER ARTHROSCOPY  ~ 2000   right    Family History  Problem Relation Age of Onset  . Diabetes Mother   . CVA Mother   . CVA Father   . CVA Maternal Grandmother        Social History:  reports that she quit smoking about 15 years ago. Her smoking use included cigarettes. She has a 40.00 pack-year smoking history. She has never used smokeless tobacco. She reports that she does not drink alcohol or use drugs.  Allergies:  Allergies  Allergen Reactions  . Metoclopramide Hcl Other (See Comments)     Tremors  .  Penicillins Itching    Has patient had a PCN reaction causing immediate rash, facial/tongue/throat swelling, SOB or lightheadedness with hypotension: Yes Has patient had a PCN reaction causing severe rash involving mucus membranes or skin necrosis: Yes Has patient had a PCN reaction that required hospitalization:No Has patient had a PCN reaction occurring within the last 10 years: No If all of the above answers are "NO", then may proceed with Cephalosporin use.   . Aspirin Other (See Comments)    "gave me stomach aches; made me bleed" (03/13/12)  . Zetia [Ezetimibe] Other (See Comments)    Stomach and back pain    Medications:                                                                                                                           Scheduled: .  stroke: mapping our early stages of recovery book   Does not apply Once  . apixaban  5 mg Oral BID  . aspirin  300 mg Rectal Daily   Or  . aspirin  325 mg Oral Daily  . [START ON 10/25/2017] pantoprazole  40 mg Oral Daily  . polyethylene glycol  17 g Oral Daily   Continuous: . sodium chloride     GTX:[id number] **OR** acetaminophen (TYLENOL) oral liquid 160 mg/5 mL **OR** acetaminophen, ALPRAZolam, HYDROcodone-acetaminophen, senna-docusate   ROS:  History obtained from the patient ( poor historian)  General ROS: negative for - chills, fatigue, fever, night sweats, weight gain or weight loss Psychological ROS: negative for - , anxiety Ophthalmic ROS: negative for - blurry vision, double vision, eye pain or loss of vision Respiratory ROS: negative for - cough,  shortness of breath or wheezing Cardiovascular ROS: negative for - chest pain, dyspnea on exertion,  Musculoskeletal ROS: negative for - joint swelling or muscular weakness Neurological ROS: as noted in HPI   General Examination:                                                                                                       Blood pressure (!) 161/75, pulse 62, temperature 98.4 F (36.9 C), temperature source Oral, resp. rate 16, height 5' (1.524 m), weight 72.6 kg (160 lb), SpO2 98 %.  HEENT-  Normocephalic, no lesions, without obvious abnormality.  Normal external eye and conjunctiva.  Cardiovascular- S1-S2 audible, pulses palpable throughout   Lungs-no rhonchi or wheezing noted, no excessive working breathing.  Saturations within normal limits on RA Extremities- Warm, dry and intact Musculoskeletal-bilateral feet contractures Skin-warm and dry, intact,   Neurological Examination Mental Status: Alert, oriented,to person, place, time, event. Speech fluent without evidence of aphasia.  Able to follow simple commands when she focuses. Patient does answer questions appropriately, but then wanders off topic.  Cranial Nerves: II:  Visual fields grossly normal, left cataract surgery III,IV, VI: ptosis not present, extra-ocular motions intact bilaterally, pupils equal, round, reactive to light and accommodation V,VII: smile symmetric, facial light touch sensation normal bilaterally VIII: hearing normal bilaterally IX,X: uvula rises symmetrically XI: bilateral shoulder shrug XII: midline tongue extension Motor: Right : Upper extremity   5/5  Left:     Upper extremity   5/5  Lower extremity   5/5   Lower extremity   5/5 Tone and bulk:normal tone, bilateral feet contractures Sensory: light touch/ temperature intact throughout, bilaterally Deep Tendon Reflexes: 2+ and symmetric throughout Plantars: Right: downgoing   Left: downgoing Cerebellar: normal finger-to-nose though slow,  normal heel-to-shin test Gait: did not test   Lab Results: Basic Metabolic Panel: Recent Labs  Lab 10/24/17 1341  NA 144  K 3.7  CL 104  CO2 31  GLUCOSE 102*  BUN 11  CREATININE 1.00  CALCIUM 10.0    CBC: Recent Labs  Lab  10/24/17 1341  WBC 6.3  NEUTROABS 4.6  HGB 14.9  HCT 45.3  MCV 97.8  PLT 206    Lipid Panel: No results for input(s): CHOL, TRIG, HDL, CHOLHDL, VLDL, LDLCALC in the last 168 hours.  CBG: No results for input(s): GLUCAP in the last 168 hours.  Imaging: Ct Head Wo Contrast  Result Date: 10/24/2017 CLINICAL DATA:  84 year old female with acute confusion and aphasia. EXAM: CT HEAD WITHOUT CONTRAST TECHNIQUE: Contiguous axial images were obtained from the base of the skull through the vertex without intravenous contrast. COMPARISON:  02/13/2017 head CT and prior studies FINDINGS: Brain: An age indeterminate RIGHT thalamic infarct vs chronic ischemic changes now noted. No  hemorrhage, midline shift, hydrocephalus, mass lesion or mass effect. Atrophy, chronic small-vessel white matter ischemic changes and remote cerebellar infarcts again noted. Vascular: Intracranial atherosclerotic calcifications again noted. Skull: Normal. Negative for fracture or focal lesion. Sinuses/Orbits: A small mucous retention cyst/polyp within the LEFT sphenoid sinus noted. Other: None IMPRESSION: Age indeterminate RIGHT thalamic infarct vs chronic ischemic changes. Atrophy, chronic small-vessel white matter ischemic changes and remote cerebellar infarcts. Electronically Signed   By: Margarette Canada M.D.   On: 10/24/2017 15:50       Laurey Morale, MSN, NP-C [facility] [phone]  10/24/2017, 6:00 PM I have seen the patient reviewed the above note.  Assessment: Jacqueline [name] is an 84 y.o. female with [place]H CVA 2014 ( medial cerebellar) and TIA 06/08/2014, HTN, afib with rvr on eliquis present to [facility] for mild confusion.  I suspect that this is more of a worsening of the underlying baseline and then a true acute change.  I wonder about some small physiological stressor, possibly even constipation, as an etiology of her mild confusion.    Stroke Risk Factors - atrial fibrillation and hypertension, age,  afib  Recommend -- MRI brain  --Stroke work-up only if positive --Limit sedating medications --Continue Eliquis   Roland Rack, MD [facility] [phone]  If 7pm- 7am, please page neurology on call as listed in [place].

## 2017-10-24 NOTE — ED Notes (Signed)
Neuro at bedside, aware pt has a bed assignment.

## 2017-10-24 NOTE — ED Notes (Addendum)
Pt's son arrived at bedside. Pt is in imaging at this time.

## 2017-10-24 NOTE — ED Notes (Signed)
Neuro at bedside.

## 2017-10-25 DIAGNOSIS — I272 Pulmonary hypertension, unspecified: Secondary | ICD-10-CM | POA: Diagnosis present

## 2017-10-25 DIAGNOSIS — I739 Peripheral vascular disease, unspecified: Secondary | ICD-10-CM | POA: Diagnosis present

## 2017-10-25 DIAGNOSIS — Z7982 Long term (current) use of aspirin: Secondary | ICD-10-CM | POA: Diagnosis not present

## 2017-10-25 DIAGNOSIS — Z8711 Personal history of peptic ulcer disease: Secondary | ICD-10-CM | POA: Diagnosis not present

## 2017-10-25 DIAGNOSIS — Z9181 History of falling: Secondary | ICD-10-CM | POA: Diagnosis not present

## 2017-10-25 DIAGNOSIS — I2510 Atherosclerotic heart disease of native coronary artery without angina pectoris: Secondary | ICD-10-CM | POA: Diagnosis not present

## 2017-10-25 DIAGNOSIS — M1710 Unilateral primary osteoarthritis, unspecified knee: Secondary | ICD-10-CM | POA: Diagnosis present

## 2017-10-25 DIAGNOSIS — G8194 Hemiplegia, unspecified affecting left nondominant side: Secondary | ICD-10-CM | POA: Diagnosis present

## 2017-10-25 DIAGNOSIS — R4701 Aphasia: Secondary | ICD-10-CM | POA: Diagnosis present

## 2017-10-25 DIAGNOSIS — R279 Unspecified lack of coordination: Secondary | ICD-10-CM | POA: Diagnosis not present

## 2017-10-25 DIAGNOSIS — I639 Cerebral infarction, unspecified: Secondary | ICD-10-CM | POA: Diagnosis present

## 2017-10-25 DIAGNOSIS — R4182 Altered mental status, unspecified: Secondary | ICD-10-CM | POA: Diagnosis present

## 2017-10-25 DIAGNOSIS — E785 Hyperlipidemia, unspecified: Secondary | ICD-10-CM | POA: Diagnosis not present

## 2017-10-25 DIAGNOSIS — I5032 Chronic diastolic (congestive) heart failure: Secondary | ICD-10-CM

## 2017-10-25 DIAGNOSIS — F411 Generalized anxiety disorder: Secondary | ICD-10-CM | POA: Diagnosis present

## 2017-10-25 DIAGNOSIS — R2689 Other abnormalities of gait and mobility: Secondary | ICD-10-CM | POA: Diagnosis not present

## 2017-10-25 DIAGNOSIS — I10 Essential (primary) hypertension: Secondary | ICD-10-CM | POA: Diagnosis not present

## 2017-10-25 DIAGNOSIS — K219 Gastro-esophageal reflux disease without esophagitis: Secondary | ICD-10-CM | POA: Diagnosis present

## 2017-10-25 DIAGNOSIS — Z743 Need for continuous supervision: Secondary | ICD-10-CM | POA: Diagnosis not present

## 2017-10-25 DIAGNOSIS — Z823 Family history of stroke: Secondary | ICD-10-CM | POA: Diagnosis not present

## 2017-10-25 DIAGNOSIS — R1312 Dysphagia, oropharyngeal phase: Secondary | ICD-10-CM | POA: Diagnosis not present

## 2017-10-25 DIAGNOSIS — Z7901 Long term (current) use of anticoagulants: Secondary | ICD-10-CM | POA: Diagnosis not present

## 2017-10-25 DIAGNOSIS — I63411 Cerebral infarction due to embolism of right middle cerebral artery: Secondary | ICD-10-CM | POA: Diagnosis not present

## 2017-10-25 DIAGNOSIS — Z66 Do not resuscitate: Secondary | ICD-10-CM | POA: Diagnosis present

## 2017-10-25 DIAGNOSIS — R41841 Cognitive communication deficit: Secondary | ICD-10-CM | POA: Diagnosis not present

## 2017-10-25 DIAGNOSIS — R29700 NIHSS score 0: Secondary | ICD-10-CM | POA: Diagnosis present

## 2017-10-25 DIAGNOSIS — G459 Transient cerebral ischemic attack, unspecified: Secondary | ICD-10-CM | POA: Diagnosis not present

## 2017-10-25 DIAGNOSIS — Z833 Family history of diabetes mellitus: Secondary | ICD-10-CM | POA: Diagnosis not present

## 2017-10-25 DIAGNOSIS — I480 Paroxysmal atrial fibrillation: Secondary | ICD-10-CM

## 2017-10-25 DIAGNOSIS — M6281 Muscle weakness (generalized): Secondary | ICD-10-CM | POA: Diagnosis not present

## 2017-10-25 DIAGNOSIS — K589 Irritable bowel syndrome without diarrhea: Secondary | ICD-10-CM | POA: Diagnosis present

## 2017-10-25 DIAGNOSIS — I110 Hypertensive heart disease with heart failure: Secondary | ICD-10-CM | POA: Diagnosis present

## 2017-10-25 DIAGNOSIS — Z8673 Personal history of transient ischemic attack (TIA), and cerebral infarction without residual deficits: Secondary | ICD-10-CM

## 2017-10-25 DIAGNOSIS — Z87891 Personal history of nicotine dependence: Secondary | ICD-10-CM | POA: Diagnosis not present

## 2017-10-25 DIAGNOSIS — F0390 Unspecified dementia without behavioral disturbance: Secondary | ICD-10-CM | POA: Diagnosis present

## 2017-10-25 DIAGNOSIS — I69328 Other speech and language deficits following cerebral infarction: Secondary | ICD-10-CM | POA: Diagnosis not present

## 2017-10-25 LAB — LIPID PANEL
CHOLESTEROL: 190 mg/dL (ref 0–200)
HDL: 74 mg/dL (ref >40)
LDL Cholesterol: 93 mg/dL (ref 0–99)
Total CHOL/HDL Ratio: 2.6 RATIO
Triglycerides: 115 mg/dL (ref <150)
VLDL: 23 mg/dL (ref 0–40)

## 2017-10-25 LAB — HEMOGLOBIN A1C
HEMOGLOBIN A1C: 5.3 % (ref 4.8–5.6)
MEAN PLASMA GLUCOSE: 105.41 mg/dL

## 2017-10-25 MED ORDER — DILTIAZEM HCL ER COATED BEADS 180 MG PO CP24
180.0000 mg | ORAL_CAPSULE | Freq: Two times a day (BID) | ORAL | Status: DC
  Administered 2017-10-25 – 2017-10-28 (×7): 180 mg via ORAL
  Filled 2017-10-25 (×7): qty 1

## 2017-10-25 MED ORDER — DILTIAZEM HCL ER BEADS 180 MG PO CP24: 180.0000 mg | ORAL_CAPSULE | Freq: Two times a day (BID) | ORAL | Status: DC

## 2017-10-25 MED ORDER — APIXABAN 5 MG PO TABS
5.0000 mg | ORAL_TABLET | Freq: Two times a day (BID) | ORAL | Status: DC
  Administered 2017-10-25 – 2017-10-28 (×7): 5 mg via ORAL
  Filled 2017-10-25 (×6): qty 1

## 2017-10-25 MED ORDER — ATORVASTATIN CALCIUM 40 MG PO TABS
40.0000 mg | ORAL_TABLET | Freq: Every day | ORAL | Status: DC
  Filled 2017-10-25 (×3): qty 1

## 2017-10-25 NOTE — Progress Notes (Signed)
Progress Note    Jacqueline [name]  IRS:[id number] DOB: 30-Jan-1929  DOA: 10/24/2017 PCP: Jacqueline Manes, MD    Brief Narrative:    Medical records reviewed and are as summarized below:  Jacqueline [name] is an 84 y.o. female with medical history significant of HTN; CVA/TIA; GI bleeding; and afib on Eliquis presenting with speech problems.     Assessment/Plan:   Active Problems:   PAF (paroxysmal atrial fibrillation) (HCC)   Chronic diastolic CHF (congestive heart failure) (HCC)   HLD (hyperlipidemia)   CVA (cerebral vascular accident) ([place])  CVA -MRI/MRA: 3 mm acute/early subacute infarction within the right posterior parietal cortex. No associated hemorrhage or mass effect. -Carotid dopplers and echo not needed per neurology-- known a fib on eliquis -discussed with Dr. Leonie [name] -PT/OT- CIR -consult placed  HTN -Allow permissive HTN -resume Diltiazem today and Toprol XL tomm  HLD -LDL: 93 -patient resistant to starting any new meds--- will hold for now  PAF -resume cardizem -tele -Continue Eliquis   Diastolic CHF -Appears compensated at this time -Last echo was in 2/16 and showed preserved EF with grade 1 diastolic dysfunction     Family Communication/Anticipated D/C date and plan/Code Status   DVT prophylaxis: eliquis Code Status: DNR  Family Communication: none at bedside Disposition Plan: CIR eval   Medical Consultants:    Neuro  CIR    Subjective:   Says she still feels "fuzzy headed"  Objective:    Vitals:   10/24/17 2351 10/25/17 0358 10/25/17 0726 10/25/17 1149  BP: (!) 158/89 (!) 157/79 (!) 149/75 (!) 157/83  Pulse: 70 68 67 80  Resp:  18 16 18   Temp: (!) 97.5 F (36.4 C) 98.4 F (36.9 C) 97.7 F (36.5 C) 98 F (36.7 C)  TempSrc: Oral Oral Oral Oral  SpO2: 95% 98% 96% 99%  Weight:      Height:        Intake/Output Summary (Last 24 hours) at 10/25/2017 1301 Last data filed at 10/25/2017 0900 Gross per 24 hour  Intake 320 ml    Output 300 ml  Net 20 ml   Filed Weights   10/24/17 1336 10/24/17 1843  Weight: 72.6 kg (160 lb) 61.9 kg (136 lb 7.4 oz)    Exam: In bed, NAD No increased work of breathing, no wheezing Alert and cooperative Moves all 4 ext  Data Reviewed:   I have personally reviewed following labs and imaging studies:  Labs: Labs show the following:   Basic Metabolic Panel: Recent Labs  Lab 10/24/17 1341  NA 144  K 3.7  CL 104  CO2 31  GLUCOSE 102*  BUN 11  CREATININE 1.00  CALCIUM 10.0   GFR Estimated Creatinine Clearance: 32.6 mL/min (by C-G formula based on SCr of 1 mg/dL). Liver Function Tests: No results for input(s): AST, ALT, ALKPHOS, BILITOT, PROT, ALBUMIN in the last 168 hours. No results for input(s): LIPASE, AMYLASE in the last 168 hours. No results for input(s): AMMONIA in the last 168 hours. Coagulation profile No results for input(s): INR, PROTIME in the last 168 hours.  CBC: Recent Labs  Lab 10/24/17 1341  WBC 6.3  NEUTROABS 4.6  HGB 14.9  HCT 45.3  MCV 97.8  PLT 206   Cardiac Enzymes: No results for input(s): CKTOTAL, CKMB, CKMBINDEX, TROPONINI in the last 168 hours. BNP (last 3 results) No results for input(s): PROBNP in the last 8760 hours. CBG: No results for input(s): GLUCAP in the last 168 hours. D-Dimer: No results  for input(s): DDIMER in the last 72 hours. Hgb A1c: Recent Labs    10/25/17 0452  HGBA1C 5.3   Lipid Profile: Recent Labs    10/25/17 0452  CHOL 190  HDL 74  LDLCALC 93  TRIG 115  CHOLHDL 2.6   Thyroid function studies: Recent Labs    10/24/17 1854  TSH 1.539   Anemia work up: No results for input(s): VITAMINB12, FOLATE, FERRITIN, TIBC, IRON, RETICCTPCT in the last 72 hours. Sepsis Labs: Recent Labs  Lab 10/24/17 1341  WBC 6.3    Microbiology No results found for this or any previous visit (from the past 240 hour(s)).  Procedures and diagnostic studies:  Ct Head Wo Contrast  Result Date:  10/24/2017 CLINICAL DATA:  84 year old female with acute confusion and aphasia. EXAM: CT HEAD WITHOUT CONTRAST TECHNIQUE: Contiguous axial images were obtained from the base of the skull through the vertex without intravenous contrast. COMPARISON:  02/13/2017 head CT and prior studies FINDINGS: Brain: An age indeterminate RIGHT thalamic infarct vs chronic ischemic changes now noted. No hemorrhage, midline shift, hydrocephalus, mass lesion or mass effect. Atrophy, chronic small-vessel white matter ischemic changes and remote cerebellar infarcts again noted. Vascular: Intracranial atherosclerotic calcifications again noted. Skull: Normal. Negative for fracture or focal lesion. Sinuses/Orbits: A small mucous retention cyst/polyp within the LEFT sphenoid sinus noted. Other: None IMPRESSION: Age indeterminate RIGHT thalamic infarct vs chronic ischemic changes. Atrophy, chronic small-vessel white matter ischemic changes and remote cerebellar infarcts. Electronically Signed   By: Margarette Canada M.D.   On: 10/24/2017 15:50   Mr Brain Wo Contrast  Result Date: 10/24/2017 CLINICAL DATA:  84 y/o F; patient presenting with word-finding difficulty. History of stroke, TIA, hypertension, and atrial fibrillation. EXAM: MRI HEAD WITHOUT CONTRAST TECHNIQUE: Multiplanar, multiecho pulse sequences of the brain and surrounding structures were obtained without intravenous contrast. COMPARISON:  10/24/2017 CT head. 04/19/2015 MRI and MRA of the head. FINDINGS: Brain: 3 mm focus of reduced diffusion within the right posterior parietal cortex (series 5, image 68) compatible with acute/early subacute infarction. No associated hemorrhage or mass effect. Several small chronic infarctions are present within the cerebellum bilaterally. Small chronic lacunar infarcts in the right thalamus, left lentiform nucleus, right caudate head. Small chronic cortical infarcts of the bilateral occipital lobes 6 and right frontal lobe. Numerous patchy  nonspecific foci of T2 FLAIR hyperintense signal abnormality in subcortical and periventricular white matter are compatible with advanced chronic microvascular ischemic changes for age. Moderate brain parenchymal volume loss. No new susceptibility hypointensity to indicate interval intracranial hemorrhage. Vascular: Normal flow voids. Skull and upper cervical spine: Normal marrow signal. Sinuses/Orbits: Negative. Other: Left intra-ocular lens replacement. IMPRESSION: 1. 3 mm acute/early subacute infarction within the right posterior parietal cortex. No associated hemorrhage or mass effect. 2. Advanced chronic microvascular ischemic changes and moderate parenchymal volume loss of the brain. 3. Several small stable chronic infarctions within the cerebellum, basal ganglia, and right frontal cortex. These results will be called to the ordering clinician or representative by the Radiologist Assistant, and communication documented in the PACS or zVision Dashboard. Electronically Signed   By: Kristine Garbe M.D.   On: 10/24/2017 23:53    Medications:   .  stroke: mapping our early stages of recovery book   Does not apply Once  . ALPRAZolam  0.25 mg Oral Once  . apixaban  5 mg Oral BID  . diltiazem  180 mg Oral BID  . pantoprazole  40 mg Oral Daily  . polyethylene glycol  17 g Oral Daily   Continuous Infusions:   LOS: 0 days   Geradine Girt  [facility]   *Please refer to [email or web address], password [email or web address] to get updated schedule on who will round on this patient, as hospitalists switch teams weekly. If 7PM-7AM, please contact night-coverage at [email or web address], password [email or web address] for any overnight needs.  10/25/2017, 1:01 PM

## 2017-10-25 NOTE — Evaluation (Signed)
Occupational Therapy Evaluation Patient Details Name: Jacqueline [name] MRN: [id number] DOB: 05/03/1928 Today's Date: 10/25/2017    History of Present Illness Pt is an 84 y.o. female presenting with speech problems. MRI on 6/25 + for R parietal punctate infarct-acute/subacute. PMHx: HTN, TIA, GI bleeding, Afib.   Clinical Impression   Pt reports she was mod I with ADL PTA with occasional use of RW. Currently pt requires min assist for short distance functional mobility and min-max assist for ADL. Pt presenting with cognitive deficits--decreased attention to task, poor safety awareness, difficulty sequencing familiar tasks despite multimodal cues, and follows one step commands inconsistently impacting her independence and safety with ADL and functional mobility. Recommending CIR level therapies to maximize independence and safety with ADL and functional mobility prior to return home. Pt would benefit from continued skilled OT to address established goals.    Follow Up Recommendations  CIR;Supervision/Assistance - 24 hour    Equipment Recommendations  Other (comment)(TBD at next venue)    Recommendations for Other Services PT consult;Rehab consult     Precautions / Restrictions Precautions Precautions: Fall Restrictions Weight Bearing Restrictions: No      Mobility Bed Mobility Overal bed mobility: Needs Assistance Bed Mobility: Supine to Sit;Sit to Supine     Supine to sit: Min assist Sit to supine: Min guard   General bed mobility comments: Min HHA for trunk elevation from supine to sit. Cues thorughout for sequencing. Significant increased time and effort as pt is easily distracted  Transfers Overall transfer level: Needs assistance Equipment used: Rolling walker (2 wheeled) Transfers: Sit to/from Stand Sit to Stand: Min assist         General transfer comment: For balance, cues for initiation and sequencing. Pt able to take side steps x5 to [facility] for repositioning. Declining  transfer to chair    Balance Overall balance assessment: Needs assistance;History of Falls Sitting-balance support: Feet supported Sitting balance-Leahy Scale: Fair Sitting balance - Comments: Cues for anterior lean, falling posteriorly at times Postural control: Posterior lean Standing balance support: Bilateral upper extremity supported Standing balance-Leahy Scale: Poor Standing balance comment: RW for support                           ADL either performed or assessed with clinical judgement   ADL Overall ADL's : Needs assistance/impaired Eating/Feeding: Set up;Bed level   Grooming: Set up;Supervision/safety;Wash/dry face;Sitting   Upper Body Bathing: Minimal assistance;Sitting   Lower Body Bathing: Maximal assistance;Sit to/from stand   Upper Body Dressing : Minimal assistance;Sitting   Lower Body Dressing: Maximal assistance;Sit to/from stand Lower Body Dressing Details (indicate cue type and reason): Pt unable to sequence how to put sock on even with max cues Toilet Transfer: Minimal assistance;Stand-pivot;RW;Cueing for sequencing Toilet Transfer Details (indicate cue type and reason): Cues throughout, simulated by sit to stand from EOB with side stepping toward [facility]         Functional mobility during ADLs: Minimal assistance;Rolling walker(for few steps only)       Vision   Additional Comments: Difficult to assess due to impaired cognition--needs further assessment     Perception     Praxis      Pertinent Vitals/Pain Pain Assessment: Faces Faces Pain Scale: Hurts little more Pain Location: bil shoulders Pain Descriptors / Indicators: Aching Pain Intervention(s): Repositioned;Premedicated before session;Monitored during session;Limited activity within patient's tolerance     Hand Dominance Right   Extremity/Trunk Assessment Upper Extremity Assessment Upper Extremity Assessment: Generalized  weakness;Difficult to assess due to impaired  cognition   Lower Extremity Assessment Lower Extremity Assessment: Defer to PT evaluation   Cervical / Trunk Assessment Cervical / Trunk Assessment: Kyphotic   Communication Communication Communication: Expressive difficulties(slurred speech, word finding)   Cognition Arousal/Alertness: Awake/alert Behavior During Therapy: WFL for tasks assessed/performed Overall Cognitive Status: No family/caregiver present to determine baseline cognitive functioning Area of Impairment: Attention;Following commands;Safety/judgement;Awareness;Problem solving                   Current Attention Level: Sustained   Following Commands: Follows one step commands inconsistently;Follows one step commands with increased time Safety/Judgement: Decreased awareness of safety;Decreased awareness of deficits Awareness: Intellectual Problem Solving: Slow processing;Decreased initiation;Difficulty sequencing;Requires verbal cues;Requires tactile cues General Comments: Tangential speech, difficulty sequencing how she was going to sit at EOB, difficulty figuring out how to put on a sock. Requires simple direct cues   General Comments       Exercises     Shoulder Instructions      Home Living Family/patient expects to be discharged to:: Private residence Living Arrangements: Alone Available Help at Discharge: Family;Available 24 hours/day(son staying with her until August) Type of Home: House Home Access: Level entry     Home Layout: One level     Bathroom Shower/Tub: Occupational psychologist: Handicapped height     Home Equipment: Grab bars - tub/shower;Shower seat;Walker - 4 wheels          Prior Functioning/Environment Level of Independence: Independent with assistive device(s)        Comments: Pt reports she uses RW for nighttime bathroom trips, only sponge bathes due to hx of falls in shower        OT Problem List: Decreased strength;Decreased activity  tolerance;Impaired balance (sitting and/or standing);Decreased cognition;Decreased safety awareness;Decreased knowledge of use of DME or AE;Pain      OT Treatment/Interventions: Self-care/ADL training;Therapeutic exercise;Neuromuscular education;Energy conservation;DME and/or AE instruction;Therapeutic activities;Cognitive remediation/compensation;Patient/family education;Balance training;Visual/perceptual remediation/compensation    OT Goals(Current goals can be found in the care plan section) Acute Rehab OT Goals Patient Stated Goal: decrease shoulder pain OT Goal Formulation: With patient Time For Goal Achievement: 11/08/17 Potential to Achieve Goals: Good ADL Goals Pt Will Perform Grooming: with supervision;standing Pt Will Perform Upper Body Bathing: with supervision;sitting Pt Will Perform Lower Body Bathing: with supervision;sit to/from stand Pt Will Transfer to Toilet: with supervision;ambulating;bedside commode Pt Will Perform Toileting - Clothing Manipulation and hygiene: with supervision;sit to/from stand Additional ADL Goal #1: Pt will perform ADL with <2 cues for sequencing and attention to task.  OT Frequency: Min 2X/week   Barriers to D/C:            Co-evaluation              AM-PAC PT "6 Clicks" Daily Activity     Outcome Measure Help from another person eating meals?: A Little Help from another person taking care of personal grooming?: A Little Help from another person toileting, which includes using toliet, bedpan, or urinal?: A Lot Help from another person bathing (including washing, rinsing, drying)?: A Lot Help from another person to put on and taking off regular upper body clothing?: A Little Help from another person to put on and taking off regular lower body clothing?: A Lot 6 Click Score: 15   End of Session Equipment Utilized During Treatment: Rolling walker  Activity Tolerance: Patient tolerated treatment well;Patient limited by pain Patient  left: in bed;with call bell/phone within reach;with bed  alarm set  OT Visit Diagnosis: Unsteadiness on feet (R26.81);Repeated falls (R29.6);Muscle weakness (generalized) (M62.81);Cognitive communication deficit (R41.841) Symptoms and signs involving cognitive functions: Cerebral infarction                Time: 6815-9470 OT Time Calculation (min): 23 min Charges:  OT General Charges $OT Visit: 1 Visit OT Evaluation $OT Eval Moderate Complexity: 1 Mod OT Treatments $Self Care/Home Management : 8-22 mins G-Codes:     Irys Nigh A. Ulice Brilliant, M.S., OTR/L Acute Rehab Department: [phone]  Binnie Kand 10/25/2017, 9:22 AM

## 2017-10-25 NOTE — Evaluation (Signed)
Speech Language Pathology Evaluation Patient Details Name: Jacqueline [name] MRN: [id number] DOB: 08-15-1928 Today's Date: 10/25/2017 Time: 9528-4132 SLP Time Calculation (min) (ACUTE ONLY): 29 min  Problem List:  Patient Active Problem List   Diagnosis Date Noted  . TIA (transient ischemic attack) 10/24/2017  . Cerebral infarction due to embolism of right middle cerebral artery ([place])   . HLD (hyperlipidemia)   . Acute ischemic right MCA stroke ([place]) 06/18/2014  . Facial droop   . Displaced fracture of shaft of left humerus 04/03/2014  . Right arm fracture 04/01/2014  . Displaced spiral fracture of shaft of humerus   . Peripheral arterial disease ([place]) 10/03/2013  . Chronic diastolic CHF (congestive heart failure) ([place]) 08/08/2013  . Chronic venous insufficiency 04/02/2013  . Hypokalemia 01/13/2013  . PAF (paroxysmal atrial fibrillation) ([place]) 10/03/2012  . CAD (coronary artery disease) of artery bypass graft 04/05/2012  . Atrial fibrillation with RVR ([place]) 03/13/2012  . Need for prophylactic vaccination and inoculation against influenza 02/14/2012  . ABDOMINAL WALL HERNIA 01/21/2010  . PARESTHESIA 05/04/2009  . SKIN LESION 01/28/2009  . Nocturia 01/28/2009  . DYSPNEA 01/01/2009  . CVA (cerebral infarction) 11/17/2008  . ALLERGY 10/27/2008  . PAROXYSMAL NOCTURNAL DYSPNEA 10/03/2008  . Osteoarthrosis, unspecified whether generalized or localized, involving lower leg 09/03/2008  . RESTRICTIVE LUNG DISEASE 05/26/2008  . Anemia 01/08/2008  . UNS ADVRS EFF OTH RX MEDICINAL&BIOLOGICAL SBSTNC 11/09/2007  . HEMATOCHEZIA 07/18/2007  . PARANOIA 06/01/2007  . Anxiety state 01/23/2007  . Hypertensive heart disease with CHF ([place]) 01/23/2007  . GI BLEEDING 01/23/2007  . Osteoarthritis 01/23/2007  . DEGENERATIVE DISC DISEASE, LUMBAR SPINE 01/23/2007  . History of peptic ulcer disease 01/23/2007  . Personal history of other diseases of digestive system 01/23/2007   Past Medical History:  Past  Medical History:  Diagnosis Date  . Allergy, unspecified not elsewhere classified   . Anemia, unspecified   . Anxiety state, unspecified   . Atrial fibrillation with rapid ventricular response ([place]) 03/13/12  . Blood in stool   . Complication of anesthesia    "hard to wake me up after colonoscopy" (03/13/12)  . Degeneration of lumbar or lumbosacral intervertebral disc   . GERD (gastroesophageal reflux disease)   . H/O hiatal hernia   . Hemorrhage of gastrointestinal tract, unspecified   . History of IBS   . Osteoarthrosis, unspecified whether generalized or localized, lower leg   . Other diseases of lung, not elsewhere classified   . Other dyspnea and respiratory abnormality   . Personal history of other diseases of digestive system   . Personal history of peptic ulcer disease   . Unspecified adverse effect of other drug, medicinal and biological substance(995.29)   . Unspecified cerebral artery occlusion with cerebral infarction 10/2008   "mini stroke" denies residual (03/13/12)  . Unspecified essential hypertension   . Unspecified paranoid state    Past Surgical History:  Past Surgical History:  Procedure Laterality Date  . DILATION AND CURETTAGE OF UTERUS  1950's?  . ORIF HUMERUS FRACTURE Right 04/03/2014   Procedure: OPEN REDUCTION INTERNAL FIXATION (ORIF) RIGHT HUMERUS DISTAL FRACTURE;  Surgeon: Roseanne Kaufman, MD;  Location: [place];  Service: Orthopedics;  Laterality: Right;  . PILONIDAL CYST / SINUS EXCISION  1957  . SHOULDER ARTHROSCOPY  ~ 2000   right   HPI:   84 y.o. female with PMH CVA 2014  and TIA 06/08/2014, HTN, afib with rvr on eliquis present to [facility] for word finding difficulty. MRI shows 3 mm right  posterior parietal infarct; several small chronic infarctions bilateral cerebellum, right thalamus, left lentiform, right caudate head. Small chronic cortical infarcts of the bilateral occipital lobes and right frontal lobe. Pt lives independently in [place]  townhome.  Assessment / Plan / Recommendation Clinical Impression  Pt presents with cognitive-communicative deficits, baseline unknown, although [name] states her memory is poor, and that her son Jacqueline [name] handles her finances.  She lives independently, does not drive, relies on family for errands/groceries.  Currently, pt presents with dysnomia during conversation, with halting speech during efforts at word-retrieval.  She has difficulty understanding task instructions; responses (written/verbal) are delayed and pt requires prompts to initiate familiar tasks.  Demonstrates impairments in abstraction, delayed auditory recall, functional/verbal problem solving.  If the support is available, agree with OT that pt may benefit from CIR.  SLP will follow for communication/cognitive deficits described above.     SLP Assessment  SLP Recommendation/Assessment: Patient needs continued Speech Lanaguage Pathology Services SLP Visit Diagnosis: Cognitive communication deficit (R41.841)    Follow Up Recommendations  Inpatient Rehab    Frequency and Duration min 2x/week  2 weeks      SLP Evaluation Cognition  Overall Cognitive Status: No family/caregiver present to determine baseline cognitive functioning Arousal/Alertness: Awake/alert Orientation Level: Oriented X4 Attention: Sustained Sustained Attention: Impaired Sustained Attention Impairment: Verbal basic Memory: Impaired Memory Impairment: Storage deficit;Retrieval deficit Awareness: Impaired Awareness Impairment: Emergent impairment Problem Solving: Impaired Problem Solving Impairment: Verbal basic Executive Function: Reasoning Reasoning: Impaired Safety/Judgment: Impaired       Comprehension  Auditory Comprehension Overall Auditory Comprehension: Impaired Yes/No Questions: Within Functional Limits Commands: Impaired Two Step Basic Commands: 25-49% accurate Conversation: Simple Reading Comprehension Reading Status: Not tested     Expression Expression Primary Mode of Expression: Verbal Verbal Expression Overall Verbal Expression: Impaired Naming: Impairment Other Naming Comments: dysnomic during conversational speech Written Expression Dominant Hand: Right   Oral / Motor  Oral Motor/Sensory Function Overall Oral Motor/Sensory Function: Within functional limits Motor Speech Overall Motor Speech: Appears within functional limits for tasks assessed   GO                    Juan Quam Laurice 10/25/2017, 9:59 AM

## 2017-10-25 NOTE — Progress Notes (Addendum)
STROKE TEAM PROGRESS NOTE  HPI ( Dr Leonel Ramsay )Jacqueline [name] is an 84 y.o. female with [place] CVA 2014 ( medial cerebellar) and TIA 06/08/2014, HTN, afib with rvr on eliquis present to [facility] for word finding difficulty.   Patient appears confused and not a reliable historian.  Patient reports that today she woke up and then took a nap about noon. When she woke up at noon she was having a lot of difficulty finding her words.  She pressed the life alert button because she was worried that she was having another TIA.  Patient also states that she feels that she has not been thinking straight for awhile. She endorses that she lives alone, but her son pays the bills and separates her medication for her. She denies missing any doses of medications. Patient denies any weakness, numbness, tingling, facial droop, slurred speech, HA, or vision problems. Son reports that she had some slurred speech earlier today, but that it is not currently slurred.   CT head shows a right thalamic infart vs chronic ischemic changes. Chronic small vessel white matter ischemic changes and old cerebellar infarct.   Date last known well: unable to determine  Time last known well: unable to determine  tPA Given: no: contraindicated    Modified Rankin: Rankin Score=2  NIHSS: 0   INTERVAL HISTORY No family is at the bedside.  "I feel dumb, not thinking clearly." "I have trouble remembering, please call my son."  No change in neuro condition over night. She hopes to return to home today. She states she was compliant with eliquis. She does not want to change and request alternative blood thinner   Vitals:   10/24/17 2000 10/24/17 2351 10/25/17 0358 10/25/17 0726  BP: (!) 175/98 (!) 158/89 (!) 157/79 (!) 149/75  Pulse: 74 70 68 67  Resp:   18 16  Temp: 98 F (36.7 C) (!) 97.5 F (36.4 C) 98.4 F (36.9 C) 97.7 F (36.5 C)  TempSrc: Oral Oral Oral Oral  SpO2: 98% 95% 98% 96%  Weight:      Height:        CBC:   Recent Labs  Lab 10/24/17 1341  WBC 6.3  NEUTROABS 4.6  HGB 14.9  HCT 45.3  MCV 97.8  PLT 502    Basic Metabolic Panel:  Recent Labs  Lab 10/24/17 1341  NA 144  K 3.7  CL 104  CO2 31  GLUCOSE 102*  BUN 11  CREATININE 1.00  CALCIUM 10.0   Lipid Panel:     Component Value Date/Time   CHOL 190 10/25/2017 0452   TRIG 115 10/25/2017 0452   HDL 74 10/25/2017 0452   CHOLHDL 2.6 10/25/2017 0452   VLDL 23 10/25/2017 0452   LDLCALC 93 10/25/2017 0452   HgbA1c:  Lab Results  Component Value Date   HGBA1C 5.3 10/25/2017   Urine Drug Screen:     Component Value Date/Time   LABOPIA POSITIVE (A) 06/18/2014 1950   COCAINSCRNUR NONE DETECTED 06/18/2014 1950   LABBENZ POSITIVE (A) 06/18/2014 1950   AMPHETMU NONE DETECTED 06/18/2014 1950   THCU NONE DETECTED 06/18/2014 1950   LABBARB NONE DETECTED 06/18/2014 1950    Alcohol Level     Component Value Date/Time   ETH <10 02/13/2017 1810    IMAGING Ct Head Wo Contrast  Result Date: 10/24/2017 CLINICAL DATA:  84 year old female with acute confusion and aphasia. EXAM: CT HEAD WITHOUT CONTRAST TECHNIQUE: Contiguous axial images were obtained from the base of the  skull through the vertex without intravenous contrast. COMPARISON:  02/13/2017 head CT and prior studies FINDINGS: Brain: An age indeterminate RIGHT thalamic infarct vs chronic ischemic changes now noted. No hemorrhage, midline shift, hydrocephalus, mass lesion or mass effect. Atrophy, chronic small-vessel white matter ischemic changes and remote cerebellar infarcts again noted. Vascular: Intracranial atherosclerotic calcifications again noted. Skull: Normal. Negative for fracture or focal lesion. Sinuses/Orbits: A small mucous retention cyst/polyp within the LEFT sphenoid sinus noted. Other: None IMPRESSION: Age indeterminate RIGHT thalamic infarct vs chronic ischemic changes. Atrophy, chronic small-vessel white matter ischemic changes and remote cerebellar infarcts.  Electronically Signed   By: Margarette Canada M.D.   On: 10/24/2017 15:50   Mr Brain Wo Contrast  Result Date: 10/24/2017 CLINICAL DATA:  84 y/o F; patient presenting with word-finding difficulty. History of stroke, TIA, hypertension, and atrial fibrillation. EXAM: MRI HEAD WITHOUT CONTRAST TECHNIQUE: Multiplanar, multiecho pulse sequences of the brain and surrounding structures were obtained without intravenous contrast. COMPARISON:  10/24/2017 CT head. 04/19/2015 MRI and MRA of the head. FINDINGS: Brain: 3 mm focus of reduced diffusion within the right posterior parietal cortex (series 5, image 68) compatible with acute/early subacute infarction. No associated hemorrhage or mass effect. Several small chronic infarctions are present within the cerebellum bilaterally. Small chronic lacunar infarcts in the right thalamus, left lentiform nucleus, right caudate head. Small chronic cortical infarcts of the bilateral occipital lobes 6 and right frontal lobe. Numerous patchy nonspecific foci of T2 FLAIR hyperintense signal abnormality in subcortical and periventricular white matter are compatible with advanced chronic microvascular ischemic changes for age. Moderate brain parenchymal volume loss. No new susceptibility hypointensity to indicate interval intracranial hemorrhage. Vascular: Normal flow voids. Skull and upper cervical spine: Normal marrow signal. Sinuses/Orbits: Negative. Other: Left intra-ocular lens replacement. IMPRESSION: 1. 3 mm acute/early subacute infarction within the right posterior parietal cortex. No associated hemorrhage or mass effect. 2. Advanced chronic microvascular ischemic changes and moderate parenchymal volume loss of the brain. 3. Several small stable chronic infarctions within the cerebellum, basal ganglia, and right frontal cortex. These results will be called to the ordering clinician or representative by the Radiologist Assistant, and communication documented in the PACS or zVision  Dashboard. Electronically Signed   By: Kristine Garbe M.D.   On: 10/24/2017 23:53    PHYSICAL EXAM  pleasant elderly Caucasian lady not in distress. . Afebrile. Head is nontraumatic. Neck is supple without bruit.    Cardiac exam no murmur or gallop. Lungs are clear to auscultation. Distal pulses are well felt. Neurological Exam :  Awake alert oriented 2. Diminished attention, registration and recall. Poor short-term memory. Extra ocular movements are full range. She has some word finding difficulties and slight nonfluent speech. No paraphasic errors. Pupils irregular postsurgical but reactive. Fundi not visualized. Face is symmetric. Palatal movements are normal. Tongue is midline. Motor system exam reveals symmetric upper and lower extremity strength without drift or focal weakness. Deep tendon reflexes are symmetric. Plantars are downgoing. Coordination is slow but accurate. Sensation is intact. Gait not tested. ASSESSMENT/PLAN Jacqueline [name] is a 84 y.o. female with history of stroke in 2014, TIA in 2016, hypertension, atrial fibrillation on Eliquis, family history of stroke stroke presenting with word finding difficulties.   Stroke:   Tiny right parietal punctate infarct embolic secondary to known atrial fibrillation   CT head age indeterminate right thalamic infarct versus chronic ischemic changes.  Mild vessel disease.  Atrophy.  Old cerebellar infarcts.  MRI right posterior parietal cortex infarct.  Small vessel disease.  Atrophy.  Old cerebellar, basal ganglia and right frontal cortex infarcts.  2D Echo given recent echo in February, no need to repeat.  EF 55 to 60% with no source of embolus.  Moderate pulmonary hypertension.  LDL 93  HgbA1c 5.3  Eliquis for VTE prophylaxis  Eliquis (apixaban) daily prior to admission, now on aspirin 325 mg daily and Eliquis (apixaban) daily.  No indication for additional aspirin.  Will discontinue aspirin.  Continue Eliquis at time of  discharge  Therapy recommendations: CIR  Disposition:  pending (son lives w/ her)  Further work-up indicated from stroke standpoint. Stroke team will sign off. Follow up in the office in 4 weeks. Orders placed.  Atrial Fibrillation  Home anticoagulation:  Eliquis (apixaban) daily continued in the hospital  CHA2DS2-VASc Score = at least 6, ?2 oral anticoagulation recommended  Age in Years:  ?68   +2    Sex:  Female   Female   +1    Hypertension History:  yes   +1     Diabetes Mellitus:  0  Congestive Heart Failure History:  0  Vascular Disease History:  0     Stroke/TIA/Thromboembolism History:  yes   +2  Continue Eliquis at discharge   Hypertension  Stable . BP goal normotensive  Hyperlipidemia  Home meds: No statin  LDL 93, goal < 70  Add Lipitor 40  Continue statin at discharge  Other Stroke Risk Factors  Advanced age  Former Cigarette smoker  Hx stroke/TIA  06/2014 Non-dominant right MCA multiple patchy punctate infarct, embolic secondary to atrial fibrillation. on Xarelto but on low dose at 15mg  daily. Felt to inadequate dose. Switched to eliquis with full dosing.  10/2012 left mid cerebellar infarct, embolic secondary to known atrial fibrillation on Xarelto prior to admission, continued at discharge.  Family hx stroke (mother, father, maternal grandmother)  Diastolic CHF, compensated  Hospital day # 0  Burnetta Sabin, MSN, APRN, ANVP-BC, AGPCNP-BC Advanced Practice Stroke Nurse [facility] for Schedule & Pager information 10/25/2017 1:34 PM  I have personally examined this patient, reviewed notes, independently viewed imaging studies, participated in medical decision making and plan of care.ROS completed by me personally and pertinent positives fully documented  I have made any additions or clarifications directly to the above note. Agree with note above. She presented with sudden onset of speech difficulties secondary to small  embolic right parietal infarct from atrial fibrillation despite being on anticoagulation with eliquis. I discussed alternative treatment options with the patient but she does not want to change eliquis. I do not recommend adding aspirin as it will increase bleeding risk without obtaining clear benefit. Patient's family was not available at the bedside for discussion. Discussed with Dr. Eliseo Squires. Greater than 50% time during this 25 minute visit was spent on counseling and coordination of care about her embolic stroke and answering questions. Stroke team will sign off. No further stroke workup is necessary. Kindly call for questions. Antony Contras, MD Medical Director [facility] Pager: [phone] 10/25/2017 5:09 PM     To contact Stroke Continuity provider, please refer to [email or web address] After hours, contact General Neurology

## 2017-10-25 NOTE — Progress Notes (Signed)
Availability of MRI results were paged to [facility], on call for [facility].

## 2017-10-25 NOTE — Progress Notes (Signed)
Initial Nutrition Assessment  DOCUMENTATION CODES:   Not applicable  INTERVENTION:  Continue heart healthy diet.  Encourage adequate PO intake.   NUTRITION DIAGNOSIS:   Increased nutrient needs related to chronic illness(CHF) as evidenced by estimated needs.  GOAL:   Patient will meet greater than or equal to 90% of their needs  MONITOR:   PO intake, Labs, Weight trends, I & O's, Skin  REASON FOR ASSESSMENT:   Consult (TIA)  ASSESSMENT:   84 y.o. female presenting with speech problems. MRI on 6/25 + for R parietal punctate infarct-acute/subacute. PMHx: HTN, TIA, GI bleeding, Afib, CHF.  Meal completion has been 95-100%. Pt reports having a good appetite currently and PTA with no other difficulties. Pt reports only consuming one meal a day usually at home which consists of a BBQ sandwich or fast food from cook out. She then reports snacking for the rest of the day. Usual body weight reported to be ~146-150 lbs. Pt with a 15% weight loss in 7 months. Pt unaware of weight loss. Pt encouraged to eat her food at meals.   NUTRITION - FOCUSED PHYSICAL EXAM:    Most Recent Value  Orbital Region  No depletion  Upper Arm Region  No depletion  Thoracic and Lumbar Region  No depletion  Buccal Region  No depletion  Temple Region  No depletion  Clavicle Bone Region  No depletion  Clavicle and Acromion Bone Region  No depletion  Scapular Bone Region  No depletion  Dorsal Hand  No depletion  Patellar Region  No depletion  Anterior Thigh Region  No depletion  Posterior Calf Region  No depletion  Edema (RD Assessment)  None  Hair  Reviewed  Eyes  Reviewed  Mouth  Reviewed  Skin  Reviewed  Nails  Reviewed       Diet Order:   Diet Order           Diet Heart Room service appropriate? Yes; Fluid consistency: Thin  Diet effective ____          EDUCATION NEEDS:   Not appropriate for education at this time  Skin:  Skin Assessment: Reviewed RN Assessment  Last BM:   6/25  Height:   Ht Readings from Last 1 Encounters:  10/24/17 5' (1.524 m)    Weight:   Wt Readings from Last 1 Encounters:  10/24/17 136 lb 7.4 oz (61.9 kg)    Ideal Body Weight:  45.45 kg  BMI:  Body mass index is 26.65 kg/m.  Estimated Nutritional Needs:   Kcal:  1500-1750  Protein:  70-80 grams  Fluid:  Per MD    Corrin Parker, MS, RD, LDN Pager # [phone] After hours/ weekend pager # [phone]

## 2017-10-25 NOTE — Progress Notes (Signed)
Pt refusing lipitor. Educated on medication. States that it hasn't worked in the past.

## 2017-10-25 NOTE — Discharge Instructions (Signed)

## 2017-10-25 NOTE — Progress Notes (Signed)
MRI brain results called to neuro on call.  KJKG, NP [facility]

## 2017-10-25 NOTE — Progress Notes (Signed)
Pt educated on ASA, refused med.

## 2017-10-25 NOTE — Evaluation (Signed)
Physical Therapy Evaluation Patient Details Name: Jacqueline [name] MRN: [id number] DOB: [birth year] Today's Date: 10/25/2017   History of Present Illness  Pt is an 84 y.o. female presenting with speech problems. MRI on 6/25 + for R parietal punctate infarct-acute/subacute. PMHx: HTN, TIA, GI bleeding, Afib.  Clinical Impression  Orders received for PT evaluation. Patient demonstrates deficits in functional mobility as indicated below. Will benefit from continued skilled PT to address deficits and maximize function. Will see as indicated and progress as tolerated.      Follow Up Recommendations CIR;Supervision/Assistance - 24 hour    Equipment Recommendations  (tbd)    Recommendations for Other Services Rehab consult     Precautions / Restrictions Precautions Precautions: Fall Restrictions Weight Bearing Restrictions: No      Mobility  Bed Mobility Overal bed mobility: Needs Assistance Bed Mobility: Supine to Sit;Sit to Supine     Supine to sit: Min assist Sit to supine: Min guard   General bed mobility comments: Min assist to elevate trunk to upright and scoot to EOB  Transfers Overall transfer level: Needs assistance Equipment used: Rolling walker (2 wheeled) Transfers: Sit to/from Stand Sit to Stand: Min assist         General transfer comment: Min assist for stability with cues for hand placement and positioning  Ambulation/Gait Ambulation/Gait assistance: Mod assist Gait Distance (Feet): 6 Feet Assistive device: Rolling walker (2 wheeled) Gait Pattern/deviations: Step-to pattern;Decreased stride length;Shuffle;Drifts right/left;Trunk flexed Gait velocity: decreased Gait velocity interpretation: <1.31 ft/sec, indicative of household ambulator General Gait Details: patient requires increased physical assist and cues due to cognitive deficits and poor receptivitiy to cues. Patient with noted instability and enviromental awareness  Stairs            Wheelchair  Mobility    Modified Rankin (Stroke Patients Only)       Balance Overall balance assessment: Needs assistance;History of Falls Sitting-balance support: Feet supported Sitting balance-Leahy Scale: Fair Sitting balance - Comments: Cues for anterior lean, falling posteriorly at times Postural control: Posterior lean Standing balance support: Bilateral upper extremity supported Standing balance-Leahy Scale: Poor Standing balance comment: RW for support                             Pertinent Vitals/Pain Pain Assessment: Faces Faces Pain Scale: Hurts little more Pain Location: right shoulder with reaching activity Pain Descriptors / Indicators: Sore Pain Intervention(s): Monitored during session    Home Living Family/patient expects to be discharged to:: Private residence Living Arrangements: Alone Available Help at Discharge: Family;Available 24 hours/day(son staying with her until August) Type of Home: House Home Access: Level entry     Home Layout: One level Home Equipment: Grab bars - tub/shower;Shower seat;Walker - 4 wheels      Prior Function Level of Independence: Independent with assistive device(s)         Comments: Pt reports she uses RW for nighttime bathroom trips, only sponge bathes due to hx of falls in shower     Hand Dominance   Dominant Hand: Right    Extremity/Trunk Assessment   Upper Extremity Assessment Upper Extremity Assessment: Generalized weakness    Lower Extremity Assessment Lower Extremity Assessment: Generalized weakness    Cervical / Trunk Assessment Cervical / Trunk Assessment: Kyphotic  Communication   Communication: Expressive difficulties(slurred speech, word finding)  Cognition Arousal/Alertness: Awake/alert Behavior During Therapy: WFL for tasks assessed/performed Overall Cognitive Status: No family/caregiver present to determine baseline cognitive functioning Area  of Impairment: Attention;Following  commands;Safety/judgement;Awareness;Problem solving                   Current Attention Level: Sustained   Following Commands: Follows one step commands inconsistently;Follows one step commands with increased time Safety/Judgement: Decreased awareness of safety;Decreased awareness of deficits Awareness: Intellectual Problem Solving: Slow processing;Decreased initiation;Difficulty sequencing;Requires verbal cues;Requires tactile cues General Comments: patient with some language of confusion and tangential conversation throughout session. Max cues to direct to task with poor sequencing and receptiveness      General Comments      Exercises     Assessment/Plan    PT Assessment Patient needs continued PT services  PT Problem List Decreased activity tolerance;Decreased balance;Decreased mobility;Decreased coordination;Decreased cognition;Decreased safety awareness;Pain       PT Treatment Interventions DME instruction;Gait training;Functional mobility training;Therapeutic activities;Therapeutic exercise;Balance training;Neuromuscular re-education;Cognitive remediation;Patient/family education    PT Goals (Current goals can be found in the Care Plan section)  Acute Rehab PT Goals Patient Stated Goal: to go home PT Goal Formulation: With patient Time For Goal Achievement: 11/08/17 Potential to Achieve Goals: Fair    Frequency Min 3X/week   Barriers to discharge Decreased caregiver support      Co-evaluation               AM-PAC PT "6 Clicks" Daily Activity  Outcome Measure Difficulty turning over in bed (including adjusting bedclothes, sheets and blankets)?: A Little Difficulty moving from lying on back to sitting on the side of the bed? : Unable Difficulty sitting down on and standing up from a chair with arms (e.g., wheelchair, bedside commode, etc,.)?: Unable Help needed moving to and from a bed to chair (including a wheelchair)?: A Little Help needed walking  in hospital room?: A Little Help needed climbing 3-5 steps with a railing? : A Lot 6 Click Score: 13    End of Session Equipment Utilized During Treatment: Gait belt Activity Tolerance: Patient limited by fatigue Patient left: in bed;with call bell/phone within reach;with bed alarm set Nurse Communication: Mobility status PT Visit Diagnosis: Unsteadiness on feet (R26.81);Difficulty in walking, not elsewhere classified (R26.2);Other symptoms and signs involving the nervous system (R29.898)    Time: 5625-6389 PT Time Calculation (min) (ACUTE ONLY): 21 min   Charges:   PT Evaluation $PT Eval Moderate Complexity: 1 Mod     PT G Codes:        Alben Deeds, PT DPT  Board Certified Neurologic Specialist [place] 10/25/2017, 2:26 PM

## 2017-10-25 NOTE — Progress Notes (Addendum)
MRI shows right parietal punctate stroke-acute/subacute. Hold off eliquis for now. May consider another DOAC or coumadin going forward given stroke on Eliquis. Continue ASA for now. Stroke w/u Stroke team to follow in AM.  -- Amie Portland, MD [facility] Pager: [phone] If 7pm to 7am, please call on call as listed on AMION.

## 2017-10-25 NOTE — Progress Notes (Signed)
Rehab Admissions Coordinator Note:  Patient was screened by Cleatrice Burke for appropriateness for an Inpatient Acute Rehab Consult.  At this time, we are recommending Inpatient Rehab consult if pt would like to be considered for admit. Please advise.  Cleatrice Burke 10/25/2017, 9:30 AM  I can be reached at [phone].

## 2017-10-25 NOTE — Consult Note (Signed)
Physical Medicine and Rehabilitation Consult Reason for Consult: Decreased functional mobility Referring Physician: [facility]  HPI: Jacqueline [name] is a 84 y.o. right-handed female with history of hypertension, TIA, atrial fibrillation maintained on Eliquis.  History taken from chart review and patient. Patient has been living alone up until recently now her son stays with her intermittently.  She used a walker prior to admission.  Presented 10/24/2017 with altered mental status and difficulty finding her words.  CT the head showed reviewed, showing right thalamic abnormality.  Per report, right thalamic infarction versus chronic ischemic changes.  Chronic small vessel white matter ischemic changes and old cerebellar infarction.  Patient did not receive TPA.  MRI completed showing a 3 mm acute early subacute infarction within the right posterior parietal cortex.  Several small stable chronic infarcts within the cerebellum basal ganglia and right frontal cortex.  Eliquis currently has been resumed.  Tolerating a regular diet.  Therapy evaluations completed with recommendations of physical medicine rehab consult.  Review of Systems  Constitutional: Negative for fever.  HENT: Negative for hearing loss.   Eyes: Negative for blurred vision and double vision.  Respiratory: Negative for cough and shortness of breath.   Gastrointestinal: Positive for constipation. Negative for nausea and vomiting.  Genitourinary: Negative for dysuria, flank pain and hematuria.  Musculoskeletal: Positive for myalgias.  Skin: Negative for rash.  Neurological: Positive for sensory change, speech change and focal weakness.  Psychiatric/Behavioral:       Anxiety  All other systems reviewed and are negative.  Past Medical History:  Diagnosis Date  . Allergy, unspecified not elsewhere classified   . Anemia, unspecified   . Anxiety state, unspecified   . Atrial fibrillation with rapid ventricular response ([place]) 03/13/12  .  Blood in stool   . Complication of anesthesia    "hard to wake me up after colonoscopy" (03/13/12)  . Degeneration of lumbar or lumbosacral intervertebral disc   . GERD (gastroesophageal reflux disease)   . H/O hiatal hernia   . Hemorrhage of gastrointestinal tract, unspecified   . History of IBS   . Osteoarthrosis, unspecified whether generalized or localized, lower leg   . Other diseases of lung, not elsewhere classified   . Other dyspnea and respiratory abnormality   . Personal history of other diseases of digestive system   . Personal history of peptic ulcer disease   . Unspecified adverse effect of other drug, medicinal and biological substance(995.29)   . Unspecified cerebral artery occlusion with cerebral infarction 10/2008   "mini stroke" denies residual (03/13/12)  . Unspecified essential hypertension   . Unspecified paranoid state    Past Surgical History:  Procedure Laterality Date  . DILATION AND CURETTAGE OF UTERUS  1950's?  . ORIF HUMERUS FRACTURE Right 04/03/2014   Procedure: OPEN REDUCTION INTERNAL FIXATION (ORIF) RIGHT HUMERUS DISTAL FRACTURE;  Surgeon: Roseanne Kaufman, MD;  Location: [facility];  Service: Orthopedics;  Laterality: Right;  . PILONIDAL CYST / SINUS EXCISION  1957  . SHOULDER ARTHROSCOPY  ~ 2000   right   Family History  Problem Relation Age of Onset  . Diabetes Mother   . CVA Mother   . CVA Father   . CVA Maternal Grandmother    Social History:  reports that she quit smoking about 15 years ago. Her smoking use included cigarettes. She has a 40.00 pack-year smoking history. She has never used smokeless tobacco. She reports that she does not drink alcohol or use drugs. Allergies:  Allergies  Allergen Reactions  . Metoclopramide Hcl Other (See Comments)     Tremors  . Penicillins Itching    Has patient had a PCN reaction causing immediate rash, facial/tongue/throat swelling, SOB or lightheadedness with hypotension: Yes Has patient had a PCN reaction  causing severe rash involving mucus membranes or skin necrosis: Yes Has patient had a PCN reaction that required hospitalization:No Has patient had a PCN reaction occurring within the last 10 years: No If all of the above answers are "NO", then may proceed with Cephalosporin use.   . Aspirin Other (See Comments)    "gave me stomach aches; made me bleed" (03/13/12)  . Zetia [Ezetimibe] Other (See Comments)    Stomach and back pain   Medications Prior to Admission  Medication Sig Dispense Refill  . ALPRAZolam (XANAX) 0.25 MG tablet Take 0.5 tablets (0.125 mg total) by mouth every 6 (six) hours. (Patient taking differently: Take 0.125 mg every 6 (six) hours by mouth. Pt takes as needed) 30 tablet 0  . apixaban (ELIQUIS) 5 MG TABS tablet Take 1 tablet (5 mg total) by mouth 2 (two) times daily. 60 tablet 0  . Cholecalciferol (VITAMIN D3) 5000 UNITS CAPS Take 5,000 Units by mouth daily.    Marland Kitchen diltiazem (TIAZAC) 180 MG 24 hr capsule Take 180 mg by mouth 2 (two) times daily.     Marland Kitchen HYDROcodone-acetaminophen (NORCO/VICODIN) 5-325 MG per tablet Take 1 tablet by mouth every 6 (six) hours as needed for moderate pain.    . metoprolol succinate (TOPROL-XL) 50 MG 24 hr tablet TAKE 1 TABLET (50 MG TOTAL) BY MOUTH 2 (TWO) TIMES DAILY. TAKE WITH OR IMMEDIATELY FOLLOWING A MEAL. (Patient taking differently: Take 50 mg by mouth 2 (two) times daily. Marland Kitchen) 60 tablet 0  . Multiple Vitamins-Minerals (CENTRUM SILVER PO) Take 1 tablet by mouth daily.    . pantoprazole (PROTONIX) 40 MG tablet TAKE 1 TABLET EVERY MORNING (Patient taking differently: Take 40 mg by mouth daily. TAKE 1 TABLET EVERY MORNING) 30 tablet 0    Home: Home Living Family/patient expects to be discharged to:: Private residence Living Arrangements: Alone Available Help at Discharge: Family, Available 24 hours/day(son staying with her until August) Type of Home: House Home Access: Level entry Home Layout: One level Bathroom Shower/Tub: Clinical cytogeneticist: Handicapped height Home Equipment: Grab bars - tub/shower, Civil engineer, contracting, Environmental consultant - 4 wheels  Functional History: Prior Function Level of Independence: Independent with assistive device(s) Comments: Pt reports she uses RW for nighttime bathroom trips, only sponge bathes due to hx of falls in shower Functional Status:  Mobility: Bed Mobility Overal bed mobility: Needs Assistance Bed Mobility: Supine to Sit, Sit to Supine Supine to sit: Min assist Sit to supine: Min guard General bed mobility comments: Min HHA for trunk elevation from supine to sit. Cues thorughout for sequencing. Significant increased time and effort as pt is easily distracted Transfers Overall transfer level: Needs assistance Equipment used: Rolling walker (2 wheeled) Transfers: Sit to/from Stand Sit to Stand: Min assist General transfer comment: For balance, cues for initiation and sequencing. Pt able to take side steps x5 to [facility] for repositioning. Declining transfer to chair      ADL: ADL Overall ADL's : Needs assistance/impaired Eating/Feeding: Set up, Bed level Grooming: Set up, Supervision/safety, Wash/dry face, Sitting Upper Body Bathing: Minimal assistance, Sitting Lower Body Bathing: Maximal assistance, Sit to/from stand Upper Body Dressing : Minimal assistance, Sitting Lower Body Dressing: Maximal assistance, Sit to/from stand Lower Body Dressing Details (indicate  cue type and reason): Pt unable to sequence how to put sock on even with max cues Toilet Transfer: Minimal assistance, Stand-pivot, RW, Cueing for sequencing Toilet Transfer Details (indicate cue type and reason): Cues throughout, simulated by sit to stand from EOB with side stepping toward [facility] Functional mobility during ADLs: Minimal assistance, Rolling walker(for few steps only)  Cognition: Cognition Overall Cognitive Status: No family/caregiver present to determine baseline cognitive functioning Arousal/Alertness:  Awake/alert Orientation Level: Oriented X4 Attention: Sustained Sustained Attention: Impaired Sustained Attention Impairment: Verbal basic Memory: Impaired Memory Impairment: Storage deficit, Retrieval deficit Awareness: Impaired Awareness Impairment: Emergent impairment Problem Solving: Impaired Problem Solving Impairment: Verbal basic Executive Function: Reasoning Reasoning: Impaired Safety/Judgment: Impaired Cognition Arousal/Alertness: Awake/alert Behavior During Therapy: WFL for tasks assessed/performed Overall Cognitive Status: No family/caregiver present to determine baseline cognitive functioning Area of Impairment: Attention, Following commands, Safety/judgement, Awareness, Problem solving Current Attention Level: Sustained Following Commands: Follows one step commands inconsistently, Follows one step commands with increased time Safety/Judgement: Decreased awareness of safety, Decreased awareness of deficits Awareness: Intellectual Problem Solving: Slow processing, Decreased initiation, Difficulty sequencing, Requires verbal cues, Requires tactile cues General Comments: Tangential speech, difficulty sequencing how she was going to sit at EOB, difficulty figuring out how to put on a sock. Requires simple direct cues  Blood pressure (!) 157/83, pulse 80, temperature 98 F (36.7 C), temperature source Oral, resp. rate 18, height 5' (1.524 m), weight 61.9 kg (136 lb 7.4 oz), SpO2 99 %. Physical Exam  Vitals reviewed. Constitutional: She is oriented to person, place, and time. She appears well-developed and well-nourished.  HENT:  Head: Normocephalic and atraumatic.  Eyes: EOM are normal. Right eye exhibits no discharge. Left eye exhibits no discharge.  Neck: Normal range of motion. Neck supple. No thyromegaly present.  Cardiovascular: Normal rate and regular rhythm.  Respiratory: Effort normal and breath sounds normal. No respiratory distress.  GI: Soft. Bowel sounds are  normal. She exhibits no distension.  Musculoskeletal:  No edema or tenderness in extremities  Neurological: She is alert and oriented to person, place, and time.  Makes good eye contact with examiner.   Follows commands.   Fair awareness of deficits.   She does have some word finding difficulties. Dysarthria Confusion Motor: LUE: 4-/5 proximal to distal with apraxia RUE: 4/5 proximal to distal LLE: 3-/5 proximal to distal RLE: 3/5 proximal to distal  Skin: Skin is warm and dry.  Vascular changes b/l LE  Psychiatric: Her speech is delayed. She is slowed. Cognition and memory are impaired.    Results for orders placed or performed during the hospital encounter of 10/24/17 (from the past 24 hour(s))  CBC with Differential     Status: None   Collection Time: 10/24/17  1:41 PM  Result Value Ref Range   WBC 6.3 4.0 - 10.5 K/uL   RBC 4.63 3.87 - 5.11 MIL/uL   Hemoglobin 14.9 12.0 - 15.0 g/dL   HCT 45.3 36.0 - 46.0 %   MCV 97.8 78.0 - 100.0 fL   MCH 32.2 26.0 - 34.0 pg   MCHC 32.9 30.0 - 36.0 g/dL   RDW 13.9 11.5 - 15.5 %   Platelets 206 150 - 400 K/uL   Neutrophils Relative % 73 %   Neutro Abs 4.6 1.7 - 7.7 K/uL   Lymphocytes Relative 17 %   Lymphs Abs 1.1 0.7 - 4.0 K/uL   Monocytes Relative 8 %   Monocytes Absolute 0.5 0.1 - 1.0 K/uL   Eosinophils Relative 1 %  Eosinophils Absolute 0.0 0.0 - 0.7 K/uL   Basophils Relative 1 %   Basophils Absolute 0.0 0.0 - 0.1 K/uL   Immature Granulocytes 0 %   Abs Immature Granulocytes 0.0 0.0 - 0.1 K/uL  Basic metabolic panel     Status: Abnormal   Collection Time: 10/24/17  1:41 PM  Result Value Ref Range   Sodium 144 135 - 145 mmol/L   Potassium 3.7 3.5 - 5.1 mmol/L   Chloride 104 98 - 111 mmol/L   CO2 31 22 - 32 mmol/L   Glucose, Bld 102 (H) 70 - 99 mg/dL   BUN 11 8 - 23 mg/dL   Creatinine, Ser 1.00 0.44 - 1.00 mg/dL   Calcium 10.0 8.9 - 10.3 mg/dL   GFR calc non Af Amer 49 (L) >60 mL/min   GFR calc Af Amer 57 (L) >60 mL/min    Anion gap 9 5 - 15  Urinalysis, Routine w reflex microscopic     Status: None   Collection Time: 10/24/17  2:18 PM  Result Value Ref Range   Color, Urine YELLOW YELLOW   APPearance CLEAR CLEAR   Specific Gravity, Urine 1.005 1.005 - 1.030   pH 7.0 5.0 - 8.0   Glucose, UA NEGATIVE NEGATIVE mg/dL   Hgb urine dipstick NEGATIVE NEGATIVE   Bilirubin Urine NEGATIVE NEGATIVE   Ketones, ur NEGATIVE NEGATIVE mg/dL   Protein, ur NEGATIVE NEGATIVE mg/dL   Nitrite NEGATIVE NEGATIVE   Leukocytes, UA NEGATIVE NEGATIVE  TSH     Status: None   Collection Time: 10/24/17  6:54 PM  Result Value Ref Range   TSH 1.539 0.350 - 4.500 uIU/mL  Hemoglobin A1c     Status: None   Collection Time: 10/25/17  4:52 AM  Result Value Ref Range   Hgb A1c MFr Bld 5.3 4.8 - 5.6 %   Mean Plasma Glucose 105.41 mg/dL  Lipid panel     Status: None   Collection Time: 10/25/17  4:52 AM  Result Value Ref Range   Cholesterol 190 0 - 200 mg/dL   Triglycerides 115 <150 mg/dL   HDL 74 >40 mg/dL   Total CHOL/HDL Ratio 2.6 RATIO   VLDL 23 0 - 40 mg/dL   LDL Cholesterol 93 0 - 99 mg/dL   Ct Head Wo Contrast  Result Date: 10/24/2017 CLINICAL DATA:  84 year old female with acute confusion and aphasia. EXAM: CT HEAD WITHOUT CONTRAST TECHNIQUE: Contiguous axial images were obtained from the base of the skull through the vertex without intravenous contrast. COMPARISON:  02/13/2017 head CT and prior studies FINDINGS: Brain: An age indeterminate RIGHT thalamic infarct vs chronic ischemic changes now noted. No hemorrhage, midline shift, hydrocephalus, mass lesion or mass effect. Atrophy, chronic small-vessel white matter ischemic changes and remote cerebellar infarcts again noted. Vascular: Intracranial atherosclerotic calcifications again noted. Skull: Normal. Negative for fracture or focal lesion. Sinuses/Orbits: A small mucous retention cyst/polyp within the LEFT sphenoid sinus noted. Other: None IMPRESSION: Age indeterminate RIGHT  thalamic infarct vs chronic ischemic changes. Atrophy, chronic small-vessel white matter ischemic changes and remote cerebellar infarcts. Electronically Signed   By: Margarette Canada M.D.   On: 10/24/2017 15:50   Mr Brain Wo Contrast  Result Date: 10/24/2017 CLINICAL DATA:  84 y/o F; patient presenting with word-finding difficulty. History of stroke, TIA, hypertension, and atrial fibrillation. EXAM: MRI HEAD WITHOUT CONTRAST TECHNIQUE: Multiplanar, multiecho pulse sequences of the brain and surrounding structures were obtained without intravenous contrast. COMPARISON:  10/24/2017 CT head. 04/19/2015 MRI and  MRA of the head. FINDINGS: Brain: 3 mm focus of reduced diffusion within the right posterior parietal cortex (series 5, image 68) compatible with acute/early subacute infarction. No associated hemorrhage or mass effect. Several small chronic infarctions are present within the cerebellum bilaterally. Small chronic lacunar infarcts in the right thalamus, left lentiform nucleus, right caudate head. Small chronic cortical infarcts of the bilateral occipital lobes 6 and right frontal lobe. Numerous patchy nonspecific foci of T2 FLAIR hyperintense signal abnormality in subcortical and periventricular white matter are compatible with advanced chronic microvascular ischemic changes for age. Moderate brain parenchymal volume loss. No new susceptibility hypointensity to indicate interval intracranial hemorrhage. Vascular: Normal flow voids. Skull and upper cervical spine: Normal marrow signal. Sinuses/Orbits: Negative. Other: Left intra-ocular lens replacement. IMPRESSION: 1. 3 mm acute/early subacute infarction within the right posterior parietal cortex. No associated hemorrhage or mass effect. 2. Advanced chronic microvascular ischemic changes and moderate parenchymal volume loss of the brain. 3. Several small stable chronic infarctions within the cerebellum, basal ganglia, and right frontal cortex. These results will be  called to the ordering clinician or representative by the Radiologist Assistant, and communication documented in the PACS or zVision Dashboard. Electronically Signed   By: Kristine Garbe M.D.   On: 10/24/2017 23:53   Assessment/Plan: Diagnosis: Infarction within the right posterior parietal cortex.   Labs and images independently reviewed.  Records reviewed and summated above. Stroke: Continue secondary stroke prophylaxis and Risk Factor Modification listed below:   Antiplatelet therapy:   Blood Pressure Management:  Continue current medication with prn's with permisive HTN per primary team Statin Agent:   Left sided hemiparesis Motor recovery: Fluoxetine  1. Does the need for close, 24 hr/day medical supervision in concert with the patient's rehab needs make it unreasonable for this patient to be served in a less intensive setting? Yes  2. Co-Morbidities requiring supervision/potential complications: HTN (monitor and provide prns in accordance with increased physical exertion and pain), TIA, atrial fibrillation (cont meds, monitor HR with increased mobility) 3. Due to safety, disease management and patient education, does the patient require 24 hr/day rehab nursing? Yes 4. Does the patient require coordinated care of a physician, rehab nurse, PT (1-2 hrs/day, 5 days/week), OT (1-2 hrs/day, 5 days/week) and SLP (1-2 hrs/day, 5 days/week) to address physical and functional deficits in the context of the above medical diagnosis(es)? Yes Addressing deficits in the following areas: balance, endurance, locomotion, strength, transferring, bowel/bladder control, bathing, dressing, toileting, cognition, language and psychosocial support 5. Can the patient actively participate in an intensive therapy program of at least 3 hrs of therapy per day at least 5 days per week? Yes 6. The potential for patient to make measurable gains while on inpatient rehab is excellent 7. Anticipated functional  outcomes upon discharge from inpatient rehab are supervision and min assist  with PT, supervision and min assist with OT, modified independent with SLP. 8. Estimated rehab length of stay to reach the above functional goals is: 12-17 days. 9. Anticipated D/C setting: Home 10. Anticipated post D/C treatments: HH therapy and Home excercise program 11. Overall Rehab/Functional Prognosis: good  RECOMMENDATIONS: This patient's condition is appropriate for continued rehabilitative care in the following setting: Patient states she "loathes" therapy and does not want to be forced to exercise.  If patient changes her mind and consistent caregiver support available, recommned CIR, otherwise outpatient PM&R follow up. Patient has agreed to participate in recommended program. Potentially Note that insurance prior authorization may be required for reimbursement for  recommended care.  Comment: Rehab Admissions Coordinator to follow up.   I have personally performed a face to face diagnostic evaluation, including, but not limited to relevant history and physical exam findings, of this patient and developed relevant assessment and plan.  Additionally, I have reviewed and concur with the physician assistant's documentation above.   Delice Lesch, MD, ABPMR Lavon Paganini Angiulli, PA-C 10/25/2017

## 2017-10-26 MED ORDER — METOPROLOL SUCCINATE ER 25 MG PO TB24
50.0000 mg | ORAL_TABLET | Freq: Every day | ORAL | Status: DC
  Administered 2017-10-26 – 2017-10-28 (×3): 50 mg via ORAL
  Filled 2017-10-26 (×3): qty 2

## 2017-10-26 NOTE — Consult Note (Signed)
            [facility] CM Primary Care Navigator  10/26/2017  Jacqueline [name] 1928-08-27 [id number]   Went to see patient at the bedside to identify possible discharge needs. Patientreportshaving confusion/ unable to focus and speech difficulties thatresulted to this admission. (TIA/ CVA (cerebral vascular accident)  Patient endorses Dr.Hal Stoneking with [facility] astheprimary care provider.   [facility]using[employer] [place] obtain [place] without difficulty.  Patient states that her son Jacqueline [name]) has beenmanaging hermedications at homeusing "pill box" system filled every week.  Herdaughter Jacqueline [name]) has been providing transportation to herdoctors'appointments.  Patient reports living with other son Jacqueline [name]) who has been assisting with her care needs at home.  Anticipated discharge plan is skilled nursing facility (SNF) for rehabilitation as per Inpatient Rehab Admission note.   Patient voiced understanding to call primary care provider's officeif she returns backhome,for a post discharge follow-upvisitwithin1- 2weeksor sooner if needs arise.Patient letter (with PCP's contact number) was provided as a reminder.  Explained to patient about [facility] CM services available for healthmanagementand resourcesat home and she was interested about it. Patientverbalizedunderstanding to seekreferral from primary care provider to [facility] care management ifdeemed necessary and appropriatefor anyservicesin the nearfuture- once she returnsbackhome.   [facility] care management information provided for future needs thatshe may have.   Primary care provider's office is listed as providing transition of care (TOC) follow-up.    For additional questions please contact:  Edwena Felty A. Dellis Voght, BSN, RN-BC [facility] PRIMARY CARE Navigator Cell: [phone]

## 2017-10-26 NOTE — Progress Notes (Signed)
Progress Note    Jacqueline [name]  XTG:[id number] DOB: Aug 15, 1928  DOA: 10/24/2017 PCP: Lajean Manes, MD    Brief Narrative:    Medical records reviewed and are as summarized below:  Jacqueline [name] is an 84 y.o. female with medical history significant of HTN; CVA/TIA; GI bleeding; and afib on Eliquis presenting with speech problems.     Assessment/Plan:   Acute CVA -patient admitted with speech difficulties, posterior have resolved --MRI/MRA: 3 mm acute/early subacute infarction within the right posterior parietal cortex. No associated hemorrhage or mass effect. -neurology Dr.Sethi consulted, recommended to continue home regimen of Eliquis -Since she had a recent echocardiogram in February with EF of 55-60% and no cardiac source of embolus, a repeat echocardiogram now was not felt to be necessary -LDL 93, hemoglobin A1c was 5.3 -Pt/OT consulted, CIR recommended, at this time patient does not seem agreeable to CIR, updated son who will discuss this further with the patient and his brother and let us know today   HTN -continue diltiazem, restart Toprol  HLD -LDL: 93 -patient resistant to starting any new meds, declines statins  PAF -rate controlled, continue and Cardizem and restarted Toprol -Continue Eliquis   Diastolic CHF -Appears compensated at this time -Last echo was in 2/16 and showed preserved EF with grade 1 diastolic dysfunction   Family Communication/Anticipated D/C date and plan/Code Status   DVT prophylaxis: eliquis Code Status: DNR  Family Communication: none at bedside, called and discussed with son Disposition Plan: CIR  Vs home with home health services tomorrow   Medical Consultants:    Neuro  CIR    Subjective:   -feels okay overall, is anxious, wants to go home, does not want to go to rehabilitation  Objective:    Vitals:   10/25/17 2329 10/26/17 0323 10/26/17 0731 10/26/17 1137  BP: (!) 153/82 (!) 178/88 (!) 145/90 137/89  Pulse:  69 71 78 89  Resp: 18 20 20 20   Temp: 98 F (36.7 C) 97.7 F (36.5 C) 98.3 F (36.8 C) 98.6 F (37 C)  TempSrc: Oral Oral Oral Oral  SpO2: 96% 98% 95% 95%  Weight:      Height:        Intake/Output Summary (Last 24 hours) at 10/26/2017 1346 Last data filed at 10/26/2017 0900 Gross per 24 hour  Intake 320 ml  Output 1000 ml  Net -680 ml   Filed Weights   10/24/17 1336 10/24/17 1843  Weight: 72.6 kg (160 lb) 61.9 kg (136 lb 7.4 oz)    Exam: Gen: frail, thinly built anxious female, sitting up in bed, no distress HEENT: PERRLA, Neck supple, no JVD Lungs: Good air movement bilaterally, CTAB CVS: RRR,No Gallops,Rubs or new Murmurs Abd: soft, Non tender, non distended, BS present Extremities: No Cyanosis, Clubbing or edema Skin: no new rashes Neuro: motor 5 x 5, sensations light touch intact, cranial absolute 12 grossly intact, DTR 2+, plantars withdrawal  Data Reviewed:   I have personally reviewed following labs and imaging studies:  Labs: Labs show the following:   Basic Metabolic Panel: Recent Labs  Lab 10/24/17 1341  NA 144  K 3.7  CL 104  CO2 31  GLUCOSE 102*  BUN 11  CREATININE 1.00  CALCIUM 10.0   GFR Estimated Creatinine Clearance: 32.6 mL/min (by C-G formula based on SCr of 1 mg/dL). Liver Function Tests: No results for input(s): AST, ALT, ALKPHOS, BILITOT, PROT, ALBUMIN in the last 168 hours. No results for input(s): LIPASE, AMYLASE in  the last 168 hours. No results for input(s): AMMONIA in the last 168 hours. Coagulation profile No results for input(s): INR, PROTIME in the last 168 hours.  CBC: Recent Labs  Lab 10/24/17 1341  WBC 6.3  NEUTROABS 4.6  HGB 14.9  HCT 45.3  MCV 97.8  PLT 206   Cardiac Enzymes: No results for input(s): CKTOTAL, CKMB, CKMBINDEX, TROPONINI in the last 168 hours. BNP (last 3 results) No results for input(s): PROBNP in the last 8760 hours. CBG: No results for input(s): GLUCAP in the last 168  hours. D-Dimer: No results for input(s): DDIMER in the last 72 hours. Hgb A1c: Recent Labs    10/25/17 0452  HGBA1C 5.3   Lipid Profile: Recent Labs    10/25/17 0452  CHOL 190  HDL 74  LDLCALC 93  TRIG 115  CHOLHDL 2.6   Thyroid function studies: Recent Labs    10/24/17 1854  TSH 1.539   Anemia work up: No results for input(s): VITAMINB12, FOLATE, FERRITIN, TIBC, IRON, RETICCTPCT in the last 72 hours. Sepsis Labs: Recent Labs  Lab 10/24/17 1341  WBC 6.3    Microbiology No results found for this or any previous visit (from the past 240 hour(s)).  Procedures and diagnostic studies:  Ct Head Wo Contrast  Result Date: 10/24/2017 CLINICAL DATA:  84 year old female with acute confusion and aphasia. EXAM: CT HEAD WITHOUT CONTRAST TECHNIQUE: Contiguous axial images were obtained from the base of the skull through the vertex without intravenous contrast. COMPARISON:  02/13/2017 head CT and prior studies FINDINGS: Brain: An age indeterminate RIGHT thalamic infarct vs chronic ischemic changes now noted. No hemorrhage, midline shift, hydrocephalus, mass lesion or mass effect. Atrophy, chronic small-vessel white matter ischemic changes and remote cerebellar infarcts again noted. Vascular: Intracranial atherosclerotic calcifications again noted. Skull: Normal. Negative for fracture or focal lesion. Sinuses/Orbits: A small mucous retention cyst/polyp within the LEFT sphenoid sinus noted. Other: None IMPRESSION: Age indeterminate RIGHT thalamic infarct vs chronic ischemic changes. Atrophy, chronic small-vessel white matter ischemic changes and remote cerebellar infarcts. Electronically Signed   By: Margarette Canada M.D.   On: 10/24/2017 15:50   Mr Brain Wo Contrast  Result Date: 10/24/2017 CLINICAL DATA:  84 y/o F; patient presenting with word-finding difficulty. History of stroke, TIA, hypertension, and atrial fibrillation. EXAM: MRI HEAD WITHOUT CONTRAST TECHNIQUE: Multiplanar, multiecho  pulse sequences of the brain and surrounding structures were obtained without intravenous contrast. COMPARISON:  10/24/2017 CT head. 04/19/2015 MRI and MRA of the head. FINDINGS: Brain: 3 mm focus of reduced diffusion within the right posterior parietal cortex (series 5, image 68) compatible with acute/early subacute infarction. No associated hemorrhage or mass effect. Several small chronic infarctions are present within the cerebellum bilaterally. Small chronic lacunar infarcts in the right thalamus, left lentiform nucleus, right caudate head. Small chronic cortical infarcts of the bilateral occipital lobes 6 and right frontal lobe. Numerous patchy nonspecific foci of T2 FLAIR hyperintense signal abnormality in subcortical and periventricular white matter are compatible with advanced chronic microvascular ischemic changes for age. Moderate brain parenchymal volume loss. No new susceptibility hypointensity to indicate interval intracranial hemorrhage. Vascular: Normal flow voids. Skull and upper cervical spine: Normal marrow signal. Sinuses/Orbits: Negative. Other: Left intra-ocular lens replacement. IMPRESSION: 1. 3 mm acute/early subacute infarction within the right posterior parietal cortex. No associated hemorrhage or mass effect. 2. Advanced chronic microvascular ischemic changes and moderate parenchymal volume loss of the brain. 3. Several small stable chronic infarctions within the cerebellum, basal ganglia, and right frontal  cortex. These results will be called to the ordering clinician or representative by the Radiologist Assistant, and communication documented in the PACS or zVision Dashboard. Electronically Signed   By: Kristine Garbe M.D.   On: 10/24/2017 23:53    Medications:   .  stroke: mapping our early stages of recovery book   Does not apply Once  . ALPRAZolam  0.25 mg Oral Once  . apixaban  5 mg Oral BID  . atorvastatin  40 mg Oral q1800  . diltiazem  180 mg Oral BID  .  pantoprazole  40 mg Oral Daily  . polyethylene glycol  17 g Oral Daily   Continuous Infusions:   LOS: 1 day   [facility] Time spent: 72min  *Please refer to [email or web address], password [email or web address] to get updated schedule on who will round on this patient, as hospitalists switch teams weekly. If 7PM-7AM, please contact night-coverage at [email or web address], password [email or web address] for any overnight needs.  10/26/2017, 1:46 PM

## 2017-10-26 NOTE — Progress Notes (Addendum)
Inpatient Rehabilitation Admissions Coordinator  I met with patient at bedside. No family present. We began discussions concerning her rehab needs. She states she was at [facility] 3 years ago. She gave me permission to contact her son/POA, Freddy. I contacted him by phone and discussed the need for 24/7 caregiver support for pt after a short inpt rehab stay. He states that family obligations are such that they can not provide that level of support at home. He then prefers SNF level rehab. I have contacted Liz, SW and she will proceed with SNF placement. We will sign off at this time.   , RN, MSN Rehab Admissions Coordinator [phone] 10/26/2017 3:12 PM  

## 2017-10-27 NOTE — Progress Notes (Signed)
Physical Therapy Treatment Patient Details Name: Jacqueline [name] MRN: [id number] DOB: 09/01/1928 Today's Date: 10/27/2017    History of Present Illness Pt is an 84 y.o. female presenting with speech problems. MRI on 6/25 + for R parietal punctate infarct-acute/subacute. PMHx: HTN, TIA, GI bleeding, Afib.    PT Comments    Patient is oriented to person only and required max directional cues and assistance navigating environment. Patient required assistance for transfers and ambulating in room. Continue to progress as tolerated;   Follow Up Recommendations  CIR;Supervision/Assistance - 24 hour     Equipment Recommendations  (tbd)    Recommendations for Other Services Rehab consult     Precautions / Restrictions Precautions Precautions: Fall Restrictions Weight Bearing Restrictions: No    Mobility  Bed Mobility Overal bed mobility: Needs Assistance Bed Mobility: Sit to Supine     Supine to sit: Min assist Sit to supine: Min guard   General bed mobility comments: increased time and effort needed; min guard for safety and use of bed rails  Transfers Overall transfer level: Needs assistance Equipment used: Rolling walker (2 wheeled) Transfers: Sit to/from Stand Sit to Stand: Mod assist         General transfer comment: assistance required to power up from recliner and commode with use of grab bar; cues for hand placement  Ambulation/Gait Ambulation/Gait assistance: Mod assist Gait Distance (Feet): (48ft then 37ft) Assistive device: Rolling walker (2 wheeled) Gait Pattern/deviations: Decreased stride length;Shuffle;Drifts right/left;Trunk flexed;Step-through pattern Gait velocity: decreased   General Gait Details: pt unsteady and requires assistance for balance and for safe use of RW; Pt needs max cues for navigating environment and assistance to guide RW   Stairs             Wheelchair Mobility    Modified Rankin (Stroke Patients Only)       Balance  Overall balance assessment: Needs assistance;History of Falls Sitting-balance support: Feet supported Sitting balance-Leahy Scale: Fair Sitting balance - Comments: Cues for anterior lean with posterior bias noted   Standing balance support: Bilateral upper extremity supported Standing balance-Leahy Scale: Poor Standing balance comment: RW for support                            Cognition Arousal/Alertness: Awake/alert Behavior During Therapy: Restless;Anxious Overall Cognitive Status: No family/caregiver present to determine baseline cognitive functioning Area of Impairment: Attention;Following commands;Safety/judgement;Awareness;Problem solving;Memory;Orientation                 Orientation Level: Disoriented to;Place;Time;Situation Current Attention Level: Focused Memory: Decreased short-term memory Following Commands: Follows one step commands inconsistently;Follows one step commands with increased time Safety/Judgement: Decreased awareness of safety;Decreased awareness of deficits Awareness: Intellectual Problem Solving: Slow processing;Decreased initiation;Difficulty sequencing;Requires verbal cues;Requires tactile cues General Comments: tangential speech during session and needing maximal cues for redirection.       Exercises      General Comments        Pertinent Vitals/Pain Pain Assessment: Faces Faces Pain Scale: Hurts little more Pain Descriptors / Indicators: Headache Pain Intervention(s): Limited activity within patient's tolerance;Monitored during session;Repositioned;Patient requesting pain meds-RN notified    Home Living                      Prior Function            PT Goals (current goals can now be found in the care plan section) Acute Rehab PT Goals Patient Stated Goal: to  go home PT Goal Formulation: With patient Time For Goal Achievement: 11/08/17 Potential to Achieve Goals: Fair Progress towards PT goals: Progressing  toward goals    Frequency    Min 3X/week      PT Plan Current plan remains appropriate    Co-evaluation              AM-PAC PT "6 Clicks" Daily Activity  Outcome Measure  Difficulty turning over in bed (including adjusting bedclothes, sheets and blankets)?: A Little Difficulty moving from lying on back to sitting on the side of the bed? : Unable Difficulty sitting down on and standing up from a chair with arms (e.g., wheelchair, bedside commode, etc,.)?: Unable Help needed moving to and from a bed to chair (including a wheelchair)?: A Little Help needed walking in hospital room?: A Lot Help needed climbing 3-5 steps with a railing? : A Lot 6 Click Score: 12    End of Session Equipment Utilized During Treatment: Gait belt Activity Tolerance: Patient limited by fatigue Patient left: in bed;with call bell/phone within reach;with bed alarm set Nurse Communication: Mobility status PT Visit Diagnosis: Unsteadiness on feet (R26.81);Difficulty in walking, not elsewhere classified (R26.2);Other symptoms and signs involving the nervous system (R29.898)     Time: 1224-4975 PT Time Calculation (min) (ACUTE ONLY): 26 min  Charges:  $Gait Training: 8-22 mins $Therapeutic Activity: 8-22 mins                    G Codes:       Earney Navy, PTA Pager: [phone]     Darliss Cheney 10/27/2017, 3:09 PM

## 2017-10-27 NOTE — Progress Notes (Signed)
Occupational Therapy Treatment Patient Details Name: Jacqueline [name] MRN: [id number] DOB: 07/05/1928 Today's Date: 10/27/2017    History of present illness Pt is an 84 y.o. female presenting with speech problems. MRI on 6/25 + for R parietal punctate infarct-acute/subacute. PMHx: HTN, TIA, GI bleeding, Afib.   OT comments  Pt making progress towards goals. Pt continues to have tangential speech throughout session and needing max cuing to redirect for functional tasks. Pt oriented to place, time,self, and situation. Pt standing with posterior bias and needing min multimodal cues for anterior weight shift. Pt needing encouragement to transfer to recliner chair. Pt ambulating 5' from bed >recliner chair with RW and steady assistance for balance. Pt needing cues for forward gave and sequencing as well as advancement of RW. Pt will continue to benefit from OT intervention.   Follow Up Recommendations  Supervision/Assistance - 24 hour;SNF    Equipment Recommendations  Other (comment)(defer to next venue of care)    Recommendations for Other Services      Precautions / Restrictions Precautions Precautions: Fall Restrictions Weight Bearing Restrictions: No       Mobility Bed Mobility Overal bed mobility: Needs Assistance Bed Mobility: Supine to Sit;Sit to Supine     Supine to sit: Min assist     General bed mobility comments: min A  to elevate trunk from flat bed  Transfers Overall transfer level: Needs assistance Equipment used: Rolling walker (2 wheeled) Transfers: Sit to/from Stand Sit to Stand: Mod assist         General transfer comment: mod lifting assistance needed for standing from EOB    Balance Overall balance assessment: Needs assistance;History of Falls Sitting-balance support: Feet supported Sitting balance-Leahy Scale: Fair Sitting balance - Comments: Cues for anterior lean with posterior bias noted   Standing balance support: Bilateral upper extremity  supported Standing balance-Leahy Scale: Poor Standing balance comment: RW for support          ADL either performed or assessed with clinical judgement   ADL Overall ADL's : Needs assistance/impaired       Lower Body Dressing: Total assistance Lower Body Dressing Details (indicate cue type and reason): unable to attend to task and sequence donning socks                    Cognition Arousal/Alertness: Awake/alert Behavior During Therapy: Restless Overall Cognitive Status: No family/caregiver present to determine baseline cognitive functioning Area of Impairment: Attention;Following commands;Safety/judgement;Awareness;Problem solving   Current Attention Level: Focused   Following Commands: Follows one step commands inconsistently;Follows one step commands with increased time Safety/Judgement: Decreased awareness of safety;Decreased awareness of deficits Awareness: Intellectual Problem Solving: Slow processing;Decreased initiation;Difficulty sequencing;Requires verbal cues;Requires tactile cues General Comments: tangential speech during session and needing maximal cues for redirection.                    Pertinent Vitals/ Pain       Pain Assessment: Faces Faces Pain Scale: No hurt         Frequency  Min 2X/week        Progress Toward Goals  OT Goals(current goals can now be found in the care plan section)  Progress towards OT goals: Progressing toward goals  Acute Rehab OT Goals Patient Stated Goal: to go home OT Goal Formulation: With patient Time For Goal Achievement: 11/10/17 Potential to Achieve Goals: Good  Plan Discharge plan needs to be updated       AM-PAC PT "6 Clicks" Daily Activity  Outcome Measure   Help from another person eating meals?: A Little Help from another person taking care of personal grooming?: A Little Help from another person toileting, which includes using toliet, bedpan, or urinal?: A Lot Help from another person  bathing (including washing, rinsing, drying)?: A Lot Help from another person to put on and taking off regular upper body clothing?: A Little Help from another person to put on and taking off regular lower body clothing?: A Lot 6 Click Score: 15    End of Session Equipment Utilized During Treatment: Rolling walker  OT Visit Diagnosis: Unsteadiness on feet (R26.81);Repeated falls (R29.6);Muscle weakness (generalized) (M62.81);Cognitive communication deficit (R41.841) Symptoms and signs involving cognitive functions: Cerebral infarction   Activity Tolerance Patient tolerated treatment well   Patient Left with call bell/phone within reach;in chair;with chair alarm set   Nurse Communication Mobility status;Other (comment)(notfied chair alarm on )        Time: 6269-4854 OT Time Calculation (min): 23 min  Charges: OT General Charges $OT Visit: 1 Visit OT Treatments $Therapeutic Activity: 23-37 mins    Jacqueline [name] P , MS, OTR/L, CBIS 10/27/2017, 11:34 AM

## 2017-10-27 NOTE — Clinical Social Work Note (Signed)
Clinical Social Work Assessment  Patient Details  Name: Jacqueline [name] MRN: [id number] Date of Birth: 1928/10/25  Date of referral:  10/27/17               Reason for consult:  Facility Placement                Permission sought to share information with:  Facility Sport and exercise psychologist, Family Supports Permission granted to share information::  Yes, Verbal Permission Granted  Name::     Herbalist::  SNF  Relationship::  Son  Contact Information:     Housing/Transportation Living arrangements for the past 2 months:  Single Family Home Source of Information:  Patient, Adult Children Patient Interpreter Needed:  None Criminal Activity/Legal Involvement Pertinent to Current Situation/Hospitalization:  No - Comment as needed Significant Relationships:  Adult Children Lives with:  Self, Adult Children Do you feel safe going back to the place where you live?  Yes Need for family participation in patient care:  No (Coment)  Care giving concerns:  Patient from home with son, but will need short term rehab at discharge prior to returning home.   Social Worker assessment / plan:  CSW alerted by rehab admissions that patient's family would like to pursue SNF and would like [facility]. CSW spoke with patient's son over the phone to discuss bed offers and confirm preference for [facility]. CSW to continue to follow.  Employment status:  Retired Forensic scientist:  Medicare PT Recommendations:  Inpatient [place] / Referral to community resources:  [place]  Patient/Family's Response to care:  Patient and family agreeable to SNF placement.  Patient/Family's Understanding of and Emotional Response to Diagnosis, Current Treatment, and Prognosis:  Patient and family are aware that the patient needs additional assistance at this time and will benefit from a period of rehabilitation prior to returning home. Patient prefers [facility] as she has been there  before and is familiar with it.  Emotional Assessment Appearance:  Appears stated age Attitude/Demeanor/Rapport:  Engaged Affect (typically observed):  Pleasant Orientation:  Oriented to Self, Oriented to Place, Oriented to  Time, Oriented to Situation Alcohol / Substance use:  Not Applicable Psych involvement (Current and /or in the community):  No (Comment)  Discharge Needs  Concerns to be addressed:  Care Coordination Readmission within the last 30 days:  No Current discharge risk:  Physical Impairment, Dependent with Mobility Barriers to Discharge:  Continued Medical Work up, [place], [place] 10/27/2017, 4:17 PM

## 2017-10-27 NOTE — Progress Notes (Signed)
Progress Note    Jacqueline [name]  PJS:[id number] DOB: Apr 08, 1929  DOA: 10/24/2017 PCP: Lajean Manes, MD    Brief Narrative:    Medical records reviewed and are as summarized below:  Jacqueline [name] is an 84 y.o. female with medical history significant of HTN; CVA/TIA; GI bleeding; and afib on Eliquis presenting with speech problems.  -MRI positive for small infarct  Assessment/Plan:   Acute CVA -patient admitted with speech difficulties, have resolved --MRI/MRA: 3 mm acute/early subacute infarction within the right posterior parietal cortex. No associated hemorrhage or mass effect. -neurology Dr.Sethi consulted, recommended to continue home regimen of Eliquis -Since she had a recent echocardiogram in February with EF of 55-60% and no cardiac source of embolus, a repeat echocardiogram now was not felt to be necessary -LDL 93, hemoglobin A1c was 5.3 -Pt/OT consulted, now plan for SNF tomorrow -remained stable, no active issues except for chronic anxiety  HTN -continue diltiazem,  Toprol  HLD -LDL: 93 -patient resistant to starting any new meds, declines statins  PAF -continue and Cardizem and restarted Toprol -Continue Eliquis  -heart rate improved  Diastolic CHF -Appears compensated at this time -Last echo was in 2/16 and showed preserved EF with grade 1 diastolic dysfunction   Family Communication/Anticipated D/C date and plan/Code Status   DVT prophylaxis: eliquis Code Status: DNR  Family Communication: none at bedside, called and discussed with son Disposition Plan: to SNF tomorrow   Medical Consultants:    Neuro  CIR    Subjective:   -feels okay overall, is anxious, wants to go home, does not want to go to rehabilitation  Objective:    Vitals:   10/26/17 2355 10/27/17 0347 10/27/17 0818 10/27/17 1230  BP: (!) 148/81 (!) 151/88 (!) 150/87 (!) 148/90  Pulse: (!) 59 68 79 78  Resp: 15 16 16 16   Temp:    98.1 F (36.7 C)  TempSrc:    Oral  SpO2:  95% (!) 89% 95% 96%  Weight:      Height:        Intake/Output Summary (Last 24 hours) at 10/27/2017 1444 Last data filed at 10/26/2017 1759 Gross per 24 hour  Intake -  Output 1000 ml  Net -1000 ml   Filed Weights   10/24/17 1336 10/24/17 1843  Weight: 72.6 kg (160 lb) 61.9 kg (136 lb 7.4 oz)    Exam: Gen: frail, thinly built anxious female, sitting up in bed, no distress HEENT: PERRLA, Neck supple, no JVD Lungs: Good air movement bilaterally, CTAB CVS: RRR,No Gallops,Rubs or new Murmurs Abd: soft, Non tender, non distended, BS present Extremities: No Cyanosis, Clubbing or edema Skin: no new rashes Neuro: motor 5 x 5, sensations light touch intact, cranial absolute 12 grossly intact, DTR 2+, plantars withdrawal  Data Reviewed:   I have personally reviewed following labs and imaging studies:  Labs: Labs show the following:   Basic Metabolic Panel: Recent Labs  Lab 10/24/17 1341  NA 144  K 3.7  CL 104  CO2 31  GLUCOSE 102*  BUN 11  CREATININE 1.00  CALCIUM 10.0   GFR Estimated Creatinine Clearance: 32.6 mL/min (by C-G formula based on SCr of 1 mg/dL). Liver Function Tests: No results for input(s): AST, ALT, ALKPHOS, BILITOT, PROT, ALBUMIN in the last 168 hours. No results for input(s): LIPASE, AMYLASE in the last 168 hours. No results for input(s): AMMONIA in the last 168 hours. Coagulation profile No results for input(s): INR, PROTIME in the last 168 hours.  CBC: Recent Labs  Lab 10/24/17 1341  WBC 6.3  NEUTROABS 4.6  HGB 14.9  HCT 45.3  MCV 97.8  PLT 206   Cardiac Enzymes: No results for input(s): CKTOTAL, CKMB, CKMBINDEX, TROPONINI in the last 168 hours. BNP (last 3 results) No results for input(s): PROBNP in the last 8760 hours. CBG: No results for input(s): GLUCAP in the last 168 hours. D-Dimer: No results for input(s): DDIMER in the last 72 hours. Hgb A1c: Recent Labs    10/25/17 0452  HGBA1C 5.3   Lipid Profile: Recent Labs     10/25/17 0452  CHOL 190  HDL 74  LDLCALC 93  TRIG 115  CHOLHDL 2.6   Thyroid function studies: Recent Labs    10/24/17 1854  TSH 1.539   Anemia work up: No results for input(s): VITAMINB12, FOLATE, FERRITIN, TIBC, IRON, RETICCTPCT in the last 72 hours. Sepsis Labs: Recent Labs  Lab 10/24/17 1341  WBC 6.3    Microbiology No results found for this or any previous visit (from the past 240 hour(s)).  Procedures and diagnostic studies:  No results found.  Medications:   .  stroke: mapping our early stages of recovery book   Does not apply Once  . ALPRAZolam  0.25 mg Oral Once  . apixaban  5 mg Oral BID  . atorvastatin  40 mg Oral q1800  . diltiazem  180 mg Oral BID  . metoprolol succinate  50 mg Oral Daily  . pantoprazole  40 mg Oral Daily  . polyethylene glycol  17 g Oral Daily   Continuous Infusions:   LOS: 2 days   Domenic Polite  [facility] Time spent: 67min  *Please refer to [email or web address], password [email or web address] to get updated schedule on who will round on this patient, as hospitalists switch teams weekly. If 7PM-7AM, please contact night-coverage at [email or web address], password [email or web address] for any overnight needs.  10/27/2017, 2:44 PM

## 2017-10-27 NOTE — Plan of Care (Signed)
Patient stable, discussed POC with patient, denies question/concerns at this time.  

## 2017-10-27 NOTE — NC FL2 (Signed)
[place] MEDICAID FL2 LEVEL OF CARE SCREENING TOOL     IDENTIFICATION  Patient Name: Jacqueline [name] Birthdate: 07-06-1928 Sex: female Admission Date (Current Location): 10/24/2017  [facility] [place] Number:  Herbalist and Address:  [facility] [place]. [facility], [place] [address] [place], [place] [address]      Provider Number: [id number]  Attending Physician Name and Address:  Domenic Polite, MD  Relative Name and Phone Number:       Current Level of Care: Hospital Recommended Level of Care: [place] Prior Approval Number:    Date Approved/Denied:   PASRR Number: [id number]  Discharge Plan: SNF    Current Diagnoses: Patient Active Problem List   Diagnosis Date Noted  . CVA (cerebral vascular accident) ([place]) 10/25/2017  . Benign essential HTN   . History of TIA (transient ischemic attack)   . TIA (transient ischemic attack) 10/24/2017  . Cerebral infarction due to embolism of right middle cerebral artery ([place])   . HLD (hyperlipidemia)   . Acute ischemic right MCA stroke ([place]) 06/18/2014  . Facial droop   . Displaced fracture of shaft of left humerus 04/03/2014  . Right arm fracture 04/01/2014  . Displaced spiral fracture of shaft of humerus   . Peripheral arterial disease ([place]) 10/03/2013  . Chronic diastolic CHF (congestive heart failure) ([place]) 08/08/2013  . Chronic venous insufficiency 04/02/2013  . Hypokalemia 01/13/2013  . PAF (paroxysmal atrial fibrillation) ([place]) 10/03/2012  . CAD (coronary artery disease) of artery bypass graft 04/05/2012  . Atrial fibrillation with RVR ([place]) 03/13/2012  . Need for prophylactic vaccination and inoculation against influenza 02/14/2012  . ABDOMINAL WALL HERNIA 01/21/2010  . PARESTHESIA 05/04/2009  . SKIN LESION 01/28/2009  . Nocturia 01/28/2009  . DYSPNEA 01/01/2009  . CVA (cerebral infarction) 11/17/2008  . ALLERGY 10/27/2008  . PAROXYSMAL NOCTURNAL DYSPNEA 10/03/2008  . Osteoarthrosis,  unspecified whether generalized or localized, involving lower leg 09/03/2008  . RESTRICTIVE LUNG DISEASE 05/26/2008  . Anemia 01/08/2008  . UNS ADVRS EFF OTH RX MEDICINAL&BIOLOGICAL SBSTNC 11/09/2007  . HEMATOCHEZIA 07/18/2007  . PARANOIA 06/01/2007  . Anxiety state 01/23/2007  . Hypertensive heart disease with CHF ([place]) 01/23/2007  . GI BLEEDING 01/23/2007  . Osteoarthritis 01/23/2007  . DEGENERATIVE DISC DISEASE, LUMBAR SPINE 01/23/2007  . History of peptic ulcer disease 01/23/2007  . Personal history of other diseases of digestive system 01/23/2007    Orientation RESPIRATION BLADDER Height & Weight     Time, Situation, Place, Self  Normal Continent Weight: 136 lb 7.4 oz (61.9 kg) Height:  5' (152.4 cm)  BEHAVIORAL SYMPTOMS/MOOD NEUROLOGICAL BOWEL NUTRITION STATUS      Continent Diet(heart healthy)  AMBULATORY STATUS COMMUNICATION OF NEEDS Skin   Limited Assist Verbally Normal                       Personal Care Assistance Level of Assistance  Bathing, Feeding, Dressing Bathing Assistance: Limited assistance Feeding assistance: Independent Dressing Assistance: Limited assistance     Functional Limitations Info  Sight, Hearing, Speech Sight Info: Adequate Hearing Info: Adequate Speech Info: Adequate    SPECIAL CARE FACTORS FREQUENCY  PT (By licensed PT), OT (By licensed OT)     PT Frequency: 5x/wk OT Frequency: 5x/wk            Contractures Contractures Info: Not present    Additional Factors Info  Code Status, Allergies Code Status Info: DNR Allergies Info: Metoclopramide Hcl, Penicillins, Aspirin, Zetia Ezetimibe  Current Medications (10/27/2017):  This is the current hospital active medication list Current Facility-Administered Medications  Medication Dose Route Frequency Provider Last Rate Last Dose  .  stroke: mapping our early stages of recovery book   Does not apply Once Karmen Bongo, MD   Stopped at 10/24/17 2216  .  acetaminophen (TYLENOL) tablet 650 mg  650 mg Oral Q4H PRN Karmen Bongo, MD   650 mg at 10/26/17 1407   Or  . acetaminophen (TYLENOL) solution 650 mg  650 mg Per Tube Q4H PRN Karmen Bongo, MD       Or  . acetaminophen (TYLENOL) suppository 650 mg  650 mg Rectal Q4H PRN Karmen Bongo, MD      . ALPRAZolam Duanne Moron) tablet 0.125 mg  0.125 mg Oral Q6H PRN Karmen Bongo, MD   0.125 mg at 10/27/17 1011  . ALPRAZolam Duanne Moron) tablet 0.25 mg  0.25 mg Oral Once Amie [place], MD      . apixaban Arne Cleveland) tablet 5 mg  5 mg Oral BID Donzetta Starch, NP   5 mg at 10/27/17 1010  . atorvastatin (LIPITOR) tablet 40 mg  40 mg Oral q1800 Donzetta Starch, NP      . diltiazem (CARDIZEM CD) 24 hr capsule 180 mg  180 mg Oral BID Vann, Jessica U, DO   180 mg at 10/27/17 1010  . HYDROcodone-acetaminophen (NORCO/VICODIN) 5-325 MG per tablet 1 tablet  1 tablet Oral Q6H PRN Karmen Bongo, MD   1 tablet at 10/27/17 0726  . metoprolol succinate (TOPROL-XL) 24 hr tablet 50 mg  50 mg Oral Daily Domenic Polite, MD   50 mg at 10/27/17 1010  . pantoprazole (PROTONIX) EC tablet 40 mg  40 mg Oral Daily Karmen Bongo, MD   40 mg at 10/27/17 1010  . polyethylene glycol (MIRALAX / GLYCOLAX) packet 17 g  17 g Oral Daily Karmen Bongo, MD   17 g at 10/27/17 1010  . senna-docusate (Senokot-S) tablet 1 tablet  1 tablet Oral QHS PRN Karmen Bongo, MD   1 tablet at 10/26/17 1740     Discharge Medications: Please see discharge summary for a list of discharge medications.  Relevant Imaging Results:  Relevant Lab Results:   Additional Information SS#: [id number]  Geralynn Ochs, LCSW

## 2017-10-27 NOTE — Care Management Important Message (Signed)
Important Message  Patient Details  Name: Jacqueline [name] MRN: [id number] Date of Birth: 02/23/1929   Medicare Important Message Given:  Yes    Ieshia Hatcher 10/27/2017, 1:52 PM

## 2017-10-28 DIAGNOSIS — Z8673 Personal history of transient ischemic attack (TIA), and cerebral infarction without residual deficits: Secondary | ICD-10-CM | POA: Diagnosis not present

## 2017-10-28 DIAGNOSIS — R4701 Aphasia: Secondary | ICD-10-CM | POA: Diagnosis not present

## 2017-10-28 DIAGNOSIS — F4320 Adjustment disorder, unspecified: Secondary | ICD-10-CM | POA: Diagnosis not present

## 2017-10-28 DIAGNOSIS — R402441 Other coma, without documented Glasgow coma scale score, or with partial score reported, in the field [EMT or ambulance]: Secondary | ICD-10-CM | POA: Diagnosis not present

## 2017-10-28 DIAGNOSIS — I639 Cerebral infarction, unspecified: Secondary | ICD-10-CM | POA: Diagnosis not present

## 2017-10-28 DIAGNOSIS — I110 Hypertensive heart disease with heart failure: Secondary | ICD-10-CM | POA: Diagnosis not present

## 2017-10-28 DIAGNOSIS — R410 Disorientation, unspecified: Secondary | ICD-10-CM | POA: Diagnosis not present

## 2017-10-28 DIAGNOSIS — R1312 Dysphagia, oropharyngeal phase: Secondary | ICD-10-CM | POA: Diagnosis not present

## 2017-10-28 DIAGNOSIS — I2510 Atherosclerotic heart disease of native coronary artery without angina pectoris: Secondary | ICD-10-CM | POA: Diagnosis not present

## 2017-10-28 DIAGNOSIS — Z79899 Other long term (current) drug therapy: Secondary | ICD-10-CM | POA: Diagnosis not present

## 2017-10-28 DIAGNOSIS — R4182 Altered mental status, unspecified: Secondary | ICD-10-CM | POA: Diagnosis not present

## 2017-10-28 DIAGNOSIS — R2689 Other abnormalities of gait and mobility: Secondary | ICD-10-CM | POA: Diagnosis not present

## 2017-10-28 DIAGNOSIS — Z87891 Personal history of nicotine dependence: Secondary | ICD-10-CM | POA: Diagnosis not present

## 2017-10-28 DIAGNOSIS — I10 Essential (primary) hypertension: Secondary | ICD-10-CM | POA: Diagnosis not present

## 2017-10-28 DIAGNOSIS — I63411 Cerebral infarction due to embolism of right middle cerebral artery: Secondary | ICD-10-CM | POA: Diagnosis not present

## 2017-10-28 DIAGNOSIS — E785 Hyperlipidemia, unspecified: Secondary | ICD-10-CM | POA: Diagnosis not present

## 2017-10-28 DIAGNOSIS — Z743 Need for continuous supervision: Secondary | ICD-10-CM | POA: Diagnosis not present

## 2017-10-28 DIAGNOSIS — I69328 Other speech and language deficits following cerebral infarction: Secondary | ICD-10-CM | POA: Diagnosis not present

## 2017-10-28 DIAGNOSIS — I5032 Chronic diastolic (congestive) heart failure: Secondary | ICD-10-CM | POA: Diagnosis not present

## 2017-10-28 DIAGNOSIS — I447 Left bundle-branch block, unspecified: Secondary | ICD-10-CM | POA: Diagnosis not present

## 2017-10-28 DIAGNOSIS — R4020 Unspecified coma: Secondary | ICD-10-CM | POA: Diagnosis not present

## 2017-10-28 DIAGNOSIS — R41841 Cognitive communication deficit: Secondary | ICD-10-CM | POA: Diagnosis not present

## 2017-10-28 DIAGNOSIS — I480 Paroxysmal atrial fibrillation: Secondary | ICD-10-CM | POA: Diagnosis not present

## 2017-10-28 DIAGNOSIS — M6281 Muscle weakness (generalized): Secondary | ICD-10-CM | POA: Diagnosis not present

## 2017-10-28 DIAGNOSIS — Z9181 History of falling: Secondary | ICD-10-CM | POA: Diagnosis not present

## 2017-10-28 DIAGNOSIS — Z7901 Long term (current) use of anticoagulants: Secondary | ICD-10-CM | POA: Diagnosis not present

## 2017-10-28 DIAGNOSIS — R279 Unspecified lack of coordination: Secondary | ICD-10-CM | POA: Diagnosis not present

## 2017-10-28 DIAGNOSIS — K219 Gastro-esophageal reflux disease without esophagitis: Secondary | ICD-10-CM | POA: Diagnosis not present

## 2017-10-28 DIAGNOSIS — F419 Anxiety disorder, unspecified: Secondary | ICD-10-CM | POA: Diagnosis not present

## 2017-10-28 MED ORDER — ALPRAZOLAM 0.25 MG PO TABS: 0.1250 mg | ORAL_TABLET | Freq: Two times a day (BID) | ORAL | 0 refills | Status: DC | PRN

## 2017-10-28 MED ORDER — ATORVASTATIN CALCIUM 40 MG PO TABS: 40.0000 mg | ORAL_TABLET | Freq: Every day | ORAL | 0 refills | Status: DC

## 2017-10-28 NOTE — Plan of Care (Signed)
  Problem: Education: Goal: Knowledge of General Education information will improve Outcome: Progressing   Problem: Activity: Goal: Risk for activity intolerance will decrease Outcome: Progressing   Problem: Nutrition: Goal: Adequate nutrition will be maintained Outcome: Progressing   Problem: Coping: Goal: Level of anxiety will decrease Outcome: Progressing   

## 2017-10-28 NOTE — Clinical Social Work Note (Signed)
Clinical Social Worker facilitated patient discharge including contacting patient family and facility to confirm patient discharge plans.  Clinical information faxed to facility and family agreeable with plan.  CSW arranged ambulance transport via [facility] to [place] to call [phone]  for report prior to discharge.  Clinical Social Worker will sign off for now as social work intervention is no longer needed. Please consult [place] again if new need arises.  [place], [place]

## 2017-10-28 NOTE — Clinical Social Work Placement (Signed)
   CLINICAL SOCIAL WORK PLACEMENT  NOTE  Date:  10/28/2017  Patient Details  Name: Jacqueline [name] MRN: [id number] Date of Birth: [birth year]  Clinical Social Work is seeking post-discharge placement for this patient at the [place] level of care (*CSW will initial, date and re-position this form in  chart as items are completed):      Patient/family provided with [place] Work Department's list of facilities offering this level of care within the geographic area requested by the patient (or if unable, by the patient's family).  Yes   Patient/family informed of their freedom to choose among providers that offer the needed level of care, that participate in Medicare, Medicaid or managed care program needed by the patient, have an available bed and are willing to accept the patient.      Patient/family informed of [place]'s ownership interest in [facility] and [facility], as well as of the fact that they are under no obligation to receive care at these facilities.  PASRR submitted to EDS on       PASRR number received on 10/27/17     Existing PASRR number confirmed on       FL2 transmitted to all facilities in geographic area requested by pt/family on 10/27/17     FL2 transmitted to all facilities within larger geographic area on       Patient informed that his/her managed care company has contracts with or will negotiate with certain facilities, including the following:        Yes   Patient/family informed of bed offers received.  Patient chooses bed at [facility]     Physician recommends and patient chooses bed at      Patient to be transferred to [facility] on 10/28/17.  Patient to be transferred to facility by [facility]     Patient family notified on 10/28/17 of transfer.  Name of family member notified:  [name], Son     PHYSICIAN Please prepare priority discharge summary, including medications,  Please prepare prescriptions, Please sign DNR     Additional Comment:    _______________________________________________ Eileen Stanford, LCSW 10/28/2017, 10:47 AM

## 2017-10-28 NOTE — Discharge Summary (Signed)
Physician Discharge Summary  Jacqueline [name] OZH:[id number] DOB: Dec 14, 1928 DOA: 10/24/2017  PCP: Lajean Manes, MD  Admit date: 10/24/2017 Discharge date: 10/28/2017  Time spent: 45 minutes  Recommendations for Outpatient Follow-up:  1. PCP in 1 week 2. [facility] in 70month   Discharge Diagnoses:    CVA   PAF (paroxysmal atrial fibrillation) (HCC)   Chronic diastolic CHF (congestive heart failure) ([place])   HLD (hyperlipidemia)   CVA (cerebral vascular accident) ([place])   Benign essential HTN   History of TIA (transient ischemic attack)   Memory loss/cognitive deficits  Discharge Condition: stable  Diet recommendation: heart healthy low sodium  Filed Weights   10/24/17 1336 10/24/17 1843  Weight: 72.6 kg (160 lb) 61.9 kg (136 lb 7.4 oz)    History of present illness:  Jacqueline [name] is an 84 y.o. female with medical history significant ofHTN; CVA/TIA; GI bleeding; and afib on Eliquis presented to the ED with speech problems.  Hospital Course:   Acute CVA -patient admitted with speech difficulties, which have resolved --MRI/MRA: 3 mm acute/early subacute infarction within the right posterior parietal cortex -neurology Dr.Sethi consulted, recommended to continue home regimen of Eliquis -Since she had a recent echocardiogram in February with EF of 55-60% and no cardiac source of embolus, a repeat echocardiogram now was not felt to be necessary -LDL 93, hemoglobin A1c was 5.3 -Pt/OT consulted, now plan for SNF today for Rehab -remained stable, no active issues except for chronic anxiety  HTN -continue diltiazem,  Toprol  HLD -LDL: 93 -started low dose atorvastatin  PAF -continue and Cardizem and restarted Toprol -Continue Eliquis  -heart rate improved  Diastolic CHF -Appears compensated at this time -Last echo was in 2/16 and showed preserved EF with grade 1 diastolic dysfunction  Anxiety -continue home regimen of xanax   Code Status: DNR       Consultations:  Neurology  Discharge Exam: Vitals:   10/28/17 0342 10/28/17 0819  BP: (!) 155/69 (!) 148/81  Pulse: 71 77  Resp: 18 16  Temp: 98.1 F (36.7 C) 97.6 F (36.4 C)  SpO2: 98% 97%    General: AAOx2 Cardiovascular: S1S2/RRR Respiratory: CTAB  Discharge Instructions   Discharge Instructions    Ambulatory referral to Neurology   Complete by:  As directed    Follow up with stroke clinic NP (Jessica Vanschaick or Cecille Rubin, if both not available, consider Dr. Antony Contras, Dr. Bess Harvest, or Dr. Sarina Ill) at [facility] in about 4 weeks, following discharge from inpatient rehab.   Ambulatory referral to Physical Medicine Rehab   Complete by:  As directed    1 month outpt stroke follow up   Diet - low sodium heart healthy   Complete by:  As directed    Increase activity slowly   Complete by:  As directed      Allergies as of 10/28/2017      Reactions   Metoclopramide Hcl Other (See Comments)    Tremors   Penicillins Itching   Has patient had a PCN reaction causing immediate rash, facial/tongue/throat swelling, SOB or lightheadedness with hypotension: Yes Has patient had a PCN reaction causing severe rash involving mucus membranes or skin necrosis: Yes Has patient had a PCN reaction that required hospitalization:No Has patient had a PCN reaction occurring within the last 10 years: No If all of the above answers are "NO", then may proceed with Cephalosporin use.   Aspirin Other (See Comments)   "gave me stomach aches; made me  bleed" (03/13/12)   Zetia [ezetimibe] Other (See Comments)   Stomach and back pain      Medication List    TAKE these medications   ALPRAZolam 0.25 MG tablet Commonly known as:  XANAX Take 0.5 tablets (0.125 mg total) by mouth 2 (two) times daily as needed for anxiety. What changed:    when to take this  reasons to take this   apixaban 5 MG Tabs tablet Commonly known as:  ELIQUIS Take  1 tablet (5 mg total) by mouth 2 (two) times daily.   atorvastatin 40 MG tablet Commonly known as:  LIPITOR Take 1 tablet (40 mg total) by mouth daily at 6 PM.   CENTRUM SILVER PO Take 1 tablet by mouth daily.   diltiazem 180 MG 24 hr capsule Commonly known as:  TIAZAC Take 180 mg by mouth 2 (two) times daily.   HYDROcodone-acetaminophen 5-325 MG tablet Commonly known as:  NORCO/VICODIN Take 1 tablet by mouth every 6 (six) hours as needed for moderate pain.   metoprolol succinate 50 MG 24 hr tablet Commonly known as:  TOPROL-XL TAKE 1 TABLET (50 MG TOTAL) BY MOUTH 2 (TWO) TIMES DAILY. TAKE WITH OR IMMEDIATELY FOLLOWING A MEAL. What changed:    how much to take  how to take this  when to take this  additional instructions   pantoprazole 40 MG tablet Commonly known as:  PROTONIX TAKE 1 TABLET EVERY MORNING What changed:    how much to take  how to take this  when to take this  additional instructions   Vitamin D3 5000 units Caps Take 5,000 Units by mouth daily.      Allergies  Allergen Reactions  . Metoclopramide Hcl Other (See Comments)     Tremors  . Penicillins Itching    Has patient had a PCN reaction causing immediate rash, facial/tongue/throat swelling, SOB or lightheadedness with hypotension: Yes Has patient had a PCN reaction causing severe rash involving mucus membranes or skin necrosis: Yes Has patient had a PCN reaction that required hospitalization:No Has patient had a PCN reaction occurring within the last 10 years: No If all of the above answers are "NO", then may proceed with Cephalosporin use.   . Aspirin Other (See Comments)    "gave me stomach aches; made me bleed" (03/13/12)  . Zetia [Ezetimibe] Other (See Comments)    Stomach and back pain   Follow-up Information    [facility] Follow up in 4 week(s).   Specialty:  Neurology Why:  Stroke clinic.  Office to call for appointment date and time. Contact  information: [address] [place] [phone]       Lajean Manes, MD. Schedule an appointment as soon as possible for a visit in 1 week(s).   Specialty:  Internal Medicine Contact information: [address] [place] [address] [phone]            The results of significant diagnostics from this hospitalization (including imaging, microbiology, ancillary and laboratory) are listed below for reference.    Significant Diagnostic Studies: Ct Head Wo Contrast  Result Date: 10/24/2017 CLINICAL DATA:  84 year old female with acute confusion and aphasia. EXAM: CT HEAD WITHOUT CONTRAST TECHNIQUE: Contiguous axial images were obtained from the base of the skull through the vertex without intravenous contrast. COMPARISON:  02/13/2017 head CT and prior studies FINDINGS: Brain: An age indeterminate RIGHT thalamic infarct vs chronic ischemic changes now noted. No hemorrhage, midline shift, hydrocephalus,  mass lesion or mass effect. Atrophy, chronic small-vessel white matter ischemic changes and remote cerebellar infarcts again noted. Vascular: Intracranial atherosclerotic calcifications again noted. Skull: Normal. Negative for fracture or focal lesion. Sinuses/Orbits: A small mucous retention cyst/polyp within the LEFT sphenoid sinus noted. Other: None IMPRESSION: Age indeterminate RIGHT thalamic infarct vs chronic ischemic changes. Atrophy, chronic small-vessel white matter ischemic changes and remote cerebellar infarcts. Electronically Signed   By: Margarette Canada M.D.   On: 10/24/2017 15:50   Mr Brain Wo Contrast  Result Date: 10/24/2017 CLINICAL DATA:  84 y/o F; patient presenting with word-finding difficulty. History of stroke, TIA, hypertension, and atrial fibrillation. EXAM: MRI HEAD WITHOUT CONTRAST TECHNIQUE: Multiplanar, multiecho pulse sequences of the brain and surrounding structures were obtained without intravenous contrast.  COMPARISON:  10/24/2017 CT head. 04/19/2015 MRI and MRA of the head. FINDINGS: Brain: 3 mm focus of reduced diffusion within the right posterior parietal cortex (series 5, image 68) compatible with acute/early subacute infarction. No associated hemorrhage or mass effect. Several small chronic infarctions are present within the cerebellum bilaterally. Small chronic lacunar infarcts in the right thalamus, left lentiform nucleus, right caudate head. Small chronic cortical infarcts of the bilateral occipital lobes 6 and right frontal lobe. Numerous patchy nonspecific foci of T2 FLAIR hyperintense signal abnormality in subcortical and periventricular white matter are compatible with advanced chronic microvascular ischemic changes for age. Moderate brain parenchymal volume loss. No new susceptibility hypointensity to indicate interval intracranial hemorrhage. Vascular: Normal flow voids. Skull and upper cervical spine: Normal marrow signal. Sinuses/Orbits: Negative. Other: Left intra-ocular lens replacement. IMPRESSION: 1. 3 mm acute/early subacute infarction within the right posterior parietal cortex. No associated hemorrhage or mass effect. 2. Advanced chronic microvascular ischemic changes and moderate parenchymal volume loss of the brain. 3. Several small stable chronic infarctions within the cerebellum, basal ganglia, and right frontal cortex. These results will be called to the ordering clinician or representative by the Radiologist Assistant, and communication documented in the PACS or zVision Dashboard. Electronically Signed   By: Kristine Garbe M.D.   On: 10/24/2017 23:53    Microbiology: No results found for this or any previous visit (from the past 240 hour(s)).   Labs: Basic Metabolic Panel: Recent Labs  Lab 10/24/17 1341  NA 144  K 3.7  CL 104  CO2 31  GLUCOSE 102*  BUN 11  CREATININE 1.00  CALCIUM 10.0   Liver Function Tests: No results for input(s): AST, ALT, ALKPHOS, BILITOT,  PROT, ALBUMIN in the last 168 hours. No results for input(s): LIPASE, AMYLASE in the last 168 hours. No results for input(s): AMMONIA in the last 168 hours. CBC: Recent Labs  Lab 10/24/17 1341  WBC 6.3  NEUTROABS 4.6  HGB 14.9  HCT 45.3  MCV 97.8  PLT 206   Cardiac Enzymes: No results for input(s): CKTOTAL, CKMB, CKMBINDEX, TROPONINI in the last 168 hours. BNP: BNP (last 3 results) No results for input(s): BNP in the last 8760 hours.  ProBNP (last 3 results) No results for input(s): PROBNP in the last 8760 hours.  CBG: No results for input(s): GLUCAP in the last 168 hours.     Signed:  Domenic Polite MD.  [facility] 10/28/2017, 10:57 AM

## 2017-10-30 ENCOUNTER — Encounter: Payer: Self-pay | Admitting: Internal Medicine

## 2017-10-30 ENCOUNTER — Non-Acute Institutional Stay (SKILLED_NURSING_FACILITY): Payer: Medicare Other | Admitting: Internal Medicine

## 2017-10-30 DIAGNOSIS — K219 Gastro-esophageal reflux disease without esophagitis: Secondary | ICD-10-CM

## 2017-10-30 DIAGNOSIS — I639 Cerebral infarction, unspecified: Secondary | ICD-10-CM

## 2017-10-30 DIAGNOSIS — I480 Paroxysmal atrial fibrillation: Secondary | ICD-10-CM | POA: Diagnosis not present

## 2017-10-30 DIAGNOSIS — E785 Hyperlipidemia, unspecified: Secondary | ICD-10-CM | POA: Diagnosis not present

## 2017-10-30 DIAGNOSIS — I10 Essential (primary) hypertension: Secondary | ICD-10-CM | POA: Diagnosis not present

## 2017-10-30 DIAGNOSIS — F419 Anxiety disorder, unspecified: Secondary | ICD-10-CM

## 2017-10-30 NOTE — Progress Notes (Signed)
:  Location:  [place] Room Number: 08/05/1928 Place of Service:  SNF (31)   Jacqueline [name]. Jacqueline Coil, MD  PCP: Lajean Manes, MD Patient Care Team: Lajean Manes, MD as PCP - General (Internal Medicine)  Extended Emergency Contact Information Primary Emergency Contact: Marlowe Alt of [place] Mobile Phone: [phone] Relation: Son Secondary Emergency Contact: Mun,Flake Address: [address]          [place], [place] Mobile Phone: [phone] Relation: Son     Allergies: Metoclopramide hcl; Penicillins; Aspirin; and Zetia [ezetimibe]  Chief Complaint  Patient presents with  . New Admit To SNF    Admit to [facility]    HPI: Patient is 84 y.o. female with hypertension, TIA, GI bleeding, atrial fib on Eliquis who presented with speech problems.  Per patient "I could not get together my thinking right and could not remember what to do next, what I am supposed to do to put this clothing of [place] on things like that you know" her son thinks she is thin.  May be losing weight.  She reports not eating as much as she used to.  Symptoms started a year ago.  They have never been told she has dementia.  CT head-age indeterminate infarct.  Patient was admitted to [facility] from 6/25-29 for speech difficulties which resolved.  MRI/MRA showed a 3 mm acute/early subacute infarct within the right posterior parietal cortex.  Is A1c was 5.3, LDL was 93 so low-dose Lipitor was started.  All her other problems being stable patient was admitted to skilled nursing facility for OT/PT.  While at skilled nursing facility patient will be followed for atrial fibrillation treated with Eliquis metoprolol and diltiazem, anxiety treated with Xanax and GERD treated with Protonix.  Past Medical History:  Diagnosis Date  . Allergy, unspecified not elsewhere classified   . Anemia, unspecified   . Anxiety state, unspecified   . Atrial  fibrillation with rapid ventricular response ([place]) 03/13/12  . Blood in stool   . Complication of anesthesia    "hard to wake me up after colonoscopy" (03/13/12)  . Degeneration of lumbar or lumbosacral intervertebral disc   . GERD (gastroesophageal reflux disease)   . H/O hiatal hernia   . Hemorrhage of gastrointestinal tract, unspecified   . History of IBS   . Osteoarthrosis, unspecified whether generalized or localized, lower leg   . Other diseases of lung, not elsewhere classified   . Other dyspnea and respiratory abnormality   . Personal history of other diseases of digestive system   . Personal history of peptic ulcer disease   . Unspecified adverse effect of other drug, medicinal and biological substance(995.29)   . Unspecified cerebral artery occlusion with cerebral infarction 10/2008   "mini stroke" denies residual (03/13/12)  . Unspecified essential hypertension   . Unspecified paranoid state     Past Surgical History:  Procedure Laterality Date  . DILATION AND CURETTAGE OF UTERUS  1950's?  . ORIF HUMERUS FRACTURE Right 04/03/2014   Procedure: OPEN REDUCTION INTERNAL FIXATION (ORIF) RIGHT HUMERUS DISTAL FRACTURE;  Surgeon: Roseanne Kaufman, MD;  Location: [place] [facility];  Service: Orthopedics;  Laterality: Right;  . PILONIDAL CYST / SINUS EXCISION  1957  . SHOULDER ARTHROSCOPY  ~ 2000   right    Allergies as of 10/30/2017      Reactions   Metoclopramide Hcl Other (See Comments)    Tremors   Penicillins Itching   Has  patient had a PCN reaction causing immediate rash, facial/tongue/throat swelling, SOB or lightheadedness with hypotension: Yes Has patient had a PCN reaction causing severe rash involving mucus membranes or skin necrosis: Yes Has patient had a PCN reaction that required hospitalization:No Has patient had a PCN reaction occurring within the last 10 years: No If all of the above answers are "NO", then may proceed with Cephalosporin use.   Aspirin Other (See Comments)    "gave me stomach aches; made me bleed" (03/13/12)   Zetia [ezetimibe] Other (See Comments)   Stomach and back pain      Medication List        Accurate as of 10/30/17  1:57 PM. Always use your most recent med list.          ALPRAZolam 0.25 MG tablet Commonly known as:  XANAX Take 0.5 tablets (0.125 mg total) by mouth 2 (two) times daily as needed for anxiety.   apixaban 5 MG Tabs tablet Commonly known as:  ELIQUIS Take 1 tablet (5 mg total) by mouth 2 (two) times daily.   atorvastatin 40 MG tablet Commonly known as:  LIPITOR Take 1 tablet (40 mg total) by mouth daily at 6 PM.   CENTRUM SILVER PO Take 1 tablet by mouth daily.   diltiazem 180 MG 24 hr capsule Commonly known as:  TIAZAC Take 180 mg by mouth 2 (two) times daily.   HYDROcodone-acetaminophen 5-325 MG tablet Commonly known as:  NORCO/VICODIN Take 1 tablet by mouth every 6 (six) hours as needed for moderate pain.   metoprolol succinate 50 MG 24 hr tablet Commonly known as:  TOPROL-XL TAKE 1 TABLET (50 MG TOTAL) BY MOUTH 2 (TWO) TIMES DAILY. TAKE WITH OR IMMEDIATELY FOLLOWING A MEAL.   pantoprazole 40 MG tablet Commonly known as:  PROTONIX TAKE 1 TABLET EVERY MORNING   Vitamin D3 5000 units Caps Take 5,000 Units by mouth daily.       No orders of the defined types were placed in this encounter.   Immunization History  Administered Date(s) Administered  . Influenza Split 01/24/2011, 02/14/2012  . Influenza Whole 01/28/2009  . Influenza,inj,Quad PF,6+ Mos 01/14/2013, 04/05/2014  . Pneumococcal Polysaccharide-23 01/28/2009  . Tdap 03/31/2014  . Zoster 01/24/2011    Social History   Tobacco Use  . Smoking status: Former Smoker    Packs/day: 1.00    Years: 40.00    Pack years: 40.00    Types: Cigarettes    Last attempt to quit: 05/02/2002    Years since quitting: 15.5  . Smokeless tobacco: Never Used  Substance Use Topics  . Alcohol use: No    Family history is   Family History  Problem  Relation Age of Onset  . Diabetes Mother   . CVA Mother   . CVA Father   . CVA Maternal Grandmother       Review of Systems  DATA OBTAINED: from patient,  family member GENERAL:  no fevers, fatigue, appetite changes SKIN: No itching, or rash EYES: No eye pain, redness, discharge EARS: No earache, tinnitus, change in hearing NOSE: No congestion, drainage or bleeding  MOUTH/THROAT: No mouth or tooth pain, No sore throat RESPIRATORY: No cough, wheezing, SOB CARDIAC: No chest pain, palpitations, lower extremity edema  GI: No abdominal pain, No N/V/D or constipation, No heartburn or reflux  GU: No dysuria, frequency or urgency, or incontinence  MUSCULOSKELETAL: No unrelieved bone/joint pain NEUROLOGIC: No headache, dizziness or focal weakness; still some instances of speech difficulty PSYCHIATRIC: No  c/o anxiety or sadness   Vitals:   10/30/17 1345  BP: 100/69  Pulse: 90  Resp: 18  Temp: 98 F (36.7 C)  SpO2: 96%    SpO2 Readings from Last 1 Encounters:  10/30/17 96%   Body mass index is 26.56 kg/m.     Physical Exam  GENERAL APPEARANCE: Alert, conversant,  No acute distress.  SKIN: No diaphoresis rash HEAD: Normocephalic, atraumatic  EYES: Conjunctiva/lids clear. Pupils round, reactive. EOMs intact.  EARS: External exam WNL, canals clear. Hearing grossly normal.  NOSE: No deformity or discharge.  MOUTH/THROAT: Lips w/o lesions  RESPIRATORY: Breathing is even, unlabored. Lung sounds are clear   CARDIOVASCULAR: Heart RRR no murmurs, rubs or gallops. No peripheral edema.   GASTROINTESTINAL: Abdomen is soft, non-tender, not distended w/ normal bowel sounds. GENITOURINARY: Bladder non tender, not distended  MUSCULOSKELETAL: No abnormal joints or musculature NEUROLOGIC:  Cranial nerves 2-12 grossly intact patient does have some  a phasic episodes: Moves all extremities  PSYCHIATRIC: Mood and affect appropriate to situation, possibly some dementia no behavioral  issues  Patient Active Problem List   Diagnosis Date Noted  . CVA (cerebral vascular accident) ([place]) 10/25/2017  . Benign essential HTN   . History of TIA (transient ischemic attack)   . TIA (transient ischemic attack) 10/24/2017  . Cerebral infarction due to embolism of right middle cerebral artery ([place])   . HLD (hyperlipidemia)   . Acute ischemic right MCA stroke ([place]) 06/18/2014  . Facial droop   . Displaced fracture of shaft of left humerus 04/03/2014  . Right arm fracture 04/01/2014  . Displaced spiral fracture of shaft of humerus   . Peripheral arterial disease ([place]) 10/03/2013  . Chronic diastolic CHF (congestive heart failure) ([place]) 08/08/2013  . Chronic venous insufficiency 04/02/2013  . Hypokalemia 01/13/2013  . PAF (paroxysmal atrial fibrillation) ([place]) 10/03/2012  . CAD (coronary artery disease) of artery bypass graft 04/05/2012  . Atrial fibrillation with RVR ([place]) 03/13/2012  . Need for prophylactic vaccination and inoculation against influenza 02/14/2012  . ABDOMINAL WALL HERNIA 01/21/2010  . PARESTHESIA 05/04/2009  . SKIN LESION 01/28/2009  . Nocturia 01/28/2009  . DYSPNEA 01/01/2009  . CVA (cerebral infarction) 11/17/2008  . ALLERGY 10/27/2008  . PAROXYSMAL NOCTURNAL DYSPNEA 10/03/2008  . Osteoarthrosis, unspecified whether generalized or localized, involving lower leg 09/03/2008  . RESTRICTIVE LUNG DISEASE 05/26/2008  . Anemia 01/08/2008  . UNS ADVRS EFF OTH RX MEDICINAL&BIOLOGICAL SBSTNC 11/09/2007  . HEMATOCHEZIA 07/18/2007  . PARANOIA 06/01/2007  . Anxiety state 01/23/2007  . Hypertensive heart disease with CHF ([place]) 01/23/2007  . GI BLEEDING 01/23/2007  . Osteoarthritis 01/23/2007  . DEGENERATIVE DISC DISEASE, LUMBAR SPINE 01/23/2007  . History of peptic ulcer disease 01/23/2007  . Personal history of other diseases of digestive system 01/23/2007      Labs reviewed: Basic Metabolic Panel:    Component Value Date/Time   NA 144 10/24/2017 1341    K 3.7 10/24/2017 1341   CL 104 10/24/2017 1341   CO2 31 10/24/2017 1341   GLUCOSE 102 (H) 10/24/2017 1341   BUN 11 10/24/2017 1341   CREATININE 1.00 10/24/2017 1341   CALCIUM 10.0 10/24/2017 1341   PROT 7.1 02/13/2017 1650   ALBUMIN 4.2 02/13/2017 1650   AST 23 02/13/2017 1650   ALT 13 (L) 02/13/2017 1650   ALKPHOS 72 02/13/2017 1650   BILITOT 0.7 02/13/2017 1650   GFRNONAA 49 (L) 10/24/2017 1341   GFRAA 57 (L) 10/24/2017 1341    Recent Labs  02/13/17 1650 10/24/17 1341  NA 140 144  K 5.1 3.7  CL 101 104  CO2 32 31  GLUCOSE 102* 102*  BUN 14 11  CREATININE 1.00 1.00  CALCIUM 10.0 10.0   Liver Function Tests: Recent Labs    02/13/17 1650  AST 23  ALT 13*  ALKPHOS 72  BILITOT 0.7  PROT 7.1  ALBUMIN 4.2   No results for input(s): LIPASE, AMYLASE in the last 8760 hours. No results for input(s): AMMONIA in the last 8760 hours. CBC: Recent Labs    02/13/17 1650 10/24/17 1341  WBC 4.9 6.3  NEUTROABS 3.3 4.6  HGB 15.3* 14.9  HCT 46.2* 45.3  MCV 98.5 97.8  PLT 209 206   Lipid Recent Labs    10/25/17 0452  CHOL 190  HDL 74  LDLCALC 93  TRIG 115    Cardiac Enzymes: No results for input(s): CKTOTAL, CKMB, CKMBINDEX, TROPONINI in the last 8760 hours. BNP: No results for input(s): BNP in the last 8760 hours. No results found for: [facility] Lab Results  Component Value Date   HGBA1C 5.3 10/25/2017   Lab Results  Component Value Date   TSH 1.539 10/24/2017   Lab Results  Component Value Date   VITAMINB12 640 04/01/2014   Lab Results  Component Value Date   FOLATE >20.0 05/06/2010   Lab Results  Component Value Date   IRON 87 05/06/2010    Imaging and Procedures obtained prior to SNF admission: Ct Head Wo Contrast  Result Date: 10/24/2017 CLINICAL DATA:  84 year old female with acute confusion and aphasia. EXAM: CT HEAD WITHOUT CONTRAST TECHNIQUE: Contiguous axial images were obtained from the base of the skull through the vertex  without intravenous contrast. COMPARISON:  02/13/2017 head CT and prior studies FINDINGS: Brain: An age indeterminate RIGHT thalamic infarct vs chronic ischemic changes now noted. No hemorrhage, midline shift, hydrocephalus, mass lesion or mass effect. Atrophy, chronic small-vessel white matter ischemic changes and remote cerebellar infarcts again noted. Vascular: Intracranial atherosclerotic calcifications again noted. Skull: Normal. Negative for fracture or focal lesion. Sinuses/Orbits: A small mucous retention cyst/polyp within the LEFT sphenoid sinus noted. Other: None IMPRESSION: Age indeterminate RIGHT thalamic infarct vs chronic ischemic changes. Atrophy, chronic small-vessel white matter ischemic changes and remote cerebellar infarcts. Electronically Signed   By: Margarette Canada M.D.   On: 10/24/2017 15:50   Mr Brain Wo Contrast  Result Date: 10/24/2017 CLINICAL DATA:  84 y/o F; patient presenting with word-finding difficulty. History of stroke, TIA, hypertension, and atrial fibrillation. EXAM: MRI HEAD WITHOUT CONTRAST TECHNIQUE: Multiplanar, multiecho pulse sequences of the brain and surrounding structures were obtained without intravenous contrast. COMPARISON:  10/24/2017 CT head. 04/19/2015 MRI and MRA of the head. FINDINGS: Brain: 3 mm focus of reduced diffusion within the right posterior parietal cortex (series 5, image 68) compatible with acute/early subacute infarction. No associated hemorrhage or mass effect. Several small chronic infarctions are present within the cerebellum bilaterally. Small chronic lacunar infarcts in the right thalamus, left lentiform nucleus, right caudate head. Small chronic cortical infarcts of the bilateral occipital lobes 6 and right frontal lobe. Numerous patchy nonspecific foci of T2 FLAIR hyperintense signal abnormality in subcortical and periventricular white matter are compatible with advanced chronic microvascular ischemic changes for age. Moderate brain parenchymal  volume loss. No new susceptibility hypointensity to indicate interval intracranial hemorrhage. Vascular: Normal flow voids. Skull and upper cervical spine: Normal marrow signal. Sinuses/Orbits: Negative. Other: Left intra-ocular lens replacement. IMPRESSION: 1. 3 mm acute/early subacute infarction within  the right posterior parietal cortex. No associated hemorrhage or mass effect. 2. Advanced chronic microvascular ischemic changes and moderate parenchymal volume loss of the brain. 3. Several small stable chronic infarctions within the cerebellum, basal ganglia, and right frontal cortex. These results will be called to the ordering clinician or representative by the Radiologist Assistant, and communication documented in the PACS or zVision Dashboard. Electronically Signed   By: Kristine Garbe M.D.   On: 10/24/2017 23:53     Not all labs, radiology exams or other studies done during hospitalization come through on my EPIC note; however they are reviewed by me.    Assessment and Plan  Acute CVA- patient admitted with speech difficulties which were felt to have been resolved; MRI/MRA with 3 mm early/acute subacute infarct within the right posterior parietal cortex; Dr. Leonie Man, neurology recommended continue home regimen of Eliquis; echo in February with EF of 55 to 60% with no cardiac source of emboli; LDL 93, Lipitor started and hemoglobin A1c 5.3 SNF -admitted for OT/PT  Hyperlipidemia-LDL is 93 to Lipitor started for risk factor control SNF -continue Lipitor 40 mg daily  Hypertension SNF -controlled; continue diltiazem 180 daily milligrams twice daily and Toprol-XL 50 mg twice daily  Paroxysmal atrial fibrillation SNF -continue Cardizem 180 mg twice daily and Toprol-XL 50 mg twice daily and Eliquis 5 mg twice daily  Anxiety SNF -appears controlled; continue Xanax 0.125 mg twice daily  GERD SNF -no complaint of; continue Protonix 40 mg daily   Time spent greater than 45 minutes;>  50% of time with patient was spent reviewing records, labs, tests and studies, counseling and developing plan of care  Webb Silversmith D. Jacqueline Coil, MD

## 2017-10-30 NOTE — ED Provider Notes (Signed)
[facility] 3W PROGRESSIVE CARE Provider Note   CSN: [id number] Arrival date & time: 10/24/17  1325     History   Chief Complaint Chief Complaint  Patient presents with  . Altered Mental Status    HPI Jacqueline [name] is a 84 y.o. female.  HPI   84 year old female with confusion and some difficulty with word finding.  She states that she first noticed symptoms around noon.  She woke up and felt like she was having difficulty finding her words.  Even prior to this for the past several days to weeks she is felt like she has had trouble remembering things.  Denies any acute numbness, tingling focal loss of strength.  No acute visual complaints or slurred speech.  Past Medical History:  Diagnosis Date  . Allergy, unspecified not elsewhere classified   . Anemia, unspecified   . Anxiety state, unspecified   . Atrial fibrillation with rapid ventricular response ([place]) 03/13/12  . Blood in stool   . Complication of anesthesia    "hard to wake me up after colonoscopy" (03/13/12)  . Degeneration of lumbar or lumbosacral intervertebral disc   . GERD (gastroesophageal reflux disease)   . H/O hiatal hernia   . Hemorrhage of gastrointestinal tract, unspecified   . History of IBS   . Osteoarthrosis, unspecified whether generalized or localized, lower leg   . Other diseases of lung, not elsewhere classified   . Other dyspnea and respiratory abnormality   . Personal history of other diseases of digestive system   . Personal history of peptic ulcer disease   . Unspecified adverse effect of other drug, medicinal and biological substance(995.29)   . Unspecified cerebral artery occlusion with cerebral infarction 10/2008   "mini stroke" denies residual (03/13/12)  . Unspecified essential hypertension   . Unspecified paranoid state     Patient Active Problem List   Diagnosis Date Noted  . CVA (cerebral vascular accident) ([place]) 10/25/2017  . Benign essential HTN   . History of TIA (transient  ischemic attack)   . TIA (transient ischemic attack) 10/24/2017  . Cerebral infarction due to embolism of right middle cerebral artery ([place])   . HLD (hyperlipidemia)   . Acute ischemic right MCA stroke ([place]) 06/18/2014  . Facial droop   . Displaced fracture of shaft of left humerus 04/03/2014  . Right arm fracture 04/01/2014  . Displaced spiral fracture of shaft of humerus   . Peripheral arterial disease ([place]) 10/03/2013  . Chronic diastolic CHF (congestive heart failure) ([place]) 08/08/2013  . Chronic venous insufficiency 04/02/2013  . Hypokalemia 01/13/2013  . PAF (paroxysmal atrial fibrillation) ([place]) 10/03/2012  . CAD (coronary artery disease) of artery bypass graft 04/05/2012  . Atrial fibrillation with RVR ([place]) 03/13/2012  . Need for prophylactic vaccination and inoculation against influenza 02/14/2012  . ABDOMINAL WALL HERNIA 01/21/2010  . PARESTHESIA 05/04/2009  . SKIN LESION 01/28/2009  . Nocturia 01/28/2009  . DYSPNEA 01/01/2009  . CVA (cerebral infarction) 11/17/2008  . ALLERGY 10/27/2008  . PAROXYSMAL NOCTURNAL DYSPNEA 10/03/2008  . Osteoarthrosis, unspecified whether generalized or localized, involving lower leg 09/03/2008  . RESTRICTIVE LUNG DISEASE 05/26/2008  . Anemia 01/08/2008  . UNS ADVRS EFF OTH RX MEDICINAL&BIOLOGICAL SBSTNC 11/09/2007  . HEMATOCHEZIA 07/18/2007  . PARANOIA 06/01/2007  . Anxiety state 01/23/2007  . Hypertensive heart disease with CHF ([place]) 01/23/2007  . GI BLEEDING 01/23/2007  . Osteoarthritis 01/23/2007  . DEGENERATIVE DISC DISEASE, LUMBAR SPINE 01/23/2007  . History of peptic ulcer disease 01/23/2007  .  Personal history of other diseases of digestive system 01/23/2007    Past Surgical History:  Procedure Laterality Date  . DILATION AND CURETTAGE OF UTERUS  1950's?  . ORIF HUMERUS FRACTURE Right 04/03/2014   Procedure: OPEN REDUCTION INTERNAL FIXATION (ORIF) RIGHT HUMERUS DISTAL FRACTURE;  Surgeon: Roseanne Kaufman, MD;  Location: [facility];   Service: Orthopedics;  Laterality: Right;  . PILONIDAL CYST / SINUS EXCISION  1957  . SHOULDER ARTHROSCOPY  ~ 2000   right     OB History   None      Home Medications    Prior to Admission medications   Medication Sig Start Date End Date Taking? Authorizing Provider  apixaban (ELIQUIS) 5 MG TABS tablet Take 1 tablet (5 mg total) by mouth 2 (two) times daily. 06/20/14  Yes Regalado, Belkys A, MD  Cholecalciferol (VITAMIN D3) 5000 UNITS CAPS Take 5,000 Units by mouth daily.   Yes [provider]  diltiazem (TIAZAC) 180 MG 24 hr capsule Take 180 mg by mouth 2 (two) times daily.  01/11/17  Yes [provider]  HYDROcodone-acetaminophen (NORCO/VICODIN) 5-325 MG per tablet Take 1 tablet by mouth every 6 (six) hours as needed for moderate pain.   Yes [provider]  metoprolol succinate (TOPROL-XL) 50 MG 24 hr tablet TAKE 1 TABLET (50 MG TOTAL) BY MOUTH 2 (TWO) TIMES DAILY. TAKE WITH OR IMMEDIATELY FOLLOWING A MEAL. Patient taking differently: Take 50 mg by mouth 2 (two) times daily. Marland Kitchen 03/25/14  Yes Larey Dresser, MD  Multiple Vitamins-Minerals (CENTRUM SILVER PO) Take 1 tablet by mouth daily.   Yes [provider]  pantoprazole (PROTONIX) 40 MG tablet TAKE 1 TABLET EVERY MORNING Patient taking differently: Take 40 mg by mouth daily. TAKE 1 TABLET EVERY MORNING   Yes Hendricks Limes, MD  ALPRAZolam Duanne Moron) 0.25 MG tablet Take 0.5 tablets (0.125 mg total) by mouth 2 (two) times daily as needed for anxiety. 10/28/17   Domenic Polite, MD  atorvastatin (LIPITOR) 40 MG tablet Take 1 tablet (40 mg total) by mouth daily at 6 PM. 10/28/17   Domenic Polite, MD    Family History Family History  Problem Relation Age of Onset  . Diabetes Mother   . CVA Mother   . CVA Father   . CVA Maternal Grandmother     Social History Social History   Tobacco Use  . Smoking status: Former Smoker    Packs/day: 1.00    Years: 40.00    Pack years: 40.00    Types:  Cigarettes    Last attempt to quit: 05/02/2002    Years since quitting: 15.5  . Smokeless tobacco: Never Used  Substance Use Topics  . Alcohol use: No  . Drug use: No     Allergies   Metoclopramide hcl; Penicillins; Aspirin; and Zetia [ezetimibe]   Review of Systems Review of Systems   Physical Exam Updated Vital Signs BP (!) 148/81 (BP Location: Right Arm)   Pulse 77   Temp 97.6 F (36.4 C) (Oral)   Resp 16   Ht 5' (1.524 m)   Wt 61.9 kg (136 lb 7.4 oz)   SpO2 97%   BMI 26.65 kg/m   Physical Exam  Constitutional: She appears well-developed and well-nourished. No distress.  HENT:  Head: Normocephalic and atraumatic.  Eyes: Conjunctivae are normal. Right eye exhibits no discharge. Left eye exhibits no discharge.  Neck: Neck supple.  Cardiovascular: Normal rate, regular rhythm and normal heart sounds. Exam reveals no gallop and  no friction rub.  No murmur heard. Pulmonary/Chest: Effort normal and breath sounds normal. No respiratory distress.  Abdominal: Soft. She exhibits no distension. There is no tenderness.  Musculoskeletal: She exhibits no edema or tenderness.  Neurological: She is alert. No cranial nerve deficit. Coordination normal.  Patient is somewhat hesitant and pauses intermittently particularly when trying to initiate speech.  Content is of appropriate otherwise.  Fluent.  Skin: Skin is warm and dry.  Psychiatric: She has a normal mood and affect. Her behavior is normal. Thought content normal.  Nursing note and vitals reviewed.    ED Treatments / Results  Labs (all labs ordered are listed, but only abnormal results are displayed) Labs Reviewed  BASIC METABOLIC PANEL - Abnormal; Notable for the following components:      Result Value   Glucose, Bld 102 (*)    GFR calc non Af Amer 49 (*)    GFR calc Af Amer 57 (*)    All other components within normal limits  CBC WITH DIFFERENTIAL/PLATELET  URINALYSIS, ROUTINE W REFLEX MICROSCOPIC  HEMOGLOBIN A1C    LIPID PANEL  TSH    EKG EKG Interpretation  Date/Time:  Tuesday October 24 2017 13:29:31 EDT Ventricular Rate:  71 PR Interval:    QRS Duration: 79 QT Interval:  393 QTC Calculation: 428 R Axis:   -33 Text Interpretation:  Sinus rhythm  left atrial enlargement Left axis deviation Nonspecific T abnrm, anterolateral leads Confirmed by Virgel Manifold [phone]) on 10/24/2017 3:21:46 PM   Radiology No results found.  Procedures Procedures (including critical care time)  Medications Ordered in ED Medications   stroke: mapping our early stages of recovery book ( Does not apply Given 10/27/17 1817)  ALPRAZolam Duanne Moron) tablet 0.25 mg (0.25 mg Oral Given 10/24/17 2214)     Initial Impression / Assessment and Plan / ED Course  I have reviewed the triage vital signs and the nursing notes.  Pertinent labs & imaging results that were available during my care of the patient were reviewed by me and considered in my medical decision making (see chart for details).     89yF with expressive aphasia versus confusion. CT with age indeterminant infarct. Neurology consultation. Admit.   Final Clinical Impressions(s) / ED Diagnoses   Final diagnoses:  Cerebral infarction due to embolism of right middle cerebral artery [facility])    ED Discharge Orders        Ordered    atorvastatin (LIPITOR) 40 MG tablet  Daily-1800     10/28/17 1057    ALPRAZolam (XANAX) 0.25 MG tablet  2 times daily PRN     10/28/17 1057    Increase activity slowly     10/28/17 1057    Diet - low sodium heart healthy     10/28/17 1057    Ambulatory referral to Neurology    Comments:  Follow up with stroke clinic NP (Jessica Vanschaick or Cecille Rubin, if both not available, consider Dr. Antony Contras, Dr. Bess Harvest, or Dr. Sarina Ill) at [facility] in about 4 weeks, following discharge from inpatient rehab.   10/25/17 1336    Ambulatory referral to Physical Medicine Rehab    Comments:  1 month  outpt stroke follow up   10/25/17 1540       Virgel Manifold, MD 10/30/17 0113

## 2017-11-03 ENCOUNTER — Non-Acute Institutional Stay (SKILLED_NURSING_FACILITY): Payer: Medicare Other | Admitting: Internal Medicine

## 2017-11-03 ENCOUNTER — Observation Stay (HOSPITAL_COMMUNITY): Payer: Medicare Other

## 2017-11-03 ENCOUNTER — Emergency Department (HOSPITAL_COMMUNITY): Payer: Medicare Other

## 2017-11-03 ENCOUNTER — Encounter (HOSPITAL_COMMUNITY): Payer: Self-pay

## 2017-11-03 ENCOUNTER — Encounter: Payer: Self-pay | Admitting: Internal Medicine

## 2017-11-03 ENCOUNTER — Observation Stay (HOSPITAL_COMMUNITY)
Admission: EM | Admit: 2017-11-03 | Discharge: 2017-11-07 | Disposition: A | Discharge status: Home / Self Care | Payer: Medicare Other | Attending: Family Medicine | Admitting: Family Medicine

## 2017-11-03 ENCOUNTER — Other Ambulatory Visit: Payer: Self-pay

## 2017-11-03 DIAGNOSIS — Z7901 Long term (current) use of anticoagulants: Secondary | ICD-10-CM | POA: Insufficient documentation

## 2017-11-03 DIAGNOSIS — F0391 Unspecified dementia with behavioral disturbance: Secondary | ICD-10-CM

## 2017-11-03 DIAGNOSIS — R4701 Aphasia: Secondary | ICD-10-CM

## 2017-11-03 DIAGNOSIS — Z8673 Personal history of transient ischemic attack (TIA), and cerebral infarction without residual deficits: Secondary | ICD-10-CM | POA: Diagnosis not present

## 2017-11-03 DIAGNOSIS — Z87891 Personal history of nicotine dependence: Secondary | ICD-10-CM | POA: Diagnosis not present

## 2017-11-03 DIAGNOSIS — I5032 Chronic diastolic (congestive) heart failure: Secondary | ICD-10-CM | POA: Insufficient documentation

## 2017-11-03 DIAGNOSIS — R4020 Unspecified coma: Secondary | ICD-10-CM | POA: Diagnosis not present

## 2017-11-03 DIAGNOSIS — F419 Anxiety disorder, unspecified: Secondary | ICD-10-CM | POA: Insufficient documentation

## 2017-11-03 DIAGNOSIS — R4182 Altered mental status, unspecified: Principal | ICD-10-CM | POA: Insufficient documentation

## 2017-11-03 DIAGNOSIS — R410 Disorientation, unspecified: Secondary | ICD-10-CM

## 2017-11-03 DIAGNOSIS — I110 Hypertensive heart disease with heart failure: Secondary | ICD-10-CM | POA: Insufficient documentation

## 2017-11-03 DIAGNOSIS — I447 Left bundle-branch block, unspecified: Secondary | ICD-10-CM | POA: Diagnosis not present

## 2017-11-03 DIAGNOSIS — Z79899 Other long term (current) drug therapy: Secondary | ICD-10-CM | POA: Diagnosis not present

## 2017-11-03 LAB — CBC WITH DIFFERENTIAL/PLATELET
Abs Immature Granulocytes: 0 10*3/uL (ref 0.0–0.1)
Basophils Absolute: 0 10*3/uL (ref 0.0–0.1)
Basophils Relative: 0 %
EOS ABS: 0 10*3/uL (ref 0.0–0.7)
EOS PCT: 0 %
HCT: 47.7 % — ABNORMAL HIGH (ref 36.0–46.0)
Hemoglobin: 15.3 g/dL — ABNORMAL HIGH (ref 12.0–15.0)
Immature Granulocytes: 0 %
LYMPHS ABS: 1.6 10*3/uL (ref 0.7–4.0)
Lymphocytes Relative: 21 %
MCH: 31.7 pg (ref 26.0–34.0)
MCHC: 32.1 g/dL (ref 30.0–36.0)
MCV: 99 fL (ref 78.0–100.0)
Monocytes Absolute: 0.7 10*3/uL (ref 0.1–1.0)
Monocytes Relative: 9 %
Neutro Abs: 5 10*3/uL (ref 1.7–7.7)
Neutrophils Relative %: 68 %
Platelets: 260 10*3/uL (ref 150–400)
RBC: 4.82 MIL/uL (ref 3.87–5.11)
RDW: 13.5 % (ref 11.5–15.5)
WBC: 7.4 10*3/uL (ref 4.0–10.5)

## 2017-11-03 LAB — CBC AND DIFFERENTIAL
HEMATOCRIT: 46 (ref 36–46)
Hemoglobin: 15.7 (ref 12.0–16.0)
PLATELETS: 222 (ref 150–399)
WBC: 7

## 2017-11-03 LAB — COMPREHENSIVE METABOLIC PANEL
ALBUMIN: 3.7 g/dL (ref 3.5–5.0)
ALT: 15 U/L (ref 0–44)
ANION GAP: 11 (ref 5–15)
AST: 28 U/L (ref 15–41)
Alkaline Phosphatase: 77 U/L (ref 38–126)
BILIRUBIN TOTAL: 1.4 mg/dL — AB (ref 0.3–1.2)
BUN: 26 mg/dL — ABNORMAL HIGH (ref 8–23)
CO2: 23 mmol/L (ref 22–32)
Calcium: 10.2 mg/dL (ref 8.9–10.3)
Chloride: 106 mmol/L (ref 98–111)
Creatinine, Ser: 1.14 mg/dL — ABNORMAL HIGH (ref 0.44–1.00)
GFR calc non Af Amer: 42 mL/min — ABNORMAL LOW (ref >60)
GFR, EST AFRICAN AMERICAN: 49 mL/min — AB (ref >60)
GLUCOSE: 109 mg/dL — AB (ref 70–99)
POTASSIUM: 4.6 mmol/L (ref 3.5–5.1)
SODIUM: 140 mmol/L (ref 135–145)
TOTAL PROTEIN: 6.9 g/dL (ref 6.5–8.1)

## 2017-11-03 LAB — BASIC METABOLIC PANEL
BUN: 23 — AB (ref 4–21)
Creatinine: 0.9 (ref 0.5–1.1)
GLUCOSE: 100
Potassium: 4.3 (ref 3.4–5.3)
Sodium: 142 (ref 137–147)

## 2017-11-03 LAB — URINALYSIS, ROUTINE W REFLEX MICROSCOPIC
Bilirubin Urine: NEGATIVE
GLUCOSE, UA: NEGATIVE mg/dL
Hgb urine dipstick: NEGATIVE
Ketones, ur: NEGATIVE mg/dL
LEUKOCYTES UA: NEGATIVE
Nitrite: NEGATIVE
PROTEIN: NEGATIVE mg/dL
Specific Gravity, Urine: 1.015 (ref 1.005–1.030)
pH: 5 (ref 5.0–8.0)

## 2017-11-03 LAB — VITAMIN B12: VITAMIN B 12: 736 pg/mL (ref 180–914)

## 2017-11-03 LAB — AMMONIA: Ammonia: 11 umol/L (ref 9–35)

## 2017-11-03 LAB — TSH: TSH: 1.824 u[IU]/mL (ref 0.350–4.500)

## 2017-11-03 LAB — CBG MONITORING, ED: Glucose-Capillary: 110 mg/dL — ABNORMAL HIGH (ref 70–99)

## 2017-11-03 MED ORDER — ALPRAZOLAM 0.25 MG PO TABS: 0.1250 mg | ORAL_TABLET | Freq: Two times a day (BID) | ORAL | Status: DC | PRN

## 2017-11-03 MED ORDER — METOPROLOL SUCCINATE ER 25 MG PO TB24: 50.0000 mg | ORAL_TABLET | Freq: Two times a day (BID) | ORAL | Status: DC

## 2017-11-03 MED ORDER — DILTIAZEM HCL ER COATED BEADS 180 MG PO CP24
180.0000 mg | ORAL_CAPSULE | Freq: Two times a day (BID) | ORAL | Status: DC
  Administered 2017-11-04: 180 mg via ORAL
  Filled 2017-11-03 (×2): qty 1

## 2017-11-03 MED ORDER — DILTIAZEM HCL ER BEADS 180 MG PO CP24
180.0000 mg | ORAL_CAPSULE | Freq: Two times a day (BID) | ORAL | Status: DC
  Filled 2017-11-03 (×2): qty 1

## 2017-11-03 MED ORDER — ACETAMINOPHEN 160 MG/5ML PO SOLN: 650.0000 mg | ORAL | Status: DC | PRN

## 2017-11-03 MED ORDER — STROKE: EARLY STAGES OF RECOVERY BOOK: Freq: Once | Status: DC

## 2017-11-03 MED ORDER — APIXABAN 2.5 MG PO TABS
2.5000 mg | ORAL_TABLET | Freq: Two times a day (BID) | ORAL | Status: DC
  Administered 2017-11-04: 2.5 mg via ORAL
  Filled 2017-11-03: qty 1

## 2017-11-03 MED ORDER — ACETAMINOPHEN 325 MG PO TABS
650.0000 mg | ORAL_TABLET | ORAL | Status: DC | PRN
  Administered 2017-11-05 – 2017-11-06 (×4): 650 mg via ORAL
  Filled 2017-11-03 (×5): qty 2

## 2017-11-03 MED ORDER — PANTOPRAZOLE SODIUM 40 MG PO TBEC
40.0000 mg | DELAYED_RELEASE_TABLET | Freq: Every day | ORAL | Status: DC
  Administered 2017-11-04 – 2017-11-07 (×4): 40 mg via ORAL
  Filled 2017-11-03 (×4): qty 1

## 2017-11-03 MED ORDER — SODIUM CHLORIDE 0.9 % IV BOLUS
1000.0000 mL | Freq: Once | INTRAVENOUS | Status: AC
  Administered 2017-11-03: 1000 mL via INTRAVENOUS

## 2017-11-03 MED ORDER — APIXABAN 5 MG PO TABS: 5.0000 mg | ORAL_TABLET | Freq: Two times a day (BID) | ORAL | Status: DC

## 2017-11-03 MED ORDER — ACETAMINOPHEN 650 MG RE SUPP: 650.0000 mg | RECTAL | Status: DC | PRN

## 2017-11-03 NOTE — ED Notes (Signed)
Family at bedside. 

## 2017-11-03 NOTE — ED Triage Notes (Signed)
Pt from [facility] for AMS, pt baseline oriented x4 and can have a full on conversation. LSN: roughly around 7pm last night. Since then pt has been talking out of her head, pt able to follow commands at this time but is confused, unable to answer questions. VSS CBG 129 Hx of TIAs

## 2017-11-03 NOTE — ED Provider Notes (Signed)
Patient's care signed out to follow-up blood work, CT head and reassess.  Discussed with family members patient still not at baseline since yesterday.  Recent stroke and rehab placement.  Patient still generally confused.  Discussed with hospitalist for admission for close monitoring and possible MRI of the brain if no improvement.  Patient did have a Norco this morning which may have been related.  Blood work reviewed and urinalysis unremarkable.  CT had no bleeding.  Golda Acre, MD 11/03/17 [phone]

## 2017-11-03 NOTE — ED Notes (Signed)
ED Provider at bedside. 

## 2017-11-03 NOTE — Progress Notes (Signed)
Location:  [facility] Room Number: 101P Place of Service:  SNF (31)  Jacqueline Delaine. Sheppard Coil, MD  Patient Care Team: Lajean Manes, MD as PCP - General (Internal Medicine)  Extended Emergency Contact Information Primary Emergency Contact: Marlowe Alt of [place] Mobile Phone: [phone] Relation: Son Secondary Emergency Contact: Evola,Flake Address: [address]          [place], [place] Mobile Phone: [phone] Relation: Son    Allergies: Metoclopramide hcl; Penicillins; Aspirin; and Zetia [ezetimibe]  Chief Complaint  Patient presents with  . Acute Visit    Possible stroke     HPI: Patient is 84 y.o. female who asked me to see acutely.  Patient was fine this morning, ate her breakfast, was her usual self.  Now patient is speaking but not making sense and appears that what she means to say and what is actually come out are not the same.  Patient's pupils are midrange, meaning she has probably not been exposed to narcotics this morning and patient has no focal weaknesses.  Patient has not had sudden loss of bowel or bladder.  Past Medical History:  Diagnosis Date  . Allergy, unspecified not elsewhere classified   . Anemia, unspecified   . Anxiety state, unspecified   . Atrial fibrillation with rapid ventricular response ([place]) 03/13/12  . Blood in stool   . Complication of anesthesia    "hard to wake me up after colonoscopy" (03/13/12)  . Degeneration of lumbar or lumbosacral intervertebral disc   . GERD (gastroesophageal reflux disease)   . H/O hiatal hernia   . Hemorrhage of gastrointestinal tract, unspecified   . History of IBS   . Osteoarthrosis, unspecified whether generalized or localized, lower leg   . Other diseases of lung, not elsewhere classified   . Other dyspnea and respiratory abnormality   . Personal history of other diseases of digestive system   . Personal history of peptic ulcer  disease   . Unspecified adverse effect of other drug, medicinal and biological substance(995.29)   . Unspecified cerebral artery occlusion with cerebral infarction 10/2008   "mini stroke" denies residual (03/13/12)  . Unspecified essential hypertension   . Unspecified paranoid state     Past Surgical History:  Procedure Laterality Date  . DILATION AND CURETTAGE OF UTERUS  1950's?  . ORIF HUMERUS FRACTURE Right 04/03/2014   Procedure: OPEN REDUCTION INTERNAL FIXATION (ORIF) RIGHT HUMERUS DISTAL FRACTURE;  Surgeon: Roseanne Kaufman, MD;  Location: [facility];  Service: Orthopedics;  Laterality: Right;  . PILONIDAL CYST / SINUS EXCISION  1957  . SHOULDER ARTHROSCOPY  ~ 2000   right    Allergies as of 11/03/2017      Reactions   Metoclopramide Hcl Other (See Comments)    Tremors   Penicillins Itching   Has patient had a PCN reaction causing immediate rash, facial/tongue/throat swelling, SOB or lightheadedness with hypotension: Yes Has patient had a PCN reaction causing severe rash involving mucus membranes or skin necrosis: Yes Has patient had a PCN reaction that required hospitalization:No Has patient had a PCN reaction occurring within the last 10 years: No If all of the above answers are "NO", then may proceed with Cephalosporin use.   Aspirin Other (See Comments)   "gave me stomach aches; made me bleed" (03/13/12)   Zetia [ezetimibe] Other (See Comments)   Stomach and back pain      Medication List  Accurate as of 11/03/17  3:26 PM. Always use your most recent med list.          ALPRAZolam 0.25 MG tablet Commonly known as:  XANAX Take 0.5 tablets (0.125 mg total) by mouth 2 (two) times daily as needed for anxiety.   apixaban 5 MG Tabs tablet Commonly known as:  ELIQUIS Take 1 tablet (5 mg total) by mouth 2 (two) times daily.   atorvastatin 40 MG tablet Commonly known as:  LIPITOR Take 1 tablet (40 mg total) by mouth daily at 6 PM.   CENTRUM SILVER PO Take 1 tablet by  mouth daily.   diltiazem 180 MG 24 hr capsule Commonly known as:  TIAZAC Take 180 mg by mouth 2 (two) times daily.   HYDROcodone-acetaminophen 5-325 MG tablet Commonly known as:  NORCO/VICODIN Take 1 tablet by mouth every 6 (six) hours as needed for moderate pain.   metoprolol succinate 50 MG 24 hr tablet Commonly known as:  TOPROL-XL TAKE 1 TABLET (50 MG TOTAL) BY MOUTH 2 (TWO) TIMES DAILY. TAKE WITH OR IMMEDIATELY FOLLOWING A MEAL.   pantoprazole 40 MG tablet Commonly known as:  PROTONIX TAKE 1 TABLET EVERY MORNING   Vitamin D3 5000 units Caps Take 5,000 Units by mouth daily.       No orders of the defined types were placed in this encounter.   Immunization History  Administered Date(s) Administered  . Influenza Split 01/24/2011, 02/14/2012  . Influenza Whole 01/28/2009  . Influenza,inj,Quad PF,6+ Mos 01/14/2013, 04/05/2014  . Pneumococcal Polysaccharide-23 01/28/2009  . Tdap 03/31/2014  . Zoster 01/24/2011    Social History   Tobacco Use  . Smoking status: Former Smoker    Packs/day: 1.00    Years: 40.00    Pack years: 40.00    Types: Cigarettes    Last attempt to quit: 05/02/2002    Years since quitting: 15.5  . Smokeless tobacco: Never Used  Substance Use Topics  . Alcohol use: No    Review of systems- unable to obtain secondary to confusion and a aphasia     Vitals:   11/03/17 1521  BP: 123/63  Pulse: 66  Resp: 18  Temp: (!) 97.2 F (36.2 C)  SpO2: 92%   Body mass index is 26.04 kg/m. Physical Exam  GENERAL APPEARANCE: Alert, conversant, No acute distress  SKIN: No diaphoresis rash HEENT: Pupils equal and midrange RESPIRATORY: Breathing is even, unlabored. Lung sounds are clear   CARDIOVASCULAR: Heart RRR no murmurs, rubs or gallops. No peripheral edema  GASTROINTESTINAL: Abdomen is soft, non-tender, not distended w/ normal bowel sounds.  GENITOURINARY: Bladder non tender, not distended  MUSCULOSKELETAL: No abnormal joints or  musculature NEUROLOGIC: Cranial nerves 2-12 grossly intact except confusion and a aphasia: Moves all extremities PSYCHIATRIC: Mood and affect with confusion, no behavioral issues  Patient Active Problem List   Diagnosis Date Noted  . CVA (cerebral vascular accident) ([place]) 10/25/2017  . Benign essential HTN   . History of TIA (transient ischemic attack)   . TIA (transient ischemic attack) 10/24/2017  . Cerebral infarction due to embolism of right middle cerebral artery ([place])   . HLD (hyperlipidemia)   . Acute ischemic right MCA stroke ([place]) 06/18/2014  . Facial droop   . Displaced fracture of shaft of left humerus 04/03/2014  . Right arm fracture 04/01/2014  . Displaced spiral fracture of shaft of humerus   . Peripheral arterial disease ([place]) 10/03/2013  . Chronic diastolic CHF (congestive heart failure) ([place]) 08/08/2013  . Chronic  venous insufficiency 04/02/2013  . Hypokalemia 01/13/2013  . PAF (paroxysmal atrial fibrillation) ([place]) 10/03/2012  . CAD (coronary artery disease) of artery bypass graft 04/05/2012  . Atrial fibrillation with RVR ([place]) 03/13/2012  . Need for prophylactic vaccination and inoculation against influenza 02/14/2012  . ABDOMINAL WALL HERNIA 01/21/2010  . PARESTHESIA 05/04/2009  . SKIN LESION 01/28/2009  . Nocturia 01/28/2009  . DYSPNEA 01/01/2009  . CVA (cerebral infarction) 11/17/2008  . ALLERGY 10/27/2008  . PAROXYSMAL NOCTURNAL DYSPNEA 10/03/2008  . Osteoarthrosis, unspecified whether generalized or localized, involving lower leg 09/03/2008  . RESTRICTIVE LUNG DISEASE 05/26/2008  . Anemia 01/08/2008  . UNS ADVRS EFF OTH RX MEDICINAL&BIOLOGICAL SBSTNC 11/09/2007  . HEMATOCHEZIA 07/18/2007  . PARANOIA 06/01/2007  . Anxiety state 01/23/2007  . Hypertensive heart disease with CHF ([place]) 01/23/2007  . GI BLEEDING 01/23/2007  . Osteoarthritis 01/23/2007  . DEGENERATIVE DISC DISEASE, LUMBAR SPINE 01/23/2007  . History of peptic ulcer disease 01/23/2007  .  Personal history of other diseases of digestive system 01/23/2007    CMP     Component Value Date/Time   NA 144 10/24/2017 1341   K 3.7 10/24/2017 1341   CL 104 10/24/2017 1341   CO2 31 10/24/2017 1341   GLUCOSE 102 (H) 10/24/2017 1341   BUN 11 10/24/2017 1341   CREATININE 1.00 10/24/2017 1341   CALCIUM 10.0 10/24/2017 1341   PROT 7.1 02/13/2017 1650   ALBUMIN 4.2 02/13/2017 1650   AST 23 02/13/2017 1650   ALT 13 (L) 02/13/2017 1650   ALKPHOS 72 02/13/2017 1650   BILITOT 0.7 02/13/2017 1650   GFRNONAA 49 (L) 10/24/2017 1341   GFRAA 57 (L) 10/24/2017 1341   Recent Labs    02/13/17 1650 10/24/17 1341  NA 140 144  K 5.1 3.7  CL 101 104  CO2 32 31  GLUCOSE 102* 102*  BUN 14 11  CREATININE 1.00 1.00  CALCIUM 10.0 10.0   Recent Labs    02/13/17 1650  AST 23  ALT 13*  ALKPHOS 72  BILITOT 0.7  PROT 7.1  ALBUMIN 4.2   Recent Labs    02/13/17 1650 10/24/17 1341  WBC 4.9 6.3  NEUTROABS 3.3 4.6  HGB 15.3* 14.9  HCT 46.2* 45.3  MCV 98.5 97.8  PLT 209 206   Recent Labs    10/25/17 0452  CHOL 190  LDLCALC 93  TRIG 115   No results found for: [facility] Lab Results  Component Value Date   TSH 1.539 10/24/2017   Lab Results  Component Value Date   HGBA1C 5.3 10/25/2017   Lab Results  Component Value Date   CHOL 190 10/25/2017   HDL 74 10/25/2017   LDLCALC 93 10/25/2017   LDLDIRECT 159.0 05/26/2008   TRIG 115 10/25/2017   CHOLHDL 2.6 10/25/2017    Significant Diagnostic Results in last 30 days:  Ct Head Wo Contrast  Result Date: 10/24/2017 CLINICAL DATA:  84 year old female with acute confusion and aphasia. EXAM: CT HEAD WITHOUT CONTRAST TECHNIQUE: Contiguous axial images were obtained from the base of the skull through the vertex without intravenous contrast. COMPARISON:  02/13/2017 head CT and prior studies FINDINGS: Brain: An age indeterminate RIGHT thalamic infarct vs chronic ischemic changes now noted. No hemorrhage, midline shift,  hydrocephalus, mass lesion or mass effect. Atrophy, chronic small-vessel white matter ischemic changes and remote cerebellar infarcts again noted. Vascular: Intracranial atherosclerotic calcifications again noted. Skull: Normal. Negative for fracture or focal lesion. Sinuses/Orbits: A small mucous retention cyst/polyp within the LEFT sphenoid  sinus noted. Other: None IMPRESSION: Age indeterminate RIGHT thalamic infarct vs chronic ischemic changes. Atrophy, chronic small-vessel white matter ischemic changes and remote cerebellar infarcts. Electronically Signed   By: Margarette Canada M.D.   On: 10/24/2017 15:50   Mr Brain Wo Contrast  Result Date: 10/24/2017 CLINICAL DATA:  84 y/o F; patient presenting with word-finding difficulty. History of stroke, TIA, hypertension, and atrial fibrillation. EXAM: MRI HEAD WITHOUT CONTRAST TECHNIQUE: Multiplanar, multiecho pulse sequences of the brain and surrounding structures were obtained without intravenous contrast. COMPARISON:  10/24/2017 CT head. 04/19/2015 MRI and MRA of the head. FINDINGS: Brain: 3 mm focus of reduced diffusion within the right posterior parietal cortex (series 5, image 68) compatible with acute/early subacute infarction. No associated hemorrhage or mass effect. Several small chronic infarctions are present within the cerebellum bilaterally. Small chronic lacunar infarcts in the right thalamus, left lentiform nucleus, right caudate head. Small chronic cortical infarcts of the bilateral occipital lobes 6 and right frontal lobe. Numerous patchy nonspecific foci of T2 FLAIR hyperintense signal abnormality in subcortical and periventricular white matter are compatible with advanced chronic microvascular ischemic changes for age. Moderate brain parenchymal volume loss. No new susceptibility hypointensity to indicate interval intracranial hemorrhage. Vascular: Normal flow voids. Skull and upper cervical spine: Normal marrow signal. Sinuses/Orbits: Negative. Other:  Left intra-ocular lens replacement. IMPRESSION: 1. 3 mm acute/early subacute infarction within the right posterior parietal cortex. No associated hemorrhage or mass effect. 2. Advanced chronic microvascular ischemic changes and moderate parenchymal volume loss of the brain. 3. Several small stable chronic infarctions within the cerebellum, basal ganglia, and right frontal cortex. These results will be called to the ordering clinician or representative by the Radiologist Assistant, and communication documented in the PACS or zVision Dashboard. Electronically Signed   By: Kristine Garbe M.D.   On: 10/24/2017 23:53    Assessment and Plan  Aphasia, confusion- new onset; concern for stroke primarily, versus other; routine labs labs were ordered for this morning but of course have not returned; will send to ED for evaluation    Time spent greater than 35 minutes Jacqueline Goree D. Sheppard Coil, MD

## 2017-11-03 NOTE — Progress Notes (Signed)
MEDICATION RELATED NOTE   Pharmacy Re:  Apixaban  Patient Measurements: Height: 5' (152.4 cm) Weight: 136 lb (61.7 kg) IBW/kg (Calculated) : 45.5  Estimated Creatinine Clearance: 28.5 mL/min (A) (by C-G formula based on SCr of 1.14 mg/dL (H)).  Assessment: 84 yo female admitted with altered mental status.  She is on chronic Apixaban for Afib.  This was continued for this admit.  She is borderline in the need for dose adjustment given the following:  Age > 37 yo Weight < 60KG (she is at 61kg) Creat. > 1.5 (she is at 1.14  Plan:  Given age and weight will adjust her dose to Apixaban 2.5mg  bid.  Rober Minion, PharmD., MS Clinical Pharmacist Pager:  [phone] Thank you for allowing pharmacy to be part of this patients care team. 11/03/2017,8:05 PM

## 2017-11-03 NOTE — ED Provider Notes (Signed)
[facility] Provider Note   CSN: [id number] Arrival date & time: 11/03/17  1416     History   Chief Complaint Chief Complaint  Patient presents with  . Altered Mental Status    HPI Jacqueline [name] is a 84 y.o. female.  Pt presents to the ED today with altered mental status.  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  She was d/c home on Eliquis which she takes for a.fib.  She was d/c to [facility].  She was LSN around 7 pm last night.  This morning, she has been "talking out of her head."  She is unable to give any hx.  She denies any pain.  Family said pt had a norco this am.  CHA2DS2/VAS Stroke Risk Points  Current as of 13 minutes ago     8 >= 2 Points: High Risk  1 - 1.99 Points: Medium Risk  0 Points: Low Risk    The previous score was 7 on 06/27/2017.:  Last Change:     Details    This score determines the patient's risk of having a stroke if the  patient has atrial fibrillation.       Points Metrics  1 Has Congestive Heart Failure:  Yes    Current as of 13 minutes ago  1 Has Vascular Disease:  Yes    Current as of 13 minutes ago  1 Has Hypertension:  Yes    Current as of 13 minutes ago  2 Age:  46    Current as of 13 minutes ago  0 Has Diabetes:  No    Current as of 13 minutes ago  2 Had Stroke:  Yes  Had TIA:  Yes  Had thromboembolism:  No    Current as of 13 minutes ago  1 Female:  Yes    Current as of 13 minutes ago             Past Medical History:  Diagnosis Date  . Allergy, unspecified not elsewhere classified   . Anemia, unspecified   . Anxiety state, unspecified   . Atrial fibrillation with rapid ventricular response ([place]) 03/13/12  . Blood in stool   . Complication of anesthesia    "hard to wake me up after colonoscopy" (03/13/12)  . Degeneration of lumbar or lumbosacral intervertebral disc   . GERD (gastroesophageal reflux disease)   . H/O hiatal hernia    . Hemorrhage of gastrointestinal tract, unspecified   . History of IBS   . Osteoarthrosis, unspecified whether generalized or localized, lower leg   . Other diseases of lung, not elsewhere classified   . Other dyspnea and respiratory abnormality   . Personal history of other diseases of digestive system   . Personal history of peptic ulcer disease   . Unspecified adverse effect of other drug, medicinal and biological substance(995.29)   . Unspecified cerebral artery occlusion with cerebral infarction 10/2008   "mini stroke" denies residual (03/13/12)  . Unspecified essential hypertension   . Unspecified paranoid state     Patient Active Problem List   Diagnosis Date Noted  . CVA (cerebral vascular accident) ([place]) 10/25/2017  . Benign essential HTN   . History of TIA (transient ischemic attack)   . TIA (transient ischemic attack) 10/24/2017  . Cerebral infarction due to embolism of right middle cerebral artery ([place])   . HLD (hyperlipidemia)   . Acute ischemic right MCA stroke (  [place]) 06/18/2014  . Facial droop   . Displaced fracture of shaft of left humerus 04/03/2014  . Right arm fracture 04/01/2014  . Displaced spiral fracture of shaft of humerus   . Peripheral arterial disease ([place]) 10/03/2013  . Chronic diastolic CHF (congestive heart failure) ([place]) 08/08/2013  . Chronic venous insufficiency 04/02/2013  . Hypokalemia 01/13/2013  . PAF (paroxysmal atrial fibrillation) ([place]) 10/03/2012  . CAD (coronary artery disease) of artery bypass graft 04/05/2012  . Atrial fibrillation with RVR ([place]) 03/13/2012  . Need for prophylactic vaccination and inoculation against influenza 02/14/2012  . ABDOMINAL WALL HERNIA 01/21/2010  . PARESTHESIA 05/04/2009  . SKIN LESION 01/28/2009  . Nocturia 01/28/2009  . DYSPNEA 01/01/2009  . CVA (cerebral infarction) 11/17/2008  . ALLERGY 10/27/2008  . PAROXYSMAL NOCTURNAL DYSPNEA 10/03/2008  . Osteoarthrosis, unspecified whether generalized or  localized, involving lower leg 09/03/2008  . RESTRICTIVE LUNG DISEASE 05/26/2008  . Anemia 01/08/2008  . UNS ADVRS EFF OTH RX MEDICINAL&BIOLOGICAL SBSTNC 11/09/2007  . HEMATOCHEZIA 07/18/2007  . PARANOIA 06/01/2007  . Anxiety state 01/23/2007  . Hypertensive heart disease with CHF ([place]) 01/23/2007  . GI BLEEDING 01/23/2007  . Osteoarthritis 01/23/2007  . DEGENERATIVE DISC DISEASE, LUMBAR SPINE 01/23/2007  . History of peptic ulcer disease 01/23/2007  . Personal history of other diseases of digestive system 01/23/2007    Past Surgical History:  Procedure Laterality Date  . DILATION AND CURETTAGE OF UTERUS  1950's?  . ORIF HUMERUS FRACTURE Right 04/03/2014   Procedure: OPEN REDUCTION INTERNAL FIXATION (ORIF) RIGHT HUMERUS DISTAL FRACTURE;  Surgeon: Roseanne Kaufman, MD;  Location: [facility];  Service: Orthopedics;  Laterality: Right;  . PILONIDAL CYST / SINUS EXCISION  1957  . SHOULDER ARTHROSCOPY  ~ 2000   right     OB History   None      Home Medications    Prior to Admission medications   Medication Sig Start Date End Date Taking? Authorizing Provider  ALPRAZolam (XANAX) 0.25 MG tablet Take 0.5 tablets (0.125 mg total) by mouth 2 (two) times daily as needed for anxiety. 10/28/17   Domenic Polite, MD  apixaban (ELIQUIS) 5 MG TABS tablet Take 1 tablet (5 mg total) by mouth 2 (two) times daily. 06/20/14   Regalado, Belkys A, MD  atorvastatin (LIPITOR) 40 MG tablet Take 1 tablet (40 mg total) by mouth daily at 6 PM. 10/28/17   Domenic Polite, MD  Cholecalciferol (VITAMIN D3) 5000 UNITS CAPS Take 5,000 Units by mouth daily.    [provider]  diltiazem (TIAZAC) 180 MG 24 hr capsule Take 180 mg by mouth 2 (two) times daily.  01/11/17   [provider]  HYDROcodone-acetaminophen (NORCO/VICODIN) 5-325 MG per tablet Take 1 tablet by mouth every 6 (six) hours as needed for moderate pain.    [provider]  metoprolol succinate (TOPROL-XL) 50 MG 24 hr tablet TAKE 1  TABLET (50 MG TOTAL) BY MOUTH 2 (TWO) TIMES DAILY. TAKE WITH OR IMMEDIATELY FOLLOWING A MEAL. 03/25/14   Larey Dresser, MD  Multiple Vitamins-Minerals (CENTRUM SILVER PO) Take 1 tablet by mouth daily.    [provider]  pantoprazole (PROTONIX) 40 MG tablet TAKE 1 TABLET EVERY MORNING    Hendricks Limes, MD    Family History Family History  Problem Relation Age of Onset  . Diabetes Mother   . CVA Mother   . CVA Father   . CVA Maternal Grandmother     Social History Social History   Tobacco Use  .  Smoking status: Former Smoker    Packs/day: 1.00    Years: 40.00    Pack years: 40.00    Types: Cigarettes    Last attempt to quit: 05/02/2002    Years since quitting: 15.5  . Smokeless tobacco: Never Used  Substance Use Topics  . Alcohol use: No  . Drug use: No     Allergies   Metoclopramide hcl; Penicillins; Aspirin; and Zetia [ezetimibe]   Review of Systems Review of Systems  Neurological:       Ms change  All other systems reviewed and are negative.    Physical Exam Updated Vital Signs BP 124/71 (BP Location: Left Arm)   Pulse 74   Temp 97.9 F (36.6 C) (Oral)   Resp (!) 23   Ht 5' (1.524 m)   Wt 61.7 kg (136 lb)   SpO2 93%   BMI 26.56 kg/m   Physical Exam  Constitutional: She appears well-developed and well-nourished.  HENT:  Head: Normocephalic and atraumatic.  Right Ear: External ear normal.  Left Ear: External ear normal.  Nose: Nose normal.  Mouth/Throat: Oropharynx is clear and moist.  Eyes: Pupils are equal, round, and reactive to light. Conjunctivae and EOM are normal.  Neck: Normal range of motion. Neck supple.  Cardiovascular: Normal rate, normal heart sounds and intact distal pulses. An irregularly irregular rhythm present.  Pulmonary/Chest: Effort normal and breath sounds normal.  Abdominal: Soft. Bowel sounds are normal.  Musculoskeletal: Normal range of motion.  Neurological: She is alert.  Pt knows her name and that it is  2017.  She is following commands at times.  Skin: Skin is warm. Capillary refill takes less than 2 seconds.  Psychiatric: Her speech is tangential. She is actively hallucinating.  Nursing note and vitals reviewed.    ED Treatments / Results  Labs (all labs ordered are listed, but only abnormal results are displayed) Labs Reviewed  CBC WITH DIFFERENTIAL/PLATELET - Abnormal; Notable for the following components:      Result Value   Hemoglobin 15.3 (*)    HCT 47.7 (*)    All other components within normal limits  CBG MONITORING, ED - Abnormal; Notable for the following components:   Glucose-Capillary 110 (*)    All other components within normal limits  URINALYSIS, ROUTINE W REFLEX MICROSCOPIC  COMPREHENSIVE METABOLIC PANEL    EKG EKG Interpretation  Date/Time:  Friday November 03 2017 15:36:32 EDT Ventricular Rate:  74 PR Interval:    QRS Duration: 110 QT Interval:  380 QTC Calculation: 422 R Axis:   -17 Text Interpretation:  Sinus rhythm Probable left atrial enlargement Incomplete left bundle branch block LVH with secondary repolarization abnormality Confirmed by Isla Pence [phone]) on 11/03/2017 3:39:19 PM   Radiology Dg Chest 2 View  Result Date: 11/03/2017 CLINICAL DATA:  Altered level of consciousness EXAM: CHEST - 2 VIEW COMPARISON:  February 13, 2017 FINDINGS: There is no appreciable edema or consolidation. Heart is mildly enlarged with pulmonary vascularity normal. No adenopathy. There is a sizable hiatal type hernia. There is aortic atherosclerosis. There is advanced arthropathy in both shoulders. IMPRESSION: No edema or consolidation. Stable cardiac prominence. Sizable hiatal hernia. Aortic atherosclerosis. Aortic Atherosclerosis (ICD10-I70.0). Electronically Signed   By: Lowella Grip III M.D.   On: 11/03/2017 15:34    Procedures Procedures (including critical care time)  Medications Ordered in ED Medications  sodium chloride 0.9 % bolus 1,000 mL (1,000 mLs  Intravenous New Bag/Given 11/03/17 1436)     Initial Impression /  Assessment and Plan / ED Course  I have reviewed the triage vital signs and the nursing notes.  Pertinent labs & imaging results that were available during my care of the patient were reviewed by me and considered in my medical decision making (see chart for details).   Pt was signed out to Dr. Reather Converse at shift change.   Final Clinical Impressions(s) / ED Diagnoses   Final diagnoses:  Altered mental status, unspecified altered mental status type    ED Discharge Orders    None       Isla Pence, MD 11/03/17 [phone]

## 2017-11-03 NOTE — ED Notes (Signed)
Patient transported to X-ray 

## 2017-11-03 NOTE — H&P (Signed)
History and Physical    Jacqueline [name] ENI:[id number] DOB: 01-04-1929 DOA: 11/03/2017  PCP: Lajean Manes, MD Patient coming from: [place] have personally briefly reviewed patient's old medical records in [place]  Chief Complaint: altered mental status  HPI: Jacqueline [name] is Jacqueline [name] 84 y.o. female with medical history significant of atrial fibrillation on Eliquis, hypertension, GERD, IBS, GI bleed, and recent admission for Jacqueline [name] stroke discharged on 6/29 is presenting with altered mental status.  Since being discharged after her stroke, Jacqueline [name] had been doing well.  Has recently has July 3, 1 of her family members had noted her getting up and moving around well with physical therapy.  Yesterday on July 4, her daughter noticed that she was confused and not making sense when she was speaking.  She was referring to trying to feed her little sister at dinner (saying "I could not feed the little one", turning and pointing).  Her daughter noted that later in the night and improved and her mother asked her if she was going to see the fireworks.  This morning her son noticed that she was acting differently.  He left the room and later returned and noted that she was completely out of it and confused.  Her family noted that her mental status has been waxing and waning, her speech has been difficult to understand, and she has been speaking nonsensically.  Family denies any fevers, chills, cough, cold, chest pain, trouble breathing, nausea or vomiting.  Review of systems from Jacqueline [name] is limited due to her current mental status.  ED Course: Labs, EKG, head CT, CXR essentially unremarkable.  Admit for persistent AMS.  Review of Systems: Unable to perform due to mental status.  Past Medical History:  Diagnosis Date  . Allergy, unspecified not elsewhere classified   . Anemia, unspecified   . Anxiety state, unspecified   . Atrial fibrillation with rapid ventricular response ([place]) 03/13/12  . Blood in  stool   . Complication of anesthesia    "hard to wake me up after colonoscopy" (03/13/12)  . Degeneration of lumbar or lumbosacral intervertebral disc   . GERD (gastroesophageal reflux disease)   . H/O hiatal hernia   . Hemorrhage of gastrointestinal tract, unspecified   . History of IBS   . Osteoarthrosis, unspecified whether generalized or localized, lower leg   . Other diseases of lung, not elsewhere classified   . Other dyspnea and respiratory abnormality   . Personal history of other diseases of digestive system   . Personal history of peptic ulcer disease   . Unspecified adverse effect of other drug, medicinal and biological substance(995.29)   . Unspecified cerebral artery occlusion with cerebral infarction 10/2008   "mini stroke" denies residual (03/13/12)  . Unspecified essential hypertension   . Unspecified paranoid state     Past Surgical History:  Procedure Laterality Date  . DILATION AND CURETTAGE OF UTERUS  1950's?  . ORIF HUMERUS FRACTURE Right 04/03/2014   Procedure: OPEN REDUCTION INTERNAL FIXATION (ORIF) RIGHT HUMERUS DISTAL FRACTURE;  Surgeon: Roseanne Kaufman, MD;  Location: [facility];  Service: Orthopedics;  Laterality: Right;  . PILONIDAL CYST / SINUS EXCISION  1957  . SHOULDER ARTHROSCOPY  ~ 2000   right     reports that she quit smoking about 15 years ago. Her smoking use included cigarettes. She has Jacqueline [name] 40.00 pack-year smoking history. She has never used smokeless tobacco. She reports that she does not drink alcohol or use drugs.  Allergies  Allergen Reactions  . Metoclopramide Hcl Other (See Comments)     Tremors  . Penicillins Itching    Has patient had Jacqueline [name] PCN reaction causing immediate rash, facial/tongue/throat swelling, SOB or lightheadedness with hypotension: Yes Has patient had Jacqueline [name] PCN reaction causing severe rash involving mucus membranes or skin necrosis: Yes Has patient had Jacqueline [name] PCN reaction that required hospitalization:No Has patient had Jacqueline [name] PCN reaction  occurring within the last 10 years: No If all of the above answers are "NO", then may proceed with Cephalosporin use.   . Aspirin Other (See Comments)    "gave me stomach aches; made me bleed" (03/13/12)  . Zetia [Ezetimibe] Other (See Comments)    Stomach and back pain    Family History  Problem Relation Age of Onset  . Diabetes Mother   . CVA Mother   . CVA Father   . CVA Maternal Grandmother    Prior to Admission medications   Medication Sig Start Date End Date Taking? Authorizing Provider  ALPRAZolam (XANAX) 0.25 MG tablet Take 0.5 tablets (0.125 mg total) by mouth 2 (two) times daily as needed for anxiety. 10/28/17  Yes Domenic Polite, MD  apixaban (ELIQUIS) 5 MG TABS tablet Take 1 tablet (5 mg total) by mouth 2 (two) times daily. 06/20/14  Yes Regalado, Belkys Dontrez Pettis, MD  atorvastatin (LIPITOR) 40 MG tablet Take 1 tablet (40 mg total) by mouth daily at 6 PM. 10/28/17  Yes Domenic Polite, MD  bisacodyl (DULCOLAX) 10 MG suppository Place 10 mg rectally daily as needed (constipation not relieved by MOM).   Yes [provider]  Cholecalciferol (VITAMIN D3) 5000 UNITS CAPS Take 5,000 Units by mouth daily.   Yes [provider]  diltiazem (TIAZAC) 180 MG 24 hr capsule Take 180 mg by mouth 2 (two) times daily.  01/11/17  Yes [provider]  HYDROcodone-acetaminophen (NORCO/VICODIN) 5-325 MG per tablet Take 1 tablet by mouth every 6 (six) hours as needed for moderate pain.   Yes [provider]  hypromellose (SYSTANE OVERNIGHT THERAPY) 0.3 % GEL ophthalmic ointment Place 1 application into both eyes 4 (four) times daily as needed for dry eyes.   Yes [provider]  Magnesium Hydroxide (MILK OF MAGNESIA PO) Take 30 mLs by mouth daily as needed (if no BM in 3 days).   Yes [provider]  metoprolol succinate (TOPROL-XL) 50 MG 24 hr tablet TAKE 1 TABLET (50 MG TOTAL) BY MOUTH 2 (TWO) TIMES DAILY. TAKE WITH OR IMMEDIATELY FOLLOWING Bishoy Cupp  MEAL. Patient taking differently: Take 50 mg by mouth 2 (two) times daily with Jacqueline [name] meal.  03/25/14  Yes Larey Dresser, MD  Multiple Vitamin (MULTIVITAMIN WITH MINERALS) TABS tablet Take 1 tablet by mouth daily. Centrum Silver   Yes [provider]  pantoprazole (PROTONIX) 40 MG tablet TAKE 1 TABLET EVERY MORNING Patient taking differently: Take 40 mg by mouth daily.    Yes Hendricks Limes, MD  senna (SENOKOT) 8.6 MG TABS tablet Take 1 tablet by mouth 2 (two) times daily.   Yes [provider]  Sodium Phosphates (FLEET ENEMA RE) Place 1 enema rectally daily as needed (constipation not relieved by bisacodyl suppository).   Yes [provider]    Physical Exam: Vitals:   11/03/17 1530 11/03/17 1742 11/03/17 1800 11/03/17 2001  BP: 103/69 (!) 152/119 (!) 147/120 (!) 164/111  Pulse: 72 71 74 70  Resp: 17 20 (!) 21 18  Temp:    98.5 F (36.9 C)  TempSrc:  Oral  SpO2: 94% 98% 93% 96%  Weight:      Height:        Constitutional: Laying on bed with staring to side with mouth open, no acute disctress, mumbling nonsensically Vitals:   11/03/17 1530 11/03/17 1742 11/03/17 1800 11/03/17 2001  BP: 103/69 (!) 152/119 (!) 147/120 (!) 164/111  Pulse: 72 71 74 70  Resp: 17 20 (!) 21 18  Temp:    98.5 F (36.9 C)  TempSrc:    Oral  SpO2: 94% 98% 93% 96%  Weight:      Height:       Eyes: PERRL, lids and conjunctivae normal ENMT: Mucous membranes are moist. Posterior pharynx clear of any exudate or lesions.Normal dentition.  Neck: normal, supple, no masses, no thyromegaly Respiratory: clear to auscultation bilaterally, no wheezing, no crackles. Normal respiratory effort. No accessory muscle use.  Cardiovascular: Regular rate and rhythm, no murmurs / rubs / gallops. Trace extremity edema. 2+ pedal pulses. No carotid bruits.  Abdomen: no tenderness, no masses palpated. No hepatosplenomegaly.  Musculoskeletal: no clubbing / cyanosis. No joint deformity upper and  lower extremities. Good ROM, no contractures. Normal muscle tone.  Skin: Abrasion to LLE, no sacral decubitus ulcer visualized Neurologic: Difficult to perform as she was distractible and followed commands inconsistently, but CN 2-12 grossly intact. Strength was symmetric in all extremities and she was moving all extremities.  Mild tremor. Psychiatric:  Alert and oriented x 1. Normal mood.   Labs on Admission: I have personally reviewed following labs and imaging studies  CBC: Recent Labs  Lab 11/03/17 1432  WBC 7.4  NEUTROABS 5.0  HGB 15.3*  HCT 47.7*  MCV 99.0  PLT 938   Basic Metabolic Panel: Recent Labs  Lab 11/03/17 1432  NA 140  K 4.6  CL 106  CO2 23  GLUCOSE 109*  BUN 26*  CREATININE 1.14*  CALCIUM 10.2   GFR: Estimated Creatinine Clearance: 28.5 mL/min (Jessyca Sloan) (by C-G formula based on SCr of 1.14 mg/dL (H)). Liver Function Tests: Recent Labs  Lab 11/03/17 1432  AST 28  ALT 15  ALKPHOS 77  BILITOT 1.4*  PROT 6.9  ALBUMIN 3.7   No results for input(s): LIPASE, AMYLASE in the last 168 hours. No results for input(s): AMMONIA in the last 168 hours. Coagulation Profile: No results for input(s): INR, PROTIME in the last 168 hours. Cardiac Enzymes: No results for input(s): CKTOTAL, CKMB, CKMBINDEX, TROPONINI in the last 168 hours. BNP (last 3 results) No results for input(s): PROBNP in the last 8760 hours. HbA1C: No results for input(s): HGBA1C in the last 72 hours. CBG: Recent Labs  Lab 11/03/17 1431  GLUCAP 110*   Lipid Profile: No results for input(s): CHOL, HDL, LDLCALC, TRIG, CHOLHDL, LDLDIRECT in the last 72 hours. Thyroid Function Tests: No results for input(s): TSH, T4TOTAL, FREET4, T3FREE, THYROIDAB in the last 72 hours. Anemia Panel: No results for input(s): VITAMINB12, FOLATE, FERRITIN, TIBC, IRON, RETICCTPCT in the last 72 hours. Urine analysis:    Component Value Date/Time   COLORURINE YELLOW 11/03/2017 1457   APPEARANCEUR CLEAR  11/03/2017 1457   LABSPEC 1.015 11/03/2017 1457   PHURINE 5.0 11/03/2017 1457   GLUCOSEU NEGATIVE 11/03/2017 1457   HGBUR NEGATIVE 11/03/2017 1457   BILIRUBINUR NEGATIVE 11/03/2017 [place] 11/03/2017 1457   PROTEINUR NEGATIVE 11/03/2017 1457   UROBILINOGEN 0.2 06/18/2014 1950   NITRITE NEGATIVE 11/03/2017 1457   LEUKOCYTESUR NEGATIVE 11/03/2017 1457    Radiological Exams on Admission:  Dg Chest 2 View  Result Date: 11/03/2017 CLINICAL DATA:  Altered level of consciousness EXAM: CHEST - 2 VIEW COMPARISON:  February 13, 2017 FINDINGS: There is no appreciable edema or consolidation. Heart is mildly enlarged with pulmonary vascularity normal. No adenopathy. There is Arnette Driggs sizable hiatal type hernia. There is aortic atherosclerosis. There is advanced arthropathy in both shoulders. IMPRESSION: No edema or consolidation. Stable cardiac prominence. Sizable hiatal hernia. Aortic atherosclerosis. Aortic Atherosclerosis (ICD10-I70.0). Electronically Signed   By: Lowella Grip III M.D.   On: 11/03/2017 15:34   Ct Head Wo Contrast  Result Date: 11/03/2017 CLINICAL DATA:  Altered mental status EXAM: CT HEAD WITHOUT CONTRAST TECHNIQUE: Contiguous axial images were obtained from the base of the skull through the vertex without intravenous contrast. COMPARISON:  10/24/2017 FINDINGS: Brain: There is atrophy and chronic small vessel disease changes. No acute intracranial abnormality. Specifically, no hemorrhage, hydrocephalus, mass lesion, acute infarction, or significant intracranial injury. Vascular: No hyperdense vessel or unexpected calcification. Skull: No acute calvarial abnormality. Sinuses/Orbits: Visualized paranasal sinuses and mastoids clear. Orbital soft tissues unremarkable. Other: None IMPRESSION: No acute intracranial abnormality. Atrophy, chronic microvascular disease. Electronically Signed   By: Rolm Baptise M.D.   On: 11/03/2017 16:14   Mr Brain Wo Contrast  Result Date:  11/03/2017 CLINICAL DATA:  Altered mental status. EXAM: MRI HEAD WITHOUT CONTRAST TECHNIQUE: Multiplanar, multiecho pulse sequences of the brain and surrounding structures were obtained without intravenous contrast. COMPARISON:  Head CT 11/03/2017 and MRI 10/24/2017 FINDINGS: Some sequences are moderately to severely motion degraded. Brain: No acute infarct, mass, midline shift, or extra-axial fluid collection is identified. Diffusion signal abnormality associated with the punctate acute right parietal infarct on the prior MRI has resolved. There is Carlethia Mesquita chronic microhemorrhage in the pons. Moderate cerebral atrophy is again noted. Small chronic cortical infarcts are again seen in the occipital lobes and posterior right frontal lobe. Small chronic infarcts are also again noted in the bilateral basal ganglia, right thalamus, and cerebellum bilaterally. Patchy T2 hyperintensities throughout the cerebral white matter and pons are similar to the prior MRI and nonspecific but compatible with moderate chronic small vessel ischemic disease. Vascular: Major intracranial vascular flow voids are preserved. Skull and upper cervical spine: No suspicious marrow lesion identified. Sinuses/Orbits: Left cataract extraction. Small left sphenoid sinus fluid level. Clear mastoid air cells. Other: None. IMPRESSION: 1. Motion degraded examination without evidence of acute intracranial abnormality. 2. Moderate chronic small vessel ischemic disease and cerebral atrophy. Electronically Signed   By: Logan Bores M.D.   On: 11/03/2017 19:43    EKG: Independently reviewed. Poor tracing, but appears similar to priors.  NSR, no ST-T wave changes concerning for ischemia.  Assessment/Plan Active Problems:   Altered mental status  Acute Encephalopathy:  Unclear etiology at this point in time, started 7/4.  Improving per family on my exam (following commands and interacting), but not at baseline and still very confused with dysarthria and  some nonsensical speech as well. Broad differential at this point No evidence of infection.  UA clean.  CXR without acute findings. Head CT without acute abnormality  MRI brain motion degraded, but no acute abnormality CMP and CBC unremarkable Follow VBG, ammonia, B12, TSH, utox Follow EEG Hold xanax for now, but sounds like she takes this somewhat regularly (will need to resume as able) Hold norco (apparently she had this this morning, but confusion started tonight, lower suspicion for this med as etiology) As MRI negative for stroke, will continue to monitor for now,  discuss with neuro if persistent Speech eval PT/OT  History of Stroke: recently admitted for stroke, d/c'd on 6/29 Continue eliquis (pharm adjusted dose, but with recent stroke on this dose, will likely resume full dose, will discuss with pharmacy in AM) Of note, family states she doesn't tolerate statins, but she was discharged on this (will discuss more tomorrow), for now, holding lipitor  Hypertension: metoprolol, diltiazem  Atrial fibrillation: metop, dilt, eliquis.  Follow tele. Repeat EKG given poor baseline.   Anxiety: holding xanax for now, possibly resetart tomorrow  DVT prophylaxis: eliquis Code Status: DNR, confirmed with family Family Communication: sons, daughters, daughter in law at bedside Disposition Plan: pending improvement  Consults called: none Admission status: observation    Fayrene Helper MD [facility] Pager [phone]  If 7PM-7AM, please contact night-coverage [email or web address] Password [email or web address]  11/03/2017, 8:30 PM

## 2017-11-03 NOTE — ED Notes (Signed)
Patient transported to MRI 

## 2017-11-03 NOTE — ED Notes (Signed)
Patient transported to CT 

## 2017-11-03 NOTE — ED Notes (Signed)
CBG 110. 

## 2017-11-04 ENCOUNTER — Observation Stay (HOSPITAL_COMMUNITY): Payer: Medicare Other

## 2017-11-04 ENCOUNTER — Encounter: Payer: Self-pay | Admitting: Internal Medicine

## 2017-11-04 DIAGNOSIS — R4182 Altered mental status, unspecified: Secondary | ICD-10-CM | POA: Diagnosis not present

## 2017-11-04 DIAGNOSIS — F0391 Unspecified dementia with behavioral disturbance: Secondary | ICD-10-CM

## 2017-11-04 DIAGNOSIS — K219 Gastro-esophageal reflux disease without esophagitis: Secondary | ICD-10-CM | POA: Insufficient documentation

## 2017-11-04 LAB — BASIC METABOLIC PANEL
ANION GAP: 11 (ref 5–15)
BUN: 17 mg/dL (ref 8–23)
CHLORIDE: 106 mmol/L (ref 98–111)
CO2: 25 mmol/L (ref 22–32)
Calcium: 9.9 mg/dL (ref 8.9–10.3)
Creatinine, Ser: 0.86 mg/dL (ref 0.44–1.00)
GFR calc Af Amer: >60 mL/min (ref >60)
GFR, EST NON AFRICAN AMERICAN: 59 mL/min — AB (ref >60)
GLUCOSE: 90 mg/dL (ref 70–99)
Potassium: 3.6 mmol/L (ref 3.5–5.1)
Sodium: 142 mmol/L (ref 135–145)

## 2017-11-04 LAB — RAPID URINE DRUG SCREEN, HOSP PERFORMED
Amphetamines: NOT DETECTED
Benzodiazepines: NOT DETECTED
Cocaine: NOT DETECTED
OPIATES: NOT DETECTED
TETRAHYDROCANNABINOL: NOT DETECTED

## 2017-11-04 LAB — LIPID PANEL
CHOL/HDL RATIO: 2.3 ratio
CHOLESTEROL: 144 mg/dL (ref 0–200)
HDL: 63 mg/dL (ref >40)
LDL Cholesterol: 67 mg/dL (ref 0–99)
Triglycerides: 72 mg/dL (ref <150)
VLDL: 14 mg/dL (ref 0–40)

## 2017-11-04 LAB — HEMOGLOBIN A1C
HEMOGLOBIN A1C: 5.2 % (ref 4.8–5.6)
MEAN PLASMA GLUCOSE: 102.54 mg/dL

## 2017-11-04 LAB — CBC
HCT: 48.4 % — ABNORMAL HIGH (ref 36.0–46.0)
HEMOGLOBIN: 16.1 g/dL — AB (ref 12.0–15.0)
MCH: 32.5 pg (ref 26.0–34.0)
MCHC: 33.3 g/dL (ref 30.0–36.0)
MCV: 97.6 fL (ref 78.0–100.0)
PLATELETS: 225 10*3/uL (ref 150–400)
RBC: 4.96 MIL/uL (ref 3.87–5.11)
RDW: 13.2 % (ref 11.5–15.5)
WBC: 7.9 10*3/uL (ref 4.0–10.5)

## 2017-11-04 LAB — MAGNESIUM: MAGNESIUM: 2 mg/dL (ref 1.7–2.4)

## 2017-11-04 MED ORDER — DONEPEZIL HCL 5 MG PO TABS
5.0000 mg | ORAL_TABLET | Freq: Every day | ORAL | Status: DC
  Administered 2017-11-04 – 2017-11-06 (×3): 5 mg via ORAL
  Filled 2017-11-04 (×3): qty 1

## 2017-11-04 MED ORDER — POTASSIUM CHLORIDE 20 MEQ PO PACK
40.0000 meq | PACK | Freq: Once | ORAL | Status: AC
  Administered 2017-11-04: 40 meq via ORAL
  Filled 2017-11-04: qty 2

## 2017-11-04 MED ORDER — ALPRAZOLAM 0.25 MG PO TABS
0.1250 mg | ORAL_TABLET | Freq: Two times a day (BID) | ORAL | Status: DC | PRN
  Administered 2017-11-05 (×2): 0.125 mg via ORAL
  Filled 2017-11-04 (×3): qty 1

## 2017-11-04 MED ORDER — APIXABAN 5 MG PO TABS
5.0000 mg | ORAL_TABLET | Freq: Two times a day (BID) | ORAL | Status: DC
  Administered 2017-11-04 – 2017-11-07 (×6): 5 mg via ORAL
  Filled 2017-11-04 (×6): qty 1

## 2017-11-04 MED ORDER — DILTIAZEM HCL 30 MG PO TABS
60.0000 mg | ORAL_TABLET | Freq: Three times a day (TID) | ORAL | Status: DC
  Administered 2017-11-05 – 2017-11-07 (×7): 60 mg via ORAL
  Filled 2017-11-04 (×2): qty 1
  Filled 2017-11-04: qty 2
  Filled 2017-11-04: qty 1
  Filled 2017-11-04 (×4): qty 2

## 2017-11-04 MED ORDER — METOPROLOL TARTRATE 25 MG PO TABS
25.0000 mg | ORAL_TABLET | Freq: Two times a day (BID) | ORAL | Status: DC
  Administered 2017-11-04 – 2017-11-07 (×7): 25 mg via ORAL
  Filled 2017-11-04 (×6): qty 1

## 2017-11-04 NOTE — Evaluation (Signed)
Occupational Therapy Evaluation Patient Details Name: Jacqueline [name] MRN: [id number] DOB: Sep 26, 1928 Today's Date: 11/04/2017    History of Present Illness 84 y.o. female with medical history significant of atrial fibrillation on Eliquis, hypertension, GERD, IBS, GI bleed, and recent admission for a stroke discharged on 6/29 is presenting with altered mental status.   Clinical Impression   Per chart review, pt was at [facility] for rehab after CVA on June 2019 requiring assistance for ADLs and functional mobility PTA. Pt currently requires Total A for ADLs and bed mobility and Max A +2 for sit<>stand transfer. Pt presenting with decreased orientation and cognition impacting her occupational performance and participation. Pt requiring Max visual and verbal cues for eye contact and only holding for 1-2 seconds. Pt with tangential speech throughout session and poor attention. Pt would benefit from further acute OT to facilitate safe dc. Recommend dc to SNF for further OT to optimize safety, independence with ADLs, and return to PLOF.      Follow Up Recommendations  SNF;Supervision/Assistance - 24 hour    Equipment Recommendations  Other (comment)(Defer to next venue)    Recommendations for Other Services PT consult;Speech consult     Precautions / Restrictions Precautions Precautions: (P) Fall Restrictions Weight Bearing Restrictions: No      Mobility Bed Mobility Overal bed mobility: (P) Needs Assistance Bed Mobility: (P) Supine to Sit;Sit to Supine     Supine to sit: (P) Total assist;+2 for physical assistance Sit to supine: (P) Total assist;+2 for physical assistance   General bed mobility comments: (P) Pt with decreased initiation and requiring total A to perform bed mobility.  Transfers Overall transfer level: (P) Needs assistance Equipment used: (P) 2 person hand held assist Transfers: (P) Sit to/from Stand Sit to Stand: (P) Max assist;+2 physical assistance          General transfer comment: (P) Max A for initation into standing. Pt requiring +2 for safety and with difficulty following cues for sit<>Stand. In standing, pt maintaining flexed knees    Balance Overall balance assessment: (P) Needs assistance;History of Falls Sitting-balance support: (P) No upper extremity supported;Feet supported Sitting balance-Leahy Scale: (P) Fair Sitting balance - Comments: (P) Initially requiring MaxA due to posterior lean and pushing backwards. Requiring tactile cues and time to reduce pushing back. Pt transitioning to sitting at EOB with MIn Guard A +2. Postural control: (P) Posterior lean Standing balance support: (P) Bilateral upper extremity supported;During functional activity Standing balance-Leahy Scale: (P) Poor Standing balance comment: (P) Reliant on physical A for balance                           ADL either performed or assessed with clinical judgement   ADL Overall ADL's : Needs assistance/impaired                                       General ADL Comments: Pt requiring total A for all ADLs and bed mobility. Pt unable to follow commands. Providing pt with tooth brush which pt was able to state "tooth brush" and then resumes tangential "babbling". Pt presenting with increased attention when donning right sock. Pt stating "hold on one minute" when therapist donning right socks. Pt then reached out to engage in task and grab sock. During reaching tasks, pt overshooting to right.     Vision   Additional Comments: Noted overshooting  to right whenever pt reached out for objects. Difficult to fully assess due to cognition.     Perception     Praxis      Pertinent Vitals/Pain Pain Assessment: Faces Faces Pain Scale: (P) Hurts little more Pain Location: (P) Grimacing with touch of right foot Pain Descriptors / Indicators: (P) Discomfort;Grimacing Pain Intervention(s): Monitored during session;Limited activity within  patient's tolerance;Repositioned     Hand Dominance (P) Right   Extremity/Trunk Assessment Upper Extremity Assessment Upper Extremity Assessment: Generalized weakness   Lower Extremity Assessment Lower Extremity Assessment: Generalized weakness   Cervical / Trunk Assessment Cervical / Trunk Assessment: (P) Kyphotic   Communication Communication Communication: (P) Receptive difficulties;Expressive difficulties;Other (comment)(Continuous "babbling")   Cognition Arousal/Alertness: (P) Awake/alert Behavior During Therapy: (P) Restless Overall Cognitive Status: (P) No family/caregiver present to determine baseline cognitive functioning Area of Impairment: (P) Orientation;Attention;Memory;Following commands;Safety/judgement;Awareness;Problem solving                 Orientation Level: (P) Disoriented to;Person;Place;Time;Situation Current Attention Level: (P) Focused Memory: (P) Decreased recall of precautions;Decreased short-term memory Following Commands: (P) Follows one step commands inconsistently Safety/Judgement: (P) Decreased awareness of safety;Decreased awareness of deficits Awareness: (P) Intellectual Problem Solving: (P) Slow processing;Decreased initiation;Difficulty sequencing;Requires verbal cues;Requires tactile cues General Comments: (P) Upon arrival and throughout session, pt "babbling". Tangential speech throughout session with no following of cues. Pt requiring Max visual and verbal cues for eye contact and then only holding ofr 1-2 seconds. Pt presenting with poor attention and not engaging in any ADL task oresented to her. During donning right socks, pt focusing to tell therapist to "hold on one mintue" and reached out to grab sock. This was only time during session where pt focused on one topic.    General Comments       Exercises     Shoulder Instructions      Home Living Family/patient expects to be discharged to:: (P) Skilled nursing facility                                  Additional Comments: (P) Pt recently at [facility] for [place] rehab post CVA,.      Prior Functioning/Environment Level of Independence: (P) Needs assistance        Comments: (P) No family present to provide information. Per chart review, pt requiring assistance for ADLs and functional mobility.        OT Problem List: Decreased strength;Decreased range of motion;Decreased activity tolerance;Impaired balance (sitting and/or standing);Impaired vision/perception;Decreased coordination;Decreased cognition;Decreased safety awareness;Decreased knowledge of use of DME or AE;Decreased knowledge of precautions;Impaired UE functional use      OT Treatment/Interventions: Self-care/ADL training;Therapeutic exercise;Energy conservation;DME and/or AE instruction;Therapeutic activities;Patient/family education    OT Goals(Current goals can be found in the care plan section) Acute Rehab OT Goals Patient Stated Goal: (P) unstated OT Goal Formulation: With patient Time For Goal Achievement: 11/18/17 Potential to Achieve Goals: Good ADL Goals Pt Will Perform Grooming: (P) with min assist;sitting(Min cues) Pt Will Perform Upper Body Dressing: (P) with min assist;sitting(Mod cues)  OT Frequency: Min 2X/week   Barriers to D/C:            Co-evaluation              AM-PAC PT "6 Clicks" Daily Activity     Outcome Measure Help from another person eating meals?: Total Help from another person taking care of personal grooming?: Total Help from another  person toileting, which includes using toliet, bedpan, or urinal?: Total Help from another person bathing (including washing, rinsing, drying)?: Total Help from another person to put on and taking off regular upper body clothing?: Total Help from another person to put on and taking off regular lower body clothing?: Total 6 Click Score: 6   End of Session Equipment Utilized During Treatment: Gait belt Nurse  Communication: Mobility status  Activity Tolerance: Other (comment)(Limited by decreased cognition) Patient left: in bed;with call bell/phone within reach;with bed alarm set  OT Visit Diagnosis: Unsteadiness on feet (R26.81);Other abnormalities of gait and mobility (R26.89);Muscle weakness (generalized) (M62.81);Other symptoms and signs involving cognitive function                Time: 7322-0254 OT Time Calculation (min): 20 min Charges:  OT General Charges $OT Visit: 1 Visit OT Evaluation $OT Eval Moderate Complexity: 1 Mod G-Codes:     [place] MSOT, OTR/L Acute Rehab Pager: [phone] Office: [place] 11/04/2017, 9:39 AM

## 2017-11-04 NOTE — Procedures (Signed)
Date of recording 11/04/2017  Referring physician [place]  Reason for the study Altered mental status  Technical Digital EEG recording using 10-20 international electrode system  Description of the recording Posterior dominant rhythm is 6-7 Hz reactive bilaterally. Intermittent generalized delta slowing Epileptiform features were not seen. Sleep architecture were not seen  Impression The EEG is abnormal and findings are suggestive of mild to moderate generalized cerebral dysfunction.  Epileptiform features were not seen during this recording.

## 2017-11-04 NOTE — Progress Notes (Signed)
Tele called and reported that patient had a run of 15 beats of v tach. MD paged to notify. Awaiting a call back

## 2017-11-04 NOTE — Discharge Instructions (Signed)

## 2017-11-04 NOTE — Consult Note (Signed)
NEURO HOSPITALIST CONSULT NOTE   Requestig physician: Dr. Florene Glen  Reason for Consult:AMS  History obtained from:  Patient, Chart and Daughter  HPI:                                                                                                                                         Jacqueline [name] is an 84 y.o. female with [place] CVA 2014 and 2019 (medial cerebellar, then 2019 with right parietal punctate stroke) and TIA 06/08/2014, HTN, afib with rvr on eliquis presenting to [facility] for AMS.  Per daughter on Friday 11/03/17 they went to visit her at her nursing facility and she was not acting right. Within the hour she became more confused and was not making sense. Daughter reports that here is some question as to whether or not the nursing facility gave her narco yesterday at 7am.  They brought her to [place] for AMS and she continue to have AMS. She waxes and wanes between gibberish and being able to form sentences. Per daughters report patient did not sleep at all last night. She was awake and speaking gibberish all night.   Hospital course: CT head yesterday showed no intracranial abnormality. Atrophy and chronic microvascular disease. MRI brain wo contrast was a motion degraded examination without evidence of acute intracranial abnormality; moderate chronic small vessel ischemic disease and cerebral atrophy were noted. Ammonia level normal, UA: normal, TSH: normal, b12: normal. VBG: pending.    Past Medical History:  Diagnosis Date  . Allergy, unspecified not elsewhere classified   . Anemia, unspecified   . Anxiety state, unspecified   . Atrial fibrillation with rapid ventricular response ([place]) 03/13/12  . Blood in stool   . Complication of anesthesia    "hard to wake me up after colonoscopy" (03/13/12)  . Degeneration of lumbar or lumbosacral intervertebral disc   . GERD (gastroesophageal reflux disease)   . H/O hiatal hernia   . Hemorrhage of gastrointestinal tract,  unspecified   . History of IBS   . Osteoarthrosis, unspecified whether generalized or localized, lower leg   . Other diseases of lung, not elsewhere classified   . Other dyspnea and respiratory abnormality   . Personal history of other diseases of digestive system   . Personal history of peptic ulcer disease   . Unspecified adverse effect of other drug, medicinal and biological substance(995.29)   . Unspecified cerebral artery occlusion with cerebral infarction 10/2008   "mini stroke" denies residual (03/13/12)  . Unspecified essential hypertension   . Unspecified paranoid state     Past Surgical History:  Procedure Laterality Date  . DILATION AND CURETTAGE OF UTERUS  1950's?  . ORIF HUMERUS FRACTURE Right 04/03/2014   Procedure: OPEN REDUCTION INTERNAL FIXATION (ORIF) RIGHT HUMERUS DISTAL FRACTURE;  Surgeon: Gwyndolyn Saxon  Amedeo Plenty, MD;  Location: [facility];  Service: Orthopedics;  Laterality: Right;  . PILONIDAL CYST / SINUS EXCISION  1957  . SHOULDER ARTHROSCOPY  ~ 2000   right    Family History  Problem Relation Age of Onset  . Diabetes Mother   . CVA Mother   . CVA Father   . CVA Maternal Grandmother         Social History:  reports that she quit smoking about 15 years ago. Her smoking use included cigarettes. She has a 40.00 pack-year smoking history. She has never used smokeless tobacco. She reports that she does not drink alcohol or use drugs.  Allergies  Allergen Reactions  . Metoclopramide Hcl Other (See Comments)     Tremors  . Penicillins Itching    Has patient had a PCN reaction causing immediate rash, facial/tongue/throat swelling, SOB or lightheadedness with hypotension: Yes Has patient had a PCN reaction causing severe rash involving mucus membranes or skin necrosis: Yes Has patient had a PCN reaction that required hospitalization:No Has patient had a PCN reaction occurring within the last 10 years: No If all of the above answers are "NO", then may proceed with  Cephalosporin use.   . Aspirin Other (See Comments)    "gave me stomach aches; made me bleed" (03/13/12)  . Zetia [Ezetimibe] Other (See Comments)    Stomach and back pain    MEDICATIONS:                                                                                                                     Scheduled: .  stroke: mapping our early stages of recovery book   Does not apply Once  . apixaban  5 mg Oral BID  . diltiazem  180 mg Oral BID  . metoprolol tartrate  25 mg Oral BID  . pantoprazole  40 mg Oral Daily   Continuous:  KYH:[id number] **OR** acetaminophen (TYLENOL) oral liquid 160 mg/5 mL **OR** acetaminophen   ROS:                                                                                                                                       History   unobtainable from patient due to mental status  Blood pressure 125/77, pulse (!) 102, temperature 97.7 F (36.5 C), temperature source Oral, resp. rate 15, height 5' (1.524 m), weight 61.7 kg (136 lb), SpO2 96 %.  General Examination:                                                                                                       Physical Exam  HEENT-  Normocephalic, no lesions, without obvious abnormality.  Normal external eye and conjunctiva.   Cardiovascular- S1-S2 audible, pulses palpable throughout   Lungs-no rhonchi or wheezing noted, no excessive working breathing.  Saturations within normal limits on RA Abdomen- All 4 quadrants palpated and nontender Musculoskeletal-bilateral feet contractures. Skin-warm and dry,intact  Neurological Examination (limited d/t AMS) Mental Status: Alert, not oriented.  Speech fluent on initial NP exam, at which time the patient was not able to follow commands. She was not recognize family members. She was carrying on a conversation with nonexistent family members (mother in law). When NP tested her reflexes she stated " ouch, don't do that", then continued to talk to  her mother in law who she apparently imagines to be present. On follow up attending examination, the patient exhibited word salad which was slurred and agrammatical, but rapid and superficially fluent-sounding. The patient was not able to follow commands or answer any questions during the attending examination, but maintained eye contact and continued to converse nonsensically. Cranial Nerves: Patient able to move eyes and track horizontally. PERRL Motor/ Sensory:  Patient able to move all 4 extremities spontaneously. Bilateral feet contractures Cogwheel rigidity of bilateral upper extremities is present.  Deep Tendon Reflexes: 2+ biceps and patellae Plantars: Right: downgoing   Left: downgoing Cerebellar: Did not follow commands.  Gait: Deferred due to confusion and falls risk concerns    Lab Results: Basic Metabolic Panel: Recent Labs  Lab 11/03/17 1432 11/04/17 0411  NA 140 142  K 4.6 3.6  CL 106 106  CO2 23 25  GLUCOSE 109* 90  BUN 26* 17  CREATININE 1.14* 0.86  CALCIUM 10.2 9.9  MG  --  2.0    CBC: Recent Labs  Lab 11/03/17 1432 11/04/17 0411  WBC 7.4 7.9  NEUTROABS 5.0  --   HGB 15.3* 16.1*  HCT 47.7* 48.4*  MCV 99.0 97.6  PLT 260 225    Cardiac Enzymes: No results for input(s): CKTOTAL, CKMB, CKMBINDEX, TROPONINI in the last 168 hours.  Lipid Panel: Recent Labs  Lab 11/04/17 0411  CHOL 144  TRIG 72  HDL 63  CHOLHDL 2.3  VLDL 14  LDLCALC 67    Imaging: Dg Chest 2 View  Result Date: 11/03/2017 CLINICAL DATA:  Altered level of consciousness EXAM: CHEST - 2 VIEW COMPARISON:  February 13, 2017 FINDINGS: There is no appreciable edema or consolidation. Heart is mildly enlarged with pulmonary vascularity normal. No adenopathy. There is a sizable hiatal type hernia. There is aortic atherosclerosis. There is advanced arthropathy in both shoulders. IMPRESSION: No edema or consolidation. Stable cardiac prominence. Sizable hiatal hernia. Aortic atherosclerosis.  Aortic Atherosclerosis (ICD10-I70.0). Electronically Signed   By: Lowella Grip III M.D.   On: 11/03/2017 15:34   Ct Head Wo Contrast  Result Date: 11/03/2017 CLINICAL DATA:  Altered mental status EXAM: CT HEAD WITHOUT CONTRAST  TECHNIQUE: Contiguous axial images were obtained from the base of the skull through the vertex without intravenous contrast. COMPARISON:  10/24/2017 FINDINGS: Brain: There is atrophy and chronic small vessel disease changes. No acute intracranial abnormality. Specifically, no hemorrhage, hydrocephalus, mass lesion, acute infarction, or significant intracranial injury. Vascular: No hyperdense vessel or unexpected calcification. Skull: No acute calvarial abnormality. Sinuses/Orbits: Visualized paranasal sinuses and mastoids clear. Orbital soft tissues unremarkable. Other: None IMPRESSION: No acute intracranial abnormality. Atrophy, chronic microvascular disease. Electronically Signed   By: Rolm Baptise M.D.   On: 11/03/2017 16:14   Mr Brain Wo Contrast  Result Date: 11/03/2017 CLINICAL DATA:  Altered mental status. EXAM: MRI HEAD WITHOUT CONTRAST TECHNIQUE: Multiplanar, multiecho pulse sequences of the brain and surrounding structures were obtained without intravenous contrast. COMPARISON:  Head CT 11/03/2017 and MRI 10/24/2017 FINDINGS: Some sequences are moderately to severely motion degraded. Brain: No acute infarct, mass, midline shift, or extra-axial fluid collection is identified. Diffusion signal abnormality associated with the punctate acute right parietal infarct on the prior MRI has resolved. There is a chronic microhemorrhage in the pons. Moderate cerebral atrophy is again noted. Small chronic cortical infarcts are again seen in the occipital lobes and posterior right frontal lobe. Small chronic infarcts are also again noted in the bilateral basal ganglia, right thalamus, and cerebellum bilaterally. Patchy T2 hyperintensities throughout the cerebral white matter and pons  are similar to the prior MRI and nonspecific but compatible with moderate chronic small vessel ischemic disease. Vascular: Major intracranial vascular flow voids are preserved. Skull and upper cervical spine: No suspicious marrow lesion identified. Sinuses/Orbits: Left cataract extraction. Small left sphenoid sinus fluid level. Clear mastoid air cells. Other: None. IMPRESSION: 1. Motion degraded examination without evidence of acute intracranial abnormality. 2. Moderate chronic small vessel ischemic disease and cerebral atrophy. Electronically Signed   By: Logan Bores M.D.   On: 11/03/2017 19:43   EEG: Description of the recording Posterior dominant rhythm is 6-7 Hz reactive bilaterally. Intermittent generalized delta slowing Epileptiform features were not seen. Sleep architecture were not seen  Impression The EEG is abnormal and findings are suggestive of mild to moderate generalized cerebral dysfunction.  Epileptiform features were not seen during this recording.            History and examination documented by Laurey Morale, MSN, NP-C, [facility] [phone]  Assessment: 84 y.o. female with PMH of CVA and TIA, HTN and atrial fibrillation on Eliquis, who presents with waxing and waning expressive aphasia and confusion. 1. EEG shows no electrographic seizures; intermittent slowing is noted. No clinical seizures observed.  2. MRI brain shows no acute infarction.  3. Overall clinical picture most consistent with acute delirium versus cognitive fluctuation in the setting of dementia. Given her apparent visual hallucination of the non-existent relative she was speaking with, as well as the cogwheel rigidity seen on exam, Lewy body dementia is on the DDx. Of note, this would best explain recurrent presentations with confusion, as one of the characteristics of LBD is cognitive fluctuations.   Recommendations: 1. Start Aricept 5 mg po qhs 2. Avoid neuroleptics, which can  worsen cognition and motor function in LBD.  3. Avoid sedating meds. Would gradually taper off Xanax if she takes it regularly.  4. Consider outpatient follow up at [facility] for comprehensive dementia evaluation.   I have seen and examined the patient. I have amended the Assessment and Recommendations above.  Electronically signed: Dr. Kerney Elbe 11/04/2017, 3:04 PM

## 2017-11-04 NOTE — Progress Notes (Signed)
PROGRESS NOTE    Jacqueline [name]  OHY:[id number] DOB: [birth year] DOA: 11/03/2017 PCP: Lajean Manes, MD   Brief Narrative:   Jacqueline [name] is Jacqueline [name] 84 y.o. female with medical history significant of atrial fibrillation on Eliquis, hypertension, GERD, IBS, GI bleed, and recent admission for Jacqueline [name] stroke discharged on 6/29 is presenting with altered mental status.  Assessment & Plan:   Active Problems:   Altered mental status   Acute Encephalopathy:  Unclear etiology at this point in time, started 7/4.   Pt interacting and following commands at times, but not at baseline and still very confused with dysarthria (need to get dentures) and nonsensical speech. No evidence of infection.  UA clean.  CXR without acute findings. Head CT without acute abnormality  MRI brain motion degraded, but no acute abnormality CMP and CBC unremarkable Follow VBG (pending), ammonia (wnl), B12 (wnl), TSH (wnl), utox (negative) Follow EEG (generalized cerebral dysfunction, no epileptiform features seen) Will resume prn xanax Hold norco (apparently she had this this morning, but confusion started tonight, lower suspicion for this med as etiology) Will discuss with neuro given persistence and no apparent etiology Speech eval PT/OT  History of Stroke: recently admitted for stroke, d/c'd on 6/29 Continue eliquis (will resume 5 mg BID dose, discussed with pharmacy) Of note, family states she doesn't tolerate statins, but she was discharged on this (will discuss more tomorrow - family present couldn't tell me about this - will continue to discuss), for now, holding lipitor  Hypertension: metoprolol, diltiazem  Atrial fibrillation: metop, dilt, eliquis.  Follow tele.   NSVT: follow mag, k.  Last echo with normal EF.  CTM on tele.  Anxiety: restart xanax  DVT prophylaxis: eliquis Code Status: DNR Family Communication:family at bedside Disposition Plan: pending improvement   Consultants:   none  Procedures:    Reason for the study Altered mental status  Technical Digital EEG recording using 10-20 international electrode system  Description of the recording Posterior dominant rhythm is 6-7 Hz reactive bilaterally. Intermittent generalized delta slowing Epileptiform features were not seen. Sleep architecture were not seen  Impression The EEG is abnormal and findings are suggestive of mild to moderate generalized cerebral dysfunction.  Epileptiform features were not seen during this recording.  Antimicrobials:  Anti-infectives (From admission, onward)   None      Subjective: Confused.  Babbling, nonsensical speech, difficult to understand.  Objective: Vitals:   11/04/17 0200 11/04/17 0400 11/04/17 0600 11/04/17 0742  BP: (!) 179/85 (!) 197/86 (!) 133/103 (!) 154/93  Pulse:  88  91  Resp:  20 18 16   Temp:    97.9 F (36.6 C)  TempSrc:    Oral  SpO2: 93% 92% 93% 98%  Weight:      Height:        Intake/Output Summary (Last 24 hours) at 11/04/2017 1400 Last data filed at 11/04/2017 0400 Gross per 24 hour  Intake 1088.1 ml  Output 950 ml  Net 138.1 ml   Filed Weights   11/03/17 1423  Weight: 61.7 kg (136 lb)    Examination:  General exam: Appears calm and comfortable, staring into space, babbling  Respiratory system: Clear to auscultation. Respiratory effort normal. Cardiovascular system: S1 & S2 heard, RRR. No JVD, murmurs, rubs, gallops or clicks. No pedal edema. Gastrointestinal system: Abdomen is nondistended, soft and nontender.  Central nervous system: Disoriented, no focal deficits appreciated.  Difficult exam as inconsistently following instructions.  Tremor.  Extremities: No LEE Skin: abrasion to L leg  Data Reviewed: I have personally reviewed following labs and imaging studies  CBC: Recent Labs  Lab 11/03/17 1432 11/04/17 0411  WBC 7.4 7.9  NEUTROABS 5.0  --   HGB 15.3* 16.1*  HCT 47.7* 48.4*  MCV 99.0 97.6  PLT 260 948   Basic Metabolic  Panel: Recent Labs  Lab 11/03/17 1432 11/04/17 0411  NA 140 142  K 4.6 3.6  CL 106 106  CO2 23 25  GLUCOSE 109* 90  BUN 26* 17  CREATININE 1.14* 0.86  CALCIUM 10.2 9.9  MG  --  2.0   GFR: Estimated Creatinine Clearance: 37.8 mL/min (by C-G formula based on SCr of 0.86 mg/dL). Liver Function Tests: Recent Labs  Lab 11/03/17 1432  AST 28  ALT 15  ALKPHOS 77  BILITOT 1.4*  PROT 6.9  ALBUMIN 3.7   No results for input(s): LIPASE, AMYLASE in the last 168 hours. Recent Labs  Lab 11/03/17 2058  AMMONIA 11   Coagulation Profile: No results for input(s): INR, PROTIME in the last 168 hours. Cardiac Enzymes: No results for input(s): CKTOTAL, CKMB, CKMBINDEX, TROPONINI in the last 168 hours. BNP (last 3 results) No results for input(s): PROBNP in the last 8760 hours. HbA1C: Recent Labs    11/04/17 0411  HGBA1C 5.2   CBG: Recent Labs  Lab 11/03/17 1431  GLUCAP 110*   Lipid Profile: Recent Labs    11/04/17 0411  CHOL 144  HDL 63  LDLCALC 67  TRIG 72  CHOLHDL 2.3   Thyroid Function Tests: Recent Labs    11/03/17 2058  TSH 1.824   Anemia Panel: Recent Labs    11/03/17 2058  VITAMINB12 736   Sepsis Labs: No results for input(s): PROCALCITON, LATICACIDVEN in the last 168 hours.  No results found for this or any previous visit (from the past 240 hour(s)).       Radiology Studies: Dg Chest 2 View  Result Date: 11/03/2017 CLINICAL DATA:  Altered level of consciousness EXAM: CHEST - 2 VIEW COMPARISON:  February 13, 2017 FINDINGS: There is no appreciable edema or consolidation. Heart is mildly enlarged with pulmonary vascularity normal. No adenopathy. There is Jacqueline [name] sizable hiatal type hernia. There is aortic atherosclerosis. There is advanced arthropathy in both shoulders. IMPRESSION: No edema or consolidation. Stable cardiac prominence. Sizable hiatal hernia. Aortic atherosclerosis. Aortic Atherosclerosis (ICD10-I70.0). Electronically Signed   By: Lowella Grip III M.D.   On: 11/03/2017 15:34   Ct Head Wo Contrast  Result Date: 11/03/2017 CLINICAL DATA:  Altered mental status EXAM: CT HEAD WITHOUT CONTRAST TECHNIQUE: Contiguous axial images were obtained from the base of the skull through the vertex without intravenous contrast. COMPARISON:  10/24/2017 FINDINGS: Brain: There is atrophy and chronic small vessel disease changes. No acute intracranial abnormality. Specifically, no hemorrhage, hydrocephalus, mass lesion, acute infarction, or significant intracranial injury. Vascular: No hyperdense vessel or unexpected calcification. Skull: No acute calvarial abnormality. Sinuses/Orbits: Visualized paranasal sinuses and mastoids clear. Orbital soft tissues unremarkable. Other: None IMPRESSION: No acute intracranial abnormality. Atrophy, chronic microvascular disease. Electronically Signed   By: Rolm Baptise M.D.   On: 11/03/2017 16:14   Mr Brain Wo Contrast  Result Date: 11/03/2017 CLINICAL DATA:  Altered mental status. EXAM: MRI HEAD WITHOUT CONTRAST TECHNIQUE: Multiplanar, multiecho pulse sequences of the brain and surrounding structures were obtained without intravenous contrast. COMPARISON:  Head CT 11/03/2017 and MRI 10/24/2017 FINDINGS: Some sequences are moderately to severely motion degraded. Brain: No acute infarct, mass, midline shift, or extra-axial fluid collection  is identified. Diffusion signal abnormality associated with the punctate acute right parietal infarct on the prior MRI has resolved. There is Jacqueline [name] chronic microhemorrhage in the pons. Moderate cerebral atrophy is again noted. Small chronic cortical infarcts are again seen in the occipital lobes and posterior right frontal lobe. Small chronic infarcts are also again noted in the bilateral basal ganglia, right thalamus, and cerebellum bilaterally. Patchy T2 hyperintensities throughout the cerebral white matter and pons are similar to the prior MRI and nonspecific but compatible with moderate  chronic small vessel ischemic disease. Vascular: Major intracranial vascular flow voids are preserved. Skull and upper cervical spine: No suspicious marrow lesion identified. Sinuses/Orbits: Left cataract extraction. Small left sphenoid sinus fluid level. Clear mastoid air cells. Other: None. IMPRESSION: 1. Motion degraded examination without evidence of acute intracranial abnormality. 2. Moderate chronic small vessel ischemic disease and cerebral atrophy. Electronically Signed   By: Logan Bores M.D.   On: 11/03/2017 19:43        Scheduled Meds: .  stroke: mapping our early stages of recovery book   Does not apply Once  . apixaban  2.5 mg Oral BID  . diltiazem  180 mg Oral BID  . metoprolol tartrate  25 mg Oral BID  . pantoprazole  40 mg Oral Daily   Continuous Infusions:   LOS: 0 days    Time spent: over 30 min    Fayrene Helper, MD [facility] Pager [phone]  If 7PM-7AM, please contact night-coverage [email or web address] Password [email or web address] 11/04/2017, 2:00 PM

## 2017-11-04 NOTE — Progress Notes (Signed)
EEG completed, results pending. 

## 2017-11-04 NOTE — Evaluation (Signed)
Speech Language Pathology Evaluation Patient Details Name: Jacqueline [name] MRN: [id number] DOB: 03/08/1929 Today's Date: 11/04/2017 Time: 5784-6962 SLP Time Calculation (min) (ACUTE ONLY): 8 min  Problem List:  Patient Active Problem List   Diagnosis Date Noted  . Altered mental status 11/03/2017  . CVA (cerebral vascular accident) ([place]) 10/25/2017  . Benign essential HTN   . History of TIA (transient ischemic attack)   . TIA (transient ischemic attack) 10/24/2017  . Cerebral infarction due to embolism of right middle cerebral artery ([place])   . HLD (hyperlipidemia)   . Acute ischemic right MCA stroke ([place]) 06/18/2014  . Facial droop   . Displaced fracture of shaft of left humerus 04/03/2014  . Right arm fracture 04/01/2014  . Displaced spiral fracture of shaft of humerus   . Peripheral arterial disease ([place]) 10/03/2013  . Chronic diastolic CHF (congestive heart failure) ([place]) 08/08/2013  . Chronic venous insufficiency 04/02/2013  . Hypokalemia 01/13/2013  . PAF (paroxysmal atrial fibrillation) ([place]) 10/03/2012  . CAD (coronary artery disease) of artery bypass graft 04/05/2012  . Atrial fibrillation with RVR ([place]) 03/13/2012  . Need for prophylactic vaccination and inoculation against influenza 02/14/2012  . ABDOMINAL WALL HERNIA 01/21/2010  . PARESTHESIA 05/04/2009  . SKIN LESION 01/28/2009  . Nocturia 01/28/2009  . DYSPNEA 01/01/2009  . CVA (cerebral infarction) 11/17/2008  . ALLERGY 10/27/2008  . PAROXYSMAL NOCTURNAL DYSPNEA 10/03/2008  . Osteoarthrosis, unspecified whether generalized or localized, involving lower leg 09/03/2008  . RESTRICTIVE LUNG DISEASE 05/26/2008  . Anemia 01/08/2008  . UNS ADVRS EFF OTH RX MEDICINAL&BIOLOGICAL SBSTNC 11/09/2007  . HEMATOCHEZIA 07/18/2007  . PARANOIA 06/01/2007  . Anxiety state 01/23/2007  . Hypertensive heart disease with CHF ([place]) 01/23/2007  . GI BLEEDING 01/23/2007  . Osteoarthritis 01/23/2007  . DEGENERATIVE DISC DISEASE, LUMBAR  SPINE 01/23/2007  . History of peptic ulcer disease 01/23/2007  . Personal history of other diseases of digestive system 01/23/2007   Past Medical History:  Past Medical History:  Diagnosis Date  . Allergy, unspecified not elsewhere classified   . Anemia, unspecified   . Anxiety state, unspecified   . Atrial fibrillation with rapid ventricular response ([place]) 03/13/12  . Blood in stool   . Complication of anesthesia    "hard to wake me up after colonoscopy" (03/13/12)  . Degeneration of lumbar or lumbosacral intervertebral disc   . GERD (gastroesophageal reflux disease)   . H/O hiatal hernia   . Hemorrhage of gastrointestinal tract, unspecified   . History of IBS   . Osteoarthrosis, unspecified whether generalized or localized, lower leg   . Other diseases of lung, not elsewhere classified   . Other dyspnea and respiratory abnormality   . Personal history of other diseases of digestive system   . Personal history of peptic ulcer disease   . Unspecified adverse effect of other drug, medicinal and biological substance(995.29)   . Unspecified cerebral artery occlusion with cerebral infarction 10/2008   "mini stroke" denies residual (03/13/12)  . Unspecified essential hypertension   . Unspecified paranoid state    Past Surgical History:  Past Surgical History:  Procedure Laterality Date  . DILATION AND CURETTAGE OF UTERUS  1950's?  . ORIF HUMERUS FRACTURE Right 04/03/2014   Procedure: OPEN REDUCTION INTERNAL FIXATION (ORIF) RIGHT HUMERUS DISTAL FRACTURE;  Surgeon: Roseanne Kaufman, MD;  Location: [facility];  Service: Orthopedics;  Laterality: Right;  . PILONIDAL CYST / SINUS EXCISION  1957  . SHOULDER ARTHROSCOPY  ~ 2000   right   HPI:  Jacqueline  [name] is a 84 y.o. female with medical history significant of atrial fibrillation on Eliquis, hypertension, GERD, IBS, GI bleed, and recent admission for a stroke discharged on 6/29 admitted with acute encephalopathy of unclear etiology. CT, MRI  without acute findings.    Assessment / Plan / Recommendation Clinical Impression   Patient presents with cognitive-communication impairments worsened from prior SLP assessment 10/25/17. She is fully alert, but disoriented to location, situation, and time. Pt with incoherent, rambling speech, with some intelligible phrases and sentences. Responses to simple questions from this SLP are confused, inappropriate. Follows some simple commands but max cues required. Pt's son arrived as SLP was completing assessment; pt recognized son and was able to have a grossly fluent simple conversation, though confusion persists. SLP will follow briefly during acute stay to maximize functional cognition for safety and to facilitate communication of needs.     SLP Assessment  SLP Recommendation/Assessment: Patient needs continued Speech Lanaguage Pathology Services SLP Visit Diagnosis: Dysphagia, unspecified (R13.10)    Follow Up Recommendations  Skilled Nursing facility    Frequency and Duration min 2x/week  2 weeks      SLP Evaluation Cognition  Overall Cognitive Status: History of cognitive impairments - at baseline Arousal/Alertness: Awake/alert Orientation Level: Oriented to person;Disoriented to place;Disoriented to time;Disoriented to situation Attention: Sustained Sustained Attention: Impaired Sustained Attention Impairment: Verbal basic;Functional basic Memory: Impaired Memory Impairment: Storage deficit;Retrieval deficit;Decreased recall of new information Awareness: Impaired Awareness Impairment: Intellectual impairment Problem Solving: Impaired(at baseline) Problem Solving Impairment: Verbal basic Executive Function: Reasoning Reasoning: Impaired(at baseline) Behaviors: Restless Safety/Judgment: Impaired       Comprehension  Auditory Comprehension Overall Auditory Comprehension: Impaired Yes/No Questions: Impaired Basic Biographical Questions: 51-75% accurate Commands: Impaired One  Step Basic Commands: 0-24% accurate Two Step Basic Commands: 0-24% accurate Conversation: Simple Interfering Components: Attention Reading Comprehension Reading Status: Not tested    Expression Expression Primary Mode of Expression: Verbal Verbal Expression Overall Verbal Expression: Impaired Initiation: No impairment Level of Generative/Spontaneous Verbalization: (fluent, incoherent speech) Repetition: Impaired Level of Impairment: Word level Naming: Impairment Confrontation: Impaired Convergent: 0-24% accurate(1/5) Divergent: Not tested Verbal Errors: Jargon;Language of confusion;Not aware of errors Written Expression Dominant Hand: Right Written Expression: Not tested   Oral / Motor  Oral Motor/Sensory Function Overall Oral Motor/Sensory Function: Within functional limits Motor Speech Overall Motor Speech: Appears within functional limits for tasks assessed   [place], [place], [place] Speech-Language Pathologist [place] 11/04/2017, 10:20 AM

## 2017-11-04 NOTE — Evaluation (Signed)
Physical Therapy Evaluation Patient Details Name: Jacqueline [name] MRN: [id number] DOB: 09-23-1928 Today's Date: 11/04/2017   History of Present Illness  84 y.o. female with medical history significant of atrial fibrillation on Eliquis, hypertension, GERD, IBS, GI bleed, and recent admission for a stroke discharged on 6/29 is presenting with altered mental status.  Clinical Impression  Pt admitted with above diagnosis. Pt currently with functional limitations due to the deficits listed below (see PT Problem List). Patient is from [facility]. Alert but not oriented; unable to follow simple commands or able to be directed to a task with max multimodal cueing. Presenting with decreased functional mobility secondary to cognitive deficits, poor sitting balance, and generalized weakness. Requiring two person total assistance for bed mobility and two person maximal assistance to transition to standing edge of bed. Suspect patient will progress if mental status improves. Pt will benefit from skilled PT to increase their independence and safety with mobility to allow discharge to SNF.     Follow Up Recommendations SNF;Supervision/Assistance - 24 hour    Equipment Recommendations  None recommended by PT    Recommendations for Other Services       Precautions / Restrictions Precautions Precautions: Fall Restrictions Weight Bearing Restrictions: No      Mobility  Bed Mobility Overal bed mobility: Needs Assistance Bed Mobility: Supine to Sit;Sit to Supine     Supine to sit: Total assist;+2 for physical assistance Sit to supine: Total assist;+2 for physical assistance   General bed mobility comments: Pt with decreased initiation and requiring total A to perform bed mobility.  Transfers Overall transfer level: Needs assistance Equipment used: 2 person hand held assist Transfers: Sit to/from Stand Sit to Stand: Max assist;+2 physical assistance         General transfer comment: Max A for  initation into standing. Pt requiring +2 for safety and with difficulty following cues for sit<>Stand. In standing, pt maintaining flexed knees  Ambulation/Gait                Stairs            Wheelchair Mobility    Modified Rankin (Stroke Patients Only)       Balance Overall balance assessment: Needs assistance;History of Falls Sitting-balance support: No upper extremity supported;Feet supported Sitting balance-Leahy Scale: Fair Sitting balance - Comments: Initially requiring totalA due to posterior lean and retropulsion. Requiring tactile cues and time to reduce pushing back. Pt transitioning to sitting at EOB with MIn Guard A +2. Postural control: Posterior lean Standing balance support: Bilateral upper extremity supported;During functional activity Standing balance-Leahy Scale: Poor Standing balance comment: Reliant on physical A for balance                             Pertinent Vitals/Pain Pain Assessment: Faces Faces Pain Scale: Hurts little more Pain Location: Grimacing with touch of right foot Pain Descriptors / Indicators: Discomfort;Grimacing Pain Intervention(s): Monitored during session    Home Living Family/patient expects to be discharged to:: Skilled nursing facility                 Additional Comments: Pt recently at [facility] for [place] rehab post CVA,.    Prior Function Level of Independence: Needs assistance         Comments: No family present to provide information. Per chart review, pt requiring assistance for ADLs and functional mobility.     Hand Dominance   Dominant Hand:  Right    Extremity/Trunk Assessment   Upper Extremity Assessment Upper Extremity Assessment: Generalized weakness    Lower Extremity Assessment Lower Extremity Assessment: Generalized weakness    Cervical / Trunk Assessment Cervical / Trunk Assessment: Kyphotic  Communication   Communication: Receptive difficulties;Expressive  difficulties;Other (comment)(Continuous nonsensical speech)  Cognition Arousal/Alertness: Awake/alert Behavior During Therapy: Restless Overall Cognitive Status: No family/caregiver present to determine baseline cognitive functioning Area of Impairment: Orientation;Attention;Memory;Following commands;Safety/judgement;Awareness;Problem solving                 Orientation Level: Disoriented to;Person;Place;Time;Situation Current Attention Level: Focused Memory: Decreased recall of precautions;Decreased short-term memory Following Commands: Follows one step commands inconsistently Safety/Judgement: Decreased awareness of safety;Decreased awareness of deficits Awareness: Intellectual Problem Solving: Slow processing;Decreased initiation;Difficulty sequencing;Requires verbal cues;Requires tactile cues General Comments: Upon arrival and throughout session, pt "babbling". Tangential speech throughout session with no following of cues. Pt requiring [place] visual and verbal cues for eye contact and then only holding ofr 1-2 seconds. Pt presenting with poor attention and not engaging in any ADL task oresented to her. During donning right socks, pt focusing to tell therapist to "hold on one mintue" and reached out to grab sock. This was only time during session where pt focused on one topic.       General Comments General comments (skin integrity, edema, etc.): Abrasion to LLE    Exercises     Assessment/Plan    PT Assessment Patient needs continued PT services  PT Problem List Decreased activity tolerance;Decreased balance;Decreased mobility;Decreased coordination;Decreased cognition;Decreased safety awareness;Pain       PT Treatment Interventions DME instruction;Gait training;Functional mobility training;Therapeutic activities;Therapeutic exercise;Balance training;Neuromuscular re-education;Cognitive remediation;Patient/family education    PT Goals (Current goals can be found in the Care  Plan section)  Acute Rehab PT Goals Patient Stated Goal: unstated PT Goal Formulation: Patient unable to participate in goal setting    Frequency Min 2X/week   Barriers to discharge        Co-evaluation               AM-PAC PT "6 Clicks" Daily Activity  Outcome Measure Difficulty turning over in bed (including adjusting bedclothes, sheets and blankets)?: Unable Difficulty moving from lying on back to sitting on the side of the bed? : Unable Difficulty sitting down on and standing up from a chair with arms (e.g., wheelchair, bedside commode, etc,.)?: Unable Help needed moving to and from a bed to chair (including a wheelchair)?: A Lot Help needed walking in hospital room?: A Lot Help needed climbing 3-5 steps with a railing? : Total 6 Click Score: 8    End of Session Equipment Utilized During Treatment: Gait belt Activity Tolerance: Patient limited by fatigue Patient left: in bed;with call bell/phone within reach;with bed alarm set Nurse Communication: Mobility status PT Visit Diagnosis: Unsteadiness on feet (R26.81);Difficulty in walking, not elsewhere classified (R26.2);Other symptoms and signs involving the nervous system (R29.898)    Time: 0810-0829 PT Time Calculation (min) (ACUTE ONLY): 19 min   Charges:   PT Evaluation $PT Eval Moderate Complexity: 1 Mod     PT G Codes:        Ellamae Sia, PT, DPT Acute Rehabilitation Services  Pager: [place] 11/04/2017, 9:44 AM

## 2017-11-04 NOTE — Evaluation (Signed)
Clinical/Bedside Swallow Evaluation Patient Details  Name: Jacqueline [name] MRN: [id number] Date of Birth: 1928/11/19  Today's Date: 11/04/2017 Time: SLP Start Time (ACUTE ONLY): 3570 SLP Stop Time (ACUTE ONLY): 0950 SLP Time Calculation (min) (ACUTE ONLY): 8 min  Past Medical History:  Past Medical History:  Diagnosis Date  . Allergy, unspecified not elsewhere classified   . Anemia, unspecified   . Anxiety state, unspecified   . Atrial fibrillation with rapid ventricular response ([place]) 03/13/12  . Blood in stool   . Complication of anesthesia    "hard to wake me up after colonoscopy" (03/13/12)  . Degeneration of lumbar or lumbosacral intervertebral disc   . GERD (gastroesophageal reflux disease)   . H/O hiatal hernia   . Hemorrhage of gastrointestinal tract, unspecified   . History of IBS   . Osteoarthrosis, unspecified whether generalized or localized, lower leg   . Other diseases of lung, not elsewhere classified   . Other dyspnea and respiratory abnormality   . Personal history of other diseases of digestive system   . Personal history of peptic ulcer disease   . Unspecified adverse effect of other drug, medicinal and biological substance(995.29)   . Unspecified cerebral artery occlusion with cerebral infarction 10/2008   "mini stroke" denies residual (03/13/12)  . Unspecified essential hypertension   . Unspecified paranoid state    Past Surgical History:  Past Surgical History:  Procedure Laterality Date  . DILATION AND CURETTAGE OF UTERUS  1950's?  . ORIF HUMERUS FRACTURE Right 04/03/2014   Procedure: OPEN REDUCTION INTERNAL FIXATION (ORIF) RIGHT HUMERUS DISTAL FRACTURE;  Surgeon: Roseanne Kaufman, MD;  Location: [facility];  Service: Orthopedics;  Laterality: Right;  . PILONIDAL CYST / SINUS EXCISION  1957  . SHOULDER ARTHROSCOPY  ~ 2000   right   HPI:  Jacqueline [name] is a 84 y.o. female with medical history significant of atrial fibrillation on Eliquis, hypertension, GERD, IBS, GI  bleed, and recent admission for a stroke discharged on 6/29 admitted with acute encephalopathy of unclear etiology. CT, MRI without acute findings.    Assessment / Plan / Recommendation Clinical Impression   Patient presents with cognitively-based dysphagia impacting awareness of boluses and timing of oral transit. She requires full assistance for cup sips of thin liquids. There is mild anterior spillage due to poor awareness, which improves when SLP assists pt with self-feeding cup sips. Intermittent mild oral holding. Max cues for use of straw, with poor coordination initially but she is ultimately able to retrieve bolus, with subtle delayed throat clear noted. Accepts pudding without initial attempts to manipulate but clears with verbal cues. Would initiate clear liquids with full supervision, meds crushed in puree. Anticipate advancement with improvements in mentation. Will follow up. D/w pt's son and RN.    SLP Visit Diagnosis: Dysphagia, unspecified (R13.10)    Aspiration Risk  Moderate aspiration risk    Diet Recommendation Thin liquid(clear liquids)   Liquid Administration via: Cup Medication Administration: Crushed with puree Supervision: Full supervision/cueing for compensatory strategies;Staff to assist with self feeding Compensations: Slow rate;Small sips/bites;Minimize environmental distractions    Other  Recommendations Oral Care Recommendations: Oral care QID   Follow up Recommendations Skilled Nursing facility      Frequency and Duration min 2x/week  2 weeks       Prognosis Prognosis for Safe Diet Advancement: Good Barriers to Reach Goals: Cognitive deficits      Swallow Study   General Date of Onset: 11/03/17 HPI: Jacqueline [name] is a 84 y.o. female  with medical history significant of atrial fibrillation on Eliquis, hypertension, GERD, IBS, GI bleed, and recent admission for a stroke discharged on 6/29 admitted with acute encephalopathy of unclear etiology. CT, MRI  without acute findings.  Type of Study: Bedside Swallow Evaluation Previous Swallow Assessment: followed briefly in 2016 by SLP, regular diet/thin liquids Diet Prior to this Study: NPO Temperature Spikes Noted: No Respiratory Status: Room air History of Recent Intubation: No Behavior/Cognition: Alert;Confused Oral Cavity Assessment: Dry Oral Care Completed by SLP: No Oral Cavity - Dentition: Dentures, top Vision: Functional for self-feeding Self-Feeding Abilities: Total assist Patient Positioning: Upright in bed Baseline Vocal Quality: Normal Volitional Cough: Cognitively unable to elicit Volitional Swallow: Unable to elicit    Oral/Motor/Sensory Function Overall Oral Motor/Sensory Function: Within functional limits   Ice Chips Ice chips: Impaired Oral Phase Impairments: Poor awareness of bolus Oral Phase Functional Implications: Prolonged oral transit   Thin Liquid Thin Liquid: Impaired Presentation: Cup;Straw Oral Phase Impairments: Poor awareness of bolus;Reduced labial seal Oral Phase Functional Implications: Left anterior spillage;Prolonged oral transit Pharyngeal  Phase Impairments: Throat Clearing - Delayed    Nectar Thick Nectar Thick Liquid: Not tested   Honey Thick Honey Thick Liquid: Not tested   Puree Puree: Impaired Presentation: Spoon Oral Phase Impairments: Poor awareness of bolus;Reduced lingual movement/coordination Oral Phase Functional Implications: Prolonged oral transit   Solid   GO   Solid: Not tested       Deneise Lever, [place], CCC-SLP Speech-Language Pathologist [phone]  Aliene Altes 11/04/2017,10:07 AM

## 2017-11-05 DIAGNOSIS — R4182 Altered mental status, unspecified: Secondary | ICD-10-CM | POA: Diagnosis not present

## 2017-11-05 LAB — BASIC METABOLIC PANEL
ANION GAP: 8 (ref 5–15)
BUN: 27 mg/dL — ABNORMAL HIGH (ref 8–23)
CHLORIDE: 109 mmol/L (ref 98–111)
CO2: 25 mmol/L (ref 22–32)
Calcium: 9.6 mg/dL (ref 8.9–10.3)
Creatinine, Ser: 1.04 mg/dL — ABNORMAL HIGH (ref 0.44–1.00)
GFR calc non Af Amer: 47 mL/min — ABNORMAL LOW (ref >60)
GFR, EST AFRICAN AMERICAN: 54 mL/min — AB (ref >60)
Glucose, Bld: 86 mg/dL (ref 70–99)
POTASSIUM: 3.8 mmol/L (ref 3.5–5.1)
SODIUM: 142 mmol/L (ref 135–145)

## 2017-11-05 LAB — CBC
HEMATOCRIT: 47.5 % — AB (ref 36.0–46.0)
Hemoglobin: 15.5 g/dL — ABNORMAL HIGH (ref 12.0–15.0)
MCH: 32.2 pg (ref 26.0–34.0)
MCHC: 32.6 g/dL (ref 30.0–36.0)
MCV: 98.5 fL (ref 78.0–100.0)
Platelets: 256 10*3/uL (ref 150–400)
RBC: 4.82 MIL/uL (ref 3.87–5.11)
RDW: 13.3 % (ref 11.5–15.5)
WBC: 8.8 10*3/uL (ref 4.0–10.5)

## 2017-11-05 LAB — MAGNESIUM: MAGNESIUM: 2.1 mg/dL (ref 1.7–2.4)

## 2017-11-05 NOTE — Plan of Care (Signed)
Patient stable, discussed POC with patient family, agreeable w/plan, SW contacted, family denies question/concerns at this time.

## 2017-11-05 NOTE — Progress Notes (Addendum)
PROGRESS NOTE    Jacqueline [name]  QJF:[id number] DOB: 05/25/1928 DOA: 11/03/2017 PCP: Lajean Manes, MD   Brief Narrative:   Jacqueline [name] is Jacqueline [name] 84 y.o. female with medical history significant of atrial fibrillation on Eliquis, hypertension, GERD, IBS, GI bleed, and recent admission for Jacqueline [name] stroke discharged on 6/29 is presenting with altered mental status.  Assessment & Plan:   Active Problems:   Altered mental status   Acute Encephalopathy:  Unclear etiology at this point in time, started 7/4.  Significantly improved today. Appreciate neurology's assistance -> suspect acute delirium vs cognitive fluctuation in setting of dementia - lewy body dementia on ddx.  Recommending avoid neuroleptics, start aricept, avoid sedating meds (recommended tapering xanax - this will be difficult as pt emphatic that she wants to continue this), as well as outpatient f/u with [facility] No evidence of infection.  UA clean.  CXR without acute findings. Head CT without acute abnormality  MRI brain motion degraded, but no acute abnormality CMP and CBC unremarkable Follow VBG (pending), ammonia (wnl), B12 (wnl), TSH (wnl), utox (negative) Follow EEG (generalized cerebral dysfunction, no epileptiform features seen) Will resume prn xanax Speech eval -> dysphagia 1, thin liquid PT/OT  History of Stroke: recently admitted for stroke, d/c'd on 6/29 Continue eliquis (will resume 5 mg BID dose, discussed with pharmacy) Of note, family states she doesn't tolerate statins (pt refusing statin, notes myalgias with this), for now, holding lipitor - follow outpatient  Hypertension: metoprolol, diltiazem (changed to immediate release as meds crushed with puree)  Atrial fibrillation: metop, dilt, eliquis.  Will d/c tele.  NSVT: follow mag, k.  Last echo with normal EF.  Will d/c tele.   Anxiety: restart xanax  DVT prophylaxis: eliquis Code Status: DNR Family Communication:family at bedside Disposition Plan:  pending SNF placement   Consultants:   none  Procedures:  Reason for the study Altered mental status  Technical Digital EEG recording using 10-20 international electrode system  Description of the recording Posterior dominant rhythm is 6-7 Hz reactive bilaterally. Intermittent generalized delta slowing Epileptiform features were not seen. Sleep architecture were not seen  Impression The EEG is abnormal and findings are suggestive of mild to moderate generalized cerebral dysfunction.  Epileptiform features were not seen during this recording.  Antimicrobials:  Anti-infectives (From admission, onward)   None      Subjective: Jacqueline [name]&Ox3 this morning. Alert, communicating well. No pain.  Feeling better. Daughter in law thinks things are better as well.  Objective: Vitals:   11/05/17 0401 11/05/17 0727 11/05/17 1318 11/05/17 1555  BP: 126/68 123/74 108/61 122/83  Pulse: 79 91 75 98  Resp:  16 20 20   Temp:  97.9 F (36.6 C) 98 F (36.7 C) 98.1 F (36.7 C)  TempSrc:  Oral Oral Oral  SpO2: 97% 94% 97% 96%  Weight:      Height:        Intake/Output Summary (Last 24 hours) at 11/05/2017 1619 Last data filed at 11/05/2017 1610 Gross per 24 hour  Intake -  Output 700 ml  Net -700 ml   Filed Weights   11/03/17 1423  Weight: 61.7 kg (136 lb)    Examination:  General: No acute distress. Cardiovascular: Heart sounds show Jacqueline [name] regular rate, and rhythm. No gallops or rubs. No murmurs. No JVD. Lungs: Clear to auscultation bilaterally with good air movement.  Abdomen: Soft, nontender, nondistended Neurological: Alert and oriented 3. Moves all extremities 4 with equal strength. Cranial nerves II through XII intact. Skin:  Warm and dry. No rashes or lesions. Extremities: No clubbing or cyanosis. No edema. Pedal pulses 2+. Psychiatric: Mood and affect are normal. Insight and judgment are appropriate.    Data Reviewed: I have personally reviewed following labs and imaging  studies  CBC: Recent Labs  Lab 11/03/17 1432 11/04/17 0411 11/05/17 0625  WBC 7.4 7.9 8.8  NEUTROABS 5.0  --   --   HGB 15.3* 16.1* 15.5*  HCT 47.7* 48.4* 47.5*  MCV 99.0 97.6 98.5  PLT 260 225 643   Basic Metabolic Panel: Recent Labs  Lab 11/03/17 1432 11/04/17 0411 11/05/17 0625  NA 140 142 142  K 4.6 3.6 3.8  CL 106 106 109  CO2 23 25 25   GLUCOSE 109* 90 86  BUN 26* 17 27*  CREATININE 1.14* 0.86 1.04*  CALCIUM 10.2 9.9 9.6  MG  --  2.0 2.1   GFR: Estimated Creatinine Clearance: 31.3 mL/min (Jacqueline [name]) (by C-G formula based on SCr of 1.04 mg/dL (H)). Liver Function Tests: Recent Labs  Lab 11/03/17 1432  AST 28  ALT 15  ALKPHOS 77  BILITOT 1.4*  PROT 6.9  ALBUMIN 3.7   No results for input(s): LIPASE, AMYLASE in the last 168 hours. Recent Labs  Lab 11/03/17 2058  AMMONIA 11   Coagulation Profile: No results for input(s): INR, PROTIME in the last 168 hours. Cardiac Enzymes: No results for input(s): CKTOTAL, CKMB, CKMBINDEX, TROPONINI in the last 168 hours. BNP (last 3 results) No results for input(s): PROBNP in the last 8760 hours. HbA1C: Recent Labs    11/04/17 0411  HGBA1C 5.2   CBG: Recent Labs  Lab 11/03/17 1431  GLUCAP 110*   Lipid Profile: Recent Labs    11/04/17 0411  CHOL 144  HDL 63  LDLCALC 67  TRIG 72  CHOLHDL 2.3   Thyroid Function Tests: Recent Labs    11/03/17 2058  TSH 1.824   Anemia Panel: Recent Labs    11/03/17 2058  VITAMINB12 736   Sepsis Labs: No results for input(s): PROCALCITON, LATICACIDVEN in the last 168 hours.  No results found for this or any previous visit (from the past 240 hour(s)).       Radiology Studies: Mr Brain Wo Contrast  Result Date: 11/03/2017 CLINICAL DATA:  Altered mental status. EXAM: MRI HEAD WITHOUT CONTRAST TECHNIQUE: Multiplanar, multiecho pulse sequences of the brain and surrounding structures were obtained without intravenous contrast. COMPARISON:  Head CT 11/03/2017 and MRI  10/24/2017 FINDINGS: Some sequences are moderately to severely motion degraded. Brain: No acute infarct, mass, midline shift, or extra-axial fluid collection is identified. Diffusion signal abnormality associated with the punctate acute right parietal infarct on the prior MRI has resolved. There is Jacqueline [name] chronic microhemorrhage in the pons. Moderate cerebral atrophy is again noted. Small chronic cortical infarcts are again seen in the occipital lobes and posterior right frontal lobe. Small chronic infarcts are also again noted in the bilateral basal ganglia, right thalamus, and cerebellum bilaterally. Patchy T2 hyperintensities throughout the cerebral white matter and pons are similar to the prior MRI and nonspecific but compatible with moderate chronic small vessel ischemic disease. Vascular: Major intracranial vascular flow voids are preserved. Skull and upper cervical spine: No suspicious marrow lesion identified. Sinuses/Orbits: Left cataract extraction. Small left sphenoid sinus fluid level. Clear mastoid air cells. Other: None. IMPRESSION: 1. Motion degraded examination without evidence of acute intracranial abnormality. 2. Moderate chronic small vessel ischemic disease and cerebral atrophy. Electronically Signed   By: Seymour Bars.D.  On: 11/03/2017 19:43        Scheduled Meds: .  stroke: mapping our early stages of recovery book   Does not apply Once  . apixaban  5 mg Oral BID  . diltiazem  60 mg Oral Q8H  . donepezil  5 mg Oral QHS  . metoprolol tartrate  25 mg Oral BID  . pantoprazole  40 mg Oral Daily   Continuous Infusions:   LOS: 0 days    Time spent: over 30 min    Fayrene Helper, MD [facility] Pager [phone]  If 7PM-7AM, please contact night-coverage [email or web address] Password [email or web address] 11/05/2017, 4:19 PM

## 2017-11-05 NOTE — Progress Notes (Signed)
  Speech Language Pathology Treatment: Dysphagia  Patient Details Name: Jacqueline [name] MRN: [id number] DOB: 12-Dec-1928 Today's Date: 11/05/2017 Time: 6213-0865 SLP Time Calculation (min) (ACUTE ONLY): 12 min  Assessment / Plan / Recommendation Clinical Impression  Pt seen for follow-up for dysphagia. She remains confused but speech is clear today and pt requesting Xanax, stating she has a headache. SLP oriented pt to situation and pt recalled details 5 minutes later. Anomia, dysnomia in conversation noted. Mod A required for self-feeding purees; pt had single cough x1 after rapid intake, but no overt signs of aspiration with tactile assist for control of rate/bolus size. Self-feeds straw sips of thin liquids with min A for retrieval of cup. Recommend advancement to dys 1, thin liquids, would continue full supervision due to need for some assistance with intake and impulsivity. Will follow up. D/w RN.    HPI HPI: Jacqueline [name] is a 84 y.o. female with medical history significant of atrial fibrillation on Eliquis, hypertension, GERD, IBS, GI bleed, and recent admission for a stroke discharged on 6/29 admitted with acute encephalopathy of unclear etiology. CT, MRI without acute findings.       SLP Plan  Continue with current plan of care       Recommendations  Diet recommendations: Dysphagia 1 (puree);Thin liquid Liquids provided via: Cup;Straw Medication Administration: Crushed with puree Supervision: Full supervision/cueing for compensatory strategies Compensations: Slow rate;Small sips/bites;Minimize environmental distractions                Oral Care Recommendations: Oral care BID Follow up Recommendations: Skilled Nursing facility SLP Visit Diagnosis: Dysphagia, unspecified (R13.10) Plan: Continue with current plan of care       [place], H. Rivera Colon, [place] Speech-Language Pathologist [phone]  Aliene Altes 11/05/2017, 9:38 AM

## 2017-11-06 DIAGNOSIS — R4182 Altered mental status, unspecified: Secondary | ICD-10-CM | POA: Diagnosis not present

## 2017-11-06 MED ORDER — DONEPEZIL HCL 5 MG PO TABS: 5.0000 mg | ORAL_TABLET | Freq: Every day | ORAL | 0 refills | Status: DC

## 2017-11-06 MED ORDER — DILTIAZEM HCL 60 MG PO TABS: 60.0000 mg | ORAL_TABLET | Freq: Three times a day (TID) | ORAL | 0 refills | Status: DC

## 2017-11-06 MED ORDER — METOPROLOL TARTRATE 25 MG PO TABS: 25.0000 mg | ORAL_TABLET | Freq: Two times a day (BID) | ORAL | 0 refills | Status: DC

## 2017-11-06 NOTE — Progress Notes (Signed)
CSW following for discharge plan. Per chart review, patient is a recent readmit, full assessment last completed on 10/27/17 (see below). Patient had discharged to [facility] per family request. However, patient and family have concerns about care received at [facility] and would like to review other facility options. CSW met with patient and son, Jacqueline [name], at bedside to discuss SNF options. CSW provided patient's son with SNF list to review.   CSW discussed patient and family preference to admit to CIR, if possible. Patient's preference would be to admit to CIR. However, per chart review, patient's family was unable to provide 24/7 support during last admission; this was the reason that CIR admission was declined. CSW will touch base with family to determine if they are able to provide 24/7 assist for the patient when she returns home. If unable to provide 24/7 assist, will still need SNF placement.   CSW has faxed out referral, and will follow.  Laveda Abbe, [place] Clinical Social Worker [phone]      Clinical Social Work Assessment  Patient Details  Name: Jacqueline [name] MRN: [id number] Date of Birth: [birth year]  Date of referral:  10/27/17               Reason for consult:  Facility Placement                 Permission sought to share information with:  Facility Sport and exercise psychologist, Family Supports Permission granted to share information::  Yes, Verbal Permission Granted             Name::     [facility], broadcasting/film/video::  SNF             Relationship::  Son             Contact Information:     Housing/Transportation Living arrangements for the past 2 months:  Single Family Home Source of Information:  Patient, Adult Children Patient Interpreter Needed:  None Criminal Activity/Legal Involvement Pertinent to Current Situation/Hospitalization:  No - Comment as needed Significant Relationships:  Adult Children Lives with:  Self, Adult Children Do you feel safe going back to the  place where you live?  Yes Need for family participation in patient care:  No (Coment)  Care giving concerns:  Patient from home with son, but will need short term rehab at discharge prior to returning home.   Social Worker assessment / plan:  CSW alerted by rehab admissions that patient's family would like to pursue SNF and would like [facility]. CSW spoke with patient's son over the phone to discuss bed offers and confirm preference for [facility]. CSW to continue to follow.  Employment status:  Retired Forensic scientist:  Medicare PT Recommendations:  Inpatient [place] / Referral to community resources:  [place]  Patient/Family's Response to care:  Patient and family agreeable to SNF placement.  Patient/Family's Understanding of and Emotional Response to Diagnosis, Current Treatment, and Prognosis:  Patient and family are aware that the patient needs additional assistance at this time and will benefit from a period of rehabilitation prior to returning home. Patient prefers [facility] as she has been there before and is familiar with it.  Emotional Assessment Appearance:  Appears stated age Attitude/Demeanor/Rapport:  Engaged Affect (typically observed):  Pleasant Orientation:  Oriented to Self, Oriented to Place, Oriented to  Time, Oriented to Situation Alcohol / Substance use:  Not Applicable Psych involvement (Current  and /or in the community):  No (Comment)  Discharge Needs  Concerns to be addressed:  Care Coordination Readmission within the last 30 days:  No Current discharge risk:  Physical Impairment, Dependent with Mobility Barriers to Discharge:  Continued Medical Work up, [place], [place] 10/27/2017, 4:17 PM

## 2017-11-06 NOTE — Progress Notes (Signed)
SLP Cancellation Note  Patient Details Name: Jacqueline [name] MRN: [id number] DOB: [birth year]   Cancelled treatment:       Reason Eval/Treat Not Completed: Patient at procedure or test/unavailable   Elvina Sidle, M.S., CCC-SLP 11/06/2017, 2:58 PM

## 2017-11-06 NOTE — Care Management Obs Status (Signed)
[place] NOTIFICATION   Patient Details  Name: Jacqueline [name] MRN: [id number] Date of Birth: 21-Jul-1928   Medicare Observation Status Notification Given:  Yes    Pollie Friar, RN 11/06/2017, 3:37 PM

## 2017-11-06 NOTE — NC FL2 (Signed)
[place] MEDICAID FL2 LEVEL OF CARE SCREENING TOOL     IDENTIFICATION  Patient Name: Jacqueline [name] Birthdate: Jun 16, 1928 Sex: female Admission Date (Current Location): 11/03/2017  [facility] and [place] Number:  Herbalist and Address:  [facility] [place] [place] [address], [place], [place] [address]      Provider Number: [id number]  Attending Physician Name and Address:  Elodia Florence., *  Relative Name and Phone Number:       Current Level of Care: Hospital Recommended Level of Care: [place] Prior Approval Number:    Date Approved/Denied:   PASRR Number: [id number]  Discharge Plan: SNF    Current Diagnoses: Patient Active Problem List   Diagnosis Date Noted  . GERD (gastroesophageal reflux disease) 11/04/2017  . Altered mental status 11/03/2017  . CVA (cerebral vascular accident) ([place]) 10/25/2017  . Benign essential HTN   . History of TIA (transient ischemic attack)   . TIA (transient ischemic attack) 10/24/2017  . Cerebral infarction due to embolism of right middle cerebral artery ([place])   . HLD (hyperlipidemia)   . Acute ischemic right MCA stroke ([place]) 06/18/2014  . Facial droop   . Displaced fracture of shaft of left humerus 04/03/2014  . Right arm fracture 04/01/2014  . Displaced spiral fracture of shaft of humerus   . Peripheral arterial disease ([place]) 10/03/2013  . Chronic diastolic CHF (congestive heart failure) ([place]) 08/08/2013  . Chronic venous insufficiency 04/02/2013  . Hypokalemia 01/13/2013  . PAF (paroxysmal atrial fibrillation) ([place]) 10/03/2012  . CAD (coronary artery disease) of artery bypass graft 04/05/2012  . Atrial fibrillation with RVR ([place]) 03/13/2012  . Need for prophylactic vaccination and inoculation against influenza 02/14/2012  . ABDOMINAL WALL HERNIA 01/21/2010  . PARESTHESIA 05/04/2009  . SKIN LESION 01/28/2009  . Nocturia 01/28/2009  . DYSPNEA 01/01/2009  . CVA (cerebral infarction)  11/17/2008  . ALLERGY 10/27/2008  . PAROXYSMAL NOCTURNAL DYSPNEA 10/03/2008  . Osteoarthrosis, unspecified whether generalized or localized, involving lower leg 09/03/2008  . RESTRICTIVE LUNG DISEASE 05/26/2008  . Anemia 01/08/2008  . UNS ADVRS EFF OTH RX MEDICINAL&BIOLOGICAL SBSTNC 11/09/2007  . HEMATOCHEZIA 07/18/2007  . PARANOIA 06/01/2007  . Anxiety 01/23/2007  . Hypertensive heart disease with CHF ([place]) 01/23/2007  . GI BLEEDING 01/23/2007  . Osteoarthritis 01/23/2007  . DEGENERATIVE DISC DISEASE, LUMBAR SPINE 01/23/2007  . History of peptic ulcer disease 01/23/2007  . Personal history of other diseases of digestive system 01/23/2007    Orientation RESPIRATION BLADDER Height & Weight     Self, Time, Situation, Place  Normal Incontinent Weight: 136 lb (61.7 kg) Height:  5' (152.4 cm)  BEHAVIORAL SYMPTOMS/MOOD NEUROLOGICAL BOWEL NUTRITION STATUS      Continent Diet(puree solids)  AMBULATORY STATUS COMMUNICATION OF NEEDS Skin   Limited Assist Verbally Normal                       Personal Care Assistance Level of Assistance  Bathing, Feeding, Dressing Bathing Assistance: Limited assistance Feeding assistance: Limited assistance Dressing Assistance: Limited assistance     Functional Limitations Info  Sight, Hearing, Speech Sight Info: Adequate Hearing Info: Adequate Speech Info: Impaired(slurred/dysarthria, delayed responses)    SPECIAL CARE FACTORS FREQUENCY  PT (By licensed PT), OT (By licensed OT), Speech therapy     PT Frequency: 5x/wk OT Frequency: 5x/wk     Speech Therapy Frequency: 5x/wk      Contractures Contractures Info: Not present    Additional Factors  Info  Code Status, Allergies Code Status Info: DNR Allergies Info: Metoclopramide Hcl, Penicillins, Aspirin, Zetia Ezetimibe           Current Medications (11/06/2017):  This is the current hospital active medication list Current Facility-Administered Medications  Medication Dose Route  Frequency Provider Last Rate Last Dose  .  stroke: mapping our early stages of recovery book   Does not apply Once Elodia Florence., MD      . acetaminophen (TYLENOL) tablet 650 mg  650 mg Oral Q4H PRN Elodia Florence., MD   650 mg at 11/06/17 1224   Or  . acetaminophen (TYLENOL) solution 650 mg  650 mg Per Tube Q4H PRN Elodia Florence., MD       Or  . acetaminophen (TYLENOL) suppository 650 mg  650 mg Rectal Q4H PRN Elodia Florence., MD      . ALPRAZolam Duanne Moron) tablet 0.125 mg  0.125 mg Oral BID PRN Elodia Florence., MD   0.125 mg at 11/05/17 2240  . apixaban (ELIQUIS) tablet 5 mg  5 mg Oral BID Reginia Naas, RPH   5 mg at 11/06/17 1122  . diltiazem (CARDIZEM) tablet 60 mg  60 mg Oral Q8H Elodia Florence., MD   60 mg at 11/06/17 0540  . donepezil (ARICEPT) tablet 5 mg  5 mg Oral QHS Kerney Elbe, MD   5 mg at 11/05/17 2239  . metoprolol tartrate (LOPRESSOR) tablet 25 mg  25 mg Oral BID Elodia Florence., MD   25 mg at 11/06/17 1123  . pantoprazole (PROTONIX) EC tablet 40 mg  40 mg Oral Daily Elodia Florence., MD   40 mg at 11/06/17 1122     Discharge Medications: Please see discharge summary for a list of discharge medications.  Relevant Imaging Results:  Relevant Lab Results:   Additional Information SS#: [id number]  Geralynn Ochs, [place]

## 2017-11-06 NOTE — Plan of Care (Signed)
Patient stable, discussed POC with patient and family, denies question/concerns at this time.

## 2017-11-06 NOTE — Discharge Summary (Addendum)
Physician Discharge Summary  Jacqueline [name] LKG:[id number] DOB: May 27, 1928 DOA: 11/03/2017  PCP: Lajean Manes, MD  Admit date: 11/03/2017 Discharge date: 11/06/2017  Time spent: 40 minutes  Recommendations for Outpatient Follow-up:  1. Follow up CBC/CMP as outpatient 2. Ensure follow up with neurology as outpatient.  Continue aricept, avoid neuroleptics.  Recommending avoiding sedating meds (discontinued norco).  Gradually taper xanax (of note, pt will be very resistant to this, she's taken it for years - would continue to discuss as considering taper). 3. PT/OT/speech to follow at [facility].  Of note currently dysphagia 1 diet with thin liquids with meds crushed with puree (of note, metoprolol was switched to tartrate and diltiazem switched to immediate release due to this).  Transition back to previous meds as allowed by diet. 4. Follow up regarding statin for stroke, though seems she has intolerance to this on our discussion here.   Discharge Diagnoses:  Active Problems:   Altered mental status   Discharge Condition: stable  Diet recommendation: dysphagia 1, thin liquid, meds crushed with puree  Filed Weights   11/03/17 1423  Weight: 61.7 kg (136 lb)    History of present illness:  Jacqueline [name] a 84 y.o.femalewith medical history significant ofatrial fibrillation on Eliquis, hypertension, GERD, IBS, GI bleed, and recent admission for a stroke discharged on 6/29 is presenting with altered mental status.  She gradually improved with supportive care, work up was unremarkable.  She was seen by neurology who felt her picture c/w acute delirium vs cognitive fluctuation in setting of dementia, possibly lewy body given exam findings.  Recommending aricept, avoid neuroleptics, avoid sedating meds.   Hospital Course:  Acute Encephalopathy:  Appreciate neurology's assistance -> suspect acute delirium vs cognitive fluctuation in setting of dementia - lewy body dementia on ddx.  Recommending avoid  neuroleptics, start aricept, avoid sedating meds (recommended tapering xanax - this will be difficult as pt emphatic that she wants to continue this), as well as outpatient f/u with [facility].  Referral placed. No evidence of infection. UA clean. CXR without acute findings. Head CT without acute abnormality  MRI brain motion degraded, but no acute abnormality CMP and CBC unremarkable ammonia (wnl), B12 (wnl), TSH (wnl), utox (negative) Follow EEG (generalized cerebral dysfunction, no epileptiform features seen) Will resume prn xanax Speech eval -> dysphagia 1, thin liquid PT/OT  History of Stroke:recently admitted for stroke, d/c'd on 6/29 Continue eliquis  Pt refusing statin, notes myalgias with this, for now, will need to discuss further as an outpatient  Hypertension: metoprolol, diltiazem (changed to immediate release as meds crushed with puree)  Atrial fibrillation: metop, dilt, eliquis. Will d/c tele.  NSVT: follow mag, k.  Last echo with normal EF.  Will d/c tele.   Anxiety: restart xanax  Dysphagia:   Diet recommendations: Dysphagia 1 (puree);Thin liquid Liquids provided via: Cup;Straw Medication Administration: Crushed with puree Supervision: Full supervision/cueing for compensatory strategies Compensations: Slow rate;Small sips/bites;Minimize environmental distractions Oral Care Recommendations: Oral care BID Follow up Recommendations: Skilled Nursing facility SLP Visit Diagnosis: Dysphagia, unspecified (R13.10) Plan: Continue with current plan of care  Procedures: EEG Reason for the study Altered mental status  Technical Digital EEG recording using 10-20 international electrode system  Description of the recording Posterior dominant rhythm is 6-7 Hz reactive bilaterally. Intermittent generalized delta slowing Epileptiform features were not seen. Sleep architecture were not seen  Impression The EEG is abnormal and findings are  suggestive of mild to moderate generalized cerebral dysfunction. Epileptiform features were not seen during this  recording.  Consultations:  neurology  Discharge Exam: Vitals:   11/06/17 1233 11/06/17 1545  BP: 109/60 120/67  Pulse: 84 72  Resp: 16 20  Temp: 97.8 F (36.6 C) 98.2 F (36.8 C)  SpO2: 95% 95%   Feeling well.  No complaints.  A&Ox3.  General: No acute distress. Cardiovascular: Heart sounds show a regular rate, and rhythm. No gallops or rubs. No murmurs. No JVD. Lungs: Clear to auscultation bilaterally with good air movement. No rales, rhonchi or wheezes. Abdomen: Soft, nontender, nondistended with normal active bowel sounds. No masses. No hepatosplenomegaly. Neurological: Alert and oriented 3. Moves all extremities 4. Cranial nerves II through XII grossly intact. Skin: Warm and dry. No rashes or lesions. Extremities: No clubbing or cyanosis. No edema.  Psychiatric: Mood and affect are normal. Insight and judgment are appropriate.  Discharge Instructions   Discharge Instructions    Ambulatory referral to Neurology   Complete by:  As directed    An appointment is requested in approximately: 2 weeks, dementia   Call MD for:  difficulty breathing, headache or visual disturbances   Complete by:  As directed    Call MD for:  extreme fatigue   Complete by:  As directed    Call MD for:  hives   Complete by:  As directed    Call MD for:  persistant dizziness or light-headedness   Complete by:  As directed    Call MD for:  persistant nausea and vomiting   Complete by:  As directed    Call MD for:  redness, tenderness, or signs of infection (pain, swelling, redness, odor or green/yellow discharge around incision site)   Complete by:  As directed    Call MD for:  severe uncontrolled pain   Complete by:  As directed    Call MD for:  temperature >100.4   Complete by:  As directed    Discharge instructions   Complete by:  As directed    You were seen for  confusion.  It's unclear what started your confusion, but you were seen by neurology who feel you may have dementia, possibly lewy body.  You were started on aricept.    We will refer you to [facility] for your dementia.  The xanax is something you have been taking a long time, but if possible, we should try to gradually taper this.  Please follow up with your PCP and neurology about this.  We switched a few of your medicines because of your diet requiring crushed meds.  I'll have your providers at the SNF continue to follow this.  Please follow up with your PCP and neurology regarding your cholesterol medications (it sounds like you previously did not tolerate statins).  Return for new, recurrent, or worsening symptoms.  Please ask your PCP to request records from this hospitalization so they know what was done and what the next steps will be.   Increase activity slowly   Complete by:  As directed      Allergies as of 11/06/2017      Reactions   Metoclopramide Hcl Other (See Comments)    Tremors   Penicillins Itching   Has patient had a PCN reaction causing immediate rash, facial/tongue/throat swelling, SOB or lightheadedness with hypotension: Yes Has patient had a PCN reaction causing severe rash involving mucus membranes or skin necrosis: Yes Has patient had a PCN reaction that required hospitalization:No Has patient had a PCN reaction occurring within the last 10 years: No  If all of the above answers are "NO", then may proceed with Cephalosporin use.   Aspirin Other (See Comments)   "gave me stomach aches; made me bleed" (03/13/12)   Zetia [ezetimibe] Other (See Comments)   Stomach and back pain      Medication List    STOP taking these medications   atorvastatin 40 MG tablet Commonly known as:  LIPITOR   diltiazem 180 MG 24 hr capsule Commonly known as:  TIAZAC   HYDROcodone-acetaminophen 5-325 MG tablet Commonly known as:  NORCO/VICODIN    metoprolol succinate 50 MG 24 hr tablet Commonly known as:  TOPROL-XL     TAKE these medications   ALPRAZolam 0.25 MG tablet Commonly known as:  XANAX Take 0.5 tablets (0.125 mg total) by mouth 2 (two) times daily as needed for anxiety.   apixaban 5 MG Tabs tablet Commonly known as:  ELIQUIS Take 1 tablet (5 mg total) by mouth 2 (two) times daily.   bisacodyl 10 MG suppository Commonly known as:  DULCOLAX Place 10 mg rectally daily as needed (constipation not relieved by MOM).   diltiazem 60 MG tablet Commonly known as:  CARDIZEM Take 1 tablet (60 mg total) by mouth every 8 (eight) hours.   donepezil 5 MG tablet Commonly known as:  ARICEPT Take 1 tablet (5 mg total) by mouth at bedtime.   FLEET ENEMA RE Place 1 enema rectally daily as needed (constipation not relieved by bisacodyl suppository).   metoprolol tartrate 25 MG tablet Commonly known as:  LOPRESSOR Take 1 tablet (25 mg total) by mouth 2 (two) times daily.   MILK OF MAGNESIA PO Take 30 mLs by mouth daily as needed (if no BM in 3 days).   multivitamin with minerals Tabs tablet Take 1 tablet by mouth daily. Centrum Silver   pantoprazole 40 MG tablet Commonly known as:  PROTONIX TAKE 1 TABLET EVERY MORNING What changed:    how much to take  how to take this  when to take this  additional instructions   senna 8.6 MG Tabs tablet Commonly known as:  SENOKOT Take 1 tablet by mouth 2 (two) times daily.   SYSTANE OVERNIGHT THERAPY 0.3 % Gel ophthalmic ointment Generic drug:  hypromellose Place 1 application into both eyes 4 (four) times daily as needed for dry eyes.   Vitamin D3 5000 units Caps Take 5,000 Units by mouth daily.      Allergies  Allergen Reactions  . Metoclopramide Hcl Other (See Comments)     Tremors  . Penicillins Itching    Has patient had a PCN reaction causing immediate rash, facial/tongue/throat swelling, SOB or lightheadedness with hypotension: Yes Has patient had a PCN  reaction causing severe rash involving mucus membranes or skin necrosis: Yes Has patient had a PCN reaction that required hospitalization:No Has patient had a PCN reaction occurring within the last 10 years: No If all of the above answers are "NO", then may proceed with Cephalosporin use.   . Aspirin Other (See Comments)    "gave me stomach aches; made me bleed" (03/13/12)  . Zetia [Ezetimibe] Other (See Comments)    Stomach and back pain   Follow-up Information    Lajean Manes, MD Follow up.   Specialty:  Internal Medicine Contact information: [address] [place] [address] [phone]        [facility] Follow up.   Specialty:  Neurology Why:  You should receive a call for a follow up appointment, please  call if you don't hear anything in 1-2 weeks Contact information: [address] [place] [phone]           The results of significant diagnostics from this hospitalization (including imaging, microbiology, ancillary and laboratory) are listed below for reference.    Significant Diagnostic Studies: Dg Chest 2 View  Result Date: 11/03/2017 CLINICAL DATA:  Altered level of consciousness EXAM: CHEST - 2 VIEW COMPARISON:  February 13, 2017 FINDINGS: There is no appreciable edema or consolidation. Heart is mildly enlarged with pulmonary vascularity normal. No adenopathy. There is a sizable hiatal type hernia. There is aortic atherosclerosis. There is advanced arthropathy in both shoulders. IMPRESSION: No edema or consolidation. Stable cardiac prominence. Sizable hiatal hernia. Aortic atherosclerosis. Aortic Atherosclerosis (ICD10-I70.0). Electronically Signed   By: Lowella Grip III M.D.   On: 11/03/2017 15:34   Ct Head Wo Contrast  Result Date: 11/03/2017 CLINICAL DATA:  Altered mental status EXAM: CT HEAD WITHOUT CONTRAST TECHNIQUE: Contiguous axial images were obtained from the base of the  skull through the vertex without intravenous contrast. COMPARISON:  10/24/2017 FINDINGS: Brain: There is atrophy and chronic small vessel disease changes. No acute intracranial abnormality. Specifically, no hemorrhage, hydrocephalus, mass lesion, acute infarction, or significant intracranial injury. Vascular: No hyperdense vessel or unexpected calcification. Skull: No acute calvarial abnormality. Sinuses/Orbits: Visualized paranasal sinuses and mastoids clear. Orbital soft tissues unremarkable. Other: None IMPRESSION: No acute intracranial abnormality. Atrophy, chronic microvascular disease. Electronically Signed   By: Rolm Baptise M.D.   On: 11/03/2017 16:14   Ct Head Wo Contrast  Result Date: 10/24/2017 CLINICAL DATA:  84 year old female with acute confusion and aphasia. EXAM: CT HEAD WITHOUT CONTRAST TECHNIQUE: Contiguous axial images were obtained from the base of the skull through the vertex without intravenous contrast. COMPARISON:  02/13/2017 head CT and prior studies FINDINGS: Brain: An age indeterminate RIGHT thalamic infarct vs chronic ischemic changes now noted. No hemorrhage, midline shift, hydrocephalus, mass lesion or mass effect. Atrophy, chronic small-vessel white matter ischemic changes and remote cerebellar infarcts again noted. Vascular: Intracranial atherosclerotic calcifications again noted. Skull: Normal. Negative for fracture or focal lesion. Sinuses/Orbits: A small mucous retention cyst/polyp within the LEFT sphenoid sinus noted. Other: None IMPRESSION: Age indeterminate RIGHT thalamic infarct vs chronic ischemic changes. Atrophy, chronic small-vessel white matter ischemic changes and remote cerebellar infarcts. Electronically Signed   By: Margarette Canada M.D.   On: 10/24/2017 15:50   Mr Brain Wo Contrast  Result Date: 11/03/2017 CLINICAL DATA:  Altered mental status. EXAM: MRI HEAD WITHOUT CONTRAST TECHNIQUE: Multiplanar, multiecho pulse sequences of the brain and surrounding structures  were obtained without intravenous contrast. COMPARISON:  Head CT 11/03/2017 and MRI 10/24/2017 FINDINGS: Some sequences are moderately to severely motion degraded. Brain: No acute infarct, mass, midline shift, or extra-axial fluid collection is identified. Diffusion signal abnormality associated with the punctate acute right parietal infarct on the prior MRI has resolved. There is a chronic microhemorrhage in the pons. Moderate cerebral atrophy is again noted. Small chronic cortical infarcts are again seen in the occipital lobes and posterior right frontal lobe. Small chronic infarcts are also again noted in the bilateral basal ganglia, right thalamus, and cerebellum bilaterally. Patchy T2 hyperintensities throughout the cerebral white matter and pons are similar to the prior MRI and nonspecific but compatible with moderate chronic small vessel ischemic disease. Vascular: Major intracranial vascular flow voids are preserved. Skull and upper cervical spine: No suspicious marrow lesion identified. Sinuses/Orbits: Left cataract extraction. Small left  sphenoid sinus fluid level. Clear mastoid air cells. Other: None. IMPRESSION: 1. Motion degraded examination without evidence of acute intracranial abnormality. 2. Moderate chronic small vessel ischemic disease and cerebral atrophy. Electronically Signed   By: Logan Bores M.D.   On: 11/03/2017 19:43   Mr Brain Wo Contrast  Result Date: 10/24/2017 CLINICAL DATA:  84 y/o F; patient presenting with word-finding difficulty. History of stroke, TIA, hypertension, and atrial fibrillation. EXAM: MRI HEAD WITHOUT CONTRAST TECHNIQUE: Multiplanar, multiecho pulse sequences of the brain and surrounding structures were obtained without intravenous contrast. COMPARISON:  10/24/2017 CT head. 04/19/2015 MRI and MRA of the head. FINDINGS: Brain: 3 mm focus of reduced diffusion within the right posterior parietal cortex (series 5, image 68) compatible with acute/early subacute  infarction. No associated hemorrhage or mass effect. Several small chronic infarctions are present within the cerebellum bilaterally. Small chronic lacunar infarcts in the right thalamus, left lentiform nucleus, right caudate head. Small chronic cortical infarcts of the bilateral occipital lobes 6 and right frontal lobe. Numerous patchy nonspecific foci of T2 FLAIR hyperintense signal abnormality in subcortical and periventricular white matter are compatible with advanced chronic microvascular ischemic changes for age. Moderate brain parenchymal volume loss. No new susceptibility hypointensity to indicate interval intracranial hemorrhage. Vascular: Normal flow voids. Skull and upper cervical spine: Normal marrow signal. Sinuses/Orbits: Negative. Other: Left intra-ocular lens replacement. IMPRESSION: 1. 3 mm acute/early subacute infarction within the right posterior parietal cortex. No associated hemorrhage or mass effect. 2. Advanced chronic microvascular ischemic changes and moderate parenchymal volume loss of the brain. 3. Several small stable chronic infarctions within the cerebellum, basal ganglia, and right frontal cortex. These results will be called to the ordering clinician or representative by the Radiologist Assistant, and communication documented in the PACS or zVision Dashboard. Electronically Signed   By: Kristine Garbe M.D.   On: 10/24/2017 23:53    Microbiology: No results found for this or any previous visit (from the past 240 hour(s)).   Labs: Basic Metabolic Panel: Recent Labs  Lab 11/03/17 11/03/17 1432 11/04/17 0411 11/05/17 0625  NA 142 140 142 142  K 4.3 4.6 3.6 3.8  CL  --  106 106 109  CO2  --  23 25 25   GLUCOSE  --  109* 90 86  BUN 23* 26* 17 27*  CREATININE 0.9 1.14* 0.86 1.04*  CALCIUM  --  10.2 9.9 9.6  MG  --   --  2.0 2.1   Liver Function Tests: Recent Labs  Lab 11/03/17 1432  AST 28  ALT 15  ALKPHOS 77  BILITOT 1.4*  PROT 6.9  ALBUMIN 3.7    No results for input(s): LIPASE, AMYLASE in the last 168 hours. Recent Labs  Lab 11/03/17 2058  AMMONIA 11   CBC: Recent Labs  Lab 11/03/17 11/03/17 1432 11/04/17 0411 11/05/17 0625  WBC 7.0 7.4 7.9 8.8  NEUTROABS  --  5.0  --   --   HGB 15.7 15.3* 16.1* 15.5*  HCT 46 47.7* 48.4* 47.5*  MCV  --  99.0 97.6 98.5  PLT 222 260 225 256   Cardiac Enzymes: No results for input(s): CKTOTAL, CKMB, CKMBINDEX, TROPONINI in the last 168 hours. BNP: BNP (last 3 results) No results for input(s): BNP in the last 8760 hours.  ProBNP (last 3 results) No results for input(s): PROBNP in the last 8760 hours.  CBG: Recent Labs  Lab 11/03/17 1431  GLUCAP 110*       Signed:  Fayrene Helper MD.  [facility] 11/06/2017, 4:00 PM

## 2017-11-06 NOTE — Progress Notes (Signed)
Physical medicine rehab consult requested chart reviewed.  Patient is currently from [facility] skilled nursing facility.  Patient did receive rehabilitation consult 10/25/2017 after findings of right posterior parietal cortex infarction.  She adamantly refused inpatient rehab services at that time and was discharged to skilled nursing facility 10/28/2017.  Current work-up is currently ongoing.  Hold on formal rehab consult at this time pending discharge plan.

## 2017-11-06 NOTE — Progress Notes (Signed)
Physical Therapy Treatment Patient Details Name: Jacqueline [name] MRN: [id number] DOB: 05/05/1928 Today's Date: 11/06/2017    History of Present Illness 84 y.o. female with medical history significant of atrial fibrillation on Eliquis, hypertension, GERD, IBS, GI bleed, and recent admission for a stroke discharged on 6/29 is presenting with altered mental status.    PT Comments    Patient is making progress toward PT goals and demonstrates improved mentation compared to previously noted session. Recommending CIR for further skilled PT services to maximize independence and safety with mobility.     Follow Up Recommendations  CIR     Equipment Recommendations  None recommended by PT    Recommendations for Other Services Rehab consult     Precautions / Restrictions Precautions Precautions: Fall Restrictions Weight Bearing Restrictions: No    Mobility  Bed Mobility Overal bed mobility: Needs Assistance Bed Mobility: Supine to Sit     Supine to sit: Mod assist;HOB elevated     General bed mobility comments: multimodal cues for sequencing and assist to elevate trunk into sitting and scoot hips toward EOB  Transfers Overall transfer level: Needs assistance Equipment used: Rolling walker (2 wheeled) Transfers: Sit to/from Stand Sit to Stand: Mod assist         General transfer comment: assist to power up into standing and to gain balance upon standing due to posterior bias; cues for safe hand placement from EOB and BSC; pt required step by step cues for completion of transfers   Ambulation/Gait Ambulation/Gait assistance: Min assist;Mod assist Gait Distance (Feet): (~50ft total ) Assistive device: Rolling walker (2 wheeled) Gait Pattern/deviations: Shuffle;Drifts right/left;Trunk flexed;Step-through pattern;Decreased step length - right;Decreased step length - left Gait velocity: decreased   General Gait Details: cues for posture and assistance for balance and guiding RW     Stairs             Wheelchair Mobility    Modified Rankin (Stroke Patients Only)       Balance Overall balance assessment: Needs assistance;History of Falls Sitting-balance support: No upper extremity supported;Feet supported Sitting balance-Leahy Scale: Fair     Standing balance support: Bilateral upper extremity supported;During functional activity Standing balance-Leahy Scale: Poor                              Cognition Arousal/Alertness: Awake/alert Behavior During Therapy: WFL for tasks assessed/performed Overall Cognitive Status: History of cognitive impairments - at baseline                         Following Commands: Follows one step commands with increased time;Follows one step commands inconsistently              Exercises      General Comments General comments (skin integrity, edema, etc.): L LE wound with bandage; pt had BM during session       Pertinent Vitals/Pain Pain Assessment: No/denies pain    Home Living                      Prior Function            PT Goals (current goals can now be found in the care plan section) Acute Rehab PT Goals PT Goal Formulation: Patient unable to participate in goal setting Time For Goal Achievement: 11/08/17 Potential to Achieve Goals: Fair Progress towards PT goals: Progressing toward goals    Frequency  Min 2X/week      PT Plan Discharge plan needs to be updated    Co-evaluation              AM-PAC PT "6 Clicks" Daily Activity  Outcome Measure  Difficulty turning over in bed (including adjusting bedclothes, sheets and blankets)?: A Lot Difficulty moving from lying on back to sitting on the side of the bed? : Unable Difficulty sitting down on and standing up from a chair with arms (e.g., wheelchair, bedside commode, etc,.)?: Unable Help needed moving to and from a bed to chair (including a wheelchair)?: A Little Help needed walking in hospital  room?: A Little Help needed climbing 3-5 steps with a railing? : A Lot 6 Click Score: 12    End of Session Equipment Utilized During Treatment: Gait belt Activity Tolerance: Patient tolerated treatment well Patient left: in chair;with call bell/phone within reach;with chair alarm set Nurse Communication: Mobility status PT Visit Diagnosis: Unsteadiness on feet (R26.81);Difficulty in walking, not elsewhere classified (R26.2);Other symptoms and signs involving the nervous system (R29.898)     Time: 2336-1224 PT Time Calculation (min) (ACUTE ONLY): 31 min  Charges:  $Gait Training: 8-22 mins $Therapeutic Activity: 8-22 mins                    G Codes:       Jacqueline [name], PTA Pager: [phone]     Darliss Cheney 11/06/2017, 11:09 AM

## 2017-11-07 DIAGNOSIS — M2550 Pain in unspecified joint: Secondary | ICD-10-CM | POA: Diagnosis not present

## 2017-11-07 DIAGNOSIS — R4182 Altered mental status, unspecified: Secondary | ICD-10-CM | POA: Diagnosis not present

## 2017-11-07 DIAGNOSIS — G3183 Dementia with Lewy bodies: Secondary | ICD-10-CM | POA: Diagnosis not present

## 2017-11-07 DIAGNOSIS — M25512 Pain in left shoulder: Secondary | ICD-10-CM | POA: Diagnosis not present

## 2017-11-07 DIAGNOSIS — R41841 Cognitive communication deficit: Secondary | ICD-10-CM | POA: Diagnosis not present

## 2017-11-07 DIAGNOSIS — M25511 Pain in right shoulder: Secondary | ICD-10-CM | POA: Diagnosis not present

## 2017-11-07 DIAGNOSIS — F0391 Unspecified dementia with behavioral disturbance: Secondary | ICD-10-CM | POA: Diagnosis not present

## 2017-11-07 DIAGNOSIS — I5032 Chronic diastolic (congestive) heart failure: Secondary | ICD-10-CM | POA: Diagnosis not present

## 2017-11-07 DIAGNOSIS — Z79899 Other long term (current) drug therapy: Secondary | ICD-10-CM | POA: Diagnosis not present

## 2017-11-07 DIAGNOSIS — F0281 Dementia in other diseases classified elsewhere with behavioral disturbance: Secondary | ICD-10-CM | POA: Diagnosis not present

## 2017-11-07 DIAGNOSIS — I639 Cerebral infarction, unspecified: Secondary | ICD-10-CM | POA: Diagnosis not present

## 2017-11-07 DIAGNOSIS — I10 Essential (primary) hypertension: Secondary | ICD-10-CM | POA: Diagnosis not present

## 2017-11-07 DIAGNOSIS — Z7901 Long term (current) use of anticoagulants: Secondary | ICD-10-CM | POA: Diagnosis not present

## 2017-11-07 DIAGNOSIS — F419 Anxiety disorder, unspecified: Secondary | ICD-10-CM | POA: Diagnosis not present

## 2017-11-07 DIAGNOSIS — R278 Other lack of coordination: Secondary | ICD-10-CM | POA: Diagnosis not present

## 2017-11-07 DIAGNOSIS — M25561 Pain in right knee: Secondary | ICD-10-CM | POA: Diagnosis not present

## 2017-11-07 DIAGNOSIS — R2681 Unsteadiness on feet: Secondary | ICD-10-CM | POA: Diagnosis not present

## 2017-11-07 DIAGNOSIS — R1312 Dysphagia, oropharyngeal phase: Secondary | ICD-10-CM | POA: Diagnosis not present

## 2017-11-07 DIAGNOSIS — R2689 Other abnormalities of gait and mobility: Secondary | ICD-10-CM | POA: Diagnosis not present

## 2017-11-07 DIAGNOSIS — I63411 Cerebral infarction due to embolism of right middle cerebral artery: Secondary | ICD-10-CM | POA: Diagnosis not present

## 2017-11-07 DIAGNOSIS — Z7401 Bed confinement status: Secondary | ICD-10-CM | POA: Diagnosis not present

## 2017-11-07 DIAGNOSIS — M6281 Muscle weakness (generalized): Secondary | ICD-10-CM | POA: Diagnosis not present

## 2017-11-07 DIAGNOSIS — I110 Hypertensive heart disease with heart failure: Secondary | ICD-10-CM | POA: Diagnosis not present

## 2017-11-07 MED ORDER — POLYETHYLENE GLYCOL 3350 17 G PO PACK
17.0000 g | PACK | Freq: Every day | ORAL | Status: DC
  Filled 2017-11-07: qty 1

## 2017-11-07 NOTE — Progress Notes (Signed)
Discharge to: [place] Anticipated discharge date:  11/07/17 Family notified: Yes, at bedside Transportation by: [facility]  Report #: [phone], Room 104  CSW signing off.  Laveda Abbe LCSW [phone]

## 2017-11-07 NOTE — Progress Notes (Signed)
Patient discharged to [facility] place. Report called in to supervisor [phone]. Son was at the bedside and updated.

## 2017-11-07 NOTE — Progress Notes (Signed)
  Speech Language Pathology Treatment: Dysphagia  Patient Details Name: Jacqueline [name] MRN: [id number] DOB: 1928/12/05 Today's Date: 11/07/2017 Time: [id number] SLP Time Calculation (min) (ACUTE ONLY): 15 min  Assessment / Plan / Recommendation Clinical Impression  Pt oriented to place; asking questions re: medicine receiving from RN. Evidence of continued confusion however appears improved from prior notes. Just completed breakfast and wet vocal quality noted, cleared with cues to clear throat. Min delayed throat clears with straw sips thin after large consecutive sips requiring verbal cues for smaller sips. Cleaned upper dentures and added more Poligrip to keep them in position. Masticated upgraded Dys 3 texture adequately and clearing oral cavity independently. Given dentures that intermittently become loose and cognitive status will slowly upgrade to Dys 2, continue thin. Continue ST at discharge.    HPI HPI: Jacqueline [name] is a 84 y.o. female with medical history significant of atrial fibrillation on Eliquis, hypertension, GERD, IBS, GI bleed, and recent admission for a stroke discharged on 6/29 admitted with acute encephalopathy of unclear etiology. CT, MRI without acute findings.       SLP Plan  Continue with current plan of care       Recommendations  Diet recommendations: Dysphagia 2 (fine chop) Liquids provided via: Cup;Straw Medication Administration: Crushed with puree Supervision: Patient able to self feed;Intermittent supervision to cue for compensatory strategies Compensations: Slow rate;Small sips/bites;Minimize environmental distractions;Lingual sweep for clearance of pocketing Postural Changes and/or Swallow Maneuvers: Seated upright 90 degrees                Oral Care Recommendations: Oral care BID Follow up Recommendations: Skilled Nursing facility SLP Visit Diagnosis: Dysphagia, unspecified (R13.10) Plan: Continue with current plan of care       GO                 Houston Siren 11/07/2017, 9:27 AM   Orbie Pyo Colvin Caroli.[employer] [phone]

## 2017-11-07 NOTE — Progress Notes (Signed)
CSW following for discharge plan. Patient's family had been reviewing facility choices. CSW contacted [facility]; [facility] updated that they are unable to offer a bed. By the time the family had chosen other facilities, it was after 5 and we were unable to find a SNF where the patient would be able to admit to today. Patient's family has chosen [facility], and they are able to admit the patient first thing in the morning. Unable to admit today as their admission nurse has left for the day and they will be unable to get the patient's medications from their pharmacy after 5 pm.  CSW updated MD. CSW to continue to follow.  Laveda Abbe, [place] Clinical Social Worker [phone]

## 2017-11-07 NOTE — Discharge Summary (Addendum)
Physician Discharge Summary  Jacqueline [name] OEU:[id number] DOB: 03-Apr-1929 DOA: 11/03/2017  PCP: Lajean Manes, MD  Admit date: 11/03/2017 Discharge date: 11/07/2017  Time spent: 40 minutes  Recommendations for Outpatient Follow-up:  1. Follow up CBC/CMP as outpatient 2. Ensure follow up with neurology as outpatient.  Continue aricept, avoid neuroleptics.  Recommending avoiding sedating meds (discontinued norco).  Gradually taper xanax (of note, pt will be very resistant to this, she's taken it for years - would continue to discuss as considering taper). 3. PT/OT/speech to follow at [facility].  Of note currently dysphagia 2 diet (fine chop) with thin liquids with meds crushed with puree (of note, metoprolol was switched to tartrate and diltiazem switched to immediate release due to this).  Transition back to previous meds as allowed by diet. 4. Follow up regarding statin for stroke, though seems she has intolerance to this on our discussion here.   Discharge Diagnoses:  Active Problems:   Altered mental status   Discharge Condition: stable  Diet recommendation: dysphagia 2 (fine chop), thin liquid, meds crushed with puree  Filed Weights   11/03/17 1423  Weight: 61.7 kg (136 lb)    History of present illness:  Jacqueline [name] 84 y.o.femalewith medical history significant ofatrial fibrillation on Eliquis, hypertension, GERD, IBS, GI bleed, and recent admission for Jhony [place] stroke discharged on 6/29 is presenting with altered mental status.  She gradually improved with supportive care, work up was unremarkable.  She was seen by neurology who felt her picture c/w acute delirium vs cognitive fluctuation in setting of dementia, possibly lewy body given exam findings.  Recommending aricept, avoid neuroleptics, avoid sedating meds.   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  Recommending avoid neuroleptics, start aricept, avoid sedating meds (recommended tapering xanax - this will be difficult as pt emphatic that she wants to continue this), as well as outpatient f/u with [facility].  Referral placed. No evidence of infection. UA clean. CXR without acute findings. Head CT without acute abnormality  MRI brain motion degraded, but no acute abnormality CMP and CBC unremarkable ammonia (wnl), B12 (wnl), TSH (wnl), utox (negative) Follow EEG (generalized cerebral dysfunction, no epileptiform features seen) Will resume prn xanax Speech eval -> dysphagia 1, thin liquid PT/OT  History of Stroke:recently admitted for stroke, d/c'd on 6/29 Continue eliquis  Pt refusing statin, notes myalgias with this, for now, will need to discuss further as an outpatient  Hypertension: metoprolol, diltiazem (changed to immediate release as meds crushed with puree)  Atrial fibrillation: metop, dilt, eliquis. Will d/c tele.  NSVT: follow mag, k.  Last echo with normal EF.  Will d/c tele.   Anxiety: restart xanax  Dysphagia:   Diet recommendations: Dysphagia 2 (fine chop) Liquids provided via: Cup;Straw Medication Administration: Crushed with puree Supervision: Patient able to self feed;Intermittent supervision to cue for compensatory strategies Compensations: Slow rate;Small sips/bites;Minimize environmental distractions;Lingual sweep for clearance of pocketing Postural Changes and/or Swallow Maneuvers: Seated upright 90 degrees     Oral Care Recommendations: Oral care BID Follow up Recommendations: Skilled Nursing facility SLP Visit Diagnosis: Dysphagia, unspecified (R13.10) Plan: Continue with current plan of care      Procedures: EEG Reason for the study Altered mental status  Technical Digital EEG recording using 10-20 international electrode system  Description of the recording Posterior dominant rhythm is 6-7 Hz reactive  bilaterally. Intermittent generalized delta slowing Epileptiform features  were not seen. Sleep architecture were not seen  Impression The EEG is abnormal and findings are suggestive of mild to moderate generalized cerebral dysfunction. Epileptiform features were not seen during this recording.  Consultations:  neurology  Discharge Exam: Vitals:   11/07/17 0421 11/07/17 0850  BP: (!) 144/78 131/77  Pulse: 70 74  Resp: 18 20  Temp: 98.2 F (36.8 C) 98 F (36.7 C)  SpO2: 96% 97%   Doing well today.  No complaints.  Discharged yesterday, but SNF placement today. No concerns in interim.  General: No acute distress. Cardiovascular: Heart sounds show Caytlin Better regular rate, and rhythm. Lungs: Clear to auscultation bilaterally with good air movement.  Abdomen: Soft, nontender, nondistended  Neurological: Alert and oriented 3. Moves all extremities 4. Cranial nerves II through XII grossly intact. Skin: Warm and dry. No rashes or lesions. Extremities: No clubbing or cyanosis. No edema.  Psychiatric: Mood and affect are normal. Insight and judgment are appropriate.   Discharge Instructions   Discharge Instructions    Ambulatory referral to Neurology   Complete by:  As directed    An appointment is requested in approximately: 2 weeks, dementia   Call MD for:  difficulty breathing, headache or visual disturbances   Complete by:  As directed    Call MD for:  extreme fatigue   Complete by:  As directed    Call MD for:  hives   Complete by:  As directed    Call MD for:  persistant dizziness or light-headedness   Complete by:  As directed    Call MD for:  persistant nausea and vomiting   Complete by:  As directed    Call MD for:  redness, tenderness, or signs of infection (pain, swelling, redness, odor or green/yellow discharge around incision site)   Complete by:  As directed    Call MD for:  severe uncontrolled pain   Complete by:  As directed    Call MD for:  temperature >100.4    Complete by:  As directed    Discharge instructions   Complete by:  As directed    You were seen for confusion.  It's unclear what started your confusion, but you were seen by neurology who feel you may have dementia, possibly lewy body.  You were started on aricept.    We will refer you to [facility] for your dementia.  The xanax is something you have been taking Alysen Smylie long time, but if possible, we should try to gradually taper this.  Please follow up with your PCP and neurology about this.  We switched Emmett Arntz few of your medicines because of your diet requiring crushed meds.  I'll have your providers at the SNF continue to follow this.  Please follow up with your PCP and neurology regarding your cholesterol medications (it sounds like you previously did not tolerate statins).  Return for new, recurrent, or worsening symptoms.  Please ask your PCP to request records from this hospitalization so they know what was done and what the next steps will be.   Increase activity slowly   Complete by:  As directed      Allergies as of 11/07/2017      Reactions   Metoclopramide Hcl Other (See Comments)    Tremors   Penicillins Itching   Has patient had Novah Nessel PCN reaction causing immediate rash, facial/tongue/throat swelling, SOB or lightheadedness with hypotension: Yes Has patient had Lochlann Mastrangelo PCN reaction causing severe rash involving mucus membranes or skin  necrosis: Yes Has patient had Wah Sabic PCN reaction that required hospitalization:No Has patient had Stephaney Steven PCN reaction occurring within the last 10 years: No If all of the above answers are "NO", then may proceed with Cephalosporin use.   Aspirin Other (See Comments)   "gave me stomach aches; made me bleed" (03/13/12)   Zetia [ezetimibe] Other (See Comments)   Stomach and back pain      Medication List    STOP taking these medications   atorvastatin 40 MG tablet Commonly known as:  LIPITOR   diltiazem 180 MG 24 hr capsule Commonly  known as:  TIAZAC   HYDROcodone-acetaminophen 5-325 MG tablet Commonly known as:  NORCO/VICODIN   metoprolol succinate 50 MG 24 hr tablet Commonly known as:  TOPROL-XL     TAKE these medications   ALPRAZolam 0.25 MG tablet Commonly known as:  XANAX Take 0.5 tablets (0.125 mg total) by mouth 2 (two) times daily as needed for anxiety.   apixaban 5 MG Tabs tablet Commonly known as:  ELIQUIS Take 1 tablet (5 mg total) by mouth 2 (two) times daily.   bisacodyl 10 MG suppository Commonly known as:  DULCOLAX Place 10 mg rectally daily as needed (constipation not relieved by MOM).   diltiazem 60 MG tablet Commonly known as:  CARDIZEM Take 1 tablet (60 mg total) by mouth every 8 (eight) hours.   donepezil 5 MG tablet Commonly known as:  ARICEPT Take 1 tablet (5 mg total) by mouth at bedtime.   FLEET ENEMA RE Place 1 enema rectally daily as needed (constipation not relieved by bisacodyl suppository).   metoprolol tartrate 25 MG tablet Commonly known as:  LOPRESSOR Take 1 tablet (25 mg total) by mouth 2 (two) times daily.   MILK OF MAGNESIA PO Take 30 mLs by mouth daily as needed (if no BM in 3 days).   multivitamin with minerals Tabs tablet Take 1 tablet by mouth daily. Centrum Silver   pantoprazole 40 MG tablet Commonly known as:  PROTONIX TAKE 1 TABLET EVERY MORNING What changed:    how much to take  how to take this  when to take this  additional instructions   senna 8.6 MG Tabs tablet Commonly known as:  SENOKOT Take 1 tablet by mouth 2 (two) times daily.   SYSTANE OVERNIGHT THERAPY 0.3 % Gel ophthalmic ointment Generic drug:  hypromellose Place 1 application into both eyes 4 (four) times daily as needed for dry eyes.   Vitamin D3 5000 units Caps Take 5,000 Units by mouth daily.      Allergies  Allergen Reactions  . Metoclopramide Hcl Other (See Comments)     Tremors  . Penicillins Itching    Has patient had Valdis Bevill PCN reaction causing immediate rash,  facial/tongue/throat swelling, SOB or lightheadedness with hypotension: Yes Has patient had Zeev Deakins PCN reaction causing severe rash involving mucus membranes or skin necrosis: Yes Has patient had Roger Kettles PCN reaction that required hospitalization:No Has patient had Sotirios Navarro PCN reaction occurring within the last 10 years: No If all of the above answers are "NO", then may proceed with Cephalosporin use.   . Aspirin Other (See Comments)    "gave me stomach aches; made me bleed" (03/13/12)  . Zetia [Ezetimibe] Other (See Comments)    Stomach and back pain   Follow-up Information    Lajean Manes, MD Follow up.   Specialty:  Internal Medicine Contact information: [address] [place] [address] [phone]  [facility] Follow up.   Specialty:  Neurology Why:  You should receive Porter Nakama call for Margarita Bobrowski follow up appointment, please call if you don't hear anything in 1-2 weeks Contact information: [address] [place] [phone]           The results of significant diagnostics from this hospitalization (including imaging, microbiology, ancillary and laboratory) are listed below for reference.    Significant Diagnostic Studies: Dg Chest 2 View  Result Date: 11/03/2017 CLINICAL DATA:  Altered level of consciousness EXAM: CHEST - 2 VIEW COMPARISON:  February 13, 2017 FINDINGS: There is no appreciable edema or consolidation. Heart is mildly enlarged with pulmonary vascularity normal. No adenopathy. There is Shaneya Taketa sizable hiatal type hernia. There is aortic atherosclerosis. There is advanced arthropathy in both shoulders. IMPRESSION: No edema or consolidation. Stable cardiac prominence. Sizable hiatal hernia. Aortic atherosclerosis. Aortic Atherosclerosis (ICD10-I70.0). Electronically Signed   By: Lowella Grip III M.D.   On: 11/03/2017 15:34   Ct Head Wo Contrast  Result Date: 11/03/2017 CLINICAL DATA:  Altered mental status  EXAM: CT HEAD WITHOUT CONTRAST TECHNIQUE: Contiguous axial images were obtained from the base of the skull through the vertex without intravenous contrast. COMPARISON:  10/24/2017 FINDINGS: Brain: There is atrophy and chronic small vessel disease changes. No acute intracranial abnormality. Specifically, no hemorrhage, hydrocephalus, mass lesion, acute infarction, or significant intracranial injury. Vascular: No hyperdense vessel or unexpected calcification. Skull: No acute calvarial abnormality. Sinuses/Orbits: Visualized paranasal sinuses and mastoids clear. Orbital soft tissues unremarkable. Other: None IMPRESSION: No acute intracranial abnormality. Atrophy, chronic microvascular disease. Electronically Signed   By: Rolm Baptise M.D.   On: 11/03/2017 16:14   Ct Head Wo Contrast  Result Date: 10/24/2017 CLINICAL DATA:  84 year old female with acute confusion and aphasia. EXAM: CT HEAD WITHOUT CONTRAST TECHNIQUE: Contiguous axial images were obtained from the base of the skull through the vertex without intravenous contrast. COMPARISON:  02/13/2017 head CT and prior studies FINDINGS: Brain: An age indeterminate RIGHT thalamic infarct vs chronic ischemic changes now noted. No hemorrhage, midline shift, hydrocephalus, mass lesion or mass effect. Atrophy, chronic small-vessel white matter ischemic changes and remote cerebellar infarcts again noted. Vascular: Intracranial atherosclerotic calcifications again noted. Skull: Normal. Negative for fracture or focal lesion. Sinuses/Orbits: Annamary Buschman small mucous retention cyst/polyp within the LEFT sphenoid sinus noted. Other: None IMPRESSION: Age indeterminate RIGHT thalamic infarct vs chronic ischemic changes. Atrophy, chronic small-vessel white matter ischemic changes and remote cerebellar infarcts. Electronically Signed   By: Margarette Canada M.D.   On: 10/24/2017 15:50   Mr Brain Wo Contrast  Result Date: 11/03/2017 CLINICAL DATA:  Altered mental status. EXAM: MRI HEAD WITHOUT  CONTRAST TECHNIQUE: Multiplanar, multiecho pulse sequences of the brain and surrounding structures were obtained without intravenous contrast. COMPARISON:  Head CT 11/03/2017 and MRI 10/24/2017 FINDINGS: Some sequences are moderately to severely motion degraded. Brain: No acute infarct, mass, midline shift, or extra-axial fluid collection is identified. Diffusion signal abnormality associated with the punctate acute right parietal infarct on the prior MRI has resolved. There is Zaide Mcclenahan chronic microhemorrhage in the pons. Moderate cerebral atrophy is again noted. Small chronic cortical infarcts are again seen in the occipital lobes and posterior right frontal lobe. Small chronic infarcts are also again noted in the bilateral basal ganglia, right thalamus, and cerebellum bilaterally. Patchy T2 hyperintensities throughout the cerebral white matter and pons are similar to the prior MRI and nonspecific but compatible with moderate chronic small vessel ischemic disease. Vascular:  Major intracranial vascular flow voids are preserved. Skull and upper cervical spine: No suspicious marrow lesion identified. Sinuses/Orbits: Left cataract extraction. Small left sphenoid sinus fluid level. Clear mastoid air cells. Other: None. IMPRESSION: 1. Motion degraded examination without evidence of acute intracranial abnormality. 2. Moderate chronic small vessel ischemic disease and cerebral atrophy. Electronically Signed   By: Logan Bores M.D.   On: 11/03/2017 19:43   Mr Brain Wo Contrast  Result Date: 10/24/2017 CLINICAL DATA:  84 y/o F; patient presenting with word-finding difficulty. History of stroke, TIA, hypertension, and atrial fibrillation. EXAM: MRI HEAD WITHOUT CONTRAST TECHNIQUE: Multiplanar, multiecho pulse sequences of the brain and surrounding structures were obtained without intravenous contrast. COMPARISON:  10/24/2017 CT head. 04/19/2015 MRI and MRA of the head. FINDINGS: Brain: 3 mm focus of reduced diffusion within the  right posterior parietal cortex (series 5, image 68) compatible with acute/early subacute infarction. No associated hemorrhage or mass effect. Several small chronic infarctions are present within the cerebellum bilaterally. Small chronic lacunar infarcts in the right thalamus, left lentiform nucleus, right caudate head. Small chronic cortical infarcts of the bilateral occipital lobes 6 and right frontal lobe. Numerous patchy nonspecific foci of T2 FLAIR hyperintense signal abnormality in subcortical and periventricular white matter are compatible with advanced chronic microvascular ischemic changes for age. Moderate brain parenchymal volume loss. No new susceptibility hypointensity to indicate interval intracranial hemorrhage. Vascular: Normal flow voids. Skull and upper cervical spine: Normal marrow signal. Sinuses/Orbits: Negative. Other: Left intra-ocular lens replacement. IMPRESSION: 1. 3 mm acute/early subacute infarction within the right posterior parietal cortex. No associated hemorrhage or mass effect. 2. Advanced chronic microvascular ischemic changes and moderate parenchymal volume loss of the brain. 3. Several small stable chronic infarctions within the cerebellum, basal ganglia, and right frontal cortex. These results will be called to the ordering clinician or representative by the Radiologist Assistant, and communication documented in the PACS or zVision Dashboard. Electronically Signed   By: Kristine Garbe M.D.   On: 10/24/2017 23:53    Microbiology: No results found for this or any previous visit (from the past 240 hour(s)).   Labs: Basic Metabolic Panel: Recent Labs  Lab 11/03/17 11/03/17 1432 11/04/17 0411 11/05/17 0625  NA 142 140 142 142  K 4.3 4.6 3.6 3.8  CL  --  106 106 109  CO2  --  23 25 25   GLUCOSE  --  109* 90 86  BUN 23* 26* 17 27*  CREATININE 0.9 1.14* 0.86 1.04*  CALCIUM  --  10.2 9.9 9.6  MG  --   --  2.0 2.1   Liver Function Tests: Recent Labs  Lab  11/03/17 1432  AST 28  ALT 15  ALKPHOS 77  BILITOT 1.4*  PROT 6.9  ALBUMIN 3.7   No results for input(s): LIPASE, AMYLASE in the last 168 hours. Recent Labs  Lab 11/03/17 2058  AMMONIA 11   CBC: Recent Labs  Lab 11/03/17 11/03/17 1432 11/04/17 0411 11/05/17 0625  WBC 7.0 7.4 7.9 8.8  NEUTROABS  --  5.0  --   --   HGB 15.7 15.3* 16.1* 15.5*  HCT 46 47.7* 48.4* 47.5*  MCV  --  99.0 97.6 98.5  PLT 222 260 225 256   Cardiac Enzymes: No results for input(s): CKTOTAL, CKMB, CKMBINDEX, TROPONINI in the last 168 hours. BNP: BNP (last 3 results) No results for input(s): BNP in the last 8760 hours.  ProBNP (last 3 results) No results for input(s): PROBNP in the last 8760  hours.  CBG: Recent Labs  Lab 11/03/17 1431  GLUCAP 110*       Signed:  Fayrene Helper MD.  [facility] 11/07/2017, 10:21 AM

## 2017-11-08 DIAGNOSIS — G3183 Dementia with Lewy bodies: Secondary | ICD-10-CM | POA: Diagnosis not present

## 2017-11-08 DIAGNOSIS — I639 Cerebral infarction, unspecified: Secondary | ICD-10-CM | POA: Diagnosis not present

## 2017-11-08 DIAGNOSIS — F0281 Dementia in other diseases classified elsewhere with behavioral disturbance: Secondary | ICD-10-CM | POA: Diagnosis not present

## 2017-11-08 DIAGNOSIS — I10 Essential (primary) hypertension: Secondary | ICD-10-CM | POA: Diagnosis not present

## 2017-11-09 DIAGNOSIS — R2689 Other abnormalities of gait and mobility: Secondary | ICD-10-CM | POA: Diagnosis not present

## 2017-11-09 DIAGNOSIS — M25561 Pain in right knee: Secondary | ICD-10-CM | POA: Diagnosis not present

## 2017-11-09 DIAGNOSIS — M25512 Pain in left shoulder: Secondary | ICD-10-CM | POA: Diagnosis not present

## 2017-11-09 DIAGNOSIS — F0391 Unspecified dementia with behavioral disturbance: Secondary | ICD-10-CM | POA: Diagnosis not present

## 2017-11-09 DIAGNOSIS — M25511 Pain in right shoulder: Secondary | ICD-10-CM | POA: Diagnosis not present

## 2017-11-18 ENCOUNTER — Encounter: Payer: Self-pay | Admitting: Internal Medicine

## 2017-11-20 DIAGNOSIS — M1711 Unilateral primary osteoarthritis, right knee: Secondary | ICD-10-CM | POA: Diagnosis not present

## 2017-11-20 DIAGNOSIS — I69322 Dysarthria following cerebral infarction: Secondary | ICD-10-CM | POA: Diagnosis not present

## 2017-11-20 DIAGNOSIS — M19012 Primary osteoarthritis, left shoulder: Secondary | ICD-10-CM | POA: Diagnosis not present

## 2017-11-20 DIAGNOSIS — M19011 Primary osteoarthritis, right shoulder: Secondary | ICD-10-CM | POA: Diagnosis not present

## 2017-11-20 DIAGNOSIS — M160 Bilateral primary osteoarthritis of hip: Secondary | ICD-10-CM | POA: Diagnosis not present

## 2017-11-20 DIAGNOSIS — I129 Hypertensive chronic kidney disease with stage 1 through stage 4 chronic kidney disease, or unspecified chronic kidney disease: Secondary | ICD-10-CM | POA: Diagnosis not present

## 2017-11-23 DIAGNOSIS — I129 Hypertensive chronic kidney disease with stage 1 through stage 4 chronic kidney disease, or unspecified chronic kidney disease: Secondary | ICD-10-CM | POA: Diagnosis not present

## 2017-11-23 DIAGNOSIS — I69322 Dysarthria following cerebral infarction: Secondary | ICD-10-CM | POA: Diagnosis not present

## 2017-11-23 DIAGNOSIS — M1711 Unilateral primary osteoarthritis, right knee: Secondary | ICD-10-CM | POA: Diagnosis not present

## 2017-11-23 DIAGNOSIS — M19012 Primary osteoarthritis, left shoulder: Secondary | ICD-10-CM | POA: Diagnosis not present

## 2017-11-23 DIAGNOSIS — M19011 Primary osteoarthritis, right shoulder: Secondary | ICD-10-CM | POA: Diagnosis not present

## 2017-11-23 DIAGNOSIS — M160 Bilateral primary osteoarthritis of hip: Secondary | ICD-10-CM | POA: Diagnosis not present

## 2017-11-28 ENCOUNTER — Ambulatory Visit: Payer: Medicare Other | Admitting: Adult Health

## 2017-11-28 DIAGNOSIS — M19012 Primary osteoarthritis, left shoulder: Secondary | ICD-10-CM | POA: Diagnosis not present

## 2017-11-28 DIAGNOSIS — I129 Hypertensive chronic kidney disease with stage 1 through stage 4 chronic kidney disease, or unspecified chronic kidney disease: Secondary | ICD-10-CM | POA: Diagnosis not present

## 2017-11-28 DIAGNOSIS — M19011 Primary osteoarthritis, right shoulder: Secondary | ICD-10-CM | POA: Diagnosis not present

## 2017-11-28 DIAGNOSIS — M1711 Unilateral primary osteoarthritis, right knee: Secondary | ICD-10-CM | POA: Diagnosis not present

## 2017-11-28 DIAGNOSIS — I69322 Dysarthria following cerebral infarction: Secondary | ICD-10-CM | POA: Diagnosis not present

## 2017-11-28 DIAGNOSIS — M160 Bilateral primary osteoarthritis of hip: Secondary | ICD-10-CM | POA: Diagnosis not present

## 2017-11-30 DIAGNOSIS — M1711 Unilateral primary osteoarthritis, right knee: Secondary | ICD-10-CM | POA: Diagnosis not present

## 2017-11-30 DIAGNOSIS — M160 Bilateral primary osteoarthritis of hip: Secondary | ICD-10-CM | POA: Diagnosis not present

## 2017-11-30 DIAGNOSIS — I69322 Dysarthria following cerebral infarction: Secondary | ICD-10-CM | POA: Diagnosis not present

## 2017-11-30 DIAGNOSIS — I129 Hypertensive chronic kidney disease with stage 1 through stage 4 chronic kidney disease, or unspecified chronic kidney disease: Secondary | ICD-10-CM | POA: Diagnosis not present

## 2017-11-30 DIAGNOSIS — M19012 Primary osteoarthritis, left shoulder: Secondary | ICD-10-CM | POA: Diagnosis not present

## 2017-11-30 DIAGNOSIS — M19011 Primary osteoarthritis, right shoulder: Secondary | ICD-10-CM | POA: Diagnosis not present

## 2017-12-04 DIAGNOSIS — G8929 Other chronic pain: Secondary | ICD-10-CM | POA: Diagnosis not present

## 2017-12-04 DIAGNOSIS — I482 Chronic atrial fibrillation: Secondary | ICD-10-CM | POA: Diagnosis not present

## 2017-12-04 DIAGNOSIS — M25561 Pain in right knee: Secondary | ICD-10-CM | POA: Diagnosis not present

## 2017-12-04 DIAGNOSIS — H6122 Impacted cerumen, left ear: Secondary | ICD-10-CM | POA: Diagnosis not present

## 2017-12-04 DIAGNOSIS — I10 Essential (primary) hypertension: Secondary | ICD-10-CM | POA: Diagnosis not present

## 2017-12-04 DIAGNOSIS — Z79899 Other long term (current) drug therapy: Secondary | ICD-10-CM | POA: Diagnosis not present

## 2017-12-04 DIAGNOSIS — F419 Anxiety disorder, unspecified: Secondary | ICD-10-CM | POA: Diagnosis not present

## 2017-12-04 DIAGNOSIS — K469 Unspecified abdominal hernia without obstruction or gangrene: Secondary | ICD-10-CM | POA: Diagnosis not present

## 2017-12-04 DIAGNOSIS — I129 Hypertensive chronic kidney disease with stage 1 through stage 4 chronic kidney disease, or unspecified chronic kidney disease: Secondary | ICD-10-CM | POA: Diagnosis not present

## 2017-12-04 DIAGNOSIS — N183 Chronic kidney disease, stage 3 (moderate): Secondary | ICD-10-CM | POA: Diagnosis not present

## 2017-12-04 DIAGNOSIS — J309 Allergic rhinitis, unspecified: Secondary | ICD-10-CM | POA: Diagnosis not present

## 2017-12-05 DIAGNOSIS — M19012 Primary osteoarthritis, left shoulder: Secondary | ICD-10-CM | POA: Diagnosis not present

## 2017-12-05 DIAGNOSIS — I69322 Dysarthria following cerebral infarction: Secondary | ICD-10-CM | POA: Diagnosis not present

## 2017-12-05 DIAGNOSIS — M160 Bilateral primary osteoarthritis of hip: Secondary | ICD-10-CM | POA: Diagnosis not present

## 2017-12-05 DIAGNOSIS — M19011 Primary osteoarthritis, right shoulder: Secondary | ICD-10-CM | POA: Diagnosis not present

## 2017-12-05 DIAGNOSIS — I129 Hypertensive chronic kidney disease with stage 1 through stage 4 chronic kidney disease, or unspecified chronic kidney disease: Secondary | ICD-10-CM | POA: Diagnosis not present

## 2017-12-05 DIAGNOSIS — M1711 Unilateral primary osteoarthritis, right knee: Secondary | ICD-10-CM | POA: Diagnosis not present

## 2017-12-07 DIAGNOSIS — M19012 Primary osteoarthritis, left shoulder: Secondary | ICD-10-CM | POA: Diagnosis not present

## 2017-12-07 DIAGNOSIS — I129 Hypertensive chronic kidney disease with stage 1 through stage 4 chronic kidney disease, or unspecified chronic kidney disease: Secondary | ICD-10-CM | POA: Diagnosis not present

## 2017-12-07 DIAGNOSIS — M1711 Unilateral primary osteoarthritis, right knee: Secondary | ICD-10-CM | POA: Diagnosis not present

## 2017-12-07 DIAGNOSIS — M19011 Primary osteoarthritis, right shoulder: Secondary | ICD-10-CM | POA: Diagnosis not present

## 2017-12-07 DIAGNOSIS — M160 Bilateral primary osteoarthritis of hip: Secondary | ICD-10-CM | POA: Diagnosis not present

## 2017-12-07 DIAGNOSIS — I69322 Dysarthria following cerebral infarction: Secondary | ICD-10-CM | POA: Diagnosis not present

## 2017-12-12 DIAGNOSIS — M160 Bilateral primary osteoarthritis of hip: Secondary | ICD-10-CM | POA: Diagnosis not present

## 2017-12-12 DIAGNOSIS — M1711 Unilateral primary osteoarthritis, right knee: Secondary | ICD-10-CM | POA: Diagnosis not present

## 2017-12-12 DIAGNOSIS — M19011 Primary osteoarthritis, right shoulder: Secondary | ICD-10-CM | POA: Diagnosis not present

## 2017-12-12 DIAGNOSIS — I69322 Dysarthria following cerebral infarction: Secondary | ICD-10-CM | POA: Diagnosis not present

## 2017-12-12 DIAGNOSIS — M19012 Primary osteoarthritis, left shoulder: Secondary | ICD-10-CM | POA: Diagnosis not present

## 2017-12-12 DIAGNOSIS — I129 Hypertensive chronic kidney disease with stage 1 through stage 4 chronic kidney disease, or unspecified chronic kidney disease: Secondary | ICD-10-CM | POA: Diagnosis not present

## 2017-12-14 DIAGNOSIS — M19011 Primary osteoarthritis, right shoulder: Secondary | ICD-10-CM | POA: Diagnosis not present

## 2017-12-14 DIAGNOSIS — I129 Hypertensive chronic kidney disease with stage 1 through stage 4 chronic kidney disease, or unspecified chronic kidney disease: Secondary | ICD-10-CM | POA: Diagnosis not present

## 2017-12-14 DIAGNOSIS — M19012 Primary osteoarthritis, left shoulder: Secondary | ICD-10-CM | POA: Diagnosis not present

## 2017-12-14 DIAGNOSIS — I69322 Dysarthria following cerebral infarction: Secondary | ICD-10-CM | POA: Diagnosis not present

## 2017-12-14 DIAGNOSIS — M160 Bilateral primary osteoarthritis of hip: Secondary | ICD-10-CM | POA: Diagnosis not present

## 2017-12-14 DIAGNOSIS — M1711 Unilateral primary osteoarthritis, right knee: Secondary | ICD-10-CM | POA: Diagnosis not present

## 2018-01-31 DIAGNOSIS — Z23 Encounter for immunization: Secondary | ICD-10-CM | POA: Diagnosis not present

## 2018-03-27 DIAGNOSIS — M1711 Unilateral primary osteoarthritis, right knee: Secondary | ICD-10-CM | POA: Diagnosis not present

## 2018-03-27 DIAGNOSIS — M179 Osteoarthritis of knee, unspecified: Secondary | ICD-10-CM | POA: Diagnosis not present

## 2018-03-27 DIAGNOSIS — M170 Bilateral primary osteoarthritis of knee: Secondary | ICD-10-CM | POA: Diagnosis not present

## 2018-03-27 DIAGNOSIS — M25562 Pain in left knee: Secondary | ICD-10-CM | POA: Diagnosis not present

## 2018-03-27 DIAGNOSIS — M25561 Pain in right knee: Secondary | ICD-10-CM | POA: Diagnosis not present

## 2018-08-28 DIAGNOSIS — K921 Melena: Secondary | ICD-10-CM | POA: Diagnosis not present

## 2018-08-28 DIAGNOSIS — G8929 Other chronic pain: Secondary | ICD-10-CM | POA: Diagnosis not present

## 2018-08-28 DIAGNOSIS — N183 Chronic kidney disease, stage 3 (moderate): Secondary | ICD-10-CM | POA: Diagnosis not present

## 2018-08-28 DIAGNOSIS — I129 Hypertensive chronic kidney disease with stage 1 through stage 4 chronic kidney disease, or unspecified chronic kidney disease: Secondary | ICD-10-CM | POA: Diagnosis not present

## 2018-08-28 DIAGNOSIS — I482 Chronic atrial fibrillation, unspecified: Secondary | ICD-10-CM | POA: Diagnosis not present

## 2018-08-28 DIAGNOSIS — M25561 Pain in right knee: Secondary | ICD-10-CM | POA: Diagnosis not present

## 2018-10-18 IMAGING — CT CT HEAD W/O CM
3 series · 16 of 47 positions shown, 19 images · non-contrast
Comparison: 02/13/2017 head CT and prior studies

CLINICAL DATA: 89-year-old female with acute confusion and aphasia.

EXAM:
CT HEAD WITHOUT CONTRAST
TECHNIQUE: Contiguous axial images were obtained from the base of the skull
through the vertex without intravenous contrast.

[Series 3: head 5.0 h30s · axial · 0.42mm/px · z∈[-195,-55]mm · 10 of 34 slices shown, 13 images]
[im 3/34  brain]
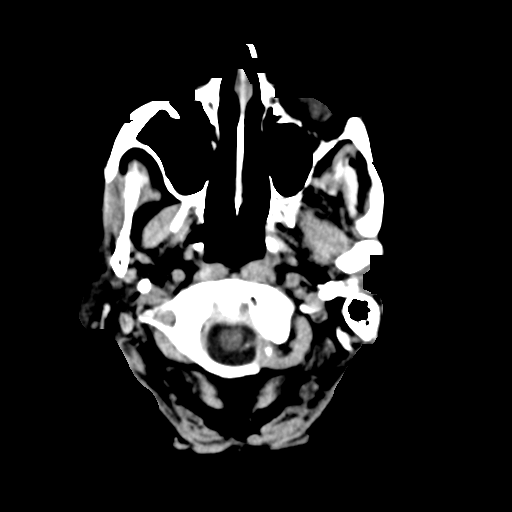
[im 3/34  bone]
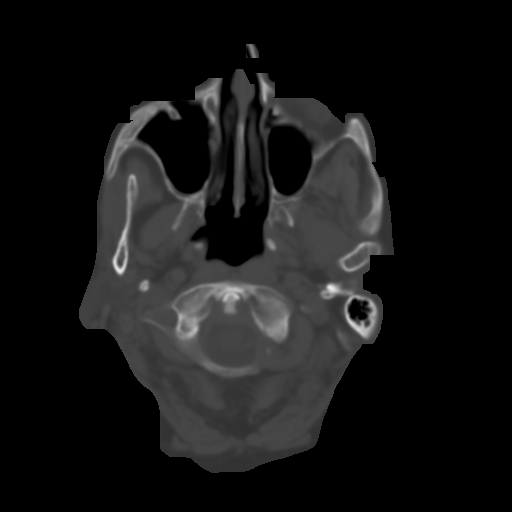
[im 6/34  brain]
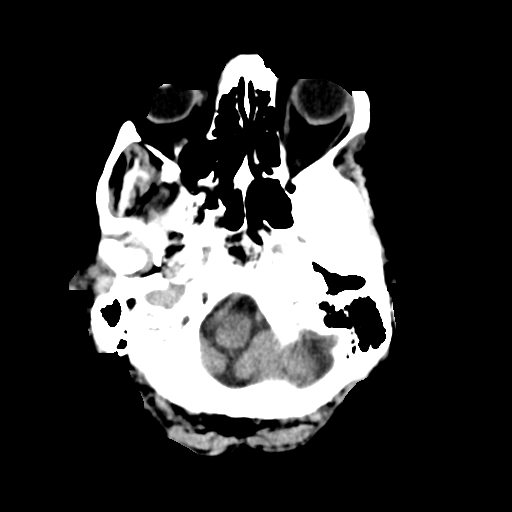
[im 10/34  brain]
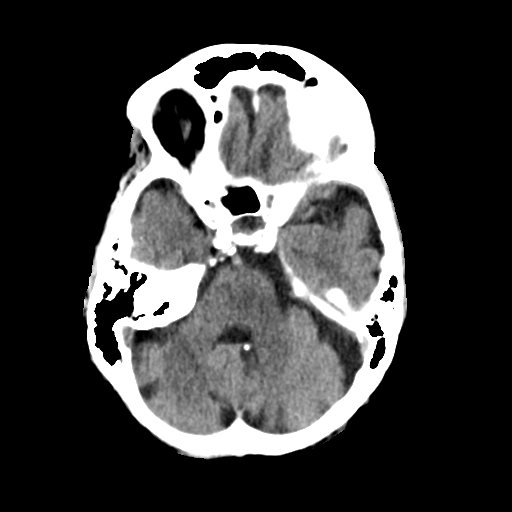
[im 12/34  brain]
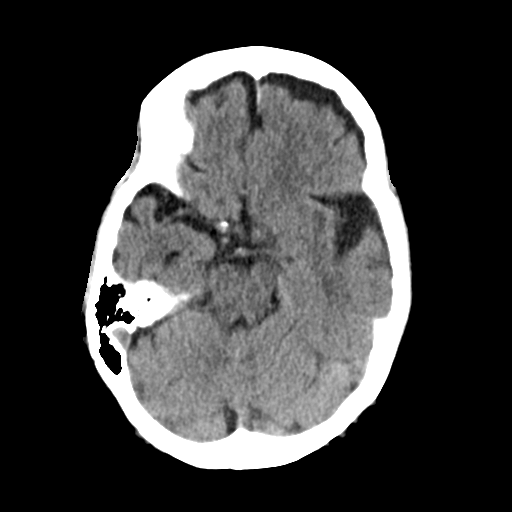
[im 15/34  brain]
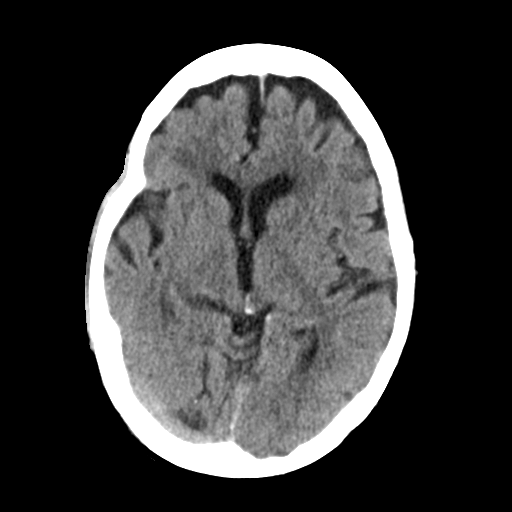
[im 15/34  bone]
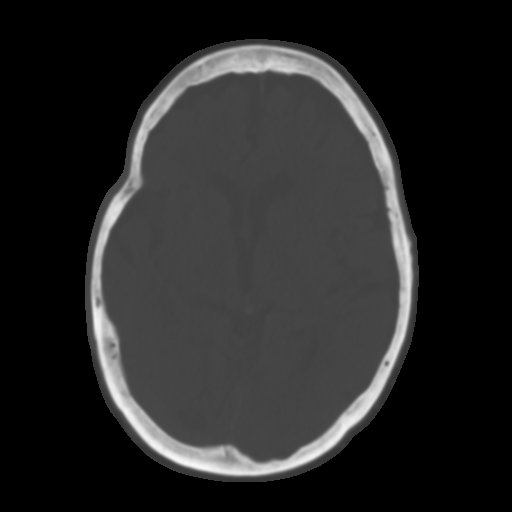
[im 19/34  brain]
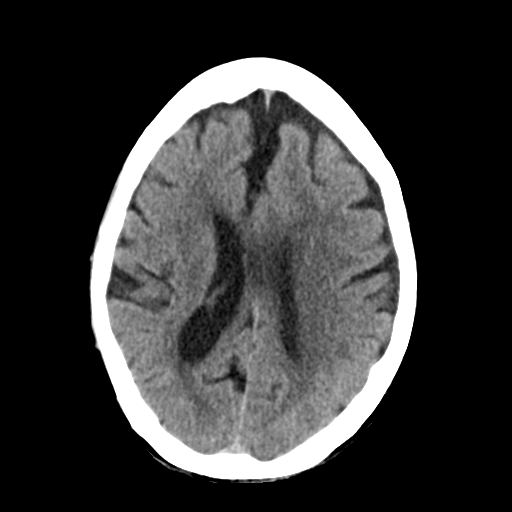
[im 22/34  brain]
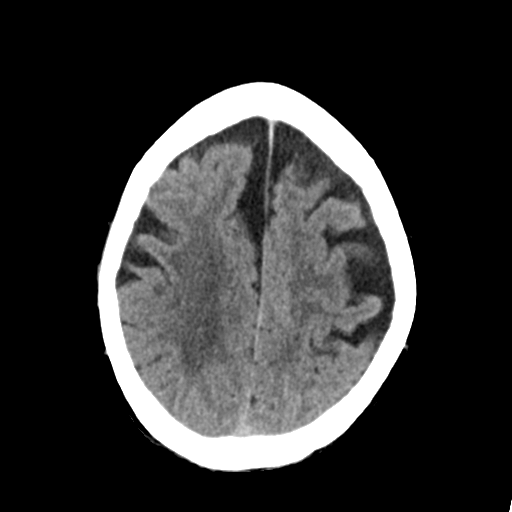
[im 26/34  brain]
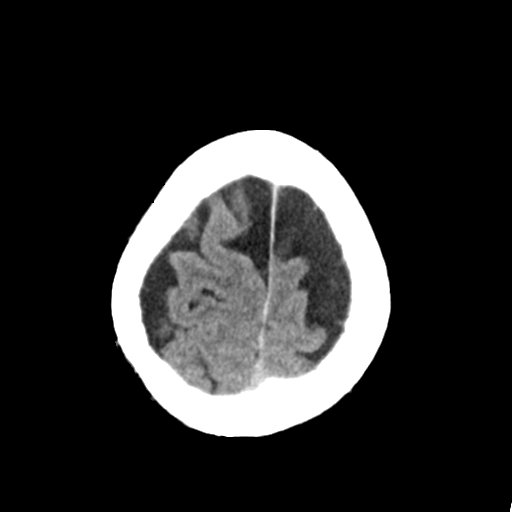
[im 28/34  brain]
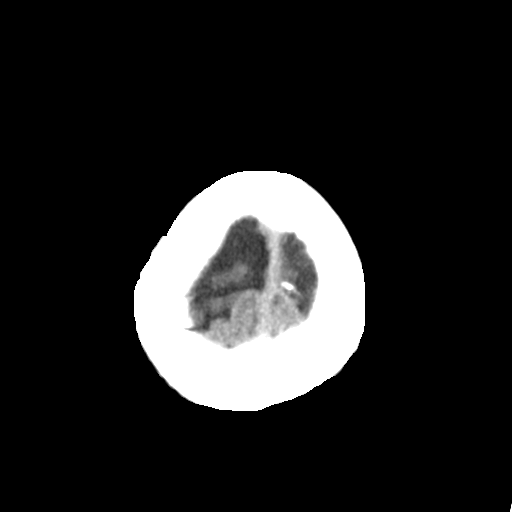
[im 28/34  bone]
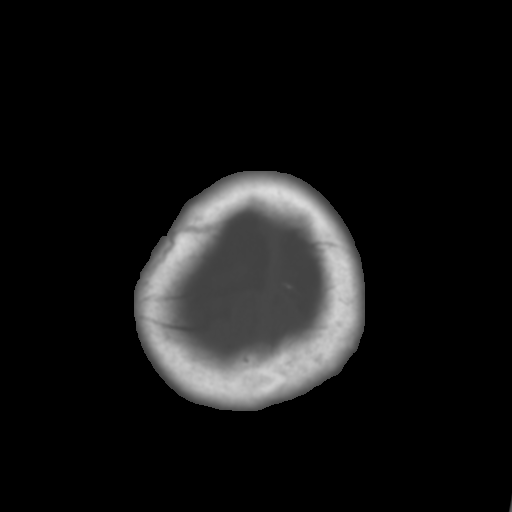
[im 31/34  brain]
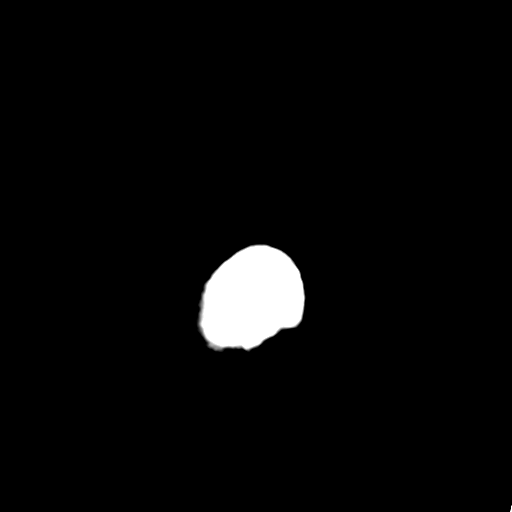

[Series 5: head 3.0 mpr cor · coronal · 0.34mm/px · 3 of 70 slices shown]
[im 24/70  brain]
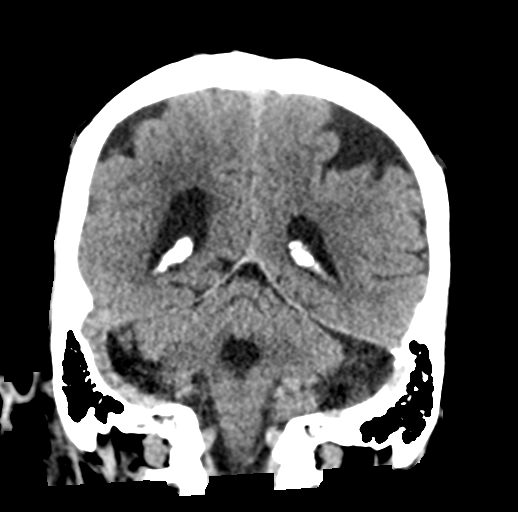
[im 31/70  brain]
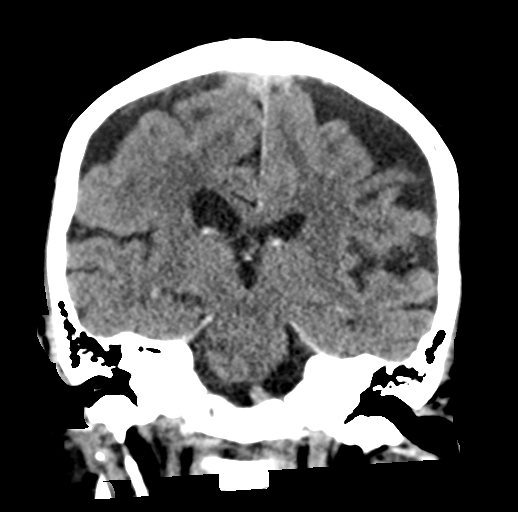
[im 39/70  brain]
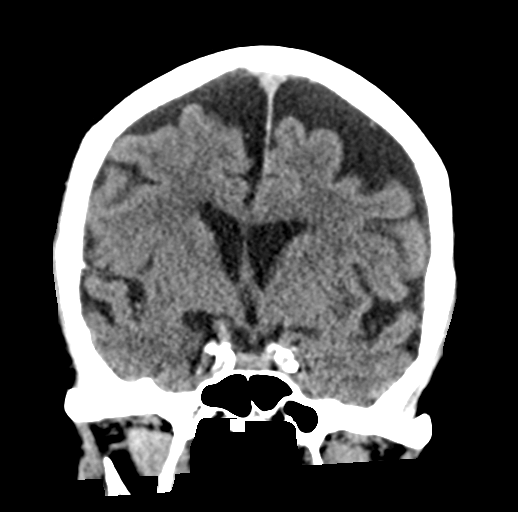

[Series 6: head 3.0 mpr sag · sagittal · 0.34mm/px · 3 of 57 slices shown]
[im 19/57  brain]
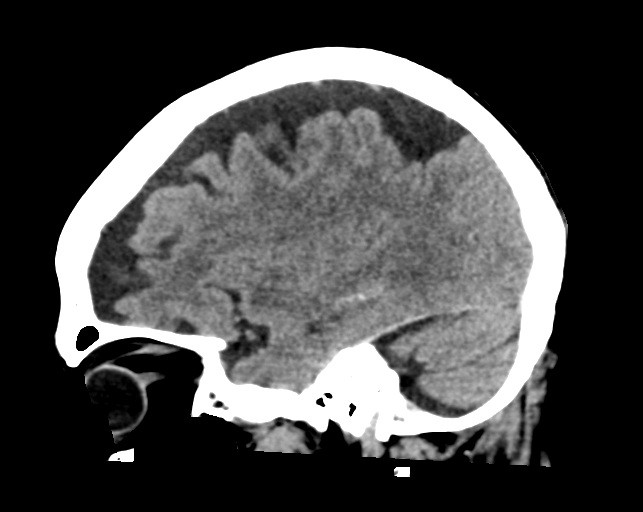
[im 29/57  brain]
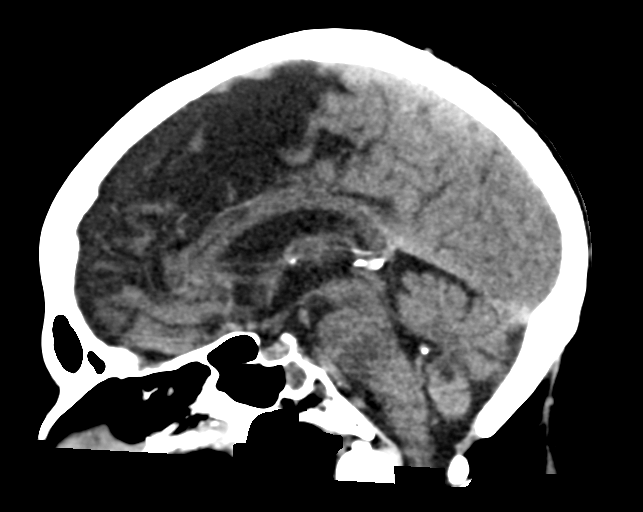
[im 38/57  brain]
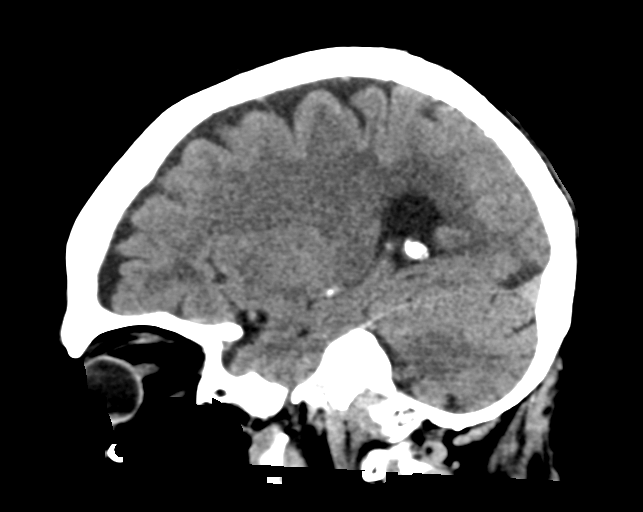

[16 of 47 positions shown; findings below may reference images not displayed]

FINDINGS: Brain: An age indeterminate RIGHT thalamic infarct vs chronic
ischemic changes now noted.

No hemorrhage, midline shift, hydrocephalus, mass lesion or mass
effect.

Atrophy, chronic small-vessel white matter ischemic changes and
remote cerebellar infarcts again noted.

Vascular: Intracranial atherosclerotic calcifications again noted.

Skull: Normal. Negative for fracture or focal lesion.

Sinuses/Orbits: A small mucous retention cyst/polyp within the LEFT
sphenoid sinus noted.

Other: None
IMPRESSION: Age indeterminate RIGHT thalamic infarct vs chronic ischemic
changes.

Atrophy, chronic small-vessel white matter ischemic changes and
remote cerebellar infarcts.

## 2018-10-18 IMAGING — MR MR HEAD W/O CM
10 of 11 series · 42 of 48 positions shown · non-contrast
Comparison: 10/24/2017 CT head. 04/19/2015 MRI and MRA of the head.

CLINICAL DATA: 89 y/o F; patient presenting with word-finding
difficulty. History of stroke, TIA, hypertension, and atrial
fibrillation.

EXAM:
MRI HEAD WITHOUT CONTRAST
TECHNIQUE: Multiplanar, multiecho pulse sequences of the brain and surrounding
structures were obtained without intravenous contrast.

[Series 5: ax dwi_tracew · axial · 3.0mm · 1.50mm/px · z∈[-52,+85]mm · 8 of 80 slices shown]
[im 1/80]
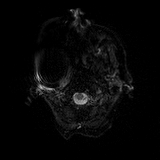
[im 12/80]
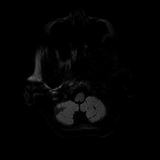
[im 23/80]
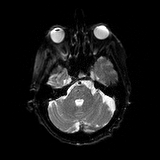
[im 34/80]
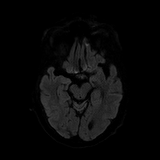
[im 46/80]
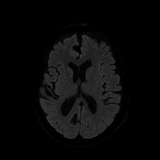
[im 57/80]
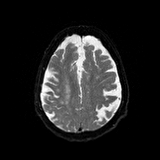
[im 68/80]
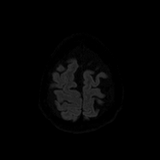
[im 80/80]
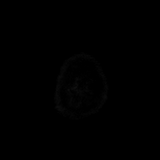

[Series 6: ax dwi_adc · axial · 3.0mm · 1.50mm/px · z∈[-52,+85]mm · 4 of 40 slices shown]
[im 1/40]
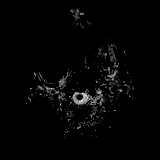
[im 14/40]
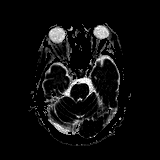
[im 27/40]
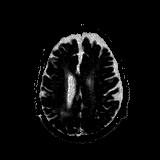
[im 40/40]
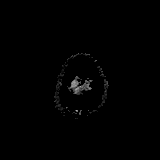

[Series 7: DWI · coronal · 4.0mm · 0.88mm/px · 7 of 68 slices shown (1 of 2)]
[im 1/68]
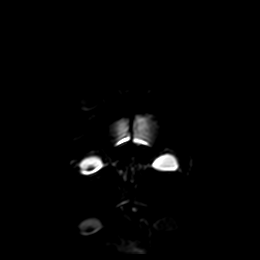
[im 12/68]
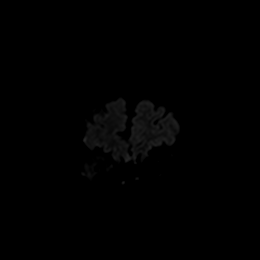
[im 23/68]
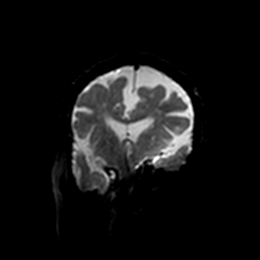
[im 34/68]
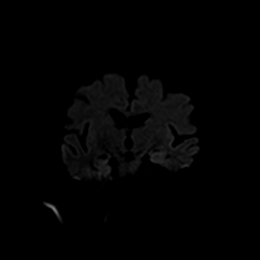
[im 45/68]
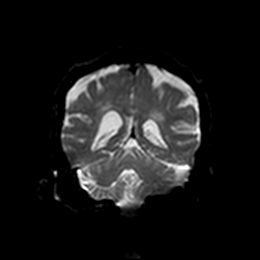
[im 56/68]
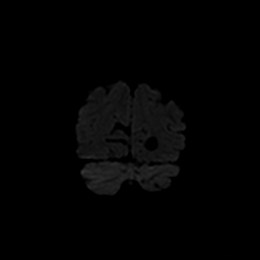
[im 68/68]
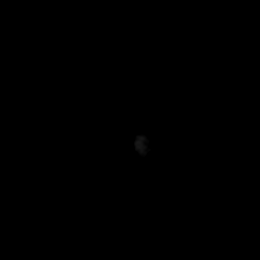

[Series 8: DWI · coronal · 4.0mm · 0.88mm/px · 3 of 34 slices shown (2 of 2)]
[im 1/34]
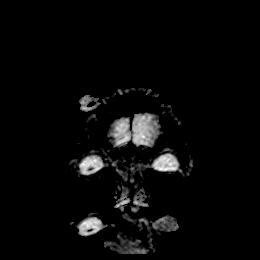
[im 17/34]
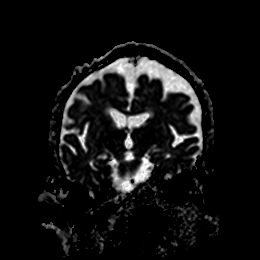
[im 34/34]
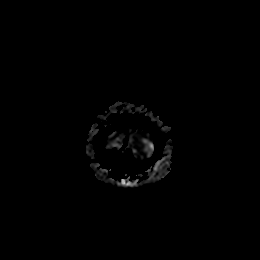

[Series 9: T1 · sagittal · 5.0mm · 0.75mm/px · 2 of 23 slices shown]
[im 1/23]
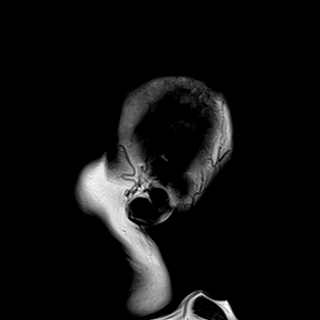
[im 23/23]
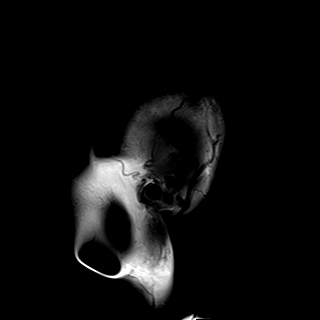

[Series 10: T2 · axial · 5.0mm · 0.69mm/px · z∈[-54,+87]mm · 2 of 25 slices shown (1 of 2)]
[im 1/25]
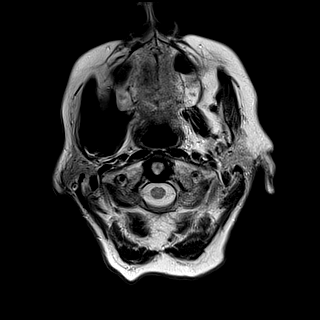
[im 25/25]
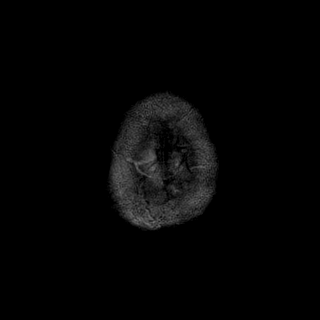

[Series 11: FLAIR · axial · 5.0mm · 0.43mm/px · z∈[-57,+83]mm · 2 of 25 slices shown]
[im 1/25]
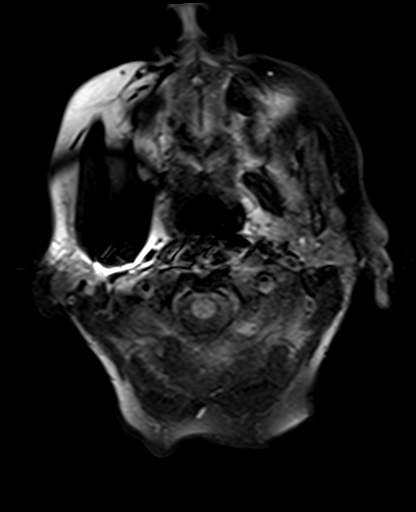
[im 25/25]
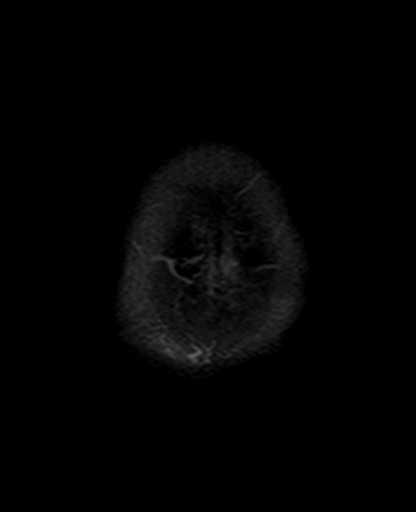

[Series 12: swi_images · axial · 3.0mm · 0.86mm/px · z∈[-65,+107]mm · 6 of 60 slices shown]
[im 1/60]
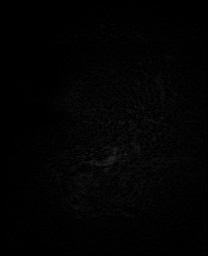
[im 12/60]
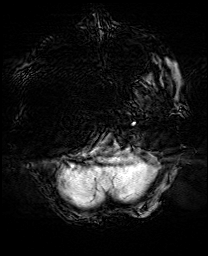
[im 24/60]
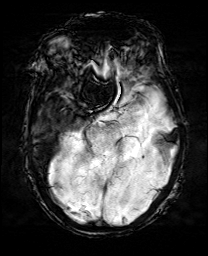
[im 36/60]
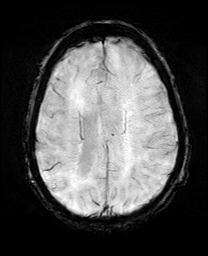
[im 48/60]
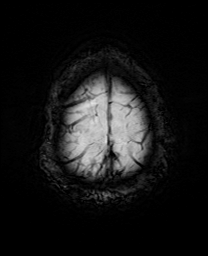
[im 60/60]
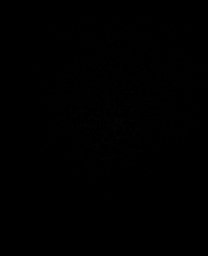

[Series 13: mip_images(sw) · axial · 24.0mm · 0.86mm/px · z∈[-55,+97]mm · 5 of 53 slices shown]
[im 1/53]
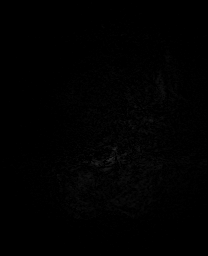
[im 14/53]
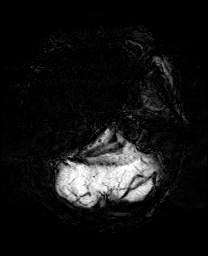
[im 27/53]
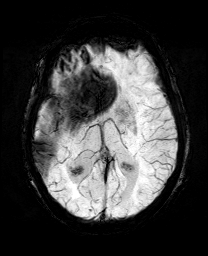
[im 40/53]
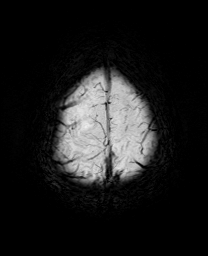
[im 53/53]
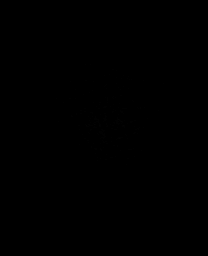

[Series 15: T2 · coronal · 5.0mm · 0.72mm/px · 3 of 28 slices shown (2 of 2)]
[im 1/28]
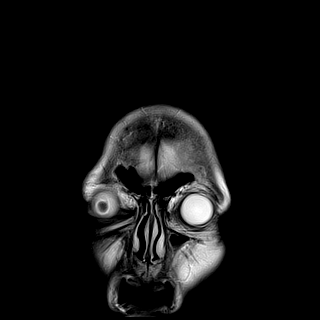
[im 14/28]
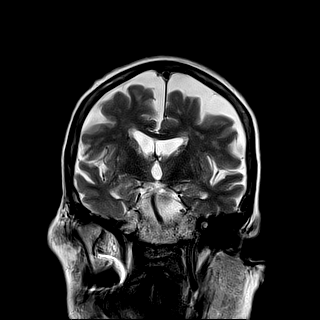
[im 28/28]
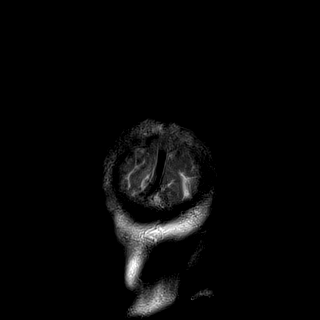

[42 of 48 positions shown; findings below may reference images not displayed]

FINDINGS: Brain: 3 mm focus of reduced diffusion within the right posterior
parietal cortex (series 5, image 68) compatible with acute/early
subacute infarction. No associated hemorrhage or mass effect.

Several small chronic infarctions are present within the cerebellum
bilaterally. Small chronic lacunar infarcts in the right thalamus,
left lentiform nucleus, right caudate head. Small chronic cortical
infarcts of the bilateral occipital lobes 6 and right frontal lobe.
Numerous patchy nonspecific foci of T2 FLAIR hyperintense signal
abnormality in subcortical and periventricular white matter are
compatible with advanced chronic microvascular ischemic changes for
age. Moderate brain parenchymal volume loss. No new susceptibility
hypointensity to indicate interval intracranial hemorrhage.

Vascular: Normal flow voids.

Skull and upper cervical spine: Normal marrow signal.

Sinuses/Orbits: Negative.

Other: Left intra-ocular lens replacement.
IMPRESSION: 1. 3 mm acute/early subacute infarction within the right posterior
parietal cortex. No associated hemorrhage or mass effect.
2. Advanced chronic microvascular ischemic changes and moderate
parenchymal volume loss of the brain.
3. Several small stable chronic infarctions within the cerebellum,
basal ganglia, and right frontal cortex.

These results will be called to the ordering clinician or
representative by the Radiologist Assistant, and communication
documented in the PACS or zVision Dashboard.

By: Rohini Jason M.D.

## 2018-12-24 ENCOUNTER — Encounter (HOSPITAL_COMMUNITY): Payer: Self-pay

## 2018-12-24 ENCOUNTER — Emergency Department (HOSPITAL_COMMUNITY): Payer: Medicare Other

## 2018-12-24 ENCOUNTER — Inpatient Hospital Stay (HOSPITAL_COMMUNITY)
Admission: EM | Admit: 2018-12-24 | Discharge: 2018-12-28 | DRG: 291 | Disposition: A | Discharge status: Home Health | Payer: Medicare Other | Attending: Internal Medicine | Admitting: Internal Medicine

## 2018-12-24 ENCOUNTER — Other Ambulatory Visit: Payer: Self-pay

## 2018-12-24 DIAGNOSIS — R0602 Shortness of breath: Secondary | ICD-10-CM | POA: Diagnosis not present

## 2018-12-24 DIAGNOSIS — Z87891 Personal history of nicotine dependence: Secondary | ICD-10-CM

## 2018-12-24 DIAGNOSIS — Z880 Allergy status to penicillin: Secondary | ICD-10-CM

## 2018-12-24 DIAGNOSIS — F0390 Unspecified dementia without behavioral disturbance: Secondary | ICD-10-CM | POA: Diagnosis present

## 2018-12-24 DIAGNOSIS — Z888 Allergy status to other drugs, medicaments and biological substances status: Secondary | ICD-10-CM | POA: Diagnosis not present

## 2018-12-24 DIAGNOSIS — K219 Gastro-esophageal reflux disease without esophagitis: Secondary | ICD-10-CM | POA: Diagnosis present

## 2018-12-24 DIAGNOSIS — Z209 Contact with and (suspected) exposure to unspecified communicable disease: Secondary | ICD-10-CM | POA: Diagnosis not present

## 2018-12-24 DIAGNOSIS — R0902 Hypoxemia: Secondary | ICD-10-CM | POA: Diagnosis not present

## 2018-12-24 DIAGNOSIS — I5033 Acute on chronic diastolic (congestive) heart failure: Secondary | ICD-10-CM | POA: Diagnosis not present

## 2018-12-24 DIAGNOSIS — G8929 Other chronic pain: Secondary | ICD-10-CM | POA: Diagnosis not present

## 2018-12-24 DIAGNOSIS — D649 Anemia, unspecified: Secondary | ICD-10-CM

## 2018-12-24 DIAGNOSIS — Z7984 Long term (current) use of oral hypoglycemic drugs: Secondary | ICD-10-CM | POA: Diagnosis not present

## 2018-12-24 DIAGNOSIS — Z79899 Other long term (current) drug therapy: Secondary | ICD-10-CM | POA: Diagnosis not present

## 2018-12-24 DIAGNOSIS — R52 Pain, unspecified: Secondary | ICD-10-CM

## 2018-12-24 DIAGNOSIS — J9601 Acute respiratory failure with hypoxia: Secondary | ICD-10-CM | POA: Diagnosis present

## 2018-12-24 DIAGNOSIS — R0600 Dyspnea, unspecified: Secondary | ICD-10-CM | POA: Diagnosis not present

## 2018-12-24 DIAGNOSIS — Z20828 Contact with and (suspected) exposure to other viral communicable diseases: Secondary | ICD-10-CM | POA: Diagnosis present

## 2018-12-24 DIAGNOSIS — M25561 Pain in right knee: Secondary | ICD-10-CM

## 2018-12-24 DIAGNOSIS — F411 Generalized anxiety disorder: Secondary | ICD-10-CM | POA: Diagnosis present

## 2018-12-24 DIAGNOSIS — I10 Essential (primary) hypertension: Secondary | ICD-10-CM | POA: Diagnosis not present

## 2018-12-24 DIAGNOSIS — M1711 Unilateral primary osteoarthritis, right knee: Secondary | ICD-10-CM | POA: Diagnosis present

## 2018-12-24 DIAGNOSIS — Z8673 Personal history of transient ischemic attack (TIA), and cerebral infarction without residual deficits: Secondary | ICD-10-CM

## 2018-12-24 DIAGNOSIS — I959 Hypotension, unspecified: Secondary | ICD-10-CM | POA: Diagnosis not present

## 2018-12-24 DIAGNOSIS — D509 Iron deficiency anemia, unspecified: Secondary | ICD-10-CM | POA: Diagnosis present

## 2018-12-24 DIAGNOSIS — Z823 Family history of stroke: Secondary | ICD-10-CM | POA: Diagnosis not present

## 2018-12-24 DIAGNOSIS — I480 Paroxysmal atrial fibrillation: Secondary | ICD-10-CM | POA: Diagnosis present

## 2018-12-24 DIAGNOSIS — Z8711 Personal history of peptic ulcer disease: Secondary | ICD-10-CM

## 2018-12-24 DIAGNOSIS — Z886 Allergy status to analgesic agent status: Secondary | ICD-10-CM

## 2018-12-24 DIAGNOSIS — I110 Hypertensive heart disease with heart failure: Principal | ICD-10-CM | POA: Diagnosis present

## 2018-12-24 DIAGNOSIS — I350 Nonrheumatic aortic (valve) stenosis: Secondary | ICD-10-CM | POA: Diagnosis not present

## 2018-12-24 DIAGNOSIS — J9811 Atelectasis: Secondary | ICD-10-CM | POA: Diagnosis not present

## 2018-12-24 DIAGNOSIS — M25569 Pain in unspecified knee: Secondary | ICD-10-CM | POA: Diagnosis present

## 2018-12-24 DIAGNOSIS — I361 Nonrheumatic tricuspid (valve) insufficiency: Secondary | ICD-10-CM | POA: Diagnosis not present

## 2018-12-24 LAB — CBC WITH DIFFERENTIAL/PLATELET
Abs Immature Granulocytes: 0.04 10*3/uL (ref 0.00–0.07)
Basophils Absolute: 0 10*3/uL (ref 0.0–0.1)
Basophils Relative: 0 %
Eosinophils Absolute: 0 10*3/uL (ref 0.0–0.5)
Eosinophils Relative: 0 %
HCT: 28.5 % — ABNORMAL LOW (ref 36.0–46.0)
Hemoglobin: 7.8 g/dL — ABNORMAL LOW (ref 12.0–15.0)
Immature Granulocytes: 1 %
Lymphocytes Relative: 12 %
Lymphs Abs: 0.7 10*3/uL (ref 0.7–4.0)
MCH: 24.5 pg — ABNORMAL LOW (ref 26.0–34.0)
MCHC: 27.4 g/dL — ABNORMAL LOW (ref 30.0–36.0)
MCV: 89.6 fL (ref 80.0–100.0)
Monocytes Absolute: 0.5 10*3/uL (ref 0.1–1.0)
Monocytes Relative: 9 %
Neutro Abs: 4.2 10*3/uL (ref 1.7–7.7)
Neutrophils Relative %: 78 %
Platelets: 163 10*3/uL (ref 150–400)
RBC: 3.18 MIL/uL — ABNORMAL LOW (ref 3.87–5.11)
RDW: 16.6 % — ABNORMAL HIGH (ref 11.5–15.5)
WBC: 5.4 10*3/uL (ref 4.0–10.5)
nRBC: 1.3 % — ABNORMAL HIGH (ref 0.0–0.2)

## 2018-12-24 LAB — URINALYSIS, ROUTINE W REFLEX MICROSCOPIC
Bilirubin Urine: NEGATIVE
Glucose, UA: NEGATIVE mg/dL
Hgb urine dipstick: NEGATIVE
Ketones, ur: NEGATIVE mg/dL
Nitrite: POSITIVE — AB
Protein, ur: 30 mg/dL — AB
Specific Gravity, Urine: 1.027 (ref 1.005–1.030)
pH: 5 (ref 5.0–8.0)

## 2018-12-24 LAB — COMPREHENSIVE METABOLIC PANEL
ALT: 38 U/L (ref 0–44)
AST: 24 U/L (ref 15–41)
Albumin: 3.5 g/dL (ref 3.5–5.0)
Alkaline Phosphatase: 88 U/L (ref 38–126)
Anion gap: 9 (ref 5–15)
BUN: 26 mg/dL — ABNORMAL HIGH (ref 8–23)
CO2: 27 mmol/L (ref 22–32)
Calcium: 9.5 mg/dL (ref 8.9–10.3)
Chloride: 110 mmol/L (ref 98–111)
Creatinine, Ser: 0.94 mg/dL (ref 0.44–1.00)
GFR calc Af Amer: >60 mL/min (ref >60)
GFR calc non Af Amer: 54 mL/min — ABNORMAL LOW (ref >60)
Glucose, Bld: 119 mg/dL — ABNORMAL HIGH (ref 70–99)
Potassium: 3.8 mmol/L (ref 3.5–5.1)
Sodium: 146 mmol/L — ABNORMAL HIGH (ref 135–145)
Total Bilirubin: 0.6 mg/dL (ref 0.3–1.2)
Total Protein: 6.4 g/dL — ABNORMAL LOW (ref 6.5–8.1)

## 2018-12-24 LAB — RETICULOCYTES
Immature Retic Fract: 36.9 % — ABNORMAL HIGH (ref 2.3–15.9)
RBC.: 3.22 MIL/uL — ABNORMAL LOW (ref 3.87–5.11)
Retic Count, Absolute: 71.8 10*3/uL (ref 19.0–186.0)
Retic Ct Pct: 2.2 % (ref 0.4–3.1)

## 2018-12-24 LAB — BRAIN NATRIURETIC PEPTIDE: B Natriuretic Peptide: 1282.8 pg/mL — ABNORMAL HIGH (ref 0.0–100.0)

## 2018-12-24 LAB — TROPONIN I (HIGH SENSITIVITY)
Troponin I (High Sensitivity): 44 ng/L — ABNORMAL HIGH (ref <18)
Troponin I (High Sensitivity): 45 ng/L — ABNORMAL HIGH (ref <18)

## 2018-12-24 LAB — IRON AND TIBC
Iron: 16 ug/dL — ABNORMAL LOW (ref 28–170)
Saturation Ratios: 3 % — ABNORMAL LOW (ref 10.4–31.8)
TIBC: 459 ug/dL — ABNORMAL HIGH (ref 250–450)
UIBC: 443 ug/dL

## 2018-12-24 LAB — LACTIC ACID, PLASMA
Lactic Acid, Venous: 0.9 mmol/L (ref 0.5–1.9)
Lactic Acid, Venous: 0.8 mmol/L (ref 0.5–1.9)

## 2018-12-24 LAB — SARS CORONAVIRUS 2 BY RT PCR (HOSPITAL ORDER, PERFORMED IN ~~LOC~~ HOSPITAL LAB): SARS Coronavirus 2: NEGATIVE

## 2018-12-24 LAB — FERRITIN: Ferritin: 8 ng/mL — ABNORMAL LOW (ref 11–307)

## 2018-12-24 LAB — TYPE AND SCREEN
ABO/RH(D): O POS
Antibody Screen: NEGATIVE

## 2018-12-24 LAB — ABO/RH: ABO/RH(D): O POS

## 2018-12-24 LAB — VITAMIN B12: Vitamin B-12: 1114 pg/mL — ABNORMAL HIGH (ref 180–914)

## 2018-12-24 LAB — FOLATE: Folate: 40.6 ng/mL (ref >5.9)

## 2018-12-24 MED ORDER — POLYETHYLENE GLYCOL 3350 17 G PO PACK
17.0000 g | PACK | Freq: Every day | ORAL | Status: DC | PRN
  Administered 2018-12-25: 17 g via ORAL
  Filled 2018-12-24: qty 1

## 2018-12-24 MED ORDER — SODIUM CHLORIDE 0.9% FLUSH
3.0000 mL | Freq: Two times a day (BID) | INTRAVENOUS | Status: DC
  Administered 2018-12-24 – 2018-12-28 (×8): 3 mL via INTRAVENOUS

## 2018-12-24 MED ORDER — FLEET ENEMA 7-19 GM/118ML RE ENEM: 1.0000 | ENEMA | Freq: Every day | RECTAL | Status: DC | PRN

## 2018-12-24 MED ORDER — IOHEXOL 350 MG/ML SOLN
100.0000 mL | Freq: Once | INTRAVENOUS | Status: AC | PRN
  Administered 2018-12-24: 100 mL via INTRAVENOUS

## 2018-12-24 MED ORDER — BISACODYL 10 MG RE SUPP
10.0000 mg | Freq: Every day | RECTAL | Status: DC | PRN
  Administered 2018-12-26: 10 mg via RECTAL
  Filled 2018-12-24: qty 1

## 2018-12-24 MED ORDER — ACETAMINOPHEN 650 MG RE SUPP: 650.0000 mg | Freq: Four times a day (QID) | RECTAL | Status: DC | PRN

## 2018-12-24 MED ORDER — SODIUM CHLORIDE (PF) 0.9 % IJ SOLN
INTRAMUSCULAR | Status: AC
  Filled 2018-12-24: qty 50

## 2018-12-24 MED ORDER — DILTIAZEM HCL 90 MG PO TABS
180.0000 mg | ORAL_TABLET | Freq: Two times a day (BID) | ORAL | Status: DC
  Administered 2018-12-24 – 2018-12-27 (×6): 180 mg via ORAL
  Filled 2018-12-24 (×6): qty 2

## 2018-12-24 MED ORDER — SENNA 8.6 MG PO TABS
1.0000 | ORAL_TABLET | Freq: Two times a day (BID) | ORAL | Status: DC
  Administered 2018-12-24 – 2018-12-28 (×8): 8.6 mg via ORAL
  Filled 2018-12-24 (×8): qty 1

## 2018-12-24 MED ORDER — ACETAMINOPHEN 325 MG PO TABS
650.0000 mg | ORAL_TABLET | Freq: Four times a day (QID) | ORAL | Status: DC | PRN
  Administered 2018-12-25 – 2018-12-28 (×5): 650 mg via ORAL
  Filled 2018-12-24 (×5): qty 2

## 2018-12-24 MED ORDER — SODIUM CHLORIDE 0.9% FLUSH: 3.0000 mL | INTRAVENOUS | Status: DC | PRN

## 2018-12-24 MED ORDER — ONDANSETRON HCL 4 MG PO TABS: 4.0000 mg | ORAL_TABLET | Freq: Four times a day (QID) | ORAL | Status: DC | PRN

## 2018-12-24 MED ORDER — POLYVINYL ALCOHOL 1.4 % OP SOLN
1.0000 [drp] | Freq: Four times a day (QID) | OPHTHALMIC | Status: DC | PRN
  Filled 2018-12-24: qty 15

## 2018-12-24 MED ORDER — ONDANSETRON HCL 4 MG/2ML IJ SOLN: 4.0000 mg | Freq: Four times a day (QID) | INTRAMUSCULAR | Status: DC | PRN

## 2018-12-24 MED ORDER — ALPRAZOLAM 0.25 MG PO TABS
0.1250 mg | ORAL_TABLET | Freq: Two times a day (BID) | ORAL | Status: DC | PRN
  Administered 2018-12-27: 0.125 mg via ORAL
  Filled 2018-12-24: qty 1

## 2018-12-24 MED ORDER — METOPROLOL TARTRATE 25 MG PO TABS
25.0000 mg | ORAL_TABLET | Freq: Two times a day (BID) | ORAL | Status: DC
  Administered 2018-12-24 – 2018-12-28 (×8): 25 mg via ORAL
  Filled 2018-12-24 (×8): qty 1

## 2018-12-24 MED ORDER — FUROSEMIDE 10 MG/ML IJ SOLN
20.0000 mg | Freq: Two times a day (BID) | INTRAMUSCULAR | Status: DC
  Administered 2018-12-24 – 2018-12-28 (×8): 20 mg via INTRAVENOUS
  Filled 2018-12-24 (×8): qty 2

## 2018-12-24 MED ORDER — PANTOPRAZOLE SODIUM 40 MG PO TBEC
40.0000 mg | DELAYED_RELEASE_TABLET | Freq: Every day | ORAL | Status: DC
  Administered 2018-12-25 – 2018-12-28 (×4): 40 mg via ORAL
  Filled 2018-12-24 (×4): qty 1

## 2018-12-24 MED ORDER — SODIUM CHLORIDE 0.9 % IV SOLN: 250.0000 mL | INTRAVENOUS | Status: DC | PRN

## 2018-12-24 NOTE — H&P (Signed)
[facility] History and Physical  Aideliz Mcphetridge [id number] DOB: [birth year] DOA: 12/24/2018  Referring physician: Dr Francia Greaves PCP: Lajean Manes, MD   Chief Complaint: SOB   HPI: Jacqueline [name] is a 85 y.o. female with hx of  HTN, CVA's, OA, PAF , chron diast HF who presented to ED w/ SOB. EMS found sats in high 70's improved to 96% on 4L [place]. Also she has chronic R knee pain.  In ED CXR was negative w/ some atx, BNP was high at 1200 and CTA chest was negative for PE. Hb is 7.8 (usual 14, lowest prior 8.6).   Asked to see for admission.    Took O2 off in ED and sat's dropped to low 80's after 3- 4 minutes.  Patient denies prod cough, fever, chills, CP, abd pain, n/v/d.  Has chronic R knee pain. Vague historian. Hx of diast HF.  Main SOB c/o is DOE.    Patient states has episodic rectal bleeding every once in a while and her doctors "build it back up". Says it has been happening for "years". Has not had any recent rectal bleeding, black stools , nosebleeds or other bleeding.   Admit Nov 2015 - R arm fracture, essent HTN, CVA, OA, PAF, chron diast HF, L humerus fx after fall Admit Feb 2016 - admit for acute R MCA stroke, afib w/ RVR, HTN, and HL; not TPA candidate due to xarelto and minimal deficits and time since onset Admit June 2019 - speech difficulties, acute CVA, speech chgs resolved. Acute infarction in R post parietal cortex. Per neuro cont Eliquis. EF 55% Admit Jul 2019 - acute encephalopathy, hx dementia. No infection, head CT negatve. MRI poor study. B12 , TSH, utox, NH3 negative.  Cont prn xanax     ROS  denies CP  no joint pain   no HA  no blurry vision  no rash  no diarrhea  no nausea/ vomiting  no dysuria  no difficulty voiding  no change in urine color    Past Medical History  Past Medical History:  Diagnosis Date  . Allergy, unspecified not elsewhere classified   . Anemia, unspecified   . Anxiety state, unspecified   . Atrial fibrillation with rapid ventricular  response ([place]) 03/13/12  . Blood in stool   . Complication of anesthesia    "hard to wake me up after colonoscopy" (03/13/12)  . Degeneration of lumbar or lumbosacral intervertebral disc   . GERD (gastroesophageal reflux disease)   . H/O hiatal hernia   . Hemorrhage of gastrointestinal tract, unspecified   . History of IBS   . Osteoarthrosis, unspecified whether generalized or localized, lower leg   . Other diseases of lung, not elsewhere classified   . Other dyspnea and respiratory abnormality   . Personal history of other diseases of digestive system   . Personal history of peptic ulcer disease   . Unspecified adverse effect of other drug, medicinal and biological substance(995.29)   . Unspecified cerebral artery occlusion with cerebral infarction 10/2008   "mini stroke" denies residual (03/13/12)  . Unspecified essential hypertension   . Unspecified paranoid state    Past Surgical History  Past Surgical History:  Procedure Laterality Date  . DILATION AND CURETTAGE OF UTERUS  1950's?  . ORIF HUMERUS FRACTURE Right 04/03/2014   Procedure: OPEN REDUCTION INTERNAL FIXATION (ORIF) RIGHT HUMERUS DISTAL FRACTURE;  Surgeon: Roseanne Kaufman, MD;  Location: [facility];  Service: Orthopedics;  Laterality: Right;  . PILONIDAL CYST / SINUS EXCISION  [place] ARTHROSCOPY  ~ 2000   right   Family History  Family History  Problem Relation Age of Onset  . Diabetes Mother   . CVA Mother   . CVA Father   . CVA Maternal Grandmother    Social History  reports that she quit smoking about 16 years ago. Her smoking use included cigarettes. She has a 40.00 pack-year smoking history. She has never used smokeless tobacco. She reports that she does not drink alcohol or use drugs. Allergies  Allergies  Allergen Reactions  . Metoclopramide Hcl Other (See Comments)     Tremors  . Penicillins Itching    Has patient had a PCN reaction causing immediate rash, facial/tongue/throat swelling, SOB or  lightheadedness with hypotension: Yes Has patient had a PCN reaction causing severe rash involving mucus membranes or skin necrosis: Yes Has patient had a PCN reaction that required hospitalization:No Has patient had a PCN reaction occurring within the last 10 years: No If all of the above answers are "NO", then may proceed with Cephalosporin use.   . Aspirin Other (See Comments)    "gave me stomach aches; made me bleed" (03/13/12)  . Zetia [Ezetimibe] Other (See Comments)    Stomach and back pain   Home medications Prior to Admission medications   Medication Sig Start Date End Date Taking? Authorizing Provider  ALPRAZolam (XANAX) 0.25 MG tablet Take 0.5 tablets (0.125 mg total) by mouth 2 (two) times daily as needed for anxiety. 10/28/17  Yes Domenic Polite, MD  apixaban (ELIQUIS) 5 MG TABS tablet Take 1 tablet (5 mg total) by mouth 2 (two) times daily. 06/20/14  Yes Regalado, Belkys A, MD  bisacodyl (DULCOLAX) 10 MG suppository Place 10 mg rectally daily as needed (constipation not relieved by MOM).   Yes [provider]  Cholecalciferol (VITAMIN D3) 5000 UNITS CAPS Take 5,000 Units by mouth daily.   Yes [provider]  diltiazem (CARDIZEM) 60 MG tablet Take 1 tablet (60 mg total) by mouth every 8 (eight) hours. Patient taking differently: Take 180 mg by mouth 2 (two) times daily.  11/06/17 12/24/18 Yes Elodia Florence., MD  hypromellose (SYSTANE OVERNIGHT THERAPY) 0.3 % GEL ophthalmic ointment Place 1 application into both eyes 4 (four) times daily as needed for dry eyes.   Yes [provider]  Magnesium Hydroxide (MILK OF MAGNESIA PO) Take 30 mLs by mouth daily as needed (if no BM in 3 days).   Yes [provider]  metoprolol tartrate (LOPRESSOR) 25 MG tablet Take 1 tablet (25 mg total) by mouth 2 (two) times daily. 11/06/17 12/24/18 Yes Elodia Florence., MD  Multiple Vitamin (MULTIVITAMIN WITH MINERALS) TABS tablet Take 1 tablet by mouth daily.  Centrum Silver   Yes [provider]  pantoprazole (PROTONIX) 40 MG tablet TAKE 1 TABLET EVERY MORNING Patient taking differently: Take 40 mg by mouth daily.    Yes Hendricks Limes, MD  senna (SENOKOT) 8.6 MG TABS tablet Take 1 tablet by mouth 2 (two) times daily.   Yes [provider]  Sodium Phosphates (FLEET ENEMA RE) Place 1 enema rectally daily as needed (constipation not relieved by bisacodyl suppository).   Yes [provider]  donepezil (ARICEPT) 5 MG tablet Take 1 tablet (5 mg total) by mouth at bedtime. Patient not taking: Reported on 12/24/2018 11/06/17 12/06/17  Elodia Florence., MD   Liver Function Tests Recent Labs  Lab 12/24/18 1330  AST 24  ALT 38  ALKPHOS 88  BILITOT 0.6  PROT 6.4*  ALBUMIN 3.5   No results for input(s): LIPASE, AMYLASE in the last 168 hours. CBC Recent Labs  Lab 12/24/18 1330  WBC 5.4  NEUTROABS 4.2  HGB 7.8*  HCT 28.5*  MCV 89.6  PLT [id number]   Basic Metabolic Panel Recent Labs  Lab 12/24/18 1330  NA 146*  K 3.8  CL 110  CO2 27  GLUCOSE 119*  BUN 26*  CREATININE 0.94  CALCIUM 9.5     Vitals:   12/24/18 1600 12/24/18 1630 12/24/18 1700 12/24/18 1730  BP: (!) 162/82 (!) 156/97 (!) 165/76 (!) 157/72  Pulse: 79 66 68 70  Resp: (!) 22 (!) 29 (!) 25 (!) 22  Temp:      TempSrc:      SpO2: 99% 100% 99% 92%   Exam: Gen alert, no distress  No rash, cyanosis or gangrene Sclera anicteric, throat clear No jvd or bruits Chest no rales or wheezing, good air movement RRR no MRG Abd soft ntnd no mass or ascites +bs GU defer MS no joint effusions or deformity, R knee w/o warmth/ effusion Ext trace bilat pretib edema / no wounds or ulcers Neuro is alert, Ox 3 , nf     Home meds:  - alprazolam 0.125 bid prn/ donepezil 5 hs  - pantoprazole 40 qd  - apixaban 5 bid  - diltiazem 180 bid/ metoprolol 25 bid  - prn sodium phosphate enemas/ MVI/ MOM prn/ vit D3    Ba 146   K 3.8  CO 27  BUN 26  Creat 0.94    Alb 3.5  AST 24/ ALT 38  BNP 1282  Trop HS = 44 > 45     WBC 5k  Hb 7.8  plt 163   UA hazy, +nitrite, 30 protein, pH 5.0  Few bact, 21-50 wbc, 0-5 rbc  EKG (independ reviewed) > NSR no acute, 76 bpm CXR (independ reviewed) > IMPRESSION: Minimal bibasilar subsegmental atelectasis.   Assessment: 1. Dyspnea/ hypoxemic acute resp failure/ new anemia -  ^BNP and some ground glass on CT suggesting CHF exacerbation. Also quite anemic. Asymptomatic except for DOE. No signs of PNA and CTA neg for PE, no hx COPD, nonsmoker, no COPD on CT.  May have some degree of OHS.  Plan is IV Lasix  20 mg bid , get echo in am, follow O2 sats, O2 support. 2. Anemia - Hb 7.8,  Normocytic. Not sure cause, patient states has episodic rectal bleeding every once in a while, not recently, poor historian, and her doctors "build it back up". Says it has been happening for "years". Last Hb was 15 in July 2019. Plan > anemia panel, stool guiac, type and screen, transfuse prn. Hold eliquis for now.  3. Afib - hold eliquis, cont dilt and metoprolol. NSR on EKG, HR's wnl.  4. HTN - bp's stable 140 /80 range, cont BB/ dilt 5. Dementia - mild, cont donepezil 6. Constipation - not active, prn's as needed 7. Chronic R knee pain - affects walking, gets cortisone shots every so often. Nothing on exam. Ortho as needed 8. Hx CVA's     Plan - as above   DVT prophylaxis: SCD's until stool guiac checked Family communication: none here Code status: full  Admit status: INpatient Bed type: Telemetry    Kelly Splinter MD  [facility]  pgr [phone] 12/24/2018, 6:09 PM  If 7PM-7AM, please contact night-coverage [email or web address] Password [facility] 12/24/2018, 6:09 PM

## 2018-12-24 NOTE — ED Provider Notes (Signed)
[facility] DEPT Provider Note   CSN: [id number] Arrival date & time: 12/24/18  1249     History   Chief Complaint Chief Complaint  Patient presents with  . Shortness of Breath  . Knee Pain    HPI Jacqueline [name] is a 85 y.o. female.     85 year old female with medical history as detailed below presents for evaluation of shortness of breath.  Patient's son contacted EMS for transport.  Patient reportedly with increased shortness of breath over the last 24 hours.  EMS reports room air sats were in the high 70s.  Patient denies associated fever, cough, chest pain.  She is complaining of chronic right-sided knee discomfort.    The history is provided by the patient, medical records and the EMS personnel.  Shortness of Breath Severity:  Moderate Onset quality:  Gradual Duration:  1 day Timing:  Constant Progression:  Worsening Chronicity:  New Relieved by:  Nothing Worsened by:  Nothing Ineffective treatments:  None tried Associated symptoms: no fever   Knee Pain Associated symptoms: no fever     Past Medical History:  Diagnosis Date  . Allergy, unspecified not elsewhere classified   . Anemia, unspecified   . Anxiety state, unspecified   . Atrial fibrillation with rapid ventricular response ([place]) 03/13/12  . Blood in stool   . Complication of anesthesia    "hard to wake me up after colonoscopy" (03/13/12)  . Degeneration of lumbar or lumbosacral intervertebral disc   . GERD (gastroesophageal reflux disease)   . H/O hiatal hernia   . Hemorrhage of gastrointestinal tract, unspecified   . History of IBS   . Osteoarthrosis, unspecified whether generalized or localized, lower leg   . Other diseases of lung, not elsewhere classified   . Other dyspnea and respiratory abnormality   . Personal history of other diseases of digestive system   . Personal history of peptic ulcer disease   . Unspecified adverse effect of other drug, medicinal and  biological substance(995.29)   . Unspecified cerebral artery occlusion with cerebral infarction 10/2008   "mini stroke" denies residual (03/13/12)  . Unspecified essential hypertension   . Unspecified paranoid state     Patient Active Problem List   Diagnosis Date Noted  . GERD (gastroesophageal reflux disease) 11/04/2017  . Altered mental status 11/03/2017  . CVA (cerebral vascular accident) ([place]) 10/25/2017  . Benign essential HTN   . History of TIA (transient ischemic attack)   . TIA (transient ischemic attack) 10/24/2017  . Cerebral infarction due to embolism of right middle cerebral artery ([place])   . HLD (hyperlipidemia)   . Acute ischemic right MCA stroke ([place]) 06/18/2014  . Facial droop   . Displaced fracture of shaft of left humerus 04/03/2014  . Right arm fracture 04/01/2014  . Displaced spiral fracture of shaft of humerus   . Peripheral arterial disease ([place]) 10/03/2013  . Chronic diastolic CHF (congestive heart failure) ([place]) 08/08/2013  . Chronic venous insufficiency 04/02/2013  . Hypokalemia 01/13/2013  . PAF (paroxysmal atrial fibrillation) ([place]) 10/03/2012  . CAD (coronary artery disease) of artery bypass graft 04/05/2012  . Atrial fibrillation with RVR ([place]) 03/13/2012  . Need for prophylactic vaccination and inoculation against influenza 02/14/2012  . ABDOMINAL WALL HERNIA 01/21/2010  . PARESTHESIA 05/04/2009  . SKIN LESION 01/28/2009  . Nocturia 01/28/2009  . DYSPNEA 01/01/2009  . CVA (cerebral infarction) 11/17/2008  . ALLERGY 10/27/2008  . PAROXYSMAL NOCTURNAL DYSPNEA 10/03/2008  . Osteoarthrosis, unspecified whether generalized or  localized, involving lower leg 09/03/2008  . RESTRICTIVE LUNG DISEASE 05/26/2008  . Anemia 01/08/2008  . UNS ADVRS EFF OTH RX MEDICINAL&BIOLOGICAL SBSTNC 11/09/2007  . HEMATOCHEZIA 07/18/2007  . PARANOIA 06/01/2007  . Anxiety 01/23/2007  . Hypertensive heart disease with CHF ([place]) 01/23/2007  . GI BLEEDING 01/23/2007  .  Osteoarthritis 01/23/2007  . DEGENERATIVE DISC DISEASE, LUMBAR SPINE 01/23/2007  . History of peptic ulcer disease 01/23/2007  . Personal history of other diseases of digestive system 01/23/2007    Past Surgical History:  Procedure Laterality Date  . DILATION AND CURETTAGE OF UTERUS  1950's?  . ORIF HUMERUS FRACTURE Right 04/03/2014   Procedure: OPEN REDUCTION INTERNAL FIXATION (ORIF) RIGHT HUMERUS DISTAL FRACTURE;  Surgeon: Roseanne Kaufman, MD;  Location: [facility];  Service: Orthopedics;  Laterality: Right;  . PILONIDAL CYST / SINUS EXCISION  1957  . SHOULDER ARTHROSCOPY  ~ 2000   right     OB History   No obstetric history on file.      Home Medications    Prior to Admission medications   Medication Sig Start Date End Date Taking? Authorizing Provider  ALPRAZolam (XANAX) 0.25 MG tablet Take 0.5 tablets (0.125 mg total) by mouth 2 (two) times daily as needed for anxiety. 10/28/17   Domenic Polite, MD  apixaban (ELIQUIS) 5 MG TABS tablet Take 1 tablet (5 mg total) by mouth 2 (two) times daily. 06/20/14   Regalado, Belkys A, MD  bisacodyl (DULCOLAX) 10 MG suppository Place 10 mg rectally daily as needed (constipation not relieved by MOM).    [provider]  Cholecalciferol (VITAMIN D3) 5000 UNITS CAPS Take 5,000 Units by mouth daily.    [provider]  diltiazem (CARDIZEM) 60 MG tablet Take 1 tablet (60 mg total) by mouth every 8 (eight) hours. 11/06/17 12/06/17  Elodia Florence., MD  donepezil (ARICEPT) 5 MG tablet Take 1 tablet (5 mg total) by mouth at bedtime. 11/06/17 12/06/17  Elodia Florence., MD  hypromellose (SYSTANE OVERNIGHT THERAPY) 0.3 % GEL ophthalmic ointment Place 1 application into both eyes 4 (four) times daily as needed for dry eyes.    [provider]  Magnesium Hydroxide (MILK OF MAGNESIA PO) Take 30 mLs by mouth daily as needed (if no BM in 3 days).    [provider]  metoprolol tartrate (LOPRESSOR) 25 MG tablet Take 1 tablet  (25 mg total) by mouth 2 (two) times daily. 11/06/17 12/06/17  Elodia Florence., MD  Multiple Vitamin (MULTIVITAMIN WITH MINERALS) TABS tablet Take 1 tablet by mouth daily. Centrum Silver    [provider]  pantoprazole (PROTONIX) 40 MG tablet TAKE 1 TABLET EVERY MORNING Patient taking differently: Take 40 mg by mouth daily.     Hendricks Limes, MD  senna (SENOKOT) 8.6 MG TABS tablet Take 1 tablet by mouth 2 (two) times daily.    [provider]  Sodium Phosphates (FLEET ENEMA RE) Place 1 enema rectally daily as needed (constipation not relieved by bisacodyl suppository).    [provider]    Family History Family History  Problem Relation Age of Onset  . Diabetes Mother   . CVA Mother   . CVA Father   . CVA Maternal Grandmother     Social History Social History   Tobacco Use  . Smoking status: Former Smoker    Packs/day: 1.00    Years: 40.00    Pack years: 40.00    Types: Cigarettes    Quit date:  05/02/2002    Years since quitting: 16.6  . Smokeless tobacco: Never Used  Substance Use Topics  . Alcohol use: No  . Drug use: No     Allergies   Metoclopramide hcl, Penicillins, Aspirin, and Zetia [ezetimibe]   Review of Systems Review of Systems  Constitutional: Negative for fever.  Respiratory: Positive for shortness of breath.   All other systems reviewed and are negative.    Physical Exam Updated Vital Signs BP (!) 162/80   Pulse 81   Temp 98.4 F (36.9 C) (Oral)   Resp 19   SpO2 98%   Physical Exam Vitals signs and nursing note reviewed.  Constitutional:      General: She is not in acute distress.    Appearance: She is well-developed.  HENT:     Head: Normocephalic and atraumatic.  Eyes:     Conjunctiva/sclera: Conjunctivae normal.     Pupils: Pupils are equal, round, and reactive to light.  Neck:     Musculoskeletal: Normal range of motion and neck supple.  Cardiovascular:     Rate and Rhythm: Normal rate and regular  rhythm.     Heart sounds: Normal heart sounds.  Pulmonary:     Effort: Pulmonary effort is normal. No respiratory distress.     Breath sounds: Examination of the right-lower field reveals decreased breath sounds. Examination of the left-lower field reveals decreased breath sounds. Decreased breath sounds present.  Abdominal:     General: There is no distension.     Palpations: Abdomen is soft.     Tenderness: There is no abdominal tenderness.  Musculoskeletal: Normal range of motion.        General: No deformity.  Skin:    General: Skin is warm and dry.  Neurological:     Mental Status: She is alert and oriented to person, place, and time.      ED Treatments / Results  Labs (all labs ordered are listed, but only abnormal results are displayed) Labs Reviewed  BRAIN NATRIURETIC PEPTIDE - Abnormal; Notable for the following components:      Result Value   B Natriuretic Peptide 1,282.8 (*)    All other components within normal limits  COMPREHENSIVE METABOLIC PANEL - Abnormal; Notable for the following components:   Sodium 146 (*)    Glucose, Bld 119 (*)    BUN 26 (*)    Total Protein 6.4 (*)    GFR calc non Af Amer 54 (*)    All other components within normal limits  CBC WITH DIFFERENTIAL/PLATELET - Abnormal; Notable for the following components:   RBC 3.18 (*)    Hemoglobin 7.8 (*)    HCT 28.5 (*)    MCH 24.5 (*)    MCHC 27.4 (*)    RDW 16.6 (*)    nRBC 1.3 (*)    All other components within normal limits  TROPONIN I (HIGH SENSITIVITY) - Abnormal; Notable for the following components:   Troponin I (High Sensitivity) 44 (*)    All other components within normal limits  TROPONIN I (HIGH SENSITIVITY) - Abnormal; Notable for the following components:   Troponin I (High Sensitivity) 45 (*)    All other components within normal limits  CULTURE, BLOOD (ROUTINE X 2)  SARS CORONAVIRUS 2 (HOSPITAL ORDER, [facility])  CULTURE, BLOOD (ROUTINE X 2)   LACTIC ACID, PLASMA  LACTIC ACID, PLASMA  URINALYSIS, ROUTINE W REFLEX MICROSCOPIC    EKG EKG Interpretation  Date/Time:  Monday  December 24 2018 13:17:14 EDT Ventricular Rate:  70 PR Interval:    QRS Duration: 100 QT Interval:  421 QTC Calculation: 455 R Axis:   -29 Text Interpretation:  Sinus rhythm Borderline left axis deviation Borderline low voltage, extremity leads Nonspecific T abnormalities, inferior leads Confirmed by Dene Gentry [id number]) on 12/24/2018 1:41:32 PM   Radiology Dg Chest Port 1 View  Result Date: 12/24/2018 CLINICAL DATA:  Dyspnea. EXAM: PORTABLE CHEST 1 VIEW COMPARISON:  Radiograph of November 03, 2017. FINDINGS: Stable cardiomegaly. Atherosclerosis of thoracic aorta is noted. No pneumothorax or pleural effusion is noted. Minimal bibasilar subsegmental atelectasis is noted. Severe degenerative changes seen involving both glenohumeral joints. IMPRESSION: Minimal bibasilar subsegmental atelectasis. Aortic Atherosclerosis (ICD10-I70.0). Electronically Signed   By: Marijo Conception M.D.   On: 12/24/2018 14:28    Procedures Procedures (including critical care time)  Medications Ordered in ED Medications - No data to display   Initial Impression / Assessment and Plan / ED Course  I have reviewed the triage vital signs and the nursing notes.  Pertinent labs & imaging results that were available during my care of the patient were reviewed by me and considered in my medical decision making (see chart for details).        MDM  Screen complete  Jacqueline [name] was evaluated in Emergency Department on 12/24/2018 for the symptoms described in the history of present illness. She was evaluated in the context of the global COVID-19 pandemic, which necessitated consideration that the patient might be at risk for infection with the SARS-CoV-2 virus that causes COVID-19. Institutional protocols and algorithms that pertain to the evaluation of patients at risk for COVID-19 are in a  state of rapid change based on information released by regulatory bodies including the CDC and federal and state organizations. These policies and algorithms were followed during the patient's care in the ED.   Patient is presenting with reported shortness of breath.  Initial work-up suggested that she may be experiencing CHF.  Patient will require admission for further work-up and treatment.  Hospitalist service Mickel Crow) is aware of case and will evaluate for admission.  CTA is pending at time of admission.   Final Clinical Impressions(s) / ED Diagnoses   Final diagnoses:  Dyspnea, unspecified type    ED Discharge Orders    None       Valarie Merino, MD 12/24/18 [phone]

## 2018-12-24 NOTE — Progress Notes (Signed)
Report received from Eugene Garnet, ED nurse at 2134. Patient arrived to room 1426 at 2150 via stretcher with belongings, O2 at 4L via [place] in use and continued. Transferred over to bed. She arrived alert and oriented x4 and able to fully participate in admission assessment and history. Skin assessment completed and verified by 2nd nurse. Peri-care provided and purewick applied. Clean gown applied. Tele applied and verified with monitoring personnel and 2nd nurse. Patient denied pain. Safety measures in place.

## 2018-12-24 NOTE — ED Notes (Signed)
Report given to [place], [place]

## 2018-12-24 NOTE — ED Notes (Signed)
Patient transported to CT 

## 2018-12-24 NOTE — ED Triage Notes (Signed)
Pt BIB EMS from home. Pt son called, c/o SOB. Pt sat high 70's with EMS upon arrival. Pt 96% 4L [place]. Pt has chronic R knee pain.   119/55

## 2018-12-25 ENCOUNTER — Inpatient Hospital Stay (HOSPITAL_COMMUNITY): Payer: Medicare Other

## 2018-12-25 DIAGNOSIS — I350 Nonrheumatic aortic (valve) stenosis: Secondary | ICD-10-CM

## 2018-12-25 DIAGNOSIS — I361 Nonrheumatic tricuspid (valve) insufficiency: Secondary | ICD-10-CM

## 2018-12-25 LAB — CBC
HCT: 28.3 % — ABNORMAL LOW (ref 36.0–46.0)
Hemoglobin: 7.7 g/dL — ABNORMAL LOW (ref 12.0–15.0)
MCH: 24.4 pg — ABNORMAL LOW (ref 26.0–34.0)
MCHC: 27.2 g/dL — ABNORMAL LOW (ref 30.0–36.0)
MCV: 89.6 fL (ref 80.0–100.0)
Platelets: 142 10*3/uL — ABNORMAL LOW (ref 150–400)
RBC: 3.16 MIL/uL — ABNORMAL LOW (ref 3.87–5.11)
RDW: 16.4 % — ABNORMAL HIGH (ref 11.5–15.5)
WBC: 6.4 10*3/uL (ref 4.0–10.5)
nRBC: 0.8 % — ABNORMAL HIGH (ref 0.0–0.2)

## 2018-12-25 LAB — ECHOCARDIOGRAM COMPLETE
Height: 60 in
Weight: 2666.68 oz

## 2018-12-25 LAB — BASIC METABOLIC PANEL
Anion gap: 7 (ref 5–15)
BUN: 20 mg/dL (ref 8–23)
CO2: 28 mmol/L (ref 22–32)
Calcium: 8.7 mg/dL — ABNORMAL LOW (ref 8.9–10.3)
Chloride: 107 mmol/L (ref 98–111)
Creatinine, Ser: 0.83 mg/dL (ref 0.44–1.00)
GFR calc Af Amer: >60 mL/min (ref >60)
GFR calc non Af Amer: >60 mL/min (ref >60)
Glucose, Bld: 104 mg/dL — ABNORMAL HIGH (ref 70–99)
Potassium: 3.3 mmol/L — ABNORMAL LOW (ref 3.5–5.1)
Sodium: 142 mmol/L (ref 135–145)

## 2018-12-25 LAB — BRAIN NATRIURETIC PEPTIDE: B Natriuretic Peptide: 993.4 pg/mL — ABNORMAL HIGH (ref 0.0–100.0)

## 2018-12-25 LAB — TSH: TSH: 1.004 u[IU]/mL (ref 0.350–4.500)

## 2018-12-25 MED ORDER — POTASSIUM CHLORIDE CRYS ER 20 MEQ PO TBCR
40.0000 meq | EXTENDED_RELEASE_TABLET | Freq: Three times a day (TID) | ORAL | Status: AC
  Administered 2018-12-25 (×2): 40 meq via ORAL
  Filled 2018-12-25 (×2): qty 2

## 2018-12-25 NOTE — Plan of Care (Signed)
  Problem: Education: Goal: Knowledge of General Education information will improve Description: Including pain rating scale, medication(s)/side effects and non-pharmacologic comfort measures Outcome: Progressing   Problem: Health Behavior/Discharge Planning: Goal: Ability to manage health-related needs will improve Outcome: Progressing   Problem: Clinical Measurements: Goal: Ability to maintain clinical measurements within normal limits will improve Outcome: Progressing Goal: Will remain free from infection Outcome: Progressing Goal: Diagnostic test results will improve Outcome: Progressing Goal: Respiratory complications will improve Outcome: Progressing Goal: Cardiovascular complication will be avoided Outcome: Progressing   Problem: Activity: Goal: Risk for activity intolerance will decrease Outcome: Progressing   Problem: Nutrition: Goal: Adequate nutrition will be maintained Outcome: Progressing   Problem: Elimination: Goal: Will not experience complications related to urinary retention Outcome: Progressing   Problem: Pain Managment: Goal: General experience of comfort will improve Outcome: Progressing   Problem: Safety: Goal: Ability to remain free from injury will improve Outcome: Progressing   Problem: Skin Integrity: Goal: Risk for impaired skin integrity will decrease Outcome: Progressing   

## 2018-12-25 NOTE — Progress Notes (Signed)
[facility] Progress Note  Subjective: doing well today, no chg Hb 7's, no guiac sent yet, good UOP w/ purewick and IV lasix - 1L overnight and another 1L today so far.  Fe def on anemia panel, folate/ B12 ok  Vitals:   12/24/18 2130 12/24/18 2220 12/25/18 0448 12/25/18 0500  BP: (!) 161/70 (!) 154/99 (!) 113/49   Pulse: 65 68 68   Resp: (!) 25 17 16    Temp:  97.6 F (36.4 C) 98 F (36.7 C)   TempSrc:  Oral Oral   SpO2: 100% 100% 95%   Weight:  75.6 kg  75.6 kg  Height:  5' (1.524 m)      Inpatient medications: . diltiazem  180 mg Oral BID  . furosemide  20 mg Intravenous Q12H  . metoprolol tartrate  25 mg Oral BID  . pantoprazole  40 mg Oral Daily  . senna  1 tablet Oral BID  . sodium chloride flush  3 mL Intravenous Q12H   . sodium chloride     sodium chloride, acetaminophen **OR** acetaminophen, ALPRAZolam, bisacodyl, ondansetron **OR** ondansetron (ZOFRAN) IV, polyethylene glycol, polyvinyl alcohol, sodium chloride flush, sodium phosphate    Exam: Gen alert, no distress  No rash, cyanosis or gangrene Sclera anicteric, throat clear No jvd or bruits Chest no rales or wheezing, good air movement RRR no MRG Abd soft ntnd no mass or ascites +bs GU purewick w/ clear yellow urine in canister MS no joint effusions or deformity, R knee w/o warmth/ effusion Ext 1+ pretib edema is resolving Neuro is alert, Ox 3 , nf   HPI:  Jacqueline [name] is a 85 y.o. female with hx of  HTN, CVA's, OA, PAF , chron diast HF who presented to ED w/ SOB. EMS found sats in high 70's improved to 96% on 4L [place]. Also she has chronic R knee pain.  In ED CXR was negative w/ some atx, BNP was high at 1200 and CTA chest was negative for PE. Hb is 7.8 (usual 14, lowest prior 8.6).   Asked to see for admission.  Took O2 off in ED and sat's dropped to low 80's after 3- 4 minutes.  Patient denies prod cough, fever, chills, CP, abd pain, n/v/d.  Has chronic R knee pain. Vague historian. Hx of diast HF.   Main SOB c/o is DOE.    Patient states has episodic rectal bleeding every once in a while and her doctors "build it back up". Says it has been happening for "years". Has not had any recent rectal bleeding, black stools , nosebleeds or other bleeding.  Admit Nov 2015 - R arm fracture, essent HTN, CVA, OA, PAF, chron diast HF, L humerus fx after fall Admit Feb 2016 - admit for acute R MCA stroke, afib w/ RVR, HTN, and HL; not TPA candidate due to xarelto and minimal deficits and time since onset Admit June 2019 - speech difficulties, acute CVA, speech chgs resolved. Acute infarction in R post parietal cortex. Per neuro cont Eliquis. EF 55% Admit Jul 2019 - acute encephalopathy, hx dementia. No infection, head CT negatve. MRI poor study. B12 , TSH, utox, NH3 negative.  Cont prn xanax    Home meds:  - alprazolam 0.125 bid prn/ donepezil 5 hs  - pantoprazole 40 qd  - apixaban 5 bid  - diltiazem 180 bid/ metoprolol 25 bid  - prn sodium phosphate enemas/ MVI/ MOM prn/ vit D3     UA hazy, +nitrite, 30 protein, pH 5.0  Few bact, 21-50 wbc, 0-5 rbc  EKG (independ reviewed) > NSR no acute, 76 bpm  CXR (independ reviewed) > IMPRESSION: Minimal bibasilar subsegmental atelectasis     Hospital Course:  Dyspnea/ hypoxemic acute resp failure/ new anemia -  ^BNP and some early ground glass on CT (my read) suggesting CHF exacerbation. Also quite anemia. No signs of PNA, CXR neg, and CTA neg for PE, no hx COPD, nonsmoker, no COPD on CT.  May have some degree of OHS - getting IV lasix 20 bid , diuresing well, will repeat RA SpO2 and cont IV lasix - check BMP in am - has lasix 20 qd at home but no taking of late - check RA sat daily  Anemia - fe deficiency . Hb 7.8,  Normocytic. Pt states has had rectal bleeding issues w/ anticoagulants and her primary has tried many different kinds of anticoagulants. Denies recent GI bleed. Last Hb was 15 in July 2019 - tsat / ferritin are sig low c/w Fe def anemia > will  order 510mg  IV iron feraheme - consider po Fe at dc - stool guiac not done yet, may need rectal exam - B12, folate wnl  Afib - holding eliquis, cont dilt and metoprolol - in NSR by EKG, normal HR  HTN - bp's stable 140 /80 range, cont BB/ dilt  Dementia - mild, cont donepezil  Constipation - not active, prn's as needed  Chronic R knee pain - affects walking, gets cortisone shots every so often. Nothing on exam. Ortho as needed  Hx CVA's    Code Status: full DVT Prophylaxis: SCD's Family Communication: d/w daughter on phone Disposition Plan: home 1-2 days Status: IP, tele    Kelly Splinter / [facility] [phone] 12/25/2018, 1:12 PM   Recent Labs  Lab 12/24/18 1330 12/25/18 0412  NA 146* 142  K 3.8 3.3*  CL 110 107  CO2 27 28  GLUCOSE 119* 104*  BUN 26* 20  CREATININE 0.94 0.83  CALCIUM 9.5 8.7*   Recent Labs  Lab 12/24/18 1330  AST 24  ALT 38  ALKPHOS 88  BILITOT 0.6  PROT 6.4*  ALBUMIN 3.5   Recent Labs  Lab 12/24/18 1330 12/25/18 0412  WBC 5.4 6.4  NEUTROABS 4.2  --   HGB 7.8* 7.7*  HCT 28.5* 28.3*  MCV 89.6 89.6  PLT 163 142*   Iron/TIBC/Ferritin/ %Sat    Component Value Date/Time   IRON 16 (L) 12/24/2018 1907   TIBC 459 (H) 12/24/2018 1907   FERRITIN 8 (L) 12/24/2018 1907   IRONPCTSAT 3 (L) 12/24/2018 1907

## 2018-12-25 NOTE — Progress Notes (Signed)
  Echocardiogram 2D Echocardiogram has been performed.  Darlina Sicilian M 12/25/2018, 9:31 AM

## 2018-12-26 LAB — CBC
HCT: 31 % — ABNORMAL LOW (ref 36.0–46.0)
Hemoglobin: 8.6 g/dL — ABNORMAL LOW (ref 12.0–15.0)
MCH: 24.5 pg — ABNORMAL LOW (ref 26.0–34.0)
MCHC: 27.7 g/dL — ABNORMAL LOW (ref 30.0–36.0)
MCV: 88.3 fL (ref 80.0–100.0)
Platelets: 156 10*3/uL (ref 150–400)
RBC: 3.51 MIL/uL — ABNORMAL LOW (ref 3.87–5.11)
RDW: 16.6 % — ABNORMAL HIGH (ref 11.5–15.5)
WBC: 6 10*3/uL (ref 4.0–10.5)
nRBC: 0.5 % — ABNORMAL HIGH (ref 0.0–0.2)

## 2018-12-26 LAB — BASIC METABOLIC PANEL
Anion gap: 8 (ref 5–15)
BUN: 16 mg/dL (ref 8–23)
CO2: 30 mmol/L (ref 22–32)
Calcium: 8.9 mg/dL (ref 8.9–10.3)
Chloride: 103 mmol/L (ref 98–111)
Creatinine, Ser: 0.92 mg/dL (ref 0.44–1.00)
GFR calc Af Amer: >60 mL/min (ref >60)
GFR calc non Af Amer: 56 mL/min — ABNORMAL LOW (ref >60)
Glucose, Bld: 105 mg/dL — ABNORMAL HIGH (ref 70–99)
Potassium: 4.3 mmol/L (ref 3.5–5.1)
Sodium: 141 mmol/L (ref 135–145)

## 2018-12-26 LAB — BRAIN NATRIURETIC PEPTIDE: B Natriuretic Peptide: 487 pg/mL — ABNORMAL HIGH (ref 0.0–100.0)

## 2018-12-26 LAB — OCCULT BLOOD X 1 CARD TO LAB, STOOL: Fecal Occult Bld: NEGATIVE

## 2018-12-26 MED ORDER — POLYSACCHARIDE IRON COMPLEX 150 MG PO CAPS
150.0000 mg | ORAL_CAPSULE | Freq: Every day | ORAL | Status: DC
  Administered 2018-12-26 – 2018-12-28 (×3): 150 mg via ORAL
  Filled 2018-12-26 (×3): qty 1

## 2018-12-26 MED ORDER — APIXABAN 5 MG PO TABS
5.0000 mg | ORAL_TABLET | Freq: Two times a day (BID) | ORAL | Status: DC
  Administered 2018-12-26 – 2018-12-28 (×5): 5 mg via ORAL
  Filled 2018-12-26 (×5): qty 1

## 2018-12-26 NOTE — Progress Notes (Addendum)
[facility]                                                                              Patient Demographics  Jacqueline [name], is a 85 y.o. female, DOB - 01/19/1929, [id number]  Admit date - 12/24/2018   Admitting Physician Roney Jaffe, MD  Outpatient Primary MD for the patient is Lajean Manes, MD  Outpatient specialists:   LOS - 2  days   Medical records reviewed and are as summarized below:    Chief Complaint  Patient presents with  . Shortness of Breath  . Knee Pain       Brief summary   Patient is a 85 y.o. female with hx of  HTN, CVA's, OA, PAF , chron diast HF who presented to ED w/ SOB and dyspnea on exertion. EMS found sats in high 70's improved to 96% on 4L [place]. Also she has chronic R knee pain.  In ED CXR was negative w/ some atx, BNP was high at 1200 and CTA chest was negative for PE. Hb is 7.8 (usual 14, lowest prior 8.6).   Took O2 off in ED and sat's dropped to low 80's after 3- 4 minutes.  Patient denied prod cough, fever, chills, CP, abd pain, n/v/d.  Has chronic R knee pain. Vague historian. Hx of diast CHF.   Patient states has episodic rectal bleeding every once in a while and her doctors "build it back up". Says it has been happening for "years". Has not had any recent rectal bleeding, black stools , nosebleeds or other bleeding.    Assessment & Plan    Principal Problem:   Acute hypoxemic respiratory failure ([place]), multifactorial likely due to acute on chronic diastolic CHF,  anemia -Patient was placed on IV Lasix 20 mg twice daily, (oral Lasix 20 mg daily at home however not taking). -Continue I's and O's and strict daily weights negative balance of 2 L -O2 sats improving, wean O2, this morning 92% on room air - will continue IV Lasix today, transition back to oral Lasix in a.m. -2D echo 8/25 showed EF of 60 to 65%  Active Problems:   Symptomatic anemia -Iron deficiency, hemoglobin was 7.8.  Per patient, has had rectal bleeding  issues, chronically with anticoagulation and her primary physician has tried many different kind of anticoagulants. -Denies any recent GI bleed, anemia profile consistent with iron deficiency anemia, received 1 dose of IV Feraheme on 8/25 - will place on oral Niferex - B12 and folate normal, FOBT pending    PAF (paroxysmal atrial fibrillation) (HCC) -Rate controlled, currently Eliquis on hold however given history of multiple CVAs, still functional, will restart Eliquis and follow H&H. Daughter agrees with the plan. -Rate controlled, continue beta-blocker, diltiazem    Benign essential HTN -BP stable, continue Lasix, diltiazem, metoprolol    Chronic knee pain -Currently stable, gets cortisone shots outpatient. -PT and OT evaluation    History of multiple cerebrovascular accidents (CVAs) -Prior history of CVAs, has paroxysmal atrial fibrillation, restart Eliquis  Dementia Mild, continue donepezil   Code Status: Full code DVT Prophylaxis: Eliquis Family Communication: Discussed all imaging results, lab results,  explained to the patient's daughter   Disposition Plan: Plan to continue Lasix IV for 1 more day, PT OT evaluation, possible DC home in a.m. if back to baseline.  Discussed with daughter and agree with the plan.  At baseline, patient is at home with caregivers and multiple family members support.  Time Spent in minutes 35 minutes  Procedures:  2D echo  Consultants:   None  Antimicrobials:   Anti-infectives (From admission, onward)   None          Medications  Scheduled Meds: . diltiazem  180 mg Oral BID  . furosemide  20 mg Intravenous Q12H  . metoprolol tartrate  25 mg Oral BID  . pantoprazole  40 mg Oral Daily  . senna  1 tablet Oral BID  . sodium chloride flush  3 mL Intravenous Q12H   Continuous Infusions: . sodium chloride     PRN Meds:.sodium chloride, acetaminophen **OR** acetaminophen, ALPRAZolam, bisacodyl, ondansetron **OR** ondansetron  (ZOFRAN) IV, polyethylene glycol, polyvinyl alcohol, sodium chloride flush, sodium phosphate      Subjective:   Mahati Harvan was seen and examined today.  Feeling a lot better today, denies any specific complaints.  On 1 L O2 at the time of my examination.  States shortness of breath is improving.  No chest pain. Patient denies  abdominal pain, N/V/D/C, new weakness, numbess, tingling. No acute events overnight.    Objective:   Vitals:   12/25/18 1339 12/25/18 2035 12/26/18 0428 12/26/18 0547  BP: (!) 121/52 (!) 173/80 (!) 168/77   Pulse: 69 74 76   Resp: 18 16 16    Temp: 98.9 F (37.2 C) 98.7 F (37.1 C) 98.3 F (36.8 C)   TempSrc: Oral Oral Oral   SpO2: 91% 95% 92%   Weight:    73.7 kg  Height:        Intake/Output Summary (Last 24 hours) at 12/26/2018 1054 Last data filed at 12/26/2018 0600 Gross per 24 hour  Intake 123 ml  Output 500 ml  Net -377 ml     Wt Readings from Last 3 Encounters:  12/26/18 73.7 kg  11/03/17 61.7 kg  11/03/17 62.5 kg     Exam  General: Alert and oriented x 3, NAD  Eyes:   HEENT:  Atraumatic, normocephalic  Cardiovascular: S1 S2 auscultated, no murmurs, RRR  Respiratory: Clear to auscultation bilaterally, wheezing, rales or rhonchi  Gastrointestinal: Soft, nontender, nondistended, + bowel sounds  Ext: Trace pedal edema bilaterally  Neuro: No new deficit  Musculoskeletal: No digital cyanosis, clubbing  Skin: No rashes  Psych: Normal affect and demeanor, alert and oriented x3    Data Reviewed:  I have personally reviewed following labs and imaging studies  Micro Results Recent Results (from the past 240 hour(s))  Culture, blood (routine x 2)     Status: None (Preliminary result)   Collection Time: 12/24/18  1:03 PM   Specimen: BLOOD RIGHT HAND  Result Value Ref Range Status   Specimen Description   Final    BLOOD RIGHT HAND Performed at [facility], [place] [address] [place], [place] [address]     Special Requests   Final    BOTTLES DRAWN AEROBIC AND ANAEROBIC Blood Culture adequate volume Performed at [place] [address], [place], [place] [address]    Culture   Final    NO GROWTH < 24 HOURS Performed at [place] [address], [place], [place] [address]  Report Status PENDING  Incomplete  SARS Coronavirus 2 [facility] order, Performed in [facility] hospital lab) Nasopharyngeal Nasopharyngeal Swab     Status: None   Collection Time: 12/24/18  1:07 PM   Specimen: Nasopharyngeal Swab  Result Value Ref Range Status   SARS Coronavirus 2 NEGATIVE NEGATIVE Final    Comment: (NOTE) If result is NEGATIVE SARS-CoV-2 target nucleic acids are NOT DETECTED. The SARS-CoV-2 RNA is generally detectable in upper and lower  respiratory specimens during the acute phase of infection. The lowest  concentration of SARS-CoV-2 viral copies this assay can detect is 250  copies / mL. A negative result does not preclude SARS-CoV-2 infection  and should not be used as the sole basis for treatment or other  patient management decisions.  A negative result may occur with  improper specimen collection / handling, submission of specimen other  than nasopharyngeal swab, presence of viral mutation(s) within the  areas targeted by this assay, and inadequate number of viral copies  (<250 copies / mL). A negative result must be combined with clinical  observations, patient history, and epidemiological information. If result is POSITIVE SARS-CoV-2 target nucleic acids are DETECTED. The SARS-CoV-2 RNA is generally detectable in upper and lower  respiratory specimens dur ing the acute phase of infection.  Positive  results are indicative of active infection with SARS-CoV-2.  Clinical  correlation with patient history and other diagnostic information is  necessary to determine patient infection status.  Positive results do  not rule out bacterial infection or co-infection  with other viruses. If result is PRESUMPTIVE POSTIVE SARS-CoV-2 nucleic acids MAY BE PRESENT.   A presumptive positive result was obtained on the submitted specimen  and confirmed on repeat testing.  While 2019 novel coronavirus  (SARS-CoV-2) nucleic acids may be present in the submitted sample  additional confirmatory testing may be necessary for epidemiological  and / or clinical management purposes  to differentiate between  SARS-CoV-2 and other Sarbecovirus currently known to infect humans.  If clinically indicated additional testing with an alternate test  methodology [phone]) is advised. The SARS-CoV-2 RNA is generally  detectable in upper and lower respiratory sp ecimens during the acute  phase of infection. The expected result is Negative. Fact Sheet for Patients:  StrictlyIdeas.no Fact Sheet for Healthcare Providers: BankingDealers.co.za This test is not yet approved or cleared by the [place] FDA and has been authorized for detection and/or diagnosis of SARS-CoV-2 by FDA under an Emergency Use Authorization (EUA).  This EUA will remain in effect (meaning this test can be used) for the duration of the COVID-19 declaration under Section 564(b)(1) of the Act, 21 U.S.C. section 360bbb-3(b)(1), unless the authorization is terminated or revoked sooner. Performed at [facility], [place] [address], [place], [place] [address]   Culture, blood (routine x 2)     Status: None (Preliminary result)   Collection Time: 12/24/18  1:30 PM   Specimen: BLOOD  Result Value Ref Range Status   Specimen Description   Final    BLOOD LEFT ANTECUBITAL Performed at [facility], [place] [address], [place], [place] [address]    Special Requests   Final    BOTTLES DRAWN AEROBIC AND ANAEROBIC Blood Culture adequate volume Performed at [facility] [address], [place], [place] [address]    Culture    Final    NO GROWTH < 24 HOURS Performed at [place] [address] [place], [place] [address]  Report Status PENDING  Incomplete    Radiology Reports Ct Angio Chest Pe W And/or Wo Contrast  Result Date: 12/24/2018 CLINICAL DATA:  Shortness of breath EXAM: CT ANGIOGRAPHY CHEST WITH CONTRAST TECHNIQUE: Multidetector CT imaging of the chest was performed using the standard protocol during bolus administration of intravenous contrast. Multiplanar CT image reconstructions and MIPs were obtained to evaluate the vascular anatomy. CONTRAST:  122mL OMNIPAQUE IOHEXOL 350 MG/ML SOLN COMPARISON:  Portable chest x-ray earlier today. FINDINGS: Cardiovascular: Cardiomegaly. Tortuous aorta. The mid descending thoracic aorta is mildly aneurysmal measuring up to 4.2 cm. Aortic arch measures 3.3 cm. Ascending thoracic aorta measures 3.7 cm maximally. No filling defects in the pulmonary arteries to suggest pulmonary emboli. Mediastinum/Nodes: Large hiatal hernia. No mediastinal, hilar, or axillary adenopathy. Lungs/Pleura: Small right pleural effusion. Atelectasis in the lower lobes bilaterally. No overt edema. Upper Abdomen: Imaging into the upper abdomen shows no acute findings. Musculoskeletal: Chest wall soft tissues are unremarkable. No acute bony abnormality. Review of the MIP images confirms the above findings. IMPRESSION: Cardiomegaly. Tortuous, atherosclerotic aorta. Mild aneurysmal dilatation of the descending thoracic aorta measuring up to 4.2 cm in the mid descending thoracic aorta. Small right pleural effusion. Large hiatal hernia. Bilateral lower lobe atelectasis. Electronically Signed   By: Rolm Baptise M.D.   On: 12/24/2018 18:34   Dg Chest Port 1 View  Result Date: 12/24/2018 CLINICAL DATA:  Dyspnea. EXAM: PORTABLE CHEST 1 VIEW COMPARISON:  Radiograph of November 03, 2017. FINDINGS: Stable cardiomegaly. Atherosclerosis of thoracic aorta is noted. No pneumothorax or pleural effusion is noted.  Minimal bibasilar subsegmental atelectasis is noted. Severe degenerative changes seen involving both glenohumeral joints. IMPRESSION: Minimal bibasilar subsegmental atelectasis. Aortic Atherosclerosis (ICD10-I70.0). Electronically Signed   By: Marijo Conception M.D.   On: 12/24/2018 14:28    Lab Data:  CBC: Recent Labs  Lab 12/24/18 1330 12/25/18 0412 12/26/18 0442  WBC 5.4 6.4 6.0  NEUTROABS 4.2  --   --   HGB 7.8* 7.7* 8.6*  HCT 28.5* 28.3* 31.0*  MCV 89.6 89.6 88.3  PLT 163 142* [id number]   Basic Metabolic Panel: Recent Labs  Lab 12/24/18 1330 12/25/18 0412 12/26/18 0442  NA 146* 142 141  K 3.8 3.3* 4.3  CL 110 107 103  CO2 27 28 30   GLUCOSE 119* 104* 105*  BUN 26* 20 16  CREATININE 0.94 0.83 0.92  CALCIUM 9.5 8.7* 8.9   GFR: Estimated Creatinine Clearance: 37.9 mL/min (by C-G formula based on SCr of 0.92 mg/dL). Liver Function Tests: Recent Labs  Lab 12/24/18 1330  AST 24  ALT 38  ALKPHOS 88  BILITOT 0.6  PROT 6.4*  ALBUMIN 3.5   No results for input(s): LIPASE, AMYLASE in the last 168 hours. No results for input(s): AMMONIA in the last 168 hours. Coagulation Profile: No results for input(s): INR, PROTIME in the last 168 hours. Cardiac Enzymes: No results for input(s): CKTOTAL, CKMB, CKMBINDEX, TROPONINI in the last 168 hours. BNP (last 3 results) No results for input(s): PROBNP in the last 8760 hours. HbA1C: No results for input(s): HGBA1C in the last 72 hours. CBG: No results for input(s): GLUCAP in the last 168 hours. Lipid Profile: No results for input(s): CHOL, HDL, LDLCALC, TRIG, CHOLHDL, LDLDIRECT in the last 72 hours. Thyroid Function Tests: Recent Labs    12/24/18 1912  TSH 1.004   Anemia Panel: Recent Labs    12/24/18 1907  VITAMINB12 1,114*  FOLATE 40.6  FERRITIN 8*  TIBC 459*  IRON 16*  RETICCTPCT 2.2   Urine analysis:    Component Value Date/Time   COLORURINE YELLOW 12/24/2018 1303   APPEARANCEUR HAZY (A) 12/24/2018 1303    LABSPEC 1.027 12/24/2018 1303   PHURINE 5.0 12/24/2018 1303   GLUCOSEU NEGATIVE 12/24/2018 1303   HGBUR NEGATIVE 12/24/2018 1303   BILIRUBINUR NEGATIVE 12/24/2018 1303   KETONESUR NEGATIVE 12/24/2018 1303   PROTEINUR 30 (A) 12/24/2018 1303   UROBILINOGEN 0.2 06/18/2014 1950   NITRITE POSITIVE (A) 12/24/2018 1303   LEUKOCYTESUR MODERATE (A) 12/24/2018 1303     Kaislyn Gulas M.D. [facility] 12/26/2018, 10:54 AM  Pager: [phone] Between 7am to 7pm - call Pager - [phone]  After 7pm go to [email or web address] - password [email or web address]  Call night coverage person covering after 7pm

## 2018-12-26 NOTE — Evaluation (Signed)
Physical Therapy Evaluation Patient Details Name: Jacqueline [name] MRN: [id number] DOB: [birth year] Today's Date: 12/26/2018   History of Present Illness  85 yo female admitted with acute respiratory failure, R knee pain affecting pt's abilty to ambulate. Hx of HTN, CVA, OA, PAF, HF, chronic R knee pain  Clinical Impression  On eval, pt required Min-Mod assist for mobility. Mod encouragement for pt to mobilize. She was able to stand and pivot. Recliner brought up behind her to safely sit. Pt reported increased pain with any activity/weightbearing. Encouraged pt to sit up in the recliner for as long as she could tolerate it. No family present during session. Recommend HHPT and 24 hour supervision/assist as long as family feels they can manage pt's care at home.     Follow Up Recommendations Home health PT;Supervision/Assistance - 24 hour    Equipment Recommendations  Rolling walker with 5" wheels;3in1 (PT)    Recommendations for Other Services       Precautions / Restrictions Precautions Precautions: Fall Restrictions Weight Bearing Restrictions: No      Mobility  Bed Mobility Overal bed mobility: Needs Assistance Bed Mobility: Supine to Sit     Supine to sit: Mod assist;HOB elevated     General bed mobility comments: Assist for LEs and to scoot to EOB. Increased time. Utilized bedpad. Cues for technique.  Transfers Overall transfer level: Needs assistance Equipment used: Rolling walker (2 wheeled) Transfers: Sit to/from [facility] Sit to Stand: Min assist;From elevated surface Stand pivot transfers: Min assist       General transfer comment: Assist to rise, stabilize, control descent. VCs safety, technique, hand placement. Stand pivot, bed to recliner, with RW  Ambulation/Gait             General Gait Details: Nt-pt unable to tolerate 2* knee pain  Stairs            Wheelchair Mobility    Modified Rankin (Stroke Patients Only)        Balance Overall balance assessment: Needs assistance         Standing balance support: Bilateral upper extremity supported Standing balance-Leahy Scale: Poor                               Pertinent Vitals/Pain Pain Assessment: Faces Faces Pain Scale: Hurts even more Pain Location: R knee pain with activity/WBing Pain Descriptors / Indicators: Sharp;Sore;Guarding;Aching;Discomfort Pain Intervention(s): Limited activity within patient's tolerance;Monitored during session;Repositioned    Home Living Family/patient expects to be discharged to:: Private residence Living Arrangements: Children;Other (Comment)   Type of Home: House         Home Equipment: Walker - 4 wheels      Prior Function Level of Independence: Needs assistance   Gait / Transfers Assistance Needed: was using rollator for ambulation until knee pain worsened           Hand Dominance        Extremity/Trunk Assessment   Upper Extremity Assessment Upper Extremity Assessment: Generalized weakness    Lower Extremity Assessment Lower Extremity Assessment: Generalized weakness    Cervical / Trunk Assessment Cervical / Trunk Assessment: Kyphotic  Communication   Communication: No difficulties  Cognition Arousal/Alertness: Awake/alert Behavior During Therapy: WFL for tasks assessed/performed Overall Cognitive Status: No family/caregiver present to determine baseline cognitive functioning Area of Impairment: Memory;Problem solving  Memory: Decreased short-term memory       Problem Solving: Requires tactile cues;Requires verbal cues        General Comments      Exercises     Assessment/Plan    PT Assessment Patient needs continued PT services  PT Problem List Decreased strength;Decreased range of motion;Decreased mobility;Decreased activity tolerance;Decreased balance;Decreased knowledge of use of DME;Pain       PT Treatment Interventions  DME instruction;Gait training;Therapeutic activities;Patient/family education;Therapeutic exercise;Balance training;Functional mobility training    PT Goals (Current goals can be found in the Care Plan section)  Acute Rehab PT Goals Patient Stated Goal: less pain PT Goal Formulation: With patient Time For Goal Achievement: 01/16/19 Potential to Achieve Goals: Fair    Frequency Min 3X/week   Barriers to discharge        Co-evaluation               AM-PAC PT "6 Clicks" Mobility  Outcome Measure Help needed turning from your back to your side while in a flat bed without using bedrails?: A Little Help needed moving from lying on your back to sitting on the side of a flat bed without using bedrails?: A Lot Help needed moving to and from a bed to a chair (including a wheelchair)?: A Little Help needed standing up from a chair using your arms (e.g., wheelchair or bedside chair)?: A Little Help needed to walk in hospital room?: A Lot Help needed climbing 3-5 steps with a railing? : A Lot 6 Click Score: 15    End of Session Equipment Utilized During Treatment: Gait belt Activity Tolerance: Patient limited by pain Patient left: in chair;with call bell/phone within reach;with chair alarm set   PT Visit Diagnosis: Pain;Muscle weakness (generalized) (M62.81);Difficulty in walking, not elsewhere classified (R26.2);History of falling (Z91.81) Pain - Right/Left: Right Pain - part of body: Knee    Time: 1203-1229 PT Time Calculation (min) (ACUTE ONLY): 26 min   Charges:   PT Evaluation $PT Eval Moderate Complexity: 1 Mod PT Treatments $Therapeutic Activity: 8-22 mins          Weston Anna, PT Acute Rehabilitation Services Pager: [phone] Office: [phone]

## 2018-12-27 ENCOUNTER — Inpatient Hospital Stay (HOSPITAL_COMMUNITY): Payer: Medicare Other

## 2018-12-27 LAB — BASIC METABOLIC PANEL
Anion gap: 11 (ref 5–15)
BUN: 16 mg/dL (ref 8–23)
CO2: 29 mmol/L (ref 22–32)
Calcium: 9.1 mg/dL (ref 8.9–10.3)
Chloride: 98 mmol/L (ref 98–111)
Creatinine, Ser: 0.96 mg/dL (ref 0.44–1.00)
GFR calc Af Amer: >60 mL/min (ref >60)
GFR calc non Af Amer: 53 mL/min — ABNORMAL LOW (ref >60)
Glucose, Bld: 114 mg/dL — ABNORMAL HIGH (ref 70–99)
Potassium: 4.2 mmol/L (ref 3.5–5.1)
Sodium: 138 mmol/L (ref 135–145)

## 2018-12-27 LAB — CBC
HCT: 34.5 % — ABNORMAL LOW (ref 36.0–46.0)
Hemoglobin: 9.5 g/dL — ABNORMAL LOW (ref 12.0–15.0)
MCH: 23.9 pg — ABNORMAL LOW (ref 26.0–34.0)
MCHC: 27.5 g/dL — ABNORMAL LOW (ref 30.0–36.0)
MCV: 86.9 fL (ref 80.0–100.0)
Platelets: 175 10*3/uL (ref 150–400)
RBC: 3.97 MIL/uL (ref 3.87–5.11)
RDW: 16.5 % — ABNORMAL HIGH (ref 11.5–15.5)
WBC: 6.4 10*3/uL (ref 4.0–10.5)
nRBC: 0.3 % — ABNORMAL HIGH (ref 0.0–0.2)

## 2018-12-27 MED ORDER — DILTIAZEM HCL ER COATED BEADS 180 MG PO CP24
180.0000 mg | ORAL_CAPSULE | Freq: Two times a day (BID) | ORAL | Status: DC
  Administered 2018-12-27 – 2018-12-28 (×2): 180 mg via ORAL
  Filled 2018-12-27 (×3): qty 1

## 2018-12-27 MED ORDER — FUROSEMIDE 40 MG PO TABS: 40.0000 mg | ORAL_TABLET | Freq: Every day | ORAL | 3 refills | Status: DC

## 2018-12-27 MED ORDER — POLYSACCHARIDE IRON COMPLEX 150 MG PO CAPS: 150.0000 mg | ORAL_CAPSULE | Freq: Every day | ORAL | 1 refills | Status: AC

## 2018-12-27 NOTE — Care Management Important Message (Signed)
Important Message  Patient Details IM Letter given to Rhea Pink SW to present to the Patient Name: Jacqueline [name] MRN: [id number] Date of Birth: 09-Apr-1929   Medicare Important Message Given:  Yes     Kerin Salen 12/27/2018, 11:23 AM

## 2018-12-27 NOTE — Evaluation (Signed)
Occupational Therapy Evaluation Patient Details Name: Jacqueline [name] MRN: [id number] DOB: 1929-01-26 Today's Date: 12/27/2018    History of Present Illness 85 yo female admitted with acute respiratory failure, R knee pain affecting pt's abilty to ambulate. Hx of HTN, CVA, OA, PAF, HF, chronic R knee pain   Clinical Impression   Pt admitted with R knee pain. Pt currently with functional limitations due to the deficits listed below (see OT Problem List).  Pt will benefit from skilled OT to increase their safety and independence with ADL and functional mobility for ADL to facilitate discharge to venue listed below.      Follow Up Recommendations  Supervision/Assistance - 24 hour;Home health OT    Equipment Recommendations  None recommended by OT    Recommendations for Other Services       Precautions / Restrictions Precautions Precautions: Fall Restrictions Weight Bearing Restrictions: No      Mobility Bed Mobility Overal bed mobility: Needs Assistance Bed Mobility: Supine to Sit;Sit to Supine     Supine to sit: HOB elevated;Min assist Sit to supine: Mod assist   General bed mobility comments: Assist for LEs and to scoot to EOB. Increased time. Utilized bedpad. Cues for technique.  Transfers Overall transfer level: Needs assistance Equipment used: Rolling walker (2 wheeled) Transfers: Sit to/from Stand Sit to Stand: Min assist;From elevated surface         General transfer comment: Assist to rise, stabilize, control descent. VCs safety, technique, hand placement.    Balance Overall balance assessment: Needs assistance         Standing balance support: Bilateral upper extremity supported Standing balance-Leahy Scale: Poor                             ADL either performed or assessed with clinical judgement   ADL Overall ADL's : Needs assistance/impaired Eating/Feeding: Set up;Sitting   Grooming: Set up;Sitting   Upper Body Bathing: Minimal  assistance;Sitting   Lower Body Bathing: Maximal assistance;Sit to/from stand;Cueing for safety;Cueing for compensatory techniques;Cueing for sequencing   Upper Body Dressing : Minimal assistance;Sitting   Lower Body Dressing: Maximal assistance;Cueing for safety;Cueing for sequencing;Sit to/from stand                 General ADL Comments: sit to stand only from EOB.  Pt stated she did feel a little nausous and wanted to return to supine.  CNA notifed.  Emesis bag obtained and saltines offered.  Pt stated saltines helped but wanted to stay in bed.     Vision Patient Visual Report: No change from baseline              Pertinent Vitals/Pain Pain Assessment: Faces Pain Score: 4  Pain Location: R knee pain with activity/WBing Pain Descriptors / Indicators: Sharp;Sore;Guarding;Discomfort Pain Intervention(s): Limited activity within patient's tolerance;Monitored during session        Extremity/Trunk Assessment         Cervical / Trunk Assessment Cervical / Trunk Assessment: Kyphotic   Communication Communication Communication: No difficulties   Cognition Arousal/Alertness: Awake/alert Behavior During Therapy: WFL for tasks assessed/performed Overall Cognitive Status: No family/caregiver present to determine baseline cognitive functioning Area of Impairment: Memory;Problem solving                     Memory: Decreased short-term memory       Problem Solving: Requires tactile cues;Requires verbal cues  Home Living Family/patient expects to be discharged to:: Private residence Living Arrangements: Children;Other (Comment) Available Help at Discharge: Family Type of Home: House             Bathroom Shower/Tub: Walk-in shower   Bathroom Toilet: Handicapped height     Home Equipment: Environmental consultant - 4 wheels          Prior Functioning/Environment Level of Independence: Needs assistance  Gait / Transfers Assistance Needed: was  using rollator for ambulation until knee pain worsened              OT Problem List: Decreased strength;Decreased activity tolerance;Impaired balance (sitting and/or standing)      OT Treatment/Interventions: Self-care/ADL training;Patient/family education;DME and/or AE instruction;Therapeutic activities    OT Goals(Current goals can be found in the care plan section) Acute Rehab OT Goals Patient Stated Goal: less pain OT Goal Formulation: With patient Time For Goal Achievement: 01/01/19  OT Frequency: Min 2X/week    AM-PAC OT "6 Clicks" Daily Activity     Outcome Measure Help from another person eating meals?: None Help from another person taking care of personal grooming?: A Little Help from another person toileting, which includes using toliet, bedpan, or urinal?: A Lot Help from another person bathing (including washing, rinsing, drying)?: A Lot Help from another person to put on and taking off regular upper body clothing?: A Little Help from another person to put on and taking off regular lower body clothing?: A Lot 6 Click Score: 16   End of Session Equipment Utilized During Treatment: Rolling walker Nurse Communication: Mobility status  Activity Tolerance: Other (comment)(pt felt nauseated) Patient left: in bed;with call bell/phone within reach;with bed alarm set  OT Visit Diagnosis: Unsteadiness on feet (R26.81);Other abnormalities of gait and mobility (R26.89);Muscle weakness (generalized) (M62.81);History of falling (Z91.81)                Time: 1140-1155 OT Time Calculation (min): 15 min Charges:  OT General Charges $OT Visit: 1 Visit OT Evaluation $OT Eval Moderate Complexity: 1 Mod  Kari Baars, OT Acute Rehabilitation Services [phone] Office- [phone]     Jacqueline [name], Jacqueline [name] 12/27/2018, 1:56 PM

## 2018-12-27 NOTE — Progress Notes (Signed)
SATURATION QUALIFICATIONS: (This note is used to comply with regulatory documentation for home oxygen)  Patient Saturations on Room Air at Rest = 85%  Patient Saturations on Room Air while Ambulating = 85%  Patient Saturations on 2 Liters of oxygen while Ambulating = 95%  Please briefly explain why patient needs home oxygen:

## 2018-12-27 NOTE — Progress Notes (Signed)
Patient oxygen and 3n1 delivered to room.

## 2018-12-27 NOTE — TOC Progression Note (Signed)
Transition of Care [facility]) - Progression Note    Patient Details  Name: Jacqueline [name] MRN: [id number] Date of Birth: Sep 19, 1928  [facility]) CM/SW [place], LCSW Phone Number: 12/27/2018, 11:36 AM  Clinical Narrative:   [facility] is able to follow patient in the home. [name] stated that he is agreeable with that. [name] requested that the hospital please call him a couple of hours in advance before patient discharge so family is able to set up transport for patient     Expected Discharge Plan: [place] Barriers to Discharge: Continued Medical Work up  Expected Discharge Plan and Services Expected Discharge Plan: [place] In-house Referral: Clinical Social Work   Post Acute Care Choice: [place] arrangements for the past 2 months: [place] Expected Discharge Date: 12/28/18               DME Arranged: Oxygen, 3-N-1 DME Agency: [facility] Date DME Agency Contacted: 12/27/18 Time DME Agency [id number] Representative spoke with at DME Agency: [place]: PT, OT [place] Agency: [place] ([place]) Date [place]: 12/27/18 Time [place]: 1022 Representative spoke with at [place]: [place] ([place]) Interventions    Readmission Risk Interventions No flowsheet data found.

## 2018-12-27 NOTE — Progress Notes (Signed)
[facility]                                                                              Patient Demographics  Jacqueline [name], is a 85 y.o. female, DOB - [birth year], [id number]  Admit date - 12/24/2018   Admitting Physician Roney Jaffe, MD  Outpatient Primary MD for the patient is Lajean Manes, MD  Outpatient specialists:   LOS - 3  days   Medical records reviewed and are as summarized below:    Chief Complaint  Patient presents with   Shortness of Breath   Knee Pain       Brief summary   Patient is a 85 y.o. female with hx of  HTN, CVA's, OA, PAF , chron diast HF who presented to ED w/ SOB and dyspnea on exertion. EMS found sats in high 70's improved to 96% on 4L [place]. Also she has chronic R knee pain.  In ED CXR was negative w/ some atx, BNP was high at 1200 and CTA chest was negative for PE. Hb is 7.8 (usual 14, lowest prior 8.6).   Took O2 off in ED and sat's dropped to low 80's after 3- 4 minutes.  Patient denied prod cough, fever, chills, CP, abd pain, n/v/d.  Has chronic R knee pain. Vague historian. Hx of diast CHF.   Patient states has episodic rectal bleeding every once in a while and her doctors "build it back up". Says it has been happening for "years". Has not had any recent rectal bleeding, black stools , nosebleeds or other bleeding.    Assessment & Plan    Principal Problem:   Acute hypoxemic respiratory failure ([place]), multifactorial likely due to acute on chronic diastolic CHF,  anemia -Patient was placed on IV Lasix 20 mg twice daily, (oral Lasix 20 mg daily at home however not taking). -Continue I's and O's and daily weights, negative balance of 3.3 L -Continue Lasix -2D echo 8/25 showed EF of 60 to 65% -Home O2 evaluation done, requires 2 L O2  Active Problems:   Symptomatic anemia -Iron deficiency, hemoglobin was 7.8.  Per patient, has had rectal bleeding issues, chronically with anticoagulation and her primary physician has tried  many different kind of anticoagulants. -Denies any recent GI bleed, anemia profile consistent with iron deficiency anemia, received 1 dose of IV Feraheme on 8/25 - will place on oral Niferex.  H&H stable - B12 and folate normal, FOBT negative    PAF (paroxysmal atrial fibrillation) (HCC) -Rate controlled, currently Eliquis on hold however given history of multiple CVAs, still functional, will restart Eliquis and follow H&H. Daughter agrees with the plan. -Rate controlled, continue beta-blocker, diltiazem    Benign essential HTN -BP stable, continue Lasix, diltiazem, metoprolol  Acute on chronic knee pain -Currently stable, gets cortisone shots outpatient. -Patient complaining of significant pain in her chronic knee osteoarthritis, states she cannot walk during PT due to pain and has been periodically getting cortisone shots from Dr. Veverly Fells -Stat x-ray showed severe tricompartment degenerative changes, no acute issues -Orthopedics consulted, requested cortisone injection prior to discharge    History of multiple cerebrovascular accidents (CVAs) -Prior  history of CVAs, has paroxysmal atrial fibrillation, restart Eliquis  Dementia Mild, continue donepezil   Code Status: Full code DVT Prophylaxis: Eliquis Family Communication: Discussed all imaging results, lab results, explained to the patient's daughter on the phone this morning   Disposition Plan: Requested Ortho consult this a.m., pending discharge after the cortisone injection in the right knee  Time Spent in minutes 35 minutes  Procedures:  2D echo  Consultants:   None  Antimicrobials:   Anti-infectives (From admission, onward)   None         Medications  Scheduled Meds:  apixaban  5 mg Oral BID   diltiazem  180 mg Oral BID   furosemide  20 mg Intravenous Q12H   iron polysaccharides  150 mg Oral Daily   metoprolol tartrate  25 mg Oral BID   pantoprazole  40 mg Oral Daily   senna  1 tablet Oral BID     sodium chloride flush  3 mL Intravenous Q12H   Continuous Infusions:  sodium chloride     PRN Meds:.sodium chloride, acetaminophen **OR** acetaminophen, ALPRAZolam, bisacodyl, ondansetron **OR** ondansetron (ZOFRAN) IV, polyethylene glycol, polyvinyl alcohol, sodium chloride flush, sodium phosphate      Subjective:   Jacqueline [name] was seen and examined today.  Breathing is better however patient complaining of significant right knee pain, difficult to walk during PT.  States she gets cortisone injection in her right knee by Dr. Veverly Fells.  Shortness of breath slightly better, needs 2 L home O2.  No chest pain, patient denies  abdominal pain, N/V/D/C, new weakness, numbess, tingling. No acute events overnight.    Objective:   Vitals:   12/26/18 1400 12/26/18 2134 12/27/18 0509 12/27/18 1439  BP:  (!) 147/74 (!) 146/67 (!) 103/57  Pulse:  86 73 78  Resp:  20 18 18   Temp:  98.3 F (36.8 C) 98.3 F (36.8 C) 97.9 F (36.6 C)  TempSrc:  Oral    SpO2: 95% 95% 98% 92%  Weight:      Height:        Intake/Output Summary (Last 24 hours) at 12/27/2018 1518 Last data filed at 12/27/2018 0900 Gross per 24 hour  Intake 480 ml  Output 1800 ml  Net -1320 ml     Wt Readings from Last 3 Encounters:  12/26/18 73.7 kg  11/03/17 61.7 kg  11/03/17 62.5 kg   Physical Exam  General: Alert and oriented x 3, NAD  Eyes:   HEENT:  Atraumatic, normocephalic  Cardiovascular: S1 S2 clear, no murmurs, RRR. No pedal edema b/l  Respiratory: CTAB, no wheezing, rales or rhonchi  Gastrointestinal: Soft, nontender, nondistended, NBS  Ext: no pedal edema bilaterally  Neuro: no new deficits  Musculoskeletal: No cyanosis, clubbing  Skin: No rashes  Psych: Normal affect and demeanor, alert and oriented x3     Data Reviewed:  I have personally reviewed following labs and imaging studies  Micro Results Recent Results (from the past 240 hour(s))  Culture, blood (routine x 2)     Status:  None (Preliminary result)   Collection Time: 12/24/18  1:03 PM   Specimen: BLOOD RIGHT HAND  Result Value Ref Range Status   Specimen Description   Final    BLOOD RIGHT HAND Performed at [facility] [address] [place], [place] [address]    Special Requests   Final    BOTTLES DRAWN AEROBIC AND ANAEROBIC Blood Culture adequate volume Performed at [facility]  [address] [place], [place] [address]    Culture   Final    NO GROWTH 3 DAYS Performed at [facility], [place] [address], [place], [place] [address]    Report Status PENDING  Incomplete  SARS Coronavirus 2 [facility] order, Performed in [facility] hospital lab) Nasopharyngeal Nasopharyngeal Swab     Status: None   Collection Time: 12/24/18  1:07 PM   Specimen: Nasopharyngeal Swab  Result Value Ref Range Status   SARS Coronavirus 2 NEGATIVE NEGATIVE Final    Comment: (NOTE) If result is NEGATIVE SARS-CoV-2 target nucleic acids are NOT DETECTED. The SARS-CoV-2 RNA is generally detectable in upper and lower  respiratory specimens during the acute phase of infection. The lowest  concentration of SARS-CoV-2 viral copies this assay can detect is 250  copies / mL. A negative result does not preclude SARS-CoV-2 infection  and should not be used as the sole basis for treatment or other  patient management decisions.  A negative result may occur with  improper specimen collection / handling, submission of specimen other  than nasopharyngeal swab, presence of viral mutation(s) within the  areas targeted by this assay, and inadequate number of viral copies  (<250 copies / mL). A negative result must be combined with clinical  observations, patient history, and epidemiological information. If result is POSITIVE SARS-CoV-2 target nucleic acids are DETECTED. The SARS-CoV-2 RNA is generally detectable in upper and lower  respiratory specimens dur ing the acute phase of infection.   Positive  results are indicative of active infection with SARS-CoV-2.  Clinical  correlation with patient history and other diagnostic information is  necessary to determine patient infection status.  Positive results do  not rule out bacterial infection or co-infection with other viruses. If result is PRESUMPTIVE POSTIVE SARS-CoV-2 nucleic acids MAY BE PRESENT.   A presumptive positive result was obtained on the submitted specimen  and confirmed on repeat testing.  While 2019 novel coronavirus  (SARS-CoV-2) nucleic acids may be present in the submitted sample  additional confirmatory testing may be necessary for epidemiological  and / or clinical management purposes  to differentiate between  SARS-CoV-2 and other Sarbecovirus currently known to infect humans.  If clinically indicated additional testing with an alternate test  methodology [phone]) is advised. The SARS-CoV-2 RNA is generally  detectable in upper and lower respiratory sp ecimens during the acute  phase of infection. The expected result is Negative. Fact Sheet for Patients:  StrictlyIdeas.no Fact Sheet for Healthcare Providers: BankingDealers.co.za This test is not yet approved or cleared by the [place] FDA and has been authorized for detection and/or diagnosis of SARS-CoV-2 by FDA under an Emergency Use Authorization (EUA).  This EUA will remain in effect (meaning this test can be used) for the duration of the COVID-19 declaration under Section 564(b)(1) of the Act, 21 U.S.C. section 360bbb-3(b)(1), unless the authorization is terminated or revoked sooner. Performed at [facility], [place] [address], [place], [place] [address]   Culture, blood (routine x 2)     Status: None (Preliminary result)   Collection Time: 12/24/18  1:30 PM   Specimen: BLOOD  Result Value Ref Range Status   Specimen Description   Final    BLOOD LEFT ANTECUBITAL Performed  at [facility], [place] [address], [place], [place] [address]    Special Requests   Final    BOTTLES DRAWN AEROBIC AND ANAEROBIC Blood Culture adequate volume Performed at [facility]  [address] [place], [place] [address]    Culture   Final    NO GROWTH 3 DAYS Performed at [facility], [place] [address], [place],  [address]    Report Status PENDING  Incomplete    Radiology Reports Dg Knee 1-2 Views Right  Result Date: 12/27/2018 CLINICAL DATA:  Right knee pain. EXAM: RIGHT KNEE - 1-2 VIEW COMPARISON:  02/13/2017. FINDINGS: Severe tricompartment degenerative change, particularly prominent about the lateral compartment. Loose bodies most likely present. No acute bony abnormality identified. Peripheral vascular calcification. IMPRESSION: 1. Severe tricompartment degenerative change, most prominent about the lateral compartment. Loose bodies most likely present. No acute bony abnormality. 2.  Peripheral vascular disease. Electronically Signed   By: [place]   On: 12/27/2018 11:08   Ct Angio Chest Pe W And/or Wo Contrast  Result Date: 12/24/2018 CLINICAL DATA:  Shortness of breath EXAM: CT ANGIOGRAPHY CHEST WITH CONTRAST TECHNIQUE: Multidetector CT imaging of the chest was performed using the standard protocol during bolus administration of intravenous contrast. Multiplanar CT image reconstructions and MIPs were obtained to evaluate the vascular anatomy. CONTRAST:  141mL OMNIPAQUE IOHEXOL 350 MG/ML SOLN COMPARISON:  Portable chest x-ray earlier today. FINDINGS: Cardiovascular: Cardiomegaly. Tortuous aorta. The mid descending thoracic aorta is mildly aneurysmal measuring up to 4.2 cm. Aortic arch measures 3.3 cm. Ascending thoracic aorta measures 3.7 cm maximally. No filling defects in the pulmonary arteries to suggest pulmonary emboli. Mediastinum/Nodes: Large hiatal hernia. No mediastinal, hilar, or axillary adenopathy. Lungs/Pleura: Small right  pleural effusion. Atelectasis in the lower lobes bilaterally. No overt edema. Upper Abdomen: Imaging into the upper abdomen shows no acute findings. Musculoskeletal: Chest wall soft tissues are unremarkable. No acute bony abnormality. Review of the MIP images confirms the above findings. IMPRESSION: Cardiomegaly. Tortuous, atherosclerotic aorta. Mild aneurysmal dilatation of the descending thoracic aorta measuring up to 4.2 cm in the mid descending thoracic aorta. Small right pleural effusion. Large hiatal hernia. Bilateral lower lobe atelectasis. Electronically Signed   By: Rolm Baptise M.D.   On: 12/24/2018 18:34   Dg Chest Port 1 View  Result Date: 12/24/2018 CLINICAL DATA:  Dyspnea. EXAM: PORTABLE CHEST 1 VIEW COMPARISON:  Radiograph of November 03, 2017. FINDINGS: Stable cardiomegaly. Atherosclerosis of thoracic aorta is noted. No pneumothorax or pleural effusion is noted. Minimal bibasilar subsegmental atelectasis is noted. Severe degenerative changes seen involving both glenohumeral joints. IMPRESSION: Minimal bibasilar subsegmental atelectasis. Aortic Atherosclerosis (ICD10-I70.0). Electronically Signed   By: Marijo Conception M.D.   On: 12/24/2018 14:28    Lab Data:  CBC: Recent Labs  Lab 12/24/18 1330 12/25/18 0412 12/26/18 0442 12/27/18 0345  WBC 5.4 6.4 6.0 6.4  NEUTROABS 4.2  --   --   --   HGB 7.8* 7.7* 8.6* 9.5*  HCT 28.5* 28.3* 31.0* 34.5*  MCV 89.6 89.6 88.3 86.9  PLT 163 142* 156 0000000   Basic Metabolic Panel: Recent Labs  Lab 12/24/18 1330 12/25/18 0412 12/26/18 0442 12/27/18 0345  NA 146* 142 141 138  K 3.8 3.3* 4.3 4.2  CL 110 107 103 98  CO2 27 28 30 29   GLUCOSE 119* 104* 105* 114*  BUN 26* 20 16 16   CREATININE 0.94 0.83 0.92 0.96  CALCIUM 9.5 8.7* 8.9 9.1   GFR: Estimated Creatinine Clearance: 36.3 mL/min (by C-G formula based on SCr of 0.96 mg/dL). Liver Function Tests: Recent Labs  Lab 12/24/18 1330  AST 24  ALT 38  ALKPHOS 88  BILITOT 0.6  PROT 6.4*  ALBUMIN 3.5   No results for input(s): LIPASE, AMYLASE in the last 168 hours. No results for input(s): AMMONIA in the last 168 hours. Coagulation Profile: No results for input(s): INR, PROTIME in the last 168 hours. Cardiac Enzymes: No results for input(s): CKTOTAL, CKMB, CKMBINDEX, TROPONINI in the last 168 hours. BNP (last 3 results) No results for input(s): PROBNP in the last 8760 hours. HbA1C: No results for input(s): HGBA1C in the last 72 hours. CBG: No results for input(s): GLUCAP in the last 168 hours. Lipid Profile: No results for input(s): CHOL, HDL, LDLCALC, TRIG, CHOLHDL, LDLDIRECT in the last 72 hours. Thyroid Function Tests: Recent Labs    12/24/18 1912  TSH 1.004   Anemia Panel: Recent Labs    12/24/18 1907  VITAMINB12 1,114*  FOLATE 40.6  FERRITIN 8*  TIBC 459*  IRON 16*  RETICCTPCT 2.2   Urine analysis:    Component Value Date/Time   COLORURINE YELLOW 12/24/2018 1303   APPEARANCEUR HAZY (A) 12/24/2018 1303   LABSPEC 1.027 12/24/2018 1303   PHURINE 5.0 12/24/2018 1303   GLUCOSEU NEGATIVE 12/24/2018 1303   HGBUR NEGATIVE 12/24/2018 1303   BILIRUBINUR NEGATIVE 12/24/2018 1303   KETONESUR NEGATIVE 12/24/2018 1303   PROTEINUR 30 (A) 12/24/2018 1303   UROBILINOGEN 0.2 06/18/2014 1950   NITRITE POSITIVE (A) 12/24/2018 1303   LEUKOCYTESUR MODERATE (A) 12/24/2018 1303     Glorianna Gott M.D. [facility] 12/27/2018, 3:18 PM  Pager: [phone] Between 7am to 7pm - call Pager - [phone]  After 7pm go to [email or web address] - password [email or web address]  Call night coverage person covering after 7pm

## 2018-12-27 NOTE — TOC Transition Note (Signed)
Transition of Care [facility]) - CM/SW Discharge Note   Patient Details  Name: Jacqueline [name] MRN: [id number] Date of Birth: 1928/12/25  [facility]) CM/SW Contact:  Wende Neighbors, LCSW Phone Number: 12/27/2018, 1:04 PM   Clinical Narrative:   [facility]. Patient has all of her DMEs at bedside and RN is to educate family on oxygen usage    Final next level of care: Home w Home Health Services Barriers to Discharge: Continued Medical Work up   Patient Goals and CMS Choice   CMS Medicare.gov Compare Post Acute Care list provided to:: Other (Comment Required) Choice offered to / list presented to : Adult Children  Discharge Placement                       Discharge Plan and Services In-house Referral: Clinical Social Work   Post Acute Care Choice: Home Health          DME Arranged: Oxygen, 3-N-1 DME Agency: [employer] Date DME Agency Contacted: 12/27/18 Time DME Agency Contacted: 38 Representative spoke with at DME Agency: [place]: PT, OT [place] Agency: [place] ([place]) Date [place]: 12/27/18 Time [place]: 1022 Representative spoke with at [place]: [place] ([place]) Interventions     Readmission Risk Interventions No flowsheet data found.

## 2018-12-27 NOTE — TOC Initial Note (Signed)
Transition of Care [facility]) - Initial/Assessment Note    Patient Details  Name: Jacqueline [name] MRN: [id number] Date of Birth: Dec 18, 1928  [facility] of [facility]) CM/SW Contact:    Wende Neighbors, LCSW Phone Number: 12/27/2018, 10:25 AM  Clinical Narrative:     CSW spoke with patients son Keenan Bachelor via phone. Keenan Bachelor stated that him and his siblings have [employer] that come into the home for 3 hours a day, 6 days a week. Keenan Bachelor requested that CSW contact his brother [name] since he lives with patient.  CSW spoke with [name] via phone and asked if he would be agreeable with HHPT and OT in the home. [name] stated he is agreeable since patient has had [place] before in the past. CSW made [name] aware that patient will need oxygen at discharge  CSW contacted [employer] to order patients oxygen and 3 in 1. CSW also contacted [facility] to see if they will be able to follow patient for [facility] needs. Santiago Glad from [facility] stated she will reach back out to [employer] if company can take patient         Expected Discharge Plan: [place] Barriers to Discharge: Continued Medical Work up   Patient Goals and CMS Choice   CMS Medicare.gov Compare Post Acute Care list provided to:: Other (Comment Required) Choice offered to / list presented to : Adult Children  Expected Discharge Plan and Services Expected Discharge Plan: [place] In-house Referral: Clinical Social Work   Post Acute Care Choice: [place] arrangements for the past 2 months: [place] Expected Discharge Date: 12/28/18               DME Arranged: Oxygen, 3-N-1 DME Agency: [facility] Date DME Agency Contacted: 12/27/18 Time DME Agency Contacted: 2 Representative spoke with at DME Agency: [facility] Arranged: PT, OT [place] Agency: [place] ([place]) Date [place]: 12/27/18 Time [place]: 1022 Representative spoke with at [place]: Santiago Glad  Prior Living  Arrangements/Services Living arrangements for the past 2 months: [place] with:: Self, Adult Children Patient language and need for interpreter reviewed:: Yes Do you feel safe going back to the place where you live?: Yes      Need for Family Participation in Patient Care: Yes (Comment) Care giver support system in place?: Yes (comment) Current home services: Other (comment)(family has comfort keepers for patient) Criminal Activity/Legal Involvement Pertinent to Current Situation/Hospitalization: No - Comment as needed  Activities of Daily Living Home Assistive Devices/Equipment: Walker (specify type) ADL Screening (condition at time of admission) Patient's cognitive ability adequate to safely complete daily activities?: Yes Is the patient deaf or have difficulty hearing?: No Does the patient have difficulty seeing, even when wearing glasses/contacts?: No Does the patient have difficulty concentrating, remembering, or making decisions?: No Patient able to express need for assistance with ADLs?: Yes Does the patient have difficulty dressing or bathing?: Yes Independently performs ADLs?: No Communication: Appropriate for developmental age Dressing (OT): Needs assistance Is this a change from baseline?: Pre-admission baseline Grooming: Needs assistance Is this a change from baseline?: Pre-admission baseline Feeding: Independent Bathing: Needs assistance Is this a change from baseline?: Pre-admission baseline Toileting: Needs assistance Is this a change from baseline?: Pre-admission baseline In/Out Bed: Needs assistance Is this a change from baseline?: Pre-admission baseline Walks in Home: Needs assistance(uses walker) Is this a change from baseline?: Pre-admission baseline Does the patient have difficulty walking or climbing stairs?: Yes  Weakness of Legs: Both Weakness of Arms/Hands: None  Permission Sought/Granted Permission sought to share information with :  Family Supports    Share Information with NAME: Drew Mitre     Permission granted to share info w Relationship: son  Permission granted to share info w Contact Information: [phone]  Emotional Assessment Appearance:: Appears stated age Attitude/Demeanor/Rapport: Unable to Assess Affect (typically observed): Unable to Assess Orientation: : Oriented to Self, Oriented to Place, Oriented to Situation Alcohol / Substance Use: Not Applicable Psych Involvement: No (comment)  Admission diagnosis:  Dyspnea, unspecified type [R06.00] Patient Active Problem List   Diagnosis Date Noted  . Acute hypoxemic respiratory failure ([place]) 12/24/2018  . Acute on chronic diastolic CHF (congestive heart failure) ([place]) 12/24/2018  . Dyspnea 12/24/2018  . Chronic knee pain 12/24/2018  . History of multiple cerebrovascular accidents (CVAs) 12/24/2018  . GERD (gastroesophageal reflux disease) 11/04/2017  . Altered mental status 11/03/2017  . CVA (cerebral vascular accident) ([place]) 10/25/2017  . Benign essential HTN   . History of TIA (transient ischemic attack)   . TIA (transient ischemic attack) 10/24/2017  . Cerebral infarction due to embolism of right middle cerebral artery ([place])   . HLD (hyperlipidemia)   . Acute ischemic right MCA stroke ([place]) 06/18/2014  . Facial droop   . Displaced fracture of shaft of left humerus 04/03/2014  . Right arm fracture 04/01/2014  . Displaced spiral fracture of shaft of humerus   . Peripheral arterial disease ([place]) 10/03/2013  . Chronic diastolic CHF (congestive heart failure) ([place]) 08/08/2013  . Chronic venous insufficiency 04/02/2013  . Hypokalemia 01/13/2013  . PAF (paroxysmal atrial fibrillation) ([place]) 10/03/2012  . CAD (coronary artery disease) of artery bypass graft 04/05/2012  . Atrial fibrillation with RVR ([place]) 03/13/2012  . Need for prophylactic vaccination and inoculation against influenza 02/14/2012  . ABDOMINAL WALL HERNIA 01/21/2010  .  PARESTHESIA 05/04/2009  . SKIN LESION 01/28/2009  . Nocturia 01/28/2009  . DYSPNEA 01/01/2009  . CVA (cerebral infarction) 11/17/2008  . ALLERGY 10/27/2008  . PAROXYSMAL NOCTURNAL DYSPNEA 10/03/2008  . Osteoarthrosis, unspecified whether generalized or localized, involving lower leg 09/03/2008  . RESTRICTIVE LUNG DISEASE 05/26/2008  . Symptomatic anemia 01/08/2008  . UNS ADVRS EFF OTH RX MEDICINAL&BIOLOGICAL SBSTNC 11/09/2007  . HEMATOCHEZIA 07/18/2007  . PARANOIA 06/01/2007  . Anxiety 01/23/2007  . Hypertensive heart disease with CHF ([place]) 01/23/2007  . GI BLEEDING 01/23/2007  . Osteoarthritis 01/23/2007  . DEGENERATIVE DISC DISEASE, LUMBAR SPINE 01/23/2007  . History of peptic ulcer disease 01/23/2007  . Personal history of other diseases of digestive system 01/23/2007   PCP:  Lajean Manes, MD Pharmacy:   [place], [place] [address] Phone: [phone] Fax: [phone]     Social Determinants of Health (SDOH) Interventions    Readmission Risk Interventions No flowsheet data found.

## 2018-12-28 NOTE — Consult Note (Signed)
Jacqueline [name] is an 85 y.o. female.    Chief Complaint: right knee pain  HPI: 85 y/o female admitted for shortness of breath and decreased 02 sats. Pt has chronic history of right knee pain treated with cortisone injections in the office. Pt would like an injection for pain relief today prior to discharge. Denies any recent falls or injuries. Denies any numbness or tingling distally bilaterally  PCP:  Lajean Manes, MD  PMH: Past Medical History:  Diagnosis Date  . Allergy, unspecified not elsewhere classified   . Anemia, unspecified   . Anxiety state, unspecified   . Atrial fibrillation with rapid ventricular response ([place]) 03/13/12  . Blood in stool   . Complication of anesthesia    "hard to wake me up after colonoscopy" (03/13/12)  . Degeneration of lumbar or lumbosacral intervertebral disc   . GERD (gastroesophageal reflux disease)   . H/O hiatal hernia   . Hemorrhage of gastrointestinal tract, unspecified   . History of IBS   . Osteoarthrosis, unspecified whether generalized or localized, lower leg   . Other diseases of lung, not elsewhere classified   . Other dyspnea and respiratory abnormality   . Personal history of other diseases of digestive system   . Personal history of peptic ulcer disease   . Unspecified adverse effect of other drug, medicinal and biological substance(995.29)   . Unspecified cerebral artery occlusion with cerebral infarction 10/2008   "mini stroke" denies residual (03/13/12)  . Unspecified essential hypertension   . Unspecified paranoid state     PSes:  Allergies  Allergen Reactions  . Metoclopramide Hcl Other (See Comments)     Tremors  . Penicillins Itching    Has patient had a PCN reaction causing immediate rash, facial/tongue/throat swelling, SOB or lightheadedness with hypotension: Yes Has patient had a PCN reaction causing severe rash involving mucus membranes or skin necrosis: Yes Has patient had a PCN reaction that required  hospitalization:No Has patient had a PCN reaction occurring within the last 10 years: No If all of the above answers are "NO", then may proceed with Cephalosporin use.   . Aspirin Other (See Comments)    "gave me stomach aches; made me bleed" (03/13/12)  . Zetia [Ezetimibe] Other (See Comments)    Stomach and back pain    Medications: Current Facility-Administered Medications  Medication Dose Route Frequency Provider Last Rate Last Dose  . 0.9 %  sodium chloride infusion  250 mL Intravenous PRN Roney Jaffe, MD      . acetaminophen (TYLENOL) tablet 650 mg  650 mg Oral Q6H PRN Roney Jaffe, MD   650 mg at 12/27/18 2046   Or  . acetaminophen (TYLENOL) suppository 650 mg  650 mg Rectal Q6H PRN Roney Jaffe, MD      . ALPRAZolam Duanne Moron) tablet 0.125 mg  0.125 mg Oral BID PRN Roney Jaffe, MD   0.125 mg at 12/27/18 2046  . apixaban (ELIQUIS) tablet 5 mg  5 mg Oral BID Rai, Ripudeep K, MD   5 mg at 12/27/18 2046  . bisacodyl (DULCOLAX) suppository 10 mg  10 mg Rectal Daily PRN Roney Jaffe, MD   10 mg at 12/26/18 1157  . diltiazem (CARDIZEM CD) 24 hr capsule 180 mg  180 mg Oral BID Rai, Ripudeep K, MD   180 mg at 12/27/18 2047  . furosemide (LASIX) injection 20 mg  20 mg Intravenous Q12H Roney Jaffe, MD   20 mg at 12/27/18 2047  . iron polysaccharides (NIFEREX) capsule 150  mg  150 mg Oral Daily Rai, Ripudeep K, MD   150 mg at 12/27/18 0857  . metoprolol tartrate (LOPRESSOR) tablet 25 mg  25 mg Oral BID Roney Jaffe, MD   25 mg at 12/27/18 2046  . ondansetron (ZOFRAN) tablet 4 mg  4 mg Oral Q6H PRN Roney Jaffe, MD       Or  . ondansetron [facility]) injection 4 mg  4 mg Intravenous Q6H PRN Roney Jaffe, MD      . pantoprazole (PROTONIX) EC tablet 40 mg  40 mg Oral Daily Roney Jaffe, MD   40 mg at 12/27/18 0857  . polyethylene glycol (MIRALAX / GLYCOLAX) packet 17 g  17 g Oral Daily PRN Roney Jaffe, MD   17 g at 12/25/18 1807  . polyvinyl alcohol (LIQUIFILM  TEARS) 1.4 % ophthalmic solution 1 drop  1 drop Both Eyes QID PRN Roney Jaffe, MD      . senna [facility]) tablet 8.6 mg  1 tablet Oral BID Roney Jaffe, MD   8.6 mg at 12/27/18 2046  . sodium chloride flush (NS) 0.9 % injection 3 mL  3 mL Intravenous Q12H Roney Jaffe, MD   3 mL at 12/27/18 2037  . sodium chloride flush (NS) 0.9 % injection 3 mL  3 mL Intravenous PRN Roney Jaffe, MD      . sodium phosphate (FLEET) 7-19 GM/118ML enema 1 enema  1 enema Rectal Daily PRN Roney Jaffe, MD        Results for orders placed or performed during the hospital encounter of 12/24/18 (from the past 48 hour(s))  Occult blood card to lab, stool RN will collect     Status: None   Collection Time: 12/26/18  1:14 PM  Result Value Ref Range   Fecal Occult Bld NEGATIVE NEGATIVE    Comment: Performed at [facility], [place] [address], [place], [place] [id number]  Basic metabolic panel     Status: Abnormal   Collection Time: 12/27/18  3:45 AM  Result Value Ref Range   Sodium 138 135 - 145 mmol/L   Potassium 4.2 3.5 - 5.1 mmol/L   Chloride 98 98 - 111 mmol/L   CO2 29 22 - 32 mmol/L   Glucose, Bld 114 (H) 70 - 99 mg/dL   BUN 16 8 - 23 mg/dL   Creatinine, Ser 0.96 0.44 - 1.00 mg/dL   Calcium 9.1 8.9 - 10.3 mg/dL   GFR calc non Af Amer 53 (L) >60 mL/min   GFR calc Af Amer >60 >60 mL/min   Anion gap 11 5 - 15    Comment: Performed at [facility], [place] [address] [place], [place] [address]  CBC     Status: Abnormal   Collection Time: 12/27/18  3:45 AM  Result Value Ref Range   WBC 6.4 4.0 - 10.5 K/uL   RBC 3.97 3.87 - 5.11 MIL/uL   Hemoglobin 9.5 (L) 12.0 - 15.0 g/dL   HCT 34.5 (L) 36.0 - 46.0 %   MCV 86.9 80.0 - 100.0 fL   MCH 23.9 (L) 26.0 - 34.0 pg   MCHC 27.5 (L) 30.0 - 36.0 g/dL   RDW 16.5 (H) 11.5 - 15.5 %   Platelets 175 150 - 400 K/uL   nRBC 0.3 (H) 0.0 - 0.2 %    Comment: Performed at [facility], [place] [address]  [place], [place] [address]   Dg Knee 1-2 Views Right  Result Date: 12/27/2018 CLINICAL DATA:  Right knee pain. EXAM: RIGHT KNEE - 1-2 VIEW COMPARISON:  02/13/2017. FINDINGS: Severe tricompartment degenerative change, particularly prominent about the lateral compartment. Loose bodies most likely present. No acute bony abnormality identified. Peripheral vascular calcification. IMPRESSION: 1. Severe tricompartment degenerative change, most prominent about the lateral compartment. Loose bodies most likely present. No acute bony abnormality. 2.  Peripheral vascular disease. Electronically Signed   By: Marcello Moores  Register   On: 12/27/2018 11:08    ROS: ROS Pain with rom of right knee Pain with ambulation  Physical Exam: Alert and appropriate 85 y/o female in no acute distress Right knee crepitus with rom Mild medial and lateral joint line tenderness nv intact distally No effusion Currently kept in flexion No rashes or edema distally  Assessment/Plan Assessment: right knee pain from chronic osteoarthritis  Plan: Recommend cortisone injection for pain relief today Pt in agreement F/u prn in the office for any further/worsening symptoms Weight bearing as tolerated  Procedure: after sterile prep and consent 3/3/1 of marcaine, lidocaine, kenalog injected into right knee laterally. Pt tolerated the procedure well  Kathrynn Speed, [place] is now [employer] [facility] [address], [place], [place], [place] [address] Phone: [phone] [email or web address] Facebook  [employer]

## 2018-12-28 NOTE — Discharge Summary (Signed)
Physician Discharge Summary   Patient ID: Jacqueline [name] MRN: [id number] DOB/AGE: 10/29/1928 85 y.o.  Admit date: 12/24/2018 Discharge date: 12/28/2018  Primary Care Physician:  Lajean Manes, MD   Recommendations for Outpatient Follow-up:  1. Follow up with PCP in 1-2 weeks 2. Please follow be met for renal function and potassium, patient started on Lasix 40 mg daily  Home Health: Home health PT OT, Equipment/Devices: DME 3 n 1, DME O2  SATURATION QUALIFICATIONS: Patient Saturations on Room Air at Rest = 85%  Patient Saturations on Room Air while Ambulating = 85%  Patient Saturations on 2 Liters of oxygen while Ambulating = 95%   Discharge Condition: stable  CODE STATUS: FULL   Diet recommendation: Heart healthy diet   Discharge Diagnoses:    . Acute hypoxemic respiratory failure ([place]) . Acute on chronic diastolic CHF (congestive heart failure) ([place]) . Symptomatic anemia . Dementia . Chronic knee pain . Benign essential HTN . PAF (paroxysmal atrial fibrillation) (H[facility]) Acute on chronic right knee pain due to severe osteoarthritis  Consults: Orthopedics   Allergies:   Allergies  Allergen Reactions  . Metoclopramide Hcl Other (See Comments)     Tremors  . Penicillins Itching    Has patient had a PCN reaction causing immediate rash, facial/tongue/throat swelling, SOB or lightheadedness with hypotension: Yes Has patient had a PCN reaction causing severe rash involving mucus membranes or skin necrosis: Yes Has patient had a PCN reaction that required hospitalization:No Has patient had a PCN reaction occurring within the last 10 years: No If all of the above answers are "NO", then may proceed with Cephalosporin use.   . Aspirin Other (See Comments)    "gave me stomach aches; made me bleed" (03/13/12)  . Zetia [Ezetimibe] Other (See Comments)    Stomach and back pain     DISCHARGE MEDICATIONS: Allergies as of 12/28/2018      Reactions   Metoclopramide Hcl  Other (See Comments)    Tremors   Penicillins Itching   Has patient had a PCN reaction causing immediate rash, facial/tongue/throat swelling, SOB or lightheadedness with hypotension: Yes Has patient had a PCN reaction causing severe rash involving mucus membranes or skin necrosis: Yes Has patient had a PCN reaction that required hospitalization:No Has patient had a PCN reaction occurring within the last 10 years: No If all of the above answers are "NO", then may proceed with Cephalosporin use.   Aspirin Other (See Comments)   "gave me stomach aches; made me bleed" (03/13/12)   Zetia [ezetimibe] Other (See Comments)   Stomach and back pain      Medication List    TAKE these medications   ALPRAZolam 0.25 MG tablet Commonly known as: XANAX Take 0.5 tablets (0.125 mg total) by mouth 2 (two) times daily as needed for anxiety.   apixaban 5 MG Tabs tablet Commonly known as: ELIQUIS Take 1 tablet (5 mg total) by mouth 2 (two) times daily.   bisacodyl 10 MG suppository Commonly known as: DULCOLAX Place 10 mg rectally daily as needed (constipation not relieved by MOM).   diltiazem 180 MG 24 hr capsule Commonly known as: DILACOR XR Take 180 mg by mouth 2 (two) times daily.   FLEET ENEMA RE Place 1 enema rectally daily as needed (constipation not relieved by bisacodyl suppository).   furosemide 40 MG tablet Commonly known as: Lasix Take 1 tablet (40 mg total) by mouth daily.   iron polysaccharides 150 MG capsule Commonly known as: NIFEREX Take 1 capsule (150  mg total) by mouth daily.   metoprolol tartrate 25 MG tablet Commonly known as: LOPRESSOR Take 1 tablet (25 mg total) by mouth 2 (two) times daily.   MILK OF MAGNESIA PO Take 30 mLs by mouth daily as needed (if no BM in 3 days).   multivitamin with minerals Tabs tablet Take 1 tablet by mouth daily. Centrum Silver   pantoprazole 40 MG tablet Commonly known as: PROTONIX TAKE 1 TABLET EVERY MORNING What changed:   how  much to take  how to take this  when to take this  additional instructions   senna 8.6 MG Tabs tablet Commonly known as: SENOKOT Take 1 tablet by mouth 2 (two) times daily.   Systane Overnight Therapy 0.3 % Gel ophthalmic ointment Generic drug: hypromellose Place 1 application into both eyes 4 (four) times daily as needed for dry eyes.   Vitamin D3 125 MCG (5000 UT) Caps Take 5,000 Units by mouth daily.            Durable Medical Equipment  (From admission, onward)         Start     Ordered   12/27/18 1129  For home use only DME oxygen  Once    Question Answer Comment  Length of Need 12 Months   Mode or (Route) Nasal cannula   Liters per Minute 2   Frequency Continuous (stationary and portable oxygen unit needed)   Oxygen conserving device Yes   Oxygen delivery system Gas      12/27/18 1128   12/27/18 0916  For home use only DME 3 n 1  Once     12/27/18 0916           Brief H and P: For complete details please refer to admission H and P, but in brief *Patient is an 85 y.o.femalewith hx ofHTN, CVA's, OA, PAF , chron diast HF who presented to ED w/ SOB and dyspnea on exertion. EMS found sats in high 70's improved to 96% on 4L [place]. Also she has chronic R knee pain. In ED CXR was negative w/ some atx, BNP was high at 1200 and CTA chest was negative for PE. Hb is 7.8 (usual 14, lowest prior 8.6). Took O2 off in ED and sat's dropped to low 80's after 3- 4 minutes. Patient denied prod cough, fever, chills, CP, abd pain, n/v/d. Has chronic R knee pain. Vague historian. Hx of diast CHF.  Patient states has episodic rectal bleeding every once in a while and her doctors "build it back up". Says it has been happening for "years". Has not had any recent rectal bleeding, black stools , nosebleeds or other bleeding.  Hospital Course:   Acute hypoxemic respiratory failure ([place]), multifactorial likely due to acute on chronic diastolic CHF,  anemia -Patient was placed  on IV Lasix 20 mg twice daily.  At home patient was on Lasix 20 mg daily, however not taking. -Patient was placed on strict I's and O's and daily weights, negative balance of 3.2 L at the time of discharge.  Weight 166.6lbs at the time of admission down to 157.9 lbs at discharge -2D echo 8/25 showed EF of 60 to 65% -Home O2 evaluation done, requires 2 L O2 -Patient discharged on Lasix 40 mg daily, please check bmet in 1 week for renal function and potassium.  If doing well, may decrease Lasix back to 20 mg daily and encourage patient to be compliant with Lasix.    Symptomatic anemia -Iron deficiency, hemoglobin  was 7.8.  Per patient, has had rectal bleeding issues, chronically with anticoagulation and her primary physician has tried many different kind of anticoagulants. -Denies any recent GI bleed, anemia profile consistent with iron deficiency anemia, received 1 dose of IV Feraheme on 8/25 -To new iron replacement - B12 and folate normal, FOBT negative    PAF (paroxysmal atrial fibrillation) (HCC) -Rate controlled, currently Eliquis on hold however given history of multiple CVAs, still functional, Eliquis was restarted after discussion with patient's daughter. -Rate controlled, continue beta-blocker, diltiazem    Benign essential HTN -BP stable, continue Lasix, diltiazem, metoprolol  Acute on chronic knee pain -Currently stable, gets cortisone shots outpatient. -Patient complaining of significant pain in her chronic knee osteoarthritis, states she cannot walk during PT due to pain and has been periodically getting cortisone shots from Dr. Veverly Fells -Stat x-ray showed severe tricompartment degenerative changes, no acute issues -Orthopedics consulted, patient was given  cortisone injection prior to discharge    History of multiple cerebrovascular accidents (CVAs) -Prior history of CVAs, has paroxysmal atrial fibrillation, restart Eliquis  Dementia Mild, continue donepezil   Day of  Discharge S: Right knee feels a lot better, received cortisone injection early in the morning today by orthopedics.  Overall feels better, wants to go home.  BP (!) 153/71 (BP Location: Left Arm)   Pulse 91   Temp 98.3 F (36.8 C) (Oral)   Resp 18   Ht 5' (1.524 m)   Wt 71.3 kg   SpO2 93%   BMI 30.70 kg/m   Physical Exam: General: Alert and awake oriented x3 not in any acute distress. HEENT: anicteric sclera, pupils reactive to light and accommodation CVS: S1-S2 clear no murmur rubs or gallops Chest: clear to auscultation bilaterally, no wheezing rales or rhonchi Abdomen: soft nontender, nondistended, normal bowel sounds Extremities: no cyanosis, clubbing or edema noted bilaterally Neuro: No new deficits   The results of significant diagnostics from this hospitalization (including imaging, microbiology, ancillary and laboratory) are listed below for reference.      Procedures/Studies:  Dg Knee 1-2 Views Right  Result Date: 12/27/2018 CLINICAL DATA:  Right knee pain. EXAM: RIGHT KNEE - 1-2 VIEW COMPARISON:  02/13/2017. FINDINGS: Severe tricompartment degenerative change, particularly prominent about the lateral compartment. Loose bodies most likely present. No acute bony abnormality identified. Peripheral vascular calcification. IMPRESSION: 1. Severe tricompartment degenerative change, most prominent about the lateral compartment. Loose bodies most likely present. No acute bony abnormality. 2.  Peripheral vascular disease. Electronically Signed   By: [place]   On: 12/27/2018 11:08   Ct Angio Chest Pe W And/or Wo Contrast  Result Date: 12/24/2018 CLINICAL DATA:  Shortness of breath EXAM: CT ANGIOGRAPHY CHEST WITH CONTRAST TECHNIQUE: Multidetector CT imaging of the chest was performed using the standard protocol during bolus administration of intravenous contrast. Multiplanar CT image reconstructions and MIPs were obtained to evaluate the vascular anatomy. CONTRAST:  144m  OMNIPAQUE IOHEXOL 350 MG/ML SOLN COMPARISON:  Portable chest x-ray earlier today. FINDINGS: Cardiovascular: Cardiomegaly. Tortuous aorta. The mid descending thoracic aorta is mildly aneurysmal measuring up to 4.2 cm. Aortic arch measures 3.3 cm. Ascending thoracic aorta measures 3.7 cm maximally. No filling defects in the pulmonary arteries to suggest pulmonary emboli. Mediastinum/Nodes: Large hiatal hernia. No mediastinal, hilar, or axillary adenopathy. Lungs/Pleura: Small right pleural effusion. Atelectasis in the lower lobes bilaterally. No overt edema. Upper Abdomen: Imaging into the upper abdomen shows no acute findings. Musculoskeletal: Chest wall soft tissues are unremarkable. No acute bony abnormality.  Review of the MIP images confirms the above findings. IMPRESSION: Cardiomegaly. Tortuous, atherosclerotic aorta. Mild aneurysmal dilatation of the descending thoracic aorta measuring up to 4.2 cm in the mid descending thoracic aorta. Small right pleural effusion. Large hiatal hernia. Bilateral lower lobe atelectasis. Electronically Signed   By: Rolm Baptise M.D.   On: 12/24/2018 18:34   Dg Chest Port 1 View  Result Date: 12/24/2018 CLINICAL DATA:  Dyspnea. EXAM: PORTABLE CHEST 1 VIEW COMPARISON:  Radiograph of November 03, 2017. FINDINGS: Stable cardiomegaly. Atherosclerosis of thoracic aorta is noted. No pneumothorax or pleural effusion is noted. Minimal bibasilar subsegmental atelectasis is noted. Severe degenerative changes seen involving both glenohumeral joints. IMPRESSION: Minimal bibasilar subsegmental atelectasis. Aortic Atherosclerosis (ICD10-I70.0). Electronically Signed   By: Marijo Conception M.D.   On: 12/24/2018 14:28       LAB RESULTS: Basic Metabolic Panel: Recent Labs  Lab 12/26/18 0442 12/27/18 0345  NA 141 138  K 4.3 4.2  CL 103 98  CO2 30 29  GLUCOSE 105* 114*  BUN 16 16  CREATININE 0.92 0.96  CALCIUM 8.9 9.1   Liver Function Tests: Recent Labs  Lab 12/24/18 1330  AST  24  ALT 38  ALKPHOS 88  BILITOT 0.6  PROT 6.4*  ALBUMIN 3.5   No results for input(s): LIPASE, AMYLASE in the last 168 hours. No results for input(s): AMMONIA in the last 168 hours. CBC: Recent Labs  Lab 12/24/18 1330  12/26/18 0442 12/27/18 0345  WBC 5.4   < > 6.0 6.4  NEUTROABS 4.2  --   --   --   HGB 7.8*   < > 8.6* 9.5*  HCT 28.5*   < > 31.0* 34.5*  MCV 89.6   < > 88.3 86.9  PLT 163   < > 156 175   < > = values in this interval not displayed.   Cardiac Enzymes: No results for input(s): CKTOTAL, CKMB, CKMBINDEX, TROPONINI in the last 168 hours. BNP: Invalid input(s): POCBNP CBG: No results for input(s): GLUCAP in the last 168 hours.    Disposition and Follow-up: Discharge Instructions    Diet - low sodium heart healthy   Complete by: As directed    Increase activity slowly   Complete by: As directed        DISPOSITION: Home with home health   DISCHARGE FOLLOW-UP Follow-up Information    [place], Hal, MD. Schedule an appointment as soon as possible for a visit in 1 week(s).   Specialty: Internal Medicine Why: please get BMET for potassium and kidney function Contact information: [address] [place] [address] [phone]        Netta Cedars, MD. Schedule an appointment as soon as possible for a visit in 2 week(s).   Specialty: Orthopedic Surgery Contact information: [address] [place] [address] [phone]            Time coordinating discharge:  35 minutes  Signed:   Estill Cotta M.D. [facility] 12/28/2018, 12:57 PM

## 2018-12-28 NOTE — Progress Notes (Signed)
Occupational Therapy Treatment Patient Details Name: Jacqueline [name] MRN: [id number] DOB: [birth year] Today's Date: 12/28/2018    History of present illness 85 yo female admitted with acute respiratory failure, R knee pain affecting pt's abilty to ambulate. Hx of HTN, CVA, OA, PAF, HF, chronic R knee pain   OT comments  Pt progressing toward stated goals, noted cognitive deficits this session with sequencing and problem solving table top BADL tasks. Pt sitting EOB with min/mod A to engage in BADL. Pt brushed teeth with min A and shaved chin with mod A. She had difficulty finding the openings for soap/toothpaste without hand over hand cues. Continue to recommend d/c plan below. Will continue to follow.    Follow Up Recommendations  Home health OT;Supervision/Assistance - 24 hour    Equipment Recommendations  None recommended by OT    Recommendations for Other Services      Precautions / Restrictions Precautions Precautions: Fall Restrictions Weight Bearing Restrictions: No       Mobility Bed Mobility Overal bed mobility: Needs Assistance Bed Mobility: Supine to Sit;Sit to Supine     Supine to sit: HOB elevated;Min assist Sit to supine: Mod assist   General bed mobility comments: assit for LE translation to EOB, bed bad needed to scoot hips  Transfers                      Balance Overall balance assessment: Needs assistance Sitting-balance support: Single extremity supported Sitting balance-Leahy Scale: Good Sitting balance - Comments: sat EOB to brush teeth static sitting                                   ADL either performed or assessed with clinical judgement   ADL Overall ADL's : Needs assistance/impaired     Grooming: Minimal assistance;Sitting;Cueing for sequencing Grooming Details (indicate cue type and reason): pt having sequencing deficits                               General ADL Comments: sat EOB to engage in BADL      Vision Patient Visual Report: No change from baseline     Perception     Praxis      Cognition Arousal/Alertness: Awake/alert Behavior During Therapy: WFL for tasks assessed/performed Overall Cognitive Status: No family/caregiver present to determine baseline cognitive functioning Area of Impairment: Memory;Problem solving                     Memory: Decreased short-term memory       Problem Solving: Requires tactile cues;Requires verbal cues;Difficulty sequencing General Comments: pt presenting with decreased ability to sequencing basic BADL tasks, reaching for OT name badge thinking it is tooth brush        Exercises     Shoulder Instructions       General Comments      Pertinent Vitals/ Pain       Pain Assessment: No/denies pain  Home Living                                          Prior Functioning/Environment              Frequency  Min 2X/week  Progress Toward Goals  OT Goals(current goals can now be found in the care plan section)  Progress towards OT goals: Progressing toward goals  Acute Rehab OT Goals Patient Stated Goal: go home today OT Goal Formulation: With patient Time For Goal Achievement: 01/01/19  Plan      Co-evaluation                 AM-PAC OT "6 Clicks" Daily Activity     Outcome Measure   Help from another person eating meals?: A Little Help from another person taking care of personal grooming?: A Little Help from another person toileting, which includes using toliet, bedpan, or urinal?: A Lot Help from another person bathing (including washing, rinsing, drying)?: A Lot Help from another person to put on and taking off regular upper body clothing?: A Little Help from another person to put on and taking off regular lower body clothing?: A Lot 6 Click Score: 15    End of Session Equipment Utilized During Treatment: Gait belt  OT Visit Diagnosis: Unsteadiness on feet  (R26.81);Other abnormalities of gait and mobility (R26.89);Muscle weakness (generalized) (M62.81);History of falling (Z91.81)   Activity Tolerance Patient tolerated treatment well   Patient Left in bed;with call bell/phone within reach;with bed alarm set   Nurse Communication Mobility status        Time: 1100-1120 OT Time Calculation (min): 20 min  Charges: OT General Charges $OT Visit: 1 Visit OT Treatments $Self Care/Home Management : 8-22 mins  Zenovia Jarred, MSOT, OTR/L [place] OT/ Acute Relief OT [facility] Office: [phone]  Zenovia Jarred 12/28/2018, 11:46 AM

## 2018-12-29 LAB — CULTURE, BLOOD (ROUTINE X 2)
Culture: NO GROWTH
Culture: NO GROWTH
Special Requests: ADEQUATE
Special Requests: ADEQUATE

## 2019-01-08 DIAGNOSIS — D6869 Other thrombophilia: Secondary | ICD-10-CM | POA: Diagnosis not present

## 2019-01-08 DIAGNOSIS — I5032 Chronic diastolic (congestive) heart failure: Secondary | ICD-10-CM | POA: Diagnosis not present

## 2019-01-08 DIAGNOSIS — N183 Chronic kidney disease, stage 3 (moderate): Secondary | ICD-10-CM | POA: Diagnosis not present

## 2019-01-08 DIAGNOSIS — I482 Chronic atrial fibrillation, unspecified: Secondary | ICD-10-CM | POA: Diagnosis not present

## 2019-01-08 DIAGNOSIS — I129 Hypertensive chronic kidney disease with stage 1 through stage 4 chronic kidney disease, or unspecified chronic kidney disease: Secondary | ICD-10-CM | POA: Diagnosis not present

## 2019-08-05 ENCOUNTER — Other Ambulatory Visit: Payer: Self-pay

## 2019-08-05 ENCOUNTER — Emergency Department (HOSPITAL_COMMUNITY): Payer: Medicare Other

## 2019-08-05 ENCOUNTER — Inpatient Hospital Stay (HOSPITAL_COMMUNITY)
Admission: EM | Admit: 2019-08-05 | Discharge: 2019-08-07 | DRG: 189 | Disposition: A | Discharge status: Home / Self Care | Payer: Medicare Other | Attending: Internal Medicine | Admitting: Internal Medicine

## 2019-08-05 DIAGNOSIS — E785 Hyperlipidemia, unspecified: Secondary | ICD-10-CM | POA: Diagnosis present

## 2019-08-05 DIAGNOSIS — K449 Diaphragmatic hernia without obstruction or gangrene: Secondary | ICD-10-CM | POA: Diagnosis present

## 2019-08-05 DIAGNOSIS — Z880 Allergy status to penicillin: Secondary | ICD-10-CM

## 2019-08-05 DIAGNOSIS — I130 Hypertensive heart and chronic kidney disease with heart failure and stage 1 through stage 4 chronic kidney disease, or unspecified chronic kidney disease: Secondary | ICD-10-CM | POA: Diagnosis present

## 2019-08-05 DIAGNOSIS — K219 Gastro-esophageal reflux disease without esophagitis: Secondary | ICD-10-CM | POA: Diagnosis present

## 2019-08-05 DIAGNOSIS — F419 Anxiety disorder, unspecified: Secondary | ICD-10-CM | POA: Diagnosis present

## 2019-08-05 DIAGNOSIS — I2510 Atherosclerotic heart disease of native coronary artery without angina pectoris: Secondary | ICD-10-CM | POA: Diagnosis present

## 2019-08-05 DIAGNOSIS — R5383 Other fatigue: Secondary | ICD-10-CM | POA: Diagnosis not present

## 2019-08-05 DIAGNOSIS — R0902 Hypoxemia: Secondary | ICD-10-CM | POA: Diagnosis not present

## 2019-08-05 DIAGNOSIS — J9601 Acute respiratory failure with hypoxia: Secondary | ICD-10-CM | POA: Diagnosis not present

## 2019-08-05 DIAGNOSIS — K589 Irritable bowel syndrome without diarrhea: Secondary | ICD-10-CM | POA: Diagnosis present

## 2019-08-05 DIAGNOSIS — Z79899 Other long term (current) drug therapy: Secondary | ICD-10-CM

## 2019-08-05 DIAGNOSIS — G8929 Other chronic pain: Secondary | ICD-10-CM | POA: Diagnosis present

## 2019-08-05 DIAGNOSIS — I10 Essential (primary) hypertension: Secondary | ICD-10-CM | POA: Diagnosis present

## 2019-08-05 DIAGNOSIS — M1711 Unilateral primary osteoarthritis, right knee: Secondary | ICD-10-CM | POA: Diagnosis present

## 2019-08-05 DIAGNOSIS — R350 Frequency of micturition: Secondary | ICD-10-CM | POA: Diagnosis not present

## 2019-08-05 DIAGNOSIS — N183 Chronic kidney disease, stage 3 unspecified: Secondary | ICD-10-CM | POA: Diagnosis present

## 2019-08-05 DIAGNOSIS — R52 Pain, unspecified: Secondary | ICD-10-CM

## 2019-08-05 DIAGNOSIS — Z823 Family history of stroke: Secondary | ICD-10-CM

## 2019-08-05 DIAGNOSIS — Z7901 Long term (current) use of anticoagulants: Secondary | ICD-10-CM

## 2019-08-05 DIAGNOSIS — R531 Weakness: Secondary | ICD-10-CM

## 2019-08-05 DIAGNOSIS — J986 Disorders of diaphragm: Secondary | ICD-10-CM | POA: Diagnosis not present

## 2019-08-05 DIAGNOSIS — N3000 Acute cystitis without hematuria: Secondary | ICD-10-CM | POA: Diagnosis not present

## 2019-08-05 DIAGNOSIS — Z8673 Personal history of transient ischemic attack (TIA), and cerebral infarction without residual deficits: Secondary | ICD-10-CM

## 2019-08-05 DIAGNOSIS — R519 Headache, unspecified: Secondary | ICD-10-CM

## 2019-08-05 DIAGNOSIS — Z20822 Contact with and (suspected) exposure to covid-19: Secondary | ICD-10-CM | POA: Diagnosis not present

## 2019-08-05 DIAGNOSIS — S8991XA Unspecified injury of right lower leg, initial encounter: Secondary | ICD-10-CM | POA: Diagnosis not present

## 2019-08-05 DIAGNOSIS — N179 Acute kidney failure, unspecified: Secondary | ICD-10-CM | POA: Diagnosis present

## 2019-08-05 DIAGNOSIS — N390 Urinary tract infection, site not specified: Secondary | ICD-10-CM | POA: Diagnosis not present

## 2019-08-05 DIAGNOSIS — I5032 Chronic diastolic (congestive) heart failure: Secondary | ICD-10-CM | POA: Diagnosis not present

## 2019-08-05 DIAGNOSIS — D7589 Other specified diseases of blood and blood-forming organs: Secondary | ICD-10-CM | POA: Diagnosis present

## 2019-08-05 DIAGNOSIS — F411 Generalized anxiety disorder: Secondary | ICD-10-CM | POA: Diagnosis present

## 2019-08-05 DIAGNOSIS — M25551 Pain in right hip: Secondary | ICD-10-CM | POA: Diagnosis not present

## 2019-08-05 DIAGNOSIS — I480 Paroxysmal atrial fibrillation: Secondary | ICD-10-CM | POA: Diagnosis present

## 2019-08-05 DIAGNOSIS — Z888 Allergy status to other drugs, medicaments and biological substances status: Secondary | ICD-10-CM

## 2019-08-05 DIAGNOSIS — Z87891 Personal history of nicotine dependence: Secondary | ICD-10-CM

## 2019-08-05 DIAGNOSIS — E876 Hypokalemia: Secondary | ICD-10-CM | POA: Diagnosis not present

## 2019-08-05 DIAGNOSIS — Z886 Allergy status to analgesic agent status: Secondary | ICD-10-CM

## 2019-08-05 DIAGNOSIS — M25561 Pain in right knee: Secondary | ICD-10-CM | POA: Diagnosis not present

## 2019-08-05 DIAGNOSIS — I25810 Atherosclerosis of coronary artery bypass graft(s) without angina pectoris: Secondary | ICD-10-CM | POA: Diagnosis present

## 2019-08-05 DIAGNOSIS — Z8711 Personal history of peptic ulcer disease: Secondary | ICD-10-CM

## 2019-08-05 LAB — CBG MONITORING, ED: Glucose-Capillary: 111 mg/dL — ABNORMAL HIGH (ref 70–99)

## 2019-08-05 LAB — TSH
TSH: 1.78 u[IU]/mL (ref 0.350–4.500)
TSH: 1.945 u[IU]/mL (ref 0.350–4.500)

## 2019-08-05 LAB — CBC WITH DIFFERENTIAL/PLATELET
Abs Immature Granulocytes: 0.02 10*3/uL (ref 0.00–0.07)
Basophils Absolute: 0 10*3/uL (ref 0.0–0.1)
Basophils Relative: 0 %
Eosinophils Absolute: 0.1 10*3/uL (ref 0.0–0.5)
Eosinophils Relative: 1 %
HCT: 46.7 % — ABNORMAL HIGH (ref 36.0–46.0)
Hemoglobin: 15.3 g/dL — ABNORMAL HIGH (ref 12.0–15.0)
Immature Granulocytes: 0 %
Lymphocytes Relative: 17 %
Lymphs Abs: 1.2 10*3/uL (ref 0.7–4.0)
MCH: 33.1 pg (ref 26.0–34.0)
MCHC: 32.8 g/dL (ref 30.0–36.0)
MCV: 101.1 fL — ABNORMAL HIGH (ref 80.0–100.0)
Monocytes Absolute: 0.6 10*3/uL (ref 0.1–1.0)
Monocytes Relative: 9 %
Neutro Abs: 4.9 10*3/uL (ref 1.7–7.7)
Neutrophils Relative %: 73 %
Platelets: 190 10*3/uL (ref 150–400)
RBC: 4.62 MIL/uL (ref 3.87–5.11)
RDW: 14.4 % (ref 11.5–15.5)
WBC: 6.7 10*3/uL (ref 4.0–10.5)
nRBC: 0 % (ref 0.0–0.2)

## 2019-08-05 LAB — COMPREHENSIVE METABOLIC PANEL
ALT: 10 U/L (ref 0–44)
AST: 19 U/L (ref 15–41)
Albumin: 3.7 g/dL (ref 3.5–5.0)
Alkaline Phosphatase: 65 U/L (ref 38–126)
Anion gap: 11 (ref 5–15)
BUN: 27 mg/dL — ABNORMAL HIGH (ref 8–23)
CO2: 30 mmol/L (ref 22–32)
Calcium: 9.8 mg/dL (ref 8.9–10.3)
Chloride: 103 mmol/L (ref 98–111)
Creatinine, Ser: 1.13 mg/dL — ABNORMAL HIGH (ref 0.44–1.00)
GFR calc Af Amer: 50 mL/min — ABNORMAL LOW (ref >60)
GFR calc non Af Amer: 43 mL/min — ABNORMAL LOW (ref >60)
Glucose, Bld: 105 mg/dL — ABNORMAL HIGH (ref 70–99)
Potassium: 3.6 mmol/L (ref 3.5–5.1)
Sodium: 144 mmol/L (ref 135–145)
Total Bilirubin: 0.6 mg/dL (ref 0.3–1.2)
Total Protein: 6.5 g/dL (ref 6.5–8.1)

## 2019-08-05 LAB — URINALYSIS, ROUTINE W REFLEX MICROSCOPIC
Bilirubin Urine: NEGATIVE
Glucose, UA: NEGATIVE mg/dL
Hgb urine dipstick: NEGATIVE
Ketones, ur: NEGATIVE mg/dL
Nitrite: POSITIVE — AB
Protein, ur: NEGATIVE mg/dL
Specific Gravity, Urine: 1.017 (ref 1.005–1.030)
pH: 5 (ref 5.0–8.0)

## 2019-08-05 LAB — MAGNESIUM: Magnesium: 2.2 mg/dL (ref 1.7–2.4)

## 2019-08-05 LAB — VITAMIN B12: Vitamin B-12: 996 pg/mL — ABNORMAL HIGH (ref 180–914)

## 2019-08-05 LAB — SARS CORONAVIRUS 2 (TAT 6-24 HRS): SARS Coronavirus 2: NEGATIVE

## 2019-08-05 MED ORDER — ACETAMINOPHEN 650 MG RE SUPP: 650.0000 mg | Freq: Four times a day (QID) | RECTAL | Status: DC | PRN

## 2019-08-05 MED ORDER — ENOXAPARIN SODIUM 40 MG/0.4ML ~~LOC~~ SOLN
40.0000 mg | SUBCUTANEOUS | Status: DC
  Administered 2019-08-05: 40 mg via SUBCUTANEOUS
  Filled 2019-08-05: qty 0.4

## 2019-08-05 MED ORDER — SODIUM CHLORIDE 0.9 % IV SOLN
1.0000 g | Freq: Once | INTRAVENOUS | Status: AC
  Administered 2019-08-05: 1 g via INTRAVENOUS
  Filled 2019-08-05: qty 10

## 2019-08-05 MED ORDER — SODIUM CHLORIDE 0.9 % IV SOLN: INTRAVENOUS | Status: DC

## 2019-08-05 MED ORDER — ACETAMINOPHEN 325 MG PO TABS
650.0000 mg | ORAL_TABLET | Freq: Four times a day (QID) | ORAL | Status: DC | PRN
  Administered 2019-08-06: 650 mg via ORAL
  Filled 2019-08-05: qty 2

## 2019-08-05 MED ORDER — SODIUM CHLORIDE 0.9 % IV SOLN
1.0000 g | INTRAVENOUS | Status: DC
  Administered 2019-08-06: 1 g via INTRAVENOUS
  Filled 2019-08-05: qty 1
  Filled 2019-08-05: qty 10

## 2019-08-05 MED ORDER — ONDANSETRON HCL 4 MG/2ML IJ SOLN: 4.0000 mg | Freq: Four times a day (QID) | INTRAMUSCULAR | Status: DC | PRN

## 2019-08-05 MED ORDER — ONDANSETRON HCL 4 MG PO TABS: 4.0000 mg | ORAL_TABLET | Freq: Four times a day (QID) | ORAL | Status: DC | PRN

## 2019-08-05 NOTE — ED Notes (Signed)
One set of cultures obtained by Phlebotomist after RN attempted to draw cultures.

## 2019-08-05 NOTE — ED Notes (Signed)
Pt's O2 was at 85%-86% with a good pleth. Pt placed on 1L of O2. Pt's O2 moved up to 96%. [place] - RN informed.

## 2019-08-05 NOTE — ED Provider Notes (Signed)
[facility] EMERGENCY DEPARTMENT Provider Note   CSN: [id number] Arrival date & time: 08/05/19  1405     History Chief Complaint  Patient presents with  . Weakness    Jacqueline [name] is a 86 y.o. female.  The history is provided by the patient, medical records and a relative. No language interpreter was used.  Neurologic Problem This is a new problem. The current episode started more than 2 days ago. The problem occurs constantly. The problem has not changed since onset.Associated symptoms include headaches. Pertinent negatives include no chest pain, no abdominal pain and no shortness of breath. Nothing aggravates the symptoms. Nothing relieves the symptoms. She has tried nothing for the symptoms. The treatment provided no relief.       Past Medical History:  Diagnosis Date  . Allergy, unspecified not elsewhere classified   . Anemia, unspecified   . Anxiety state, unspecified   . Atrial fibrillation with rapid ventricular response ([place]) 03/13/12  . Blood in stool   . Complication of anesthesia    "hard to wake me up after colonoscopy" (03/13/12)  . Degeneration of lumbar or lumbosacral intervertebral disc   . GERD (gastroesophageal reflux disease)   . H/O hiatal hernia   . Hemorrhage of gastrointestinal tract, unspecified   . History of IBS   . Osteoarthrosis, unspecified whether generalized or localized, lower leg   . Other diseases of lung, not elsewhere classified   . Other dyspnea and respiratory abnormality   . Personal history of other diseases of digestive system   . Personal history of peptic ulcer disease   . Unspecified adverse effect of other drug, medicinal and biological substance(995.29)   . Unspecified cerebral artery occlusion with cerebral infarction 10/2008   "mini stroke" denies residual (03/13/12)  . Unspecified essential hypertension   . Unspecified paranoid state     Patient Active Problem List   Diagnosis Date Noted  . Acute  hypoxemic respiratory failure ([place]) 12/24/2018  . Acute on chronic diastolic CHF (congestive heart failure) ([place]) 12/24/2018  . Dyspnea 12/24/2018  . Chronic knee pain 12/24/2018  . History of multiple cerebrovascular accidents (CVAs) 12/24/2018  . GERD (gastroesophageal reflux disease) 11/04/2017  . Altered mental status 11/03/2017  . CVA (cerebral vascular accident) ([place]) 10/25/2017  . Benign essential HTN   . History of TIA (transient ischemic attack)   . TIA (transient ischemic attack) 10/24/2017  . Cerebral infarction due to embolism of right middle cerebral artery ([place])   . HLD (hyperlipidemia)   . Acute ischemic right MCA stroke ([place]) 06/18/2014  . Facial droop   . Displaced fracture of shaft of left humerus 04/03/2014  . Right arm fracture 04/01/2014  . Displaced spiral fracture of shaft of humerus   . Peripheral arterial disease ([place]) 10/03/2013  . Chronic diastolic CHF (congestive heart failure) ([place]) 08/08/2013  . Chronic venous insufficiency 04/02/2013  . Hypokalemia 01/13/2013  . PAF (paroxysmal atrial fibrillation) ([place]) 10/03/2012  . CAD (coronary artery disease) of artery bypass graft 04/05/2012  . Atrial fibrillation with RVR ([place]) 03/13/2012  . Need for prophylactic vaccination and inoculation against influenza 02/14/2012  . ABDOMINAL WALL HERNIA 01/21/2010  . PARESTHESIA 05/04/2009  . SKIN LESION 01/28/2009  . Nocturia 01/28/2009  . DYSPNEA 01/01/2009  . CVA (cerebral infarction) 11/17/2008  . ALLERGY 10/27/2008  . PAROXYSMAL NOCTURNAL DYSPNEA 10/03/2008  . Osteoarthrosis, unspecified whether generalized or localized, involving lower leg 09/03/2008  . RESTRICTIVE LUNG DISEASE 05/26/2008  . Symptomatic anemia 01/08/2008  .  UNS ADVRS EFF OTH RX MEDICINAL&BIOLOGICAL SBSTNC 11/09/2007  . HEMATOCHEZIA 07/18/2007  . PARANOIA 06/01/2007  . Anxiety 01/23/2007  . Hypertensive heart disease with CHF ([place]) 01/23/2007  . GI BLEEDING 01/23/2007  . Osteoarthritis  01/23/2007  . DEGENERATIVE DISC DISEASE, LUMBAR SPINE 01/23/2007  . History of peptic ulcer disease 01/23/2007  . Personal history of other diseases of digestive system 01/23/2007    Past Surgical History:  Procedure Laterality Date  . DILATION AND CURETTAGE OF UTERUS  1950's?  . ORIF HUMERUS FRACTURE Right 04/03/2014   Procedure: OPEN REDUCTION INTERNAL FIXATION (ORIF) RIGHT HUMERUS DISTAL FRACTURE;  Surgeon: Roseanne Kaufman, MD;  Location: [facility];  Service: Orthopedics;  Laterality: Right;  . PILONIDAL CYST / SINUS EXCISION  1957  . SHOULDER ARTHROSCOPY  ~ 2000   right     OB History   No obstetric history on file.     Family History  Problem Relation Age of Onset  . Diabetes Mother   . CVA Mother   . CVA Father   . CVA Maternal Grandmother     Social History   Tobacco Use  . Smoking status: Former Smoker    Packs/day: 1.00    Years: 40.00    Pack years: 40.00    Types: Cigarettes    Quit date: 05/02/2002    Years since quitting: 17.2  . Smokeless tobacco: Never Used  Substance Use Topics  . Alcohol use: No  . Drug use: No    Home Medications Prior to Admission medications   Medication Sig Start Date End Date Taking? Authorizing Provider  ALPRAZolam (XANAX) 0.25 MG tablet Take 0.5 tablets (0.125 mg total) by mouth 2 (two) times daily as needed for anxiety. 10/28/17   Domenic Polite, MD  apixaban (ELIQUIS) 5 MG TABS tablet Take 1 tablet (5 mg total) by mouth 2 (two) times daily. 06/20/14   Regalado, Belkys A, MD  bisacodyl (DULCOLAX) 10 MG suppository Place 10 mg rectally daily as needed (constipation not relieved by MOM).    [provider]  Cholecalciferol (VITAMIN D3) 5000 UNITS CAPS Take 5,000 Units by mouth daily.    [provider]  diltiazem (DILACOR XR) 180 MG 24 hr capsule Take 180 mg by mouth 2 (two) times daily.     [provider]  furosemide (LASIX) 40 MG tablet Take 1 tablet (40 mg total) by mouth daily. 12/27/18 04/26/19  Rai,  Vernelle Emerald, MD  hypromellose (SYSTANE OVERNIGHT THERAPY) 0.3 % GEL ophthalmic ointment Place 1 application into both eyes 4 (four) times daily as needed for dry eyes.    [provider]  iron polysaccharides (NIFEREX) 150 MG capsule Take 1 capsule (150 mg total) by mouth daily. 12/28/18   Rai, Ripudeep Raliegh Ip, MD  Magnesium Hydroxide (MILK OF MAGNESIA PO) Take 30 mLs by mouth daily as needed (if no BM in 3 days).    [provider]  metoprolol tartrate (LOPRESSOR) 25 MG tablet Take 1 tablet (25 mg total) by mouth 2 (two) times daily. 11/06/17 12/24/18  Elodia Florence., MD  Multiple Vitamin (MULTIVITAMIN WITH MINERALS) TABS tablet Take 1 tablet by mouth daily. Centrum Silver    [provider]  pantoprazole (PROTONIX) 40 MG tablet TAKE 1 TABLET EVERY MORNING Patient taking differently: Take 40 mg by mouth daily.     Hendricks Limes, MD  senna (SENOKOT) 8.6 MG TABS tablet Take 1 tablet by mouth 2 (two) times daily.    [provider]  Sodium  Phosphates (FLEET ENEMA RE) Place 1 enema rectally daily as needed (constipation not relieved by bisacodyl suppository).    [provider]    Allergies    Metoclopramide hcl, Penicillins, Aspirin, and Zetia [ezetimibe]  Review of Systems   Review of Systems  Constitutional: Positive for fatigue. Negative for chills, diaphoresis and fever.  HENT: Negative for congestion.   Respiratory: Negative for chest tightness, shortness of breath, wheezing and stridor.   Cardiovascular: Negative for chest pain, palpitations and leg swelling.  Gastrointestinal: Negative for abdominal pain, constipation, diarrhea, nausea and vomiting.  Genitourinary: Positive for frequency. Negative for dysuria and flank pain.  Musculoskeletal: Negative for back pain, neck pain and neck stiffness.  Neurological: Positive for weakness and headaches. Negative for dizziness, facial asymmetry, speech difficulty, light-headedness and numbness.    Psychiatric/Behavioral: Negative for agitation.  All other systems reviewed and are negative.   Physical Exam Updated Vital Signs BP 127/68 (BP Location: Right Arm)   Pulse 93   Temp 98.3 F (36.8 C) (Oral)   Resp 15   SpO2 95%   Physical Exam Vitals and nursing note reviewed.  Constitutional:      General: She is not in acute distress.    Appearance: She is well-developed. She is not ill-appearing or diaphoretic.  HENT:     Head: Normocephalic and atraumatic.     Right Ear: External ear normal.     Left Ear: External ear normal.     Nose: Nose normal. No congestion or rhinorrhea.     Mouth/Throat:     Mouth: Mucous membranes are moist.     Pharynx: No oropharyngeal exudate or posterior oropharyngeal erythema.  Eyes:     Conjunctiva/sclera: Conjunctivae normal.     Pupils: Pupils are equal, round, and reactive to light.  Cardiovascular:     Rate and Rhythm: Normal rate.     Pulses: Normal pulses.     Heart sounds: No murmur.  Pulmonary:     Effort: Pulmonary effort is normal. No respiratory distress.     Breath sounds: No stridor. No wheezing, rhonchi or rales.  Chest:     Chest wall: No tenderness.  Abdominal:     General: There is no distension.     Tenderness: There is no abdominal tenderness. There is no right CVA tenderness or rebound.  Musculoskeletal:        General: No tenderness or signs of injury.     Cervical back: Normal range of motion and neck supple.     Right lower leg: No edema.     Left lower leg: No edema.  Skin:    General: Skin is warm.     Coloration: Skin is not pale.     Findings: No erythema or rash.  Neurological:     Mental Status: She is alert and oriented to person, place, and time.     Sensory: No sensory deficit.     Motor: Weakness present. No abnormal muscle tone.     Deep Tendon Reflexes: Reflexes are normal and symmetric.     Comments: Subtle right leg weakness compared.  Psychiatric:        Mood and Affect: Mood normal.      ED Results / Procedures / Treatments   Labs (all labs ordered are listed, but only abnormal results are displayed) Labs Reviewed  CBC WITH DIFFERENTIAL/PLATELET - Abnormal; Notable for the following components:      Result Value   Hemoglobin 15.3 (*)    HCT  46.7 (*)    MCV 101.1 (*)    All other components within normal limits  COMPREHENSIVE METABOLIC PANEL - Abnormal; Notable for the following components:   Glucose, Bld 105 (*)    BUN 27 (*)    Creatinine, Ser 1.13 (*)    GFR calc non Af Amer 43 (*)    GFR calc Af Amer 50 (*)    All other components within normal limits  URINALYSIS, ROUTINE W REFLEX MICROSCOPIC - Abnormal; Notable for the following components:   APPearance HAZY (*)    Nitrite POSITIVE (*)    Leukocytes,Ua TRACE (*)    Bacteria, UA MANY (*)    All other components within normal limits  CBG MONITORING, ED - Abnormal; Notable for the following components:   Glucose-Capillary 111 (*)    All other components within normal limits  URINE CULTURE  TSH    EKG EKG Interpretation  Date/Time:  Monday August 05 2019 14:12:26 EDT Ventricular Rate:  65 PR Interval:    QRS Duration: 90 QT Interval:  406 QTC Calculation: 423 R Axis:   -32 Text Interpretation: Sinus rhythm Left atrial enlargement Left axis deviation Baseline wander in lead(s) V3 When compared to prior, similar; No STEMI Confirmed by Antony Blackbird [phone]) on 08/05/2019 2:18:29 PM   Radiology DG Chest Portable 1 View  Result Date: 08/05/2019 CLINICAL DATA:  Fatigue. Neuro symptoms. Decreased mobility and weakness. EXAM: PORTABLE CHEST 1 VIEW COMPARISON:  12/24/2018 FINDINGS: There is stable enlarged configuration of the heart. Hiatal hernia present. Stable elevation of the RIGHT hemidiaphragm. The lungs are free of focal consolidations and pleural effusions. No pulmonary edema. There is atherosclerotic calcification of the thoracic aorta. Chronic changes are seen in both shoulders. IMPRESSION: Stable  cardiomegaly.  Large hiatal hernia. Aortic atherosclerosis.  (ICD10-I70.0) Electronically Signed   By: Nolon Nations M.D.   On: 08/05/2019 15:45    Procedures Procedures (including critical care time)  Medications Ordered in ED Medications  cefTRIAXone (ROCEPHIN) 1 g in sodium chloride 0.9 % 100 mL IVPB (has no administration in time range)    ED Course  I have reviewed the triage vital signs and the nursing notes.  Pertinent labs & imaging results that were available during my care of the patient were reviewed by me and considered in my medical decision making (see chart for details).    MDM Rules/Calculators/A&P                      Jacqueline [name] is a 86 y.o. female with a past medical history significant for paroxysmal atrial fibrillation on Eliquis, GERD, prior TIA/stroke, hyperlipidemia, and chronic right knee pain who presents with right leg weakness, fatigue, and urinary frequency.  Patient reports that for the last week or so she has been having worsening right leg weakness and today she was guided to the ground by family in the bathroom and could not get up.  She reports her leg is weaker than ever.  She reports that she is chronically had right knee pain requiring injections but no new injuries.  She denies any numbness.  She is unsure if this feels like prior stroke.  She reports that she is having urinary frequency and feeling very fatigued and tired but denies any fevers, chills, chest pain, palpitations, shortness of breath, nausea, or vomiting.  She does report she is having some left-sided occipital headache that does not go to the neck.  She reports that this is been going  on for several days as well.  She denies any other complaints including no neck stiffness.  On exam, patient has very faint weakness in the right leg compared to left.  No numbness on exam.  Patient's lungs were clear and chest was nontender.  Abdomen was nontender.  Symmetric smile with no slurred speech.   Pupils are symmetric and reactive.  Normal finger-nose-finger testing.  Normal grip strength and sensation bilaterally.  Good pulses in extremities.  Patient has a knee brace on her right knee due to chronic pain.  Back was nontender and hips were nontender.  Given the patient's report that she is having a left-sided headache and new right leg weakness, we will get a head CT and other labs.  With her reported frequency will get labs and urinalysis and culture.  I suspect that the patient's fatigue and weakness is multifactorial however we will get imaging and labs before discussing disposition.  If CT is completely unremarkable and she is still having weakness unilaterally, patient may need MRI given her history of strokes.  Care transferred to oncoming team for further evaluation and management   Final Clinical Impression(s) / ED Diagnoses Final diagnoses:  Weakness  Urinary frequency  Nonintractable headache, unspecified chronicity pattern, unspecified headache type  Fatigue, unspecified type    Clinical Impression: 1. Weakness   2. Urinary frequency   3. Nonintractable headache, unspecified chronicity pattern, unspecified headache type   4. Fatigue, unspecified type   5. Pain     Disposition: Care transferred to oncoming team for further evaluation and management  This note was prepared with assistance of Dragon voice recognition software. Occasional wrong-word or sound-a-like substitutions may have occurred due to the inherent limitations of voice recognition software.      Marianny Goris, Gwenyth Allegra, MD 08/05/19 [phone]

## 2019-08-05 NOTE — ED Provider Notes (Signed)
Blood pressure 127/68, pulse 65, temperature 98.3 F (36.8 C), temperature source Oral, resp. rate 15, SpO2 100 %.  Assuming care from Dr. Sherry Ruffing.  In short, Jacqueline [name] is a 86 y.o. female with a chief complaint of Weakness .  Refer to the original H&P for additional details.  The current plan of care is to f/u on CT head with UA and labs. Question unilateral weakness.   04:40 PM  Reevaluated patient.  She does have evidence of a urinary tract infection on UA. No recent urine cultures for review. No leukocytosis.  TSH is normal.  CT head is pending.  Chest x-ray is clear.  Have added on plain films of the right hip and knee after her event today.  Patient is having some difficulty with getting her words out and seems to slightly confused.  She is taking off her nasal cannula oxygen and thinking this is her IV access.  No major confusion but wonder if UTI could be contributing here. Will start Rocephin. Will follow after imaging.   06:36 PM  Discussed patient's case with [facility], Dr. Doristine Bosworth to request admission. Patient and family (if present) updated with plan. Care transferred to [facility] service.  I reviewed all nursing notes, vitals, pertinent old records, EKGs, labs, imaging (as available).     Margette Fast, MD 08/05/19 [phone]

## 2019-08-05 NOTE — H&P (Signed)
History and Physical    Jacqueline [name] [id number] DOB: August 19, 1928 DOA: 08/05/2019  PCP: Jacqueline Manes, MD  Patient coming from: Home I have personally briefly reviewed patient's old medical records in [place]  Chief Complaint: Right knee pain and generalized weakness  HPI: Jacqueline [name] is a 86 y.o. female with medical history significant of paroxysmal A. fib on Eliquis, arthritis of knee, hypertension, hyperlipidemia, GERD, TIA/stroke, chronic diastolic congestive heart failure presents to emergency department due to right knee pain and generalized weakness.  Reports generalized weakness and increased urinary frequency since 2 days.  Patient was having some difficulty with getting her words out and seems to slightly confused.  Patient tells me that she had right leg weakness and today she was guided to the ground by the family in the bathroom and could not get up due to weakness and right knee pain.  No history of fall, head trauma, seizure, loss of consciousness, headache, blurry vision, chest pain, shortness of breath, palpitation, leg swelling, fever, chills, dysuria, hematuria, foul-smelling urine, lower back pain, lower abdominal pain, nausea, vomiting, decreased appetite, bowel symptoms.  She lives alone at home.  Her son visits her once in 2 weeks.  ED Course: Upon arrival to ED: Patient's vital signs stable.  She is afebrile with no leukocytosis.  CMP shows AKI on CKD.  UA positive for nitrites and leukocytes.  Chest x-ray shows stable cardiomegaly.  CT head negative for acute findings, x-ray of pelvis negative for fracture.  Right knee x-ray shows moderate tricompartment degenerative changes.  No fracture.  Patient received IV Rocephin in ED.  [facility] hospitalist consulted for admission for UTI and generalized weakness.  Review of Systems: As per HPI otherwise negative.    Past Medical History:  Diagnosis Date  . Allergy, unspecified not elsewhere classified   . Anemia,  unspecified   . Anxiety state, unspecified   . Atrial fibrillation with rapid ventricular response ([place]) 03/13/12  . Blood in stool   . Complication of anesthesia    "hard to wake me up after colonoscopy" (03/13/12)  . Degeneration of lumbar or lumbosacral intervertebral disc   . GERD (gastroesophageal reflux disease)   . H/O hiatal hernia   . Hemorrhage of gastrointestinal tract, unspecified   . History of IBS   . Osteoarthrosis, unspecified whether generalized or localized, lower leg   . Other diseases of lung, not elsewhere classified   . Other dyspnea and respiratory abnormality   . Personal history of other diseases of digestive system   . Personal history of peptic ulcer disease   . Unspecified adverse effect of other drug, medicinal and biological substance(995.29)   . Unspecified cerebral artery occlusion with cerebral infarction 10/2008   "mini stroke" denies residual (03/13/12)  . Unspecified essential hypertension   . Unspecified paranoid state     Past Surgical History:  Procedure Laterality Date  . DILATION AND CURETTAGE OF UTERUS  1950's?  . ORIF HUMERUS FRACTURE Right 04/03/2014   Procedure: OPEN REDUCTION INTERNAL FIXATION (ORIF) RIGHT HUMERUS DISTAL FRACTURE;  Surgeon: Roseanne Kaufman, MD;  Location: [facility];  Service: Orthopedics;  Laterality: Right;  . PILONIDAL CYST / SINUS EXCISION  1957  . SHOULDER ARTHROSCOPY  ~ 2000   right     reports that she quit smoking about 17 years ago. Her smoking use included cigarettes. She has a 40.00 pack-year smoking history. She has never used smokeless tobacco. She reports that she does not drink alcohol or use drugs.  Allergies  Allergen Reactions  . Metoclopramide Hcl Other (See Comments)     Tremors  . Penicillins Itching    Has patient had a PCN reaction causing immediate rash, facial/tongue/throat swelling, SOB or lightheadedness with hypotension: Yes Has patient had a PCN reaction causing severe rash involving mucus  membranes or skin necrosis: Yes Has patient had a PCN reaction that required hospitalization:No Has patient had a PCN reaction occurring within the last 10 years: No If all of the above answers are "NO", then may proceed with Cephalosporin use.   . Aspirin Other (See Comments)    "gave me stomach aches; made me bleed" (03/13/12)  . Zetia [Ezetimibe] Other (See Comments)    Stomach and back pain    Family History  Problem Relation Age of Onset  . Diabetes Mother   . CVA Mother   . CVA Father   . CVA Maternal Grandmother     Prior to Admission medications   Medication Sig Start Date End Date Taking? Authorizing Provider  ALPRAZolam (XANAX) 0.25 MG tablet Take 0.5 tablets (0.125 mg total) by mouth 2 (two) times daily as needed for anxiety. 10/28/17   Domenic Polite, MD  apixaban (ELIQUIS) 5 MG TABS tablet Take 1 tablet (5 mg total) by mouth 2 (two) times daily. 06/20/14   Regalado, Belkys A, MD  bisacodyl (DULCOLAX) 10 MG suppository Place 10 mg rectally daily as needed (constipation not relieved by MOM).    [provider]  Cholecalciferol (VITAMIN D3) 5000 UNITS CAPS Take 5,000 Units by mouth daily.    [provider]  diltiazem (DILACOR XR) 180 MG 24 hr capsule Take 180 mg by mouth 2 (two) times daily.     [provider]  furosemide (LASIX) 40 MG tablet Take 1 tablet (40 mg total) by mouth daily. 12/27/18 04/26/19  Rai, Vernelle Emerald, MD  hypromellose (SYSTANE OVERNIGHT THERAPY) 0.3 % GEL ophthalmic ointment Place 1 application into both eyes 4 (four) times daily as needed for dry eyes.    [provider]  iron polysaccharides (NIFEREX) 150 MG capsule Take 1 capsule (150 mg total) by mouth daily. 12/28/18   Rai, Ripudeep Raliegh Ip, MD  Magnesium Hydroxide (MILK OF MAGNESIA PO) Take 30 mLs by mouth daily as needed (if no BM in 3 days).    [provider]  metoprolol tartrate (LOPRESSOR) 25 MG tablet Take 1 tablet (25 mg total) by mouth 2 (two) times daily.  11/06/17 12/24/18  Elodia Florence., MD  Multiple Vitamin (MULTIVITAMIN WITH MINERALS) TABS tablet Take 1 tablet by mouth daily. Centrum Silver    [provider]  pantoprazole (PROTONIX) 40 MG tablet TAKE 1 TABLET EVERY MORNING Patient taking differently: Take 40 mg by mouth daily.     Hendricks Limes, MD  senna (SENOKOT) 8.6 MG TABS tablet Take 1 tablet by mouth 2 (two) times daily.    [provider]  Sodium Phosphates (FLEET ENEMA RE) Place 1 enema rectally daily as needed (constipation not relieved by bisacodyl suppository).    [provider]    Physical Exam: Vitals:   08/05/19 1419 08/05/19 1430 08/05/19 1657 08/05/19 1758  BP:   118/60 (!) 152/84  Pulse:  65  66  Resp:  15  17  Temp:      TempSrc:      SpO2: 95% 100%  92%    Constitutional: NAD, calm, comfortable, on room air Eyes: PERRL, lids and conjunctivae normal ENMT: Mucous membranes are moist. Posterior pharynx clear  of any exudate or lesions.Normal dentition.  Neck: normal, supple, no masses, no thyromegaly Respiratory: clear to auscultation bilaterally, no wheezing, no crackles. Normal respiratory effort. No accessory muscle use.  Cardiovascular: Regular rate and rhythm, no murmurs / rubs / gallops. No extremity edema. 2+ pedal pulses. No carotid bruits.  Abdomen: no tenderness, no masses palpated. No hepatosplenomegaly. Bowel sounds positive.  Musculoskeletal: no clubbing / cyanosis. No joint deformity upper and lower extremities. Good ROM, no contractures. Normal muscle tone.  Skin: no rashes, lesions, ulcers. No induration Neurologic: CN 2-12 grossly intact. Sensation intact, DTR normal. Strength 5/5 in all 4.  Psychiatric: Normal judgment and insight. Alert and oriented x 3. Normal mood.    Labs on Admission: I have personally reviewed following labs and imaging studies  CBC: Recent Labs  Lab 08/05/19 1438  WBC 6.7  NEUTROABS 4.9  HGB 15.3*  HCT 46.7*  MCV 101.1*  PLT  99991111   Basic Metabolic Panel: Recent Labs  Lab 08/05/19 1438  NA 144  K 3.6  CL 103  CO2 30  GLUCOSE 105*  BUN 27*  CREATININE 1.13*  CALCIUM 9.8   GFR: CrCl cannot be calculated (Unknown ideal weight.). Liver Function Tests: Recent Labs  Lab 08/05/19 1438  AST 19  ALT 10  ALKPHOS 65  BILITOT 0.6  PROT 6.5  ALBUMIN 3.7   No results for input(s): LIPASE, AMYLASE in the last 168 hours. No results for input(s): AMMONIA in the last 168 hours. Coagulation Profile: No results for input(s): INR, PROTIME in the last 168 hours. Cardiac Enzymes: No results for input(s): CKTOTAL, CKMB, CKMBINDEX, TROPONINI in the last 168 hours. BNP (last 3 results) No results for input(s): PROBNP in the last 8760 hours. HbA1C: No results for input(s): HGBA1C in the last 72 hours. CBG: Recent Labs  Lab 08/05/19 1412  GLUCAP 111*   Lipid Profile: No results for input(s): CHOL, HDL, LDLCALC, TRIG, CHOLHDL, LDLDIRECT in the last 72 hours. Thyroid Function Tests: Recent Labs    08/05/19 1508  TSH 1.780   Anemia Panel: No results for input(s): VITAMINB12, FOLATE, FERRITIN, TIBC, IRON, RETICCTPCT in the last 72 hours. Urine analysis:    Component Value Date/Time   COLORURINE YELLOW 08/05/2019 1533   APPEARANCEUR HAZY (A) 08/05/2019 1533   LABSPEC 1.017 08/05/2019 1533   PHURINE 5.0 08/05/2019 1533   GLUCOSEU NEGATIVE 08/05/2019 1533   HGBUR NEGATIVE 08/05/2019 1533   BILIRUBINUR NEGATIVE 08/05/2019 1533   KETONESUR NEGATIVE 08/05/2019 1533   PROTEINUR NEGATIVE 08/05/2019 1533   UROBILINOGEN 0.2 06/18/2014 1950   NITRITE POSITIVE (A) 08/05/2019 1533   LEUKOCYTESUR TRACE (A) 08/05/2019 1533    Radiological Exams on Admission: CT Head Wo Contrast  Result Date: 08/05/2019 CLINICAL DATA:  Headaches for the last 2 days EXAM: CT HEAD WITHOUT CONTRAST TECHNIQUE: Contiguous axial images were obtained from the base of the skull through the vertex without intravenous contrast. COMPARISON:   11/03/2017 FINDINGS: Brain: No evidence of acute infarction, hemorrhage, extra-axial collection, ventriculomegaly, or mass effect. Generalized cerebral atrophy. Periventricular white matter low attenuation likely secondary to microangiopathy. Vascular: Cerebrovascular atherosclerotic calcifications are noted. Skull: Negative for fracture or focal lesion. Sinuses/Orbits: Visualized portions of the orbits are unremarkable. Visualized portions of the paranasal sinuses and mastoid air cells are unremarkable. Other: None. IMPRESSION: 1. No acute intracranial pathology. 2. Chronic microvascular disease and cerebral atrophy. Electronically Signed   By: Kathreen Devoid   On: 08/05/2019 18:02   DG Chest Portable 1 View  Result Date:  08/05/2019 CLINICAL DATA:  Fatigue. Neuro symptoms. Decreased mobility and weakness. EXAM: PORTABLE CHEST 1 VIEW COMPARISON:  12/24/2018 FINDINGS: There is stable enlarged configuration of the heart. Hiatal hernia present. Stable elevation of the RIGHT hemidiaphragm. The lungs are free of focal consolidations and pleural effusions. No pulmonary edema. There is atherosclerotic calcification of the thoracic aorta. Chronic changes are seen in both shoulders. IMPRESSION: Stable cardiomegaly.  Large hiatal hernia. Aortic atherosclerosis.  (ICD10-I70.0) Electronically Signed   By: Nolon Nations M.D.   On: 08/05/2019 15:45   DG Knee Complete 4 Views Right  Result Date: 08/05/2019 CLINICAL DATA:  Fall, pain EXAM: RIGHT KNEE - COMPLETE 4+ VIEW COMPARISON:  None. FINDINGS: Moderate tricompartment degenerative changes. No acute bony abnormality. Specifically, no fracture, subluxation, or dislocation. No joint effusion. Vascular calcifications noted. IMPRESSION: Moderate tricompartment degenerative changes. No acute bony abnormality. Electronically Signed   By: Rolm Baptise M.D.   On: 08/05/2019 17:55   DG HIP UNILAT WITH PELVIS 2-3 VIEWS RIGHT  Result Date: 08/05/2019 CLINICAL DATA:  Pain EXAM: DG  HIP (WITH OR WITHOUT PELVIS) 2-3V RIGHT COMPARISON:  None. FINDINGS: Moderate degenerative changes in the hips bilaterally, left greater than right. SI joints symmetric and unremarkable. No acute bony abnormality. Specifically, no fracture, subluxation, or dislocation. IMPRESSION: No acute bony abnormality. Electronically Signed   By: Rolm Baptise M.D.   On: 08/05/2019 17:55    EKG: Independently reviewed.  Sinus rhythm, left atrial enlargement.  Left axis deviation.  No ST elevation or depression noted.  Assessment/Plan Principal Problem:   UTI (urinary tract infection) Active Problems:   PAF (paroxysmal atrial fibrillation) (HCC)   HLD (hyperlipidemia)   Generalized weakness   Macrocytosis   Acute on chronic kidney failure (HCC)   Generalized weakness:  -Likely in the setting of underlying UTI.  Patient's vital signs stable upon arrival. -she is afebrile with no leukocytosis. -UA is positive for nitrites and leukocytes. -CT head without contrast came back negative for acute findings.  Reviewed right knee x-ray and pelvic x-ray.  Chest x-ray is negative for pneumonia. -She received IV Rocephin in ED -Admit patient on the floor. -Continue IV Rocephin.  Urine culture and blood culture is obtained and is pending. -Start on gentle hydration. -Consulted PT/OT  Chronic right knee pain: -Reviewed x-ray-shows degenerative changes. -Tylenol as needed for pain control. -Consulted PT  Paroxysmal A. fib: Continue Eliquis and diltiazem. -On telemetry.  Hypertension: Continue metoprolol and diltiazem  Macrocytosis: H&H is stable -Check folate, B12  Acute on chronic kidney failure stage III: -Creatinine today is 1.13, GFR: 43.(Baseline creatinine: 0.96, GFR: 53) -Start on gentle hydration.  Repeat BMP tomorrow a.m.  Chronic diastolic congestive heart failure: Patient is euvolemic -Reviewed echo from 12/25/2018 which showed preserved ejection fraction. -Continue Lasix. -Strict INO's and  daily weight.  Continue to monitor.  GERD: Continue PPI  Unable to safely start patient's home medication as med reconciliation is pending by pharmacy.  DVT prophylaxis: Eliquis/TED/SCD Code Status: Full code Family Communication: None present at bedside.  Plan of care discussed with patient in length and she verbalized understanding and agreed with it. Disposition Plan: Likely home tomorrow Consults called: None Admission status: Observation   Mckinley Jewel MD [facility] Pager [phone]  If 7PM-7AM, please contact night-coverage [email or web address] Password [facility]  08/05/2019, 6:39 PM

## 2019-08-05 NOTE — ED Triage Notes (Signed)
Pt presents from home where she lives alone via EMS. Pt called 911 with life alert for decreased mobility, weakness starting today, was too weak to leave restroom with walker. Last seen well 0930 today. VAN neg with EMS. Denies recent fall. Take eliquis.   EMS exam: Wears R knee brace from injury 1 yr ago, uses walker, A&Ox4, difficulty finding some words is baseline, EKG unremark  H/o TIAs

## 2019-08-05 NOTE — ED Notes (Signed)
Purewick applied to pt °

## 2019-08-05 NOTE — ED Notes (Addendum)
ED Provider at bedside. Endorses urinary sx, bilat shoulder pain, HA with provider

## 2019-08-05 NOTE — ED Notes (Signed)
Blood cultures ordered after abx started

## 2019-08-06 ENCOUNTER — Observation Stay (HOSPITAL_COMMUNITY): Payer: Medicare Other

## 2019-08-06 ENCOUNTER — Encounter (HOSPITAL_COMMUNITY): Payer: Self-pay | Admitting: Internal Medicine

## 2019-08-06 DIAGNOSIS — N3000 Acute cystitis without hematuria: Secondary | ICD-10-CM

## 2019-08-06 DIAGNOSIS — Z8673 Personal history of transient ischemic attack (TIA), and cerebral infarction without residual deficits: Secondary | ICD-10-CM | POA: Diagnosis not present

## 2019-08-06 DIAGNOSIS — J9601 Acute respiratory failure with hypoxia: Principal | ICD-10-CM

## 2019-08-06 DIAGNOSIS — Z7901 Long term (current) use of anticoagulants: Secondary | ICD-10-CM | POA: Diagnosis not present

## 2019-08-06 DIAGNOSIS — Z823 Family history of stroke: Secondary | ICD-10-CM | POA: Diagnosis not present

## 2019-08-06 DIAGNOSIS — Z20822 Contact with and (suspected) exposure to covid-19: Secondary | ICD-10-CM | POA: Diagnosis present

## 2019-08-06 DIAGNOSIS — M2550 Pain in unspecified joint: Secondary | ICD-10-CM | POA: Diagnosis not present

## 2019-08-06 DIAGNOSIS — M1711 Unilateral primary osteoarthritis, right knee: Secondary | ICD-10-CM | POA: Diagnosis present

## 2019-08-06 DIAGNOSIS — R350 Frequency of micturition: Secondary | ICD-10-CM | POA: Diagnosis not present

## 2019-08-06 DIAGNOSIS — D7589 Other specified diseases of blood and blood-forming organs: Secondary | ICD-10-CM | POA: Diagnosis present

## 2019-08-06 DIAGNOSIS — I5032 Chronic diastolic (congestive) heart failure: Secondary | ICD-10-CM

## 2019-08-06 DIAGNOSIS — N390 Urinary tract infection, site not specified: Secondary | ICD-10-CM | POA: Diagnosis present

## 2019-08-06 DIAGNOSIS — G8929 Other chronic pain: Secondary | ICD-10-CM | POA: Diagnosis present

## 2019-08-06 DIAGNOSIS — F411 Generalized anxiety disorder: Secondary | ICD-10-CM | POA: Diagnosis present

## 2019-08-06 DIAGNOSIS — K589 Irritable bowel syndrome without diarrhea: Secondary | ICD-10-CM | POA: Diagnosis present

## 2019-08-06 DIAGNOSIS — I130 Hypertensive heart and chronic kidney disease with heart failure and stage 1 through stage 4 chronic kidney disease, or unspecified chronic kidney disease: Secondary | ICD-10-CM | POA: Diagnosis present

## 2019-08-06 DIAGNOSIS — R0902 Hypoxemia: Secondary | ICD-10-CM | POA: Diagnosis not present

## 2019-08-06 DIAGNOSIS — I25810 Atherosclerosis of coronary artery bypass graft(s) without angina pectoris: Secondary | ICD-10-CM | POA: Diagnosis present

## 2019-08-06 DIAGNOSIS — N183 Chronic kidney disease, stage 3 unspecified: Secondary | ICD-10-CM

## 2019-08-06 DIAGNOSIS — Z8711 Personal history of peptic ulcer disease: Secondary | ICD-10-CM | POA: Diagnosis not present

## 2019-08-06 DIAGNOSIS — I499 Cardiac arrhythmia, unspecified: Secondary | ICD-10-CM | POA: Diagnosis not present

## 2019-08-06 DIAGNOSIS — Z7401 Bed confinement status: Secondary | ICD-10-CM | POA: Diagnosis not present

## 2019-08-06 DIAGNOSIS — K219 Gastro-esophageal reflux disease without esophagitis: Secondary | ICD-10-CM | POA: Diagnosis present

## 2019-08-06 DIAGNOSIS — I480 Paroxysmal atrial fibrillation: Secondary | ICD-10-CM | POA: Diagnosis present

## 2019-08-06 DIAGNOSIS — Z87891 Personal history of nicotine dependence: Secondary | ICD-10-CM | POA: Diagnosis not present

## 2019-08-06 DIAGNOSIS — E785 Hyperlipidemia, unspecified: Secondary | ICD-10-CM | POA: Diagnosis present

## 2019-08-06 DIAGNOSIS — R4182 Altered mental status, unspecified: Secondary | ICD-10-CM | POA: Diagnosis not present

## 2019-08-06 DIAGNOSIS — R29818 Other symptoms and signs involving the nervous system: Secondary | ICD-10-CM | POA: Diagnosis not present

## 2019-08-06 DIAGNOSIS — N179 Acute kidney failure, unspecified: Secondary | ICD-10-CM

## 2019-08-06 DIAGNOSIS — E876 Hypokalemia: Secondary | ICD-10-CM | POA: Diagnosis not present

## 2019-08-06 DIAGNOSIS — K449 Diaphragmatic hernia without obstruction or gangrene: Secondary | ICD-10-CM | POA: Diagnosis present

## 2019-08-06 DIAGNOSIS — I2510 Atherosclerotic heart disease of native coronary artery without angina pectoris: Secondary | ICD-10-CM | POA: Diagnosis present

## 2019-08-06 DIAGNOSIS — M25561 Pain in right knee: Secondary | ICD-10-CM | POA: Diagnosis not present

## 2019-08-06 LAB — BASIC METABOLIC PANEL
Anion gap: 10 (ref 5–15)
BUN: 22 mg/dL (ref 8–23)
CO2: 31 mmol/L (ref 22–32)
Calcium: 9.4 mg/dL (ref 8.9–10.3)
Chloride: 104 mmol/L (ref 98–111)
Creatinine, Ser: 0.91 mg/dL (ref 0.44–1.00)
GFR calc Af Amer: >60 mL/min (ref >60)
GFR calc non Af Amer: 56 mL/min — ABNORMAL LOW (ref >60)
Glucose, Bld: 98 mg/dL (ref 70–99)
Potassium: 3.3 mmol/L — ABNORMAL LOW (ref 3.5–5.1)
Sodium: 145 mmol/L (ref 135–145)

## 2019-08-06 LAB — CBC
HCT: 44.6 % (ref 36.0–46.0)
Hemoglobin: 14.4 g/dL (ref 12.0–15.0)
MCH: 32.8 pg (ref 26.0–34.0)
MCHC: 32.3 g/dL (ref 30.0–36.0)
MCV: 101.6 fL — ABNORMAL HIGH (ref 80.0–100.0)
Platelets: 172 10*3/uL (ref 150–400)
RBC: 4.39 MIL/uL (ref 3.87–5.11)
RDW: 14.3 % (ref 11.5–15.5)
WBC: 7.1 10*3/uL (ref 4.0–10.5)
nRBC: 0 % (ref 0.0–0.2)

## 2019-08-06 LAB — CBG MONITORING, ED: Glucose-Capillary: 93 mg/dL (ref 70–99)

## 2019-08-06 LAB — FOLATE RBC
Folate, Hemolysate: >620 ng/mL
Folate, RBC: >1422 ng/mL (ref >498)
Hematocrit: 43.6 % (ref 34.0–46.6)

## 2019-08-06 MED ORDER — ALPRAZOLAM 0.25 MG PO TABS
0.2500 mg | ORAL_TABLET | Freq: Two times a day (BID) | ORAL | Status: DC | PRN
  Administered 2019-08-07 (×2): 0.25 mg via ORAL
  Filled 2019-08-06 (×2): qty 1

## 2019-08-06 MED ORDER — LORAZEPAM 2 MG/ML IJ SOLN
0.5000 mg | Freq: Once | INTRAMUSCULAR | Status: AC
  Administered 2019-08-06: 0.5 mg via INTRAVENOUS
  Filled 2019-08-06: qty 1

## 2019-08-06 MED ORDER — METOPROLOL SUCCINATE ER 50 MG PO TB24
50.0000 mg | ORAL_TABLET | Freq: Two times a day (BID) | ORAL | Status: DC
  Administered 2019-08-06 – 2019-08-07 (×3): 50 mg via ORAL
  Filled 2019-08-06: qty 2
  Filled 2019-08-06 (×2): qty 1

## 2019-08-06 MED ORDER — POTASSIUM CHLORIDE CRYS ER 20 MEQ PO TBCR
40.0000 meq | EXTENDED_RELEASE_TABLET | Freq: Once | ORAL | Status: AC
  Administered 2019-08-06: 40 meq via ORAL
  Filled 2019-08-06: qty 2

## 2019-08-06 MED ORDER — LORAZEPAM 2 MG/ML IJ SOLN
INTRAMUSCULAR | Status: AC
  Filled 2019-08-06: qty 1

## 2019-08-06 MED ORDER — DILTIAZEM HCL ER COATED BEADS 180 MG PO CP24
180.0000 mg | ORAL_CAPSULE | Freq: Every day | ORAL | Status: DC
  Administered 2019-08-06 – 2019-08-07 (×2): 180 mg via ORAL
  Filled 2019-08-06 (×2): qty 1

## 2019-08-06 MED ORDER — APIXABAN 5 MG PO TABS
5.0000 mg | ORAL_TABLET | Freq: Two times a day (BID) | ORAL | Status: DC
  Administered 2019-08-07: 5 mg via ORAL
  Filled 2019-08-06: qty 1

## 2019-08-06 MED ORDER — SODIUM CHLORIDE 0.9 % IV SOLN: INTRAVENOUS | Status: AC

## 2019-08-06 MED ORDER — APIXABAN 5 MG PO TABS: 5.0000 mg | ORAL_TABLET | Freq: Two times a day (BID) | ORAL | Status: DC

## 2019-08-06 NOTE — ED Notes (Signed)
Breakfast Ordered 

## 2019-08-06 NOTE — Progress Notes (Signed)
PT Cancellation Note  Patient Details Name: Mileena Tom MRN: [id number] DOB: 24-Dec-1928   Cancelled Treatment:    Reason Eval/Treat Not Completed: Patient at procedure or test/unavailable (MRI). Will follow-up for PT evaluation as schedule permits.  Mabeline Caras, PT, DPT Acute Rehabilitation Services  Pager [phone] Office [place] 08/06/2019, 10:17 AM

## 2019-08-06 NOTE — Progress Notes (Signed)
Progress Note    Jacqueline [name]  [id number] DOB: [birth year]  DOA: 08/05/2019 PCP: Lajean Manes, MD    Brief Narrative:   Chief complaint: Right knee pain/generalized weakness  Medical records reviewed and are as summarized below:  Jacqueline [name] is an 86 y.o. female with a past medical history that includes paroxysmal A. fib taking Eliquis, hypertension, TIA/stroke, chronic diastolic heart failure, arthritis of the knee, anxiety presents to the emergency department chief complaint generalized weakness and right knee pain.  Work-up in the emergency department reveals acute respiratory failure with hypoxia, urinary tract infection, hypokalemia, acute kidney injury superimposed on chronic kidney disease.  Is provided with oxygen supplementation, gentle IV fluids and Rocephin is initiated.  Assessment/Plan:   Principal Problem:   Acute respiratory failure with hypoxia (HCC) Active Problems:   UTI (urinary tract infection)   Generalized weakness   Acute on chronic kidney failure (HCC)   PAF (paroxysmal atrial fibrillation) (HCC)   Hypokalemia   Chronic diastolic CHF (congestive heart failure) (HCC)   Benign essential HTN   Chronic knee pain   History of multiple cerebrovascular accidents (CVAs)   Macrocytosis   Anxiety   CAD (coronary artery disease) of artery bypass graft   HLD (hyperlipidemia)   #1.  Acute respiratory failure with hypoxia.  Oxygen saturation level noted to be 85% on room air in the emergency department.  Provided with oxygen supplementation and oxygen saturation level increased to 96%.  Chest x-ray reveals stable cardiomegaly and large hiatal hernia with lungs free from consolidation or pleural effusion no pulmonary edema. -We will provide incentive spirometry -Mobilize as much as possible -Wean oxygen as able -Monitor oxygen saturation level  #2.  Urinary tract infection.  Urinalysis consistent with UTI.  Patient endorses some frequency and urgency.  Rocephin  initiated in the emergency department.  She is afebrile hemodynamically stable and nontoxic-appearing -Continue Rocephin -Follow urine culture -Adjust antibiotics as indicated  3.  Generalized weakness.  Likely multifactorial in the setting of #1 and 2.  She is also less mobile due to her chronic right knee pain.  She lives alone.  She has an aide that comes Monday through Friday from 10-1.  [place] 2 [place] and stays with her for a couple of days.  He states the pain in her right knee is worse.  X-rays are negative.  Has had injections in the past with little relief -Pain management -Treatment for #1 and #2 -PT evaluation -Mobilize -May benefit from short-term rehab and/or transitioning to assisted living -Anticipate [facility] consult  #4.  History of multiple CVAs in the past.  At baseline she does some word searching but during my exam she is having a very difficult time and appears somewhat confused.  Otherwise her neuro exam is benign.  Home medications include Eliquis.  She also demonstrates some throat clearing with conversation and reports that her aide has noted that she may need "some thickener for her liquids". -MRI of the brain for completeness as some concern for stroke -Speech therapy evaluation for aspiration risk -Not make her n.p.o. just yet -PT and OT eval  #5.  Acute kidney injury superimposed on chronic kidney disease stage III.  Creatinine appeared to be a little above baseline on presentation.  She received gentle IV fluids and creatinine is within the limits of normal. -Hold nephrotoxins as able -Monitor urine output  6.  Hypokalemia.  Mild.  Potassium level 3.3  on presentation.  Magnesium level 2.2. Home medications include Lasix -Lasix for now -Replete -Recheck in the morning  #7.  Chronic right knee pain.  Patient's had this pain for years but states it has worsened to the point she cannot bear weight.  X-ray reveals  degenerative changes but no effusion or fracture -Control -PT  #8.  Paroxysmal A. fib.  Home medications include Eliquis and diltiazem.  EKG with sinus rhythm -Continue home meds  #9.  Hypertension.  Blood pressure high end of normal.  Home medications include diltiazem, metoprolol, Lasix -Holding Lasix for now -Resume metoprolol and diltiazem -Monitor  #10.  Macrocytosis.  Hemoglobin is stable.  No sign symptoms of bleeding.  Vitamin B12 level slightly elevated.  Folate pending -Follow folate -Monitor  #11.  Chronic diastolic heart failure.  Euvolemic.  Home medications include Lasix and metoprolol.  Echo done 8 months ago revealed an EF of 60% with mildly increased left ventricular wall thickness no evidence of wall motion abnormalities -Hold Lasix for now -Monitor intake and output -Obtain daily weight     Family Communication/Anticipated D/C date and plan/Code Status   DVT prophylaxis: Lovenox ordered. Code Status: Full Code.  Family Communication: patient and left message for son freddie Disposition Plan: to be determined.    Medical Consultants:    None.   Anti-Infectives:    Rocephin 4/5>>  Subjective:   Reports continued pain of that right knee.  Denies shortness of breath.  Objective:    Vitals:   08/06/19 0600 08/06/19 0615 08/06/19 0630 08/06/19 0856  BP: (!) 158/87 (!) 149/89 (!) 164/70 (!) 183/62  Pulse: 68 74 64 83  Resp: 15 17 17  (!) 26  Temp:      TempSrc:      SpO2: 95% 95% 99% 92%  Weight:    79.4 kg  Height:    5\' 1"  (1.549 m)    Intake/Output Summary (Last 24 hours) at 08/06/2019 1018 Last data filed at 08/06/2019 0901 Gross per 24 hour  Intake --  Output 150 ml  Net -150 ml   Filed Weights   08/06/19 0856  Weight: 79.4 kg    Exam: General: Obese awake alert no acute distress CV: Regular rate and rhythm no murmur gallop or rub trace lower extremity edema Respiratory: No increased work of breathing with conversation breath  sounds are distant but clear I hear no wheeze no crackles Abdomen: Obese soft positive bowel sounds throughout nontender to palpation Musculoskeletal: Joints without swelling/erythema right knee may be slightly larger than left nontender to palpation decreased range of motion due to pain Neuro: Awake alert oriented to self and place, speech is clear obvious word searching/memory issues bilateral grip 5 out of 5 lower extremity strength 5 out of 5 bilaterally tongue midline  Data Reviewed:   I have personally reviewed following labs and imaging studies:  Labs: Labs show the following:   Basic Metabolic Panel: Recent Labs  Lab 08/05/19 1438 08/05/19 1944 08/06/19 0407  NA 144  --  145  K 3.6  --  3.3*  CL 103  --  104  CO2 30  --  31  GLUCOSE 105*  --  98  BUN 27*  --  22  CREATININE 1.13*  --  0.91  CALCIUM 9.8  --  9.4  MG  --  2.2  --    GFR Estimated Creatinine Clearance: 40 mL/min (by C-G formula based on SCr of 0.91 mg/dL). Liver Function Tests: Recent Labs  Lab 08/05/19 1438  AST 19  ALT 10  ALKPHOS 65  BILITOT 0.6  PROT 6.5  ALBUMIN 3.7   No results for input(s): LIPASE, AMYLASE in the last 168 hours. No results for input(s): AMMONIA in the last 168 hours. Coagulation profile No results for input(s): INR, PROTIME in the last 168 hours.  CBC: Recent Labs  Lab 08/05/19 1438 08/06/19 0407  WBC 6.7 7.1  NEUTROABS 4.9  --   HGB 15.3* 14.4  HCT 46.7* 44.6  MCV 101.1* 101.6*  PLT 190 172   Cardiac Enzymes: No results for input(s): CKTOTAL, CKMB, CKMBINDEX, TROPONINI in the last 168 hours. BNP (last 3 results) No results for input(s): PROBNP in the last 8760 hours. CBG: Recent Labs  Lab 08/05/19 1412  GLUCAP 111*   D-Dimer: No results for input(s): DDIMER in the last 72 hours. Hgb A1c: No results for input(s): HGBA1C in the last 72 hours. Lipid Profile: No results for input(s): CHOL, HDL, LDLCALC, TRIG, CHOLHDL, LDLDIRECT in the last 72  hours. Thyroid function studies: Recent Labs    08/05/19 1944  TSH 1.945   Anemia work up: Recent Labs    08/05/19 1830  VITAMINB12 996*   Sepsis Labs: Recent Labs  Lab 08/05/19 1438 08/06/19 0407  WBC 6.7 7.1    Microbiology Recent Results (from the past 240 hour(s))  SARS CORONAVIRUS 2 (TAT 6-24 HRS) Nasopharyngeal Nasopharyngeal Swab     Status: None   Collection Time: 08/05/19  6:39 PM   Specimen: Nasopharyngeal Swab  Result Value Ref Range Status   SARS Coronavirus 2 NEGATIVE NEGATIVE Final    Comment: (NOTE) SARS-CoV-2 target nucleic acids are NOT DETECTED. The SARS-CoV-2 RNA is generally detectable in upper and lower respiratory specimens during the acute phase of infection. Negative results do not preclude SARS-CoV-2 infection, do not rule out co-infections with other pathogens, and should not be used as the sole basis for treatment or other patient management decisions. Negative results must be combined with clinical observations, patient history, and epidemiological information. The expected result is Negative. Fact Sheet for Patients: SugarRoll.be Fact Sheet for Healthcare Providers: https://www.woods-mathews.com/ This test is not yet approved or cleared by the [place] FDA and  has been authorized for detection and/or diagnosis of SARS-CoV-2 by FDA under an Emergency Use Authorization (EUA). This EUA will remain  in effect (meaning this test can be used) for the duration of the COVID-19 declaration under Section 56 4(b)(1) of the Act, 21 U.S.C. section 360bbb-3(b)(1), unless the authorization is terminated or revoked sooner. Performed at [facility], [place] [address], [place], [place] [address]   Culture, blood (routine x 2)     Status: None (Preliminary result)   Collection Time: 08/05/19  8:19 PM   Specimen: BLOOD  Result Value Ref Range Status   Specimen Description BLOOD LEFT ANTECUBITAL  Final    Special Requests   Final    BOTTLES DRAWN AEROBIC AND ANAEROBIC Blood Culture adequate volume   Culture   Final    NO GROWTH < 12 HOURS Performed at [facility], [place] [address], [place], [place] [address]    Report Status PENDING  Incomplete    Procedures and diagnostic studies:  CT Head Wo Contrast  Result Date: 08/05/2019 CLINICAL DATA:  Headaches for the last 2 days EXAM: CT HEAD WITHOUT CONTRAST TECHNIQUE: Contiguous axial images were obtained from the base of the skull through the vertex without intravenous contrast. COMPARISON:  11/03/2017 FINDINGS: Brain: No  evidence of acute infarction, hemorrhage, extra-axial collection, ventriculomegaly, or mass effect. Generalized cerebral atrophy. Periventricular white matter low attenuation likely secondary to microangiopathy. Vascular: Cerebrovascular atherosclerotic calcifications are noted. Skull: Negative for fracture or focal lesion. Sinuses/Orbits: Visualized portions of the orbits are unremarkable. Visualized portions of the paranasal sinuses and mastoid air cells are unremarkable. Other: None. IMPRESSION: 1. No acute intracranial pathology. 2. Chronic microvascular disease and cerebral atrophy. Electronically Signed   By: Kathreen Devoid   On: 08/05/2019 18:02   MR BRAIN WO CONTRAST  Result Date: 08/06/2019 CLINICAL DATA:  Subacute neuro deficit. Atrial fibrillation on Eliquis. EXAM: MRI HEAD WITHOUT CONTRAST TECHNIQUE: Multiplanar, multiecho pulse sequences of the brain and surrounding structures were obtained without intravenous contrast. COMPARISON:  CT head 08/05/2019 FINDINGS: Brain: Negative for acute infarct Generalized atrophy without hydrocephalus. Chronic small infarcts in the cerebellum bilaterally. Chronic microvascular ischemic change in the white matter bilaterally. Chronic hemorrhage in the central pons. No mass or fluid collection. Vascular: Normal arterial flow voids. Skull and upper cervical spine: Negative Sinuses/Orbits:  Mild mucosal edema paranasal sinuses. Left cataract extraction Other: None IMPRESSION: Atrophy and chronic ischemic changes.  No acute infarct. Electronically Signed   By: Franchot Gallo M.D.   On: 08/06/2019 10:15   DG Chest Portable 1 View  Result Date: 08/05/2019 CLINICAL DATA:  Fatigue. Neuro symptoms. Decreased mobility and weakness. EXAM: PORTABLE CHEST 1 VIEW COMPARISON:  12/24/2018 FINDINGS: There is stable enlarged configuration of the heart. Hiatal hernia present. Stable elevation of the RIGHT hemidiaphragm. The lungs are free of focal consolidations and pleural effusions. No pulmonary edema. There is atherosclerotic calcification of the thoracic aorta. Chronic changes are seen in both shoulders. IMPRESSION: Stable cardiomegaly.  Large hiatal hernia. Aortic atherosclerosis.  (ICD10-I70.0) Electronically Signed   By: Nolon Nations M.D.   On: 08/05/2019 15:45   DG Knee Complete 4 Views Right  Result Date: 08/05/2019 CLINICAL DATA:  Fall, pain EXAM: RIGHT KNEE - COMPLETE 4+ VIEW COMPARISON:  None. FINDINGS: Moderate tricompartment degenerative changes. No acute bony abnormality. Specifically, no fracture, subluxation, or dislocation. No joint effusion. Vascular calcifications noted. IMPRESSION: Moderate tricompartment degenerative changes. No acute bony abnormality. Electronically Signed   By: Rolm Baptise M.D.   On: 08/05/2019 17:55   DG HIP UNILAT WITH PELVIS 2-3 VIEWS RIGHT  Result Date: 08/05/2019 CLINICAL DATA:  Pain EXAM: DG HIP (WITH OR WITHOUT PELVIS) 2-3V RIGHT COMPARISON:  None. FINDINGS: Moderate degenerative changes in the hips bilaterally, left greater than right. SI joints symmetric and unremarkable. No acute bony abnormality. Specifically, no fracture, subluxation, or dislocation. IMPRESSION: No acute bony abnormality. Electronically Signed   By: Rolm Baptise M.D.   On: 08/05/2019 17:55    Medications:   . [START ON 08/07/2019] apixaban  5 mg Oral BID  . diltiazem  180 mg Oral  Daily  . LORazepam      . LORazepam  0.5 mg Intravenous Once  . metoprolol succinate  50 mg Oral BID   Continuous Infusions: . sodium chloride 75 mL/hr at 08/05/19 1941  . cefTRIAXone (ROCEPHIN)  IV       LOS: 0 days   Radene Gunning NP  [facility]   How to contact the [facility] Attending or Consulting provider [place] or covering provider during after hours [place], for this patient?  1. Check the care team in [facility] and look for a) attending/consulting [facility] provider listed and b) the [facility] team listed 2. Log into [email or web address] and use [place]'s  universal password to access. If you do not have the password, please contact the hospital operator. 3. Locate the [facility] provider you are looking for under [facility] and page to a number that you can be directly reached. 4. If you still have difficulty reaching the provider, please page the [facility] (Director on Call) for the Hospitalists listed on amion for assistance.  08/06/2019, 10:18 AM

## 2019-08-06 NOTE — ED Notes (Signed)
Full bed changed performed. Pt sitting up and speaking with NP at bedside.  Breakfast at bedside.

## 2019-08-06 NOTE — Evaluation (Addendum)
Physical Therapy Evaluation Patient Details Name: Jacqueline [name] MRN: [id number] DOB: 1928-12-23 Today's Date: 08/06/2019   History of Present Illness  86 y.o. female with medical history significant of paroxysmal A. fib on Eliquis, arthritis of knee, hypertension, hyperlipidemia, GERD, TIA/stroke, chronic diastolic congestive heart failure presented 08/05/19 to emergency department due to right knee pain and generalized weakness. (PT spoke with son, [name], 08/06/19 and he reports the fall pt described on admission actually occurred several years ago. He stated she was having more knee pain and could not rise from sitting on her rollator and they called EMS). CT head negative; MRI brain no acute changes,Rt knee xray no fracture; +UTI   Clinical Impression   Pt admitted with above diagnosis. Patient has considerable rt knee pain and per son was scheduled to see her orthopedic doctor (Dr. Veverly Fells) later this month for ?cortisone injection. He is hopeful she can receive an injection to rt knee while hospitalized. She was unable to fully stand with 1 person providing moderate assistance with use of RW. She insists that she will not go to SNF for therapy and was unsure if family could arrange 24/7 supervision/assist. Spoke with pt's son, [name], and he reports he does live with patient, [place] 1-2 weeks at a time. He stated he could arrange to be home with patient for a few weeks and was not surprised that she adamantly refused to consider SNF.   Pt currently with functional limitations due to the deficits listed below (see PT Problem List). Pt will benefit from skilled PT to increase their independence and safety with mobility to allow discharge to the venue listed below.       Follow Up Recommendations Home health PT;Supervision/Assistance - 24 hour(if pain better controlled and son commits to 24/7 assist)    Equipment Recommendations  Other  (comment)(son requesting medical transport home and time to set it up)    Recommendations for Other Services OT consult     Precautions / Restrictions Precautions Precautions: Fall      Mobility  Bed Mobility Overal bed mobility: Needs Assistance Bed Mobility: Rolling;Sidelying to Sit;Sit to Supine Rolling: Min assist Sidelying to sit: Min assist;HOB elevated   Sit to supine: Supervision   General bed mobility comments: using rail; step by step instructions required supine to sit  Transfers Overall transfer level: Needs assistance Equipment used: Rolling walker (2 wheeled) Transfers: Sit to/from Stand Sit to Stand: Mod assist         General transfer comment: unable to tolerate Rt knee pain as coming up to stand and returned to sitting x 2  Ambulation/Gait             General Gait Details: unable due to pain  Stairs            Wheelchair Mobility    Modified Rankin (Stroke Patients Only)       Balance Overall balance assessment: Needs assistance Sitting-balance support: No upper extremity supported;Feet supported Sitting balance-Leahy Scale: Fair         Standing balance comment: unable to fully stand due to pain                             Pertinent Vitals/Pain Pain Assessment: Faces Faces Pain Scale: Hurts whole lot Pain Location: rt knee Pain Descriptors / Indicators: Grimacing;Guarding;Sharp Pain Intervention(s): Limited activity within patient's tolerance;Monitored during session;Repositioned  Home Living Family/patient expects to be discharged to:: Private residence Living Arrangements: Children Available Help at Discharge: Family;Personal care attendant;Available PRN/intermittently(son stays for days-week and then travels for 1-2 weeks) Type of Home: House Home Access: Level entry     Home Layout: One level Home Equipment: [place] [place] - 4 wheels, wheelchair (manual) Additional Comments: per son, she owns a knee brace  with velcro straps that helps her knee "some"    Prior Function Level of Independence: Needs assistance   Gait / Transfers Assistance Needed: using rollator for ambulation; h/o rt knee pain and cortisone injection by Dr Veverly Fells  ADL's / Homemaking Assistance Needed: has an aide 3hrs/day x 5 days/wk; son stays with her intermittently (also [place]);         Hand Dominance        Extremity/Trunk Assessment   Upper Extremity Assessment Upper Extremity Assessment: Defer to OT evaluation;Generalized weakness    Lower Extremity Assessment Lower Extremity Assessment: Generalized weakness;RLE deficits/detail RLE Deficits / Details: supine pt able to fully flex and extend knee; could not tolerate weight-bearing RLE: Unable to fully assess due to pain    Cervical / Trunk Assessment Cervical / Trunk Assessment: Kyphotic  Communication   Communication: Expressive difficulties(word-finding issues at times (later noted she'd had Haldol))  Cognition Arousal/Alertness: (sleeping initially; easily aroused) Behavior During Therapy: WFL for tasks assessed/performed Overall Cognitive Status: Impaired/Different from baseline(per discussion with son) Area of Impairment: Memory;Attention                   Current Attention Level: Sustained Memory: Decreased short-term memory         General Comments: orientation NT; pt would conversationally drift off topic and then have difficulty finding words      General Comments General comments (skin integrity, edema, etc.): Patient unclear re: if her son could stay with her more to provide 24/7 assistance. Spoke with son and he feels he can do that for 1-2 weeks     Exercises     Assessment/Plan    PT Assessment Patient needs continued PT services  PT Problem List Decreased strength;Decreased activity tolerance;Decreased balance;Decreased mobility;Decreased cognition;Decreased knowledge of use of  DME;Obesity;Pain       PT Treatment Interventions DME instruction;Gait training;Functional mobility training;Therapeutic activities;Therapeutic exercise;Cognitive remediation;Patient/family education    PT Goals (Current goals can be found in the Care Plan section)  Acute Rehab PT Goals Patient Stated Goal: go home; adamant that she will not go to SNF PT Goal Formulation: With patient Time For Goal Achievement: 08/20/19 Potential to Achieve Goals: Good    Frequency Min 3X/week   Barriers to discharge        Co-evaluation               AM-PAC PT "6 Clicks" Mobility  Outcome Measure Help needed turning from your back to your side while in a flat bed without using bedrails?: A Little Help needed moving from lying on your back to sitting on the side of a flat bed without using bedrails?: A Lot Help needed moving to and from a bed to a chair (including a wheelchair)?: Total Help needed standing up from a chair using your arms (e.g., wheelchair or bedside chair)?: Total Help needed to walk in hospital room?: Total Help needed climbing 3-5 steps with a railing? : Total 6 Click Score: 9    End of Session Equipment Utilized During Treatment: Gait belt Activity Tolerance: Patient limited by  pain Patient left: in bed;with call bell/phone within reach;with bed alarm set;Other (comment)(Lab and SLP) Nurse Communication: Mobility status PT Visit Diagnosis: Pain;History of falling (Z91.81);Difficulty in walking, not elsewhere classified (R26.2) Pain - Right/Left: Right Pain - part of body: Knee    Time: [id number] PT Time Calculation (min) (ACUTE ONLY): 38 min   Charges:   PT Evaluation $PT Eval Moderate Complexity: 1 Mod PT Treatments $Therapeutic Activity: 23-37 mins         Arby Barrette, PT Pager [phone]   Rexanne Mano 08/06/2019, 5:26 PM

## 2019-08-06 NOTE — ED Notes (Signed)
Pt transported to MRI. Ativan given in [facility]

## 2019-08-06 NOTE — Evaluation (Signed)
Clinical/Bedside Swallow Evaluation Patient Details  Name: Jacqueline [name] MRN: [id number] Date of Birth: November 11, 1928  Today's Date: 08/06/2019 Time: SLP Start Time (ACUTE ONLY): 1502 SLP Stop Time (ACUTE ONLY): 1519 SLP Time Calculation (min) (ACUTE ONLY): 17 min  Past Medical History:  Past Medical History:  Diagnosis Date  . Allergy, unspecified not elsewhere classified   . Anemia, unspecified   . Anxiety state, unspecified   . Atrial fibrillation with rapid ventricular response ([place]) 03/13/12  . Blood in stool   . Complication of anesthesia    "hard to wake me up after colonoscopy" (03/13/12)  . Degeneration of lumbar or lumbosacral intervertebral disc   . GERD (gastroesophageal reflux disease)   . H/O hiatal hernia   . Hemorrhage of gastrointestinal tract, unspecified   . History of IBS   . Osteoarthrosis, unspecified whether generalized or localized, lower leg   . Other diseases of lung, not elsewhere classified   . Other dyspnea and respiratory abnormality   . Personal history of other diseases of digestive system   . Personal history of peptic ulcer disease   . Unspecified adverse effect of other drug, medicinal and biological substance(995.29)   . Unspecified cerebral artery occlusion with cerebral infarction 10/2008   "mini stroke" denies residual (03/13/12)  . Unspecified essential hypertension   . Unspecified paranoid state    Past Surgical History:  Past Surgical History:  Procedure Laterality Date  . DILATION AND CURETTAGE OF UTERUS  1950's?  . ORIF HUMERUS FRACTURE Right 04/03/2014   Procedure: OPEN REDUCTION INTERNAL FIXATION (ORIF) RIGHT HUMERUS DISTAL FRACTURE;  Surgeon: Roseanne Kaufman, MD;  Location: [facility];  Service: Orthopedics;  Laterality: Right;  . PILONIDAL CYST / SINUS EXCISION  1957  . SHOULDER ARTHROSCOPY  ~ 2000   right   HPI:  Jacqueline [name] is an 86 y.o. female with a past medical history that includes paroxysmal A. fib taking Eliquis, hypertension,  TIA/stroke, chronic diastolic heart failure, arthritis of the knee, anxiety presents to the ED w/ generalized weakness and right knee pain. She presents w/ acute respiratory failure with hypoxia, urinary tract infection, hypokalemia, acute kidney injury superimposed on chronic kidney disease. MRI negative, CXR revealed large hiatal hernia but otherwise negative.   Assessment / Plan / Recommendation Clinical Impression   Pt encountered partially reclined in bed, drinking thin liquids via straw. She was noted with immediate coughing. Pt was readjusted in a fully upright position for the duration of the session. Pt provided prior swallowing hx, but family/visitors were not present for session to confirm accuracy. Pt stated that someone (?possibly home aide) began thickening her liquids within the past 6 months d/t coughing. Pt was provided thin liquids, purees and regular solids. She was observed with chronic throat clearing before, during and after administering POs; suspect this may be related to hiatal hernia - not aspiration. No other significant coughing episodes were observed. She also was observed with frequent burping episodes during and after POs. Her mastication is prolonged, but functional, as she is able to clear her oral cavity with each bite. Considering pt's most recent MRI is negative and CXR only revealed large hiatal hernia, recommend regular diet and thin liquids. Pt does not need thickened liquids at this time. She may also benefit from an esophogram or GI consult to further assess. SLP will continue to follow briefly.   SLP Visit Diagnosis: Dysphagia, unspecified (R13.10)    Aspiration Risk  Mild aspiration risk    Diet Recommendation Regular;Thin liquid  Liquid Administration via: Straw;Cup Medication Administration: Whole meds with liquid Supervision: Patient able to self feed;Intermittent supervision to cue for compensatory strategies Compensations: Minimize environmental  distractions;Slow rate;Small sips/bites Postural Changes: Seated upright at 90 degrees    Other  Recommendations Recommended Consults: Consider esophageal assessment;Consider GI evaluation Oral Care Recommendations: Oral care BID   Follow up Recommendations 24 hour supervision/assistance;Other (comment)(TBA)      Frequency and Duration min 1 x/week  2 weeks       Prognosis Prognosis for Safe Diet Advancement: Good      Swallow Study   General HPI: Jacqueline [name] is an 86 y.o. female with a past medical history that includes paroxysmal A. fib taking Eliquis, hypertension, TIA/stroke, chronic diastolic heart failure, arthritis of the knee, anxiety presents to the ED w/ generalized weakness and right knee pain. She presents w/ acute respiratory failure with hypoxia, urinary tract infection, hypokalemia, acute kidney injury superimposed on chronic kidney disease. MRI negative, CXR revealed large hiatal hernia but otherwise negative. Type of Study: Bedside Swallow Evaluation Previous Swallow Assessment: 2019 Diet Prior to this Study: Regular;Thin liquids Temperature Spikes Noted: No Respiratory Status: Room air Behavior/Cognition: Alert;Cooperative Oral Cavity Assessment: Within Functional Limits Oral Care Completed by SLP: No Oral Cavity - Dentition: Adequate natural dentition Vision: Functional for self-feeding Self-Feeding Abilities: Able to feed self Patient Positioning: Upright in bed Baseline Vocal Quality: Normal    Oral/Motor/Sensory Function Overall Oral Motor/Sensory Function: Within functional limits   Ice Chips Ice chips: Not tested   Thin Liquid Thin Liquid: Impaired Presentation: Straw Pharyngeal  Phase Impairments: Throat Clearing - Delayed    Nectar Thick Nectar Thick Liquid: Not tested   Honey Thick Honey Thick Liquid: Not tested   Puree Puree: Within functional limits   Solid     Aline August, Student SLP Office: [phone]   Solid: Impaired Presentation:  Self Fed Oral Phase Functional Implications: Prolonged oral transit Pharyngeal Phase Impairments: Throat Clearing - Delayed     08/06/2019,4:02 PM

## 2019-08-06 NOTE — ED Notes (Signed)
Lunch Tray Ordered @ 1052.  

## 2019-08-07 DIAGNOSIS — M25561 Pain in right knee: Secondary | ICD-10-CM

## 2019-08-07 LAB — BASIC METABOLIC PANEL
Anion gap: 10 (ref 5–15)
BUN: 18 mg/dL (ref 8–23)
CO2: 25 mmol/L (ref 22–32)
Calcium: 9.8 mg/dL (ref 8.9–10.3)
Chloride: 105 mmol/L (ref 98–111)
Creatinine, Ser: 0.83 mg/dL (ref 0.44–1.00)
GFR calc Af Amer: >60 mL/min (ref >60)
GFR calc non Af Amer: >60 mL/min (ref >60)
Glucose, Bld: 114 mg/dL — ABNORMAL HIGH (ref 70–99)
Potassium: 4.6 mmol/L (ref 3.5–5.1)
Sodium: 140 mmol/L (ref 135–145)

## 2019-08-07 LAB — CBC
HCT: 47.6 % — ABNORMAL HIGH (ref 36.0–46.0)
Hemoglobin: 15.9 g/dL — ABNORMAL HIGH (ref 12.0–15.0)
MCH: 33.7 pg (ref 26.0–34.0)
MCHC: 33.4 g/dL (ref 30.0–36.0)
MCV: 100.8 fL — ABNORMAL HIGH (ref 80.0–100.0)
Platelets: 172 10*3/uL (ref 150–400)
RBC: 4.72 MIL/uL (ref 3.87–5.11)
RDW: 14.1 % (ref 11.5–15.5)
WBC: 7.4 10*3/uL (ref 4.0–10.5)
nRBC: 0 % (ref 0.0–0.2)

## 2019-08-07 MED ORDER — BUPIVACAINE HCL (PF) 0.5 % IJ SOLN
10.0000 mL | Freq: Once | INTRAMUSCULAR | Status: AC
  Administered 2019-08-07: 10 mL
  Filled 2019-08-07: qty 10

## 2019-08-07 MED ORDER — CEPHALEXIN 500 MG PO CAPS: 500.0000 mg | ORAL_CAPSULE | Freq: Two times a day (BID) | ORAL | 0 refills | Status: AC

## 2019-08-07 MED ORDER — METHYLPREDNISOLONE ACETATE 80 MG/ML IJ SUSP
80.0000 mg | Freq: Once | INTRAMUSCULAR | Status: AC
  Administered 2019-08-07: 80 mg via INTRA_ARTICULAR
  Filled 2019-08-07: qty 1

## 2019-08-07 MED ORDER — FUROSEMIDE 40 MG PO TABS
40.0000 mg | ORAL_TABLET | Freq: Every day | ORAL | Status: DC
  Administered 2019-08-07: 40 mg via ORAL
  Filled 2019-08-07: qty 1

## 2019-08-07 NOTE — Progress Notes (Signed)
Physical Therapy Treatment Patient Details Name: Jacqueline [name] MRN: [id number] DOB: [birth year] Today's Date: 08/07/2019    History of Present Illness 86 y.o. female with medical history significant of paroxysmal A. fib on Eliquis, arthritis of knee, hypertension, hyperlipidemia, GERD, TIA/stroke, chronic diastolic congestive heart failure presented 08/05/19 to emergency department due to right knee pain and generalized weakness. (PT spoke with son, [name], 08/06/19 and he reports the fall pt described on admission actually occurred several years ago. He stated she was having more knee pain and could not rise from sitting on her rollator and they called EMS). CT head negative; MRI brain no acute changes,Rt knee xray no fracture; +UTI     PT Comments    Pt needed maximal encouragement to participate.  Pt talking about wanting to go home, but showing inability to do any of the necessary tasks to be safe at home without assist.  Emphasis on transition to EOB, sitting balance, scooting, sit to stand and pivot to the chair.  Pt confused, tangential and slow to process.  She needed frequent redirection.    Follow Up Recommendations  SNF;Supervision/Assistance - 24 hour;Other (comment)(HHPT only if son can commit to 24/7 care.)     Equipment Recommendations  None recommended by PT    Recommendations for Other Services       Precautions / Restrictions Precautions Precautions: Fall    Mobility  Bed Mobility Overal bed mobility: Needs Assistance Bed Mobility: Supine to Sit Rolling: Mod assist         General bed mobility comments: unable to get to EOB without assistance. Pt states she is normally able to do this.  Transfers Overall transfer level: Needs assistance   Transfers: Sit to/from Stand;Stand Pivot Transfers Sit to Stand: +2 safety/equipment;Mod assist Stand pivot transfers: Mod assist;+2 safety/equipment       General transfer comment: Only able to take 2 steps with max  encouragement to chair; poor safety during transfer.  Initial stand and attempt to transfer with assist on each side aborted in favor of face to face assist  Ambulation/Gait             General Gait Details: unable    Stairs             Wheelchair Mobility    Modified Rankin (Stroke Patients Only)       Balance Overall balance assessment: Needs assistance   Sitting balance-Leahy Scale: Fair       Standing balance-Leahy Scale: Poor                              Cognition Arousal/Alertness: Awake/alert Behavior During Therapy: Anxious Overall Cognitive Status: No family/caregiver present to determine baseline cognitive functioning Area of Impairment: Orientation;Attention;Memory;Following commands;Safety/judgement;Awareness;Problem solving                 Orientation Level: Disoriented to;Time Current Attention Level: Sustained Memory: Decreased recall of precautions;Decreased short-term memory Following Commands: Follows one step commands with increased time Safety/Judgement: Decreased awareness of safety;Decreased awareness of deficits Awareness: Emergent Problem Solving: Slow processing;Decreased initiation;Requires verbal cues General Comments: tangential; difficulty staying on topic;      Exercises      General Comments General comments (skin integrity, edema, etc.): Discussed at length with pt that we (therapies) do not feel that pt has enough consistent assist to be at home by herself, not from her son or from her paid caregiver ?Peter Congo.  Pertinent Vitals/Pain Pain Assessment: Faces Faces Pain Scale: Hurts whole lot Pain Location: rt knee Pain Descriptors / Indicators: Grimacing;Guarding;Sharp Pain Intervention(s): Limited activity within patient's tolerance    Home Living Family/patient expects to be discharged to:: Private residence Living Arrangements: Children Available Help at Discharge: Family;Personal care  attendant;Available PRN/intermittently Type of Home: House Home Access: Level entry   Home Layout: One level Home Equipment: Walker - 4 wheels;Wheelchair - manual      Prior Function Level of Independence: Needs assistance  Gait / Transfers Assistance Needed: using rollator for ambulation; h/o rt knee pain and cortisone injection by Dr Veverly Fells ADL's / Homemaking Assistance Needed: has an aide 3days/wk; son stays with her intermittently ([place]);      PT Goals (current goals can now be found in the care plan section) Acute Rehab PT Goals Patient Stated Goal: to go home PT Goal Formulation: With patient Time For Goal Achievement: 08/20/19 Potential to Achieve Goals: Fair Progress towards PT goals: Progressing toward goals(expect slow progress due to pt's apathy toward activity)    Frequency    Min 3X/week      PT Plan Current plan remains appropriate    Co-evaluation              AM-PAC PT "6 Clicks" Mobility   Outcome Measure  Help needed turning from your back to your side while in a flat bed without using bedrails?: A Lot Help needed moving from lying on your back to sitting on the side of a flat bed without using bedrails?: A Lot Help needed moving to and from a bed to a chair (including a wheelchair)?: A Lot Help needed standing up from a chair using your arms (e.g., wheelchair or bedside chair)?: A Lot Help needed to walk in hospital room?: Total Help needed climbing 3-5 steps with a railing? : Total 6 Click Score: 10    End of Session Equipment Utilized During Treatment: Gait belt Activity Tolerance: Patient limited by fatigue Patient left: in chair;with call bell/phone within reach;with chair alarm set;Other (comment)(lift pad under pt.) Nurse Communication: Mobility status PT Visit Diagnosis: Other abnormalities of gait and mobility (R26.89);Muscle weakness (generalized) (M62.81);History of falling  (Z91.81);Difficulty in walking, not elsewhere classified (R26.2)     Time: [id number] PT Time Calculation (min) (ACUTE ONLY): 35 min  Charges:  $Therapeutic Activity: 8-22 mins                     08/07/2019  Ginger Carne., PT Acute Rehabilitation Services [phone]  (pager) [phone]  (office)   Tessie Fass Kaylin Schellenberg 08/07/2019, 5:34 PM

## 2019-08-07 NOTE — Procedures (Signed)
Procedure: Right knee injection  Indication: Right knee osteoarthritis  Surgeon: Silvestre Gunner, PA-C  Assist: None  Anesthesia: Topical refrigerant  EBL: None  Complications: None  Findings: After risks/benefits explained patient desires to undergo procedure. Consent obtained and time out performed. The right knee was sterilely prepped and 67ml 0.5% Marcaine and 80mg  depomedrol instilled. Pt tolerated the procedure well.    Lisette Abu, PA-C Orthopedic Surgery [phone]

## 2019-08-07 NOTE — Progress Notes (Signed)
Occupational Therapy Evaluation Patient Details Name: Jacqueline [name] MRN: [id number] DOB: 22-Jan-1929 Today's Date: 08/07/2019    History of Present Illness 86 y.o. female with medical history significant of paroxysmal A. fib on Eliquis, arthritis of knee, hypertension, hyperlipidemia, GERD, TIA/stroke, chronic diastolic congestive heart failure presented 08/05/19 to emergency department due to right knee pain and generalized weakness. (PT spoke with son, [name], 08/06/19 and he reports the fall pt described on admission actually occurred several years ago. He stated she was having more knee pain and could not rise from sitting on her rollator and they called EMS). CT head negative; MRI brain no acute changes,Rt knee xray no fracture; +UTI    Clinical Impression   PTA, pt lived at home and had PCA 3 days/wk who assisted with ADL. Pt states she was able to mobilize to the bathroom from her bed and complete her basic ADL tasks. Pt currently requires mod A +2 for safety for limited mobility to chair and Mod A with ADL. Pt very tangential and has difficulty accurately communicating how much her son will be able to help. PT apparently spoke with son yesterday over the phone and he stated he could assist for 1-2 weeks. Pt would benefit from rehab at SNF. If pt declines SNF, will need HHOT and 24/7 physical assist.  Will follow acutely.     Follow Up Recommendations  SNF;Supervision/Assistance - 24 hour    Equipment Recommendations  3 in 1 bedside commode    Recommendations for Other Services       Precautions / Restrictions Precautions Precautions: Fall      Mobility Bed Mobility Overal bed mobility: Needs Assistance Bed Mobility: Supine to Sit Rolling: Mod assist         General bed mobility comments: unable to get to EOB without assistance. Pt staets she is normally able to do this  Transfers Overall transfer level: Needs assistance   Transfers: Sit to/from Stand;Stand Pivot Transfers Sit  to Stand: +2 safety/equipment;Mod assist Stand pivot transfers: Mod assist;+2 safety/equipment       General transfer comment: Only able to take 2 steps with max encouragement to chair; poor safety during transfer    Balance Overall balance assessment: Needs assistance   Sitting balance-Leahy Scale: Fair       Standing balance-Leahy Scale: Poor                             ADL either performed or assessed with clinical judgement   ADL Overall ADL's : Needs assistance/impaired     Grooming: Set up;Sitting   Upper Body Bathing: Set up;Sitting   Lower Body Bathing: Moderate assistance;Sit to/from stand   Upper Body Dressing : Minimal assistance;Sitting   Lower Body Dressing: Moderate assistance;Sit to/from stand   Toilet Transfer: Moderate assistance;+2 for safety/equipment;RW   Toileting- Clothing Manipulation and Hygiene: Maximal assistance Toileting - Clothing Manipulation Details (indicate cue type and reason): incontinent of urine     Functional mobility during ADLs: Moderate assistance;Rolling walker;Cueing for safety       Vision         Perception     Praxis      Pertinent Vitals/Pain Pain Assessment: Faces Faces Pain Scale: Hurts whole lot Pain Location: rt knee Pain Descriptors / Indicators: Grimacing;Guarding;Sharp Pain Intervention(s): Limited activity within patient's tolerance     Hand Dominance Right   Extremity/Trunk Assessment Upper Extremity Assessment Upper Extremity Assessment: Generalized weakness   Lower Extremity  Assessment Lower Extremity Assessment: Defer to PT evaluation   Cervical / Trunk Assessment Cervical / Trunk Assessment: Kyphotic   Communication     Cognition Arousal/Alertness: Awake/alert Behavior During Therapy: Anxious Overall Cognitive Status: No family/caregiver present to determine baseline cognitive functioning Area of Impairment: Orientation;Attention;Memory;Following  commands;Safety/judgement;Awareness;Problem solving                 Orientation Level: Disoriented to;Time Current Attention Level: Sustained Memory: Decreased recall of precautions;Decreased short-term memory Following Commands: Follows one step commands with increased time Safety/Judgement: Decreased awareness of safety;Decreased awareness of deficits Awareness: Emergent Problem Solving: Slow processing;Decreased initiation;Requires verbal cues General Comments: tangential; difficulty staying on topic;   General Comments       Exercises     Shoulder Instructions      Home Living Family/patient expects to be discharged to:: Private residence Living Arrangements: Children Available Help at Discharge: Family;Personal care attendant;Available PRN/intermittently Type of Home: House Home Access: Level entry     Home Layout: One level     Bathroom Shower/Tub: Occupational psychologist: Handicapped height Bathroom Accessibility: Yes How Accessible: Accessible via walker Home Equipment: [place] - 4 wheels;Wheelchair - manual          Prior Functioning/Environment Level of Independence: Needs assistance  Gait / Transfers Assistance Needed: using rollator for ambulation; h/o rt knee pain and cortisone injection by Dr Veverly Fells ADL's / Homemaking Assistance Needed: has an aide 3days/wk; son stays with her intermittently (also [place]);             OT Problem List: Decreased strength;Decreased range of motion;Decreased activity tolerance;Impaired balance (sitting and/or standing);Decreased cognition;Decreased safety awareness;Decreased knowledge of use of DME or AE;Obesity;Pain      OT Treatment/Interventions: Self-care/ADL training;Therapeutic exercise;DME and/or AE instruction;Therapeutic activities;Cognitive remediation/compensation;Patient/family education;Balance training    OT Goals(Current goals can be found in the  care plan section) Acute Rehab OT Goals Patient Stated Goal: to go home OT Goal Formulation: With patient Time For Goal Achievement: 08/21/19 Potential to Achieve Goals: Good  OT Frequency: Min 2X/week   Barriers to D/C:    unsure of caregiver support; per PT note yesterday son able to stay 1-2 weeks       Co-evaluation              AM-PAC OT "6 Clicks" Daily Activity     Outcome Measure Help from another person eating meals?: None Help from another person taking care of personal grooming?: A Little Help from another person toileting, which includes using toliet, bedpan, or urinal?: A Lot Help from another person bathing (including washing, rinsing, drying)?: A Lot Help from another person to put on and taking off regular upper body clothing?: A Little Help from another person to put on and taking off regular lower body clothing?: A Lot 6 Click Score: 16   End of Session Equipment Utilized During Treatment: Gait belt;Rolling walker Nurse Communication: Mobility status  Activity Tolerance: Patient tolerated treatment well Patient left: in chair;with call bell/phone within reach;with chair alarm set  OT Visit Diagnosis: Unsteadiness on feet (R26.81);Other abnormalities of gait and mobility (R26.89);Muscle weakness (generalized) (M62.81);Other symptoms and signs involving cognitive function;Pain Pain - Right/Left: Right Pain - part of body: Knee(general complaints of pain)                Time: [id number] OT Time Calculation (min): 23 min Charges:  OT General Charges $OT Visit: 1 Visit OT Evaluation $OT  Eval Moderate Complexity: [place], OT/L   Acute OT Clinical Specialist Acute Rehabilitation Services Pager [phone] Office [phone]   [facility] 08/07/2019, 3:11 PM

## 2019-08-07 NOTE — Discharge Instructions (Signed)
Take medications as prescribed Follow up with primary orthopedic MD in 2-3 weeks for evaluation of chronic right knee pain Follow up with PCP 1-2 weeks for evaluation of UTI and consideration of OP GI referral for Gerd/hiatal hernia and possible esophagram Dont need thickener for liquids per speech therapy recommendations

## 2019-08-07 NOTE — TOC Initial Note (Addendum)
Transition of Care [facility]) - Initial/Assessment Note    Patient Details  Name: Jacqueline [name] MRN: [id number] Date of Birth: Feb 22, 1929  Transition of Care [facility]) CM/SW Contact:    Marilu Favre, RN Phone Number: 08/07/2019, 12:57 PM  Clinical Narrative:                  Discussed  PT recommendations with patient at bedside and son [name] via phone.   [name] is able to provide 24 hour assistance. Patient refusing home health PT. [name] aware . [name] still in agreement for patient to be discharged to home. He will call the transport company they normally use to see if they can transport patient home today. [name] has [employer] direct number. If transport company unavailable to transport today he would like [facility]. Confirmed address with [name]. Awaiting call back.    Dr Eliseo Squires aware   Josph Macho called back. He would like [facility] to transport his mother home today. Confirmed address. Nurse requested [facility] to be arranged for 1530. [facility] called paperwork in chart .  Expected Discharge Plan: Home/Self Care Barriers to Discharge: No Barriers Identified   Patient Goals and CMS Choice Patient states their goals for this hospitalization and ongoing recovery are:: to go home CMS Medicare.gov Compare Post Acute Care list provided to:: Patient Choice offered to / list presented to : Adult Children, Patient  Expected Discharge Plan and Services Expected Discharge Plan: Home/Self Care   Discharge Planning Services: CM Consult Post Acute Care Choice: [place] arrangements for the past 2 months: Single Family Home Expected Discharge Date: 08/07/19               DME Arranged: N/A         HH Arranged: Refused HH          Prior Living Arrangements/Services Living arrangements for the past 2 months: Single Family Home Lives with:: Adult Children Patient language and need for interpreter reviewed:: Yes Do you feel safe going back to the place where you live?: Yes      Need for Family  Participation in Patient Care: Yes (Comment) Care giver support system in place?: Yes (comment)   Criminal Activity/Legal Involvement Pertinent to Current Situation/Hospitalization: No - Comment as needed  Activities of Daily Living      Permission Sought/Granted   Permission granted to share information with : Yes, Verbal Permission Granted  Share Information with NAME: Josph Macho son           Emotional Assessment Appearance:: Appears stated age Attitude/Demeanor/Rapport: Engaged Affect (typically observed): Accepting Orientation: : Oriented to Self, Oriented to Place, Oriented to  Time, Oriented to Situation Alcohol / Substance Use: Not Applicable Psych Involvement: No (comment)  Admission diagnosis:  Urinary frequency [R35.0] UTI (urinary tract infection) [N39.0] Weakness [R53.1] Pain [R52] Nonintractable headache, unspecified chronicity pattern, unspecified headache type [R51.9] Fatigue, unspecified type [R53.83] Patient Active Problem List   Diagnosis Date Noted  . Generalized weakness 08/05/2019  . Macrocytosis 08/05/2019  . Acute on chronic kidney failure ([place]) 08/05/2019  . Acute respiratory failure with hypoxia ([place]) 12/24/2018  . Acute on chronic diastolic CHF (congestive heart failure) ([place]) 12/24/2018  . Dyspnea 12/24/2018  . Chronic knee pain 12/24/2018  . History of multiple cerebrovascular accidents (CVAs) 12/24/2018  . GERD (gastroesophageal reflux disease) 11/04/2017  . Altered mental status 11/03/2017  . CVA (cerebral vascular accident) ([place]) 10/25/2017  . Benign essential HTN   . History of TIA (transient ischemic attack)   . TIA (  transient ischemic attack) 10/24/2017  . Cerebral infarction due to embolism of right middle cerebral artery ([place])   . HLD (hyperlipidemia)   . Acute ischemic right MCA stroke ([place]) 06/18/2014  . Facial droop   . Displaced fracture of shaft of left humerus 04/03/2014  . Right arm fracture 04/01/2014  . Displaced spiral  fracture of shaft of humerus   . Peripheral arterial disease ([place]) 10/03/2013  . Chronic diastolic CHF (congestive heart failure) ([place]) 08/08/2013  . Chronic venous insufficiency 04/02/2013  . UTI (urinary tract infection) 01/13/2013  . Hypokalemia 01/13/2013  . PAF (paroxysmal atrial fibrillation) ([place]) 10/03/2012  . CAD (coronary artery disease) of artery bypass graft 04/05/2012  . Atrial fibrillation with RVR ([place]) 03/13/2012  . Need for prophylactic vaccination and inoculation against influenza 02/14/2012  . ABDOMINAL WALL HERNIA 01/21/2010  . PARESTHESIA 05/04/2009  . SKIN LESION 01/28/2009  . Nocturia 01/28/2009  . DYSPNEA 01/01/2009  . CVA (cerebral infarction) 11/17/2008  . ALLERGY 10/27/2008  . PAROXYSMAL NOCTURNAL DYSPNEA 10/03/2008  . Osteoarthrosis, unspecified whether generalized or localized, involving lower leg 09/03/2008  . RESTRICTIVE LUNG DISEASE 05/26/2008  . Symptomatic anemia 01/08/2008  . UNS ADVRS EFF OTH RX MEDICINAL&BIOLOGICAL SBSTNC 11/09/2007  . HEMATOCHEZIA 07/18/2007  . PARANOIA 06/01/2007  . Anxiety 01/23/2007  . Hypertensive heart disease with CHF ([place]) 01/23/2007  . GI BLEEDING 01/23/2007  . Osteoarthritis 01/23/2007  . DEGENERATIVE DISC DISEASE, LUMBAR SPINE 01/23/2007  . History of peptic ulcer disease 01/23/2007  . Personal history of other diseases of digestive system 01/23/2007   PCP:  Lajean Manes, MD Pharmacy:   [facility], [place] [address] Phone: [phone] Fax: [phone]     Social Determinants of Health (SDOH) Interventions    Readmission Risk Interventions No flowsheet data found.

## 2019-08-07 NOTE — Consult Note (Signed)
Reason for Consult:Right knee pain Referring Physician: [name] is an 86 y.o. female.  HPI: Jacqueline [name] was admitted 2d ago with right knee pain and generalized weakness. X-rays were negative. She has known tricompartmental arthritis in that knee and sees Dr. Lorin Mercy. She has had several injections in that knee with benefit. As she was having trouble ambulating we were asked to see her in consult for a steroid injection.  Past Medical History:  Diagnosis Date  . Allergy, unspecified not elsewhere classified   . Anemia, unspecified   . Anxiety state, unspecified   . Atrial fibrillation with rapid ventricular response ([place]) 03/13/12  . Blood in stool   . Complication of anesthesia    "hard to wake me up after colonoscopy" (03/13/12)  . Degeneration of lumbar or lumbosacral intervertebral disc   . GERD (gastroesophageal reflux disease)   . H/O hiatal hernia   . Hemorrhage of gastrointestinal tract, unspecified   . History of IBS   . Osteoarthrosis, unspecified whether generalized or localized, lower leg   . Other diseases of lung, not elsewhere classified   . Other dyspnea and respiratory abnormality   . Personal history of other diseases of digestive system   . Personal history of peptic ulcer disease   . Unspecified adverse effect of other drug, medicinal and biological substance(995.29)   . Unspecified cerebral artery occlusion with cerebral infarction 10/2008   "mini stroke" denies residual (03/13/12)  . Unspecified essential hypertension   . Unspecified paranoid state     Past Surgical History:  Procedure Laterality Date  . DILATION AND CURETTAGE OF UTERUS  1950's?  . ORIF HUMERUS FRACTURE Right 04/03/2014   Procedure: OPEN REDUCTION INTERNAL FIXATION (ORIF) RIGHT HUMERUS DISTAL FRACTURE;  Surgeon: Roseanne Kaufman, MD;  Location: [facility];  Service: Orthopedics;  Laterality: Right;  . PILONIDAL CYST / SINUS EXCISION  1957  . SHOULDER ARTHROSCOPY  ~ 2000   right    Family  History  Problem Relation Age of Onset  . Diabetes Mother   . CVA Mother   . CVA Father   . CVA Maternal Grandmother     Social History:  reports that she quit smoking about 17 years ago. Her smoking use included cigarettes. She has a 40.00 pack-year smoking history. She has never used smokeless tobacco. She reports that she does not drink alcohol or use drugs.  Allergies:  Allergies  Allergen Reactions  . Metoclopramide Hcl Other (See Comments)     Tremors  . Penicillins Itching    Has patient had a PCN reaction causing immediate rash, facial/tongue/throat swelling, SOB or lightheadedness with hypotension: Yes Has patient had a PCN reaction causing severe rash involving mucus membranes or skin necrosis: Yes Has patient had a PCN reaction that required hospitalization:No Has patient had a PCN reaction occurring within the last 10 years: No If all of the above answers are "NO", then may proceed with Cephalosporin use.   . Aspirin Other (See Comments)    "gave me stomach aches; made me bleed" (03/13/12)  . Zetia [Ezetimibe] Other (See Comments)    Stomach and back pain    Medications: I have reviewed the patient's current medications.  Results for orders placed or performed during the hospital encounter of 08/05/19 (from the past 48 hour(s))  CBG monitoring, ED     Status: Abnormal   Collection Time: 08/05/19  2:12 PM  Result Value Ref Range   Glucose-Capillary 111 (H) 70 - 99 mg/dL  Comment: Glucose reference range applies only to samples taken after fasting for at least 8 hours.  CBC with Differential     Status: Abnormal   Collection Time: 08/05/19  2:38 PM  Result Value Ref Range   WBC 6.7 4.0 - 10.5 K/uL   RBC 4.62 3.87 - 5.11 MIL/uL   Hemoglobin 15.3 (H) 12.0 - 15.0 g/dL   HCT 46.7 (H) 36.0 - 46.0 %   MCV 101.1 (H) 80.0 - 100.0 fL   MCH 33.1 26.0 - 34.0 pg   MCHC 32.8 30.0 - 36.0 g/dL   RDW 14.4 11.5 - 15.5 %   Platelets 190 150 - 400 K/uL   nRBC 0.0 0.0 - 0.2 %    Neutrophils Relative % 73 %   Neutro Abs 4.9 1.7 - 7.7 K/uL   Lymphocytes Relative 17 %   Lymphs Abs 1.2 0.7 - 4.0 K/uL   Monocytes Relative 9 %   Monocytes Absolute 0.6 0.1 - 1.0 K/uL   Eosinophils Relative 1 %   Eosinophils Absolute 0.1 0.0 - 0.5 K/uL   Basophils Relative 0 %   Basophils Absolute 0.0 0.0 - 0.1 K/uL   Immature Granulocytes 0 %   Abs Immature Granulocytes 0.02 0.00 - 0.07 K/uL    Comment: Performed at [facility], [address], [place], [place] [address]  Comprehensive metabolic panel     Status: Abnormal   Collection Time: 08/05/19  2:38 PM  Result Value Ref Range   Sodium 144 135 - 145 mmol/L   Potassium 3.6 3.5 - 5.1 mmol/L   Chloride 103 98 - 111 mmol/L   CO2 30 22 - 32 mmol/L   Glucose, Bld 105 (H) 70 - 99 mg/dL    Comment: Glucose reference range applies only to samples taken after fasting for at least 8 hours.   BUN 27 (H) 8 - 23 mg/dL   Creatinine, Ser 1.13 (H) 0.44 - 1.00 mg/dL   Calcium 9.8 8.9 - 10.3 mg/dL   Total Protein 6.5 6.5 - 8.1 g/dL   Albumin 3.7 3.5 - 5.0 g/dL   AST 19 15 - 41 U/L   ALT 10 0 - 44 U/L   Alkaline Phosphatase 65 38 - 126 U/L   Total Bilirubin 0.6 0.3 - 1.2 mg/dL   GFR calc non Af Amer 43 (L) >60 mL/min   GFR calc Af Amer 50 (L) >60 mL/min   Anion gap 11 5 - 15    Comment: Performed at [facility] [address], [place], [place] [address]  TSH     Status: None   Collection Time: 08/05/19  3:08 PM  Result Value Ref Range   TSH 1.780 0.350 - 4.500 uIU/mL    Comment: Performed by a 3rd Generation assay with a functional sensitivity of <=0.01 uIU/mL. Performed at [facility], [place] [address], [place],  [address]   Urinalysis, Routine w reflex microscopic     Status: Abnormal   Collection Time: 08/05/19  3:33 PM  Result Value Ref Range   Color, Urine YELLOW YELLOW   APPearance HAZY (A) CLEAR   Specific Gravity, Urine 1.017 1.005 - 1.030   pH 5.0 5.0 - 8.0   Glucose, UA NEGATIVE NEGATIVE  mg/dL   Hgb urine dipstick NEGATIVE NEGATIVE   Bilirubin Urine NEGATIVE NEGATIVE   Ketones, ur NEGATIVE NEGATIVE mg/dL   Protein, ur NEGATIVE NEGATIVE mg/dL   Nitrite POSITIVE (A) NEGATIVE   Leukocytes,Ua TRACE (A) NEGATIVE  RBC / HPF 0-5 0 - 5 RBC/hpf   WBC, UA 11-20 0 - 5 WBC/hpf   Bacteria, UA MANY (A) NONE SEEN   Squamous Epithelial / LPF 0-5 0 - 5   Mucus PRESENT    Hyaline Casts, UA PRESENT     Comment: Performed at [facility], [place] [address], [place], [place] [address]  Urine culture     Status: Abnormal (Preliminary result)   Collection Time: 08/05/19  3:33 PM   Specimen: Urine, Random  Result Value Ref Range   Specimen Description URINE, RANDOM    Special Requests NONE    Culture (A)     >=100,000 COLONIES/mL ESCHERICHIA COLI CULTURE REINCUBATED FOR BETTER GROWTH Performed at [facility], [place] [address], [place],  [address]    Report Status PENDING    Organism ID, Bacteria ESCHERICHIA COLI (A)       Susceptibility   Escherichia coli - MIC*    AMPICILLIN <=2 SENSITIVE Sensitive     CEFAZOLIN <=4 SENSITIVE Sensitive     CEFTRIAXONE <=0.25 SENSITIVE Sensitive     CIPROFLOXACIN <=0.25 SENSITIVE Sensitive     GENTAMICIN <=1 SENSITIVE Sensitive     IMIPENEM <=0.25 SENSITIVE Sensitive     NITROFURANTOIN <=16 SENSITIVE Sensitive     TRIMETH/SULFA <=20 SENSITIVE Sensitive     AMPICILLIN/SULBACTAM <=2 SENSITIVE Sensitive     PIP/TAZO <=4 SENSITIVE Sensitive     * >=100,000 COLONIES/mL ESCHERICHIA COLI  Vitamin B12     Status: Abnormal   Collection Time: 08/05/19  6:30 PM  Result Value Ref Range   Vitamin B-12 996 (H) 180 - 914 pg/mL    Comment: (NOTE) This assay is not validated for testing neonatal or myeloproliferative syndrome specimens for Vitamin B12 levels. Performed at [facility], [place] [address], [place], [place] [address]   SARS CORONAVIRUS 2 (TAT 6-24 HRS) Nasopharyngeal Nasopharyngeal Swab     Status: None   Collection Time:  08/05/19  6:39 PM   Specimen: Nasopharyngeal Swab  Result Value Ref Range   SARS Coronavirus 2 NEGATIVE NEGATIVE    Comment: (NOTE) SARS-CoV-2 target nucleic acids are NOT DETECTED. The SARS-CoV-2 RNA is generally detectable in upper and lower respiratory specimens during the acute phase of infection. Negative results do not preclude SARS-CoV-2 infection, do not rule out co-infections with other pathogens, and should not be used as the sole basis for treatment or other patient management decisions. Negative results must be combined with clinical observations, patient history, and epidemiological information. The expected result is Negative. Fact Sheet for Patients: SugarRoll.be Fact Sheet for Healthcare Providers: https://www.woods-mathews.com/ This test is not yet approved or cleared by the [place] FDA and  has been authorized for detection and/or diagnosis of SARS-CoV-2 by FDA under an Emergency Use Authorization (EUA). This EUA will remain  in effect (meaning this test can be used) for the duration of the COVID-19 declaration under Section 56 4(b)(1) of the Act, 21 U.S.C. section 360bbb-3(b)(1), unless the authorization is terminated or revoked sooner. Performed at [facility], [place] [address], [place], [place] [address]   Folate RBC     Status: None   Collection Time: 08/05/19  7:44 PM  Result Value Ref Range   Folate, Hemolysate >620.0 Not Estab. ng/mL   Hematocrit 43.6 34.0 - 46.6 %   Folate, RBC >1,422 >498 ng/mL    Comment: (NOTE) Performed At: [facility] [place], [place] [id number] Rush Farmer MD  [id number]   Magnesium     Status: None   Collection Time: 08/05/19  7:44 PM  Result Value Ref Range   Magnesium 2.2 1.7 - 2.4 mg/dL    Comment: Performed at [facility], [place] [address] [place], [place] [address]  TSH     Status: None   Collection Time: 08/05/19  7:44 PM  Result  Value Ref Range   TSH 1.945 0.350 - 4.500 uIU/mL    Comment: Performed by a 3rd Generation assay with a functional sensitivity of <=0.01 uIU/mL. Performed at [facility], [place] [address] [place], [place] [address]   Culture, blood (routine x 2)     Status: None (Preliminary result)   Collection Time: 08/05/19  8:19 PM   Specimen: BLOOD  Result Value Ref Range   Specimen Description BLOOD LEFT ANTECUBITAL    Special Requests      BOTTLES DRAWN AEROBIC AND ANAEROBIC Blood Culture adequate volume   Culture      NO GROWTH 2 DAYS Performed at [facility], [place] [address], [place], [place] [address]    Report Status PENDING   Basic metabolic panel     Status: Abnormal   Collection Time: 08/06/19  4:07 AM  Result Value Ref Range   Sodium 145 135 - 145 mmol/L   Potassium 3.3 (L) 3.5 - 5.1 mmol/L   Chloride 104 98 - 111 mmol/L   CO2 31 22 - 32 mmol/L   Glucose, Bld 98 70 - 99 mg/dL    Comment: Glucose reference range applies only to samples taken after fasting for at least 8 hours.   BUN 22 8 - 23 mg/dL   Creatinine, Ser 0.91 0.44 - 1.00 mg/dL   Calcium 9.4 8.9 - 10.3 mg/dL   GFR calc non Af Amer 56 (L) >60 mL/min   GFR calc Af Amer >60 >60 mL/min   Anion gap 10 5 - 15    Comment: Performed at [facility] [address] [place], [place] [address]  CBC     Status: Abnormal   Collection Time: 08/06/19  4:07 AM  Result Value Ref Range   WBC 7.1 4.0 - 10.5 K/uL   RBC 4.39 3.87 - 5.11 MIL/uL   Hemoglobin 14.4 12.0 - 15.0 g/dL   HCT 44.6 36.0 - 46.0 %   MCV 101.6 (H) 80.0 - 100.0 fL   MCH 32.8 26.0 - 34.0 pg   MCHC 32.3 30.0 - 36.0 g/dL   RDW 14.3 11.5 - 15.5 %   Platelets 172 150 - 400 K/uL   nRBC 0.0 0.0 - 0.2 %    Comment: Performed at [facility], [place] [address], [place], [place] [address]  CBG monitoring, ED     Status: None   Collection Time: 08/06/19 11:51 AM  Result Value Ref Range   Glucose-Capillary 93 70 - 99 mg/dL    Comment: Glucose  reference range applies only to samples taken after fasting for at least 8 hours.  Culture, blood (single) w Reflex to ID Panel     Status: None (Preliminary result)   Collection Time: 08/06/19  2:58 PM   Specimen: BLOOD  Result Value Ref Range   Specimen Description BLOOD LEFT ANTECUBITAL    Special Requests      BOTTLES DRAWN AEROBIC AND ANAEROBIC Blood Culture adequate volume   Culture      NO GROWTH < 24 HOURS Performed at [facility],  [address], [place], [place] [address]    Report Status PENDING   Basic metabolic panel     Status: Abnormal   Collection Time: 08/07/19  5:13 AM  Result Value Ref Range   Sodium 140 135 - 145 mmol/L   Potassium 4.6 3.5 - 5.1 mmol/L   Chloride 105 98 - 111 mmol/L   CO2 25 22 - 32 mmol/L   Glucose, Bld 114 (H) 70 - 99 mg/dL    Comment: Glucose reference range applies only to samples taken after fasting for at least 8 hours.   BUN 18 8 - 23 mg/dL   Creatinine, Ser 0.83 0.44 - 1.00 mg/dL   Calcium 9.8 8.9 - 10.3 mg/dL   GFR calc non Af Amer >60 >60 mL/min   GFR calc Af Amer >60 >60 mL/min   Anion gap 10 5 - 15    Comment: Performed at [facility] [address], [place], [place] [address]  CBC     Status: Abnormal   Collection Time: 08/07/19  5:13 AM  Result Value Ref Range   WBC 7.4 4.0 - 10.5 K/uL   RBC 4.72 3.87 - 5.11 MIL/uL   Hemoglobin 15.9 (H) 12.0 - 15.0 g/dL   HCT 47.6 (H) 36.0 - 46.0 %   MCV 100.8 (H) 80.0 - 100.0 fL   MCH 33.7 26.0 - 34.0 pg   MCHC 33.4 30.0 - 36.0 g/dL   RDW 14.1 11.5 - 15.5 %   Platelets 172 150 - 400 K/uL   nRBC 0.0 0.0 - 0.2 %    Comment: Performed at [facility], [place] [address] [place], [place] [address]    CT Head Wo Contrast  Result Date: 08/05/2019 CLINICAL DATA:  Headaches for the last 2 days EXAM: CT HEAD WITHOUT CONTRAST TECHNIQUE: Contiguous axial images were obtained from the base of the skull through the vertex without intravenous contrast. COMPARISON:  11/03/2017  FINDINGS: Brain: No evidence of acute infarction, hemorrhage, extra-axial collection, ventriculomegaly, or mass effect. Generalized cerebral atrophy. Periventricular white matter low attenuation likely secondary to microangiopathy. Vascular: Cerebrovascular atherosclerotic calcifications are noted. Skull: Negative for fracture or focal lesion. Sinuses/Orbits: Visualized portions of the orbits are unremarkable. Visualized portions of the paranasal sinuses and mastoid air cells are unremarkable. Other: None. IMPRESSION: 1. No acute intracranial pathology. 2. Chronic microvascular disease and cerebral atrophy. Electronically Signed   By: Kathreen Devoid   On: 08/05/2019 18:02   MR BRAIN WO CONTRAST  Result Date: 08/06/2019 CLINICAL DATA:  Subacute neuro deficit. Atrial fibrillation on Eliquis. EXAM: MRI HEAD WITHOUT CONTRAST TECHNIQUE: Multiplanar, multiecho pulse sequences of the brain and surrounding structures were obtained without intravenous contrast. COMPARISON:  CT head 08/05/2019 FINDINGS: Brain: Negative for acute infarct Generalized atrophy without hydrocephalus. Chronic small infarcts in the cerebellum bilaterally. Chronic microvascular ischemic change in the white matter bilaterally. Chronic hemorrhage in the central pons. No mass or fluid collection. Vascular: Normal arterial flow voids. Skull and upper cervical spine: Negative Sinuses/Orbits: Mild mucosal edema paranasal sinuses. Left cataract extraction Other: None IMPRESSION: Atrophy and chronic ischemic changes.  No acute infarct. Electronically Signed   By: Franchot Gallo M.D.   On: 08/06/2019 10:15   DG Chest Portable 1 View  Result Date: 08/05/2019 CLINICAL DATA:  Fatigue. Neuro symptoms. Decreased mobility and weakness. EXAM: PORTABLE CHEST 1 VIEW COMPARISON:  12/24/2018 FINDINGS: There is stable enlarged configuration of the heart. Hiatal hernia present. Stable elevation of the RIGHT hemidiaphragm. The lungs  are free of focal consolidations and  pleural effusions. No pulmonary edema. There is atherosclerotic calcification of the thoracic aorta. Chronic changes are seen in both shoulders. IMPRESSION: Stable cardiomegaly.  Large hiatal hernia. Aortic atherosclerosis.  (ICD10-I70.0) Electronically Signed   By: Nolon Nations M.D.   On: 08/05/2019 15:45   DG Knee Complete 4 Views Right  Result Date: 08/05/2019 CLINICAL DATA:  Fall, pain EXAM: RIGHT KNEE - COMPLETE 4+ VIEW COMPARISON:  None. FINDINGS: Moderate tricompartment degenerative changes. No acute bony abnormality. Specifically, no fracture, subluxation, or dislocation. No joint effusion. Vascular calcifications noted. IMPRESSION: Moderate tricompartment degenerative changes. No acute bony abnormality. Electronically Signed   By: Rolm Baptise M.D.   On: 08/05/2019 17:55   DG HIP UNILAT WITH PELVIS 2-3 VIEWS RIGHT  Result Date: 08/05/2019 CLINICAL DATA:  Pain EXAM: DG HIP (WITH OR WITHOUT PELVIS) 2-3V RIGHT COMPARISON:  None. FINDINGS: Moderate degenerative changes in the hips bilaterally, left greater than right. SI joints symmetric and unremarkable. No acute bony abnormality. Specifically, no fracture, subluxation, or dislocation. IMPRESSION: No acute bony abnormality. Electronically Signed   By: Rolm Baptise M.D.   On: 08/05/2019 17:55    Review of Systems  HENT: Negative for ear discharge, ear pain, hearing loss and tinnitus.   Eyes: Negative for photophobia and pain.  Respiratory: Negative for cough and shortness of breath.   Cardiovascular: Negative for chest pain.  Gastrointestinal: Negative for abdominal pain, nausea and vomiting.  Genitourinary: Negative for dysuria, flank pain, frequency and urgency.  Musculoskeletal: Positive for arthralgias (Right knee, bilateral shoulders). Negative for back pain, myalgias and neck pain.  Neurological: Negative for dizziness and headaches.  Hematological: Does not bruise/bleed easily.  Psychiatric/Behavioral: The patient is not  nervous/anxious.    Blood pressure (!) 168/77, pulse 75, temperature 98.1 F (36.7 C), temperature source Oral, resp. rate 18, height 5\' 1"  (1.549 m), weight 79.4 kg, SpO2 94 %. Physical Exam  Constitutional: She appears well-developed and well-nourished. No distress.  HENT:  Head: Normocephalic and atraumatic.  Eyes: Conjunctivae are normal. Right eye exhibits no discharge. Left eye exhibits no discharge. No scleral icterus.  Cardiovascular: Normal rate and regular rhythm.  Respiratory: Effort normal. No respiratory distress.  Musculoskeletal:     Cervical back: Normal range of motion.     Comments: LLE No traumatic wounds, ecchymosis, or rash  Knee mod TTP med/lat  No knee or ankle effusion  Knee stable to varus/ valgus and anterior/posterior stress  Sens DPN, SPN, TN intact  Motor EHL, ext, flex, evers 5/5  DP 1+, PT 0, No significant edema  Neurological: She is alert.  Skin: Skin is warm and dry. She is not diaphoretic.  Psychiatric: She has a normal mood and affect. Her behavior is normal.    Assessment/Plan: Right knee OA -- Injected her knee with Marcaine and depomedrol; hopefully that will provide some relief. Multiple medical problems including paroxysmal A. fib on Eliquis, arthritis of knee, hypertension, hyperlipidemia, GERD, TIA/stroke, chronic diastolic congestive heart failure    Lisette Abu, PA-C Orthopedic Surgery [phone] 08/07/2019, 10:51 AM

## 2019-08-07 NOTE — Discharge Summary (Addendum)
Physician Discharge Summary  Jacqueline [name] [id number] DOB: 1929/04/15 DOA: 08/05/2019  PCP: Lajean Manes, MD  Admit date: 08/05/2019 Discharge date: 08/07/2019  Admitted From: home Discharge disposition: home   Recommendations for Outpatient Follow-Up:   Follow-up with primary orthopedics in 1 to 2 weeks for evaluation of chronic right knee pain status post injection.  Of note patient evaluated by physical therapy and SNF recommended patient and family declined.  Home health PT OT offered and patient declined. Follow-up with primary care provider in 2 to 3 weeks for evaluation of urinary tract infection. Consider OP follow up with GI for evaluation of gerd/hiatal hernia and possible esophogram   Discharge Diagnosis:   Principal Problem:   Acute respiratory failure with hypoxia ([facility]) Active Problems:   UTI (urinary tract infection)   Generalized weakness   Acute on chronic kidney failure (H[facility])   PAF (paroxysmal atrial fibrillation) (HCC)   Hypokalemia   Chronic diastolic CHF (congestive heart failure) (HCC)   Benign essential HTN   Chronic knee pain   History of multiple cerebrovascular accidents (CVAs)   Macrocytosis   Anxiety   CAD (coronary artery disease) of artery bypass graft   HLD (hyperlipidemia)    Discharge Condition: Improved.  Diet recommendation: Low sodium, heart healthy  Wound care: None.  Code status: Full.   History of Present Illness:   Jacqueline [name] is a 86 y.o. female with medical history significant of paroxysmal A. fib on Eliquis, arthritis of knee, hypertension, hyperlipidemia, GERD, TIA/stroke, chronic diastolic congestive heart failure presented on 4/5 to emergency department due to right knee pain and generalized weakness.  Reported generalized weakness and increased urinary frequency for 2 days.  Patient  some difficulty with getting her words out and seemed slightly confused.  Patient reported worsening right leg weakness and today she  was guided to the ground by the family in the bathroom and could not get up due to weakness and right knee pain.   No history of fall, head trauma, seizure, loss of consciousness, headache, blurry vision, chest pain, shortness of breath, palpitation, leg swelling, fever, chills, dysuria, hematuria, foul-smelling urine, lower back pain, lower abdominal pain, nausea, vomiting, decreased appetite, bowel symptoms.   She lives alone at home.  Her son visits her once in 2 weeks   Hospital Course by Problem:   #1.  Acute respiratory failure with hypoxia. Resolved at discharge.  Oxygen saturation level noted to be 85% on room air in the emergency department.  Provided with oxygen supplementation and oxygen saturation level increased to 96%.  Chest x-ray revealed stable cardiomegaly and large hiatal hernia with lungs free from consolidation or pleural effusion no pulmonary edema.Provided incentive spirometry   #2.  Urinary tract infection.  Urinalysis consistent with UTI. Culture with ecoli.  Rocephin provided and patient tolerated. She remained afebrile hemodynamically stable and nontoxic-appearing. Discharge with keflex.   3.  Generalized weakness/right knee pain.  Likely multifactorial in the setting of #1 and 2 as well as known tricompartmental arthritis in right knee. She is also less mobile due to her chronic right knee pain.  She lives alone.  She has an aide that comes Monday through Friday from 10-1. Has had injections in the past with relief. Imaging without acute findings.  Evaluated by physical therapy recommend SNF.  Patient and family refused.  Home health PT offered and patient refused.  Evaluated by orthopedics and steroid injection provided on the day of discharge.  Follow up with primary  orthopedic physician as noted above  #4.  History of multiple CVAs in the past.  At baseline she does some word searching but demonstrated very difficult time with word searchin and appeared somewhat  confused.  Otherwise her neuro exam is benign.  Home medications include Eliquis.  She also demonstrated some throat clearing with conversation and reportd that her aide has noted that she may need "some thickener for her liquids". MRI of the brain negative for acute abnormality. Speech therapy evaluation opined no risk of aspiration but recommended gi consult possible esophogram. ST stated does not need thickener   #5.  Acute kidney injury superimposed on chronic kidney disease stage III.  Creatinine appeared to be a little above baseline on presentation.  She received gentle IV fluids and creatinine is within the limits of normal. -Hold nephrotoxins as able -Monitor urine output   6.  Hypokalemia.  Mild. Resolved at discharge.    #7.  Chronic right knee pain. See # #3. S/p sterioid injection per ortho   #8.  Paroxysmal A. fib.  Home medications include Eliquis and diltiazem.  EKG with sinus rhythm   #9.  Hypertension. controlled   #10.  Macrocytosis.  Hemoglobin is stable.  No sign symptoms of bleeding.  Vitamin B12 level slightly elevated. OP follow up   #11.  Chronic diastolic heart failure.  Euvolemic.  Home medications include Lasix and metoprolol.  Echo done 8 months ago revealed an EF of 60% with mildly increased left ventricular wall thickness no evidence of wall motion abnormalities    Medical Consultants:      Discharge Exam:   Vitals:   08/07/19 0454 08/07/19 1226  BP: (!) 168/77 (!) 144/80  Pulse: 75 100  Resp: 18 16  Temp: 98.1 F (36.7 C) 98.2 F (36.8 C)  SpO2: 94% 96%   Vitals:   08/07/19 0003 08/07/19 0111 08/07/19 0454 08/07/19 1226  BP: (!) 183/97 (!) 161/96 (!) 168/77 (!) 144/80  Pulse: 88  75 100  Resp: 18  18 16   Temp: 98.6 F (37 C)  98.1 F (36.7 C) 98.2 F (36.8 C)  TempSrc: Oral  Oral Oral  SpO2: 94%  94% 96%  Weight:      Height:        General exam: Appears calm and comfortable. No acute distress Respiratory system: Clear to  auscultation. Respiratory effort normal. Cardiovascular system: S1 & S2 heard, RRR. No JVD,  rubs, gallops or clicks. No murmurs. Gastrointestinal system: Abdomen is nondistended, soft and nontender. No organomegaly or masses felt. Normal bowel sounds heard. Central nervous system: Alert and oriented. No focal neurological deficits. Extremities: No clubbing,  or cyanosis. No edema. Skin: No rashes, lesions or ulcers. Psychiatry: Judgement and insight appear normal. Mood & affect appropriate.    The results of significant diagnostics from this hospitalization (including imaging, microbiology, ancillary and laboratory) are listed below for reference.     Procedures and Diagnostic Studies:   CT Head Wo Contrast  Result Date: 08/05/2019 CLINICAL DATA:  Headaches for the last 2 days EXAM: CT HEAD WITHOUT CONTRAST TECHNIQUE: Contiguous axial images were obtained from the base of the skull through the vertex without intravenous contrast. COMPARISON:  11/03/2017 FINDINGS: Brain: No evidence of acute infarction, hemorrhage, extra-axial collection, ventriculomegaly, or mass effect. Generalized cerebral atrophy. Periventricular white matter low attenuation likely secondary to microangiopathy. Vascular: Cerebrovascular atherosclerotic calcifications are noted. Skull: Negative for fracture or focal lesion. Sinuses/Orbits: Visualized portions of the orbits are unremarkable. Visualized portions of  the paranasal sinuses and mastoid air cells are unremarkable. Other: None. IMPRESSION: 1. No acute intracranial pathology. 2. Chronic microvascular disease and cerebral atrophy. Electronically Signed   By: Kathreen Devoid   On: 08/05/2019 18:02   MR BRAIN WO CONTRAST  Result Date: 08/06/2019 CLINICAL DATA:  Subacute neuro deficit. Atrial fibrillation on Eliquis. EXAM: MRI HEAD WITHOUT CONTRAST TECHNIQUE: Multiplanar, multiecho pulse sequences of the brain and surrounding structures were obtained without intravenous  contrast. COMPARISON:  CT head 08/05/2019 FINDINGS: Brain: Negative for acute infarct Generalized atrophy without hydrocephalus. Chronic small infarcts in the cerebellum bilaterally. Chronic microvascular ischemic change in the white matter bilaterally. Chronic hemorrhage in the central pons. No mass or fluid collection. Vascular: Normal arterial flow voids. Skull and upper cervical spine: Negative Sinuses/Orbits: Mild mucosal edema paranasal sinuses. Left cataract extraction Other: None IMPRESSION: Atrophy and chronic ischemic changes.  No acute infarct. Electronically Signed   By: Franchot Gallo M.D.   On: 08/06/2019 10:15   DG Chest Portable 1 View  Result Date: 08/05/2019 CLINICAL DATA:  Fatigue. Neuro symptoms. Decreased mobility and weakness. EXAM: PORTABLE CHEST 1 VIEW COMPARISON:  12/24/2018 FINDINGS: There is stable enlarged configuration of the heart. Hiatal hernia present. Stable elevation of the RIGHT hemidiaphragm. The lungs are free of focal consolidations and pleural effusions. No pulmonary edema. There is atherosclerotic calcification of the thoracic aorta. Chronic changes are seen in both shoulders. IMPRESSION: Stable cardiomegaly.  Large hiatal hernia. Aortic atherosclerosis.  (ICD10-I70.0) Electronically Signed   By: Nolon Nations M.D.   On: 08/05/2019 15:45   DG Knee Complete 4 Views Right  Result Date: 08/05/2019 CLINICAL DATA:  Fall, pain EXAM: RIGHT KNEE - COMPLETE 4+ VIEW COMPARISON:  None. FINDINGS: Moderate tricompartment degenerative changes. No acute bony abnormality. Specifically, no fracture, subluxation, or dislocation. No joint effusion. Vascular calcifications noted. IMPRESSION: Moderate tricompartment degenerative changes. No acute bony abnormality. Electronically Signed   By: Rolm Baptise M.D.   On: 08/05/2019 17:55   DG HIP UNILAT WITH PELVIS 2-3 VIEWS RIGHT  Result Date: 08/05/2019 CLINICAL DATA:  Pain EXAM: DG HIP (WITH OR WITHOUT PELVIS) 2-3V RIGHT COMPARISON:   None. FINDINGS: Moderate degenerative changes in the hips bilaterally, left greater than right. SI joints symmetric and unremarkable. No acute bony abnormality. Specifically, no fracture, subluxation, or dislocation. IMPRESSION: No acute bony abnormality. Electronically Signed   By: Rolm Baptise M.D.   On: 08/05/2019 17:55     Labs:   Basic Metabolic Panel: Recent Labs  Lab 08/05/19 1438 08/05/19 1438 08/05/19 1944 08/06/19 0407 08/07/19 0513  NA 144  --   --  145 140  K 3.6   < >  --  3.3* 4.6  CL 103  --   --  104 105  CO2 30  --   --  31 25  GLUCOSE 105*  --   --  98 114*  BUN 27*  --   --  22 18  CREATININE 1.13*  --   --  0.91 0.83  CALCIUM 9.8  --   --  9.4 9.8  MG  --   --  2.2  --   --    < > = values in this interval not displayed.   GFR Estimated Creatinine Clearance: 43.8 mL/min (by C-G formula based on SCr of 0.83 mg/dL). Liver Function Tests: Recent Labs  Lab 08/05/19 1438  AST 19  ALT 10  ALKPHOS 65  BILITOT 0.6  PROT 6.5  ALBUMIN 3.7  No results for input(s): LIPASE, AMYLASE in the last 168 hours. No results for input(s): AMMONIA in the last 168 hours. Coagulation profile No results for input(s): INR, PROTIME in the last 168 hours.  CBC: Recent Labs  Lab 08/05/19 1438 08/05/19 1944 08/06/19 0407 08/07/19 0513  WBC 6.7  --  7.1 7.4  NEUTROABS 4.9  --   --   --   HGB 15.3*  --  14.4 15.9*  HCT 46.7* 43.6 44.6 47.6*  MCV 101.1*  --  101.6* 100.8*  PLT 190  --  172 172   Cardiac Enzymes: No results for input(s): CKTOTAL, CKMB, CKMBINDEX, TROPONINI in the last 168 hours. BNP: Invalid input(s): POCBNP CBG: Recent Labs  Lab 08/05/19 1412 08/06/19 1151  GLUCAP 111* 93   D-Dimer No results for input(s): DDIMER in the last 72 hours. Hgb A1c No results for input(s): HGBA1C in the last 72 hours. Lipid Profile No results for input(s): CHOL, HDL, LDLCALC, TRIG, CHOLHDL, LDLDIRECT in the last 72 hours. Thyroid function studies Recent Labs     08/05/19 1944  TSH 1.945   Anemia work up Recent Labs    08/05/19 [place] 996*   Microbiology Recent Results (from the past 240 hour(s))  Urine culture     Status: Abnormal (Preliminary result)   Collection Time: 08/05/19  3:33 PM   Specimen: Urine, Random  Result Value Ref Range Status   Specimen Description URINE, RANDOM  Final   Special Requests NONE  Final   Culture (A)  Final    >=100,000 COLONIES/mL ESCHERICHIA COLI CULTURE REINCUBATED FOR BETTER GROWTH Performed at [facility], [address], [place],  [address]    Report Status PENDING  Incomplete   Organism ID, Bacteria ESCHERICHIA COLI (A)  Final      Susceptibility   Escherichia coli - MIC*    AMPICILLIN <=2 SENSITIVE Sensitive     CEFAZOLIN <=4 SENSITIVE Sensitive     CEFTRIAXONE <=0.25 SENSITIVE Sensitive     CIPROFLOXACIN <=0.25 SENSITIVE Sensitive     GENTAMICIN <=1 SENSITIVE Sensitive     IMIPENEM <=0.25 SENSITIVE Sensitive     NITROFURANTOIN <=16 SENSITIVE Sensitive     TRIMETH/SULFA <=20 SENSITIVE Sensitive     AMPICILLIN/SULBACTAM <=2 SENSITIVE Sensitive     PIP/TAZO <=4 SENSITIVE Sensitive     * >=100,000 COLONIES/mL ESCHERICHIA COLI  SARS CORONAVIRUS 2 (TAT 6-24 HRS) Nasopharyngeal Nasopharyngeal Swab     Status: None   Collection Time: 08/05/19  6:39 PM   Specimen: Nasopharyngeal Swab  Result Value Ref Range Status   SARS Coronavirus 2 NEGATIVE NEGATIVE Final    Comment: (NOTE) SARS-CoV-2 target nucleic acids are NOT DETECTED. The SARS-CoV-2 RNA is generally detectable in upper and lower respiratory specimens during the acute phase of infection. Negative results do not preclude SARS-CoV-2 infection, do not rule out co-infections with other pathogens, and should not be used as the sole basis for treatment or other patient management decisions. Negative results must be combined with clinical observations, patient history, and epidemiological information. The  expected result is Negative. Fact Sheet for Patients: SugarRoll.be Fact Sheet for Healthcare Providers: https://www.woods-mathews.com/ This test is not yet approved or cleared by the [place] FDA and  has been authorized for detection and/or diagnosis of SARS-CoV-2 by FDA under an Emergency Use Authorization (EUA). This EUA will remain  in effect (meaning this test can be used) for the duration of the COVID-19 declaration under Section 56 4(b)(1) of the Act,  21 U.S.C. section 360bbb-3(b)(1), unless the authorization is terminated or revoked sooner. Performed at [facility], [place] [address] [place], [place] [address]   Culture, blood (routine x 2)     Status: None (Preliminary result)   Collection Time: 08/05/19  8:19 PM   Specimen: BLOOD  Result Value Ref Range Status   Specimen Description BLOOD LEFT ANTECUBITAL  Final   Special Requests   Final    BOTTLES DRAWN AEROBIC AND ANAEROBIC Blood Culture adequate volume   Culture   Final    NO GROWTH 2 DAYS Performed at [facility], [place] [address] [place], [place] [address]    Report Status PENDING  Incomplete  Culture, blood (single) w Reflex to ID Panel     Status: None (Preliminary result)   Collection Time: 08/06/19  2:58 PM   Specimen: BLOOD  Result Value Ref Range Status   Specimen Description BLOOD LEFT ANTECUBITAL  Final   Special Requests   Final    BOTTLES DRAWN AEROBIC AND ANAEROBIC Blood Culture adequate volume   Culture   Final    NO GROWTH < 24 HOURS Performed at [facility], [place] [address] [place], [place] [address]    Report Status PENDING  Incomplete     Discharge Instructions:   Discharge Instructions     Call MD for:  persistant dizziness or light-headedness   Complete by: As directed    Call MD for:  temperature >100.4   Complete by: As directed    Diet - low sodium heart healthy   Complete by: As directed    Discharge instructions    Complete by: As directed    Take medications as prescribed Follow up with primary orthopedic MD for evaluation of chronic right knee pain Follow up with PCP 2-3 weeks for evaluation of UTI   Increase activity slowly   Complete by: As directed       Allergies as of 08/07/2019       Reactions   Metoclopramide Hcl Other (See Comments)    Tremors   Penicillins Itching   Has patient had a PCN reaction causing immediate rash, facial/tongue/throat swelling, SOB or lightheadedness with hypotension: Yes Has patient had a PCN reaction causing severe rash involving mucus membranes or skin necrosis: Yes Has patient had a PCN reaction that required hospitalization:No Has patient had a PCN reaction occurring within the last 10 years: No If all of the above answers are "NO", then may proceed with Cephalosporin use.   Aspirin Other (See Comments)   "gave me stomach aches; made me bleed" (03/13/12)   Zetia [ezetimibe] Other (See Comments)   Stomach and back pain        Medication List     TAKE these medications    ALPRAZolam 0.25 MG tablet Commonly known as: XANAX Take 0.5 tablets (0.125 mg total) by mouth 2 (two) times daily as needed for anxiety. What changed: when to take this   apixaban 5 MG Tabs tablet Commonly known as: ELIQUIS Take 1 tablet (5 mg total) by mouth 2 (two) times daily.   bisacodyl 10 MG suppository Commonly known as: DULCOLAX Place 10 mg rectally daily as needed for mild constipation.   cephALEXin 500 MG capsule Commonly known as: KEFLEX Take 1 capsule (500 mg total) by mouth 2 (two) times daily for 10 days.   diltiazem 180 MG 24 hr capsule Commonly known as: DILACOR XR Take 180 mg by mouth 2 (two) times daily.  furosemide 40 MG tablet Commonly known as: Lasix Take 1 tablet (40 mg total) by mouth daily.   iron polysaccharides 150 MG capsule Commonly known as: NIFEREX Take 1 capsule (150 mg total) by mouth daily.   metoprolol succinate 50 MG 24 hr  tablet Commonly known as: TOPROL-XL Take 50 mg by mouth in the morning and at bedtime.   multivitamin with minerals Tabs tablet Take 1 tablet by mouth daily. Centrum Silver   pantoprazole 40 MG tablet Commonly known as: PROTONIX TAKE 1 TABLET EVERY MORNING What changed:  how much to take how to take this when to take this additional instructions   senna 8.6 MG Tabs tablet Commonly known as: SENOKOT Take 1 tablet by mouth 2 (two) times daily.   Systane Overnight Therapy 0.3 % Gel ophthalmic ointment Generic drug: hypromellose Place 1 application into both eyes 4 (four) times daily as needed for dry eyes.   Vitamin D3 50 MCG (2000 UT) capsule Take 2,000 Units by mouth daily.           Time coordinating discharge: 45 minute  Signed:  Radene Gunning NP  [facility] 08/07/2019, 12:54 PM

## 2019-08-08 LAB — URINE CULTURE: Culture: >=100000 — AB

## 2019-08-10 ENCOUNTER — Other Ambulatory Visit: Payer: Self-pay

## 2019-08-10 ENCOUNTER — Emergency Department (HOSPITAL_COMMUNITY)
Admission: EM | Admit: 2019-08-10 | Discharge: 2019-08-10 | Disposition: A | Discharge status: Home / Self Care | Payer: Medicare Other | Attending: Emergency Medicine | Admitting: Emergency Medicine

## 2019-08-10 ENCOUNTER — Emergency Department (HOSPITAL_COMMUNITY): Payer: Medicare Other

## 2019-08-10 ENCOUNTER — Encounter (HOSPITAL_COMMUNITY): Payer: Self-pay | Admitting: Emergency Medicine

## 2019-08-10 DIAGNOSIS — Z951 Presence of aortocoronary bypass graft: Secondary | ICD-10-CM | POA: Insufficient documentation

## 2019-08-10 DIAGNOSIS — R52 Pain, unspecified: Secondary | ICD-10-CM | POA: Diagnosis not present

## 2019-08-10 DIAGNOSIS — W19XXXA Unspecified fall, initial encounter: Secondary | ICD-10-CM

## 2019-08-10 DIAGNOSIS — W1839XA Other fall on same level, initial encounter: Secondary | ICD-10-CM | POA: Diagnosis not present

## 2019-08-10 DIAGNOSIS — Z87891 Personal history of nicotine dependence: Secondary | ICD-10-CM | POA: Insufficient documentation

## 2019-08-10 DIAGNOSIS — S93401A Sprain of unspecified ligament of right ankle, initial encounter: Secondary | ICD-10-CM | POA: Diagnosis not present

## 2019-08-10 DIAGNOSIS — Y999 Unspecified external cause status: Secondary | ICD-10-CM | POA: Diagnosis not present

## 2019-08-10 DIAGNOSIS — I2510 Atherosclerotic heart disease of native coronary artery without angina pectoris: Secondary | ICD-10-CM | POA: Diagnosis not present

## 2019-08-10 DIAGNOSIS — Y929 Unspecified place or not applicable: Secondary | ICD-10-CM | POA: Diagnosis not present

## 2019-08-10 DIAGNOSIS — I480 Paroxysmal atrial fibrillation: Secondary | ICD-10-CM | POA: Insufficient documentation

## 2019-08-10 DIAGNOSIS — Z79899 Other long term (current) drug therapy: Secondary | ICD-10-CM | POA: Diagnosis not present

## 2019-08-10 DIAGNOSIS — Y9301 Activity, walking, marching and hiking: Secondary | ICD-10-CM | POA: Diagnosis not present

## 2019-08-10 DIAGNOSIS — M25561 Pain in right knee: Secondary | ICD-10-CM | POA: Diagnosis not present

## 2019-08-10 DIAGNOSIS — S99921A Unspecified injury of right foot, initial encounter: Secondary | ICD-10-CM | POA: Diagnosis not present

## 2019-08-10 DIAGNOSIS — S8991XA Unspecified injury of right lower leg, initial encounter: Secondary | ICD-10-CM | POA: Diagnosis not present

## 2019-08-10 DIAGNOSIS — R609 Edema, unspecified: Secondary | ICD-10-CM | POA: Diagnosis not present

## 2019-08-10 DIAGNOSIS — R29898 Other symptoms and signs involving the musculoskeletal system: Secondary | ICD-10-CM | POA: Diagnosis not present

## 2019-08-10 DIAGNOSIS — S99911A Unspecified injury of right ankle, initial encounter: Secondary | ICD-10-CM | POA: Diagnosis not present

## 2019-08-10 DIAGNOSIS — I10 Essential (primary) hypertension: Secondary | ICD-10-CM | POA: Diagnosis not present

## 2019-08-10 LAB — CULTURE, BLOOD (ROUTINE X 2)
Culture: NO GROWTH
Special Requests: ADEQUATE

## 2019-08-10 MED ORDER — HYDROCODONE-ACETAMINOPHEN 5-325 MG PO TABS
1.0000 | ORAL_TABLET | Freq: Once | ORAL | Status: AC
  Administered 2019-08-10: 1 via ORAL
  Filled 2019-08-10: qty 1

## 2019-08-10 NOTE — ED Notes (Signed)
Pt asking repeatedly when she can leave and requesting to speak w/her son. Attempted to call son per pt request - no answer. Advised pt waiting for PTAR. Pt noted to be pleasantly confused - states "I can't stay here much longer". Pt sitting on stretcher - tv turned on for distraction.

## 2019-08-10 NOTE — ED Provider Notes (Signed)
[facility] EMERGENCY DEPARTMENT Provider Note   CSN: [id number] Arrival date & time: 08/10/19  1112     History Chief Complaint  Patient presents with  . Knee Pain  . Fall  . Ankle Pain    Jacqueline [name] is a 86 y.o. female.  Patient is an 86 year old female with past medical history of CKD, coronary artery disease, osteoarthritis, atrial fibrillation presenting to the emergency department for a fall.  Patient reports a mechanical fall last night while walking in the dark in the bathroom with her walker.  Patient reports that she did not hit her head or pass out but she landed on her right knee and ankle.  Reports she is not able to bear weight at that time so she called paramedics.  Reports that they came to her house last night and picked her up and put her in bed.  Her son then came and spent the night with her.  This morning, when she was unable to bear weight they decided to be evaluated in the emergency department.        Past Medical History:  Diagnosis Date  . Allergy, unspecified not elsewhere classified   . Anemia, unspecified   . Anxiety state, unspecified   . Atrial fibrillation with rapid ventricular response ([place]) 03/13/12  . Blood in stool   . Complication of anesthesia    "hard to wake me up after colonoscopy" (03/13/12)  . Degeneration of lumbar or lumbosacral intervertebral disc   . GERD (gastroesophageal reflux disease)   . H/O hiatal hernia   . Hemorrhage of gastrointestinal tract, unspecified   . History of IBS   . Osteoarthrosis, unspecified whether generalized or localized, lower leg   . Other diseases of lung, not elsewhere classified   . Other dyspnea and respiratory abnormality   . Personal history of other diseases of digestive system   . Personal history of peptic ulcer disease   . Unspecified adverse effect of other drug, medicinal and biological substance(995.29)   . Unspecified cerebral artery occlusion with cerebral  infarction 10/2008   "mini stroke" denies residual (03/13/12)  . Unspecified essential hypertension   . Unspecified paranoid state     Patient Active Problem List   Diagnosis Date Noted  . Generalized weakness 08/05/2019  . Macrocytosis 08/05/2019  . Acute on chronic kidney failure ([place]) 08/05/2019  . Acute respiratory failure with hypoxia ([place]) 12/24/2018  . Acute on chronic diastolic CHF (congestive heart failure) ([place]) 12/24/2018  . Dyspnea 12/24/2018  . Chronic knee pain 12/24/2018  . History of multiple cerebrovascular accidents (CVAs) 12/24/2018  . GERD (gastroesophageal reflux disease) 11/04/2017  . Altered mental status 11/03/2017  . CVA (cerebral vascular accident) ([place]) 10/25/2017  . Benign essential HTN   . History of TIA (transient ischemic attack)   . TIA (transient ischemic attack) 10/24/2017  . Cerebral infarction due to embolism of right middle cerebral artery ([place])   . HLD (hyperlipidemia)   . Acute ischemic right MCA stroke ([place]) 06/18/2014  . Facial droop   . Displaced fracture of shaft of left humerus 04/03/2014  . Right arm fracture 04/01/2014  . Displaced spiral fracture of shaft of humerus   . Peripheral arterial disease ([place]) 10/03/2013  . Chronic diastolic CHF (congestive heart failure) ([place]) 08/08/2013  . Chronic venous insufficiency 04/02/2013  . UTI (urinary tract infection) 01/13/2013  . Hypokalemia 01/13/2013  . PAF (paroxysmal atrial fibrillation) ([place]) 10/03/2012  . CAD (coronary artery disease) of artery bypass graft  04/05/2012  . Atrial fibrillation with RVR ([place]) 03/13/2012  . Need for prophylactic vaccination and inoculation against influenza 02/14/2012  . ABDOMINAL WALL HERNIA 01/21/2010  . PARESTHESIA 05/04/2009  . SKIN LESION 01/28/2009  . Nocturia 01/28/2009  . DYSPNEA 01/01/2009  . CVA (cerebral infarction) 11/17/2008  . ALLERGY 10/27/2008  . PAROXYSMAL NOCTURNAL DYSPNEA 10/03/2008  . Osteoarthrosis, unspecified whether generalized  or localized, involving lower leg 09/03/2008  . RESTRICTIVE LUNG DISEASE 05/26/2008  . Symptomatic anemia 01/08/2008  . UNS ADVRS EFF OTH RX MEDICINAL&BIOLOGICAL SBSTNC 11/09/2007  . HEMATOCHEZIA 07/18/2007  . PARANOIA 06/01/2007  . Anxiety 01/23/2007  . Hypertensive heart disease with CHF ([place]) 01/23/2007  . GI BLEEDING 01/23/2007  . Osteoarthritis 01/23/2007  . DEGENERATIVE DISC DISEASE, LUMBAR SPINE 01/23/2007  . History of peptic ulcer disease 01/23/2007  . Personal history of other diseases of digestive system 01/23/2007    Past Surgical History:  Procedure Laterality Date  . DILATION AND CURETTAGE OF UTERUS  1950's?  . ORIF HUMERUS FRACTURE Right 04/03/2014   Procedure: OPEN REDUCTION INTERNAL FIXATION (ORIF) RIGHT HUMERUS DISTAL FRACTURE;  Surgeon: Roseanne Kaufman, MD;  Location: [facility];  Service: Orthopedics;  Laterality: Right;  . PILONIDAL CYST / SINUS EXCISION  1957  . SHOULDER ARTHROSCOPY  ~ 2000   right     OB History   No obstetric history on file.     Family History  Problem Relation Age of Onset  . Diabetes Mother   . CVA Mother   . CVA Father   . CVA Maternal Grandmother     Social History   Tobacco Use  . Smoking status: Former Smoker    Packs/day: 1.00    Years: 40.00    Pack years: 40.00    Types: Cigarettes    Quit date: 05/02/2002    Years since quitting: 17.2  . Smokeless tobacco: Never Used  Substance Use Topics  . Alcohol use: No  . Drug use: No    Home Medications Prior to Admission medications   Medication Sig Start Date End Date Taking? Authorizing Provider  ALPRAZolam (XANAX) 0.25 MG tablet Take 0.5 tablets (0.125 mg total) by mouth 2 (two) times daily as needed for anxiety. Patient taking differently: Take 0.125 mg by mouth every 3 (three) hours as needed for anxiety.  10/28/17   Domenic Polite, MD  apixaban (ELIQUIS) 5 MG TABS tablet Take 1 tablet (5 mg total) by mouth 2 (two) times daily. 06/20/14   Regalado, Belkys A, MD    bisacodyl (DULCOLAX) 10 MG suppository Place 10 mg rectally daily as needed for mild constipation.     [provider]  cephALEXin (KEFLEX) 500 MG capsule Take 1 capsule (500 mg total) by mouth 2 (two) times daily for 10 days. 08/07/19 08/17/19  Radene Gunning, NP  Cholecalciferol (VITAMIN D3) 50 MCG (2000 UT) capsule Take 2,000 Units by mouth daily.    [provider]  diltiazem (DILACOR XR) 180 MG 24 hr capsule Take 180 mg by mouth 2 (two) times daily.     [provider]  furosemide (LASIX) 40 MG tablet Take 1 tablet (40 mg total) by mouth daily. 12/27/18 08/06/19  Rai, Vernelle Emerald, MD  hypromellose (SYSTANE OVERNIGHT THERAPY) 0.3 % GEL ophthalmic ointment Place 1 application into both eyes 4 (four) times daily as needed for dry eyes.    [provider]  iron polysaccharides (NIFEREX) 150 MG capsule Take 1 capsule (150 mg total) by mouth daily. 12/28/18   Rai,  Ripudeep K, MD  metoprolol succinate (TOPROL-XL) 50 MG 24 hr tablet Take 50 mg by mouth in the morning and at bedtime.  06/28/19   [provider]  Multiple Vitamin (MULTIVITAMIN WITH MINERALS) TABS tablet Take 1 tablet by mouth daily. Centrum Silver    [provider]  pantoprazole (PROTONIX) 40 MG tablet TAKE 1 TABLET EVERY MORNING Patient taking differently: Take 40 mg by mouth daily.     Hendricks Limes, MD  senna (SENOKOT) 8.6 MG TABS tablet Take 1 tablet by mouth 2 (two) times daily.    [provider]    Allergies    Metoclopramide hcl, Penicillins, Aspirin, and Zetia [ezetimibe]  Review of Systems   Review of Systems  Constitutional: Negative for chills and fever.  HENT: Negative for nosebleeds.   Eyes: Negative for visual disturbance.  Respiratory: Negative for shortness of breath.   Cardiovascular: Negative for chest pain.  Gastrointestinal: Negative for abdominal pain, nausea and vomiting.  Genitourinary: Negative for dysuria.  Musculoskeletal: Positive for  arthralgias and gait problem. Negative for back pain, joint swelling, myalgias, neck pain and neck stiffness.  Skin: Negative for wound.  Neurological: Negative for dizziness, syncope and light-headedness.  Hematological: Does not bruise/bleed easily.  Psychiatric/Behavioral: Negative for confusion.    Physical Exam Updated Vital Signs BP 123/66 (BP Location: Left Arm)   Pulse 61   Temp 98.1 F (36.7 C) (Oral)   Resp 16   SpO2 96%   Physical Exam Vitals and nursing note reviewed.  Constitutional:      General: She is not in acute distress.    Appearance: Normal appearance. She is not ill-appearing, toxic-appearing or diaphoretic.  HENT:     Head: Normocephalic and atraumatic.  Eyes:     Conjunctiva/sclera: Conjunctivae normal.  Cardiovascular:     Rate and Rhythm: Normal rate.  Pulmonary:     Effort: Pulmonary effort is normal.  Musculoskeletal:     Cervical back: Neck supple. No tenderness.     Comments: Bilateral LE arthritic appearing joints. No obvious signs of injury trauma. Normal distal pules and sensation. Achilles tendon in tact. Mild TTP on the R foot and knee diffusely. Nontender right hip  Skin:    General: Skin is dry.     Capillary Refill: Capillary refill takes less than 2 seconds.     Findings: No bruising.     Comments: No obvious signs of trauma  Neurological:     General: No focal deficit present.     Mental Status: She is alert and oriented to person, place, and time.  Psychiatric:        Mood and Affect: Mood normal.     ED Results / Procedures / Treatments   Labs (all labs ordered are listed, but only abnormal results are displayed) Labs Reviewed - No data to display  EKG None  Radiology DG Ankle 2 Views Right  Result Date: 08/10/2019 CLINICAL DATA:  Pain after fall EXAM: RIGHT ANKLE - 2 VIEW COMPARISON:  None. FINDINGS: Osteopenia limits evaluation.  No acute fractures are seen. IMPRESSION: No acute fractures are seen.  Osteopenia.  Electronically Signed   By: Dorise Bullion III M.D   On: 08/10/2019 13:39   DG Knee Complete 4 Views Right  Result Date: 08/10/2019 CLINICAL DATA:  Patient fell getting out of bathtub.  Pain. EXAM: RIGHT KNEE - COMPLETE 4+ VIEW COMPARISON:  None. FINDINGS: Tricompartmental degenerative changes most prominent the lateral compartment joint space loss. No acute fracture or  effusion identified. Vascular calcifications are noted. IMPRESSION: Degenerative changes as above.  No acute fracture or effusion. Electronically Signed   By: Dorise Bullion III M.D   On: 08/10/2019 13:37   DG Foot Complete Right  Result Date: 08/10/2019 CLINICAL DATA:  Pain after fall EXAM: RIGHT FOOT COMPLETE - 3+ VIEW COMPARISON:  None. FINDINGS: There is no evidence of fracture or dislocation. There is no evidence of arthropathy or other focal bone abnormality. Soft tissues are unremarkable. IMPRESSION: Negative. Electronically Signed   By: Dorise Bullion III M.D   On: 08/10/2019 13:40    Procedures Procedures (including critical care time)  Medications Ordered in ED Medications  HYDROcodone-acetaminophen (NORCO/VICODIN) 5-325 MG per tablet 1 tablet (1 tablet Oral Given 08/10/19 1306)    ED Course  I have reviewed the triage vital signs and the nursing notes.  Pertinent labs & imaging results that were available during my care of the patient were reviewed by me and considered in my medical decision making (see chart for details).  Clinical Course as of Aug 10 1403  Sat Aug 10, 2019  1208 86 y/o with mechanical fall last night.  Son at bedside.  X-rays reassuring.  Patient to be discharged home where her son will be staying with her.  Discussed with Dr. Zenia Resides and plan agreed upon.   [KM]    Clinical Course User Index [KM] Kristine Royal   MDM Rules/Calculators/A&P                      Based on review of vitals, medical screening exam, lab work and/or imaging, there does not appear to be an acute,  emergent etiology for the patient's symptoms. Counseled pt on good return precautions and encouraged both PCP and ED follow-up as needed.  Prior to discharge, I also discussed incidental imaging findings with patient in detail and advised appropriate, recommended follow-up in detail.  Clinical Impression: 1. Fall, initial encounter   2. Sprain of right ankle, unspecified ligament, initial encounter     Disposition: Discharge  Prior to providing a prescription for a controlled substance, I independently reviewed the patient's recent prescription history on the [facility]. The patient had no recent or regular prescriptions and was deemed appropriate for a brief, less than 3 day prescription of narcotic for acute analgesia.  This note was prepared with assistance of Systems analyst. Occasional wrong-word or sound-a-like substitutions may have occurred due to the inherent limitations of voice recognition software.  Final Clinical Impression(s) / ED Diagnoses Final diagnoses:  Fall, initial encounter  Sprain of right ankle, unspecified ligament, initial encounter    Rx / DC Orders ED Discharge Orders    None       Kristine Royal 08/10/19 1405    Lacretia Leigh, MD 08/10/19 1419

## 2019-08-10 NOTE — ED Notes (Signed)
Spoke to son that was here at the hospital with her about increasing her care level, son states she has [employer] and they help that he had reached out to them to increase her care, also spoke to her other son and told him she was going home , he states he could not take her home because she could not walk, advised him we had  Called [facility] to take her back home. And that it might them a while to get here

## 2019-08-10 NOTE — ED Triage Notes (Signed)
Pt from home uses a WALKER AND FELL LAST NIGHT ABOUT 7 PM COMING FROM bATHROOM , C/O RT KNEE pain and ankle pain some swelling , no loc ,did not hit  head good pulse to foot. Rt  hurts to bear weight

## 2019-08-10 NOTE — Discharge Instructions (Signed)
You are seen today for a fall.  Your x-rays were reassuring.  Apply ice to the areas of pain and be sure to walk slowly and always with assistance. Thank you for allowing me to care for you today. Please return to the emergency department if you have new or worsening symptoms.

## 2019-08-10 NOTE — ED Notes (Signed)
Patient transported to X-ray 

## 2019-08-10 NOTE — ED Notes (Signed)
Pt is on [employer] List per [facility].

## 2019-08-10 NOTE — ED Notes (Signed)
Son Josph Macho called for pt, she wanted to make sure  He would be at her home when she got there

## 2019-08-10 NOTE — ED Notes (Signed)
Pt's son has now left to return home and wait on [facility] w/pt. Pt aware of tx plan. Pure Wick intact to pt. Dinner ordered.

## 2019-08-11 LAB — CULTURE, BLOOD (SINGLE)
Culture: NO GROWTH
Special Requests: ADEQUATE

## 2019-08-13 DIAGNOSIS — N1831 Chronic kidney disease, stage 3a: Secondary | ICD-10-CM | POA: Diagnosis not present

## 2019-08-13 DIAGNOSIS — F419 Anxiety disorder, unspecified: Secondary | ICD-10-CM | POA: Diagnosis not present

## 2019-08-13 DIAGNOSIS — D6869 Other thrombophilia: Secondary | ICD-10-CM | POA: Diagnosis not present

## 2019-08-13 DIAGNOSIS — I129 Hypertensive chronic kidney disease with stage 1 through stage 4 chronic kidney disease, or unspecified chronic kidney disease: Secondary | ICD-10-CM | POA: Diagnosis not present

## 2019-08-13 DIAGNOSIS — I700 Atherosclerosis of aorta: Secondary | ICD-10-CM | POA: Diagnosis not present

## 2019-08-13 DIAGNOSIS — I482 Chronic atrial fibrillation, unspecified: Secondary | ICD-10-CM | POA: Diagnosis not present

## 2019-08-28 DIAGNOSIS — Z111 Encounter for screening for respiratory tuberculosis: Secondary | ICD-10-CM | POA: Diagnosis not present

## 2019-08-30 DIAGNOSIS — Z03818 Encounter for observation for suspected exposure to other biological agents ruled out: Secondary | ICD-10-CM | POA: Diagnosis not present

## 2019-08-30 DIAGNOSIS — Z20828 Contact with and (suspected) exposure to other viral communicable diseases: Secondary | ICD-10-CM | POA: Diagnosis not present

## 2019-09-26 DIAGNOSIS — R296 Repeated falls: Secondary | ICD-10-CM | POA: Diagnosis not present

## 2019-09-26 DIAGNOSIS — M6281 Muscle weakness (generalized): Secondary | ICD-10-CM | POA: Diagnosis not present

## 2019-09-26 DIAGNOSIS — R2681 Unsteadiness on feet: Secondary | ICD-10-CM | POA: Diagnosis not present

## 2019-09-26 DIAGNOSIS — R262 Difficulty in walking, not elsewhere classified: Secondary | ICD-10-CM | POA: Diagnosis not present

## 2019-10-31 DIAGNOSIS — R488 Other symbolic dysfunctions: Secondary | ICD-10-CM | POA: Diagnosis not present

## 2019-10-31 DIAGNOSIS — R262 Difficulty in walking, not elsewhere classified: Secondary | ICD-10-CM | POA: Diagnosis not present

## 2019-10-31 DIAGNOSIS — M6281 Muscle weakness (generalized): Secondary | ICD-10-CM | POA: Diagnosis not present

## 2019-10-31 DIAGNOSIS — R41841 Cognitive communication deficit: Secondary | ICD-10-CM | POA: Diagnosis not present

## 2019-10-31 DIAGNOSIS — R296 Repeated falls: Secondary | ICD-10-CM | POA: Diagnosis not present

## 2019-10-31 DIAGNOSIS — R2681 Unsteadiness on feet: Secondary | ICD-10-CM | POA: Diagnosis not present

## 2019-11-01 DIAGNOSIS — R262 Difficulty in walking, not elsewhere classified: Secondary | ICD-10-CM | POA: Diagnosis not present

## 2019-11-01 DIAGNOSIS — R2681 Unsteadiness on feet: Secondary | ICD-10-CM | POA: Diagnosis not present

## 2019-11-01 DIAGNOSIS — R296 Repeated falls: Secondary | ICD-10-CM | POA: Diagnosis not present

## 2019-11-01 DIAGNOSIS — R41841 Cognitive communication deficit: Secondary | ICD-10-CM | POA: Diagnosis not present

## 2019-11-01 DIAGNOSIS — M6281 Muscle weakness (generalized): Secondary | ICD-10-CM | POA: Diagnosis not present

## 2019-11-01 DIAGNOSIS — R488 Other symbolic dysfunctions: Secondary | ICD-10-CM | POA: Diagnosis not present

## 2019-11-04 DIAGNOSIS — R488 Other symbolic dysfunctions: Secondary | ICD-10-CM | POA: Diagnosis not present

## 2019-11-04 DIAGNOSIS — R41841 Cognitive communication deficit: Secondary | ICD-10-CM | POA: Diagnosis not present

## 2019-11-04 DIAGNOSIS — M6281 Muscle weakness (generalized): Secondary | ICD-10-CM | POA: Diagnosis not present

## 2019-11-04 DIAGNOSIS — R296 Repeated falls: Secondary | ICD-10-CM | POA: Diagnosis not present

## 2019-11-04 DIAGNOSIS — R262 Difficulty in walking, not elsewhere classified: Secondary | ICD-10-CM | POA: Diagnosis not present

## 2019-11-04 DIAGNOSIS — R2681 Unsteadiness on feet: Secondary | ICD-10-CM | POA: Diagnosis not present

## 2019-11-05 DIAGNOSIS — M6281 Muscle weakness (generalized): Secondary | ICD-10-CM | POA: Diagnosis not present

## 2019-11-05 DIAGNOSIS — R488 Other symbolic dysfunctions: Secondary | ICD-10-CM | POA: Diagnosis not present

## 2019-11-05 DIAGNOSIS — R41841 Cognitive communication deficit: Secondary | ICD-10-CM | POA: Diagnosis not present

## 2019-11-05 DIAGNOSIS — R296 Repeated falls: Secondary | ICD-10-CM | POA: Diagnosis not present

## 2019-11-05 DIAGNOSIS — R262 Difficulty in walking, not elsewhere classified: Secondary | ICD-10-CM | POA: Diagnosis not present

## 2019-11-05 DIAGNOSIS — R2681 Unsteadiness on feet: Secondary | ICD-10-CM | POA: Diagnosis not present

## 2019-11-06 DIAGNOSIS — R262 Difficulty in walking, not elsewhere classified: Secondary | ICD-10-CM | POA: Diagnosis not present

## 2019-11-06 DIAGNOSIS — M6281 Muscle weakness (generalized): Secondary | ICD-10-CM | POA: Diagnosis not present

## 2019-11-06 DIAGNOSIS — R2681 Unsteadiness on feet: Secondary | ICD-10-CM | POA: Diagnosis not present

## 2019-11-06 DIAGNOSIS — R41841 Cognitive communication deficit: Secondary | ICD-10-CM | POA: Diagnosis not present

## 2019-11-06 DIAGNOSIS — R488 Other symbolic dysfunctions: Secondary | ICD-10-CM | POA: Diagnosis not present

## 2019-11-06 DIAGNOSIS — R296 Repeated falls: Secondary | ICD-10-CM | POA: Diagnosis not present

## 2019-11-07 DIAGNOSIS — R296 Repeated falls: Secondary | ICD-10-CM | POA: Diagnosis not present

## 2019-11-07 DIAGNOSIS — R262 Difficulty in walking, not elsewhere classified: Secondary | ICD-10-CM | POA: Diagnosis not present

## 2019-11-07 DIAGNOSIS — R2681 Unsteadiness on feet: Secondary | ICD-10-CM | POA: Diagnosis not present

## 2019-11-07 DIAGNOSIS — M6281 Muscle weakness (generalized): Secondary | ICD-10-CM | POA: Diagnosis not present

## 2019-11-07 DIAGNOSIS — R41841 Cognitive communication deficit: Secondary | ICD-10-CM | POA: Diagnosis not present

## 2019-11-07 DIAGNOSIS — R488 Other symbolic dysfunctions: Secondary | ICD-10-CM | POA: Diagnosis not present

## 2019-11-11 DIAGNOSIS — R2681 Unsteadiness on feet: Secondary | ICD-10-CM | POA: Diagnosis not present

## 2019-11-11 DIAGNOSIS — R488 Other symbolic dysfunctions: Secondary | ICD-10-CM | POA: Diagnosis not present

## 2019-11-11 DIAGNOSIS — R296 Repeated falls: Secondary | ICD-10-CM | POA: Diagnosis not present

## 2019-11-11 DIAGNOSIS — R262 Difficulty in walking, not elsewhere classified: Secondary | ICD-10-CM | POA: Diagnosis not present

## 2019-11-11 DIAGNOSIS — M6281 Muscle weakness (generalized): Secondary | ICD-10-CM | POA: Diagnosis not present

## 2019-11-11 DIAGNOSIS — R41841 Cognitive communication deficit: Secondary | ICD-10-CM | POA: Diagnosis not present

## 2019-11-12 DIAGNOSIS — R262 Difficulty in walking, not elsewhere classified: Secondary | ICD-10-CM | POA: Diagnosis not present

## 2019-11-12 DIAGNOSIS — M6281 Muscle weakness (generalized): Secondary | ICD-10-CM | POA: Diagnosis not present

## 2019-11-12 DIAGNOSIS — R41841 Cognitive communication deficit: Secondary | ICD-10-CM | POA: Diagnosis not present

## 2019-11-12 DIAGNOSIS — R488 Other symbolic dysfunctions: Secondary | ICD-10-CM | POA: Diagnosis not present

## 2019-11-12 DIAGNOSIS — R296 Repeated falls: Secondary | ICD-10-CM | POA: Diagnosis not present

## 2019-11-12 DIAGNOSIS — R2681 Unsteadiness on feet: Secondary | ICD-10-CM | POA: Diagnosis not present

## 2019-11-13 DIAGNOSIS — R2681 Unsteadiness on feet: Secondary | ICD-10-CM | POA: Diagnosis not present

## 2019-11-13 DIAGNOSIS — R296 Repeated falls: Secondary | ICD-10-CM | POA: Diagnosis not present

## 2019-11-13 DIAGNOSIS — M6281 Muscle weakness (generalized): Secondary | ICD-10-CM | POA: Diagnosis not present

## 2019-11-13 DIAGNOSIS — R488 Other symbolic dysfunctions: Secondary | ICD-10-CM | POA: Diagnosis not present

## 2019-11-13 DIAGNOSIS — R41841 Cognitive communication deficit: Secondary | ICD-10-CM | POA: Diagnosis not present

## 2019-11-13 DIAGNOSIS — R262 Difficulty in walking, not elsewhere classified: Secondary | ICD-10-CM | POA: Diagnosis not present

## 2019-11-14 DIAGNOSIS — R41841 Cognitive communication deficit: Secondary | ICD-10-CM | POA: Diagnosis not present

## 2019-11-14 DIAGNOSIS — R262 Difficulty in walking, not elsewhere classified: Secondary | ICD-10-CM | POA: Diagnosis not present

## 2019-11-14 DIAGNOSIS — R488 Other symbolic dysfunctions: Secondary | ICD-10-CM | POA: Diagnosis not present

## 2019-11-14 DIAGNOSIS — R296 Repeated falls: Secondary | ICD-10-CM | POA: Diagnosis not present

## 2019-11-14 DIAGNOSIS — R2681 Unsteadiness on feet: Secondary | ICD-10-CM | POA: Diagnosis not present

## 2019-11-14 DIAGNOSIS — M6281 Muscle weakness (generalized): Secondary | ICD-10-CM | POA: Diagnosis not present

## 2019-11-15 DIAGNOSIS — R296 Repeated falls: Secondary | ICD-10-CM | POA: Diagnosis not present

## 2019-11-15 DIAGNOSIS — R262 Difficulty in walking, not elsewhere classified: Secondary | ICD-10-CM | POA: Diagnosis not present

## 2019-11-15 DIAGNOSIS — M6281 Muscle weakness (generalized): Secondary | ICD-10-CM | POA: Diagnosis not present

## 2019-11-15 DIAGNOSIS — R488 Other symbolic dysfunctions: Secondary | ICD-10-CM | POA: Diagnosis not present

## 2019-11-15 DIAGNOSIS — R41841 Cognitive communication deficit: Secondary | ICD-10-CM | POA: Diagnosis not present

## 2019-11-15 DIAGNOSIS — R2681 Unsteadiness on feet: Secondary | ICD-10-CM | POA: Diagnosis not present

## 2019-11-16 DIAGNOSIS — R262 Difficulty in walking, not elsewhere classified: Secondary | ICD-10-CM | POA: Diagnosis not present

## 2019-11-16 DIAGNOSIS — R488 Other symbolic dysfunctions: Secondary | ICD-10-CM | POA: Diagnosis not present

## 2019-11-16 DIAGNOSIS — R296 Repeated falls: Secondary | ICD-10-CM | POA: Diagnosis not present

## 2019-11-16 DIAGNOSIS — R41841 Cognitive communication deficit: Secondary | ICD-10-CM | POA: Diagnosis not present

## 2019-11-16 DIAGNOSIS — R2681 Unsteadiness on feet: Secondary | ICD-10-CM | POA: Diagnosis not present

## 2019-11-16 DIAGNOSIS — M6281 Muscle weakness (generalized): Secondary | ICD-10-CM | POA: Diagnosis not present

## 2019-11-18 DIAGNOSIS — R488 Other symbolic dysfunctions: Secondary | ICD-10-CM | POA: Diagnosis not present

## 2019-11-18 DIAGNOSIS — R296 Repeated falls: Secondary | ICD-10-CM | POA: Diagnosis not present

## 2019-11-18 DIAGNOSIS — R41841 Cognitive communication deficit: Secondary | ICD-10-CM | POA: Diagnosis not present

## 2019-11-18 DIAGNOSIS — M6281 Muscle weakness (generalized): Secondary | ICD-10-CM | POA: Diagnosis not present

## 2019-11-18 DIAGNOSIS — R2681 Unsteadiness on feet: Secondary | ICD-10-CM | POA: Diagnosis not present

## 2019-11-18 DIAGNOSIS — R262 Difficulty in walking, not elsewhere classified: Secondary | ICD-10-CM | POA: Diagnosis not present

## 2019-11-19 DIAGNOSIS — R488 Other symbolic dysfunctions: Secondary | ICD-10-CM | POA: Diagnosis not present

## 2019-11-19 DIAGNOSIS — R262 Difficulty in walking, not elsewhere classified: Secondary | ICD-10-CM | POA: Diagnosis not present

## 2019-11-19 DIAGNOSIS — R296 Repeated falls: Secondary | ICD-10-CM | POA: Diagnosis not present

## 2019-11-19 DIAGNOSIS — R2681 Unsteadiness on feet: Secondary | ICD-10-CM | POA: Diagnosis not present

## 2019-11-19 DIAGNOSIS — M6281 Muscle weakness (generalized): Secondary | ICD-10-CM | POA: Diagnosis not present

## 2019-11-19 DIAGNOSIS — R41841 Cognitive communication deficit: Secondary | ICD-10-CM | POA: Diagnosis not present

## 2019-11-21 DIAGNOSIS — R2681 Unsteadiness on feet: Secondary | ICD-10-CM | POA: Diagnosis not present

## 2019-11-21 DIAGNOSIS — R262 Difficulty in walking, not elsewhere classified: Secondary | ICD-10-CM | POA: Diagnosis not present

## 2019-11-21 DIAGNOSIS — R41841 Cognitive communication deficit: Secondary | ICD-10-CM | POA: Diagnosis not present

## 2019-11-21 DIAGNOSIS — R296 Repeated falls: Secondary | ICD-10-CM | POA: Diagnosis not present

## 2019-11-21 DIAGNOSIS — M6281 Muscle weakness (generalized): Secondary | ICD-10-CM | POA: Diagnosis not present

## 2019-11-21 DIAGNOSIS — R488 Other symbolic dysfunctions: Secondary | ICD-10-CM | POA: Diagnosis not present

## 2019-11-22 DIAGNOSIS — R41841 Cognitive communication deficit: Secondary | ICD-10-CM | POA: Diagnosis not present

## 2019-11-22 DIAGNOSIS — R296 Repeated falls: Secondary | ICD-10-CM | POA: Diagnosis not present

## 2019-11-22 DIAGNOSIS — M6281 Muscle weakness (generalized): Secondary | ICD-10-CM | POA: Diagnosis not present

## 2019-11-22 DIAGNOSIS — R262 Difficulty in walking, not elsewhere classified: Secondary | ICD-10-CM | POA: Diagnosis not present

## 2019-11-22 DIAGNOSIS — R488 Other symbolic dysfunctions: Secondary | ICD-10-CM | POA: Diagnosis not present

## 2019-11-22 DIAGNOSIS — R2681 Unsteadiness on feet: Secondary | ICD-10-CM | POA: Diagnosis not present

## 2019-11-25 DIAGNOSIS — R2681 Unsteadiness on feet: Secondary | ICD-10-CM | POA: Diagnosis not present

## 2019-11-25 DIAGNOSIS — R41841 Cognitive communication deficit: Secondary | ICD-10-CM | POA: Diagnosis not present

## 2019-11-25 DIAGNOSIS — R488 Other symbolic dysfunctions: Secondary | ICD-10-CM | POA: Diagnosis not present

## 2019-11-25 DIAGNOSIS — R262 Difficulty in walking, not elsewhere classified: Secondary | ICD-10-CM | POA: Diagnosis not present

## 2019-11-25 DIAGNOSIS — R296 Repeated falls: Secondary | ICD-10-CM | POA: Diagnosis not present

## 2019-11-25 DIAGNOSIS — M6281 Muscle weakness (generalized): Secondary | ICD-10-CM | POA: Diagnosis not present

## 2019-11-26 DIAGNOSIS — R296 Repeated falls: Secondary | ICD-10-CM | POA: Diagnosis not present

## 2019-11-26 DIAGNOSIS — R41841 Cognitive communication deficit: Secondary | ICD-10-CM | POA: Diagnosis not present

## 2019-11-26 DIAGNOSIS — R262 Difficulty in walking, not elsewhere classified: Secondary | ICD-10-CM | POA: Diagnosis not present

## 2019-11-26 DIAGNOSIS — M6281 Muscle weakness (generalized): Secondary | ICD-10-CM | POA: Diagnosis not present

## 2019-11-26 DIAGNOSIS — R488 Other symbolic dysfunctions: Secondary | ICD-10-CM | POA: Diagnosis not present

## 2019-11-26 DIAGNOSIS — R2681 Unsteadiness on feet: Secondary | ICD-10-CM | POA: Diagnosis not present

## 2019-11-27 DIAGNOSIS — R2681 Unsteadiness on feet: Secondary | ICD-10-CM | POA: Diagnosis not present

## 2019-11-27 DIAGNOSIS — M6281 Muscle weakness (generalized): Secondary | ICD-10-CM | POA: Diagnosis not present

## 2019-11-27 DIAGNOSIS — R296 Repeated falls: Secondary | ICD-10-CM | POA: Diagnosis not present

## 2019-11-27 DIAGNOSIS — R41841 Cognitive communication deficit: Secondary | ICD-10-CM | POA: Diagnosis not present

## 2019-11-27 DIAGNOSIS — R262 Difficulty in walking, not elsewhere classified: Secondary | ICD-10-CM | POA: Diagnosis not present

## 2019-11-27 DIAGNOSIS — R488 Other symbolic dysfunctions: Secondary | ICD-10-CM | POA: Diagnosis not present

## 2019-11-28 DIAGNOSIS — M6281 Muscle weakness (generalized): Secondary | ICD-10-CM | POA: Diagnosis not present

## 2019-11-28 DIAGNOSIS — R41841 Cognitive communication deficit: Secondary | ICD-10-CM | POA: Diagnosis not present

## 2019-11-28 DIAGNOSIS — R488 Other symbolic dysfunctions: Secondary | ICD-10-CM | POA: Diagnosis not present

## 2019-11-28 DIAGNOSIS — R2681 Unsteadiness on feet: Secondary | ICD-10-CM | POA: Diagnosis not present

## 2019-11-28 DIAGNOSIS — R296 Repeated falls: Secondary | ICD-10-CM | POA: Diagnosis not present

## 2019-11-28 DIAGNOSIS — R262 Difficulty in walking, not elsewhere classified: Secondary | ICD-10-CM | POA: Diagnosis not present

## 2019-11-29 DIAGNOSIS — R2681 Unsteadiness on feet: Secondary | ICD-10-CM | POA: Diagnosis not present

## 2019-11-29 DIAGNOSIS — R488 Other symbolic dysfunctions: Secondary | ICD-10-CM | POA: Diagnosis not present

## 2019-11-29 DIAGNOSIS — R41841 Cognitive communication deficit: Secondary | ICD-10-CM | POA: Diagnosis not present

## 2019-11-29 DIAGNOSIS — R262 Difficulty in walking, not elsewhere classified: Secondary | ICD-10-CM | POA: Diagnosis not present

## 2019-11-29 DIAGNOSIS — M6281 Muscle weakness (generalized): Secondary | ICD-10-CM | POA: Diagnosis not present

## 2019-11-29 DIAGNOSIS — R296 Repeated falls: Secondary | ICD-10-CM | POA: Diagnosis not present

## 2019-12-02 DIAGNOSIS — R488 Other symbolic dysfunctions: Secondary | ICD-10-CM | POA: Diagnosis not present

## 2019-12-02 DIAGNOSIS — R41841 Cognitive communication deficit: Secondary | ICD-10-CM | POA: Diagnosis not present

## 2019-12-02 DIAGNOSIS — R296 Repeated falls: Secondary | ICD-10-CM | POA: Diagnosis not present

## 2019-12-02 DIAGNOSIS — R262 Difficulty in walking, not elsewhere classified: Secondary | ICD-10-CM | POA: Diagnosis not present

## 2019-12-02 DIAGNOSIS — R2681 Unsteadiness on feet: Secondary | ICD-10-CM | POA: Diagnosis not present

## 2019-12-02 DIAGNOSIS — M6281 Muscle weakness (generalized): Secondary | ICD-10-CM | POA: Diagnosis not present

## 2019-12-03 DIAGNOSIS — R262 Difficulty in walking, not elsewhere classified: Secondary | ICD-10-CM | POA: Diagnosis not present

## 2019-12-03 DIAGNOSIS — R296 Repeated falls: Secondary | ICD-10-CM | POA: Diagnosis not present

## 2019-12-03 DIAGNOSIS — R41841 Cognitive communication deficit: Secondary | ICD-10-CM | POA: Diagnosis not present

## 2019-12-03 DIAGNOSIS — R488 Other symbolic dysfunctions: Secondary | ICD-10-CM | POA: Diagnosis not present

## 2019-12-03 DIAGNOSIS — R2681 Unsteadiness on feet: Secondary | ICD-10-CM | POA: Diagnosis not present

## 2019-12-03 DIAGNOSIS — M6281 Muscle weakness (generalized): Secondary | ICD-10-CM | POA: Diagnosis not present

## 2019-12-04 DIAGNOSIS — R488 Other symbolic dysfunctions: Secondary | ICD-10-CM | POA: Diagnosis not present

## 2019-12-04 DIAGNOSIS — M6281 Muscle weakness (generalized): Secondary | ICD-10-CM | POA: Diagnosis not present

## 2019-12-04 DIAGNOSIS — R296 Repeated falls: Secondary | ICD-10-CM | POA: Diagnosis not present

## 2019-12-04 DIAGNOSIS — R262 Difficulty in walking, not elsewhere classified: Secondary | ICD-10-CM | POA: Diagnosis not present

## 2019-12-04 DIAGNOSIS — R41841 Cognitive communication deficit: Secondary | ICD-10-CM | POA: Diagnosis not present

## 2019-12-04 DIAGNOSIS — R2681 Unsteadiness on feet: Secondary | ICD-10-CM | POA: Diagnosis not present

## 2019-12-05 DIAGNOSIS — R296 Repeated falls: Secondary | ICD-10-CM | POA: Diagnosis not present

## 2019-12-05 DIAGNOSIS — R41841 Cognitive communication deficit: Secondary | ICD-10-CM | POA: Diagnosis not present

## 2019-12-05 DIAGNOSIS — R262 Difficulty in walking, not elsewhere classified: Secondary | ICD-10-CM | POA: Diagnosis not present

## 2019-12-05 DIAGNOSIS — R488 Other symbolic dysfunctions: Secondary | ICD-10-CM | POA: Diagnosis not present

## 2019-12-05 DIAGNOSIS — R2681 Unsteadiness on feet: Secondary | ICD-10-CM | POA: Diagnosis not present

## 2019-12-05 DIAGNOSIS — M6281 Muscle weakness (generalized): Secondary | ICD-10-CM | POA: Diagnosis not present

## 2019-12-06 DIAGNOSIS — R262 Difficulty in walking, not elsewhere classified: Secondary | ICD-10-CM | POA: Diagnosis not present

## 2019-12-06 DIAGNOSIS — R488 Other symbolic dysfunctions: Secondary | ICD-10-CM | POA: Diagnosis not present

## 2019-12-06 DIAGNOSIS — R296 Repeated falls: Secondary | ICD-10-CM | POA: Diagnosis not present

## 2019-12-06 DIAGNOSIS — M6281 Muscle weakness (generalized): Secondary | ICD-10-CM | POA: Diagnosis not present

## 2019-12-06 DIAGNOSIS — R2681 Unsteadiness on feet: Secondary | ICD-10-CM | POA: Diagnosis not present

## 2019-12-06 DIAGNOSIS — R41841 Cognitive communication deficit: Secondary | ICD-10-CM | POA: Diagnosis not present

## 2019-12-09 DIAGNOSIS — R262 Difficulty in walking, not elsewhere classified: Secondary | ICD-10-CM | POA: Diagnosis not present

## 2019-12-09 DIAGNOSIS — M6281 Muscle weakness (generalized): Secondary | ICD-10-CM | POA: Diagnosis not present

## 2019-12-09 DIAGNOSIS — R488 Other symbolic dysfunctions: Secondary | ICD-10-CM | POA: Diagnosis not present

## 2019-12-09 DIAGNOSIS — R296 Repeated falls: Secondary | ICD-10-CM | POA: Diagnosis not present

## 2019-12-09 DIAGNOSIS — R41841 Cognitive communication deficit: Secondary | ICD-10-CM | POA: Diagnosis not present

## 2019-12-09 DIAGNOSIS — R2681 Unsteadiness on feet: Secondary | ICD-10-CM | POA: Diagnosis not present

## 2019-12-10 DIAGNOSIS — R41841 Cognitive communication deficit: Secondary | ICD-10-CM | POA: Diagnosis not present

## 2019-12-10 DIAGNOSIS — R262 Difficulty in walking, not elsewhere classified: Secondary | ICD-10-CM | POA: Diagnosis not present

## 2019-12-10 DIAGNOSIS — R296 Repeated falls: Secondary | ICD-10-CM | POA: Diagnosis not present

## 2019-12-10 DIAGNOSIS — M6281 Muscle weakness (generalized): Secondary | ICD-10-CM | POA: Diagnosis not present

## 2019-12-10 DIAGNOSIS — R2681 Unsteadiness on feet: Secondary | ICD-10-CM | POA: Diagnosis not present

## 2019-12-10 DIAGNOSIS — R488 Other symbolic dysfunctions: Secondary | ICD-10-CM | POA: Diagnosis not present

## 2019-12-11 DIAGNOSIS — R41841 Cognitive communication deficit: Secondary | ICD-10-CM | POA: Diagnosis not present

## 2019-12-11 DIAGNOSIS — R488 Other symbolic dysfunctions: Secondary | ICD-10-CM | POA: Diagnosis not present

## 2019-12-11 DIAGNOSIS — R296 Repeated falls: Secondary | ICD-10-CM | POA: Diagnosis not present

## 2019-12-11 DIAGNOSIS — M6281 Muscle weakness (generalized): Secondary | ICD-10-CM | POA: Diagnosis not present

## 2019-12-11 DIAGNOSIS — R262 Difficulty in walking, not elsewhere classified: Secondary | ICD-10-CM | POA: Diagnosis not present

## 2019-12-11 DIAGNOSIS — R2681 Unsteadiness on feet: Secondary | ICD-10-CM | POA: Diagnosis not present

## 2019-12-12 DIAGNOSIS — M6281 Muscle weakness (generalized): Secondary | ICD-10-CM | POA: Diagnosis not present

## 2019-12-12 DIAGNOSIS — R41841 Cognitive communication deficit: Secondary | ICD-10-CM | POA: Diagnosis not present

## 2019-12-12 DIAGNOSIS — R262 Difficulty in walking, not elsewhere classified: Secondary | ICD-10-CM | POA: Diagnosis not present

## 2019-12-12 DIAGNOSIS — R296 Repeated falls: Secondary | ICD-10-CM | POA: Diagnosis not present

## 2019-12-12 DIAGNOSIS — R488 Other symbolic dysfunctions: Secondary | ICD-10-CM | POA: Diagnosis not present

## 2019-12-12 DIAGNOSIS — R2681 Unsteadiness on feet: Secondary | ICD-10-CM | POA: Diagnosis not present

## 2019-12-13 DIAGNOSIS — R262 Difficulty in walking, not elsewhere classified: Secondary | ICD-10-CM | POA: Diagnosis not present

## 2019-12-13 DIAGNOSIS — R41841 Cognitive communication deficit: Secondary | ICD-10-CM | POA: Diagnosis not present

## 2019-12-13 DIAGNOSIS — R488 Other symbolic dysfunctions: Secondary | ICD-10-CM | POA: Diagnosis not present

## 2019-12-13 DIAGNOSIS — R2681 Unsteadiness on feet: Secondary | ICD-10-CM | POA: Diagnosis not present

## 2019-12-13 DIAGNOSIS — M6281 Muscle weakness (generalized): Secondary | ICD-10-CM | POA: Diagnosis not present

## 2019-12-13 DIAGNOSIS — R296 Repeated falls: Secondary | ICD-10-CM | POA: Diagnosis not present

## 2019-12-16 DIAGNOSIS — M6281 Muscle weakness (generalized): Secondary | ICD-10-CM | POA: Diagnosis not present

## 2019-12-16 DIAGNOSIS — R296 Repeated falls: Secondary | ICD-10-CM | POA: Diagnosis not present

## 2019-12-16 DIAGNOSIS — R2681 Unsteadiness on feet: Secondary | ICD-10-CM | POA: Diagnosis not present

## 2019-12-16 DIAGNOSIS — R41841 Cognitive communication deficit: Secondary | ICD-10-CM | POA: Diagnosis not present

## 2019-12-16 DIAGNOSIS — R262 Difficulty in walking, not elsewhere classified: Secondary | ICD-10-CM | POA: Diagnosis not present

## 2019-12-16 DIAGNOSIS — R488 Other symbolic dysfunctions: Secondary | ICD-10-CM | POA: Diagnosis not present

## 2019-12-17 DIAGNOSIS — R41841 Cognitive communication deficit: Secondary | ICD-10-CM | POA: Diagnosis not present

## 2019-12-17 DIAGNOSIS — R2681 Unsteadiness on feet: Secondary | ICD-10-CM | POA: Diagnosis not present

## 2019-12-17 DIAGNOSIS — R488 Other symbolic dysfunctions: Secondary | ICD-10-CM | POA: Diagnosis not present

## 2019-12-17 DIAGNOSIS — R296 Repeated falls: Secondary | ICD-10-CM | POA: Diagnosis not present

## 2019-12-17 DIAGNOSIS — R262 Difficulty in walking, not elsewhere classified: Secondary | ICD-10-CM | POA: Diagnosis not present

## 2019-12-17 DIAGNOSIS — M6281 Muscle weakness (generalized): Secondary | ICD-10-CM | POA: Diagnosis not present

## 2019-12-18 DIAGNOSIS — R2681 Unsteadiness on feet: Secondary | ICD-10-CM | POA: Diagnosis not present

## 2019-12-18 DIAGNOSIS — R262 Difficulty in walking, not elsewhere classified: Secondary | ICD-10-CM | POA: Diagnosis not present

## 2019-12-18 DIAGNOSIS — M6281 Muscle weakness (generalized): Secondary | ICD-10-CM | POA: Diagnosis not present

## 2019-12-18 DIAGNOSIS — R41841 Cognitive communication deficit: Secondary | ICD-10-CM | POA: Diagnosis not present

## 2019-12-18 DIAGNOSIS — R488 Other symbolic dysfunctions: Secondary | ICD-10-CM | POA: Diagnosis not present

## 2019-12-18 DIAGNOSIS — R296 Repeated falls: Secondary | ICD-10-CM | POA: Diagnosis not present

## 2019-12-19 DIAGNOSIS — M6281 Muscle weakness (generalized): Secondary | ICD-10-CM | POA: Diagnosis not present

## 2019-12-19 DIAGNOSIS — R2681 Unsteadiness on feet: Secondary | ICD-10-CM | POA: Diagnosis not present

## 2019-12-19 DIAGNOSIS — R262 Difficulty in walking, not elsewhere classified: Secondary | ICD-10-CM | POA: Diagnosis not present

## 2019-12-19 DIAGNOSIS — R41841 Cognitive communication deficit: Secondary | ICD-10-CM | POA: Diagnosis not present

## 2019-12-19 DIAGNOSIS — R296 Repeated falls: Secondary | ICD-10-CM | POA: Diagnosis not present

## 2019-12-19 DIAGNOSIS — R488 Other symbolic dysfunctions: Secondary | ICD-10-CM | POA: Diagnosis not present

## 2019-12-20 DIAGNOSIS — R296 Repeated falls: Secondary | ICD-10-CM | POA: Diagnosis not present

## 2019-12-20 DIAGNOSIS — R262 Difficulty in walking, not elsewhere classified: Secondary | ICD-10-CM | POA: Diagnosis not present

## 2019-12-20 DIAGNOSIS — R488 Other symbolic dysfunctions: Secondary | ICD-10-CM | POA: Diagnosis not present

## 2019-12-20 DIAGNOSIS — M6281 Muscle weakness (generalized): Secondary | ICD-10-CM | POA: Diagnosis not present

## 2019-12-20 DIAGNOSIS — R2681 Unsteadiness on feet: Secondary | ICD-10-CM | POA: Diagnosis not present

## 2019-12-20 DIAGNOSIS — R41841 Cognitive communication deficit: Secondary | ICD-10-CM | POA: Diagnosis not present

## 2019-12-22 DIAGNOSIS — R41841 Cognitive communication deficit: Secondary | ICD-10-CM | POA: Diagnosis not present

## 2019-12-22 DIAGNOSIS — R2681 Unsteadiness on feet: Secondary | ICD-10-CM | POA: Diagnosis not present

## 2019-12-22 DIAGNOSIS — R488 Other symbolic dysfunctions: Secondary | ICD-10-CM | POA: Diagnosis not present

## 2019-12-22 DIAGNOSIS — M6281 Muscle weakness (generalized): Secondary | ICD-10-CM | POA: Diagnosis not present

## 2019-12-22 DIAGNOSIS — R296 Repeated falls: Secondary | ICD-10-CM | POA: Diagnosis not present

## 2019-12-22 DIAGNOSIS — R262 Difficulty in walking, not elsewhere classified: Secondary | ICD-10-CM | POA: Diagnosis not present

## 2019-12-23 DIAGNOSIS — R262 Difficulty in walking, not elsewhere classified: Secondary | ICD-10-CM | POA: Diagnosis not present

## 2019-12-23 DIAGNOSIS — R41841 Cognitive communication deficit: Secondary | ICD-10-CM | POA: Diagnosis not present

## 2019-12-23 DIAGNOSIS — R296 Repeated falls: Secondary | ICD-10-CM | POA: Diagnosis not present

## 2019-12-23 DIAGNOSIS — R2681 Unsteadiness on feet: Secondary | ICD-10-CM | POA: Diagnosis not present

## 2019-12-23 DIAGNOSIS — R488 Other symbolic dysfunctions: Secondary | ICD-10-CM | POA: Diagnosis not present

## 2019-12-23 DIAGNOSIS — M6281 Muscle weakness (generalized): Secondary | ICD-10-CM | POA: Diagnosis not present

## 2019-12-24 DIAGNOSIS — R488 Other symbolic dysfunctions: Secondary | ICD-10-CM | POA: Diagnosis not present

## 2019-12-24 DIAGNOSIS — M6281 Muscle weakness (generalized): Secondary | ICD-10-CM | POA: Diagnosis not present

## 2019-12-24 DIAGNOSIS — R262 Difficulty in walking, not elsewhere classified: Secondary | ICD-10-CM | POA: Diagnosis not present

## 2019-12-24 DIAGNOSIS — R41841 Cognitive communication deficit: Secondary | ICD-10-CM | POA: Diagnosis not present

## 2019-12-24 DIAGNOSIS — R296 Repeated falls: Secondary | ICD-10-CM | POA: Diagnosis not present

## 2019-12-24 DIAGNOSIS — R2681 Unsteadiness on feet: Secondary | ICD-10-CM | POA: Diagnosis not present

## 2019-12-25 DIAGNOSIS — M6281 Muscle weakness (generalized): Secondary | ICD-10-CM | POA: Diagnosis not present

## 2019-12-25 DIAGNOSIS — R488 Other symbolic dysfunctions: Secondary | ICD-10-CM | POA: Diagnosis not present

## 2019-12-25 DIAGNOSIS — R262 Difficulty in walking, not elsewhere classified: Secondary | ICD-10-CM | POA: Diagnosis not present

## 2019-12-25 DIAGNOSIS — R2681 Unsteadiness on feet: Secondary | ICD-10-CM | POA: Diagnosis not present

## 2019-12-25 DIAGNOSIS — R41841 Cognitive communication deficit: Secondary | ICD-10-CM | POA: Diagnosis not present

## 2019-12-25 DIAGNOSIS — R296 Repeated falls: Secondary | ICD-10-CM | POA: Diagnosis not present

## 2019-12-27 ENCOUNTER — Emergency Department (HOSPITAL_COMMUNITY): Payer: Medicare Other

## 2019-12-27 ENCOUNTER — Inpatient Hospital Stay (HOSPITAL_COMMUNITY)
Admission: EM | Admit: 2019-12-27 | Discharge: 2019-12-30 | DRG: 640 | Disposition: A | Discharge status: Skilled Nursing Facility | Payer: Medicare Other | Source: Skilled Nursing Facility | Attending: Internal Medicine | Admitting: Internal Medicine

## 2019-12-27 ENCOUNTER — Encounter (HOSPITAL_COMMUNITY): Payer: Self-pay

## 2019-12-27 DIAGNOSIS — M47812 Spondylosis without myelopathy or radiculopathy, cervical region: Secondary | ICD-10-CM | POA: Diagnosis not present

## 2019-12-27 DIAGNOSIS — Z79899 Other long term (current) drug therapy: Secondary | ICD-10-CM

## 2019-12-27 DIAGNOSIS — G319 Degenerative disease of nervous system, unspecified: Secondary | ICD-10-CM | POA: Diagnosis not present

## 2019-12-27 DIAGNOSIS — I110 Hypertensive heart disease with heart failure: Secondary | ICD-10-CM | POA: Diagnosis present

## 2019-12-27 DIAGNOSIS — I739 Peripheral vascular disease, unspecified: Secondary | ICD-10-CM | POA: Diagnosis present

## 2019-12-27 DIAGNOSIS — E876 Hypokalemia: Secondary | ICD-10-CM | POA: Diagnosis not present

## 2019-12-27 DIAGNOSIS — R2681 Unsteadiness on feet: Secondary | ICD-10-CM | POA: Diagnosis not present

## 2019-12-27 DIAGNOSIS — I5032 Chronic diastolic (congestive) heart failure: Secondary | ICD-10-CM | POA: Diagnosis not present

## 2019-12-27 DIAGNOSIS — R0902 Hypoxemia: Secondary | ICD-10-CM | POA: Diagnosis not present

## 2019-12-27 DIAGNOSIS — I480 Paroxysmal atrial fibrillation: Secondary | ICD-10-CM | POA: Diagnosis not present

## 2019-12-27 DIAGNOSIS — R531 Weakness: Secondary | ICD-10-CM | POA: Diagnosis not present

## 2019-12-27 DIAGNOSIS — R0602 Shortness of breath: Secondary | ICD-10-CM | POA: Diagnosis not present

## 2019-12-27 DIAGNOSIS — R296 Repeated falls: Secondary | ICD-10-CM | POA: Diagnosis not present

## 2019-12-27 DIAGNOSIS — Z713 Dietary counseling and surveillance: Secondary | ICD-10-CM

## 2019-12-27 DIAGNOSIS — Z7901 Long term (current) use of anticoagulants: Secondary | ICD-10-CM

## 2019-12-27 DIAGNOSIS — E785 Hyperlipidemia, unspecified: Secondary | ICD-10-CM | POA: Diagnosis present

## 2019-12-27 DIAGNOSIS — Z888 Allergy status to other drugs, medicaments and biological substances status: Secondary | ICD-10-CM

## 2019-12-27 DIAGNOSIS — E669 Obesity, unspecified: Secondary | ICD-10-CM | POA: Diagnosis present

## 2019-12-27 DIAGNOSIS — M4319 Spondylolisthesis, multiple sites in spine: Secondary | ICD-10-CM | POA: Diagnosis not present

## 2019-12-27 DIAGNOSIS — J341 Cyst and mucocele of nose and nasal sinus: Secondary | ICD-10-CM | POA: Diagnosis not present

## 2019-12-27 DIAGNOSIS — I959 Hypotension, unspecified: Secondary | ICD-10-CM | POA: Diagnosis not present

## 2019-12-27 DIAGNOSIS — M25561 Pain in right knee: Secondary | ICD-10-CM | POA: Diagnosis present

## 2019-12-27 DIAGNOSIS — Z886 Allergy status to analgesic agent status: Secondary | ICD-10-CM

## 2019-12-27 DIAGNOSIS — I6782 Cerebral ischemia: Secondary | ICD-10-CM | POA: Diagnosis not present

## 2019-12-27 DIAGNOSIS — M6281 Muscle weakness (generalized): Secondary | ICD-10-CM | POA: Diagnosis not present

## 2019-12-27 DIAGNOSIS — Z6833 Body mass index (BMI) 33.0-33.9, adult: Secondary | ICD-10-CM

## 2019-12-27 DIAGNOSIS — Z20822 Contact with and (suspected) exposure to covid-19: Secondary | ICD-10-CM | POA: Diagnosis not present

## 2019-12-27 DIAGNOSIS — R262 Difficulty in walking, not elsewhere classified: Secondary | ICD-10-CM | POA: Diagnosis not present

## 2019-12-27 DIAGNOSIS — K449 Diaphragmatic hernia without obstruction or gangrene: Secondary | ICD-10-CM | POA: Diagnosis present

## 2019-12-27 DIAGNOSIS — J9601 Acute respiratory failure with hypoxia: Secondary | ICD-10-CM | POA: Diagnosis present

## 2019-12-27 DIAGNOSIS — Z880 Allergy status to penicillin: Secondary | ICD-10-CM

## 2019-12-27 DIAGNOSIS — R488 Other symbolic dysfunctions: Secondary | ICD-10-CM | POA: Diagnosis not present

## 2019-12-27 DIAGNOSIS — F411 Generalized anxiety disorder: Secondary | ICD-10-CM | POA: Diagnosis present

## 2019-12-27 DIAGNOSIS — E861 Hypovolemia: Secondary | ICD-10-CM | POA: Diagnosis not present

## 2019-12-27 DIAGNOSIS — Z8673 Personal history of transient ischemic attack (TIA), and cerebral infarction without residual deficits: Secondary | ICD-10-CM

## 2019-12-27 DIAGNOSIS — M4802 Spinal stenosis, cervical region: Secondary | ICD-10-CM | POA: Diagnosis not present

## 2019-12-27 DIAGNOSIS — Z87891 Personal history of nicotine dependence: Secondary | ICD-10-CM

## 2019-12-27 DIAGNOSIS — I10 Essential (primary) hypertension: Secondary | ICD-10-CM | POA: Diagnosis present

## 2019-12-27 DIAGNOSIS — Z823 Family history of stroke: Secondary | ICD-10-CM

## 2019-12-27 DIAGNOSIS — J3489 Other specified disorders of nose and nasal sinuses: Secondary | ICD-10-CM | POA: Diagnosis not present

## 2019-12-27 DIAGNOSIS — K219 Gastro-esophageal reflux disease without esophagitis: Secondary | ICD-10-CM | POA: Diagnosis present

## 2019-12-27 DIAGNOSIS — R41841 Cognitive communication deficit: Secondary | ICD-10-CM | POA: Diagnosis not present

## 2019-12-27 DIAGNOSIS — M25569 Pain in unspecified knee: Secondary | ICD-10-CM

## 2019-12-27 DIAGNOSIS — R739 Hyperglycemia, unspecified: Secondary | ICD-10-CM | POA: Diagnosis not present

## 2019-12-27 LAB — URINALYSIS, ROUTINE W REFLEX MICROSCOPIC
Bacteria, UA: NONE SEEN
Bilirubin Urine: NEGATIVE
Glucose, UA: NEGATIVE mg/dL
Hgb urine dipstick: NEGATIVE
Ketones, ur: NEGATIVE mg/dL
Nitrite: NEGATIVE
Protein, ur: NEGATIVE mg/dL
Specific Gravity, Urine: 1.017 (ref 1.005–1.030)
pH: 5 (ref 5.0–8.0)

## 2019-12-27 LAB — COMPREHENSIVE METABOLIC PANEL
ALT: 17 U/L (ref 0–44)
AST: 21 U/L (ref 15–41)
Albumin: 3.6 g/dL (ref 3.5–5.0)
Alkaline Phosphatase: 76 U/L (ref 38–126)
Anion gap: 13 (ref 5–15)
BUN: 25 mg/dL — ABNORMAL HIGH (ref 8–23)
CO2: 26 mmol/L (ref 22–32)
Calcium: 9.4 mg/dL (ref 8.9–10.3)
Chloride: 103 mmol/L (ref 98–111)
Creatinine, Ser: 0.95 mg/dL (ref 0.44–1.00)
GFR calc Af Amer: >60 mL/min (ref >60)
GFR calc non Af Amer: 53 mL/min — ABNORMAL LOW (ref >60)
Glucose, Bld: 115 mg/dL — ABNORMAL HIGH (ref 70–99)
Potassium: 2.9 mmol/L — ABNORMAL LOW (ref 3.5–5.1)
Sodium: 142 mmol/L (ref 135–145)
Total Bilirubin: 1 mg/dL (ref 0.3–1.2)
Total Protein: 6.4 g/dL — ABNORMAL LOW (ref 6.5–8.1)

## 2019-12-27 LAB — CBC WITH DIFFERENTIAL/PLATELET
Abs Immature Granulocytes: 0.02 10*3/uL (ref 0.00–0.07)
Basophils Absolute: 0 10*3/uL (ref 0.0–0.1)
Basophils Relative: 1 %
Eosinophils Absolute: 0.1 10*3/uL (ref 0.0–0.5)
Eosinophils Relative: 1 %
HCT: 46.2 % — ABNORMAL HIGH (ref 36.0–46.0)
Hemoglobin: 15.2 g/dL — ABNORMAL HIGH (ref 12.0–15.0)
Immature Granulocytes: 0 %
Lymphocytes Relative: 17 %
Lymphs Abs: 1.1 10*3/uL (ref 0.7–4.0)
MCH: 33 pg (ref 26.0–34.0)
MCHC: 32.9 g/dL (ref 30.0–36.0)
MCV: 100.4 fL — ABNORMAL HIGH (ref 80.0–100.0)
Monocytes Absolute: 0.5 10*3/uL (ref 0.1–1.0)
Monocytes Relative: 8 %
Neutro Abs: 4.8 10*3/uL (ref 1.7–7.7)
Neutrophils Relative %: 73 %
Platelets: 229 10*3/uL (ref 150–400)
RBC: 4.6 MIL/uL (ref 3.87–5.11)
RDW: 14.2 % (ref 11.5–15.5)
WBC: 6.6 10*3/uL (ref 4.0–10.5)
nRBC: 0 % (ref 0.0–0.2)

## 2019-12-27 LAB — MAGNESIUM: Magnesium: 1.8 mg/dL (ref 1.7–2.4)

## 2019-12-27 LAB — SARS CORONAVIRUS 2 BY RT PCR (HOSPITAL ORDER, PERFORMED IN ~~LOC~~ HOSPITAL LAB): SARS Coronavirus 2: NEGATIVE

## 2019-12-27 MED ORDER — DILTIAZEM HCL ER COATED BEADS 180 MG PO CP24
360.0000 mg | ORAL_CAPSULE | Freq: Every day | ORAL | Status: DC
  Administered 2019-12-28 – 2019-12-30 (×3): 360 mg via ORAL
  Filled 2019-12-27 (×4): qty 2

## 2019-12-27 MED ORDER — SODIUM CHLORIDE 0.9 % IV BOLUS
500.0000 mL | Freq: Once | INTRAVENOUS | Status: AC
  Administered 2019-12-27: 500 mL via INTRAVENOUS

## 2019-12-27 MED ORDER — POTASSIUM CHLORIDE CRYS ER 20 MEQ PO TBCR
40.0000 meq | EXTENDED_RELEASE_TABLET | Freq: Once | ORAL | Status: AC
  Administered 2019-12-27: 40 meq via ORAL
  Filled 2019-12-27: qty 2

## 2019-12-27 MED ORDER — PANTOPRAZOLE SODIUM 40 MG PO TBEC
40.0000 mg | DELAYED_RELEASE_TABLET | Freq: Every morning | ORAL | Status: DC
  Administered 2019-12-28 – 2019-12-29 (×2): 40 mg via ORAL
  Filled 2019-12-27 (×2): qty 1

## 2019-12-27 MED ORDER — ACETAMINOPHEN 500 MG PO TABS
500.0000 mg | ORAL_TABLET | Freq: Once | ORAL | Status: AC
  Administered 2019-12-27: 500 mg via ORAL
  Filled 2019-12-27: qty 1

## 2019-12-27 MED ORDER — APIXABAN 5 MG PO TABS
5.0000 mg | ORAL_TABLET | Freq: Two times a day (BID) | ORAL | Status: DC
  Administered 2019-12-27 – 2019-12-30 (×6): 5 mg via ORAL
  Filled 2019-12-27 (×6): qty 1

## 2019-12-27 MED ORDER — METOPROLOL SUCCINATE ER 50 MG PO TB24
50.0000 mg | ORAL_TABLET | Freq: Every morning | ORAL | Status: DC
  Administered 2019-12-28 – 2019-12-29 (×2): 50 mg via ORAL
  Filled 2019-12-27 (×2): qty 1

## 2019-12-27 MED ORDER — POLYSACCHARIDE IRON COMPLEX 150 MG PO CAPS
150.0000 mg | ORAL_CAPSULE | Freq: Every day | ORAL | Status: DC
  Administered 2019-12-28 – 2019-12-30 (×3): 150 mg via ORAL
  Filled 2019-12-27 (×3): qty 1

## 2019-12-27 MED ORDER — POTASSIUM CHLORIDE 10 MEQ/100ML IV SOLN
10.0000 meq | Freq: Once | INTRAVENOUS | Status: AC
  Administered 2019-12-27: 10 meq via INTRAVENOUS
  Filled 2019-12-27: qty 100

## 2019-12-27 MED ORDER — SODIUM CHLORIDE 0.9 % IV SOLN: INTRAVENOUS | Status: DC | PRN

## 2019-12-27 MED ORDER — DOCUSATE SODIUM 100 MG PO CAPS
200.0000 mg | ORAL_CAPSULE | Freq: Every day | ORAL | Status: DC
  Administered 2019-12-27 – 2019-12-30 (×4): 200 mg via ORAL
  Filled 2019-12-27 (×4): qty 2

## 2019-12-27 MED ORDER — ACETAMINOPHEN 500 MG PO TABS
500.0000 mg | ORAL_TABLET | Freq: Three times a day (TID) | ORAL | Status: DC | PRN
  Administered 2019-12-27 – 2019-12-30 (×6): 500 mg via ORAL
  Filled 2019-12-27 (×6): qty 1

## 2019-12-27 MED ORDER — FUROSEMIDE 40 MG PO TABS
40.0000 mg | ORAL_TABLET | Freq: Every day | ORAL | Status: DC
  Administered 2019-12-28: 40 mg via ORAL
  Filled 2019-12-27: qty 1

## 2019-12-27 MED ORDER — POTASSIUM CHLORIDE 10 MEQ/100ML IV SOLN
10.0000 meq | INTRAVENOUS | Status: AC
  Administered 2019-12-27 – 2019-12-28 (×2): 10 meq via INTRAVENOUS
  Filled 2019-12-27 (×2): qty 100

## 2019-12-27 NOTE — H&P (Signed)
History and Physical    Jacqueline [name] JQB:[id number] DOB: 03-21-1929 DOA: 12/27/2019  PCP: Lajean Manes, MD  Patient coming from: [facility] assisted living  I have personally briefly reviewed patient's old medical records in [place]  Chief Complaint: weakness  HPI: Jacqueline [name] is a 86 y.o. female with medical history significant for paroxysmal atrial fibrillation on Eliquis, chronic diastolic heart failure, hypertension, CVA, PAD and GI bleed who presents with concerns of worsening weakness.  Patient is a unreliable historian and family could not be reached for further history.  History obtained in its entirety through ED documentation.  Reportedly patient has been having increased weakness for the past several days.  She endorsed that about 2 to 3 weeks ago she had a fall and has been working with physical therapy and has right knee pain.  However unable to answer any other questions regarding new symptoms.  ED Course: She was afebrile, mildly tachypneic and was hypoxic down to 88% on room air requiring 3 L of O2 via nasal cannula.  However, chest x-ray is negative and is unsure whether patient has had any URI symptoms. CBC showed no leukocytosis.  She appears somewhat hemoconcentrated with hemoglobin of 15.2 and HCT of 46.  Potassium low at 2.9 without EKG changes.  CT head and cervical spine with no acute findings.  Review of Systems:  Unable to fully obtain given patient is a poor historian  Past Medical History:  Diagnosis Date  . Allergy, unspecified not elsewhere classified   . Anemia, unspecified   . Anxiety state, unspecified   . Atrial fibrillation with rapid ventricular response ([place]) 03/13/12  . Blood in stool   . Complication of anesthesia    "hard to wake me up after colonoscopy" (03/13/12)  . Degeneration of lumbar or lumbosacral intervertebral disc   . GERD (gastroesophageal reflux disease)   . H/O hiatal hernia   . Hemorrhage of gastrointestinal tract,  unspecified   . History of IBS   . Osteoarthrosis, unspecified whether generalized or localized, lower leg   . Other diseases of lung, not elsewhere classified   . Other dyspnea and respiratory abnormality   . Personal history of other diseases of digestive system   . Personal history of peptic ulcer disease   . Unspecified adverse effect of other drug, medicinal and biological substance(995.29)   . Unspecified cerebral artery occlusion with cerebral infarction 10/2008   "mini stroke" denies residual (03/13/12)  . Unspecified essential hypertension   . Unspecified paranoid state     Past Surgical History:  Procedure Laterality Date  . DILATION AND CURETTAGE OF UTERUS  1950's?  . ORIF HUMERUS FRACTURE Right 04/03/2014   Procedure: OPEN REDUCTION INTERNAL FIXATION (ORIF) RIGHT HUMERUS DISTAL FRACTURE;  Surgeon: Roseanne Kaufman, MD;  Location: [place];  Service: Orthopedics;  Laterality: Right;  . PILONIDAL CYST / SINUS EXCISION  1957  . SHOULDER ARTHROSCOPY  ~ 2000   right     reports that she quit smoking about 17 years ago. Her smoking use included cigarettes. She has a 40.00 pack-year smoking history. She has never used smokeless tobacco. She reports that she does not drink alcohol and does not use drugs. Social History  Allergies  Allergen Reactions  . Metoclopramide Hcl Other (See Comments)     Tremors  . Penicillins Itching and Other (See Comments)    "Allergic," per [facility] Has patient had a PCN reaction causing immediate rash, facial/tongue/throat swelling, SOB or lightheadedness with hypotension: Yes Has patient  had a PCN reaction causing severe rash involving mucus membranes or skin necrosis: Yes Has patient had a PCN reaction that required hospitalization:No Has patient had a PCN reaction occurring within the last 10 years: No If all of the above answers are "NO", then may proceed with Cephalosporin use.   . Aspirin Other (See Comments)    "gave me stomach aches; made me  bleed" (03/13/12)- "Allergic," per [facility]  . Zetia [Ezetimibe] Other (See Comments)    Stomach and back pain    Family History  Problem Relation Age of Onset  . Diabetes Mother   . CVA Mother   . CVA Father   . CVA Maternal Grandmother      Prior to Admission medications   Medication Sig Start Date End Date Taking? Authorizing Provider  acetaminophen (TYLENOL) 500 MG tablet Take 500 mg by mouth every 8 (eight) hours as needed (for pain).   Yes [provider]  acetaminophen (TYLENOL) 650 MG CR tablet Take 1,300 mg by mouth 2 (two) times daily.   Yes [provider]  ALPRAZolam Duanne Moron) 0.5 MG tablet Take 0.5 mg by mouth See admin instructions. Take 0.5 mg by mouth every eight hours and hold for sedation   Yes [provider]  apixaban (ELIQUIS) 5 MG TABS tablet Take 1 tablet (5 mg total) by mouth 2 (two) times daily. 06/20/14  Yes Regalado, Belkys A, MD  cholecalciferol (VITAMIN D3) 25 MCG (1000 UNIT) tablet Take 1,000 Units by mouth daily.   Yes [provider]  diltiazem (CARDIZEM CD) 360 MG 24 hr capsule Take 360 mg by mouth daily.   Yes [provider]  docusate sodium (COLACE) 100 MG capsule Take 200 mg by mouth daily.   Yes [provider]  furosemide (LASIX) 40 MG tablet Take 1 tablet (40 mg total) by mouth daily. 12/27/18 12/27/19 Yes Rai, Ripudeep K, MD  iron polysaccharides (NIFEREX) 150 MG capsule Take 1 capsule (150 mg total) by mouth daily. 12/28/18  Yes Rai, Ripudeep K, MD  metoprolol succinate (TOPROL-XL) 50 MG 24 hr tablet Take 50 mg by mouth in the morning.  06/28/19  Yes [provider]  Multiple Vitamin (MULTIVITAMIN WITH MINERALS) TABS tablet Take 1 tablet by mouth daily.    Yes [provider]  pantoprazole (PROTONIX) 40 MG tablet TAKE 1 TABLET EVERY MORNING Patient taking differently: Take 40 mg by mouth in the morning.    Yes Hendricks Limes, MD  Polyethyl Glycol-Propyl Glycol (SYSTANE) 0.4-0.3 % SOLN  Place 2 drops into both eyes every 8 (eight) hours as needed (for dry eyes).   Yes [provider]  polyethylene glycol (MIRALAX / GLYCOLAX) 17 g packet Take 17 g by mouth daily as needed for mild constipation ([place]).   Yes [provider]  ALPRAZolam (XANAX) 0.25 MG tablet Take 0.5 tablets (0.125 mg total) by mouth 2 (two) times daily as needed for anxiety. Patient not taking: Reported on 12/27/2019 10/28/17   Domenic Polite, MD  bisacodyl (DULCOLAX) 10 MG suppository Place 10 mg rectally daily as needed for mild constipation.  Patient not taking: Reported on 12/27/2019    [provider]  Cholecalciferol (VITAMIN D3) 50 MCG (2000 UT) capsule Take 2,000 Units by mouth daily. Patient not taking: Reported on 12/27/2019    [provider]  diltiazem (DILACOR XR) 180 MG 24 hr capsule Take 180 mg by mouth 2 (two) times daily.  Patient not taking: Reported on 12/27/2019  [provider]  hypromellose (SYSTANE OVERNIGHT THERAPY) 0.3 % GEL ophthalmic ointment Place 1 application into both eyes 4 (four) times daily as needed for dry eyes. Patient not taking: Reported on 12/27/2019    [provider]  senna (SENOKOT) 8.6 MG TABS tablet Take 1 tablet by mouth 2 (two) times daily. Patient not taking: Reported on 12/27/2019    [provider]    Physical Exam: Vitals:   12/27/19 1530 12/27/19 1600 12/27/19 1930 12/27/19 2000  BP: 104/63 117/74 108/83 (!) 143/119  Pulse: 73 71 64 64  Resp: (!) 22 (!) 21 (!) 23 17  Temp:      TempSrc:      SpO2: 97% 96%    Weight:      Height:        Constitutional: NAD, calm, comfortable, elderly female laying at 20 degree incline in bed and appears somewhat dazed and confused Vitals:   12/27/19 1530 12/27/19 1600 12/27/19 1930 12/27/19 2000  BP: 104/63 117/74 108/83 (!) 143/119  Pulse: 73 71 64 64  Resp: (!) 22 (!) 21 (!) 23 17  Temp:      TempSrc:      SpO2: 97% 96%    Weight:       Height:       Eyes: PERRL, lids and conjunctivae normal ENMT: Mucous membranes are moist. Neck: normal, supple Respiratory: clear to auscultation bilaterally, no wheezing, no crackles. Normal respiratory effort on 2 L of O2 via nasal cannula. No accessory muscle use.  Cardiovascular: Regular rate and rhythm, no murmurs / rubs / gallops. No extremity edema. 2+ pedal pulses.  Abdomen: no tenderness, no masses palpated.  Bowel sounds positive.  Musculoskeletal: no clubbing / cyanosis. No joint deformity upper and lower extremities. Good ROM, no contractures. Normal muscle tone.  Skin: no rashes, lesions, ulcers. No induration Neurologic: CN 2-12 grossly intact. Sensation intact. Strength 5/5 in all 4.  Psychiatric: Normal judgment and insight. Alert and oriented x self, place and current event but not time.  Has trouble answering other more complex questions.  Normal mood.     Labs on Admission: I have personally reviewed following labs and imaging studies  CBC: Recent Labs  Lab 12/27/19 1400  WBC 6.6  NEUTROABS 4.8  HGB 15.2*  HCT 46.2*  MCV 100.4*  PLT 956   Basic Metabolic Panel: Recent Labs  Lab 12/27/19 1400  NA 142  K 2.9*  CL 103  CO2 26  GLUCOSE 115*  BUN 25*  CREATININE 0.95  CALCIUM 9.4  MG 1.8   GFR: Estimated Creatinine Clearance: 38.5 mL/min (by C-G formula based on SCr of 0.95 mg/dL). Liver Function Tests: Recent Labs  Lab 12/27/19 1400  AST 21  ALT 17  ALKPHOS 76  BILITOT 1.0  PROT 6.4*  ALBUMIN 3.6   No results for input(s): LIPASE, AMYLASE in the last 168 hours. No results for input(s): AMMONIA in the last 168 hours. Coagulation Profile: No results for input(s): INR, PROTIME in the last 168 hours. Cardiac Enzymes: No results for input(s): CKTOTAL, CKMB, CKMBINDEX, TROPONINI in the last 168 hours. BNP (last 3 results) No results for input(s): PROBNP in the last 8760 hours. HbA1C: No results for input(s): HGBA1C in the last 72  hours. CBG: No results for input(s): GLUCAP in the last 168 hours. Lipid Profile: No results for input(s): CHOL, HDL, LDLCALC, TRIG, CHOLHDL, LDLDIRECT in the last 72 hours. Thyroid Function Tests: No results for input(s): TSH, T4TOTAL, FREET4, T3FREE,  THYROIDAB in the last 72 hours. Anemia Panel: No results for input(s): VITAMINB12, FOLATE, FERRITIN, TIBC, IRON, RETICCTPCT in the last 72 hours. Urine analysis:    Component Value Date/Time   COLORURINE YELLOW 12/27/2019 [place] 12/27/2019 1633   LABSPEC 1.017 12/27/2019 1633   PHURINE 5.0 12/27/2019 1633   GLUCOSEU NEGATIVE 12/27/2019 1633   HGBUR NEGATIVE 12/27/2019 1633   BILIRUBINUR NEGATIVE 12/27/2019 1633   KETONESUR NEGATIVE 12/27/2019 1633   PROTEINUR NEGATIVE 12/27/2019 1633   UROBILINOGEN 0.2 06/18/2014 1950   NITRITE NEGATIVE 12/27/2019 1633   LEUKOCYTESUR TRACE (A) 12/27/2019 1633    Radiological Exams on Admission: CT Head Wo Contrast  Result Date: 12/27/2019 CLINICAL DATA:  Weakness for several days EXAM: CT HEAD WITHOUT CONTRAST CT CERVICAL SPINE WITHOUT CONTRAST TECHNIQUE: Multidetector CT imaging of the head and cervical spine was performed following the standard protocol without intravenous contrast. Multiplanar CT image reconstructions of the cervical spine were also generated. COMPARISON:  08/05/2019 FINDINGS: CT HEAD FINDINGS Brain: No evidence of acute infarction, hemorrhage, hydrocephalus, extra-axial collection or mass lesion/mass effect. Chronic atrophic and ischemic changes are noted. Vascular: No hyperdense vessel or unexpected calcification. Small amounts of air are noted within basilar venous structures consistent with recent IV initiation Skull: Normal. Negative for fracture or focal lesion. Sinuses/Orbits: Mild mucosal retention cysts are noted within the sphenoid sinus. Other: None. CT CERVICAL SPINE FINDINGS Alignment: Mild straightening of the normal cervical lordosis is noted. Mild  degenerative anterolisthesis of C4 on C5 and C7 on T1 is seen. Skull base and vertebrae: 7 cervical segments are well visualized. Multilevel facet hypertrophic changes are seen. Multilevel disc space narrowing with osteophytic changes are seen worst at C5-6 and C6-7. The odontoid is within normal limits. Soft tissues and spinal canal: Surrounding soft tissue structures are within normal limits. Upper chest: Visualized lung apices are unremarkable. Other: None IMPRESSION: CT of the head: Chronic atrophic and ischemic changes without acute abnormality. CT of the cervical spine: Multilevel degenerative changes without acute abnormality. Electronically Signed   By: Inez Catalina M.D.   On: 12/27/2019 18:02   CT Cervical Spine Wo Contrast  Result Date: 12/27/2019 CLINICAL DATA:  Weakness for several days EXAM: CT HEAD WITHOUT CONTRAST CT CERVICAL SPINE WITHOUT CONTRAST TECHNIQUE: Multidetector CT imaging of the head and cervical spine was performed following the standard protocol without intravenous contrast. Multiplanar CT image reconstructions of the cervical spine were also generated. COMPARISON:  08/05/2019 FINDINGS: CT HEAD FINDINGS Brain: No evidence of acute infarction, hemorrhage, hydrocephalus, extra-axial collection or mass lesion/mass effect. Chronic atrophic and ischemic changes are noted. Vascular: No hyperdense vessel or unexpected calcification. Small amounts of air are noted within basilar venous structures consistent with recent IV initiation Skull: Normal. Negative for fracture or focal lesion. Sinuses/Orbits: Mild mucosal retention cysts are noted within the sphenoid sinus. Other: None. CT CERVICAL SPINE FINDINGS Alignment: Mild straightening of the normal cervical lordosis is noted. Mild degenerative anterolisthesis of C4 on C5 and C7 on T1 is seen. Skull base and vertebrae: 7 cervical segments are well visualized. Multilevel facet hypertrophic changes are seen. Multilevel disc space narrowing with  osteophytic changes are seen worst at C5-6 and C6-7. The odontoid is within normal limits. Soft tissues and spinal canal: Surrounding soft tissue structures are within normal limits. Upper chest: Visualized lung apices are unremarkable. Other: None IMPRESSION: CT of the head: Chronic atrophic and ischemic changes without acute abnormality. CT of the cervical spine: Multilevel degenerative changes without acute abnormality.  Electronically Signed   By: Inez Catalina M.D.   On: 12/27/2019 18:02   DG Chest Portable 1 View  Result Date: 12/27/2019 CLINICAL DATA:  Shortness of breath EXAM: PORTABLE CHEST 1 VIEW COMPARISON:  August 05, 2019 FINDINGS: There is no edema or airspace opacity. Heart is mildly enlarged with pulmonary vascularity normal. There is a large hiatal type hernia. There is aortic atherosclerosis. There is advanced arthropathy in the shoulders. There is erosion in the right humeral head consistent with chronic avascular necrosis in this area. IMPRESSION: No edema or airspace opacity. Stable cardiac silhouette. Large hiatal type hernia evident. 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 Aortic Atherosclerosis (ICD10-I70.0). Electronically Signed   By: Lowella Grip III M.D.   On: 12/27/2019 14:26      Assessment/Plan  Acute hypoxic respiratory failure Unclear etiology but suspect could be due to deconditioning.  Pt afebrile with negative Chest x-ray without any leukocytosis to suggest infection.  No cardiac findings from unchanged EKG and her being euvolemic on exam.   Incentive spirometry/flutter valve Wean as tolerated  Weakness/deconditioning/hypovolemic  PT evaluation Has good Rt knee ROM- does not think it needs X-ray at this time Hold Xanax- pt noted to get it q8hrs at nursing facility Give one 500cc bolus NS   Hypokalemia replete and recheck in the morning  Paroxysmal atrial fibrillation Rate  controlled Continue Eliquis and diltiazem  Chronic diastolic heart failure Euvolemic on exam Continue Lasix and metoprolol  Hypertension Stable  DVT prophylaxis:Eliquis Code Status: Full Family Communication: Plan discussed with patient at bedside  disposition Plan: Home with observation Consults called:  Admission status: Observation Status is: Observation  The patient remains OBS appropriate and will d/c before 2 midnights.  Dispo: The patient is from: ALF              Anticipated d/c is to: ALF              Anticipated d/c date is: 1 day              Patient currently is not medically stable to d/c.         Orene Desanctis DO [facility]   If 7PM-7AM, please contact night-coverage [email or web address]   12/27/2019, 9:13 PM

## 2019-12-27 NOTE — ED Triage Notes (Signed)
Pt BIB [facility] from [facility] living for eval of weakness x several days. Pt denies pain, endorses ongoing weakness. Hx of chronic pain, unchanged.

## 2019-12-27 NOTE — ED Provider Notes (Signed)
[facility] EMERGENCY DEPARTMENT Provider Note   CSN: [id number] Arrival date & time: 12/27/19  1329     History Chief Complaint  Patient presents with  . Weakness    Jacqueline [name] is a 86 y.o. female.  The history is provided by the patient and a relative (Son).        Jacqueline [name] is a 86 y.o. female, with a history of GERD, HTN, A. fib, presenting to the ED from an assisted living facility with increased, generalized weakness for the past 2 days. She endorses cough for the past few days.  She complains of pain in her neck that has been present since she sustained a fall 2 weeks ago.  Denies fever/chills, persistent shortness of breath, chest pain, lower extremity edema, abdominal pain, syncope, dizziness, urinary symptoms, N/V/C/D, or any other complaints.    Past Medical History:  Diagnosis Date  . Allergy, unspecified not elsewhere classified   . Anemia, unspecified   . Anxiety state, unspecified   . Atrial fibrillation with rapid ventricular response ([place]) 03/13/12  . Blood in stool   . Complication of anesthesia    "hard to wake me up after colonoscopy" (03/13/12)  . Degeneration of lumbar or lumbosacral intervertebral disc   . GERD (gastroesophageal reflux disease)   . H/O hiatal hernia   . Hemorrhage of gastrointestinal tract, unspecified   . History of IBS   . Osteoarthrosis, unspecified whether generalized or localized, lower leg   . Other diseases of lung, not elsewhere classified   . Other dyspnea and respiratory abnormality   . Personal history of other diseases of digestive system   . Personal history of peptic ulcer disease   . Unspecified adverse effect of other drug, medicinal and biological substance(995.29)   . Unspecified cerebral artery occlusion with cerebral infarction 10/2008   "mini stroke" denies residual (03/13/12)  . Unspecified essential hypertension   . Unspecified paranoid state     Patient Active Problem List    Diagnosis Date Noted  . Generalized weakness 08/05/2019  . Macrocytosis 08/05/2019  . Acute on chronic kidney failure ([place]) 08/05/2019  . Acute respiratory failure with hypoxia ([place]) 12/24/2018  . Acute on chronic diastolic CHF (congestive heart failure) ([place]) 12/24/2018  . Dyspnea 12/24/2018  . Chronic knee pain 12/24/2018  . History of multiple cerebrovascular accidents (CVAs) 12/24/2018  . GERD (gastroesophageal reflux disease) 11/04/2017  . Altered mental status 11/03/2017  . CVA (cerebral vascular accident) ([place]) 10/25/2017  . Benign essential HTN   . History of TIA (transient ischemic attack)   . TIA (transient ischemic attack) 10/24/2017  . Cerebral infarction due to embolism of right middle cerebral artery ([place])   . HLD (hyperlipidemia)   . Acute ischemic right MCA stroke ([place]) 06/18/2014  . Facial droop   . Displaced fracture of shaft of left humerus 04/03/2014  . Right arm fracture 04/01/2014  . Displaced spiral fracture of shaft of humerus   . Peripheral arterial disease ([place]) 10/03/2013  . Chronic diastolic CHF (congestive heart failure) ([place]) 08/08/2013  . Chronic venous insufficiency 04/02/2013  . UTI (urinary tract infection) 01/13/2013  . Hypokalemia 01/13/2013  . PAF (paroxysmal atrial fibrillation) ([place]) 10/03/2012  . CAD (coronary artery disease) of artery bypass graft 04/05/2012  . Atrial fibrillation with RVR ([place]) 03/13/2012  . Need for prophylactic vaccination and inoculation against influenza 02/14/2012  . ABDOMINAL WALL HERNIA 01/21/2010  . PARESTHESIA 05/04/2009  . SKIN LESION 01/28/2009  . Nocturia 01/28/2009  .  DYSPNEA 01/01/2009  . CVA (cerebral infarction) 11/17/2008  . ALLERGY 10/27/2008  . PAROXYSMAL NOCTURNAL DYSPNEA 10/03/2008  . Osteoarthrosis, unspecified whether generalized or localized, involving lower leg 09/03/2008  . RESTRICTIVE LUNG DISEASE 05/26/2008  . Symptomatic anemia 01/08/2008  . UNS ADVRS EFF OTH RX MEDICINAL&BIOLOGICAL  SBST[facility] 11/09/2007  . HEMATOCHEZIA 07/18/2007  . PARANOIA 06/01/2007  . Anxiety 01/23/2007  . Hypertensive heart disease with CHF ([place]) 01/23/2007  . GI BLEEDING 01/23/2007  . Osteoarthritis 01/23/2007  . DEGENERATIVE DISC DISEASE, LUMBAR SPINE 01/23/2007  . History of peptic ulcer disease 01/23/2007  . Personal history of other diseases of digestive system 01/23/2007    Past Surgical History:  Procedure Laterality Date  . DILATION AND CURETTAGE OF UTERUS  1950's?  . ORIF HUMERUS FRACTURE Right 04/03/2014   Procedure: OPEN REDUCTION INTERNAL FIXATION (ORIF) RIGHT HUMERUS DISTAL FRACTURE;  Surgeon: Roseanne Kaufman, MD;  Location: [facility];  Service: Orthopedics;  Laterality: Right;  . PILONIDAL CYST / SINUS EXCISION  1957  . SHOULDER ARTHROSCOPY  ~ 2000   right     OB History   No obstetric history on file.     Family History  Problem Relation Age of Onset  . Diabetes Mother   . CVA Mother   . CVA Father   . CVA Maternal Grandmother     Social History   Tobacco Use  . Smoking status: Former Smoker    Packs/day: 1.00    Years: 40.00    Pack years: 40.00    Types: Cigarettes    Quit date: 05/02/2002    Years since quitting: 17.6  . Smokeless tobacco: Never Used  Substance Use Topics  . Alcohol use: No  . Drug use: No    Home Medications Prior to Admission medications   Medication Sig Start Date End Date Taking? Authorizing Provider  acetaminophen (TYLENOL) 500 MG tablet Take 500 mg by mouth every 8 (eight) hours as needed (for pain).   Yes [provider]  acetaminophen (TYLENOL) 650 MG CR tablet Take 1,300 mg by mouth 2 (two) times daily.   Yes [provider]  ALPRAZolam Duanne Moron) 0.5 MG tablet Take 0.5 mg by mouth See admin instructions. Take 0.5 mg by mouth every eight hours and hold for sedation   Yes [provider]  apixaban (ELIQUIS) 5 MG TABS tablet Take 1 tablet (5 mg total) by mouth 2 (two) times daily. 06/20/14  Yes Regalado, Belkys A,  MD  cholecalciferol (VITAMIN D3) 25 MCG (1000 UNIT) tablet Take 1,000 Units by mouth daily.   Yes [provider]  diltiazem (CARDIZEM CD) 360 MG 24 hr capsule Take 360 mg by mouth daily.   Yes [provider]  docusate sodium (COLACE) 100 MG capsule Take 200 mg by mouth daily.   Yes [provider]  furosemide (LASIX) 40 MG tablet Take 1 tablet (40 mg total) by mouth daily. 12/27/18 12/27/19 Yes Rai, Ripudeep K, MD  iron polysaccharides (NIFEREX) 150 MG capsule Take 1 capsule (150 mg total) by mouth daily. 12/28/18  Yes Rai, Ripudeep K, MD  metoprolol succinate (TOPROL-XL) 50 MG 24 hr tablet Take 50 mg by mouth in the morning.  06/28/19  Yes [provider]  Multiple Vitamin (MULTIVITAMIN WITH MINERALS) TABS tablet Take 1 tablet by mouth daily.    Yes [provider]  pantoprazole (PROTONIX) 40 MG tablet TAKE 1 TABLET EVERY MORNING Patient taking differently: Take 40 mg by mouth in the morning.    Yes Linna Darner,  Darrick Penna, MD  Polyethyl Glycol-Propyl Glycol (SYSTANE) 0.4-0.3 % SOLN Place 2 drops into both eyes every 8 (eight) hours as needed (for dry eyes).   Yes [provider]  polyethylene glycol (MIRALAX / GLYCOLAX) 17 g packet Take 17 g by mouth daily as needed for mild constipation ([place]).   Yes [provider]  ALPRAZolam (XANAX) 0.25 MG tablet Take 0.5 tablets (0.125 mg total) by mouth 2 (two) times daily as needed for anxiety. Patient not taking: Reported on 12/27/2019 10/28/17   Domenic Polite, MD  bisacodyl (DULCOLAX) 10 MG suppository Place 10 mg rectally daily as needed for mild constipation.  Patient not taking: Reported on 12/27/2019    [provider]  Cholecalciferol (VITAMIN D3) 50 MCG (2000 UT) capsule Take 2,000 Units by mouth daily. Patient not taking: Reported on 12/27/2019    [provider]  diltiazem (DILACOR XR) 180 MG 24 hr capsule Take 180 mg by mouth 2 (two) times daily.  Patient not  taking: Reported on 12/27/2019    [provider]  hypromellose (SYSTANE OVERNIGHT THERAPY) 0.3 % GEL ophthalmic ointment Place 1 application into both eyes 4 (four) times daily as needed for dry eyes. Patient not taking: Reported on 12/27/2019    [provider]  senna (SENOKOT) 8.6 MG TABS tablet Take 1 tablet by mouth 2 (two) times daily. Patient not taking: Reported on 12/27/2019    [provider]    Allergies    Metoclopramide hcl, Penicillins, Aspirin, and Zetia [ezetimibe]  Review of Systems   Review of Systems  Constitutional: Negative for chills, diaphoresis and fever.  Respiratory: Positive for cough. Negative for shortness of breath.   Cardiovascular: Negative for chest pain and leg swelling.  Gastrointestinal: Negative for abdominal pain, constipation, diarrhea, nausea and vomiting.  Genitourinary: Negative for dysuria, flank pain, frequency and hematuria.  Musculoskeletal: Positive for neck pain. Negative for back pain.  Neurological: Positive for weakness. Negative for dizziness, syncope, light-headedness, numbness and headaches.  All other systems reviewed and are negative.   Physical Exam Updated Vital Signs BP 117/74   Pulse 71   Temp 98.8 F (37.1 C) (Oral)   Resp (!) 21   Ht 5\' 1"  (1.549 m)   Wt 80 kg   SpO2 96%   BMI 33.32 kg/m   Physical Exam Vitals and nursing note reviewed.  Constitutional:      General: She is not in acute distress.    Appearance: She is well-developed. She is not diaphoretic.  HENT:     Head: Normocephalic and atraumatic.     Mouth/Throat:     Mouth: Mucous membranes are moist.     Pharynx: Oropharynx is clear.  Eyes:     Conjunctiva/sclera: Conjunctivae normal.  Neck:   Cardiovascular:     Rate and Rhythm: Normal rate and regular rhythm.     Pulses: Normal pulses.          Radial pulses are 2+ on the right side and 2+ on the left side.       Posterior tibial pulses are 2+ on the right side and 2+  on the left side.     Heart sounds: Normal heart sounds.     Comments: Tactile temperature in the extremities appropriate and equal bilaterally. Pulmonary:     Effort: Pulmonary effort is normal. No respiratory distress.     Breath sounds: Normal breath sounds.     Comments: Patient has some tachypnea with movement.  SPO2  drops to 89 to 90% on room air. Abdominal:     Palpations: Abdomen is soft.     Tenderness: There is no abdominal tenderness. There is no guarding.  Musculoskeletal:     Cervical back: Neck supple.     Right lower leg: No edema.     Left lower leg: No edema.     Comments: I palpated and ranged the patient's lower extremities.  No noted pain, tenderness, deformity, or instability was noted.  Lymphadenopathy:     Cervical: No cervical adenopathy.  Skin:    General: Skin is warm and dry.  Neurological:     Mental Status: She is alert.     Comments: No noted acute cognitive deficit. Sensation grossly intact to light touch in the extremities.   Grip strengths equal bilaterally.   Coordination intact.  Cranial nerves III-XII grossly intact.  Handles oral secretions without noted difficulty.  No noted phonation or speech deficit. No facial droop.   Patient is generally weak.  She is unable to even hold herself up in bed.  She denies dizziness, she states she just feels quite weak.  Psychiatric:        Mood and Affect: Mood and affect normal.        Speech: Speech normal.        Behavior: Behavior normal.     ED Results / Procedures / Treatments   Labs (all labs ordered are listed, but only abnormal results are displayed) Labs Reviewed  URINALYSIS, ROUTINE W REFLEX MICROSCOPIC - Abnormal; Notable for the following components:      Result Value   Leukocytes,Ua TRACE (*)    All other components within normal limits  COMPREHENSIVE METABOLIC PANEL - Abnormal; Notable for the following components:   Potassium 2.9 (*)    Glucose, Bld 115 (*)    BUN 25 (*)    Total  Protein 6.4 (*)    GFR calc non Af Amer 53 (*)    All other components within normal limits  CBC WITH DIFFERENTIAL/PLATELET - Abnormal; Notable for the following components:   Hemoglobin 15.2 (*)    HCT 46.2 (*)    MCV 100.4 (*)    All other components within normal limits  SARS CORONAVIRUS 2 BY RT PCR (HOSPITAL ORDER, [facility])  URINE CULTURE  MAGNESIUM    EKG None  ED ECG REPORT   Date: 12/27/2019  Rate: 81  Rhythm: Sinus rhythm  QRS Axis: left  Intervals: normal  ST/T Wave abnormalities: normal  Conduction Disutrbances:none  Narrative Interpretation:   Old EKG Reviewed: unchanged  I have personally reviewed the EKG tracing and agree with the computerized printout as noted.   Radiology CT Head Wo Contrast  Result Date: 12/27/2019 CLINICAL DATA:  Weakness for several days EXAM: CT HEAD WITHOUT CONTRAST CT CERVICAL SPINE WITHOUT CONTRAST TECHNIQUE: Multidetector CT imaging of the head and cervical spine was performed following the standard protocol without intravenous contrast. Multiplanar CT image reconstructions of the cervical spine were also generated. COMPARISON:  08/05/2019 FINDINGS: CT HEAD FINDINGS Brain: No evidence of acute infarction, hemorrhage, hydrocephalus, extra-axial collection or mass lesion/mass effect. Chronic atrophic and ischemic changes are noted. Vascular: No hyperdense vessel or unexpected calcification. Small amounts of air are noted within basilar venous structures consistent with recent IV initiation Skull: Normal. Negative for fracture or focal lesion. Sinuses/Orbits: Mild mucosal retention cysts are noted within the sphenoid sinus. Other: None. CT CERVICAL SPINE FINDINGS Alignment: Mild straightening of  the normal cervical lordosis is noted. Mild degenerative anterolisthesis of C4 on C5 and C7 on T1 is seen. Skull base and vertebrae: 7 cervical segments are well visualized. Multilevel facet hypertrophic changes are seen.  Multilevel disc space narrowing with osteophytic changes are seen worst at C5-6 and C6-7. The odontoid is within normal limits. Soft tissues and spinal canal: Surrounding soft tissue structures are within normal limits. Upper chest: Visualized lung apices are unremarkable. Other: None IMPRESSION: CT of the head: Chronic atrophic and ischemic changes without acute abnormality. CT of the cervical spine: Multilevel degenerative changes without acute abnormality. Electronically Signed   By: Inez Catalina M.D.   On: 12/27/2019 18:02   CT Cervical Spine Wo Contrast  Result Date: 12/27/2019 CLINICAL DATA:  Weakness for several days EXAM: CT HEAD WITHOUT CONTRAST CT CERVICAL SPINE WITHOUT CONTRAST TECHNIQUE: Multidetector CT imaging of the head and cervical spine was performed following the standard protocol without intravenous contrast. Multiplanar CT image reconstructions of the cervical spine were also generated. COMPARISON:  08/05/2019 FINDINGS: CT HEAD FINDINGS Brain: No evidence of acute infarction, hemorrhage, hydrocephalus, extra-axial collection or mass lesion/mass effect. Chronic atrophic and ischemic changes are noted. Vascular: No hyperdense vessel or unexpected calcification. Small amounts of air are noted within basilar venous structures consistent with recent IV initiation Skull: Normal. Negative for fracture or focal lesion. Sinuses/Orbits: Mild mucosal retention cysts are noted within the sphenoid sinus. Other: None. CT CERVICAL SPINE FINDINGS Alignment: Mild straightening of the normal cervical lordosis is noted. Mild degenerative anterolisthesis of C4 on C5 and C7 on T1 is seen. Skull base and vertebrae: 7 cervical segments are well visualized. Multilevel facet hypertrophic changes are seen. Multilevel disc space narrowing with osteophytic changes are seen worst at C5-6 and C6-7. The odontoid is within normal limits. Soft tissues and spinal canal: Surrounding soft tissue structures are within normal  limits. Upper chest: Visualized lung apices are unremarkable. Other: None IMPRESSION: CT of the head: Chronic atrophic and ischemic changes without acute abnormality. CT of the cervical spine: Multilevel degenerative changes without acute abnormality. Electronically Signed   By: Inez Catalina M.D.   On: 12/27/2019 18:02   DG Chest Portable 1 View  Result Date: 12/27/2019 CLINICAL DATA:  Shortness of breath EXAM: PORTABLE CHEST 1 VIEW COMPARISON:  August 05, 2019 FINDINGS: There is no edema or airspace opacity. Heart is mildly enlarged with pulmonary vascularity normal. There is a large hiatal type hernia. There is aortic atherosclerosis. There is advanced arthropathy in the shoulders. There is erosion in the right humeral head consistent with chronic avascular necrosis in this area. IMPRESSION: No edema or airspace opacity. Stable cardiac silhouette. Large hiatal type hernia evident. 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 Aortic Atherosclerosis (ICD10-I70.0). Electronically Signed   By: Lowella Grip III M.D.   On: 12/27/2019 14:26    Procedures Procedures (including critical care time)  Medications Ordered in ED Medications  acetaminophen (TYLENOL) tablet 500 mg (500 mg Oral Given 12/27/19 1452)  potassium chloride SA (KLOR-CON) CR tablet 40 mEq (40 mEq Oral Given 12/27/19 1617)  potassium chloride 10 mEq in 100 mL IVPB (10 mEq Intravenous New Bag/Given 12/27/19 1617)    ED Course  I have reviewed the triage vital signs and the nursing notes.  Pertinent labs & imaging results that were available during my care of the patient were reviewed by me and considered in my medical decision making (see chart for details).  Clinical Course as of Dec 27 2154  Fri Dec 27, 2019  1430 Spoke with patient's son, [name], at the bedside.  States they have been concerned for generalized weakness in the patient over the last 2 days.   She seems to have been moving more slowly and less interested in regular activities.   [SJ]  [place] with Dr. Flossie Buffy, hospitalist.  Agrees to admit the patient.   [SJ]    Clinical Course User Index [SJ] Jamilee Lafosse, Helane Gunther, PA-C   MDM Rules/Calculators/A&P                          Patient presents with generalized weakness over the last couple days. She does not seem to have focal neurologic deficits, however, she is weak enough that she is unable to safely get out of bed without quite a bit of help  Additionally, patient presented with hypoxia with SPO2 down to 88% on room air.  She does seem to have some tachypnea, especially with movement.  Requiring 2 L supplemental O2 to maintain adequate SPO2. Nontoxic appearing, afebrile, not hypotensive, not tachycardic.  I personally reviewed and interpreted the patient's labs and imaging studies. She does have hypokalemia that appears to be acute.  Due to the patient's generalized weakness, hypokalemia, and hypoxia, patient could benefit from admission.  Findings and plan of care discussed with Deno Etienne, DO. Dr. Tyrone Nine personally evaluated and examined this patient.   Vitals:   12/27/19 1340 12/27/19 1430 12/27/19 1530 12/27/19 1600  BP: 119/76 119/79 104/63 117/74  Pulse: 81 78 73 71  Resp: (!) 24 (!) 26 (!) 22 (!) 21  Temp: 98.8 F (37.1 C)     TempSrc: Oral     SpO2: 96% 96% 97% 96%  Weight: 80 kg     Height: 5\' 1"  (1.549 m)        Final Clinical Impression(s) / ED Diagnoses Final diagnoses:  Generalized weakness  Hypokalemia    Rx / DC Orders ED Discharge Orders    None       Layla Maw 12/27/19 2158    Deno Etienne, DO 12/30/19 [phone]

## 2019-12-28 ENCOUNTER — Observation Stay (HOSPITAL_COMMUNITY): Payer: Medicare Other

## 2019-12-28 DIAGNOSIS — M2550 Pain in unspecified joint: Secondary | ICD-10-CM | POA: Diagnosis not present

## 2019-12-28 DIAGNOSIS — E669 Obesity, unspecified: Secondary | ICD-10-CM | POA: Diagnosis present

## 2019-12-28 DIAGNOSIS — I10 Essential (primary) hypertension: Secondary | ICD-10-CM | POA: Diagnosis not present

## 2019-12-28 DIAGNOSIS — J9601 Acute respiratory failure with hypoxia: Secondary | ICD-10-CM | POA: Diagnosis not present

## 2019-12-28 DIAGNOSIS — Z87891 Personal history of nicotine dependence: Secondary | ICD-10-CM | POA: Diagnosis not present

## 2019-12-28 DIAGNOSIS — R2681 Unsteadiness on feet: Secondary | ICD-10-CM | POA: Diagnosis present

## 2019-12-28 DIAGNOSIS — Z888 Allergy status to other drugs, medicaments and biological substances status: Secondary | ICD-10-CM | POA: Diagnosis not present

## 2019-12-28 DIAGNOSIS — M6281 Muscle weakness (generalized): Secondary | ICD-10-CM | POA: Diagnosis present

## 2019-12-28 DIAGNOSIS — R296 Repeated falls: Secondary | ICD-10-CM | POA: Diagnosis present

## 2019-12-28 DIAGNOSIS — E785 Hyperlipidemia, unspecified: Secondary | ICD-10-CM | POA: Diagnosis present

## 2019-12-28 DIAGNOSIS — Z823 Family history of stroke: Secondary | ICD-10-CM | POA: Diagnosis not present

## 2019-12-28 DIAGNOSIS — R739 Hyperglycemia, unspecified: Secondary | ICD-10-CM | POA: Diagnosis not present

## 2019-12-28 DIAGNOSIS — K449 Diaphragmatic hernia without obstruction or gangrene: Secondary | ICD-10-CM | POA: Diagnosis not present

## 2019-12-28 DIAGNOSIS — F411 Generalized anxiety disorder: Secondary | ICD-10-CM | POA: Diagnosis present

## 2019-12-28 DIAGNOSIS — M1711 Unilateral primary osteoarthritis, right knee: Secondary | ICD-10-CM | POA: Diagnosis not present

## 2019-12-28 DIAGNOSIS — I4891 Unspecified atrial fibrillation: Secondary | ICD-10-CM | POA: Diagnosis not present

## 2019-12-28 DIAGNOSIS — R531 Weakness: Secondary | ICD-10-CM | POA: Diagnosis not present

## 2019-12-28 DIAGNOSIS — R2689 Other abnormalities of gait and mobility: Secondary | ICD-10-CM | POA: Diagnosis present

## 2019-12-28 DIAGNOSIS — Z8673 Personal history of transient ischemic attack (TIA), and cerebral infarction without residual deficits: Secondary | ICD-10-CM | POA: Diagnosis not present

## 2019-12-28 DIAGNOSIS — Z79899 Other long term (current) drug therapy: Secondary | ICD-10-CM | POA: Diagnosis not present

## 2019-12-28 DIAGNOSIS — Z886 Allergy status to analgesic agent status: Secondary | ICD-10-CM | POA: Diagnosis not present

## 2019-12-28 DIAGNOSIS — R0602 Shortness of breath: Secondary | ICD-10-CM | POA: Diagnosis not present

## 2019-12-28 DIAGNOSIS — I739 Peripheral vascular disease, unspecified: Secondary | ICD-10-CM | POA: Diagnosis present

## 2019-12-28 DIAGNOSIS — R410 Disorientation, unspecified: Secondary | ICD-10-CM | POA: Diagnosis not present

## 2019-12-28 DIAGNOSIS — Z6833 Body mass index (BMI) 33.0-33.9, adult: Secondary | ICD-10-CM | POA: Diagnosis not present

## 2019-12-28 DIAGNOSIS — Z20822 Contact with and (suspected) exposure to covid-19: Secondary | ICD-10-CM | POA: Diagnosis present

## 2019-12-28 DIAGNOSIS — K219 Gastro-esophageal reflux disease without esophagitis: Secondary | ICD-10-CM | POA: Diagnosis present

## 2019-12-28 DIAGNOSIS — R41841 Cognitive communication deficit: Secondary | ICD-10-CM | POA: Diagnosis present

## 2019-12-28 DIAGNOSIS — E861 Hypovolemia: Secondary | ICD-10-CM | POA: Diagnosis present

## 2019-12-28 DIAGNOSIS — I110 Hypertensive heart disease with heart failure: Secondary | ICD-10-CM | POA: Diagnosis present

## 2019-12-28 DIAGNOSIS — M25561 Pain in right knee: Secondary | ICD-10-CM | POA: Diagnosis present

## 2019-12-28 DIAGNOSIS — F29 Unspecified psychosis not due to a substance or known physiological condition: Secondary | ICD-10-CM | POA: Diagnosis not present

## 2019-12-28 DIAGNOSIS — I480 Paroxysmal atrial fibrillation: Secondary | ICD-10-CM | POA: Diagnosis not present

## 2019-12-28 DIAGNOSIS — Z7901 Long term (current) use of anticoagulants: Secondary | ICD-10-CM | POA: Diagnosis not present

## 2019-12-28 DIAGNOSIS — E876 Hypokalemia: Secondary | ICD-10-CM | POA: Diagnosis not present

## 2019-12-28 DIAGNOSIS — I5032 Chronic diastolic (congestive) heart failure: Secondary | ICD-10-CM | POA: Diagnosis not present

## 2019-12-28 DIAGNOSIS — Z880 Allergy status to penicillin: Secondary | ICD-10-CM | POA: Diagnosis not present

## 2019-12-28 DIAGNOSIS — Z7401 Bed confinement status: Secondary | ICD-10-CM | POA: Diagnosis not present

## 2019-12-28 LAB — URINE CULTURE

## 2019-12-28 LAB — TSH: TSH: 1.184 u[IU]/mL (ref 0.350–4.500)

## 2019-12-28 LAB — CBC
HCT: 43.7 % (ref 36.0–46.0)
Hemoglobin: 14.4 g/dL (ref 12.0–15.0)
MCH: 33.2 pg (ref 26.0–34.0)
MCHC: 33 g/dL (ref 30.0–36.0)
MCV: 100.7 fL — ABNORMAL HIGH (ref 80.0–100.0)
Platelets: 195 10*3/uL (ref 150–400)
RBC: 4.34 MIL/uL (ref 3.87–5.11)
RDW: 14.1 % (ref 11.5–15.5)
WBC: 6.6 10*3/uL (ref 4.0–10.5)
nRBC: 0 % (ref 0.0–0.2)

## 2019-12-28 LAB — BASIC METABOLIC PANEL
Anion gap: 8 (ref 5–15)
BUN: 21 mg/dL (ref 8–23)
CO2: 28 mmol/L (ref 22–32)
Calcium: 9.3 mg/dL (ref 8.9–10.3)
Chloride: 105 mmol/L (ref 98–111)
Creatinine, Ser: 0.86 mg/dL (ref 0.44–1.00)
GFR calc Af Amer: >60 mL/min (ref >60)
GFR calc non Af Amer: 60 mL/min — ABNORMAL LOW (ref >60)
Glucose, Bld: 93 mg/dL (ref 70–99)
Potassium: 3.5 mmol/L (ref 3.5–5.1)
Sodium: 141 mmol/L (ref 135–145)

## 2019-12-28 LAB — MAGNESIUM: Magnesium: 1.8 mg/dL (ref 1.7–2.4)

## 2019-12-28 LAB — PHOSPHORUS: Phosphorus: 3.3 mg/dL (ref 2.5–4.6)

## 2019-12-28 MED ORDER — SODIUM CHLORIDE 0.9 % IV SOLN: INTRAVENOUS | Status: AC

## 2019-12-28 MED ORDER — FUROSEMIDE 20 MG PO TABS
20.0000 mg | ORAL_TABLET | Freq: Every day | ORAL | Status: DC
  Administered 2019-12-29 – 2019-12-30 (×2): 20 mg via ORAL
  Filled 2019-12-28 (×3): qty 1

## 2019-12-28 MED ORDER — POTASSIUM CHLORIDE CRYS ER 20 MEQ PO TBCR
40.0000 meq | EXTENDED_RELEASE_TABLET | Freq: Once | ORAL | Status: AC
  Administered 2019-12-28: 40 meq via ORAL
  Filled 2019-12-28: qty 4

## 2019-12-28 MED ORDER — MAGNESIUM SULFATE 2 GM/50ML IV SOLN
2.0000 g | Freq: Once | INTRAVENOUS | Status: AC
  Administered 2019-12-28: 2 g via INTRAVENOUS
  Filled 2019-12-28: qty 50

## 2019-12-28 NOTE — Discharge Instructions (Signed)

## 2019-12-28 NOTE — Progress Notes (Signed)
PROGRESS NOTE    Jacqueline [name]  WPY:[id number] DOB: [birth year] DOA: 12/27/2019 PCP: Jacqueline Manes, MD  Brief Narrative:  HPI per Dr. Ileene [name] on 12/27/19 Jacqueline [name] is a 86 y.o. female with medical history significant for paroxysmal atrial fibrillation on Eliquis, chronic diastolic heart failure, hypertension, CVA, PAD and GI bleed who presents with concerns of worsening weakness.  Patient is a unreliable historian and family could not be reached for further history.  History obtained in its entirety through ED documentation.  Reportedly patient has [name] having increased weakness for the past several days.  She endorsed that about 2 to 3 weeks ago she had a fall and has [name] working with physical therapy and has right knee pain.  However unable to answer any other questions regarding new symptoms.  ED Course: She was afebrile, mildly tachypneic and was hypoxic down to 88% on room air requiring 3 L of O2 via nasal cannula.  However, chest x-ray is negative and is unsure whether patient has had any URI symptoms. CBC showed no leukocytosis.  She appears somewhat hemoconcentrated with hemoglobin of 15.2 and HCT of 46.  Potassium low at 2.9 without EKG changes.  CT head and cervical spine with no acute findings.  **Interim History  Seen continues to be extremely weak.  We will continue IV fluid hydration and hold her Lasix today.  Obtaining a knee x-ray given her right knee pain.  PT OT recommending skilled nursing facility so we will consult social work for assistance with placement  Assessment & Plan:   Active Problems:   PAF (paroxysmal atrial fibrillation) (HCC)   Hypokalemia   Chronic diastolic CHF (congestive heart failure) (HCC)   Benign essential HTN   Acute respiratory failure with hypoxia (HCC)   Generalized weakness   Weakness  Acute hypoxic respiratory failure -Unclear etiology but suspect could be due to deconditioning.  - Pt afebrile with negative Chest x-ray without any  leukocytosis to suggest infection.  -No cardiac findings from unchanged EKG and she appeared slightly hypovolemic on exam.   -Incentive spirometry/flutter valve -SpO2: 97 % O2 Flow Rate (L/min): 2 L/min -Wean as tolerated and was on Room Air today   Generalized Weakness/ Physical Deconditioning/ Hypovolemic in the setting of ? Overdiuresis  -PT OT evaluation; Recommending SNF -Had good Rt knee ROM however will still X-Ray it as she fell on it a few weeks ago; Gets Cortisone injections  -Continue to Hold Xanax for now and may need to resume to abate withdrawals- pt noted to get it q8hrs at nursing facility -Given one 500cc bolus NS; Her Furosemide was restarted today but will discontinue it and change to 20 mg po Daily  -C/w IVF Hydration with NS at 75 mL/hr for 12 hours  Hypokalemia -Patient's potassium on admission was 2.9 -Replete with p.o. and IV KCl and improved to 3.5 today; will replete again with p.o. KCl today given that she had burning symptoms from administration through the IV  -Mag was 1.8 so will give 2 grams to ensure to get it >2.0 -Continue to monitor and replete as necessary -Repeat CMP in a.m.  Paroxysmal Atrial Fibrillation -Rate controlled -Continue Apixaban 5 mg po BID, Diltiazem 24h Capsule 360 mg po Daily, and Metoprolol Succinate 50 mg po daily  -Not on Telemetry   Chronic Diastolic Heart Failure -Slightly dry on exam -Will stop po Lasix and reduce dose to 20 mg po daily tomorrow -C/w Metoprolol Succinate 50 mg po qHS  Hypertension -Stable -C/w Diltiazem  360 mg po Daily and Metoprolol Succinate 50 mg po Daily -Last BP was 153/78  Right Knee Pain -Obtaining X-Ray  GERD -C/w PPI with Pantoprazole 40 mg po Every Morning   Obesity -Estimated body mass index is 33.32 kg/m as calculated from the following:   Height as of this encounter: 5\' 1"  (1.549 m).   Weight as of this encounter: 80 kg. -Weight Loss and Dietary Counseling   DVT prophylaxis:  Anticoagulated with Apixaban Code Status: FULL CODE Family Communication: Discussed with Jacqueline [name] at Bedside Disposition Plan: SNF  Status is: Inpatient  Remains inpatient appropriate because:Unsafe d/c plan, IV treatments appropriate due to intensity of illness or inability to take PO and Inpatient level of care appropriate due to severity of illness   Dispo: The patient is from: Home              Anticipated d/c is to: SNF              Anticipated d/c date is: 2 days              Patient currently is not medically stable to d/c.  Consultants:   None   Procedures:  None  Antimicrobials:  Anti-infectives (From admission, onward)   None     Subjective: Seen and examined at bedside and she is complaining of still being significantly weak.  Complaining of right knee pain and wanting an x-ray.  Note lightheadedness or dizziness.  Did not want IV potassium given that it burns significantly yesterday.  No other concerns or complaints at this time but was happy that her laboratory numbers were improving.  Objective: Vitals:   12/27/19 2340 12/28/19 0347 12/28/19 0800 12/28/19 1151  BP: (!) 153/68 140/74 138/78 (!) 153/78  Pulse: 66 63 66 79  Resp: 18 17 18 20   Temp: (!) 97.5 F (36.4 C) 98 F (36.7 C) 97.6 F (36.4 C) (!) 97.4 F (36.3 C)  TempSrc: Oral  Oral Oral  SpO2: 99% 98%  97%  Weight:      Height:        Intake/Output Summary (Last 24 hours) at 12/28/2019 1501 Last data filed at 12/28/2019 0700 Gross per 24 hour  Intake 388.96 ml  Output --  Net 388.96 ml   Filed Weights   12/27/19 1340  Weight: 80 kg   Examination: Physical Exam:  Constitutional: WN/WD elderly obese Caucasian female currently in NAD and appears anxious and did appear slightly uncomfortable Eyes: Lids and conjunctivae normal, sclerae anicteric  ENMT: External Ears, Nose appear normal. Grossly normal hearing.  Neck: Appears normal, supple, no cervical masses, normal ROM, no appreciable  thyromegaly; no JVD Respiratory: Diminished to auscultation bilaterally with unlabored breathing, no wheezing, rales, rhonchi or crackles. Normal respiratory effort and patient is not tachypenic. No accessory muscle use.  Not wearing any supplemental oxygen via nasal cannula Cardiovascular: RRR, no murmurs / rubs / gallops. S1 and S2 auscultated. No extremity edema appreciated Abdomen: Soft, non-tender, Distended 2/2 body habitus. Bowel sounds positive.  GU: Deferred. Musculoskeletal: No clubbing / cyanosis of digits/nails. No joint deformity upper and lower extremities.  Skin: No rashes, lesions, ulcers on a limited skin evaluation. No induration; Warm and dry.  Neurologic: CN 2-12 grossly intact with no focal deficits. Romberg sign and cerebellar reflexes not assessed.  Psychiatric: Normal judgment and insight. Alert and oriented x 3. Anxious mood and appropriate affect.   Data Reviewed: I have personally reviewed following labs and imaging studies  CBC: Recent Labs  Lab 12/27/19 1400 12/28/19 0148  WBC 6.6 6.6  NEUTROABS 4.8  --   HGB 15.2* 14.4  HCT 46.2* 43.7  MCV 100.4* 100.7*  PLT 229 144   Basic Metabolic Panel: Recent Labs  Lab 12/27/19 1400 12/28/19 0148 12/28/19 1150  NA 142 141  --   K 2.9* 3.5  --   CL 103 105  --   CO2 26 28  --   GLUCOSE 115* 93  --   BUN 25* 21  --   CREATININE 0.95 0.86  --   CALCIUM 9.4 9.3  --   MG 1.8  --  1.8  PHOS  --   --  3.3   GFR: Estimated Creatinine Clearance: 42.5 mL/min (by C-G formula based on SCr of 0.86 mg/dL). Liver Function Tests: Recent Labs  Lab 12/27/19 1400  AST 21  ALT 17  ALKPHOS 76  BILITOT 1.0  PROT 6.4*  ALBUMIN 3.6   No results for input(s): LIPASE, AMYLASE in the last 168 hours. No results for input(s): AMMONIA in the last 168 hours. Coagulation Profile: No results for input(s): INR, PROTIME in the last 168 hours. Cardiac Enzymes: No results for input(s): CKTOTAL, CKMB, CKMBINDEX, TROPONINI in  the last 168 hours. BNP (last 3 results) No results for input(s): PROBNP in the last 8760 hours. HbA1C: No results for input(s): HGBA1C in the last 72 hours. CBG: No results for input(s): GLUCAP in the last 168 hours. Lipid Profile: No results for input(s): CHOL, HDL, LDLCALC, TRIG, CHOLHDL, LDLDIRECT in the last 72 hours. Thyroid Function Tests: Recent Labs    12/28/19 1248  TSH 1.184   Anemia Panel: No results for input(s): VITAMINB12, FOLATE, FERRITIN, TIBC, IRON, RETICCTPCT in the last 72 hours. Sepsis Labs: No results for input(s): PROCALCITON, LATICACIDVEN in the last 168 hours.  Recent Results (from the past 240 hour(s))  Urine culture     Status: Abnormal   Collection Time: 12/27/19  2:00 PM   Specimen: Urine, Random  Result Value Ref Range Status   Specimen Description URINE, RANDOM  Final   Special Requests   Final    NONE Performed at [facility], [address], [place], [place] [address]    Culture MULTIPLE SPECIES PRESENT, SUGGEST RECOLLECTION (A)  Final   Report Status 12/28/2019 FINAL  Final  SARS Coronavirus 2 by RT PCR (hospital order, performed in [facility] hospital lab) Nasopharyngeal Nasopharyngeal Swab     Status: None   Collection Time: 12/27/19  2:09 PM   Specimen: Nasopharyngeal Swab  Result Value Ref Range Status   SARS Coronavirus 2 NEGATIVE NEGATIVE Final    Comment: (NOTE) SARS-CoV-2 target nucleic acids are NOT DETECTED.  The SARS-CoV-2 RNA is generally detectable in upper and lower respiratory specimens during the acute phase of infection. The lowest concentration of SARS-CoV-2 viral copies this assay can detect is 250 copies / mL. A negative result does not preclude SARS-CoV-2 infection and should not be used as the sole basis for treatment or other patient management decisions.  A negative result may occur with improper specimen collection / handling, submission of specimen other than nasopharyngeal swab, presence of viral  mutation(s) within the areas targeted by this assay, and inadequate number of viral copies (<250 copies / mL). A negative result must be combined with clinical observations, patient history, and epidemiological information.  Fact Sheet for Patients:   StrictlyIdeas.no  Fact Sheet for Healthcare Providers: BankingDealers.co.za  This test is not yet approved or  cleared by the [place] and has [name] authorized for detection and/or diagnosis of SARS-CoV-2 by FDA under an Emergency Use Authorization (EUA).  This EUA will remain in effect (meaning this test can be used) for the duration of the COVID-19 declaration under Section 564(b)(1) of the Act, 21 U.S.C. section 360bbb-3(b)(1), unless the authorization is terminated or revoked sooner.  Performed at [facility], [place] [address] [place], [place] [address]     RN Pressure Injury Documentation:     Estimated body mass index is 33.32 kg/m as calculated from the following:   Height as of this encounter: 5\' 1"  (1.549 m).   Weight as of this encounter: 80 kg.  Malnutrition Type:      Malnutrition Characteristics:      Nutrition Interventions:    Radiology Studies: DG Knee 1-2 Views Right  Result Date: 12/28/2019 CLINICAL DATA:  Knee pain. EXAM: RIGHT KNEE - 1-2 VIEW COMPARISON:  None. FINDINGS: No fracture or joint effusion. Tricompartmental degenerative changes most prominent the lateral compartment with significant loss of joint space. No other acute abnormalities. IMPRESSION: Significant degenerative changes.  No acute abnormality. Electronically Signed   By: Dorise Bullion III M.D   On: 12/28/2019 13:10   CT Head Wo Contrast  Result Date: 12/27/2019 CLINICAL DATA:  Weakness for several days EXAM: CT HEAD WITHOUT CONTRAST CT CERVICAL SPINE WITHOUT CONTRAST TECHNIQUE: Multidetector CT imaging of the head and cervical spine was performed following the standard  protocol without intravenous contrast. Multiplanar CT image reconstructions of the cervical spine were also generated. COMPARISON:  08/05/2019 FINDINGS: CT HEAD FINDINGS Brain: No evidence of acute infarction, hemorrhage, hydrocephalus, extra-axial collection or mass lesion/mass effect. Chronic atrophic and ischemic changes are noted. Vascular: No hyperdense vessel or unexpected calcification. Small amounts of air are noted within basilar venous structures consistent with recent IV initiation Skull: Normal. Negative for fracture or focal lesion. Sinuses/Orbits: Mild mucosal retention cysts are noted within the sphenoid sinus. Other: None. CT CERVICAL SPINE FINDINGS Alignment: Mild straightening of the normal cervical lordosis is noted. Mild degenerative anterolisthesis of C4 on C5 and C7 on T1 is seen. Skull base and vertebrae: 7 cervical segments are well visualized. Multilevel facet hypertrophic changes are seen. Multilevel disc space narrowing with osteophytic changes are seen worst at C5-6 and C6-7. The odontoid is within normal limits. Soft tissues and spinal canal: Surrounding soft tissue structures are within normal limits. Upper chest: Visualized lung apices are unremarkable. Other: None IMPRESSION: CT of the head: Chronic atrophic and ischemic changes without acute abnormality. CT of the cervical spine: Multilevel degenerative changes without acute abnormality. Electronically Signed   By: Inez Catalina M.D.   On: 12/27/2019 18:02   CT Cervical Spine Wo Contrast  Result Date: 12/27/2019 CLINICAL DATA:  Weakness for several days EXAM: CT HEAD WITHOUT CONTRAST CT CERVICAL SPINE WITHOUT CONTRAST TECHNIQUE: Multidetector CT imaging of the head and cervical spine was performed following the standard protocol without intravenous contrast. Multiplanar CT image reconstructions of the cervical spine were also generated. COMPARISON:  08/05/2019 FINDINGS: CT HEAD FINDINGS Brain: No evidence of acute infarction,  hemorrhage, hydrocephalus, extra-axial collection or mass lesion/mass effect. Chronic atrophic and ischemic changes are noted. Vascular: No hyperdense vessel or unexpected calcification. Small amounts of air are noted within basilar venous structures consistent with recent IV initiation Skull: Normal. Negative for fracture or focal lesion. Sinuses/Orbits: Mild mucosal retention cysts are noted within the sphenoid sinus. Other: None. CT CERVICAL SPINE FINDINGS Alignment: Mild  straightening of the normal cervical lordosis is noted. Mild degenerative anterolisthesis of C4 on C5 and C7 on T1 is seen. Skull base and vertebrae: 7 cervical segments are well visualized. Multilevel facet hypertrophic changes are seen. Multilevel disc space narrowing with osteophytic changes are seen worst at C5-6 and C6-7. The odontoid is within normal limits. Soft tissues and spinal canal: Surrounding soft tissue structures are within normal limits. Upper chest: Visualized lung apices are unremarkable. Other: None IMPRESSION: CT of the head: Chronic atrophic and ischemic changes without acute abnormality. CT of the cervical spine: Multilevel degenerative changes without acute abnormality. Electronically Signed   By: Inez Catalina M.D.   On: 12/27/2019 18:02   DG Chest Portable 1 View  Result Date: 12/27/2019 CLINICAL DATA:  Shortness of breath EXAM: PORTABLE CHEST 1 VIEW COMPARISON:  August 05, 2019 FINDINGS: There is no edema or airspace opacity. Heart is mildly enlarged with pulmonary vascularity normal. There is a large hiatal type hernia. There is aortic atherosclerosis. There is advanced arthropathy in the shoulders. There is erosion in the right humeral head consistent with chronic avascular necrosis in this area. IMPRESSION: No edema or airspace opacity. Stable cardiac silhouette. Large hiatal type hernia evident. 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  Aortic Atherosclerosis (ICD10-I70.0). Electronically Signed   By: Lowella Grip III M.D.   On: 12/27/2019 14:26   Scheduled Meds: . apixaban  5 mg Oral BID  . diltiazem  360 mg Oral Daily  . docusate sodium  200 mg Oral Daily  . [START ON 12/29/2019] furosemide  20 mg Oral Daily  . iron polysaccharides  150 mg Oral Daily  . metoprolol succinate  50 mg Oral q AM  . pantoprazole  40 mg Oral q AM   Continuous Infusions: . sodium chloride Stopped (12/28/19 0611)  . sodium chloride 75 mL/hr at 12/28/19 1330    LOS: 0 days   Kerney Elbe, DO [facility] PAGER is on AMION  If 7PM-7AM, please contact night-coverage [email or web address]

## 2019-12-28 NOTE — Evaluation (Signed)
Physical Therapy Evaluation Patient Details Name: Jacqueline [name] MRN: [id number] DOB: 1928/06/20 Today's Date: 12/28/2019   History of Present Illness  86 y.o. female with medical history significant for paroxysmal atrial fibrillation on Eliquis, chronic diastolic heart failure, hypertension, CVA, PAD and GI bleed who presents with concerns of worsening weakness    Clinical Impression  Pt agreeable to get up for orthostatic vitals with RW. Lying 156/95 (77), sitting 151/82 (80), standing 124/83 (87), and after 3 min 139/103 (91) with pt complaining of knees and back hurting and needing to sit down.   Pt with poor activity tolerance - she doesn't want exercise or therapy.  Pt needed mod assist x 2 with standing and unable to walk -  anticipate she will do better in her home setting but unsure how much assist she will need.  I would recommend +2 for minimal mobility and not sure this is available where she is.  Pt is limited by her arthritis and pain in her knees and back.  At this point - recommends SNF level care for more assist.  PT will continue following pt in the hospital    Follow Up Recommendations SNF;Supervision/Assistance - 24 hour    Equipment Recommendations  None recommended by PT    Recommendations for Other Services       Precautions / Restrictions Precautions Precautions: Fall Restrictions Weight Bearing Restrictions: No      Mobility  Bed Mobility Overal bed mobility: Needs Assistance Bed Mobility: Supine to Sit;Sit to Supine     Supine to sit: Max assist;HOB elevated Sit to supine: Max assist   General bed mobility comments: pt agreed to OOB to test standing BP.  She needed  help sitting up but able to scoot out.  Pt needed help getting her legs back in the bed when laying down  Transfers Overall transfer level: Needs assistance Equipment used: Rolling walker (2 wheeled) Transfers: Sit to/from Stand Sit to Stand: Mod assist;+2 physical assistance          General transfer comment: Pt stood EOB with RW to get orthostatic standing BP - she stood in flexed posture and posterior - leaning on bed.  Pt unabel to stand taller or wt shift with me.  pt sat back down and pt helped to scoot back and needed mod assist to scoot up in the bed.  Ambulation/Gait             General Gait Details: unable  Stairs            Wheelchair Mobility    Modified Rankin (Stroke Patients Only)       Balance Overall balance assessment: Needs assistance;History of Falls   Sitting balance-Leahy Scale: Fair       Standing balance-Leahy Scale: Zero                               Pertinent Vitals/Pain Pain Assessment: 0-10 Faces Pain Scale: Hurts whole lot Pain Location: back; knees Pain Descriptors / Indicators: Aching;Grimacing;Guarding;Moaning Pain Intervention(s): Repositioned;Monitored during session;Limited activity within patient's tolerance    Home Living Family/patient expects to be discharged to:: Assisted living               Home Equipment: Walker - 4 wheels;Wheelchair - manual Additional Comments: per son, she owns a knee brace with velcro straps that helps her knee "some"    Prior Function Level of Independence: Needs assistance   Gait /  Transfers Assistance Needed: used a rollator for mobility and staff assisted wtih walking to the bathroom; used wc for longer distances; reports she could dress herself after set up; staff assists with showers           Hand Dominance        Extremity/Trunk Assessment   Upper Extremity Assessment Upper Extremity Assessment: Defer to OT evaluation    Lower Extremity Assessment Lower Extremity Assessment: RLE deficits/detail;LLE deficits/detail RLE Deficits / Details: Right leg with genu valgus - pt stands on it with approx 30 degee angle.  Sitting quads - 30 degrees from full active extension LLE Deficits / Details: Left leg with genu valgus - 15 degrees.  Sitting  quads - 30 degrees from full active extension    Cervical / Trunk Assessment Cervical / Trunk Assessment: Kyphotic  Communication   Communication: HOH  Cognition Arousal/Alertness: Awake/alert Behavior During Therapy: Anxious Overall Cognitive Status: History of cognitive impairments - at baseline                                 General Comments: Pt reports she doesnt like exercise and never has. she doesnt want to do any activity and save her energy to get home tomorrow.  Pt moves only when she has to.      General Comments General comments (skin integrity, edema, etc.): CNA in the room - helped to get orthostatic vitals.  Pts 3 min standing is high - pts legs were hurting and she was ready to sit down.    Exercises     Assessment/Plan    PT Assessment Patient needs continued PT services  PT Problem List Decreased strength;Decreased mobility;Decreased safety awareness;Decreased activity tolerance;Decreased balance;Pain       PT Treatment Interventions Therapeutic activities;Gait training;Therapeutic exercise;Patient/family education;Functional mobility training    PT Goals (Current goals can be found in the Care Plan section)  Acute Rehab PT Goals Patient Stated Goal: to get better PT Goal Formulation: With patient Time For Goal Achievement: 01/10/20 Potential to Achieve Goals: Fair    Frequency Min 3X/week   Barriers to discharge   Pt lives in ALF - unsure if they can provide the amount of care pt needs    Co-evaluation               AM-PAC PT "6 Clicks" Mobility  Outcome Measure Help needed turning from your back to your side while in a flat bed without using bedrails?: A Lot Help needed moving from lying on your back to sitting on the side of a flat bed without using bedrails?: A Lot Help needed moving to and from a bed to a chair (including a wheelchair)?: A Lot Help needed standing up from a chair using your arms (e.g., wheelchair or bedside  chair)?: A Lot Help needed to walk in hospital room?: Total Help needed climbing 3-5 steps with a railing? : Total 6 Click Score: 10    End of Session Equipment Utilized During Treatment: Gait belt Activity Tolerance: Patient limited by pain Patient left: in bed;with bed alarm set;with call bell/phone within reach Nurse Communication: Mobility status PT Visit Diagnosis: Other abnormalities of gait and mobility (R26.89);Muscle weakness (generalized) (M62.81);Difficulty in walking, not elsewhere classified (R26.2);Pain Pain - part of body: Knee    Time: 1525-1549 PT Time Calculation (min) (ACUTE ONLY): 24 min   Charges:   PT Evaluation $PT Eval Moderate Complexity: 1 Mod PT Treatments $  Therapeutic Activity: 8-22 mins        12/28/2019   Rande Lawman, PT   Loyal Buba 12/28/2019, 4:48 PM

## 2019-12-28 NOTE — Progress Notes (Signed)
Occupational Therapy Evaluation Patient Details Name: Jacqueline [name] MRN: [id number] DOB: 1928/06/12 Today's Date: 12/28/2019    History of Present Illness 86 y.o. female with medical history significant for paroxysmal atrial fibrillation on Eliquis, chronic diastolic heart failure, hypertension, CVA, PAD and GI bleed who presents with concerns of worsening weakness   Clinical Impression   Pt was living at [facility] and had minimal assistance from staff to walk to the bathroom using her rollator. She used a wc for further distances. Pt was modified independent with dressing and required min A with bathing. Pt presents with significant functional decline requiring Max A with stand pivot transfer and unable to ambulate. Max A with LB ADL and total A with toileiting. Session completed on RA with SpO2 89-95; DOE 1/4. Recommend rehab at SNF. Will follow acutely.     Follow Up Recommendations  SNF;Supervision/Assistance - 24 hour    Equipment Recommendations  None recommended by OT    Recommendations for Other Services PT consult     Precautions / Restrictions Precautions Precautions: Fall Restrictions Weight Bearing Restrictions: No      Mobility Bed Mobility Overal bed mobility: Needs Assistance Bed Mobility: Supine to Sit     Supine to sit: Max assist        Transfers Overall transfer level: Needs assistance Equipment used: Rolling walker (2 wheeled) (unable to complete with RW) Transfers: Stand Pivot Transfers;Sit to/from Stand Sit to Stand: Mod assist Stand pivot transfers: Max assist            Balance Overall balance assessment: Needs assistance;History of Falls   Sitting balance-Leahy Scale: Poor       Standing balance-Leahy Scale: Zero                             ADL either performed or assessed with clinical judgement   ADL Overall ADL's : Needs assistance/impaired Eating/Feeding: Set up   Grooming: Minimal assistance;Sitting    Upper Body Bathing: Minimal assistance;Sitting   Lower Body Bathing: Maximal assistance;Sit to/from stand   Upper Body Dressing : Moderate assistance;Sitting   Lower Body Dressing: Maximal assistance;Sit to/from stand   Toilet Transfer: Maximal assistance;Stand-pivot (simulated)   Toileting- Clothing Manipulation and Hygiene: Total assistance       Functional mobility during ADLs: Maximal assistance;+2 for safety/equipment       Vision Baseline Vision/History: Wears glasses       Perception     Praxis      Pertinent Vitals/Pain Pain Assessment: Faces Faces Pain Scale: Hurts even more Pain Location: back; knees Pain Descriptors / Indicators: Aching;Grimacing;Guarding;Moaning Pain Intervention(s): Limited activity within patient's tolerance     Hand Dominance Right   Extremity/Trunk Assessment Upper Extremity Assessment Upper Extremity Assessment: Generalized weakness (B shoulder AROM limitations to @ 30 degrees FF at baseline)   Lower Extremity Assessment Lower Extremity Assessment: Defer to PT evaluation   Cervical / Trunk Assessment Cervical / Trunk Assessment: Kyphotic;Other exceptions (forward head)   Communication Communication Communication: HOH   Cognition Arousal/Alertness: Awake/alert Behavior During Therapy: Anxious Overall Cognitive Status: History of cognitive impairments - at baseline Area of Impairment: Orientation;Attention;Memory;Following commands;Safety/judgement;Awareness;Problem solving                 Orientation Level: Disoriented to;Time Current Attention Level: Selective Memory: Decreased recall of precautions;Decreased short-term memory Following Commands: Follows one step commands with increased time Safety/Judgement: Decreased awareness of safety;Decreased awareness of deficits Awareness: Emergent Problem Solving: Slow  processing;Decreased initiation General Comments: tangential   General Comments  son "Flake" in room  for eval; educated on DC rec    Exercises Exercises: Other exercises Other Exercises Other Exercises: Marching sitting EOB   Shoulder Instructions      Home Living Family/patient expects to be discharged to:: Assisted living                                        Prior Functioning/Environment Level of Independence: Needs assistance  Gait / Transfers Assistance Needed: used a rollator for mobility and staff assisted wtih walking to the bathroom; used wc for longer distances; reports she could dress herself after set up; staff assists with showers ADL's / Homemaking Assistance Needed: see above Communication / Swallowing Assistance Needed: HOH          OT Problem List: Decreased strength;Decreased activity tolerance;Decreased range of motion;Impaired balance (sitting and/or standing);Decreased coordination;Decreased cognition;Decreased safety awareness;Decreased knowledge of use of DME or AE;Cardiopulmonary status limiting activity;Obesity;Impaired UE functional use;Pain      OT Treatment/Interventions: Self-care/ADL training;Therapeutic exercise;Energy conservation;DME and/or AE instruction;Therapeutic activities;Cognitive remediation/compensation;Patient/family education;Balance training    OT Goals(Current goals can be found in the care plan section) Acute Rehab OT Goals Patient Stated Goal: to get better OT Goal Formulation: With patient Time For Goal Achievement: 01/11/20 Potential to Achieve Goals: Good  OT Frequency: Min 2X/week   Barriers to D/C:            Co-evaluation              AM-PAC OT "6 Clicks" Daily Activity     Outcome Measure Help from another person eating meals?: A Little Help from another person taking care of personal grooming?: A Little Help from another person toileting, which includes using toliet, bedpan, or urinal?: Total Help from another person bathing (including washing, rinsing, drying)?: A Lot Help from another  person to put on and taking off regular upper body clothing?: A Lot Help from another person to put on and taking off regular lower body clothing?: A Lot 6 Click Score: 13   End of Session Equipment Utilized During Treatment: Gait belt Nurse Communication: Mobility status;Need for lift equipment  Activity Tolerance: Patient tolerated treatment well Patient left: in chair;with call bell/phone within reach;with chair alarm set  OT Visit Diagnosis: Unsteadiness on feet (R26.81);Other abnormalities of gait and mobility (R26.89);Muscle weakness (generalized) (M62.81);History of falling (Z91.81);Other symptoms and signs involving cognitive function;Pain Pain - part of body: Knee (B; back)                Time: 6761-9509 OT Time Calculation (min): 44 min Charges:  OT General Charges $OT Visit: 1 Visit OT Evaluation $OT Eval Moderate Complexity: 1 Mod OT Treatments $Self Care/Home Management : 23-37 mins  Maurie Boettcher, OT/L   Acute OT Clinical Specialist Acute Rehabilitation Services Pager [phone] Office [phone]   [facility] 12/28/2019, 9:51 AM

## 2019-12-28 NOTE — Social Work (Signed)
CSW did not see any family bedside. CSW contacted patient's son [name] to discuss discharge plan and left a voicemail. Awaiting a callback.  Darra Lis, [place] Work

## 2019-12-29 ENCOUNTER — Inpatient Hospital Stay (HOSPITAL_COMMUNITY): Payer: Medicare Other

## 2019-12-29 LAB — COMPREHENSIVE METABOLIC PANEL
ALT: 15 U/L (ref 0–44)
AST: 18 U/L (ref 15–41)
Albumin: 3.3 g/dL — ABNORMAL LOW (ref 3.5–5.0)
Alkaline Phosphatase: 70 U/L (ref 38–126)
Anion gap: 5 (ref 5–15)
BUN: 13 mg/dL (ref 8–23)
CO2: 28 mmol/L (ref 22–32)
Calcium: 9.2 mg/dL (ref 8.9–10.3)
Chloride: 105 mmol/L (ref 98–111)
Creatinine, Ser: 0.74 mg/dL (ref 0.44–1.00)
GFR calc Af Amer: >60 mL/min (ref >60)
GFR calc non Af Amer: >60 mL/min (ref >60)
Glucose, Bld: 101 mg/dL — ABNORMAL HIGH (ref 70–99)
Potassium: 3.7 mmol/L (ref 3.5–5.1)
Sodium: 138 mmol/L (ref 135–145)
Total Bilirubin: 0.8 mg/dL (ref 0.3–1.2)
Total Protein: 6.1 g/dL — ABNORMAL LOW (ref 6.5–8.1)

## 2019-12-29 LAB — CBC WITH DIFFERENTIAL/PLATELET
Abs Immature Granulocytes: 0.02 10*3/uL (ref 0.00–0.07)
Basophils Absolute: 0 10*3/uL (ref 0.0–0.1)
Basophils Relative: 1 %
Eosinophils Absolute: 0.2 10*3/uL (ref 0.0–0.5)
Eosinophils Relative: 3 %
HCT: 42.5 % (ref 36.0–46.0)
Hemoglobin: 13.9 g/dL (ref 12.0–15.0)
Immature Granulocytes: 0 %
Lymphocytes Relative: 20 %
Lymphs Abs: 1.3 10*3/uL (ref 0.7–4.0)
MCH: 32.6 pg (ref 26.0–34.0)
MCHC: 32.7 g/dL (ref 30.0–36.0)
MCV: 99.8 fL (ref 80.0–100.0)
Monocytes Absolute: 0.6 10*3/uL (ref 0.1–1.0)
Monocytes Relative: 9 %
Neutro Abs: 4.2 10*3/uL (ref 1.7–7.7)
Neutrophils Relative %: 67 %
Platelets: 200 10*3/uL (ref 150–400)
RBC: 4.26 MIL/uL (ref 3.87–5.11)
RDW: 14.2 % (ref 11.5–15.5)
WBC: 6.3 10*3/uL (ref 4.0–10.5)
nRBC: 0 % (ref 0.0–0.2)

## 2019-12-29 LAB — MAGNESIUM: Magnesium: 2.3 mg/dL (ref 1.7–2.4)

## 2019-12-29 LAB — PHOSPHORUS: Phosphorus: 2.8 mg/dL (ref 2.5–4.6)

## 2019-12-29 MED ORDER — ALPRAZOLAM 0.5 MG PO TABS: 0.5000 mg | ORAL_TABLET | ORAL | Status: DC

## 2019-12-29 MED ORDER — ALPRAZOLAM 0.5 MG PO TABS
0.5000 mg | ORAL_TABLET | Freq: Three times a day (TID) | ORAL | Status: DC | PRN
  Administered 2019-12-29 – 2019-12-30 (×4): 0.5 mg via ORAL
  Filled 2019-12-29 (×4): qty 1

## 2019-12-29 MED ORDER — POTASSIUM CHLORIDE CRYS ER 20 MEQ PO TBCR
40.0000 meq | EXTENDED_RELEASE_TABLET | Freq: Once | ORAL | Status: AC
  Administered 2019-12-29: 40 meq via ORAL
  Filled 2019-12-29: qty 2

## 2019-12-29 NOTE — [place] FL2 (Signed)
[place] MEDICAID FL2 LEVEL OF CARE SCREENING TOOL     IDENTIFICATION  Patient Name: Jacqueline [name] Birthdate: 1929/02/15 Sex: female Admission Date (Current Location): 12/27/2019  [facility] and [place] Number:  Herbalist and Address:  [facility], [place] [address] [place], [place] [address]      Provider Number: [id number]  Attending Physician Name and Address:  Kerney Elbe, DO  Relative Name and Phone Number:  Maurianna Benard    Current Level of Care: Hospital Recommended Level of Care: [place] Prior Approval Number:    Date Approved/Denied:   PASRR Number: [id number]  Discharge Plan: SNF    Current Diagnoses: Patient Active Problem List   Diagnosis Date Noted  . Weakness 12/27/2019  . Generalized weakness 08/05/2019  . Macrocytosis 08/05/2019  . Acute on chronic kidney failure ([place]) 08/05/2019  . Acute respiratory failure with hypoxia ([place]) 12/24/2018  . Acute on chronic diastolic CHF (congestive heart failure) ([place]) 12/24/2018  . Dyspnea 12/24/2018  . Chronic knee pain 12/24/2018  . History of multiple cerebrovascular accidents (CVAs) 12/24/2018  . GERD (gastroesophageal reflux disease) 11/04/2017  . Altered mental status 11/03/2017  . CVA (cerebral vascular accident) ([place]) 10/25/2017  . Benign essential HTN   . History of TIA (transient ischemic attack)   . TIA (transient ischemic attack) 10/24/2017  . Cerebral infarction due to embolism of right middle cerebral artery ([place])   . HLD (hyperlipidemia)   . Acute ischemic right MCA stroke ([place]) 06/18/2014  . Facial droop   . Displaced fracture of shaft of left humerus 04/03/2014  . Right arm fracture 04/01/2014  . Displaced spiral fracture of shaft of humerus   . Peripheral arterial disease ([place]) 10/03/2013  . Chronic diastolic CHF (congestive heart failure) ([place]) 08/08/2013  . Chronic venous insufficiency 04/02/2013  . UTI (urinary tract infection)  01/13/2013  . Hypokalemia 01/13/2013  . PAF (paroxysmal atrial fibrillation) ([place]) 10/03/2012  . CAD (coronary artery disease) of artery bypass graft 04/05/2012  . Atrial fibrillation with RVR ([place]) 03/13/2012  . Need for prophylactic vaccination and inoculation against influenza 02/14/2012  . ABDOMINAL WALL HERNIA 01/21/2010  . PARESTHESIA 05/04/2009  . SKIN LESION 01/28/2009  . Nocturia 01/28/2009  . DYSPNEA 01/01/2009  . CVA (cerebral infarction) 11/17/2008  . ALLERGY 10/27/2008  . PAROXYSMAL NOCTURNAL DYSPNEA 10/03/2008  . Osteoarthrosis, unspecified whether generalized or localized, involving lower leg 09/03/2008  . RESTRICTIVE LUNG DISEASE 05/26/2008  . Symptomatic anemia 01/08/2008  . UNS ADVRS EFF OTH RX MEDICINAL&BIOLOGICAL SBSTNC 11/09/2007  . HEMATOCHEZIA 07/18/2007  . PARANOIA 06/01/2007  . Anxiety 01/23/2007  . Hypertensive heart disease with CHF ([place]) 01/23/2007  . GI BLEEDING 01/23/2007  . Osteoarthritis 01/23/2007  . DEGENERATIVE DISC DISEASE, LUMBAR SPINE 01/23/2007  . History of peptic ulcer disease 01/23/2007  . Personal history of other diseases of digestive system 01/23/2007    Orientation RESPIRATION BLADDER Height & Weight     Self, Situation, Place, Time  Normal Incontinent, External catheter Weight: 176 lb 5.9 oz (80 kg) Height:  5\' 1"  (154.9 cm)  BEHAVIORAL SYMPTOMS/MOOD NEUROLOGICAL BOWEL NUTRITION STATUS      Continent Diet (See discharge summary)  AMBULATORY STATUS COMMUNICATION OF NEEDS Skin   Limited Assist Verbally Normal                       Personal Care Assistance Level of Assistance  Bathing, Feeding, Dressing Bathing Assistance: Limited assistance Feeding assistance: Independent Dressing Assistance:  Limited assistance     Functional Limitations Info  Sight, Hearing, Speech Sight Info: Adequate Hearing Info: Adequate Speech Info: Adequate    SPECIAL CARE FACTORS FREQUENCY  PT (By licensed PT), OT (By licensed OT)     PT  Frequency: 5x a week OT Frequency: 5x a week            Contractures Contractures Info: Not present    Additional Factors Info  Code Status, Allergies Code Status Info: FULL Allergies Info: Metoclopramide Hcl, Penicillins, Aspirin, Zetia (Ezetimibe)           Current Medications (12/29/2019):  This is the current hospital active medication list Current Facility-Administered Medications  Medication Dose Route Frequency Provider Last Rate Last Admin  . 0.9 %  sodium chloride infusion   Intravenous PRN Ileene Musa T, DO   Stopped at 12/28/19 2761  . acetaminophen (TYLENOL) tablet 500 mg  500 mg Oral Q8H PRN Tu, Ching T, DO   500 mg at 12/29/19 4709  . ALPRAZolam Duanne Moron) tablet 0.5 mg  0.5 mg Oral TID PRN Raiford Noble Latif, DO   0.5 mg at 12/29/19 2957  . apixaban (ELIQUIS) tablet 5 mg  5 mg Oral BID Tu, Ching T, DO   5 mg at 12/29/19 1035  . diltiazem (CARDIZEM CD) 24 hr capsule 360 mg  360 mg Oral Daily Tu, Ching T, DO   360 mg at 12/29/19 0930  . docusate sodium (COLACE) capsule 200 mg  200 mg Oral Daily Tu, Ching T, DO   200 mg at 12/29/19 0929  . furosemide (LASIX) tablet 20 mg  20 mg Oral Daily Raiford Noble Latif, DO   20 mg at 12/29/19 0930  . iron polysaccharides (NIFEREX) capsule 150 mg  150 mg Oral Daily Tu, Ching T, DO   150 mg at 12/29/19 4734  . metoprolol succinate (TOPROL-XL) 24 hr tablet 50 mg  50 mg Oral q AM Tu, Ching T, DO   50 mg at 12/29/19 0654  . pantoprazole (PROTONIX) EC tablet 40 mg  40 mg Oral q AM Tu, Ching T, DO   40 mg at 12/29/19 0370     Discharge Medications: Please see discharge summary for a list of discharge medications.  Relevant Imaging Results:  Relevant Lab Results:   Additional Information SSN [id number]  Waylan Boga, [place]

## 2019-12-29 NOTE — TOC Initial Note (Signed)
Transition of Care [facility]) - Initial/Assessment Note    Patient Details  Name: Jacqueline [name] MRN: [id number] Date of Birth: 08-24-1928  [facility] CM/SW Contact:    Varney Baas Phone Number: 12/29/2019, 1:58 PM  Clinical Narrative:                 CSW met with patient bedside. Patient provided permission to contact her son [name]. CSW contacted patient's son on speaker phone bedside and discussed PT recommendation for SNF placement. Patient and son are both in agreement. Patient is fully vaccinated.  Expected Discharge Plan: Skilled Nursing Facility Barriers to Discharge: SNF Pending bed offer   Patient Goals and CMS Choice   CMS Medicare.gov Compare Post Acute Care list provided to:: Patient Represenative (must comment) Sherian Maroon) Choice offered to / list presented to : Adult Children  Expected Discharge Plan and Services Expected Discharge Plan: [place] In-house Referral: Clinical Social Work     Living arrangements for the past 2 months: [place]                                      Prior Living Arrangements/Services Living arrangements for the past 2 months: [place] Lives with:: Facility Resident Patient language and need for interpreter reviewed:: Yes Do you feel safe going back to the place where you live?: Yes      Need for Family Participation in Patient Care: Yes (Comment) Care giver support system in place?: Yes (comment)   Criminal Activity/Legal Involvement Pertinent to Current Situation/Hospitalization: No - Comment as needed  Activities of Daily Living      Permission Sought/Granted Permission sought to share information with : Facility Sport and exercise psychologist, Family Supports Permission granted to share information with : Yes, Verbal Permission Granted  Share Information with NAME: Chole Driver  Permission granted to share info w AGENCY: SNFs  Permission granted to share  info w Relationship: Son  Permission granted to share info w Contact Information: [phone]  Emotional Assessment   Attitude/Demeanor/Rapport: Engaged Affect (typically observed): Pleasant Orientation: : Oriented to Self, Oriented to Place, Oriented to  Time, Oriented to Situation Alcohol / Substance Use: Not Applicable Psych Involvement: No (comment)  Admission diagnosis:  Hypokalemia [E87.6] Weakness [R53.1] Generalized weakness [R53.1] Patient Active Problem List   Diagnosis Date Noted  . Weakness 12/27/2019  . Generalized weakness 08/05/2019  . Macrocytosis 08/05/2019  . Acute on chronic kidney failure ([place]) 08/05/2019  . Acute respiratory failure with hypoxia ([place]) 12/24/2018  . Acute on chronic diastolic CHF (congestive heart failure) ([place]) 12/24/2018  . Dyspnea 12/24/2018  . Chronic knee pain 12/24/2018  . History of multiple cerebrovascular accidents (CVAs) 12/24/2018  . GERD (gastroesophageal reflux disease) 11/04/2017  . Altered mental status 11/03/2017  . CVA (cerebral vascular accident) ([place]) 10/25/2017  . Benign essential HTN   . History of TIA (transient ischemic attack)   . TIA (transient ischemic attack) 10/24/2017  . Cerebral infarction due to embolism of right middle cerebral artery ([place])   . HLD (hyperlipidemia)   . Acute ischemic right MCA stroke ([place]) 06/18/2014  . Facial droop   . Displaced fracture of shaft of left humerus 04/03/2014  . Right arm fracture 04/01/2014  . Displaced spiral fracture of shaft of humerus   . Peripheral arterial disease ([place]) 10/03/2013  . Chronic diastolic CHF (congestive heart failure) ([place]) 08/08/2013  . Chronic  venous insufficiency 04/02/2013  . UTI (urinary tract infection) 01/13/2013  . Hypokalemia 01/13/2013  . PAF (paroxysmal atrial fibrillation) ([place]) 10/03/2012  . CAD (coronary artery disease) of artery bypass graft 04/05/2012  . Atrial fibrillation with RVR ([place]) 03/13/2012  . Need for prophylactic  vaccination and inoculation against influenza 02/14/2012  . ABDOMINAL WALL HERNIA 01/21/2010  . PARESTHESIA 05/04/2009  . SKIN LESION 01/28/2009  . Nocturia 01/28/2009  . DYSPNEA 01/01/2009  . CVA (cerebral infarction) 11/17/2008  . ALLERGY 10/27/2008  . PAROXYSMAL NOCTURNAL DYSPNEA 10/03/2008  . Osteoarthrosis, unspecified whether generalized or localized, involving lower leg 09/03/2008  . RESTRICTIVE LUNG DISEASE 05/26/2008  . Symptomatic anemia 01/08/2008  . UNS ADVRS EFF OTH RX MEDICINAL&BIOLOGICAL SBSTNC 11/09/2007  . HEMATOCHEZIA 07/18/2007  . PARANOIA 06/01/2007  . Anxiety 01/23/2007  . Hypertensive heart disease with CHF ([place]) 01/23/2007  . GI BLEEDING 01/23/2007  . Osteoarthritis 01/23/2007  . DEGENERATIVE DISC DISEASE, LUMBAR SPINE 01/23/2007  . History of peptic ulcer disease 01/23/2007  . Personal history of other diseases of digestive system 01/23/2007   PCP:  Lajean Manes, MD Pharmacy:   [facility] [address] [place]. [place] [address] Phone: [phone] Fax: [phone]     Social Determinants of Health (SDOH) Interventions    Readmission Risk Interventions No flowsheet data found.

## 2019-12-29 NOTE — Progress Notes (Signed)
PROGRESS NOTE    Jacqueline [name]  JYN:[id number] DOB: 04-19-1929 DOA: 12/27/2019 PCP: Lajean Manes, MD  Brief Narrative:  HPI per Dr. Ileene Musa on 12/27/19 Jacqueline [name] is a 86 y.o. female with medical history significant for paroxysmal atrial fibrillation on Eliquis, chronic diastolic heart failure, hypertension, CVA, PAD and GI bleed who presents with concerns of worsening weakness.  Patient is a unreliable historian and family could not be reached for further history.  History obtained in its entirety through ED documentation.  Reportedly patient has been having increased weakness for the past several days.  She endorsed that about 2 to 3 weeks ago she had a fall and has been working with physical therapy and has right knee pain.  However unable to answer any other questions regarding new symptoms.  ED Course: She was afebrile, mildly tachypneic and was hypoxic down to 88% on room air requiring 3 L of O2 via nasal cannula.  However, chest x-ray is negative and is unsure whether patient has had any URI symptoms. CBC showed no leukocytosis.  She appears somewhat hemoconcentrated with hemoglobin of 15.2 and HCT of 46.  Potassium low at 2.9 without EKG changes.  CT head and cervical spine with no acute findings.  **Interim History  She continues to be extremely weak.  IVF given yesterday now stopped and Lasix resumed at 20 mg po Daily.  PT OT recommending skilled nursing facility so we will consult social work for assistance with placement. Currently she is medically stable to be D/C'd.   Assessment & Plan:   Active Problems:   PAF (paroxysmal atrial fibrillation) (HCC)   Hypokalemia   Chronic diastolic CHF (congestive heart failure) (HCC)   Benign essential HTN   Acute respiratory failure with hypoxia (HCC)   Generalized weakness   Weakness  Acute hypoxic respiratory failure, improved  -Unclear etiology but suspect could be due to deconditioning.  - Pt afebrile with negative Chest x-ray  without any leukocytosis to suggest infection.  -No cardiac findings from unchanged EKG and she appeared slightly hypovolemic on exam.   -Incentive spirometry/flutter valve -SpO2: 95 % O2 Flow Rate (L/min): 2 L/min -Wean as tolerated and was on Room Air today  -Unsure if she will be able to an Ambulatory Home O2 screen Prior to D/C given her 2+ Assist but currently off of O2 and saturating well without it   Generalized Weakness/ Physical Deconditioning/ Hypovolemic in the setting of ? Overdiuresis, improved  -PT OT evaluation; Recommending SNF -Had good Rt knee ROM however will still X-Ray it as she fell on it a few weeks ago; Gets Cortisone injections  -Initially held Xanax but will resume it TIDprn -Given one 500cc bolus NS; Her Furosemide was restarted yesterday but will discontinue it and change to 20 mg po Daily starting today  -IVF Hydration with NS at 75 mL/hr for 12 hours now stopped  -She is doing well today   Hypokalemia -Patient's potassium on admission was 2.9 -Replete with p.o. and IV KCl and improved to 3.7 today; will replete again with p.o. KCl today given that she had burning symptoms from administration through the IV  -Mag was 1.8 so will give 2 grams to ensure to get it >2.0 and it is now 2.3 today  -Continue to monitor and replete as necessary -Repeat CMP in a.m.  Paroxysmal Atrial Fibrillation -Rate controlled -Continue Apixaban 5 mg po BID, Diltiazem 24h Capsule 360 mg po Daily, and Metoprolol Succinate 50 mg po daily  -TSH was  1.184 -Not on Telemetry   Chronic Diastolic Heart Failure -Slightly dry on exam -Adjusted po Lasix and reduced dose to 20 mg po daily starting today  -C/w Metoprolol Succinate 50 mg po qHS  Hypertension -Stable -C/w Diltiazem 360 mg po Daily and Metoprolol Succinate 50 mg po Daily -Last BP was 153/78  Right Knee Pain -Obtained X-Ray and showed "No fracture or joint effusion. Tricompartmental degenerative changes most  prominent the lateral compartment with significant loss of joint space. No other acute abnormalities."  GERD -C/w PPI with Pantoprazole 40 mg po Every Morning   Anxiety -Resume Alprazolam 0.5 mg TIDprn  Hyperglycemia -Mild at 101 on Blood Sugar this AM on CMP -Continue to Monitor and Trend -If Necessary will place on Sensitive Novolog SSI AC  Obesity -Estimated body mass index is 33.32 kg/m as calculated from the following:   Height as of this encounter: 5\' 1"  (1.549 m).   Weight as of this encounter: 80 kg. -Weight Loss and Dietary Counseling   DVT prophylaxis: Anticoagulated with Apixaban Code Status: FULL CODE Family Communication: No family present at bedside currently  Disposition Plan: SNF when bed is available   Status is: Inpatient  Remains inpatient appropriate because:Unsafe d/c plan, IV treatments appropriate due to intensity of illness or inability to take PO and Inpatient level of care appropriate due to severity of illness   Dispo: The patient is from: Home              Anticipated d/c is to: SNF              Anticipated d/c date is: 1 day              Patient currently is medically stable to d/c.  Consultants:   None   Procedures:  None  Antimicrobials:  Anti-infectives (From admission, onward)   None     Subjective: Seen and examined at bedside and she was doing well. No complaints but wanting to go back to her facility. Thinks she is getting a little stronger. No CP or SOB. No other concerns or complaints at this time.   Objective: Vitals:   12/28/19 2000 12/28/19 2344 12/29/19 0337 12/29/19 0809  BP: (!) 146/94 (!) 150/82 (!) 148/70 (!) 144/73  Pulse: 80 75 76 71  Resp: 18 18 18 16   Temp: 97.6 F (36.4 C) 98.5 F (36.9 C) 98.3 F (36.8 C) 98.1 F (36.7 C)  TempSrc: Oral Oral Oral Oral  SpO2: 95% 95% 94% 95%  Weight:      Height:        Intake/Output Summary (Last 24 hours) at 12/29/2019 0981 Last data filed at 12/29/2019 0400 Gross  per 24 hour  Intake 240 ml  Output 1300 ml  Net -1060 ml   Filed Weights   12/27/19 1340  Weight: 80 kg   Examination: Physical Exam:  Constitutional: WN/WD elderly obese Caucasian female in NAD and appears calmed and comfortable Eyes: Lids and conjunctivae normal, sclerae anicteric  ENMT: External Ears, Nose appear normal. Grossly normal hearing. Neck: Appears normal, supple, no cervical masses, normal ROM, no appreciable thyromegaly; no JVD Respiratory: Diminished to auscultation bilaterally with unlabored breathing, no wheezing, rales, rhonchi or crackles. Normal respiratory effort and patient is not tachypenic. No accessory muscle use. Not wearing supplemental O2 via [place].  Cardiovascular: RRR, no murmurs / rubs / gallops. S1 and S2 auscultated. No extremity edema.  Abdomen: Soft, non-tender, Distended 2/2 to body habitus. Bowel sounds positive.  GU: Deferred.  Musculoskeletal: No clubbing / cyanosis of digits/nails. No joint deformity upper and lower extremities. Skin: No rashes, lesions, ulcers but has some mild bruising on extremities. No induration; Warm and dry.  Neurologic: CN 2-12 grossly intact with no focal deficits. Romberg sign and cerebellar reflexes not assessed.  Psychiatric: Normal judgment and insight. Alert and oriented x 3. Normal mood and appropriate affect.   Data Reviewed: I have personally reviewed following labs and imaging studies  CBC: Recent Labs  Lab 12/27/19 1400 12/28/19 0148 12/29/19 0059  WBC 6.6 6.6 6.3  NEUTROABS 4.8  --  4.2  HGB 15.2* 14.4 13.9  HCT 46.2* 43.7 42.5  MCV 100.4* 100.7* 99.8  PLT 229 195 062   Basic Metabolic Panel: Recent Labs  Lab 12/27/19 1400 12/28/19 0148 12/28/19 1150 12/29/19 0059  NA 142 141  --  138  K 2.9* 3.5  --  3.7  CL 103 105  --  105  CO2 26 28  --  28  GLUCOSE 115* 93  --  101*  BUN 25* 21  --  13  CREATININE 0.95 0.86  --  0.74  CALCIUM 9.4 9.3  --  9.2  MG 1.8  --  1.8 2.3  PHOS  --   --  3.3  2.8   GFR: Estimated Creatinine Clearance: 45.7 mL/min (by C-G formula based on SCr of 0.74 mg/dL). Liver Function Tests: Recent Labs  Lab 12/27/19 1400 12/29/19 0059  AST 21 18  ALT 17 15  ALKPHOS 76 70  BILITOT 1.0 0.8  PROT 6.4* 6.1*  ALBUMIN 3.6 3.3*   No results for input(s): LIPASE, AMYLASE in the last 168 hours. No results for input(s): AMMONIA in the last 168 hours. Coagulation Profile: No results for input(s): INR, PROTIME in the last 168 hours. Cardiac Enzymes: No results for input(s): CKTOTAL, CKMB, CKMBINDEX, TROPONINI in the last 168 hours. BNP (last 3 results) No results for input(s): PROBNP in the last 8760 hours. HbA1C: No results for input(s): HGBA1C in the last 72 hours. CBG: No results for input(s): GLUCAP in the last 168 hours. Lipid Profile: No results for input(s): CHOL, HDL, LDLCALC, TRIG, CHOLHDL, LDLDIRECT in the last 72 hours. Thyroid Function Tests: Recent Labs    12/28/19 1248  TSH 1.184   Anemia Panel: No results for input(s): VITAMINB12, FOLATE, FERRITIN, TIBC, IRON, RETICCTPCT in the last 72 hours. Sepsis Labs: No results for input(s): PROCALCITON, LATICACIDVEN in the last 168 hours.  Recent Results (from the past 240 hour(s))  Urine culture     Status: Abnormal   Collection Time: 12/27/19  2:00 PM   Specimen: Urine, Random  Result Value Ref Range Status   Specimen Description URINE, RANDOM  Final   Special Requests   Final    NONE Performed at [facility], [address] [place], [place] [address]    Culture MULTIPLE SPECIES PRESENT, SUGGEST RECOLLECTION (A)  Final   Report Status 12/28/2019 FINAL  Final  SARS Coronavirus 2 by RT PCR (hospital order, performed in [facility] hospital lab) Nasopharyngeal Nasopharyngeal Swab     Status: None   Collection Time: 12/27/19  2:09 PM   Specimen: Nasopharyngeal Swab  Result Value Ref Range Status   SARS Coronavirus 2 NEGATIVE NEGATIVE Final    Comment: (NOTE) SARS-CoV-2 target  nucleic acids are NOT DETECTED.  The SARS-CoV-2 RNA is generally detectable in upper and lower respiratory specimens during the acute phase of infection. The lowest concentration of SARS-CoV-2 viral copies  this assay can detect is 250 copies / mL. A negative result does not preclude SARS-CoV-2 infection and should not be used as the sole basis for treatment or other patient management decisions.  A negative result may occur with improper specimen collection / handling, submission of specimen other than nasopharyngeal swab, presence of viral mutation(s) within the areas targeted by this assay, and inadequate number of viral copies (<250 copies / mL). A negative result must be combined with clinical observations, patient history, and epidemiological information.  Fact Sheet for Patients:   StrictlyIdeas.no  Fact Sheet for Healthcare Providers: BankingDealers.co.za  This test is not yet approved or  cleared by the [place] FDA and has been authorized for detection and/or diagnosis of SARS-CoV-2 by FDA under an Emergency Use Authorization (EUA).  This EUA will remain in effect (meaning this test can be used) for the duration of the COVID-19 declaration under Section 564(b)(1) of the Act, 21 U.S.C. section 360bbb-3(b)(1), unless the authorization is terminated or revoked sooner.  Performed at [facility], [place] [address], [place], [place] [address]     RN Pressure Injury Documentation:     Estimated body mass index is 33.32 kg/m as calculated from the following:   Height as of this encounter: 5\' 1"  (1.549 m).   Weight as of this encounter: 80 kg.  Malnutrition Type:      Malnutrition Characteristics:      Nutrition Interventions:    Radiology Studies: DG Knee 1-2 Views Right  Result Date: 12/28/2019 CLINICAL DATA:  Knee pain. EXAM: RIGHT KNEE - 1-2 VIEW COMPARISON:  None. FINDINGS: No fracture or joint  effusion. Tricompartmental degenerative changes most prominent the lateral compartment with significant loss of joint space. No other acute abnormalities. IMPRESSION: Significant degenerative changes.  No acute abnormality. Electronically Signed   By: Dorise Bullion III M.D   On: 12/28/2019 13:10   CT Head Wo Contrast  Result Date: 12/27/2019 CLINICAL DATA:  Weakness for several days EXAM: CT HEAD WITHOUT CONTRAST CT CERVICAL SPINE WITHOUT CONTRAST TECHNIQUE: Multidetector CT imaging of the head and cervical spine was performed following the standard protocol without intravenous contrast. Multiplanar CT image reconstructions of the cervical spine were also generated. COMPARISON:  08/05/2019 FINDINGS: CT HEAD FINDINGS Brain: No evidence of acute infarction, hemorrhage, hydrocephalus, extra-axial collection or mass lesion/mass effect. Chronic atrophic and ischemic changes are noted. Vascular: No hyperdense vessel or unexpected calcification. Small amounts of air are noted within basilar venous structures consistent with recent IV initiation Skull: Normal. Negative for fracture or focal lesion. Sinuses/Orbits: Mild mucosal retention cysts are noted within the sphenoid sinus. Other: None. CT CERVICAL SPINE FINDINGS Alignment: Mild straightening of the normal cervical lordosis is noted. Mild degenerative anterolisthesis of C4 on C5 and C7 on T1 is seen. Skull base and vertebrae: 7 cervical segments are well visualized. Multilevel facet hypertrophic changes are seen. Multilevel disc space narrowing with osteophytic changes are seen worst at C5-6 and C6-7. The odontoid is within normal limits. Soft tissues and spinal canal: Surrounding soft tissue structures are within normal limits. Upper chest: Visualized lung apices are unremarkable. Other: None IMPRESSION: CT of the head: Chronic atrophic and ischemic changes without acute abnormality. CT of the cervical spine: Multilevel degenerative changes without acute  abnormality. Electronically Signed   By: Inez Catalina M.D.   On: 12/27/2019 18:02   CT Cervical Spine Wo Contrast  Result Date: 12/27/2019 CLINICAL DATA:  Weakness for several days EXAM: CT HEAD WITHOUT CONTRAST CT  CERVICAL SPINE WITHOUT CONTRAST TECHNIQUE: Multidetector CT imaging of the head and cervical spine was performed following the standard protocol without intravenous contrast. Multiplanar CT image reconstructions of the cervical spine were also generated. COMPARISON:  08/05/2019 FINDINGS: CT HEAD FINDINGS Brain: No evidence of acute infarction, hemorrhage, hydrocephalus, extra-axial collection or mass lesion/mass effect. Chronic atrophic and ischemic changes are noted. Vascular: No hyperdense vessel or unexpected calcification. Small amounts of air are noted within basilar venous structures consistent with recent IV initiation Skull: Normal. Negative for fracture or focal lesion. Sinuses/Orbits: Mild mucosal retention cysts are noted within the sphenoid sinus. Other: None. CT CERVICAL SPINE FINDINGS Alignment: Mild straightening of the normal cervical lordosis is noted. Mild degenerative anterolisthesis of C4 on C5 and C7 on T1 is seen. Skull base and vertebrae: 7 cervical segments are well visualized. Multilevel facet hypertrophic changes are seen. Multilevel disc space narrowing with osteophytic changes are seen worst at C5-6 and C6-7. The odontoid is within normal limits. Soft tissues and spinal canal: Surrounding soft tissue structures are within normal limits. Upper chest: Visualized lung apices are unremarkable. Other: None IMPRESSION: CT of the head: Chronic atrophic and ischemic changes without acute abnormality. CT of the cervical spine: Multilevel degenerative changes without acute abnormality. Electronically Signed   By: Inez Catalina M.D.   On: 12/27/2019 18:02   DG Chest Portable 1 View  Result Date: 12/27/2019 CLINICAL DATA:  Shortness of breath EXAM: PORTABLE CHEST 1 VIEW COMPARISON:   August 05, 2019 FINDINGS: There is no edema or airspace opacity. Heart is mildly enlarged with pulmonary vascularity normal. There is a large hiatal type hernia. There is aortic atherosclerosis. There is advanced arthropathy in the shoulders. There is erosion in the right humeral head consistent with chronic avascular necrosis in this area. IMPRESSION: No edema or airspace opacity. Stable cardiac silhouette. Large hiatal type hernia evident. 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 Aortic Atherosclerosis (ICD10-I70.0). Electronically Signed   By: Lowella Grip III M.D.   On: 12/27/2019 14:26   Scheduled Meds: . apixaban  5 mg Oral BID  . diltiazem  360 mg Oral Daily  . docusate sodium  200 mg Oral Daily  . furosemide  20 mg Oral Daily  . iron polysaccharides  150 mg Oral Daily  . metoprolol succinate  50 mg Oral q AM  . pantoprazole  40 mg Oral q AM  . potassium chloride  40 mEq Oral Once   Continuous Infusions: . sodium chloride Stopped (12/28/19 0611)    LOS: 1 day   Kerney Elbe, DO [facility] PAGER is on [place]  If 7PM-7AM, please contact night-coverage [email or web address]

## 2019-12-30 DIAGNOSIS — M25561 Pain in right knee: Secondary | ICD-10-CM | POA: Diagnosis not present

## 2019-12-30 DIAGNOSIS — F29 Unspecified psychosis not due to a substance or known physiological condition: Secondary | ICD-10-CM | POA: Diagnosis not present

## 2019-12-30 DIAGNOSIS — M2550 Pain in unspecified joint: Secondary | ICD-10-CM | POA: Diagnosis not present

## 2019-12-30 DIAGNOSIS — K219 Gastro-esophageal reflux disease without esophagitis: Secondary | ICD-10-CM | POA: Diagnosis not present

## 2019-12-30 DIAGNOSIS — I4891 Unspecified atrial fibrillation: Secondary | ICD-10-CM | POA: Diagnosis not present

## 2019-12-30 DIAGNOSIS — R296 Repeated falls: Secondary | ICD-10-CM | POA: Diagnosis present

## 2019-12-30 DIAGNOSIS — J9601 Acute respiratory failure with hypoxia: Secondary | ICD-10-CM | POA: Diagnosis not present

## 2019-12-30 DIAGNOSIS — F4320 Adjustment disorder, unspecified: Secondary | ICD-10-CM | POA: Diagnosis not present

## 2019-12-30 DIAGNOSIS — I10 Essential (primary) hypertension: Secondary | ICD-10-CM | POA: Diagnosis present

## 2019-12-30 DIAGNOSIS — F419 Anxiety disorder, unspecified: Secondary | ICD-10-CM | POA: Diagnosis not present

## 2019-12-30 DIAGNOSIS — R531 Weakness: Secondary | ICD-10-CM | POA: Diagnosis not present

## 2019-12-30 DIAGNOSIS — R410 Disorientation, unspecified: Secondary | ICD-10-CM | POA: Diagnosis not present

## 2019-12-30 DIAGNOSIS — I480 Paroxysmal atrial fibrillation: Secondary | ICD-10-CM | POA: Diagnosis present

## 2019-12-30 DIAGNOSIS — E876 Hypokalemia: Secondary | ICD-10-CM | POA: Diagnosis present

## 2019-12-30 DIAGNOSIS — F0281 Dementia in other diseases classified elsewhere with behavioral disturbance: Secondary | ICD-10-CM | POA: Diagnosis not present

## 2019-12-30 DIAGNOSIS — M6281 Muscle weakness (generalized): Secondary | ICD-10-CM | POA: Diagnosis present

## 2019-12-30 DIAGNOSIS — R2689 Other abnormalities of gait and mobility: Secondary | ICD-10-CM | POA: Diagnosis present

## 2019-12-30 DIAGNOSIS — R2681 Unsteadiness on feet: Secondary | ICD-10-CM | POA: Diagnosis present

## 2019-12-30 DIAGNOSIS — I5032 Chronic diastolic (congestive) heart failure: Secondary | ICD-10-CM | POA: Diagnosis present

## 2019-12-30 DIAGNOSIS — G3183 Dementia with Lewy bodies: Secondary | ICD-10-CM | POA: Diagnosis not present

## 2019-12-30 DIAGNOSIS — Z7401 Bed confinement status: Secondary | ICD-10-CM | POA: Diagnosis not present

## 2019-12-30 DIAGNOSIS — I639 Cerebral infarction, unspecified: Secondary | ICD-10-CM | POA: Diagnosis not present

## 2019-12-30 DIAGNOSIS — R41841 Cognitive communication deficit: Secondary | ICD-10-CM | POA: Diagnosis present

## 2019-12-30 LAB — SARS CORONAVIRUS 2 BY RT PCR (HOSPITAL ORDER, PERFORMED IN ~~LOC~~ HOSPITAL LAB): SARS Coronavirus 2: NEGATIVE

## 2019-12-30 MED ORDER — FUROSEMIDE 20 MG PO TABS: 20.0000 mg | ORAL_TABLET | Freq: Every day | ORAL | 0 refills | Status: AC

## 2019-12-30 MED ORDER — DICLOFENAC SODIUM 1 % EX GEL
2.0000 g | Freq: Four times a day (QID) | CUTANEOUS | Status: DC
  Filled 2019-12-30: qty 100

## 2019-12-30 MED ORDER — DICLOFENAC SODIUM 1 % EX GEL: 2.0000 g | Freq: Four times a day (QID) | CUTANEOUS | 0 refills | Status: AC

## 2019-12-30 MED ORDER — ALPRAZOLAM 0.5 MG PO TABS: 0.5000 mg | ORAL_TABLET | Freq: Three times a day (TID) | ORAL | 0 refills | Status: DC | PRN

## 2019-12-30 NOTE — Discharge Summary (Signed)
Physician Discharge Summary  Jacqueline [name] YTK:[id number] DOB: 01-Jun-1928 DOA: 12/27/2019  PCP: Lajean Manes, MD  Admit date: 12/27/2019 Discharge date: 12/30/2019  Admitted From: Home (ALF) Disposition: SNF  Recommendations for Outpatient Follow-up:  1. Follow up with PCP in 1-2 weeks 2. Repeat CXR in 3-6 weeks  3. Please obtain CMP/CBC. Mag, Phos in one week 4. Please follow up on the following pending results:  Home Health: No Equipment/Devices: None   Discharge Condition: Stable CODE STATUS: FULL CODE Diet recommendation: Heart Healthy Diet   Brief/Interim Summary: HPI per Dr. Ileene Musa on 12/27/19 Ezzard Standing a 86 y.o.femalewith medical history significant forparoxysmal atrial fibrillation on Eliquis, chronic diastolic heart failure, hypertension, CVA, PAD and GI bleed who presents with concerns of worsening weakness.  Patient is a unreliable historian and family could not be reached for further history. History obtained in its entirety through ED documentation. Reportedly patient has been having increased weakness for the past several days. She endorsed that about 2 to 3 weeks ago she had a fall and has been working with physical therapy and has right knee pain. However unable to answer any other questions regarding new symptoms.  ED Course:She was afebrile, mildly tachypneic and was hypoxic down to 88% on room air requiring 3 L of O2 via nasal cannula. However, chest x-ray is negative and is unsure whether patient has had any URI symptoms. CBC showed no leukocytosis.She appears somewhat hemoconcentrated with hemoglobin of 15.2 and HCT of 46. Potassium low at 2.9 without EKG changes. CT head and cervical spine with no acute findings.  **Interim History  She continues to be extremely weak.  IVF given yesterday now stopped and Lasix resumed at 20 mg po Daily.  PT OT recommending skilled nursing facility so we will consult social work for assistance with placement.  Currently she is medically stable to be D/C'd.   Discharge Diagnoses:  Active Problems:   PAF (paroxysmal atrial fibrillation) (HCC)   Hypokalemia   Chronic diastolic CHF (congestive heart failure) (HCC)   Benign essential HTN   Acute respiratory failure with hypoxia (HCC)   Generalized weakness   Weakness  Acute hypoxic respiratory failure, improved  -Unclear etiology but suspect could be due to deconditioning.  -Pt afebrile with negative Chest x-ray without any leukocytosis to suggest infection.  -No cardiac findings from unchanged EKG and she appeared slightly hypovolemic on exam.  -Incentive spirometry/flutter valve -SpO2: 92 % O2 Flow Rate (L/min): 2 L/min; Not wearing any Supplemental O2 today  -Wean as tolerated and was on Room Air today  -Unsure if she will be able to an Ambulatory Home O2 screen Prior to D/C given her 2+ Assist but currently off of O2 and saturating well without it  -Repeat CXR 12/29/19 showed "A rounded nodular density projects over the lateral right mid lung. This could represent focal atelectasis or developing infiltrate. A true nodule is less likely given the absence of this finding 2 days ago. Recommend short-term follow-up imaging to ensure resolution. 2. No other acute interval changes."  -Currently not on any Supplemental O2 and no clinical S/Sx of of Infection and no Dyspnea, Cough, or URI symptoms -Flutter Valve and Incentive Spirometry  -Repeat CXR in 3-6 weeks  Generalized Weakness/ Physical Deconditioning/ Hypovolemicin the setting of ? Overdiuresis, improved  -PT OT evaluation; Recommending SNF -Had good Rt knee ROM however will still X-Ray it as she fell on it a few weeks ago; Gets Cortisone injections  -Initially held Xanax but will resume it  TIDprn -Given one 500cc bolus NS; Her Furosemide was restarted yesterday but will discontinue it and change to 20 mg po Daily starting today  -IVF Hydration with NS at 75 mL/hr for 12 hours now  stopped  -She is doing well today and improved -C/w PT/OT at SNF  Hypokalemia -Patient's potassium on admission was 2.9 -Replete with p.o. and IV KCl and improved to 3.7 yesterday;  -Mag was 2.3 yesterday  -Continue to monitor and replete as necessary -Repeat CMP in a.m.  Paroxysmal Atrial Fibrillation -Rate controlled -Continue Apixaban 5 mg po BID, Diltiazem 24h Capsule 360 mg po Daily, and Metoprolol Succinate 50 mg po daily  -TSH was 1.184 -Not on Telemetry   Chronic Diastolic Heart Failure -Slightly dry on exam initially but now -Adjusted po Lasix and reduced dose to 20 mg po daily starting yesterday  -C/w Metoprolol Succinate 50 mg po qHS  Hypertension -Stable -C/w Diltiazem 360 mg po Daily and Metoprolol Succinate 50 mg po Daily -Last BP was 154/85  Right Knee Pain -Obtained X-Ray and showed "No fracture or joint effusion. Tricompartmental degenerative changes most prominent the lateral compartment with significant loss of joint space. No other acute abnormalities." -Added Voltaren gel -Follow up with Orthopedic Surgery for follow up and Cortisone injections   GERD -C/w PPI with Pantoprazole 40 mg po Every Morning   Anxiety -Resume Alprazolam 0.5 mg TIDprn  Hyperglycemia -Mild at 101 on Blood Sugar this AM on CMP yesterday  -Continue to Monitor and Trend -If Necessary will place on Sensitive Novolog SSI AC -Follow at SNF  Obesity -Estimated body mass index is 33.32 kg/m as calculated from the following:   Height as of this encounter: 5\' 1"  (1.549 m).   Weight as of this encounter: 80 kg. -Weight Loss and Dietary Counseling   Discharge Instructions  Discharge Instructions    (HEART FAILURE PATIENTS) Call MD:  Anytime you have any of the following symptoms: 1) 3 pound weight gain in 24 hours or 5 pounds in 1 week 2) shortness of breath, with or without a dry hacking cough 3) swelling in the hands, feet or stomach 4) if you have to sleep on extra  pillows at night in order to breathe.   Complete by: As directed    Call MD for:  difficulty breathing, headache or visual disturbances   Complete by: As directed    Call MD for:  extreme fatigue   Complete by: As directed    Call MD for:  hives   Complete by: As directed    Call MD for:  persistant dizziness or light-headedness   Complete by: As directed    Call MD for:  persistant nausea and vomiting   Complete by: As directed    Call MD for:  redness, tenderness, or signs of infection (pain, swelling, redness, odor or green/yellow discharge around incision site)   Complete by: As directed    Call MD for:  severe uncontrolled pain   Complete by: As directed    Call MD for:  temperature >100.4   Complete by: As directed    Diet - low sodium heart healthy   Complete by: As directed    Discharge instructions   Complete by: As directed    You were cared for by a hospitalist during your hospital stay. If you have any questions about your discharge medications or the care you received while you were in the hospital after you are discharged, you can call the unit and ask  to speak with the hospitalist on call if the hospitalist that took care of you is not available. Once you are discharged, your primary care physician will handle any further medical issues. Please note that NO REFILLS for any discharge medications will be authorized once you are discharged, as it is imperative that you return to your primary care physician (or establish a relationship with a primary care physician if you do not have one) for your aftercare needs so that they can reassess your need for medications and monitor your lab values.  Follow up with PCP and Orthopedic Surgery as an outpatient. Take all medications as prescribed. If symptoms change or worsen please return to the ED for evaluation   Increase activity slowly   Complete by: As directed      Allergies as of 12/30/2019      Reactions   Metoclopramide Hcl  Other (See Comments)    Tremors   Penicillins Itching, Other (See Comments)   "Allergic," per [facility] Has patient had a PCN reaction causing immediate rash, facial/tongue/throat swelling, SOB or lightheadedness with hypotension: Yes Has patient had a PCN reaction causing severe rash involving mucus membranes or skin necrosis: Yes Has patient had a PCN reaction that required hospitalization:No Has patient had a PCN reaction occurring within the last 10 years: No If all of the above answers are "NO", then may proceed with Cephalosporin use.   Aspirin Other (See Comments)   "gave me stomach aches; made me bleed" (03/13/12)- "Allergic," per [facility]   Zetia [ezetimibe] Other (See Comments)   Stomach and back pain      Medication List    STOP taking these medications   bisacodyl 10 MG suppository Commonly known as: DULCOLAX   diltiazem 180 MG 24 hr capsule Commonly known as: DILACOR XR   senna 8.6 MG Tabs tablet Commonly known as: SENOKOT   Systane Overnight Therapy 0.3 % Gel ophthalmic ointment Generic drug: hypromellose     TAKE these medications   acetaminophen 650 MG CR tablet Commonly known as: TYLENOL Take 1,300 mg by mouth 2 (two) times daily.   acetaminophen 500 MG tablet Commonly known as: TYLENOL Take 500 mg by mouth every 8 (eight) hours as needed (for pain).   ALPRAZolam 0.5 MG tablet Commonly known as: XANAX Take 1 tablet (0.5 mg total) by mouth 3 (three) times daily as needed for anxiety. What changed:   when to take this  reasons to take this  additional instructions  Another medication with the same name was removed. Continue taking this medication, and follow the directions you see here.   apixaban 5 MG Tabs tablet Commonly known as: ELIQUIS Take 1 tablet (5 mg total) by mouth 2 (two) times daily.   cholecalciferol 25 MCG (1000 UNIT) tablet Commonly known as: VITAMIN D3 Take 1,000 Units by mouth daily. What changed: Another medication with the same name  was removed. Continue taking this medication, and follow the directions you see here.   diclofenac Sodium 1 % Gel Commonly known as: VOLTAREN Apply 2 g topically 4 (four) times daily.   diltiazem 360 MG 24 hr capsule Commonly known as: CARDIZEM CD Take 360 mg by mouth daily.   docusate sodium 100 MG capsule Commonly known as: COLACE Take 200 mg by mouth daily.   furosemide 20 MG tablet Commonly known as: LASIX Take 1 tablet (20 mg total) by mouth daily. Start taking on: December 31, 2019 What changed:   medication strength  how much to take  iron polysaccharides 150 MG capsule Commonly known as: NIFEREX Take 1 capsule (150 mg total) by mouth daily.   metoprolol succinate 50 MG 24 hr tablet Commonly known as: TOPROL-XL Take 50 mg by mouth in the morning.   multivitamin with minerals Tabs tablet Take 1 tablet by mouth daily.   pantoprazole 40 MG tablet Commonly known as: PROTONIX TAKE 1 TABLET EVERY MORNING What changed:   how much to take  how to take this  when to take this  additional instructions   polyethylene glycol 17 g packet Commonly known as: MIRALAX / GLYCOLAX Take 17 g by mouth daily as needed for mild constipation (MIX AND DRINK).   Systane 0.4-0.3 % Soln Generic drug: Polyethyl Glycol-Propyl Glycol Place 2 drops into both eyes every 8 (eight) hours as needed (for dry eyes).       Allergies  Allergen Reactions  . Metoclopramide Hcl Other (See Comments)     Tremors  . Penicillins Itching and Other (See Comments)    "Allergic," per [facility] Has patient had a PCN reaction causing immediate rash, facial/tongue/throat swelling, SOB or lightheadedness with hypotension: Yes Has patient had a PCN reaction causing severe rash involving mucus membranes or skin necrosis: Yes Has patient had a PCN reaction that required hospitalization:No Has patient had a PCN reaction occurring within the last 10 years: No If all of the above answers are "NO", then may  proceed with Cephalosporin use.   . Aspirin Other (See Comments)    "gave me stomach aches; made me bleed" (03/13/12)- "Allergic," per [facility]  . Zetia [Ezetimibe] Other (See Comments)    Stomach and back pain    Consultations:  None  Procedures/Studies: DG Knee 1-2 Views Right  Result Date: 12/28/2019 CLINICAL DATA:  Knee pain. EXAM: RIGHT KNEE - 1-2 VIEW COMPARISON:  None. FINDINGS: No fracture or joint effusion. Tricompartmental degenerative changes most prominent the lateral compartment with significant loss of joint space. No other acute abnormalities. IMPRESSION: Significant degenerative changes.  No acute abnormality. Electronically Signed   By: Dorise Bullion III M.D   On: 12/28/2019 13:10   CT Head Wo Contrast  Result Date: 12/27/2019 CLINICAL DATA:  Weakness for several days EXAM: CT HEAD WITHOUT CONTRAST CT CERVICAL SPINE WITHOUT CONTRAST TECHNIQUE: Multidetector CT imaging of the head and cervical spine was performed following the standard protocol without intravenous contrast. Multiplanar CT image reconstructions of the cervical spine were also generated. COMPARISON:  08/05/2019 FINDINGS: CT HEAD FINDINGS Brain: No evidence of acute infarction, hemorrhage, hydrocephalus, extra-axial collection or mass lesion/mass effect. Chronic atrophic and ischemic changes are noted. Vascular: No hyperdense vessel or unexpected calcification. Small amounts of air are noted within basilar venous structures consistent with recent IV initiation Skull: Normal. Negative for fracture or focal lesion. Sinuses/Orbits: Mild mucosal retention cysts are noted within the sphenoid sinus. Other: None. CT CERVICAL SPINE FINDINGS Alignment: Mild straightening of the normal cervical lordosis is noted. Mild degenerative anterolisthesis of C4 on C5 and C7 on T1 is seen. Skull base and vertebrae: 7 cervical segments are well visualized. Multilevel facet hypertrophic changes are seen. Multilevel disc space narrowing with  osteophytic changes are seen worst at C5-6 and C6-7. The odontoid is within normal limits. Soft tissues and spinal canal: Surrounding soft tissue structures are within normal limits. Upper chest: Visualized lung apices are unremarkable. Other: None IMPRESSION: CT of the head: Chronic atrophic and ischemic changes without acute abnormality. CT of the cervical spine: Multilevel degenerative changes without acute abnormality. Electronically Signed  By: Inez Catalina M.D.   On: 12/27/2019 18:02   CT Cervical Spine Wo Contrast  Result Date: 12/27/2019 CLINICAL DATA:  Weakness for several days EXAM: CT HEAD WITHOUT CONTRAST CT CERVICAL SPINE WITHOUT CONTRAST TECHNIQUE: Multidetector CT imaging of the head and cervical spine was performed following the standard protocol without intravenous contrast. Multiplanar CT image reconstructions of the cervical spine were also generated. COMPARISON:  08/05/2019 FINDINGS: CT HEAD FINDINGS Brain: No evidence of acute infarction, hemorrhage, hydrocephalus, extra-axial collection or mass lesion/mass effect. Chronic atrophic and ischemic changes are noted. Vascular: No hyperdense vessel or unexpected calcification. Small amounts of air are noted within basilar venous structures consistent with recent IV initiation Skull: Normal. Negative for fracture or focal lesion. Sinuses/Orbits: Mild mucosal retention cysts are noted within the sphenoid sinus. Other: None. CT CERVICAL SPINE FINDINGS Alignment: Mild straightening of the normal cervical lordosis is noted. Mild degenerative anterolisthesis of C4 on C5 and C7 on T1 is seen. Skull base and vertebrae: 7 cervical segments are well visualized. Multilevel facet hypertrophic changes are seen. Multilevel disc space narrowing with osteophytic changes are seen worst at C5-6 and C6-7. The odontoid is within normal limits. Soft tissues and spinal canal: Surrounding soft tissue structures are within normal limits. Upper chest: Visualized lung  apices are unremarkable. Other: None IMPRESSION: CT of the head: Chronic atrophic and ischemic changes without acute abnormality. CT of the cervical spine: Multilevel degenerative changes without acute abnormality. Electronically Signed   By: Inez Catalina M.D.   On: 12/27/2019 18:02   DG CHEST PORT 1 VIEW  Result Date: 12/29/2019 CLINICAL DATA:  Shortness of breath EXAM: PORTABLE CHEST 1 VIEW COMPARISON:  December 27, 2019 FINDINGS: A large hiatal hernia remains. The cardiomediastinal silhouette is stable. No pneumothorax. A rounded nodular density projects over the lateral right lung, new in the interval. No other nodules. No masses. No other infiltrates. IMPRESSION: 1. A rounded nodular density projects over the lateral right mid lung. This could represent focal atelectasis or developing infiltrate. A true nodule is less likely given the absence of this finding 2 days ago. Recommend short-term follow-up imaging to ensure resolution. 2. No other acute interval changes. Electronically Signed   By: Dorise Bullion III M.D   On: 12/29/2019 10:32   DG Chest Portable 1 View  Result Date: 12/27/2019 CLINICAL DATA:  Shortness of breath EXAM: PORTABLE CHEST 1 VIEW COMPARISON:  August 05, 2019 FINDINGS: There is no edema or airspace opacity. Heart is mildly enlarged with pulmonary vascularity normal. There is a large hiatal type hernia. There is aortic atherosclerosis. There is advanced arthropathy in the shoulders. There is erosion in the right humeral head consistent with chronic avascular necrosis in this area. IMPRESSION: No edema or airspace opacity. Stable cardiac silhouette. Large hiatal type hernia evident. 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 Aortic Atherosclerosis (ICD10-I70.0). Electronically Signed   By: Lowella Grip III M.D.   On: 12/27/2019 14:26    Subjective: Seen and examined at bedside and she was doing fairly  well.  Complain of knee pain.  No nausea or vomiting.  Evaluate shortness breath or chest pain.  No other concerns or complaints at this time and will continue rehab at the skilled nursing facility.  Discharge Exam: Vitals:   12/30/19 0443 12/30/19 0739  BP: (!) 144/69 (!) 154/85  Pulse: 84 84  Resp: 17 16  Temp: 98.4 F (36.9 C) (!) 97.5 F (36.4  C)  SpO2: (!) 89% 92%   Vitals:   12/29/19 1930 12/29/19 2341 12/30/19 0443 12/30/19 0739  BP: (!) 165/77 (!) 149/76 (!) 144/69 (!) 154/85  Pulse: 75 77 84 84  Resp: 18 18 17 16   Temp: 98 F (36.7 C) 98.2 F (36.8 C) 98.4 F (36.9 C) (!) 97.5 F (36.4 C)  TempSrc: Oral Oral Oral Oral  SpO2: 98% 99% (!) 89% 92%  Weight:      Height:       General: Pt is alert, awake, not in acute distress Cardiovascular: RRR, S1/S2 +, no rubs, no gallops Respiratory: Mildly diminished bilaterally, no wheezing, no rhonchi; Unlabored Breathing and not wearing any supplemental O2 via [place] Abdominal: Soft, NT, Distended , bowel sounds + Extremities: no appreciable edema, no cyanosis  The results of significant diagnostics from this hospitalization (including imaging, microbiology, ancillary and laboratory) are listed below for reference.    Microbiology: Recent Results (from the past 240 hour(s))  Urine culture     Status: Abnormal   Collection Time: 12/27/19  2:00 PM   Specimen: Urine, Random  Result Value Ref Range Status   Specimen Description URINE, RANDOM  Final   Special Requests   Final    NONE Performed at [facility], [address], [place], [place] [address]    Culture MULTIPLE SPECIES PRESENT, SUGGEST RECOLLECTION (A)  Final   Report Status 12/28/2019 FINAL  Final  SARS Coronavirus 2 by RT PCR (hospital order, performed in [facility] hospital lab) Nasopharyngeal Nasopharyngeal Swab     Status: None   Collection Time: 12/27/19  2:09 PM   Specimen: Nasopharyngeal Swab  Result Value Ref Range Status   SARS Coronavirus 2  NEGATIVE NEGATIVE Final    Comment: (NOTE) SARS-CoV-2 target nucleic acids are NOT DETECTED.  The SARS-CoV-2 RNA is generally detectable in upper and lower respiratory specimens during the acute phase of infection. The lowest concentration of SARS-CoV-2 viral copies this assay can detect is 250 copies / mL. A negative result does not preclude SARS-CoV-2 infection and should not be used as the sole basis for treatment or other patient management decisions.  A negative result may occur with improper specimen collection / handling, submission of specimen other than nasopharyngeal swab, presence of viral mutation(s) within the areas targeted by this assay, and inadequate number of viral copies (<250 copies / mL). A negative result must be combined with clinical observations, patient history, and epidemiological information.  Fact Sheet for Patients:   StrictlyIdeas.no  Fact Sheet for Healthcare Providers: BankingDealers.co.za  This test is not yet approved or  cleared by the [place] FDA and has been authorized for detection and/or diagnosis of SARS-CoV-2 by FDA under an Emergency Use Authorization (EUA).  This EUA will remain in effect (meaning this test can be used) for the duration of the COVID-19 declaration under Section 564(b)(1) of the Act, 21 U.S.C. section 360bbb-3(b)(1), unless the authorization is terminated or revoked sooner.  Performed at [facility], [place] [address], [place], [place] [address]     Labs: BNP (last 3 results) No results for input(s): BNP in the last 8760 hours. Basic Metabolic Panel: Recent Labs  Lab 12/27/19 1400 12/28/19 0148 12/28/19 1150 12/29/19 0059  NA 142 141  --  138  K 2.9* 3.5  --  3.7  CL 103 105  --  105  CO2 26 28  --  28  GLUCOSE 115* 93  --  101*  BUN  25* 21  --  13  CREATININE 0.95 0.86  --  0.74  CALCIUM 9.4 9.3  --  9.2  MG 1.8  --  1.8 2.3  PHOS  --   --  3.3 2.8    Liver Function Tests: Recent Labs  Lab 12/27/19 1400 12/29/19 0059  AST 21 18  ALT 17 15  ALKPHOS 76 70  BILITOT 1.0 0.8  PROT 6.4* 6.1*  ALBUMIN 3.6 3.3*   No results for input(s): LIPASE, AMYLASE in the last 168 hours. No results for input(s): AMMONIA in the last 168 hours. CBC: Recent Labs  Lab 12/27/19 1400 12/28/19 0148 12/29/19 0059  WBC 6.6 6.6 6.3  NEUTROABS 4.8  --  4.2  HGB 15.2* 14.4 13.9  HCT 46.2* 43.7 42.5  MCV 100.4* 100.7* 99.8  PLT 229 195 200   Cardiac Enzymes: No results for input(s): CKTOTAL, CKMB, CKMBINDEX, TROPONINI in the last 168 hours. BNP: Invalid input(s): POCBNP CBG: No results for input(s): GLUCAP in the last 168 hours. D-Dimer No results for input(s): DDIMER in the last 72 hours. Hgb A1c No results for input(s): HGBA1C in the last 72 hours. Lipid Profile No results for input(s): CHOL, HDL, LDLCALC, TRIG, CHOLHDL, LDLDIRECT in the last 72 hours. Thyroid function studies Recent Labs    12/28/19 1248  TSH 1.184   Anemia work up No results for input(s): VITAMINB12, FOLATE, FERRITIN, TIBC, IRON, RETICCTPCT in the last 72 hours. Urinalysis    Component Value Date/Time   COLORURINE YELLOW 12/27/2019 1633   APPEARANCEUR CLEAR 12/27/2019 1633   LABSPEC 1.017 12/27/2019 1633   PHURINE 5.0 12/27/2019 1633   GLUCOSEU NEGATIVE 12/27/2019 1633   HGBUR NEGATIVE 12/27/2019 1633   BILIRUBINUR NEGATIVE 12/27/2019 1633   KETONESUR NEGATIVE 12/27/2019 1633   PROTEINUR NEGATIVE 12/27/2019 1633   UROBILINOGEN 0.2 06/18/2014 1950   NITRITE NEGATIVE 12/27/2019 1633   LEUKOCYTESUR TRACE (A) 12/27/2019 1633   Sepsis Labs Invalid input(s): PROCALCITONIN,  WBC,  LACTICIDVEN Microbiology Recent Results (from the past 240 hour(s))  Urine culture     Status: Abnormal   Collection Time: 12/27/19  2:00 PM   Specimen: Urine, Random  Result Value Ref Range Status   Specimen Description URINE, RANDOM  Final   Special Requests   Final     NONE Performed at [facility], [place] [address] [place], [place] [address]    Culture MULTIPLE SPECIES PRESENT, SUGGEST RECOLLECTION (A)  Final   Report Status 12/28/2019 FINAL  Final  SARS Coronavirus 2 by RT PCR (hospital order, performed in [facility] hospital lab) Nasopharyngeal Nasopharyngeal Swab     Status: None   Collection Time: 12/27/19  2:09 PM   Specimen: Nasopharyngeal Swab  Result Value Ref Range Status   SARS Coronavirus 2 NEGATIVE NEGATIVE Final    Comment: (NOTE) SARS-CoV-2 target nucleic acids are NOT DETECTED.  The SARS-CoV-2 RNA is generally detectable in upper and lower respiratory specimens during the acute phase of infection. The lowest concentration of SARS-CoV-2 viral copies this assay can detect is 250 copies / mL. A negative result does not preclude SARS-CoV-2 infection and should not be used as the sole basis for treatment or other patient management decisions.  A negative result may occur with improper specimen collection / handling, submission of specimen other than nasopharyngeal swab, presence of viral mutation(s) within the areas targeted by this assay, and inadequate number of viral copies (<250 copies / mL). A negative result must be combined with clinical observations,  patient history, and epidemiological information.  Fact Sheet for Patients:   StrictlyIdeas.no  Fact Sheet for Healthcare Providers: BankingDealers.co.za  This test is not yet approved or  cleared by the [place] FDA and has been authorized for detection and/or diagnosis of SARS-CoV-2 by FDA under an Emergency Use Authorization (EUA).  This EUA will remain in effect (meaning this test can be used) for the duration of the COVID-19 declaration under Section 564(b)(1) of the Act, 21 U.S.C. section 360bbb-3(b)(1), unless the authorization is terminated or revoked sooner.  Performed at [facility], [place] [address], [place], [place] [address]    Time coordinating discharge: 35 minutes  SIGNED:  Kerney Elbe, DO [facility] 12/30/2019, 12:26 PM Pager is on [place]  If 7PM-7AM, please contact night-coverage [email or web address]

## 2019-12-30 NOTE — TOC Transition Note (Signed)
Transition of Care [facility]) - CM/SW Discharge Note   Patient Details  Name: Jacqueline [name] MRN: [id number] Date of Birth: February 08, 1929  Transition of Care [facility]) CM/SW Contact:  Geralynn Ochs, LCSW Phone Number: 12/30/2019, 3:31 PM   Clinical Narrative:   Nurse to call report to [phone], Room 104; Ask for [place]    Final next level of care: Skilled Nursing Facility Barriers to Discharge: Barriers Resolved   Patient Goals and CMS Choice   CMS Medicare.gov Compare Post Acute Care list provided to:: Patient Represenative (must comment) Sherian Maroon) Choice offered to / list presented to : Adult Children  Discharge Placement              Patient chooses bed at:  ([facility]) Patient to be transferred to facility by: [place] Name of family member notified: [place] Patient and family notified of of transfer: 12/30/19  Discharge Plan and Services In-house Referral: Clinical Social Work                                   Social Determinants of Health (SDOH) Interventions     Readmission Risk Interventions No flowsheet data found.

## 2019-12-30 NOTE — Progress Notes (Signed)
Occupational Therapy Treatment Patient Details Name: Jacqueline [name] MRN: [id number] DOB: 17-May-1928 Today's Date: 12/30/2019    History of present illness 86 y.o. female with medical history significant for paroxysmal atrial fibrillation on Eliquis, chronic diastolic heart failure, hypertension, CVA, PAD and GI bleed who presents with concerns of worsening weakness   OT comments  Patient continues to make progress towards goals in skilled OT session. Patient's session encompassed bed mobility exercises and repositioning in session, as pt was found with one leg hanging out of the bed as pt was attempting to eat her breakfast (pt insisting it was lunch until reoriented). Pt pleasantly confused, however as session continued was able to follow one step commands with increased accuracy when provided max multi-modal cues. Pt perseverating on PT session earlier, though not able to reference it by name, stating "now this type of exercise I can deal with". Discharge remains appropriate, will continue to follow acutely.    Follow Up Recommendations  SNF;Supervision/Assistance - 24 hour    Equipment Recommendations  None recommended by OT    Recommendations for Other Services      Precautions / Restrictions Precautions Precautions: Fall Restrictions Weight Bearing Restrictions: No       Mobility Bed Mobility Overal bed mobility: Needs Assistance Bed Mobility: Supine to Sit;Sit to Supine     Supine to sit: Max assist;HOB elevated Sit to supine: Max assist   General bed mobility comments: pt agreed to OOB to test standing BP.  She needed  help sitting up but able to scoot out.  Pt needed help getting her legs back in the bed when laying down  Transfers Overall transfer level: Needs assistance Equipment used: Rolling walker (2 wheeled) Transfers: Sit to/from Stand Sit to Stand: Mod assist;+2 physical assistance Stand pivot transfers: Max assist       General transfer comment: Pt stood EOB  with RW to get orthostatic standing BP - she stood in flexed posture and posterior - leaning on bed.  Pt unabel to stand taller or wt shift with me.  pt sat back down and pt helped to scoot back and needed mod assist to scoot up in the bed.    Balance Overall balance assessment: Needs assistance;History of Falls   Sitting balance-Leahy Scale: Fair       Standing balance-Leahy Scale: Zero                             ADL either performed or assessed with clinical judgement   ADL Overall ADL's : Needs assistance/impaired Eating/Feeding: Set up                                     General ADL Comments: Session focus on bed mobility exercises due to increased confusion and inability to maintain attention to tasks     Vision       Perception     Praxis      Cognition Arousal/Alertness: Awake/alert Behavior During Therapy: WFL for tasks assessed/performed Overall Cognitive Status: History of cognitive impairments - at baseline Area of Impairment: Orientation;Attention;Memory;Following commands;Safety/judgement;Awareness;Problem solving                 Orientation Level: Disoriented to;Time;Situation Current Attention Level: Selective Memory: Decreased recall of precautions;Decreased short-term memory Following Commands: Follows one step commands with increased time Safety/Judgement: Decreased awareness of safety;Decreased awareness of deficits Awareness: Emergent  Problem Solving: Slow processing;Decreased initiation General Comments: pt stating "I live in [facility]" instead of [facility]        Exercises Exercises: Other exercises Total Joint Exercises Ankle Circles/Pumps: AAROM;Both;10 reps Straight Leg Raises: AAROM;Both;10 reps Other Exercises Other Exercises: Knee presses bilaterally x10   Shoulder Instructions       General Comments      Pertinent Vitals/ Pain       Pain Assessment: No/denies pain Faces Pain  Scale: Hurts whole lot Pain Location: back; knees Pain Descriptors / Indicators: Aching;Grimacing;Guarding;Moaning  Home Living                                          Prior Functioning/Environment              Frequency           Progress Toward Goals  OT Goals(current goals can now be found in the care plan section)  Progress towards OT goals: Progressing toward goals  Acute Rehab OT Goals Patient Stated Goal: to get better OT Goal Formulation: With patient Time For Goal Achievement: 01/11/20 Potential to Achieve Goals: [place] Discharge plan remains appropriate    Co-evaluation                 AM-PAC OT "6 Clicks" Daily Activity     Outcome Measure   Help from another person eating meals?: A Little Help from another person taking care of personal grooming?: A Little Help from another person toileting, which includes using toliet, bedpan, or urinal?: Total Help from another person bathing (including washing, rinsing, drying)?: A Lot Help from another person to put on and taking off regular upper body clothing?: A Lot Help from another person to put on and taking off regular lower body clothing?: A Lot 6 Click Score: 13    End of Session    OT Visit Diagnosis: Unsteadiness on feet (R26.81);Other abnormalities of gait and mobility (R26.89);Muscle weakness (generalized) (M62.81);History of falling (Z91.81);Other symptoms and signs involving cognitive function;Pain   Activity Tolerance Patient limited by lethargy;Patient limited by fatigue   Patient Left in bed;with call bell/phone within reach;with bed alarm set   Nurse Communication Mobility status;Need for lift equipment        Time: 1209-1233 OT Time Calculation (min): 24 min  Charges: OT General Charges $OT Visit: 1 Visit OT Treatments $Self Care/Home Management : 8-22 mins $Therapeutic Exercise: 8-22 mins  Corinne [place] E. [place], [place] Acute Rehabilitation  Services [place] 12/30/2019, 1:22 PM

## 2019-12-30 NOTE — TOC Progression Note (Signed)
Transition of Care [facility]) - Progression Note    Patient Details  Name: Jacqueline [name] MRN: [id number] Date of Birth: [birth year]  Transition of Care [facility]) CM/SW [place], [place] Phone Number: 12/30/2019, 10:34 AM  Clinical Narrative:   CSW spoke with patient's son, [name], about bed offers. Son chose [facility] for SNF. CSW reached out to [facility] to confirm bed availability.    Expected Discharge Plan: Skilled Nursing Facility Barriers to Discharge: SNF Pending bed offer  Expected Discharge Plan and Services Expected Discharge Plan: [place] In-house Referral: Clinical Social Work     Living arrangements for the past 2 months: [place]                                       Social Determinants of Health (SDOH) Interventions    Readmission Risk Interventions No flowsheet data found.

## 2019-12-30 NOTE — Progress Notes (Signed)
Physical Therapy Treatment Patient Details Name: Jacqueline [name] MRN: [id number] DOB: 27-Oct-1928 Today's Date: 12/30/2019    History of Present Illness 86 y.o. female with medical history significant for paroxysmal atrial fibrillation on Eliquis, chronic diastolic heart failure, hypertension, CVA, PAD and GI bleed who presents with concerns of worsening weakness    PT Comments    Pt very clear that she does not like physical therapy, does not like to exercise, and does not want to "overdo it" with activity. Pt was educated on the many benefits of mobility while admitted, and she was agreeable to attempt standing EOB. However, pt unable to achieve full stand with +2 assist and the [facility] for support. This is a decrease in function since PT evaluation on 8/ 28. Encouraged OOB and working with therapy as much as possible to minimize any further deconditioning. SNF level rehab remains appropriate at d/c. Will continue to follow.    Follow Up Recommendations  SNF;Supervision/Assistance - 24 hour     Equipment Recommendations  None recommended by PT    Recommendations for Other Services       Precautions / Restrictions Precautions Precautions: Fall Restrictions Weight Bearing Restrictions: No    Mobility  Bed Mobility Overal bed mobility: Needs Assistance Bed Mobility: Supine to Sit;Sit to Supine     Supine to sit: Max assist;HOB elevated;+2 for safety/equipment Sit to supine: Max assist;+2 for physical assistance   General bed mobility comments: HOB elevated - pt required assist for transition to/from EOB for all aspects. +2 for all position adjustments and scooting up to [facility].   Transfers Overall transfer level: Needs assistance   Transfers: Sit to/from Stand Sit to Stand: Total assist;From elevated surface Stand pivot transfers: Max assist       General transfer comment: Attempted sit<>stand with the [facility] for support. Pt unable to reach up for center bar due to baseline  bilateral shoulder ROM deficits/pain. +2 total assist attempted to achieve full stand but pt barely clearing EOB with hips. Pt not willing to attempt a second time.   Ambulation/Gait             General Gait Details: unable   Stairs             Wheelchair Mobility    Modified Rankin (Stroke Patients Only)       Balance Overall balance assessment: Needs assistance;History of Falls   Sitting balance-Leahy Scale: Fair       Standing balance-Leahy Scale: Zero                              Cognition Arousal/Alertness: Awake/alert Behavior During Therapy: WFL for tasks assessed/performed Overall Cognitive Status: History of cognitive impairments - at baseline Area of Impairment: Orientation;Attention;Memory;Following commands;Safety/judgement;Awareness;Problem solving                 Orientation Level: Disoriented to;Time;Situation Current Attention Level: Selective Memory: Decreased recall of precautions;Decreased short-term memory Following Commands: Follows one step commands with increased time Safety/Judgement: Decreased awareness of safety;Decreased awareness of deficits Awareness: Emergent Problem Solving: Slow processing;Decreased initiation General Comments: pt stating "I live [place] [facility] instead of [facility]      Exercises Total Joint Exercises Ankle Circles/Pumps: AAROM;Both;10 reps Straight Leg Raises: AAROM;Both;10 reps Other Exercises Other Exercises: Knee presses bilaterally x10    General Comments        Pertinent Vitals/Pain Pain Assessment: No/denies pain Faces Pain Scale: Hurts little more Pain  Location: back; knees Pain Descriptors / Indicators: Aching;Grimacing;Guarding;Moaning Pain Intervention(s): Limited activity within patient's tolerance;Monitored during session;Repositioned    Home Living                      Prior Function            PT Goals (current goals can now be found  in the care plan section) Acute Rehab PT Goals Patient Stated Goal: to get better PT Goal Formulation: With patient Time For Goal Achievement: 01/10/20 Potential to Achieve Goals: Fair Progress towards PT goals: Progressing toward goals    Frequency    Min 3X/week      PT Plan Current plan remains appropriate    Co-evaluation              AM-PAC PT "6 Clicks" Mobility   Outcome Measure  Help needed turning from your back to your side while in a flat bed without using bedrails?: A Lot Help needed moving from lying on your back to sitting on the side of a flat bed without using bedrails?: A Lot Help needed moving to and from a bed to a chair (including a wheelchair)?: Total Help needed standing up from a chair using your arms (e.g., wheelchair or bedside chair)?: Total Help needed to walk in hospital room?: Total Help needed climbing 3-5 steps with a railing? : Total 6 Click Score: 8    End of Session Equipment Utilized During Treatment: Gait belt Activity Tolerance: Patient limited by pain Patient left: in bed;with bed alarm set;with call bell/phone within reach Nurse Communication: Mobility status;Other (comment) (Purewick needs to be replaced) PT Visit Diagnosis: Other abnormalities of gait and mobility (R26.89);Muscle weakness (generalized) (M62.81);Difficulty in walking, not elsewhere classified (R26.2);Pain Pain - part of body: Knee     Time: 1025-1100 PT Time Calculation (min) (ACUTE ONLY): 35 min  Charges:  $Gait Training: 23-37 mins                     Rolinda Roan, PT, DPT Acute Rehabilitation Services Pager: [phone] Office: [place] 12/30/2019, 1:32 PM

## 2020-01-03 DIAGNOSIS — I5032 Chronic diastolic (congestive) heart failure: Secondary | ICD-10-CM | POA: Diagnosis not present

## 2020-01-03 DIAGNOSIS — I10 Essential (primary) hypertension: Secondary | ICD-10-CM | POA: Diagnosis not present

## 2020-01-03 DIAGNOSIS — M25561 Pain in right knee: Secondary | ICD-10-CM | POA: Diagnosis not present

## 2020-01-03 DIAGNOSIS — I4891 Unspecified atrial fibrillation: Secondary | ICD-10-CM | POA: Diagnosis not present

## 2020-01-03 DIAGNOSIS — I639 Cerebral infarction, unspecified: Secondary | ICD-10-CM | POA: Diagnosis not present

## 2020-01-03 DIAGNOSIS — F0281 Dementia in other diseases classified elsewhere with behavioral disturbance: Secondary | ICD-10-CM | POA: Diagnosis not present

## 2020-01-03 DIAGNOSIS — G3183 Dementia with Lewy bodies: Secondary | ICD-10-CM | POA: Diagnosis not present

## 2020-01-03 DIAGNOSIS — K219 Gastro-esophageal reflux disease without esophagitis: Secondary | ICD-10-CM | POA: Diagnosis not present

## 2020-01-14 DIAGNOSIS — G3183 Dementia with Lewy bodies: Secondary | ICD-10-CM | POA: Diagnosis not present

## 2020-01-27 DIAGNOSIS — F0281 Dementia in other diseases classified elsewhere with behavioral disturbance: Secondary | ICD-10-CM | POA: Diagnosis not present

## 2020-01-27 DIAGNOSIS — G3183 Dementia with Lewy bodies: Secondary | ICD-10-CM | POA: Diagnosis not present

## 2020-01-27 DIAGNOSIS — I4891 Unspecified atrial fibrillation: Secondary | ICD-10-CM | POA: Diagnosis not present

## 2020-01-27 DIAGNOSIS — F419 Anxiety disorder, unspecified: Secondary | ICD-10-CM | POA: Diagnosis not present

## 2020-01-27 DIAGNOSIS — I10 Essential (primary) hypertension: Secondary | ICD-10-CM | POA: Diagnosis not present

## 2020-01-27 DIAGNOSIS — I639 Cerebral infarction, unspecified: Secondary | ICD-10-CM | POA: Diagnosis not present

## 2020-01-27 DIAGNOSIS — K219 Gastro-esophageal reflux disease without esophagitis: Secondary | ICD-10-CM | POA: Diagnosis not present

## 2020-01-27 DIAGNOSIS — F4320 Adjustment disorder, unspecified: Secondary | ICD-10-CM | POA: Diagnosis not present

## 2020-03-06 DIAGNOSIS — M6281 Muscle weakness (generalized): Secondary | ICD-10-CM | POA: Diagnosis not present

## 2020-03-06 DIAGNOSIS — R2681 Unsteadiness on feet: Secondary | ICD-10-CM | POA: Diagnosis not present

## 2020-03-06 DIAGNOSIS — N3946 Mixed incontinence: Secondary | ICD-10-CM | POA: Diagnosis not present

## 2020-03-06 DIAGNOSIS — R296 Repeated falls: Secondary | ICD-10-CM | POA: Diagnosis not present

## 2020-03-09 DIAGNOSIS — M6281 Muscle weakness (generalized): Secondary | ICD-10-CM | POA: Diagnosis not present

## 2020-03-09 DIAGNOSIS — R2681 Unsteadiness on feet: Secondary | ICD-10-CM | POA: Diagnosis not present

## 2020-03-09 DIAGNOSIS — R296 Repeated falls: Secondary | ICD-10-CM | POA: Diagnosis not present

## 2020-03-09 DIAGNOSIS — N3946 Mixed incontinence: Secondary | ICD-10-CM | POA: Diagnosis not present

## 2020-03-10 DIAGNOSIS — R296 Repeated falls: Secondary | ICD-10-CM | POA: Diagnosis not present

## 2020-03-10 DIAGNOSIS — M6281 Muscle weakness (generalized): Secondary | ICD-10-CM | POA: Diagnosis not present

## 2020-03-10 DIAGNOSIS — R2681 Unsteadiness on feet: Secondary | ICD-10-CM | POA: Diagnosis not present

## 2020-03-10 DIAGNOSIS — N3946 Mixed incontinence: Secondary | ICD-10-CM | POA: Diagnosis not present

## 2020-03-11 DIAGNOSIS — R2681 Unsteadiness on feet: Secondary | ICD-10-CM | POA: Diagnosis not present

## 2020-03-11 DIAGNOSIS — R296 Repeated falls: Secondary | ICD-10-CM | POA: Diagnosis not present

## 2020-03-11 DIAGNOSIS — N3946 Mixed incontinence: Secondary | ICD-10-CM | POA: Diagnosis not present

## 2020-03-11 DIAGNOSIS — M6281 Muscle weakness (generalized): Secondary | ICD-10-CM | POA: Diagnosis not present

## 2020-03-12 DIAGNOSIS — M6281 Muscle weakness (generalized): Secondary | ICD-10-CM | POA: Diagnosis not present

## 2020-03-12 DIAGNOSIS — R2681 Unsteadiness on feet: Secondary | ICD-10-CM | POA: Diagnosis not present

## 2020-03-12 DIAGNOSIS — R296 Repeated falls: Secondary | ICD-10-CM | POA: Diagnosis not present

## 2020-03-12 DIAGNOSIS — N3946 Mixed incontinence: Secondary | ICD-10-CM | POA: Diagnosis not present

## 2020-03-13 DIAGNOSIS — M6281 Muscle weakness (generalized): Secondary | ICD-10-CM | POA: Diagnosis not present

## 2020-03-13 DIAGNOSIS — R2681 Unsteadiness on feet: Secondary | ICD-10-CM | POA: Diagnosis not present

## 2020-03-13 DIAGNOSIS — N3946 Mixed incontinence: Secondary | ICD-10-CM | POA: Diagnosis not present

## 2020-03-13 DIAGNOSIS — R296 Repeated falls: Secondary | ICD-10-CM | POA: Diagnosis not present

## 2020-03-16 DIAGNOSIS — R296 Repeated falls: Secondary | ICD-10-CM | POA: Diagnosis not present

## 2020-03-16 DIAGNOSIS — R2681 Unsteadiness on feet: Secondary | ICD-10-CM | POA: Diagnosis not present

## 2020-03-16 DIAGNOSIS — N3946 Mixed incontinence: Secondary | ICD-10-CM | POA: Diagnosis not present

## 2020-03-16 DIAGNOSIS — M6281 Muscle weakness (generalized): Secondary | ICD-10-CM | POA: Diagnosis not present

## 2020-03-17 DIAGNOSIS — R296 Repeated falls: Secondary | ICD-10-CM | POA: Diagnosis not present

## 2020-03-17 DIAGNOSIS — M6281 Muscle weakness (generalized): Secondary | ICD-10-CM | POA: Diagnosis not present

## 2020-03-17 DIAGNOSIS — R2681 Unsteadiness on feet: Secondary | ICD-10-CM | POA: Diagnosis not present

## 2020-03-17 DIAGNOSIS — N3946 Mixed incontinence: Secondary | ICD-10-CM | POA: Diagnosis not present

## 2020-03-18 DIAGNOSIS — R296 Repeated falls: Secondary | ICD-10-CM | POA: Diagnosis not present

## 2020-03-18 DIAGNOSIS — N3946 Mixed incontinence: Secondary | ICD-10-CM | POA: Diagnosis not present

## 2020-03-18 DIAGNOSIS — M6281 Muscle weakness (generalized): Secondary | ICD-10-CM | POA: Diagnosis not present

## 2020-03-18 DIAGNOSIS — R2681 Unsteadiness on feet: Secondary | ICD-10-CM | POA: Diagnosis not present

## 2020-03-19 DIAGNOSIS — R2681 Unsteadiness on feet: Secondary | ICD-10-CM | POA: Diagnosis not present

## 2020-03-19 DIAGNOSIS — N3946 Mixed incontinence: Secondary | ICD-10-CM | POA: Diagnosis not present

## 2020-03-19 DIAGNOSIS — M6281 Muscle weakness (generalized): Secondary | ICD-10-CM | POA: Diagnosis not present

## 2020-03-19 DIAGNOSIS — R296 Repeated falls: Secondary | ICD-10-CM | POA: Diagnosis not present

## 2020-03-20 DIAGNOSIS — M6281 Muscle weakness (generalized): Secondary | ICD-10-CM | POA: Diagnosis not present

## 2020-03-20 DIAGNOSIS — R2681 Unsteadiness on feet: Secondary | ICD-10-CM | POA: Diagnosis not present

## 2020-03-20 DIAGNOSIS — R296 Repeated falls: Secondary | ICD-10-CM | POA: Diagnosis not present

## 2020-03-20 DIAGNOSIS — N3946 Mixed incontinence: Secondary | ICD-10-CM | POA: Diagnosis not present

## 2020-03-22 DIAGNOSIS — R296 Repeated falls: Secondary | ICD-10-CM | POA: Diagnosis not present

## 2020-03-22 DIAGNOSIS — N3946 Mixed incontinence: Secondary | ICD-10-CM | POA: Diagnosis not present

## 2020-03-22 DIAGNOSIS — M6281 Muscle weakness (generalized): Secondary | ICD-10-CM | POA: Diagnosis not present

## 2020-03-22 DIAGNOSIS — R2681 Unsteadiness on feet: Secondary | ICD-10-CM | POA: Diagnosis not present

## 2020-03-23 DIAGNOSIS — N3946 Mixed incontinence: Secondary | ICD-10-CM | POA: Diagnosis not present

## 2020-03-23 DIAGNOSIS — R296 Repeated falls: Secondary | ICD-10-CM | POA: Diagnosis not present

## 2020-03-23 DIAGNOSIS — M6281 Muscle weakness (generalized): Secondary | ICD-10-CM | POA: Diagnosis not present

## 2020-03-23 DIAGNOSIS — R2681 Unsteadiness on feet: Secondary | ICD-10-CM | POA: Diagnosis not present

## 2020-03-24 DIAGNOSIS — M6281 Muscle weakness (generalized): Secondary | ICD-10-CM | POA: Diagnosis not present

## 2020-03-24 DIAGNOSIS — R296 Repeated falls: Secondary | ICD-10-CM | POA: Diagnosis not present

## 2020-03-24 DIAGNOSIS — R2681 Unsteadiness on feet: Secondary | ICD-10-CM | POA: Diagnosis not present

## 2020-03-24 DIAGNOSIS — N3946 Mixed incontinence: Secondary | ICD-10-CM | POA: Diagnosis not present

## 2020-03-25 DIAGNOSIS — N3946 Mixed incontinence: Secondary | ICD-10-CM | POA: Diagnosis not present

## 2020-03-25 DIAGNOSIS — R296 Repeated falls: Secondary | ICD-10-CM | POA: Diagnosis not present

## 2020-03-25 DIAGNOSIS — M6281 Muscle weakness (generalized): Secondary | ICD-10-CM | POA: Diagnosis not present

## 2020-03-25 DIAGNOSIS — R2681 Unsteadiness on feet: Secondary | ICD-10-CM | POA: Diagnosis not present

## 2020-03-27 DIAGNOSIS — M6281 Muscle weakness (generalized): Secondary | ICD-10-CM | POA: Diagnosis not present

## 2020-03-27 DIAGNOSIS — R2681 Unsteadiness on feet: Secondary | ICD-10-CM | POA: Diagnosis not present

## 2020-03-27 DIAGNOSIS — R296 Repeated falls: Secondary | ICD-10-CM | POA: Diagnosis not present

## 2020-03-27 DIAGNOSIS — N3946 Mixed incontinence: Secondary | ICD-10-CM | POA: Diagnosis not present

## 2020-03-30 DIAGNOSIS — R296 Repeated falls: Secondary | ICD-10-CM | POA: Diagnosis not present

## 2020-03-30 DIAGNOSIS — R2681 Unsteadiness on feet: Secondary | ICD-10-CM | POA: Diagnosis not present

## 2020-03-30 DIAGNOSIS — N3946 Mixed incontinence: Secondary | ICD-10-CM | POA: Diagnosis not present

## 2020-03-30 DIAGNOSIS — M6281 Muscle weakness (generalized): Secondary | ICD-10-CM | POA: Diagnosis not present

## 2020-03-31 DIAGNOSIS — R296 Repeated falls: Secondary | ICD-10-CM | POA: Diagnosis not present

## 2020-03-31 DIAGNOSIS — M6281 Muscle weakness (generalized): Secondary | ICD-10-CM | POA: Diagnosis not present

## 2020-03-31 DIAGNOSIS — N3946 Mixed incontinence: Secondary | ICD-10-CM | POA: Diagnosis not present

## 2020-03-31 DIAGNOSIS — R2681 Unsteadiness on feet: Secondary | ICD-10-CM | POA: Diagnosis not present

## 2020-04-01 DIAGNOSIS — N3946 Mixed incontinence: Secondary | ICD-10-CM | POA: Diagnosis not present

## 2020-04-01 DIAGNOSIS — M6281 Muscle weakness (generalized): Secondary | ICD-10-CM | POA: Diagnosis not present

## 2020-04-01 DIAGNOSIS — R296 Repeated falls: Secondary | ICD-10-CM | POA: Diagnosis not present

## 2020-04-01 DIAGNOSIS — R2681 Unsteadiness on feet: Secondary | ICD-10-CM | POA: Diagnosis not present

## 2020-04-02 DIAGNOSIS — N3946 Mixed incontinence: Secondary | ICD-10-CM | POA: Diagnosis not present

## 2020-04-02 DIAGNOSIS — R2681 Unsteadiness on feet: Secondary | ICD-10-CM | POA: Diagnosis not present

## 2020-04-02 DIAGNOSIS — M6281 Muscle weakness (generalized): Secondary | ICD-10-CM | POA: Diagnosis not present

## 2020-04-02 DIAGNOSIS — R296 Repeated falls: Secondary | ICD-10-CM | POA: Diagnosis not present

## 2020-04-03 DIAGNOSIS — N3946 Mixed incontinence: Secondary | ICD-10-CM | POA: Diagnosis not present

## 2020-04-03 DIAGNOSIS — R2681 Unsteadiness on feet: Secondary | ICD-10-CM | POA: Diagnosis not present

## 2020-04-03 DIAGNOSIS — R296 Repeated falls: Secondary | ICD-10-CM | POA: Diagnosis not present

## 2020-04-03 DIAGNOSIS — M6281 Muscle weakness (generalized): Secondary | ICD-10-CM | POA: Diagnosis not present

## 2020-04-06 DIAGNOSIS — R2681 Unsteadiness on feet: Secondary | ICD-10-CM | POA: Diagnosis not present

## 2020-04-06 DIAGNOSIS — R296 Repeated falls: Secondary | ICD-10-CM | POA: Diagnosis not present

## 2020-04-06 DIAGNOSIS — N3946 Mixed incontinence: Secondary | ICD-10-CM | POA: Diagnosis not present

## 2020-04-06 DIAGNOSIS — M6281 Muscle weakness (generalized): Secondary | ICD-10-CM | POA: Diagnosis not present

## 2020-04-07 DIAGNOSIS — R296 Repeated falls: Secondary | ICD-10-CM | POA: Diagnosis not present

## 2020-04-07 DIAGNOSIS — N3946 Mixed incontinence: Secondary | ICD-10-CM | POA: Diagnosis not present

## 2020-04-07 DIAGNOSIS — R2681 Unsteadiness on feet: Secondary | ICD-10-CM | POA: Diagnosis not present

## 2020-04-07 DIAGNOSIS — M6281 Muscle weakness (generalized): Secondary | ICD-10-CM | POA: Diagnosis not present

## 2020-04-08 DIAGNOSIS — R2681 Unsteadiness on feet: Secondary | ICD-10-CM | POA: Diagnosis not present

## 2020-04-08 DIAGNOSIS — M6281 Muscle weakness (generalized): Secondary | ICD-10-CM | POA: Diagnosis not present

## 2020-04-08 DIAGNOSIS — R296 Repeated falls: Secondary | ICD-10-CM | POA: Diagnosis not present

## 2020-04-08 DIAGNOSIS — N3946 Mixed incontinence: Secondary | ICD-10-CM | POA: Diagnosis not present

## 2020-04-09 DIAGNOSIS — R296 Repeated falls: Secondary | ICD-10-CM | POA: Diagnosis not present

## 2020-04-09 DIAGNOSIS — R2681 Unsteadiness on feet: Secondary | ICD-10-CM | POA: Diagnosis not present

## 2020-04-09 DIAGNOSIS — M6281 Muscle weakness (generalized): Secondary | ICD-10-CM | POA: Diagnosis not present

## 2020-04-09 DIAGNOSIS — N3946 Mixed incontinence: Secondary | ICD-10-CM | POA: Diagnosis not present

## 2020-04-10 DIAGNOSIS — R296 Repeated falls: Secondary | ICD-10-CM | POA: Diagnosis not present

## 2020-04-10 DIAGNOSIS — N3946 Mixed incontinence: Secondary | ICD-10-CM | POA: Diagnosis not present

## 2020-04-10 DIAGNOSIS — R2681 Unsteadiness on feet: Secondary | ICD-10-CM | POA: Diagnosis not present

## 2020-04-10 DIAGNOSIS — M6281 Muscle weakness (generalized): Secondary | ICD-10-CM | POA: Diagnosis not present

## 2020-04-13 DIAGNOSIS — R2681 Unsteadiness on feet: Secondary | ICD-10-CM | POA: Diagnosis not present

## 2020-04-13 DIAGNOSIS — M6281 Muscle weakness (generalized): Secondary | ICD-10-CM | POA: Diagnosis not present

## 2020-04-13 DIAGNOSIS — R296 Repeated falls: Secondary | ICD-10-CM | POA: Diagnosis not present

## 2020-04-13 DIAGNOSIS — N3946 Mixed incontinence: Secondary | ICD-10-CM | POA: Diagnosis not present

## 2020-04-14 DIAGNOSIS — R2681 Unsteadiness on feet: Secondary | ICD-10-CM | POA: Diagnosis not present

## 2020-04-14 DIAGNOSIS — R296 Repeated falls: Secondary | ICD-10-CM | POA: Diagnosis not present

## 2020-04-14 DIAGNOSIS — N3946 Mixed incontinence: Secondary | ICD-10-CM | POA: Diagnosis not present

## 2020-04-14 DIAGNOSIS — M6281 Muscle weakness (generalized): Secondary | ICD-10-CM | POA: Diagnosis not present

## 2020-04-15 DIAGNOSIS — N3946 Mixed incontinence: Secondary | ICD-10-CM | POA: Diagnosis not present

## 2020-04-15 DIAGNOSIS — M6281 Muscle weakness (generalized): Secondary | ICD-10-CM | POA: Diagnosis not present

## 2020-04-15 DIAGNOSIS — R296 Repeated falls: Secondary | ICD-10-CM | POA: Diagnosis not present

## 2020-04-15 DIAGNOSIS — R2681 Unsteadiness on feet: Secondary | ICD-10-CM | POA: Diagnosis not present

## 2020-04-16 DIAGNOSIS — R2681 Unsteadiness on feet: Secondary | ICD-10-CM | POA: Diagnosis not present

## 2020-04-16 DIAGNOSIS — N3946 Mixed incontinence: Secondary | ICD-10-CM | POA: Diagnosis not present

## 2020-04-16 DIAGNOSIS — M6281 Muscle weakness (generalized): Secondary | ICD-10-CM | POA: Diagnosis not present

## 2020-04-16 DIAGNOSIS — R296 Repeated falls: Secondary | ICD-10-CM | POA: Diagnosis not present

## 2020-04-17 DIAGNOSIS — R296 Repeated falls: Secondary | ICD-10-CM | POA: Diagnosis not present

## 2020-04-17 DIAGNOSIS — R2681 Unsteadiness on feet: Secondary | ICD-10-CM | POA: Diagnosis not present

## 2020-04-17 DIAGNOSIS — M6281 Muscle weakness (generalized): Secondary | ICD-10-CM | POA: Diagnosis not present

## 2020-04-17 DIAGNOSIS — N3946 Mixed incontinence: Secondary | ICD-10-CM | POA: Diagnosis not present

## 2020-04-20 DIAGNOSIS — R2681 Unsteadiness on feet: Secondary | ICD-10-CM | POA: Diagnosis not present

## 2020-04-20 DIAGNOSIS — R296 Repeated falls: Secondary | ICD-10-CM | POA: Diagnosis not present

## 2020-04-20 DIAGNOSIS — M6281 Muscle weakness (generalized): Secondary | ICD-10-CM | POA: Diagnosis not present

## 2020-04-20 DIAGNOSIS — N3946 Mixed incontinence: Secondary | ICD-10-CM | POA: Diagnosis not present

## 2020-04-21 DIAGNOSIS — R296 Repeated falls: Secondary | ICD-10-CM | POA: Diagnosis not present

## 2020-04-21 DIAGNOSIS — M6281 Muscle weakness (generalized): Secondary | ICD-10-CM | POA: Diagnosis not present

## 2020-04-21 DIAGNOSIS — N3946 Mixed incontinence: Secondary | ICD-10-CM | POA: Diagnosis not present

## 2020-04-21 DIAGNOSIS — R2681 Unsteadiness on feet: Secondary | ICD-10-CM | POA: Diagnosis not present

## 2020-04-22 DIAGNOSIS — M6281 Muscle weakness (generalized): Secondary | ICD-10-CM | POA: Diagnosis not present

## 2020-04-22 DIAGNOSIS — R296 Repeated falls: Secondary | ICD-10-CM | POA: Diagnosis not present

## 2020-04-22 DIAGNOSIS — N3946 Mixed incontinence: Secondary | ICD-10-CM | POA: Diagnosis not present

## 2020-04-22 DIAGNOSIS — R2681 Unsteadiness on feet: Secondary | ICD-10-CM | POA: Diagnosis not present

## 2020-04-23 DIAGNOSIS — M6281 Muscle weakness (generalized): Secondary | ICD-10-CM | POA: Diagnosis not present

## 2020-04-23 DIAGNOSIS — N3946 Mixed incontinence: Secondary | ICD-10-CM | POA: Diagnosis not present

## 2020-04-23 DIAGNOSIS — R2681 Unsteadiness on feet: Secondary | ICD-10-CM | POA: Diagnosis not present

## 2020-04-23 DIAGNOSIS — R296 Repeated falls: Secondary | ICD-10-CM | POA: Diagnosis not present

## 2020-04-24 DIAGNOSIS — R296 Repeated falls: Secondary | ICD-10-CM | POA: Diagnosis not present

## 2020-04-24 DIAGNOSIS — M6281 Muscle weakness (generalized): Secondary | ICD-10-CM | POA: Diagnosis not present

## 2020-04-24 DIAGNOSIS — R2681 Unsteadiness on feet: Secondary | ICD-10-CM | POA: Diagnosis not present

## 2020-04-24 DIAGNOSIS — N3946 Mixed incontinence: Secondary | ICD-10-CM | POA: Diagnosis not present

## 2020-04-27 DIAGNOSIS — N3946 Mixed incontinence: Secondary | ICD-10-CM | POA: Diagnosis not present

## 2020-04-27 DIAGNOSIS — R296 Repeated falls: Secondary | ICD-10-CM | POA: Diagnosis not present

## 2020-04-27 DIAGNOSIS — M6281 Muscle weakness (generalized): Secondary | ICD-10-CM | POA: Diagnosis not present

## 2020-04-27 DIAGNOSIS — R2681 Unsteadiness on feet: Secondary | ICD-10-CM | POA: Diagnosis not present

## 2020-04-28 DIAGNOSIS — M6281 Muscle weakness (generalized): Secondary | ICD-10-CM | POA: Diagnosis not present

## 2020-04-28 DIAGNOSIS — R296 Repeated falls: Secondary | ICD-10-CM | POA: Diagnosis not present

## 2020-04-28 DIAGNOSIS — R2681 Unsteadiness on feet: Secondary | ICD-10-CM | POA: Diagnosis not present

## 2020-04-28 DIAGNOSIS — N3946 Mixed incontinence: Secondary | ICD-10-CM | POA: Diagnosis not present

## 2020-04-29 DIAGNOSIS — M6281 Muscle weakness (generalized): Secondary | ICD-10-CM | POA: Diagnosis not present

## 2020-04-29 DIAGNOSIS — R296 Repeated falls: Secondary | ICD-10-CM | POA: Diagnosis not present

## 2020-04-29 DIAGNOSIS — N3946 Mixed incontinence: Secondary | ICD-10-CM | POA: Diagnosis not present

## 2020-04-29 DIAGNOSIS — R2681 Unsteadiness on feet: Secondary | ICD-10-CM | POA: Diagnosis not present

## 2020-04-30 DIAGNOSIS — N3946 Mixed incontinence: Secondary | ICD-10-CM | POA: Diagnosis not present

## 2020-04-30 DIAGNOSIS — M6281 Muscle weakness (generalized): Secondary | ICD-10-CM | POA: Diagnosis not present

## 2020-04-30 DIAGNOSIS — R2681 Unsteadiness on feet: Secondary | ICD-10-CM | POA: Diagnosis not present

## 2020-04-30 DIAGNOSIS — R296 Repeated falls: Secondary | ICD-10-CM | POA: Diagnosis not present

## 2020-05-04 DIAGNOSIS — R2681 Unsteadiness on feet: Secondary | ICD-10-CM | POA: Diagnosis not present

## 2020-05-04 DIAGNOSIS — M6281 Muscle weakness (generalized): Secondary | ICD-10-CM | POA: Diagnosis not present

## 2020-05-04 DIAGNOSIS — R296 Repeated falls: Secondary | ICD-10-CM | POA: Diagnosis not present

## 2020-05-04 DIAGNOSIS — N3946 Mixed incontinence: Secondary | ICD-10-CM | POA: Diagnosis not present

## 2020-05-05 DIAGNOSIS — R2681 Unsteadiness on feet: Secondary | ICD-10-CM | POA: Diagnosis not present

## 2020-05-05 DIAGNOSIS — N3946 Mixed incontinence: Secondary | ICD-10-CM | POA: Diagnosis not present

## 2020-05-05 DIAGNOSIS — R296 Repeated falls: Secondary | ICD-10-CM | POA: Diagnosis not present

## 2020-05-05 DIAGNOSIS — M6281 Muscle weakness (generalized): Secondary | ICD-10-CM | POA: Diagnosis not present

## 2020-05-06 DIAGNOSIS — R2681 Unsteadiness on feet: Secondary | ICD-10-CM | POA: Diagnosis not present

## 2020-05-06 DIAGNOSIS — M6281 Muscle weakness (generalized): Secondary | ICD-10-CM | POA: Diagnosis not present

## 2020-05-06 DIAGNOSIS — R296 Repeated falls: Secondary | ICD-10-CM | POA: Diagnosis not present

## 2020-05-06 DIAGNOSIS — N3946 Mixed incontinence: Secondary | ICD-10-CM | POA: Diagnosis not present

## 2020-05-07 DIAGNOSIS — R296 Repeated falls: Secondary | ICD-10-CM | POA: Diagnosis not present

## 2020-05-07 DIAGNOSIS — N3946 Mixed incontinence: Secondary | ICD-10-CM | POA: Diagnosis not present

## 2020-05-07 DIAGNOSIS — R2681 Unsteadiness on feet: Secondary | ICD-10-CM | POA: Diagnosis not present

## 2020-05-07 DIAGNOSIS — M6281 Muscle weakness (generalized): Secondary | ICD-10-CM | POA: Diagnosis not present

## 2020-05-08 DIAGNOSIS — R2681 Unsteadiness on feet: Secondary | ICD-10-CM | POA: Diagnosis not present

## 2020-05-08 DIAGNOSIS — N3946 Mixed incontinence: Secondary | ICD-10-CM | POA: Diagnosis not present

## 2020-05-08 DIAGNOSIS — R296 Repeated falls: Secondary | ICD-10-CM | POA: Diagnosis not present

## 2020-05-08 DIAGNOSIS — M6281 Muscle weakness (generalized): Secondary | ICD-10-CM | POA: Diagnosis not present

## 2020-05-10 DIAGNOSIS — R2681 Unsteadiness on feet: Secondary | ICD-10-CM | POA: Diagnosis not present

## 2020-05-10 DIAGNOSIS — R296 Repeated falls: Secondary | ICD-10-CM | POA: Diagnosis not present

## 2020-05-10 DIAGNOSIS — N3946 Mixed incontinence: Secondary | ICD-10-CM | POA: Diagnosis not present

## 2020-05-10 DIAGNOSIS — M6281 Muscle weakness (generalized): Secondary | ICD-10-CM | POA: Diagnosis not present

## 2020-05-11 DIAGNOSIS — N3946 Mixed incontinence: Secondary | ICD-10-CM | POA: Diagnosis not present

## 2020-05-11 DIAGNOSIS — M6281 Muscle weakness (generalized): Secondary | ICD-10-CM | POA: Diagnosis not present

## 2020-05-11 DIAGNOSIS — R296 Repeated falls: Secondary | ICD-10-CM | POA: Diagnosis not present

## 2020-05-11 DIAGNOSIS — R2681 Unsteadiness on feet: Secondary | ICD-10-CM | POA: Diagnosis not present

## 2020-05-12 DIAGNOSIS — M6281 Muscle weakness (generalized): Secondary | ICD-10-CM | POA: Diagnosis not present

## 2020-05-12 DIAGNOSIS — R2681 Unsteadiness on feet: Secondary | ICD-10-CM | POA: Diagnosis not present

## 2020-05-12 DIAGNOSIS — N3946 Mixed incontinence: Secondary | ICD-10-CM | POA: Diagnosis not present

## 2020-05-12 DIAGNOSIS — R296 Repeated falls: Secondary | ICD-10-CM | POA: Diagnosis not present

## 2020-05-13 DIAGNOSIS — M6281 Muscle weakness (generalized): Secondary | ICD-10-CM | POA: Diagnosis not present

## 2020-05-13 DIAGNOSIS — R2681 Unsteadiness on feet: Secondary | ICD-10-CM | POA: Diagnosis not present

## 2020-05-13 DIAGNOSIS — N3946 Mixed incontinence: Secondary | ICD-10-CM | POA: Diagnosis not present

## 2020-05-13 DIAGNOSIS — R296 Repeated falls: Secondary | ICD-10-CM | POA: Diagnosis not present

## 2020-05-14 DIAGNOSIS — M6281 Muscle weakness (generalized): Secondary | ICD-10-CM | POA: Diagnosis not present

## 2020-05-14 DIAGNOSIS — R2681 Unsteadiness on feet: Secondary | ICD-10-CM | POA: Diagnosis not present

## 2020-05-14 DIAGNOSIS — R296 Repeated falls: Secondary | ICD-10-CM | POA: Diagnosis not present

## 2020-05-14 DIAGNOSIS — N3946 Mixed incontinence: Secondary | ICD-10-CM | POA: Diagnosis not present

## 2020-05-15 DIAGNOSIS — N3946 Mixed incontinence: Secondary | ICD-10-CM | POA: Diagnosis not present

## 2020-05-15 DIAGNOSIS — R2681 Unsteadiness on feet: Secondary | ICD-10-CM | POA: Diagnosis not present

## 2020-05-15 DIAGNOSIS — R296 Repeated falls: Secondary | ICD-10-CM | POA: Diagnosis not present

## 2020-05-15 DIAGNOSIS — M6281 Muscle weakness (generalized): Secondary | ICD-10-CM | POA: Diagnosis not present

## 2020-05-20 DIAGNOSIS — N3946 Mixed incontinence: Secondary | ICD-10-CM | POA: Diagnosis not present

## 2020-05-20 DIAGNOSIS — R296 Repeated falls: Secondary | ICD-10-CM | POA: Diagnosis not present

## 2020-05-20 DIAGNOSIS — M6281 Muscle weakness (generalized): Secondary | ICD-10-CM | POA: Diagnosis not present

## 2020-05-20 DIAGNOSIS — R2681 Unsteadiness on feet: Secondary | ICD-10-CM | POA: Diagnosis not present

## 2020-05-21 DIAGNOSIS — M6281 Muscle weakness (generalized): Secondary | ICD-10-CM | POA: Diagnosis not present

## 2020-05-21 DIAGNOSIS — N3946 Mixed incontinence: Secondary | ICD-10-CM | POA: Diagnosis not present

## 2020-05-21 DIAGNOSIS — R296 Repeated falls: Secondary | ICD-10-CM | POA: Diagnosis not present

## 2020-05-21 DIAGNOSIS — R2681 Unsteadiness on feet: Secondary | ICD-10-CM | POA: Diagnosis not present

## 2020-05-22 DIAGNOSIS — R296 Repeated falls: Secondary | ICD-10-CM | POA: Diagnosis not present

## 2020-05-22 DIAGNOSIS — R2681 Unsteadiness on feet: Secondary | ICD-10-CM | POA: Diagnosis not present

## 2020-05-22 DIAGNOSIS — M6281 Muscle weakness (generalized): Secondary | ICD-10-CM | POA: Diagnosis not present

## 2020-05-22 DIAGNOSIS — N3946 Mixed incontinence: Secondary | ICD-10-CM | POA: Diagnosis not present

## 2020-05-23 DIAGNOSIS — R296 Repeated falls: Secondary | ICD-10-CM | POA: Diagnosis not present

## 2020-05-23 DIAGNOSIS — N3946 Mixed incontinence: Secondary | ICD-10-CM | POA: Diagnosis not present

## 2020-05-23 DIAGNOSIS — M6281 Muscle weakness (generalized): Secondary | ICD-10-CM | POA: Diagnosis not present

## 2020-05-23 DIAGNOSIS — R2681 Unsteadiness on feet: Secondary | ICD-10-CM | POA: Diagnosis not present

## 2020-05-25 DIAGNOSIS — N3946 Mixed incontinence: Secondary | ICD-10-CM | POA: Diagnosis not present

## 2020-05-25 DIAGNOSIS — R2681 Unsteadiness on feet: Secondary | ICD-10-CM | POA: Diagnosis not present

## 2020-05-25 DIAGNOSIS — R296 Repeated falls: Secondary | ICD-10-CM | POA: Diagnosis not present

## 2020-05-25 DIAGNOSIS — M6281 Muscle weakness (generalized): Secondary | ICD-10-CM | POA: Diagnosis not present

## 2020-05-26 DIAGNOSIS — M6281 Muscle weakness (generalized): Secondary | ICD-10-CM | POA: Diagnosis not present

## 2020-05-26 DIAGNOSIS — N3946 Mixed incontinence: Secondary | ICD-10-CM | POA: Diagnosis not present

## 2020-05-26 DIAGNOSIS — R296 Repeated falls: Secondary | ICD-10-CM | POA: Diagnosis not present

## 2020-05-26 DIAGNOSIS — R2681 Unsteadiness on feet: Secondary | ICD-10-CM | POA: Diagnosis not present

## 2020-05-27 DIAGNOSIS — R296 Repeated falls: Secondary | ICD-10-CM | POA: Diagnosis not present

## 2020-05-27 DIAGNOSIS — M6281 Muscle weakness (generalized): Secondary | ICD-10-CM | POA: Diagnosis not present

## 2020-05-27 DIAGNOSIS — N3946 Mixed incontinence: Secondary | ICD-10-CM | POA: Diagnosis not present

## 2020-05-27 DIAGNOSIS — R2681 Unsteadiness on feet: Secondary | ICD-10-CM | POA: Diagnosis not present

## 2020-05-28 DIAGNOSIS — N3946 Mixed incontinence: Secondary | ICD-10-CM | POA: Diagnosis not present

## 2020-05-28 DIAGNOSIS — M6281 Muscle weakness (generalized): Secondary | ICD-10-CM | POA: Diagnosis not present

## 2020-05-28 DIAGNOSIS — R296 Repeated falls: Secondary | ICD-10-CM | POA: Diagnosis not present

## 2020-05-28 DIAGNOSIS — R2681 Unsteadiness on feet: Secondary | ICD-10-CM | POA: Diagnosis not present

## 2020-05-29 DIAGNOSIS — R2681 Unsteadiness on feet: Secondary | ICD-10-CM | POA: Diagnosis not present

## 2020-05-29 DIAGNOSIS — R296 Repeated falls: Secondary | ICD-10-CM | POA: Diagnosis not present

## 2020-05-29 DIAGNOSIS — M6281 Muscle weakness (generalized): Secondary | ICD-10-CM | POA: Diagnosis not present

## 2020-05-29 DIAGNOSIS — N3946 Mixed incontinence: Secondary | ICD-10-CM | POA: Diagnosis not present

## 2020-05-31 DIAGNOSIS — N3946 Mixed incontinence: Secondary | ICD-10-CM | POA: Diagnosis not present

## 2020-05-31 DIAGNOSIS — R2681 Unsteadiness on feet: Secondary | ICD-10-CM | POA: Diagnosis not present

## 2020-05-31 DIAGNOSIS — R296 Repeated falls: Secondary | ICD-10-CM | POA: Diagnosis not present

## 2020-05-31 DIAGNOSIS — M6281 Muscle weakness (generalized): Secondary | ICD-10-CM | POA: Diagnosis not present

## 2020-06-01 DIAGNOSIS — N3946 Mixed incontinence: Secondary | ICD-10-CM | POA: Diagnosis not present

## 2020-06-01 DIAGNOSIS — M6281 Muscle weakness (generalized): Secondary | ICD-10-CM | POA: Diagnosis not present

## 2020-06-01 DIAGNOSIS — R296 Repeated falls: Secondary | ICD-10-CM | POA: Diagnosis not present

## 2020-06-01 DIAGNOSIS — R2681 Unsteadiness on feet: Secondary | ICD-10-CM | POA: Diagnosis not present

## 2020-06-02 DIAGNOSIS — R2681 Unsteadiness on feet: Secondary | ICD-10-CM | POA: Diagnosis not present

## 2020-06-02 DIAGNOSIS — M6281 Muscle weakness (generalized): Secondary | ICD-10-CM | POA: Diagnosis not present

## 2020-06-02 DIAGNOSIS — R296 Repeated falls: Secondary | ICD-10-CM | POA: Diagnosis not present

## 2020-06-02 DIAGNOSIS — N3946 Mixed incontinence: Secondary | ICD-10-CM | POA: Diagnosis not present

## 2020-06-03 DIAGNOSIS — R296 Repeated falls: Secondary | ICD-10-CM | POA: Diagnosis not present

## 2020-06-03 DIAGNOSIS — N3946 Mixed incontinence: Secondary | ICD-10-CM | POA: Diagnosis not present

## 2020-06-03 DIAGNOSIS — R2681 Unsteadiness on feet: Secondary | ICD-10-CM | POA: Diagnosis not present

## 2020-06-03 DIAGNOSIS — M6281 Muscle weakness (generalized): Secondary | ICD-10-CM | POA: Diagnosis not present

## 2020-06-04 DIAGNOSIS — M6281 Muscle weakness (generalized): Secondary | ICD-10-CM | POA: Diagnosis not present

## 2020-06-04 DIAGNOSIS — R2681 Unsteadiness on feet: Secondary | ICD-10-CM | POA: Diagnosis not present

## 2020-06-04 DIAGNOSIS — N3946 Mixed incontinence: Secondary | ICD-10-CM | POA: Diagnosis not present

## 2020-06-04 DIAGNOSIS — R296 Repeated falls: Secondary | ICD-10-CM | POA: Diagnosis not present

## 2020-06-05 DIAGNOSIS — M6281 Muscle weakness (generalized): Secondary | ICD-10-CM | POA: Diagnosis not present

## 2020-06-05 DIAGNOSIS — N3946 Mixed incontinence: Secondary | ICD-10-CM | POA: Diagnosis not present

## 2020-06-05 DIAGNOSIS — R2681 Unsteadiness on feet: Secondary | ICD-10-CM | POA: Diagnosis not present

## 2020-06-05 DIAGNOSIS — R296 Repeated falls: Secondary | ICD-10-CM | POA: Diagnosis not present

## 2020-06-08 DIAGNOSIS — M6281 Muscle weakness (generalized): Secondary | ICD-10-CM | POA: Diagnosis not present

## 2020-06-08 DIAGNOSIS — R2681 Unsteadiness on feet: Secondary | ICD-10-CM | POA: Diagnosis not present

## 2020-06-08 DIAGNOSIS — R296 Repeated falls: Secondary | ICD-10-CM | POA: Diagnosis not present

## 2020-06-08 DIAGNOSIS — N3946 Mixed incontinence: Secondary | ICD-10-CM | POA: Diagnosis not present

## 2020-06-09 DIAGNOSIS — N3946 Mixed incontinence: Secondary | ICD-10-CM | POA: Diagnosis not present

## 2020-06-09 DIAGNOSIS — M6281 Muscle weakness (generalized): Secondary | ICD-10-CM | POA: Diagnosis not present

## 2020-06-09 DIAGNOSIS — R296 Repeated falls: Secondary | ICD-10-CM | POA: Diagnosis not present

## 2020-06-09 DIAGNOSIS — R2681 Unsteadiness on feet: Secondary | ICD-10-CM | POA: Diagnosis not present

## 2020-06-10 DIAGNOSIS — N3946 Mixed incontinence: Secondary | ICD-10-CM | POA: Diagnosis not present

## 2020-06-10 DIAGNOSIS — R2681 Unsteadiness on feet: Secondary | ICD-10-CM | POA: Diagnosis not present

## 2020-06-10 DIAGNOSIS — M6281 Muscle weakness (generalized): Secondary | ICD-10-CM | POA: Diagnosis not present

## 2020-06-10 DIAGNOSIS — R296 Repeated falls: Secondary | ICD-10-CM | POA: Diagnosis not present

## 2020-06-11 DIAGNOSIS — N3946 Mixed incontinence: Secondary | ICD-10-CM | POA: Diagnosis not present

## 2020-06-11 DIAGNOSIS — R2681 Unsteadiness on feet: Secondary | ICD-10-CM | POA: Diagnosis not present

## 2020-06-11 DIAGNOSIS — M6281 Muscle weakness (generalized): Secondary | ICD-10-CM | POA: Diagnosis not present

## 2020-06-11 DIAGNOSIS — R296 Repeated falls: Secondary | ICD-10-CM | POA: Diagnosis not present

## 2020-06-12 DIAGNOSIS — N3946 Mixed incontinence: Secondary | ICD-10-CM | POA: Diagnosis not present

## 2020-06-12 DIAGNOSIS — R2681 Unsteadiness on feet: Secondary | ICD-10-CM | POA: Diagnosis not present

## 2020-06-12 DIAGNOSIS — R296 Repeated falls: Secondary | ICD-10-CM | POA: Diagnosis not present

## 2020-06-12 DIAGNOSIS — M6281 Muscle weakness (generalized): Secondary | ICD-10-CM | POA: Diagnosis not present

## 2020-06-15 DIAGNOSIS — R296 Repeated falls: Secondary | ICD-10-CM | POA: Diagnosis not present

## 2020-06-15 DIAGNOSIS — R2681 Unsteadiness on feet: Secondary | ICD-10-CM | POA: Diagnosis not present

## 2020-06-15 DIAGNOSIS — N3946 Mixed incontinence: Secondary | ICD-10-CM | POA: Diagnosis not present

## 2020-06-15 DIAGNOSIS — M6281 Muscle weakness (generalized): Secondary | ICD-10-CM | POA: Diagnosis not present

## 2020-06-17 DIAGNOSIS — M6281 Muscle weakness (generalized): Secondary | ICD-10-CM | POA: Diagnosis not present

## 2020-06-17 DIAGNOSIS — N3946 Mixed incontinence: Secondary | ICD-10-CM | POA: Diagnosis not present

## 2020-06-17 DIAGNOSIS — R296 Repeated falls: Secondary | ICD-10-CM | POA: Diagnosis not present

## 2020-06-17 DIAGNOSIS — R2681 Unsteadiness on feet: Secondary | ICD-10-CM | POA: Diagnosis not present

## 2020-06-18 DIAGNOSIS — N3946 Mixed incontinence: Secondary | ICD-10-CM | POA: Diagnosis not present

## 2020-06-18 DIAGNOSIS — R296 Repeated falls: Secondary | ICD-10-CM | POA: Diagnosis not present

## 2020-06-18 DIAGNOSIS — M6281 Muscle weakness (generalized): Secondary | ICD-10-CM | POA: Diagnosis not present

## 2020-06-18 DIAGNOSIS — R2681 Unsteadiness on feet: Secondary | ICD-10-CM | POA: Diagnosis not present

## 2020-06-19 ENCOUNTER — Emergency Department (HOSPITAL_COMMUNITY): Payer: Medicare Other

## 2020-06-19 ENCOUNTER — Encounter (HOSPITAL_COMMUNITY): Payer: Self-pay | Admitting: Emergency Medicine

## 2020-06-19 ENCOUNTER — Inpatient Hospital Stay (HOSPITAL_COMMUNITY)
Admission: EM | Admit: 2020-06-19 | Discharge: 2020-06-25 | DRG: 481 | Disposition: A | Discharge status: Skilled Nursing Facility | Payer: Medicare Other | Attending: Internal Medicine | Admitting: Internal Medicine

## 2020-06-19 ENCOUNTER — Other Ambulatory Visit: Payer: Self-pay

## 2020-06-19 ENCOUNTER — Inpatient Hospital Stay (HOSPITAL_COMMUNITY): Payer: Medicare Other

## 2020-06-19 DIAGNOSIS — C801 Malignant (primary) neoplasm, unspecified: Secondary | ICD-10-CM | POA: Diagnosis present

## 2020-06-19 DIAGNOSIS — D62 Acute posthemorrhagic anemia: Secondary | ICD-10-CM | POA: Diagnosis not present

## 2020-06-19 DIAGNOSIS — F0390 Unspecified dementia without behavioral disturbance: Secondary | ICD-10-CM | POA: Diagnosis not present

## 2020-06-19 DIAGNOSIS — F0391 Unspecified dementia with behavioral disturbance: Secondary | ICD-10-CM | POA: Diagnosis present

## 2020-06-19 DIAGNOSIS — Z880 Allergy status to penicillin: Secondary | ICD-10-CM | POA: Diagnosis not present

## 2020-06-19 DIAGNOSIS — E876 Hypokalemia: Secondary | ICD-10-CM | POA: Diagnosis present

## 2020-06-19 DIAGNOSIS — R410 Disorientation, unspecified: Secondary | ICD-10-CM | POA: Diagnosis not present

## 2020-06-19 DIAGNOSIS — I9589 Other hypotension: Secondary | ICD-10-CM | POA: Diagnosis not present

## 2020-06-19 DIAGNOSIS — M84453A Pathological fracture, unspecified femur, initial encounter for fracture: Secondary | ICD-10-CM | POA: Diagnosis not present

## 2020-06-19 DIAGNOSIS — Z79899 Other long term (current) drug therapy: Secondary | ICD-10-CM | POA: Diagnosis not present

## 2020-06-19 DIAGNOSIS — S79912A Unspecified injury of left hip, initial encounter: Secondary | ICD-10-CM | POA: Diagnosis not present

## 2020-06-19 DIAGNOSIS — W19XXXA Unspecified fall, initial encounter: Secondary | ICD-10-CM | POA: Diagnosis not present

## 2020-06-19 DIAGNOSIS — S7290XA Unspecified fracture of unspecified femur, initial encounter for closed fracture: Secondary | ICD-10-CM | POA: Diagnosis present

## 2020-06-19 DIAGNOSIS — R279 Unspecified lack of coordination: Secondary | ICD-10-CM | POA: Diagnosis not present

## 2020-06-19 DIAGNOSIS — E2740 Unspecified adrenocortical insufficiency: Secondary | ICD-10-CM | POA: Diagnosis present

## 2020-06-19 DIAGNOSIS — S72342D Displaced spiral fracture of shaft of left femur, subsequent encounter for closed fracture with routine healing: Secondary | ICD-10-CM | POA: Diagnosis not present

## 2020-06-19 DIAGNOSIS — M84552A Pathological fracture in neoplastic disease, left femur, initial encounter for fracture: Principal | ICD-10-CM | POA: Diagnosis present

## 2020-06-19 DIAGNOSIS — I10 Essential (primary) hypertension: Secondary | ICD-10-CM | POA: Diagnosis present

## 2020-06-19 DIAGNOSIS — Z8673 Personal history of transient ischemic attack (TIA), and cerebral infarction without residual deficits: Secondary | ICD-10-CM | POA: Diagnosis not present

## 2020-06-19 DIAGNOSIS — R2681 Unsteadiness on feet: Secondary | ICD-10-CM | POA: Diagnosis not present

## 2020-06-19 DIAGNOSIS — Z7901 Long term (current) use of anticoagulants: Secondary | ICD-10-CM

## 2020-06-19 DIAGNOSIS — Z4889 Encounter for other specified surgical aftercare: Secondary | ICD-10-CM | POA: Diagnosis not present

## 2020-06-19 DIAGNOSIS — R739 Hyperglycemia, unspecified: Secondary | ICD-10-CM | POA: Diagnosis present

## 2020-06-19 DIAGNOSIS — R41841 Cognitive communication deficit: Secondary | ICD-10-CM | POA: Diagnosis not present

## 2020-06-19 DIAGNOSIS — F419 Anxiety disorder, unspecified: Secondary | ICD-10-CM | POA: Diagnosis present

## 2020-06-19 DIAGNOSIS — Z833 Family history of diabetes mellitus: Secondary | ICD-10-CM

## 2020-06-19 DIAGNOSIS — C7800 Secondary malignant neoplasm of unspecified lung: Secondary | ICD-10-CM | POA: Diagnosis not present

## 2020-06-19 DIAGNOSIS — Z743 Need for continuous supervision: Secondary | ICD-10-CM | POA: Diagnosis not present

## 2020-06-19 DIAGNOSIS — R748 Abnormal levels of other serum enzymes: Secondary | ICD-10-CM | POA: Diagnosis present

## 2020-06-19 DIAGNOSIS — Z886 Allergy status to analgesic agent status: Secondary | ICD-10-CM

## 2020-06-19 DIAGNOSIS — S72002A Fracture of unspecified part of neck of left femur, initial encounter for closed fracture: Secondary | ICD-10-CM | POA: Diagnosis not present

## 2020-06-19 DIAGNOSIS — R52 Pain, unspecified: Secondary | ICD-10-CM | POA: Diagnosis not present

## 2020-06-19 DIAGNOSIS — R4182 Altered mental status, unspecified: Secondary | ICD-10-CM | POA: Diagnosis not present

## 2020-06-19 DIAGNOSIS — R0902 Hypoxemia: Secondary | ICD-10-CM | POA: Diagnosis not present

## 2020-06-19 DIAGNOSIS — M1612 Unilateral primary osteoarthritis, left hip: Secondary | ICD-10-CM | POA: Diagnosis not present

## 2020-06-19 DIAGNOSIS — Z87891 Personal history of nicotine dependence: Secondary | ICD-10-CM | POA: Diagnosis not present

## 2020-06-19 DIAGNOSIS — K5730 Diverticulosis of large intestine without perforation or abscess without bleeding: Secondary | ICD-10-CM | POA: Diagnosis not present

## 2020-06-19 DIAGNOSIS — S2232XA Fracture of one rib, left side, initial encounter for closed fracture: Secondary | ICD-10-CM | POA: Diagnosis not present

## 2020-06-19 DIAGNOSIS — R1312 Dysphagia, oropharyngeal phase: Secondary | ICD-10-CM | POA: Diagnosis not present

## 2020-06-19 DIAGNOSIS — I480 Paroxysmal atrial fibrillation: Secondary | ICD-10-CM | POA: Diagnosis present

## 2020-06-19 DIAGNOSIS — Z043 Encounter for examination and observation following other accident: Secondary | ICD-10-CM | POA: Diagnosis not present

## 2020-06-19 DIAGNOSIS — M8440XA Pathological fracture, unspecified site, initial encounter for fracture: Secondary | ICD-10-CM | POA: Diagnosis not present

## 2020-06-19 DIAGNOSIS — D529 Folate deficiency anemia, unspecified: Secondary | ICD-10-CM | POA: Diagnosis not present

## 2020-06-19 DIAGNOSIS — R059 Cough, unspecified: Secondary | ICD-10-CM | POA: Diagnosis not present

## 2020-06-19 DIAGNOSIS — S72142A Displaced intertrochanteric fracture of left femur, initial encounter for closed fracture: Secondary | ICD-10-CM | POA: Diagnosis not present

## 2020-06-19 DIAGNOSIS — M6281 Muscle weakness (generalized): Secondary | ICD-10-CM | POA: Diagnosis not present

## 2020-06-19 DIAGNOSIS — I5033 Acute on chronic diastolic (congestive) heart failure: Secondary | ICD-10-CM | POA: Diagnosis not present

## 2020-06-19 DIAGNOSIS — C7951 Secondary malignant neoplasm of bone: Secondary | ICD-10-CM | POA: Diagnosis present

## 2020-06-19 DIAGNOSIS — S72009A Fracture of unspecified part of neck of unspecified femur, initial encounter for closed fracture: Secondary | ICD-10-CM | POA: Diagnosis present

## 2020-06-19 DIAGNOSIS — N179 Acute kidney failure, unspecified: Secondary | ICD-10-CM | POA: Diagnosis present

## 2020-06-19 DIAGNOSIS — Z20822 Contact with and (suspected) exposure to covid-19: Secondary | ICD-10-CM | POA: Diagnosis present

## 2020-06-19 DIAGNOSIS — I959 Hypotension, unspecified: Secondary | ICD-10-CM | POA: Diagnosis present

## 2020-06-19 DIAGNOSIS — S7292XA Unspecified fracture of left femur, initial encounter for closed fracture: Secondary | ICD-10-CM | POA: Diagnosis not present

## 2020-06-19 DIAGNOSIS — I517 Cardiomegaly: Secondary | ICD-10-CM | POA: Diagnosis not present

## 2020-06-19 DIAGNOSIS — K219 Gastro-esophageal reflux disease without esophagitis: Secondary | ICD-10-CM | POA: Diagnosis present

## 2020-06-19 DIAGNOSIS — S72302A Unspecified fracture of shaft of left femur, initial encounter for closed fracture: Secondary | ICD-10-CM | POA: Diagnosis not present

## 2020-06-19 DIAGNOSIS — Z888 Allergy status to other drugs, medicaments and biological substances status: Secondary | ICD-10-CM | POA: Diagnosis not present

## 2020-06-19 DIAGNOSIS — M1712 Unilateral primary osteoarthritis, left knee: Secondary | ICD-10-CM | POA: Diagnosis not present

## 2020-06-19 DIAGNOSIS — N189 Chronic kidney disease, unspecified: Secondary | ICD-10-CM | POA: Diagnosis not present

## 2020-06-19 DIAGNOSIS — Z9889 Other specified postprocedural states: Secondary | ICD-10-CM | POA: Diagnosis not present

## 2020-06-19 DIAGNOSIS — E872 Acidosis: Secondary | ICD-10-CM | POA: Diagnosis not present

## 2020-06-19 DIAGNOSIS — I5032 Chronic diastolic (congestive) heart failure: Secondary | ICD-10-CM | POA: Diagnosis not present

## 2020-06-19 DIAGNOSIS — M84452A Pathological fracture, left femur, initial encounter for fracture: Secondary | ICD-10-CM | POA: Diagnosis not present

## 2020-06-19 DIAGNOSIS — K449 Diaphragmatic hernia without obstruction or gangrene: Secondary | ICD-10-CM | POA: Diagnosis not present

## 2020-06-19 DIAGNOSIS — S72001F Fracture of unspecified part of neck of right femur, subsequent encounter for open fracture type IIIA, IIIB, or IIIC with routine healing: Secondary | ICD-10-CM | POA: Diagnosis not present

## 2020-06-19 DIAGNOSIS — R9431 Abnormal electrocardiogram [ECG] [EKG]: Secondary | ICD-10-CM | POA: Diagnosis not present

## 2020-06-19 DIAGNOSIS — J9601 Acute respiratory failure with hypoxia: Secondary | ICD-10-CM | POA: Diagnosis not present

## 2020-06-19 DIAGNOSIS — S22069A Unspecified fracture of T7-T8 vertebra, initial encounter for closed fracture: Secondary | ICD-10-CM | POA: Diagnosis not present

## 2020-06-19 DIAGNOSIS — Z66 Do not resuscitate: Secondary | ICD-10-CM | POA: Diagnosis not present

## 2020-06-19 DIAGNOSIS — M899 Disorder of bone, unspecified: Secondary | ICD-10-CM | POA: Diagnosis not present

## 2020-06-19 DIAGNOSIS — I110 Hypertensive heart disease with heart failure: Secondary | ICD-10-CM | POA: Diagnosis not present

## 2020-06-19 DIAGNOSIS — I4891 Unspecified atrial fibrillation: Secondary | ICD-10-CM | POA: Diagnosis not present

## 2020-06-19 DIAGNOSIS — Z823 Family history of stroke: Secondary | ICD-10-CM

## 2020-06-19 DIAGNOSIS — S2242XA Multiple fractures of ribs, left side, initial encounter for closed fracture: Secondary | ICD-10-CM | POA: Diagnosis not present

## 2020-06-19 LAB — CBC WITH DIFFERENTIAL/PLATELET
Abs Immature Granulocytes: 0.08 10*3/uL — ABNORMAL HIGH (ref 0.00–0.07)
Basophils Absolute: 0 10*3/uL (ref 0.0–0.1)
Basophils Relative: 0 %
Eosinophils Absolute: 0 10*3/uL (ref 0.0–0.5)
Eosinophils Relative: 0 %
HCT: 40.3 % (ref 36.0–46.0)
Hemoglobin: 13.3 g/dL (ref 12.0–15.0)
Immature Granulocytes: 1 %
Lymphocytes Relative: 5 %
Lymphs Abs: 0.7 10*3/uL (ref 0.7–4.0)
MCH: 31.4 pg (ref 26.0–34.0)
MCHC: 33 g/dL (ref 30.0–36.0)
MCV: 95 fL (ref 80.0–100.0)
Monocytes Absolute: 0.7 10*3/uL (ref 0.1–1.0)
Monocytes Relative: 5 %
Neutro Abs: 12.3 10*3/uL — ABNORMAL HIGH (ref 1.7–7.7)
Neutrophils Relative %: 89 %
Platelets: 164 10*3/uL (ref 150–400)
RBC: 4.24 MIL/uL (ref 3.87–5.11)
RDW: 14.5 % (ref 11.5–15.5)
WBC: 13.8 10*3/uL — ABNORMAL HIGH (ref 4.0–10.5)
nRBC: 0 % (ref 0.0–0.2)

## 2020-06-19 LAB — LACTIC ACID, PLASMA
Lactic Acid, Venous: 2.3 mmol/L (ref 0.5–1.9)
Lactic Acid, Venous: 1.1 mmol/L (ref 0.5–1.9)

## 2020-06-19 LAB — URINALYSIS, ROUTINE W REFLEX MICROSCOPIC
Bilirubin Urine: NEGATIVE
Glucose, UA: NEGATIVE mg/dL
Hgb urine dipstick: NEGATIVE
Ketones, ur: NEGATIVE mg/dL
Leukocytes,Ua: NEGATIVE
Nitrite: NEGATIVE
Protein, ur: NEGATIVE mg/dL
Specific Gravity, Urine: 1.018 (ref 1.005–1.030)
pH: 6 (ref 5.0–8.0)

## 2020-06-19 LAB — COMPREHENSIVE METABOLIC PANEL
ALT: 8 U/L (ref 0–44)
AST: 18 U/L (ref 15–41)
Albumin: 3 g/dL — ABNORMAL LOW (ref 3.5–5.0)
Alkaline Phosphatase: 233 U/L — ABNORMAL HIGH (ref 38–126)
Anion gap: 16 — ABNORMAL HIGH (ref 5–15)
BUN: 14 mg/dL (ref 8–23)
CO2: 29 mmol/L (ref 22–32)
Calcium: 8.8 mg/dL — ABNORMAL LOW (ref 8.9–10.3)
Chloride: 97 mmol/L — ABNORMAL LOW (ref 98–111)
Creatinine, Ser: 0.94 mg/dL (ref 0.44–1.00)
GFR, Estimated: 58 mL/min — ABNORMAL LOW (ref >60)
Glucose, Bld: 174 mg/dL — ABNORMAL HIGH (ref 70–99)
Potassium: 2.9 mmol/L — ABNORMAL LOW (ref 3.5–5.1)
Sodium: 142 mmol/L (ref 135–145)
Total Bilirubin: 1.3 mg/dL — ABNORMAL HIGH (ref 0.3–1.2)
Total Protein: 6.5 g/dL (ref 6.5–8.1)

## 2020-06-19 LAB — HEMOGLOBIN AND HEMATOCRIT, BLOOD
HCT: 33.9 % — ABNORMAL LOW (ref 36.0–46.0)
HCT: 34.8 % — ABNORMAL LOW (ref 36.0–46.0)
Hemoglobin: 10.9 g/dL — ABNORMAL LOW (ref 12.0–15.0)
Hemoglobin: 11.4 g/dL — ABNORMAL LOW (ref 12.0–15.0)

## 2020-06-19 LAB — HEMOGLOBIN A1C
Hgb A1c MFr Bld: 5.2 % (ref 4.8–5.6)
Mean Plasma Glucose: 102.54 mg/dL

## 2020-06-19 LAB — SARS CORONAVIRUS 2 (TAT 6-24 HRS): SARS Coronavirus 2: NEGATIVE

## 2020-06-19 LAB — GAMMA GT: GGT: 21 U/L (ref 7–50)

## 2020-06-19 LAB — MRSA PCR SCREENING: MRSA by PCR: NEGATIVE

## 2020-06-19 LAB — MAGNESIUM: Magnesium: 1.5 mg/dL — ABNORMAL LOW (ref 1.7–2.4)

## 2020-06-19 MED ORDER — POTASSIUM CHLORIDE 10 MEQ/100ML IV SOLN
10.0000 meq | INTRAVENOUS | Status: AC
  Administered 2020-06-19 (×4): 10 meq via INTRAVENOUS
  Filled 2020-06-19 (×4): qty 100

## 2020-06-19 MED ORDER — POLYVINYL ALCOHOL 1.4 % OP SOLN: 1.0000 [drp] | OPHTHALMIC | Status: DC | PRN

## 2020-06-19 MED ORDER — TRANEXAMIC ACID-NACL 1000-0.7 MG/100ML-% IV SOLN
1000.0000 mg | INTRAVENOUS | Status: AC
  Administered 2020-06-20: 1000 mg via INTRAVENOUS

## 2020-06-19 MED ORDER — SODIUM CHLORIDE 0.9 % IV SOLN: INTRAVENOUS | Status: DC

## 2020-06-19 MED ORDER — PANTOPRAZOLE SODIUM 40 MG PO TBEC
40.0000 mg | DELAYED_RELEASE_TABLET | Freq: Every morning | ORAL | Status: DC
  Administered 2020-06-20 – 2020-06-25 (×6): 40 mg via ORAL
  Filled 2020-06-19 (×6): qty 1

## 2020-06-19 MED ORDER — SODIUM CHLORIDE 0.9 % IV BOLUS
1000.0000 mL | Freq: Once | INTRAVENOUS | Status: AC
  Administered 2020-06-19: 1000 mL via INTRAVENOUS

## 2020-06-19 MED ORDER — POVIDONE-IODINE 10 % EX SWAB
2.0000 "application " | Freq: Once | CUTANEOUS | Status: AC
  Administered 2020-06-20: 2 via TOPICAL

## 2020-06-19 MED ORDER — HYDROCODONE-ACETAMINOPHEN 5-325 MG PO TABS
1.0000 | ORAL_TABLET | Freq: Four times a day (QID) | ORAL | Status: DC | PRN
  Administered 2020-06-19 – 2020-06-20 (×3): 1 via ORAL
  Filled 2020-06-19: qty 1
  Filled 2020-06-19: qty 2
  Filled 2020-06-19: qty 1

## 2020-06-19 MED ORDER — ALPRAZOLAM 0.5 MG PO TABS
0.5000 mg | ORAL_TABLET | Freq: Two times a day (BID) | ORAL | Status: DC | PRN
  Administered 2020-06-19 – 2020-06-24 (×7): 0.5 mg via ORAL
  Filled 2020-06-19 (×7): qty 1

## 2020-06-19 MED ORDER — SODIUM CHLORIDE 0.9 % IV BOLUS
500.0000 mL | Freq: Once | INTRAVENOUS | Status: AC
  Administered 2020-06-19: 500 mL via INTRAVENOUS

## 2020-06-19 MED ORDER — POLYETHYL GLYCOL-PROPYL GLYCOL 0.4-0.3 % OP SOLN: 2.0000 [drp] | Freq: Three times a day (TID) | OPHTHALMIC | Status: DC | PRN

## 2020-06-19 MED ORDER — MAGNESIUM SULFATE 2 GM/50ML IV SOLN
2.0000 g | Freq: Once | INTRAVENOUS | Status: AC
  Administered 2020-06-20: 2 g via INTRAVENOUS
  Filled 2020-06-19: qty 50

## 2020-06-19 MED ORDER — DOCUSATE SODIUM 100 MG PO CAPS
200.0000 mg | ORAL_CAPSULE | Freq: Every day | ORAL | Status: DC
  Administered 2020-06-20: 200 mg via ORAL
  Filled 2020-06-19: qty 2

## 2020-06-19 MED ORDER — CEFAZOLIN SODIUM-DEXTROSE 2-4 GM/100ML-% IV SOLN
2.0000 g | INTRAVENOUS | Status: AC
  Administered 2020-06-20: 2 g via INTRAVENOUS

## 2020-06-19 MED ORDER — CHLORHEXIDINE GLUCONATE 4 % EX LIQD
60.0000 mL | Freq: Once | CUTANEOUS | Status: AC
  Administered 2020-06-20: 4 via TOPICAL
  Filled 2020-06-19: qty 60

## 2020-06-19 NOTE — Progress Notes (Signed)
Made current RN Pat Kocher aware & oncoming RN [place] Cummings RN aware of Latic acid being 2.3 .

## 2020-06-19 NOTE — ED Provider Notes (Addendum)
[facility] DEPT Provider Note   CSN: [id number] Arrival date & time: 06/19/20  1056     History Chief Complaint  Patient presents with  . Leg Pain    Jacqueline [name] is a 87 y.o. female.  87 year old female with history of dementia from nursing home who presents with deformity to left thigh.  According to nursing staff, they are attempting to transfer patient when she pivoted and heard a pop at her left leg.  EMS was called and patient in severe pain and required 150 mcg of fentanyl.  Sent here for further management.  Due to patient's history of dementia, history is limited.  She is on Eliquis for A. fib        Past Medical History:  Diagnosis Date  . Allergy, unspecified not elsewhere classified   . Anemia, unspecified   . Anxiety state, unspecified   . Atrial fibrillation with rapid ventricular response ([place]) 03/13/12  . Blood in stool   . Complication of anesthesia    "hard to wake me up after colonoscopy" (03/13/12)  . Degeneration of lumbar or lumbosacral intervertebral disc   . GERD (gastroesophageal reflux disease)   . H/O hiatal hernia   . Hemorrhage of gastrointestinal tract, unspecified   . History of IBS   . Osteoarthrosis, unspecified whether generalized or localized, lower leg   . Other diseases of lung, not elsewhere classified   . Other dyspnea and respiratory abnormality   . Personal history of other diseases of digestive system   . Personal history of peptic ulcer disease   . Unspecified adverse effect of other drug, medicinal and biological substance(995.29)   . Unspecified cerebral artery occlusion with cerebral infarction 10/2008   "mini stroke" denies residual (03/13/12)  . Unspecified essential hypertension   . Unspecified paranoid state     Patient Active Problem List   Diagnosis Date Noted  . Weakness 12/27/2019  . Generalized weakness 08/05/2019  . Macrocytosis 08/05/2019  . Acute on chronic kidney failure ([place])  08/05/2019  . Acute respiratory failure with hypoxia ([place]) 12/24/2018  . Acute on chronic diastolic CHF (congestive heart failure) ([place]) 12/24/2018  . Dyspnea 12/24/2018  . Chronic knee pain 12/24/2018  . History of multiple cerebrovascular accidents (CVAs) 12/24/2018  . GERD (gastroesophageal reflux disease) 11/04/2017  . Altered mental status 11/03/2017  . CVA (cerebral vascular accident) ([place]) 10/25/2017  . Benign essential HTN   . History of TIA (transient ischemic attack)   . TIA (transient ischemic attack) 10/24/2017  . Cerebral infarction due to embolism of right middle cerebral artery ([place])   . HLD (hyperlipidemia)   . Acute ischemic right MCA stroke ([place]) 06/18/2014  . Facial droop   . Displaced fracture of shaft of left humerus 04/03/2014  . Right arm fracture 04/01/2014  . Displaced spiral fracture of shaft of humerus   . Peripheral arterial disease ([place]) 10/03/2013  . Chronic diastolic CHF (congestive heart failure) ([place]) 08/08/2013  . Chronic venous insufficiency 04/02/2013  . UTI (urinary tract infection) 01/13/2013  . Hypokalemia 01/13/2013  . PAF (paroxysmal atrial fibrillation) ([place]) 10/03/2012  . CAD (coronary artery disease) of artery bypass graft 04/05/2012  . Atrial fibrillation with RVR ([place]) 03/13/2012  . Need for prophylactic vaccination and inoculation against influenza 02/14/2012  . ABDOMINAL WALL HERNIA 01/21/2010  . PARESTHESIA 05/04/2009  . SKIN LESION 01/28/2009  . Nocturia 01/28/2009  . DYSPNEA 01/01/2009  . CVA (cerebral infarction) 11/17/2008  . ALLERGY 10/27/2008  . PAROXYSMAL NOCTURNAL DYSPNEA  10/03/2008  . Osteoarthrosis, unspecified whether generalized or localized, involving lower leg 09/03/2008  . RESTRICTIVE LUNG DISEASE 05/26/2008  . Symptomatic anemia 01/08/2008  . UNS ADVRS EFF OTH RX MEDICINAL&BIOLOGICAL SBSTNC 11/09/2007  . HEMATOCHEZIA 07/18/2007  . PARANOIA 06/01/2007  . Anxiety 01/23/2007  . Hypertensive heart disease with CHF  ([place]) 01/23/2007  . GI BLEEDING 01/23/2007  . Osteoarthritis 01/23/2007  . DEGENERATIVE DISC DISEASE, LUMBAR SPINE 01/23/2007  . History of peptic ulcer disease 01/23/2007  . Personal history of other diseases of digestive system 01/23/2007    Past Surgical History:  Procedure Laterality Date  . DILATION AND CURETTAGE OF UTERUS  1950's?  . ORIF HUMERUS FRACTURE Right 04/03/2014   Procedure: OPEN REDUCTION INTERNAL FIXATION (ORIF) RIGHT HUMERUS DISTAL FRACTURE;  Surgeon: Roseanne Kaufman, MD;  Location: [facility];  Service: Orthopedics;  Laterality: Right;  . PILONIDAL CYST / SINUS EXCISION  1957  . SHOULDER ARTHROSCOPY  ~ 2000   right     OB History   No obstetric history on file.     Family History  Problem Relation Age of Onset  . Diabetes Mother   . CVA Mother   . CVA Father   . CVA Maternal Grandmother     Social History   Tobacco Use  . Smoking status: Former Smoker    Packs/day: 1.00    Years: 40.00    Pack years: 40.00    Types: Cigarettes    Quit date: 05/02/2002    Years since quitting: 18.1  . Smokeless tobacco: Never Used  Substance Use Topics  . Alcohol use: No  . Drug use: No    Home Medications Prior to Admission medications   Medication Sig Start Date End Date Taking? Authorizing Provider  acetaminophen (TYLENOL) 500 MG tablet Take 500 mg by mouth every 8 (eight) hours as needed (for pain).    [provider]  acetaminophen (TYLENOL) 650 MG CR tablet Take 1,300 mg by mouth 2 (two) times daily.    [provider]  ALPRAZolam Duanne Moron) 0.5 MG tablet Take 1 tablet (0.5 mg total) by mouth 3 (three) times daily as needed for anxiety. 12/30/19   Raiford Noble Latif, DO  apixaban (ELIQUIS) 5 MG TABS tablet Take 1 tablet (5 mg total) by mouth 2 (two) times daily. 06/20/14   Regalado, Belkys A, MD  cholecalciferol (VITAMIN D3) 25 MCG (1000 UNIT) tablet Take 1,000 Units by mouth daily.    [provider]  diclofenac Sodium (VOLTAREN) 1 % GEL  Apply 2 g topically 4 (four) times daily. 12/30/19   Raiford Noble Latif, DO  diltiazem (CARDIZEM CD) 360 MG 24 hr capsule Take 360 mg by mouth daily.    [provider]  docusate sodium (COLACE) 100 MG capsule Take 200 mg by mouth daily.    [provider]  furosemide (LASIX) 20 MG tablet Take 1 tablet (20 mg total) by mouth daily. 12/31/19   Raiford Noble Latif, DO  iron polysaccharides (NIFEREX) 150 MG capsule Take 1 capsule (150 mg total) by mouth daily. 12/28/18   Rai, Vernelle Emerald, MD  metoprolol succinate (TOPROL-XL) 50 MG 24 hr tablet Take 50 mg by mouth in the morning.  06/28/19   [provider]  Multiple Vitamin (MULTIVITAMIN WITH MINERALS) TABS tablet Take 1 tablet by mouth daily.     [provider]  pantoprazole (PROTONIX) 40 MG tablet TAKE 1 TABLET EVERY MORNING Patient taking differently: Take 40 mg by mouth in the morning.  Hendricks Limes, MD  Polyethyl Glycol-Propyl Glycol (SYSTANE) 0.4-0.3 % SOLN Place 2 drops into both eyes every 8 (eight) hours as needed (for dry eyes).    [provider]  polyethylene glycol (MIRALAX / GLYCOLAX) 17 g packet Take 17 g by mouth daily as needed for mild constipation ([place]).    [provider]    Allergies    Metoclopramide hcl, Penicillins, Aspirin, and Zetia [ezetimibe]  Review of Systems   Review of Systems  Unable to perform ROS: Dementia    Physical Exam Updated Vital Signs BP (!) 105/55   Pulse 81   Temp 97.9 F (36.6 C)   Resp 20   SpO2 95%   Physical Exam Vitals and nursing note reviewed.  Constitutional:      General: She is not in acute distress.    Appearance: Normal appearance. She is well-developed and well-nourished. She is not toxic-appearing.  HENT:     Head: Normocephalic and atraumatic.  Eyes:     General: Lids are normal.     Extraocular Movements: EOM normal.     Conjunctiva/sclera: Conjunctivae normal.     Pupils: Pupils are equal, round, and  reactive to light.  Neck:     Thyroid: No thyroid mass.     Trachea: No tracheal deviation.  Cardiovascular:     Rate and Rhythm: Normal rate and regular rhythm.     Heart sounds: Normal heart sounds. No murmur heard. No gallop.   Pulmonary:     Effort: Pulmonary effort is normal. No respiratory distress.     Breath sounds: Normal breath sounds. No stridor. No decreased breath sounds, wheezing, rhonchi or rales.  Abdominal:     General: Bowel sounds are normal. There is no distension.     Palpations: Abdomen is soft.     Tenderness: There is no abdominal tenderness. There is no CVA tenderness or rebound.  Musculoskeletal:        General: No tenderness or edema. Normal range of motion.     Cervical back: Normal range of motion and neck supple.     Left lower leg: Swelling and deformity present.     Comments: Neurovascular intact at left foot  No evidence of injury to the patient's left knee or left foot or ankle.  Compartment at left thigh is soft  Skin:    General: Skin is warm and dry.     Findings: No abrasion or rash.  Neurological:     Mental Status: She is alert and oriented to person, place, and time.     GCS: GCS eye subscore is 4. GCS verbal subscore is 5. GCS motor subscore is 6.     Cranial Nerves: No cranial nerve deficit.     Sensory: No sensory deficit.     Deep Tendon Reflexes: Strength normal.  Psychiatric:        Mood and Affect: Mood and affect normal.        Speech: Speech normal.        Behavior: Behavior normal.     ED Results / Procedures / Treatments   Labs (all labs ordered are listed, but only abnormal results are displayed) Labs Reviewed  SARS CORONAVIRUS 2 (TAT 6-24 HRS)  CBC WITH DIFFERENTIAL/PLATELET  COMPREHENSIVE METABOLIC PANEL    EKG None  Radiology No results found.  Procedures Procedures   Medications Ordered in ED Medications  0.9 %  sodium chloride infusion (has no administration in time range)  ED Course  I have  reviewed the triage vital signs and the nursing notes.  Pertinent labs & imaging results that were available during my care of the patient were reviewed by me and considered in my medical decision making (see chart for details).    MDM Rules/Calculators/A&P                         Pt was hypotensive likely from large fentanyl dose given and BP responded well to fluids--HB stable Patient with fracture of femur as noted on x-ray.  No evidence of compartment syndrome at this time.  Was rechecked couple times in department does show evidence of likely hematoma formation as patient is on blood thinners.  Discussed with Dr. Lyla Glassing on-call orthopedics who is currently in the case and states that he will do surgery the next day and requests bucks traction and hospitalist admission.  Patient remains neurovascularly intact.  Will admit to the hospitalist service Final Clinical Impression(s) / ED Diagnoses Final diagnoses:  Cough  CRITICAL CARE Performed by: Leota Jacobsen Total critical care time: 55 minutes Critical care time was exclusive of separately billable procedures and treating other patients. Critical care was necessary to treat or prevent imminent or life-threatening deterioration. Critical care was time spent personally by me on the following activities: development of treatment plan with patient and/or surrogate as well as nursing, discussions with consultants, evaluation of patient's response to treatment, examination of patient, obtaining history from patient or surrogate, ordering and performing treatments and interventions, ordering and review of laboratory studies, ordering and review of radiographic studies, pulse oximetry and re-evaluation of patient's condition.  Rx / DC Orders ED Discharge Orders    None       Lacretia Leigh, MD 06/19/20 1713    Lacretia Leigh, MD 06/19/20 [phone]

## 2020-06-19 NOTE — ED Triage Notes (Addendum)
Per EMS, patient from [facility] senior living, deformed left femur. Staff reports foot got stuck during pivot and patient leg twisted. Take eliquis. Hx dementia. Alert to person. Baseline per staff.  20g RAC 143mcg Fentanyl with EMS

## 2020-06-19 NOTE — H&P (Signed)
History and Physical    Jacqueline [name] KXF:[id number] DOB: 31-May-1928 DOA: 06/19/2020  PCP: Lajean Manes, MD  Patient coming from: SNF  Chief Complaint: left thigh pain  HPI: Jacqueline [name] is a 87 y.o. female with medical history significant of a fib on eliquis, anxiety, HTN, dementia. Presenting with left hip pain. History from chart review and family at bedside as patient is a poor historian d/t dementia. The was at her SNF this morning and was assisted to the bathroom. As she was trying to transfer, she pivoted on her left left and heard a loud pop. She was then unable to place weight on it and she was in severe pain. EMS was called to bring the patient to the ED.  ED Course: Films showed foreshortened, displaced and angulated fracture of the mid left femoral shaft. Orthopedics was consulted. TRH was called for admission.   Review of Systems:  Denies CP, dyspnea, syncopal episodes, N/V/D/fever, ab pain. Review of systems is otherwise negative for all not mentioned in HPI.   PMHx Past Medical History:  Diagnosis Date  . Allergy, unspecified not elsewhere classified   . Anemia, unspecified   . Anxiety state, unspecified   . Atrial fibrillation with rapid ventricular response ([place]) 03/13/12  . Blood in stool   . Complication of anesthesia    "hard to wake me up after colonoscopy" (03/13/12)  . Degeneration of lumbar or lumbosacral intervertebral disc   . GERD (gastroesophageal reflux disease)   . H/O hiatal hernia   . Hemorrhage of gastrointestinal tract, unspecified   . History of IBS   . Osteoarthrosis, unspecified whether generalized or localized, lower leg   . Other diseases of lung, not elsewhere classified   . Other dyspnea and respiratory abnormality   . Personal history of other diseases of digestive system   . Personal history of peptic ulcer disease   . Unspecified adverse effect of other drug, medicinal and biological substance(995.29)   . Unspecified cerebral artery  occlusion with cerebral infarction 10/2008   "mini stroke" denies residual (03/13/12)  . Unspecified essential hypertension   . Unspecified paranoid state     PSHx Past Surgical History:  Procedure Laterality Date  . DILATION AND CURETTAGE OF UTERUS  1950's?  . ORIF HUMERUS FRACTURE Right 04/03/2014   Procedure: OPEN REDUCTION INTERNAL FIXATION (ORIF) RIGHT HUMERUS DISTAL FRACTURE;  Surgeon: Roseanne Kaufman, MD;  Location: [facility];  Service: Orthopedics;  Laterality: Right;  . PILONIDAL CYST / SINUS EXCISION  1957  . SHOULDER ARTHROSCOPY  ~ 2000   right    SocHx  reports that she quit smoking about 18 years ago. Her smoking use included cigarettes. She has a 40.00 pack-year smoking history. She has never used smokeless tobacco. She reports that she does not drink alcohol and does not use drugs.  Allergies  Allergen Reactions  . Metoclopramide Hcl Other (See Comments)     Tremors  . Penicillins Itching and Other (See Comments)    "Allergic," per [facility] Has patient had a PCN reaction causing immediate rash, facial/tongue/throat swelling, SOB or lightheadedness with hypotension: Yes Has patient had a PCN reaction causing severe rash involving mucus membranes or skin necrosis: Yes Has patient had a PCN reaction that required hospitalization:No Has patient had a PCN reaction occurring within the last 10 years: No If all of the above answers are "NO", then may proceed with Cephalosporin use.   . Aspirin Other (See Comments)    "gave me stomach aches; made me bleed" (  03/13/12)- "Allergic," per [facility]  . Zetia [Ezetimibe] Other (See Comments)    Stomach and back pain    FamHx Family History  Problem Relation Age of Onset  . Diabetes Mother   . CVA Mother   . CVA Father   . CVA Maternal Grandmother     Prior to Admission medications   Medication Sig Start Date End Date Taking? Authorizing Provider  acetaminophen (TYLENOL) 500 MG tablet Take 1,000 mg by mouth every 8 (eight) hours as  needed for mild pain or fever (for pain).   Yes [provider]  ALPRAZolam (XANAX) 0.5 MG tablet Take 1 tablet (0.5 mg total) by mouth 3 (three) times daily as needed for anxiety. Patient taking differently: Take 0.5 mg by mouth 2 (two) times daily. 12/30/19  Yes Sheikh, Omair Latif, DO  apixaban (ELIQUIS) 5 MG TABS tablet Take 1 tablet (5 mg total) by mouth 2 (two) times daily. 06/20/14  Yes Regalado, Belkys A, MD  cholecalciferol (VITAMIN D3) 25 MCG (1000 UNIT) tablet Take 1,000 Units by mouth daily.   Yes [provider]  diclofenac Sodium (VOLTAREN) 1 % GEL Apply 2 g topically 4 (four) times daily. 12/30/19  Yes Sheikh, Omair Latif, DO  diltiazem (CARDIZEM CD) 360 MG 24 hr capsule Take 360 mg by mouth daily.   Yes [provider]  docusate sodium (COLACE) 100 MG capsule Take 200 mg by mouth daily.   Yes [provider]  furosemide (LASIX) 20 MG tablet Take 1 tablet (20 mg total) by mouth daily. 12/31/19  Yes Sheikh, Omair Latif, DO  iron polysaccharides (NIFEREX) 150 MG capsule Take 1 capsule (150 mg total) by mouth daily. 12/28/18  Yes Rai, Ripudeep K, MD  metoprolol succinate (TOPROL-XL) 50 MG 24 hr tablet Take 50 mg by mouth in the morning.  06/28/19  Yes [provider]  pantoprazole (PROTONIX) 40 MG tablet TAKE 1 TABLET EVERY MORNING Patient taking differently: Take 40 mg by mouth in the morning.   Yes Hendricks Limes, MD  Polyethyl Glycol-Propyl Glycol (SYSTANE) 0.4-0.3 % SOLN Place 2 drops into both eyes every 8 (eight) hours as needed (for dry eyes).   Yes [provider]  polyethylene glycol (MIRALAX / GLYCOLAX) 17 g packet Take 17 g by mouth daily as needed for mild constipation ([place]).   Yes [provider]    Physical Exam: Vitals:   06/19/20 1245 06/19/20 1300 06/19/20 1330 06/19/20 1345  BP: 122/61 (!) 101/54 (!) 105/51 (!) 96/53  Pulse: 78 78 73 68  Resp: 15 (!) _0 Temp:      SpO2: 91% 90% 94% 98%     General: 87 y.o. female resting in bed in NAD Eyes: PERRL, normal sclera ENMT: Nares patent w/o discharge, orophaynx clear, dentition normal, ears w/o discharge/lesions/ulcers Neck: Supple, trachea midline Cardiovascular: RRR, +S1, S2, no m/g/r, equal pulses throughout Respiratory: CTABL, no w/r/r, normal WOB on 2L [place] GI: BS+, NDNT, no masses noted, no organomegaly noted MSK: No e/c/c; limited ROM left hip d/t pain Neuro: A&O x 3 (person, place, president), no focal deficits Psyc: Appropriate interaction and affect, calm/cooperative  Labs on Admission: I have personally reviewed following labs and imaging studies  CBC: Recent Labs  Lab 06/19/20 1154  WBC 13.8*  NEUTROABS 12.3*  HGB 13.3  HCT 40.3  MCV 95.0  PLT 259   Basic Metabolic Panel: Recent Labs  Lab 06/19/20 1154  NA 142  K 2.9*  CL 97*  CO2 29  GLUCOSE 174*  BUN 14  CREATININE 0.94  CALCIUM 8.8*   GFR: CrCl cannot be calculated (Unknown ideal weight.). Liver Function Tests: Recent Labs  Lab 06/19/20 1154  AST 18  ALT 8  ALKPHOS 233*  BILITOT 1.3*  PROT 6.5  ALBUMIN 3.0*   No results for input(s): LIPASE, AMYLASE in the last 168 hours. No results for input(s): AMMONIA in the last 168 hours. Coagulation Profile: No results for input(s): INR, PROTIME in the last 168 hours. Cardiac Enzymes: No results for input(s): CKTOTAL, CKMB, CKMBINDEX, TROPONINI in the last 168 hours. BNP (last 3 results) No results for input(s): PROBNP in the last 8760 hours. HbA1C: No results for input(s): HGBA1C in the last 72 hours. CBG: No results for input(s): GLUCAP in the last 168 hours. Lipid Profile: No results for input(s): CHOL, HDL, LDLCALC, TRIG, CHOLHDL, LDLDIRECT in the last 72 hours. Thyroid Function Tests: No results for input(s): TSH, T4TOTAL, FREET4, T3FREE, THYROIDAB in the last 72 hours. Anemia Panel: No results for input(s): VITAMINB12, FOLATE, FERRITIN, TIBC, IRON, RETICCTPCT in the last 72  hours. Urine analysis:    Component Value Date/Time   COLORURINE YELLOW 12/27/2019 [place] 12/27/2019 1633   LABSPEC 1.017 12/27/2019 1633   PHURINE 5.0 12/27/2019 1633   GLUCOSEU NEGATIVE 12/27/2019 1633   HGBUR NEGATIVE 12/27/2019 1633   BILIRUBINUR NEGATIVE 12/27/2019 1633   KETONESUR NEGATIVE 12/27/2019 1633   PROTEINUR NEGATIVE 12/27/2019 1633   UROBILINOGEN 0.2 06/18/2014 1950   NITRITE NEGATIVE 12/27/2019 1633   LEUKOCYTESUR TRACE (A) 12/27/2019 1633    Radiological Exams on Admission: DG Chest 1 View  Result Date: 06/19/2020 CLINICAL DATA:  Fall EXAM: CHEST  1 VIEW COMPARISON:  December 29, 2019 FINDINGS: Evaluation is limited secondary to patient rotation. Revisualization of cardiomegaly. Triangular opacity along the RIGHT perihilar border is similar in comparison to prior when accounting for differences in patient rotation. Elevation of the RIGHT hemidiaphragm, unchanged. Atherosclerotic calcifications of the aorta. Large hiatal hernia. Reticulonodular opacities of the RIGHT lung periphery. Hazy opacity overlying the LEFT upper lung, likely due to skin fold given persistence of lung markings distal to this opacity. No definitive pneumothorax or pleural effusion. Lucency traversing the proximal RIGHT humerus. Advanced degenerative changes of the RIGHT humerus. Status post ORIF of the distal RIGHT humerus. Degenerative changes of the thoracic spine. IMPRESSION: 1. Lucency traversing the proximal RIGHT humerus may reflect a nondisplaced fracture. Consider additional dedicated radiographs for further evaluation. 2. Similar appearance of enlarged cardiomediastinal silhouette although evaluation is limited secondary to patient rotation. Mildly increased reticulonodular opacities of the RIGHT lung periphery likely reflecting aspiration or atelectasis. Superimposed infection could present similarly. Electronically Signed   By: Valentino Saxon MD   On: 06/19/2020 12:37   DG  Humerus Right  Result Date: 06/19/2020 CLINICAL DATA:  Fall today. EXAM: RIGHT HUMERUS - 2+ VIEW COMPARISON:  None. FINDINGS: Status post surgical internal fixation of old distal right humeral fracture. Severe degenerative changes seen involving the right glenohumeral joint. No acute fracture or dislocation is noted. No soft tissue abnormality is noted. IMPRESSION: Status post surgical internal fixation of old distal right humeral fracture. Severe degenerative joint disease of the right glenohumeral joint. No acute fracture or dislocation is noted. Electronically Signed   By: Marijo Conception M.D.   On: 06/19/2020 13:12   DG Hip Unilat W or Wo Pelvis 2-3 Views Left  Result Date: 06/19/2020 CLINICAL DATA:  Fall EXAM: DG HIP (WITH OR  WITHOUT PELVIS) 2-3V LEFT; LEFT FEMUR 2 VIEWS COMPARISON:  None. FINDINGS: Evaluation is limited secondary to patient rotation. Osteopenia. There is a foreshortened fracture of the mid femoral shaft. Distal fragment is displaced medially by 1.5 shaft widths. It is foreshortened by approximately 6 cm. There is apex ventral angulation. There is limited assessment of the LEFT hip joint due to patient rotation. Femoral head appears to be located within the acetabulum. Overlying bowel gas limits evaluation of the sacrum. Degenerative changes of the lower lumbar spine. Vascular calcifications. IMPRESSION: Foreshortened, displaced and angulated fracture of the mid left femoral shaft. If persistent concern for nondisplaced hip or pelvic fracture, recommend dedicated pelvic MRI or CT. Electronically Signed   By: Valentino Saxon MD   On: 06/19/2020 12:45   DG Femur Min 2 Views Left  Result Date: 06/19/2020 CLINICAL DATA:  Fall EXAM: DG HIP (WITH OR WITHOUT PELVIS) 2-3V LEFT; LEFT FEMUR 2 VIEWS COMPARISON:  None. FINDINGS: Evaluation is limited secondary to patient rotation. Osteopenia. There is a foreshortened fracture of the mid femoral shaft. Distal fragment is displaced medially by  1.5 shaft widths. It is foreshortened by approximately 6 cm. There is apex ventral angulation. There is limited assessment of the LEFT hip joint due to patient rotation. Femoral head appears to be located within the acetabulum. Overlying bowel gas limits evaluation of the sacrum. Degenerative changes of the lower lumbar spine. Vascular calcifications. IMPRESSION: Foreshortened, displaced and angulated fracture of the mid left femoral shaft. If persistent concern for nondisplaced hip or pelvic fracture, recommend dedicated pelvic MRI or CT. Electronically Signed   By: Valentino Saxon MD   On: 06/19/2020 12:45    EKG: Independently reviewed. Sinus, no st elevations  Assessment/Plan Left femur fracture     - admit to inpt, progressive     - ortho consulted, plan is for surgery in the AM     - continue traction  Hypotension     - she got a decent amount of fentanyl; but this should be wearing off.     - be careful with narcotic pain control     - she has an elevated WBC that is likely reactive, but will check UA and lactic acid     - continue fluid boluses as tolerated     - let's place her in progressive for now     - follow her H&H  Afib Hx of HTN     - holding BP meds for now d/t hypotension     - hold eliquis for surgery  Elevated alk phos     - check ggt     - denies ab pain   Hypokalemia     - replace K+; check Mg2+  Hyperglycemia     - check A1c, no history of DM  DVT prophylaxis: SCD  Code Status: DNR, confirmed with pt/family at bedside  Family Communication: w/ son at bedside  Consults called: EDP spoke with Orthopedics Social worker).   Status is: Inpatient  Remains inpatient appropriate because:Inpatient level of care appropriate due to severity of illness   Dispo: The patient is from: SNF              Anticipated d/c is to: SNF              Anticipated d/c date is: 3 days              Patient currently is not medically stable to d/c.   Difficult to place patient  No  Goodwin Kamphaus A Arionne Iams DO [facility]  If 7PM-7AM, please contact night-coverage [email or web address]  06/19/2020, 2:14 PM

## 2020-06-19 NOTE — ED Notes (Signed)
Admitting provider at bedside.

## 2020-06-19 NOTE — Plan of Care (Signed)

## 2020-06-19 NOTE — Progress Notes (Signed)
Received call from the lab Latic acid 2.3. Will make primary nurse aware.

## 2020-06-19 NOTE — H&P (View-Only) (Signed)
ORTHOPAEDIC CONSULTATION  REQUESTING PHYSICIAN: Jonnie Finner, DO  PCP:  Lajean Manes, MD  Chief Complaint: Left hip pain  HPI: Jacqueline [name] is a 87 y.o. female who complains of left hip pain secondary to a fall. Patient has past medical history significant for a-fib on eliquis, anxiety, HTN, and dementia. Patient fell from a wheelchair at her senior living facility and experienced pain in the left hip. She was transported by EMS to the ED where imaging revealed a displaced, angulated fracture of the left femur. Orthopedic surgery was consulted  Past Medical History:  Diagnosis Date  . Allergy, unspecified not elsewhere classified   . Anemia, unspecified   . Anxiety state, unspecified   . Atrial fibrillation with rapid ventricular response ([place]) 03/13/12  . Blood in stool   . Complication of anesthesia    "hard to wake me up after colonoscopy" (03/13/12)  . Degeneration of lumbar or lumbosacral intervertebral disc   . GERD (gastroesophageal reflux disease)   . H/O hiatal hernia   . Hemorrhage of gastrointestinal tract, unspecified   . History of IBS   . Osteoarthrosis, unspecified whether generalized or localized, lower leg   . Other diseases of lung, not elsewhere classified   . Other dyspnea and respiratory abnormality   . Personal history of other diseases of digestive system   . Personal history of peptic ulcer disease   . Unspecified adverse effect of other drug, medicinal and biological substance(995.29)   . Unspecified cerebral artery occlusion with cerebral infarction 10/2008   "mini stroke" denies residual (03/13/12)  . Unspecified essential hypertension   . Unspecified paranoid state    Past Surgical History:  Procedure Laterality Date  . DILATION AND CURETTAGE OF UTERUS  1950's?  . ORIF HUMERUS FRACTURE Right 04/03/2014   Procedure: OPEN REDUCTION INTERNAL FIXATION (ORIF) RIGHT HUMERUS DISTAL FRACTURE;  Surgeon: Roseanne Kaufman, MD;  Location: [facility];  Service:  Orthopedics;  Laterality: Right;  . PILONIDAL CYST / SINUS EXCISION  1957  . SHOULDER ARTHROSCOPY  ~ 2000   right   Social History   Socioeconomic History  . Marital status: Widowed    Spouse name: Not on file  . Number of children: 4  . Years of education: 51  . Highest education level: Not on file  Occupational History  . Occupation: retired    Fish farm manager: RETIRED  Tobacco Use  . Smoking status: Former Smoker    Packs/day: 1.00    Years: 40.00    Pack years: 40.00    Types: Cigarettes    Quit date: 05/02/2002    Years since quitting: 18.1  . Smokeless tobacco: Never Used  Substance and Sexual Activity  . Alcohol use: No  . Drug use: No  . Sexual activity: Never  Other Topics Concern  . Not on file  Social History Narrative   HSG. Married: 51- 8 yrs/ divorced; married 1960- 1988/ widowed. 4 grandchildren. 2 daughters- 1950, 1962; 2 sons- 1964, 1965. Lives alone- independent ADLs- pet cockatiel.   Social Determinants of Health   Financial Resource Strain: Not on file  Food Insecurity: Not on file  Transportation Needs: Not on file  Physical Activity: Not on file  Stress: Not on file  Social Connections: Not on file   Family History  Problem Relation Age of Onset  . Diabetes Mother   . CVA Mother   . CVA Father   . CVA Maternal Grandmother    Allergies  Allergen Reactions  . Metoclopramide  Hcl Other (See Comments)     Tremors  . Penicillins Itching and Other (See Comments)    "Allergic," per [facility] Has patient had a PCN reaction causing immediate rash, facial/tongue/throat swelling, SOB or lightheadedness with hypotension: Yes Has patient had a PCN reaction causing severe rash involving mucus membranes or skin necrosis: Yes Has patient had a PCN reaction that required hospitalization:No Has patient had a PCN reaction occurring within the last 10 years: No If all of the above answers are "NO", then may proceed with Cephalosporin use.   . Aspirin Other (See  Comments)    "gave me stomach aches; made me bleed" (03/13/12)- "Allergic," per MAR  . Zetia [Ezetimibe] Other (See Comments)    Stomach and back pain   Prior to Admission medications   Medication Sig Start Date End Date Taking? Authorizing Provider  acetaminophen (TYLENOL) 500 MG tablet Take 1,000 mg by mouth every 8 (eight) hours as needed for mild pain or fever (for pain).   Yes [provider]  ALPRAZolam (XANAX) 0.5 MG tablet Take 1 tablet (0.5 mg total) by mouth 3 (three) times daily as needed for anxiety. Patient taking differently: Take 0.5 mg by mouth 2 (two) times daily. 12/30/19  Yes Sheikh, Omair Latif, DO  apixaban (ELIQUIS) 5 MG TABS tablet Take 1 tablet (5 mg total) by mouth 2 (two) times daily. 06/20/14  Yes Regalado, Belkys A, MD  cholecalciferol (VITAMIN D3) 25 MCG (1000 UNIT) tablet Take 1,000 Units by mouth daily.   Yes [provider]  diclofenac Sodium (VOLTAREN) 1 % GEL Apply 2 g topically 4 (four) times daily. 12/30/19  Yes Sheikh, Omair Latif, DO  diltiazem (CARDIZEM CD) 360 MG 24 hr capsule Take 360 mg by mouth daily.   Yes [provider]  docusate sodium (COLACE) 100 MG capsule Take 200 mg by mouth daily.   Yes [provider]  furosemide (LASIX) 20 MG tablet Take 1 tablet (20 mg total) by mouth daily. 12/31/19  Yes Sheikh, Omair Latif, DO  iron polysaccharides (NIFEREX) 150 MG capsule Take 1 capsule (150 mg total) by mouth daily. 12/28/18  Yes Rai, Ripudeep K, MD  metoprolol succinate (TOPROL-XL) 50 MG 24 hr tablet Take 50 mg by mouth in the morning.  06/28/19  Yes [provider]  pantoprazole (PROTONIX) 40 MG tablet TAKE 1 TABLET EVERY MORNING Patient taking differently: Take 40 mg by mouth in the morning.   Yes Hendricks Limes, MD  Polyethyl Glycol-Propyl Glycol (SYSTANE) 0.4-0.3 % SOLN Place 2 drops into both eyes every 8 (eight) hours as needed (for dry eyes).   Yes [provider]  polyethylene glycol (MIRALAX /  GLYCOLAX) 17 g packet Take 17 g by mouth daily as needed for mild constipation ([place]).   Yes [provider]   DG Chest 1 View  Result Date: 06/19/2020 CLINICAL DATA:  Fall EXAM: CHEST  1 VIEW COMPARISON:  December 29, 2019 FINDINGS: Evaluation is limited secondary to patient rotation. Revisualization of cardiomegaly. Triangular opacity along the RIGHT perihilar border is similar in comparison to prior when accounting for differences in patient rotation. Elevation of the RIGHT hemidiaphragm, unchanged. Atherosclerotic calcifications of the aorta. Large hiatal hernia. Reticulonodular opacities of the RIGHT lung periphery. Hazy opacity overlying the LEFT upper lung, likely due to skin fold given persistence of lung markings distal to this opacity. No definitive pneumothorax or pleural effusion. Lucency traversing the proximal RIGHT humerus. Advanced degenerative changes of the RIGHT humerus. Status post  ORIF of the distal RIGHT humerus. Degenerative changes of the thoracic spine. IMPRESSION: 1. Lucency traversing the proximal RIGHT humerus may reflect a nondisplaced fracture. Consider additional dedicated radiographs for further evaluation. 2. Similar appearance of enlarged cardiomediastinal silhouette although evaluation is limited secondary to patient rotation. Mildly increased reticulonodular opacities of the RIGHT lung periphery likely reflecting aspiration or atelectasis. Superimposed infection could present similarly. Electronically Signed   By: Valentino Saxon MD   On: 06/19/2020 12:37   DG Humerus Right  Result Date: 06/19/2020 CLINICAL DATA:  Fall today. EXAM: RIGHT HUMERUS - 2+ VIEW COMPARISON:  None. FINDINGS: Status post surgical internal fixation of old distal right humeral fracture. Severe degenerative changes seen involving the right glenohumeral joint. No acute fracture or dislocation is noted. No soft tissue abnormality is noted. IMPRESSION: Status post surgical internal  fixation of old distal right humeral fracture. Severe degenerative joint disease of the right glenohumeral joint. No acute fracture or dislocation is noted. Electronically Signed   By: Marijo Conception M.D.   On: 06/19/2020 13:12   DG Hip Unilat W or Wo Pelvis 2-3 Views Left  Result Date: 06/19/2020 CLINICAL DATA:  Fall EXAM: DG HIP (WITH OR WITHOUT PELVIS) 2-3V LEFT; LEFT FEMUR 2 VIEWS COMPARISON:  None. FINDINGS: Evaluation is limited secondary to patient rotation. Osteopenia. There is a foreshortened fracture of the mid femoral shaft. Distal fragment is displaced medially by 1.5 shaft widths. It is foreshortened by approximately 6 cm. There is apex ventral angulation. There is limited assessment of the LEFT hip joint due to patient rotation. Femoral head appears to be located within the acetabulum. Overlying bowel gas limits evaluation of the sacrum. Degenerative changes of the lower lumbar spine. Vascular calcifications. IMPRESSION: Foreshortened, displaced and angulated fracture of the mid left femoral shaft. If persistent concern for nondisplaced hip or pelvic fracture, recommend dedicated pelvic MRI or CT. Electronically Signed   By: Valentino Saxon MD   On: 06/19/2020 12:45   DG Femur Min 2 Views Left  Result Date: 06/19/2020 CLINICAL DATA:  Fall EXAM: DG HIP (WITH OR WITHOUT PELVIS) 2-3V LEFT; LEFT FEMUR 2 VIEWS COMPARISON:  None. FINDINGS: Evaluation is limited secondary to patient rotation. Osteopenia. There is a foreshortened fracture of the mid femoral shaft. Distal fragment is displaced medially by 1.5 shaft widths. It is foreshortened by approximately 6 cm. There is apex ventral angulation. There is limited assessment of the LEFT hip joint due to patient rotation. Femoral head appears to be located within the acetabulum. Overlying bowel gas limits evaluation of the sacrum. Degenerative changes of the lower lumbar spine. Vascular calcifications. IMPRESSION: Foreshortened, displaced and  angulated fracture of the mid left femoral shaft. If persistent concern for nondisplaced hip or pelvic fracture, recommend dedicated pelvic MRI or CT. Electronically Signed   By: Valentino Saxon MD   On: 06/19/2020 12:45    Positive ROS: Unable to obtain due to dementia  Physical Exam: General: Alert, no acute distress Cardiovascular: No pedal edema Respiratory: No cyanosis, no use of accessory musculature GI: No organomegaly, abdomen is soft and non-tender Skin: No lesions in the area of chief complaint Neurologic: Sensation intact distally Psychiatric: Patient is competent for consent with normal mood and affect Lymphatic: No axillary or cervical lymphadenopathy  MUSCULOSKELETAL: Swelling and an angulated deformity noted LLE. Pedal pulses are present. Patient is able to dorsiflex and plantar felx  Assessment: Foreshortened, displaced, and angulated fracture of the mid left femoral shaft  Plan: Left femur fracture: Hospitalist admission.  Plan for surgery for intramedullary nail with Dr.Aina Rossbach Saturday morning. Hold chemical DVT prophylaxis, NPO after midnight, bed rest, Bucks traction for comfort   Dorothyann Peng, [place] [phone]    06/19/2020 3:21 PM    ADDENDUM: I reviewed her x-rays which were worrisome for pathologic fx. CT femur was then ordered, suggesting pathologic fx. CT CAP was then obtained showing widespread metastatic dz. Will have pathology available for frozen section intraop today during IM nail. Will need oncology work up.  Bertram Savin, MD 06/20/20

## 2020-06-19 NOTE — Progress Notes (Signed)
Orthopedic Tech Progress Note Patient Details:  Jacqueline [name] 1928/05/12 [id number]  Patient ID: [name], female   DOB: 11/15/1928, 87 y.o.   MRN: [id number]   Kennis Carina 06/19/2020, 5:51 PM 5lbs bucks traction applied to left leg

## 2020-06-19 NOTE — Consult Note (Addendum)
ORTHOPAEDIC CONSULTATION  REQUESTING PHYSICIAN: Jonnie Finner, DO  PCP:  Lajean Manes, MD  Chief Complaint: Left hip pain  HPI: Jacqueline [name] is a 87 y.o. female who complains of left hip pain secondary to a fall. Patient has past medical history significant for a-fib on eliquis, anxiety, HTN, and dementia. Patient fell from a wheelchair at her senior living facility and experienced pain in the left hip. She was transported by EMS to the ED where imaging revealed a displaced, angulated fracture of the left femur. Orthopedic surgery was consulted  Past Medical History:  Diagnosis Date  . Allergy, unspecified not elsewhere classified   . Anemia, unspecified   . Anxiety state, unspecified   . Atrial fibrillation with rapid ventricular response ([place]) 03/13/12  . Blood in stool   . Complication of anesthesia    "hard to wake me up after colonoscopy" (03/13/12)  . Degeneration of lumbar or lumbosacral intervertebral disc   . GERD (gastroesophageal reflux disease)   . H/O hiatal hernia   . Hemorrhage of gastrointestinal tract, unspecified   . History of IBS   . Osteoarthrosis, unspecified whether generalized or localized, lower leg   . Other diseases of lung, not elsewhere classified   . Other dyspnea and respiratory abnormality   . Personal history of other diseases of digestive system   . Personal history of peptic ulcer disease   . Unspecified adverse effect of other drug, medicinal and biological substance(995.29)   . Unspecified cerebral artery occlusion with cerebral infarction 10/2008   "mini stroke" denies residual (03/13/12)  . Unspecified essential hypertension   . Unspecified paranoid state    Past Surgical History:  Procedure Laterality Date  . DILATION AND CURETTAGE OF UTERUS  1950's?  . ORIF HUMERUS FRACTURE Right 04/03/2014   Procedure: OPEN REDUCTION INTERNAL FIXATION (ORIF) RIGHT HUMERUS DISTAL FRACTURE;  Surgeon: Roseanne Kaufman, MD;  Location: [facility];  Service:  Orthopedics;  Laterality: Right;  . PILONIDAL CYST / SINUS EXCISION  1957  . SHOULDER ARTHROSCOPY  ~ 2000   right   Social History   Socioeconomic History  . Marital status: Widowed    Spouse name: Not on file  . Number of children: 4  . Years of education: 9  . Highest education level: Not on file  Occupational History  . Occupation: retired    Fish farm manager: RETIRED  Tobacco Use  . Smoking status: Former Smoker    Packs/day: 1.00    Years: 40.00    Pack years: 40.00    Types: Cigarettes    Quit date: 05/02/2002    Years since quitting: 18.1  . Smokeless tobacco: Never Used  Substance and Sexual Activity  . Alcohol use: No  . Drug use: No  . Sexual activity: Never  Other Topics Concern  . Not on file  Social History Narrative   HSG. Married: 62- 8 yrs/ divorced; married 1960- 1988/ widowed. 4 grandchildren. 2 daughters- 1950, 1962; 2 sons- 1964, 1965. Lives alone- independent ADLs- pet cockatiel.   Social Determinants of Health   Financial Resource Strain: Not on file  Food Insecurity: Not on file  Transportation Needs: Not on file  Physical Activity: Not on file  Stress: Not on file  Social Connections: Not on file   Family History  Problem Relation Age of Onset  . Diabetes Mother   . CVA Mother   . CVA Father   . CVA Maternal Grandmother    Allergies  Allergen Reactions  . Metoclopramide  Hcl Other (See Comments)     Tremors  . Penicillins Itching and Other (See Comments)    "Allergic," per [facility] Has patient had a PCN reaction causing immediate rash, facial/tongue/throat swelling, SOB or lightheadedness with hypotension: Yes Has patient had a PCN reaction causing severe rash involving mucus membranes or skin necrosis: Yes Has patient had a PCN reaction that required hospitalization:No Has patient had a PCN reaction occurring within the last 10 years: No If all of the above answers are "NO", then may proceed with Cephalosporin use.   . Aspirin Other (See  Comments)    "gave me stomach aches; made me bleed" (03/13/12)- "Allergic," per [facility]  . Zetia [Ezetimibe] Other (See Comments)    Stomach and back pain   Prior to Admission medications   Medication Sig Start Date End Date Taking? Authorizing Provider  acetaminophen (TYLENOL) 500 MG tablet Take 1,000 mg by mouth every 8 (eight) hours as needed for mild pain or fever (for pain).   Yes [provider]  ALPRAZolam (XANAX) 0.5 MG tablet Take 1 tablet (0.5 mg total) by mouth 3 (three) times daily as needed for anxiety. Patient taking differently: Take 0.5 mg by mouth 2 (two) times daily. 12/30/19  Yes Sheikh, Omair Latif, DO  apixaban (ELIQUIS) 5 MG TABS tablet Take 1 tablet (5 mg total) by mouth 2 (two) times daily. 06/20/14  Yes Regalado, Belkys A, MD  cholecalciferol (VITAMIN D3) 25 MCG (1000 UNIT) tablet Take 1,000 Units by mouth daily.   Yes [provider]  diclofenac Sodium (VOLTAREN) 1 % GEL Apply 2 g topically 4 (four) times daily. 12/30/19  Yes Sheikh, Omair Latif, DO  diltiazem (CARDIZEM CD) 360 MG 24 hr capsule Take 360 mg by mouth daily.   Yes [provider]  docusate sodium (COLACE) 100 MG capsule Take 200 mg by mouth daily.   Yes [provider]  furosemide (LASIX) 20 MG tablet Take 1 tablet (20 mg total) by mouth daily. 12/31/19  Yes Sheikh, Omair Latif, DO  iron polysaccharides (NIFEREX) 150 MG capsule Take 1 capsule (150 mg total) by mouth daily. 12/28/18  Yes Rai, Ripudeep K, MD  metoprolol succinate (TOPROL-XL) 50 MG 24 hr tablet Take 50 mg by mouth in the morning.  06/28/19  Yes [provider]  pantoprazole (PROTONIX) 40 MG tablet TAKE 1 TABLET EVERY MORNING Patient taking differently: Take 40 mg by mouth in the morning.   Yes Hendricks Limes, MD  Polyethyl Glycol-Propyl Glycol (SYSTANE) 0.4-0.3 % SOLN Place 2 drops into both eyes every 8 (eight) hours as needed (for dry eyes).   Yes [provider]  polyethylene glycol (MIRALAX /  GLYCOLAX) 17 g packet Take 17 g by mouth daily as needed for mild constipation ([place]).   Yes [provider]   DG Chest 1 View  Result Date: 06/19/2020 CLINICAL DATA:  Fall EXAM: CHEST  1 VIEW COMPARISON:  December 29, 2019 FINDINGS: Evaluation is limited secondary to patient rotation. Revisualization of cardiomegaly. Triangular opacity along the RIGHT perihilar border is similar in comparison to prior when accounting for differences in patient rotation. Elevation of the RIGHT hemidiaphragm, unchanged. Atherosclerotic calcifications of the aorta. Large hiatal hernia. Reticulonodular opacities of the RIGHT lung periphery. Hazy opacity overlying the LEFT upper lung, likely due to skin fold given persistence of lung markings distal to this opacity. No definitive pneumothorax or pleural effusion. Lucency traversing the proximal RIGHT humerus. Advanced degenerative changes of the RIGHT humerus. Status post  ORIF of the distal RIGHT humerus. Degenerative changes of the thoracic spine. IMPRESSION: 1. Lucency traversing the proximal RIGHT humerus may reflect a nondisplaced fracture. Consider additional dedicated radiographs for further evaluation. 2. Similar appearance of enlarged cardiomediastinal silhouette although evaluation is limited secondary to patient rotation. Mildly increased reticulonodular opacities of the RIGHT lung periphery likely reflecting aspiration or atelectasis. Superimposed infection could present similarly. Electronically Signed   By: Valentino Saxon MD   On: 06/19/2020 12:37   DG Humerus Right  Result Date: 06/19/2020 CLINICAL DATA:  Fall today. EXAM: RIGHT HUMERUS - 2+ VIEW COMPARISON:  None. FINDINGS: Status post surgical internal fixation of old distal right humeral fracture. Severe degenerative changes seen involving the right glenohumeral joint. No acute fracture or dislocation is noted. No soft tissue abnormality is noted. IMPRESSION: Status post surgical internal  fixation of old distal right humeral fracture. Severe degenerative joint disease of the right glenohumeral joint. No acute fracture or dislocation is noted. Electronically Signed   By: Marijo Conception M.D.   On: 06/19/2020 13:12   DG Hip Unilat W or Wo Pelvis 2-3 Views Left  Result Date: 06/19/2020 CLINICAL DATA:  Fall EXAM: DG HIP (WITH OR WITHOUT PELVIS) 2-3V LEFT; LEFT FEMUR 2 VIEWS COMPARISON:  None. FINDINGS: Evaluation is limited secondary to patient rotation. Osteopenia. There is a foreshortened fracture of the mid femoral shaft. Distal fragment is displaced medially by 1.5 shaft widths. It is foreshortened by approximately 6 cm. There is apex ventral angulation. There is limited assessment of the LEFT hip joint due to patient rotation. Femoral head appears to be located within the acetabulum. Overlying bowel gas limits evaluation of the sacrum. Degenerative changes of the lower lumbar spine. Vascular calcifications. IMPRESSION: Foreshortened, displaced and angulated fracture of the mid left femoral shaft. If persistent concern for nondisplaced hip or pelvic fracture, recommend dedicated pelvic MRI or CT. Electronically Signed   By: Valentino Saxon MD   On: 06/19/2020 12:45   DG Femur Min 2 Views Left  Result Date: 06/19/2020 CLINICAL DATA:  Fall EXAM: DG HIP (WITH OR WITHOUT PELVIS) 2-3V LEFT; LEFT FEMUR 2 VIEWS COMPARISON:  None. FINDINGS: Evaluation is limited secondary to patient rotation. Osteopenia. There is a foreshortened fracture of the mid femoral shaft. Distal fragment is displaced medially by 1.5 shaft widths. It is foreshortened by approximately 6 cm. There is apex ventral angulation. There is limited assessment of the LEFT hip joint due to patient rotation. Femoral head appears to be located within the acetabulum. Overlying bowel gas limits evaluation of the sacrum. Degenerative changes of the lower lumbar spine. Vascular calcifications. IMPRESSION: Foreshortened, displaced and  angulated fracture of the mid left femoral shaft. If persistent concern for nondisplaced hip or pelvic fracture, recommend dedicated pelvic MRI or CT. Electronically Signed   By: Valentino Saxon MD   On: 06/19/2020 12:45    Positive ROS: Unable to obtain due to dementia  Physical Exam: General: Alert, no acute distress Cardiovascular: No pedal edema Respiratory: No cyanosis, no use of accessory musculature GI: No organomegaly, abdomen is soft and non-tender Skin: No lesions in the area of chief complaint Neurologic: Sensation intact distally Psychiatric: Patient is competent for consent with normal mood and affect Lymphatic: No axillary or cervical lymphadenopathy  MUSCULOSKELETAL: Swelling and an angulated deformity noted LLE. Pedal pulses are present. Patient is able to dorsiflex and plantar felx  Assessment: Foreshortened, displaced, and angulated fracture of the mid left femoral shaft  Plan: Left femur fracture: Hospitalist admission.  Plan for surgery for intramedullary nail with Dr.Tanga Gloor Saturday morning. Hold chemical DVT prophylaxis, NPO after midnight, bed rest, Bucks traction for comfort   Dorothyann Peng, [place] [phone]    06/19/2020 3:21 PM    ADDENDUM: I reviewed her x-rays which were worrisome for pathologic fx. CT femur was then ordered, suggesting pathologic fx. CT CAP was then obtained showing widespread metastatic dz. Will have pathology available for frozen section intraop today during IM nail. Will need oncology work up.  Bertram Savin, MD 06/20/20

## 2020-06-19 NOTE — ED Notes (Signed)
[phone] [name] - son)

## 2020-06-20 ENCOUNTER — Encounter (HOSPITAL_COMMUNITY)
Admission: EM | Disposition: A | Discharge status: Skilled Nursing Facility | Payer: Self-pay | Source: Home / Self Care | Attending: Student

## 2020-06-20 ENCOUNTER — Inpatient Hospital Stay (HOSPITAL_COMMUNITY): Payer: Medicare Other | Admitting: Certified Registered Nurse Anesthetist

## 2020-06-20 ENCOUNTER — Inpatient Hospital Stay (HOSPITAL_COMMUNITY): Payer: Medicare Other

## 2020-06-20 ENCOUNTER — Inpatient Hospital Stay: Admit: 2020-06-20 | Payer: Medicare Other | Admitting: Orthopedic Surgery

## 2020-06-20 ENCOUNTER — Encounter (HOSPITAL_COMMUNITY): Payer: Self-pay | Admitting: Internal Medicine

## 2020-06-20 DIAGNOSIS — I480 Paroxysmal atrial fibrillation: Secondary | ICD-10-CM

## 2020-06-20 DIAGNOSIS — I959 Hypotension, unspecified: Secondary | ICD-10-CM

## 2020-06-20 DIAGNOSIS — E876 Hypokalemia: Secondary | ICD-10-CM

## 2020-06-20 DIAGNOSIS — F0390 Unspecified dementia without behavioral disturbance: Secondary | ICD-10-CM

## 2020-06-20 DIAGNOSIS — M899 Disorder of bone, unspecified: Secondary | ICD-10-CM

## 2020-06-20 DIAGNOSIS — Z66 Do not resuscitate: Secondary | ICD-10-CM

## 2020-06-20 DIAGNOSIS — M84552A Pathological fracture in neoplastic disease, left femur, initial encounter for fracture: Principal | ICD-10-CM

## 2020-06-20 DIAGNOSIS — E872 Acidosis: Secondary | ICD-10-CM

## 2020-06-20 HISTORY — PX: FEMUR IM NAIL: SHX1597

## 2020-06-20 LAB — CBC
HCT: 36.6 % (ref 36.0–46.0)
Hemoglobin: 12 g/dL (ref 12.0–15.0)
MCH: 31.6 pg (ref 26.0–34.0)
MCHC: 32.8 g/dL (ref 30.0–36.0)
MCV: 96.3 fL (ref 80.0–100.0)
Platelets: 154 10*3/uL (ref 150–400)
RBC: 3.8 MIL/uL — ABNORMAL LOW (ref 3.87–5.11)
RDW: 14.6 % (ref 11.5–15.5)
WBC: 9.6 10*3/uL (ref 4.0–10.5)
nRBC: 0 % (ref 0.0–0.2)

## 2020-06-20 LAB — BASIC METABOLIC PANEL
Anion gap: 10 (ref 5–15)
BUN: 14 mg/dL (ref 8–23)
CO2: 27 mmol/L (ref 22–32)
Calcium: 8.4 mg/dL — ABNORMAL LOW (ref 8.9–10.3)
Chloride: 105 mmol/L (ref 98–111)
Creatinine, Ser: 0.87 mg/dL (ref 0.44–1.00)
GFR, Estimated: >60 mL/min (ref >60)
Glucose, Bld: 98 mg/dL (ref 70–99)
Potassium: 4.3 mmol/L (ref 3.5–5.1)
Sodium: 142 mmol/L (ref 135–145)

## 2020-06-20 LAB — HEMOGLOBIN AND HEMATOCRIT, BLOOD
HCT: 30.1 % — ABNORMAL LOW (ref 36.0–46.0)
Hemoglobin: 9.8 g/dL — ABNORMAL LOW (ref 12.0–15.0)

## 2020-06-20 SURGERY — INSERTION, INTRAMEDULLARY ROD, FEMUR
Anesthesia: General | Site: Hip | Laterality: Left

## 2020-06-20 MED ORDER — HYDROCODONE-ACETAMINOPHEN 5-325 MG PO TABS: 1.0000 | ORAL_TABLET | ORAL | Status: DC | PRN

## 2020-06-20 MED ORDER — FENTANYL CITRATE (PF) 100 MCG/2ML IJ SOLN
25.0000 ug | INTRAMUSCULAR | Status: DC | PRN
  Administered 2020-06-20 (×2): 50 ug via INTRAVENOUS

## 2020-06-20 MED ORDER — ONDANSETRON HCL 4 MG/2ML IJ SOLN
INTRAMUSCULAR | Status: AC
  Filled 2020-06-20: qty 2

## 2020-06-20 MED ORDER — ALBUMIN HUMAN 5 % IV SOLN: INTRAVENOUS | Status: DC | PRN

## 2020-06-20 MED ORDER — PHENYLEPHRINE 40 MCG/ML (10ML) SYRINGE FOR IV PUSH (FOR BLOOD PRESSURE SUPPORT)
PREFILLED_SYRINGE | INTRAVENOUS | Status: DC | PRN
  Administered 2020-06-20: 80 ug via INTRAVENOUS
  Administered 2020-06-20: 40 ug via INTRAVENOUS
  Administered 2020-06-20: 80 ug via INTRAVENOUS
  Administered 2020-06-20: 120 ug via INTRAVENOUS
  Administered 2020-06-20: 160 ug via INTRAVENOUS

## 2020-06-20 MED ORDER — APIXABAN 2.5 MG PO TABS
2.5000 mg | ORAL_TABLET | Freq: Two times a day (BID) | ORAL | Status: DC
  Administered 2020-06-21 – 2020-06-22 (×3): 2.5 mg via ORAL
  Filled 2020-06-20 (×4): qty 1

## 2020-06-20 MED ORDER — MENTHOL 3 MG MT LOZG: 1.0000 | LOZENGE | OROMUCOSAL | Status: DC | PRN

## 2020-06-20 MED ORDER — CEFAZOLIN SODIUM-DEXTROSE 2-4 GM/100ML-% IV SOLN
2.0000 g | Freq: Four times a day (QID) | INTRAVENOUS | Status: AC
Start: 2020-06-20 — End: 2020-06-20
  Administered 2020-06-20 (×2): 2 g via INTRAVENOUS
  Filled 2020-06-20 (×2): qty 100

## 2020-06-20 MED ORDER — CEFAZOLIN SODIUM-DEXTROSE 2-4 GM/100ML-% IV SOLN
INTRAVENOUS | Status: AC
  Filled 2020-06-20: qty 100

## 2020-06-20 MED ORDER — DEXAMETHASONE SODIUM PHOSPHATE 10 MG/ML IJ SOLN
INTRAMUSCULAR | Status: DC | PRN
  Administered 2020-06-20: 10 mg via INTRAVENOUS

## 2020-06-20 MED ORDER — DOCUSATE SODIUM 100 MG PO CAPS
100.0000 mg | ORAL_CAPSULE | Freq: Two times a day (BID) | ORAL | Status: DC
  Administered 2020-06-20 – 2020-06-25 (×7): 100 mg via ORAL
  Filled 2020-06-20 (×8): qty 1

## 2020-06-20 MED ORDER — LIDOCAINE 2% (20 MG/ML) 5 ML SYRINGE
INTRAMUSCULAR | Status: DC | PRN
  Administered 2020-06-20: 50 mg via INTRAVENOUS

## 2020-06-20 MED ORDER — ONDANSETRON HCL 4 MG PO TABS: 4.0000 mg | ORAL_TABLET | Freq: Four times a day (QID) | ORAL | Status: DC | PRN

## 2020-06-20 MED ORDER — PROPOFOL 10 MG/ML IV BOLUS
INTRAVENOUS | Status: AC
  Filled 2020-06-20: qty 20

## 2020-06-20 MED ORDER — CHLORHEXIDINE GLUCONATE CLOTH 2 % EX PADS
6.0000 | MEDICATED_PAD | Freq: Every day | CUTANEOUS | Status: DC
  Administered 2020-06-21 – 2020-06-24 (×2): 6 via TOPICAL

## 2020-06-20 MED ORDER — ACETAMINOPHEN 325 MG PO TABS
325.0000 mg | ORAL_TABLET | Freq: Four times a day (QID) | ORAL | Status: DC | PRN
  Administered 2020-06-22: 650 mg via ORAL
  Filled 2020-06-20: qty 2

## 2020-06-20 MED ORDER — FENTANYL CITRATE (PF) 100 MCG/2ML IJ SOLN
INTRAMUSCULAR | Status: AC
  Filled 2020-06-20: qty 2

## 2020-06-20 MED ORDER — LACTATED RINGERS IV SOLN: INTRAVENOUS | Status: DC | PRN

## 2020-06-20 MED ORDER — PHENOL 1.4 % MT LIQD: 1.0000 | OROMUCOSAL | Status: DC | PRN

## 2020-06-20 MED ORDER — DEXAMETHASONE SODIUM PHOSPHATE 10 MG/ML IJ SOLN
INTRAMUSCULAR | Status: AC
  Filled 2020-06-20: qty 1

## 2020-06-20 MED ORDER — ROCURONIUM BROMIDE 10 MG/ML (PF) SYRINGE
PREFILLED_SYRINGE | INTRAVENOUS | Status: DC | PRN
  Administered 2020-06-20: 70 mg via INTRAVENOUS

## 2020-06-20 MED ORDER — HYDROCODONE-ACETAMINOPHEN 7.5-325 MG PO TABS
1.0000 | ORAL_TABLET | ORAL | Status: DC | PRN
  Administered 2020-06-21: 2 via ORAL
  Administered 2020-06-21: 1 via ORAL
  Filled 2020-06-20: qty 1
  Filled 2020-06-20: qty 2

## 2020-06-20 MED ORDER — CHLORHEXIDINE GLUCONATE CLOTH 2 % EX PADS
6.0000 | MEDICATED_PAD | Freq: Every day | CUTANEOUS | Status: DC
Start: 2020-06-20 — End: 2020-06-20

## 2020-06-20 MED ORDER — ISOPROPYL ALCOHOL 70 % SOLN
Status: DC | PRN
  Administered 2020-06-20: 1 via TOPICAL

## 2020-06-20 MED ORDER — MORPHINE SULFATE (PF) 4 MG/ML IV SOLN
0.5000 mg | INTRAVENOUS | Status: DC | PRN
  Administered 2020-06-21 – 2020-06-25 (×4): 1 mg via INTRAVENOUS
  Filled 2020-06-20 (×4): qty 1

## 2020-06-20 MED ORDER — SODIUM CHLORIDE 0.9 % IR SOLN
Status: DC | PRN
  Administered 2020-06-20: 1000 mL

## 2020-06-20 MED ORDER — PROPOFOL 10 MG/ML IV BOLUS
INTRAVENOUS | Status: DC | PRN
  Administered 2020-06-20: 60 mg via INTRAVENOUS

## 2020-06-20 MED ORDER — PHENYLEPHRINE 40 MCG/ML (10ML) SYRINGE FOR IV PUSH (FOR BLOOD PRESSURE SUPPORT)
PREFILLED_SYRINGE | INTRAVENOUS | Status: AC
  Filled 2020-06-20: qty 10

## 2020-06-20 MED ORDER — SUGAMMADEX SODIUM 200 MG/2ML IV SOLN
INTRAVENOUS | Status: DC | PRN
  Administered 2020-06-20: 200 mg via INTRAVENOUS

## 2020-06-20 MED ORDER — APIXABAN 5 MG PO TABS
5.0000 mg | ORAL_TABLET | Freq: Two times a day (BID) | ORAL | Status: DC
  Filled 2020-06-20: qty 1

## 2020-06-20 MED ORDER — ONDANSETRON HCL 4 MG/2ML IJ SOLN: 4.0000 mg | Freq: Four times a day (QID) | INTRAMUSCULAR | Status: DC | PRN

## 2020-06-20 MED ORDER — ISOPROPYL ALCOHOL 70 % SOLN
Status: AC
  Filled 2020-06-20: qty 480

## 2020-06-20 MED ORDER — ONDANSETRON HCL 4 MG/2ML IJ SOLN
INTRAMUSCULAR | Status: DC | PRN
  Administered 2020-06-20: 4 mg via INTRAVENOUS

## 2020-06-20 MED ORDER — FENTANYL CITRATE (PF) 100 MCG/2ML IJ SOLN
INTRAMUSCULAR | Status: DC | PRN
  Administered 2020-06-20: 50 ug via INTRAVENOUS

## 2020-06-20 SURGICAL SUPPLY — 50 items
ADH SKN CLS APL DERMABOND .7 (GAUZE/BANDAGES/DRESSINGS) ×1
APL PRP STRL LF DISP 70% ISPRP (MISCELLANEOUS) ×1
BAG SPEC THK2 15X12 ZIP CLS (MISCELLANEOUS)
BAG ZIPLOCK 12X15 (MISCELLANEOUS) IMPLANT
BIT DRILL 4.3MMS DISTAL GRDTED (BIT) IMPLANT
CHLORAPREP W/TINT 26 (MISCELLANEOUS) ×2 IMPLANT
COVER PERINEAL POST (MISCELLANEOUS) ×2 IMPLANT
COVER SURGICAL LIGHT HANDLE (MISCELLANEOUS) ×2 IMPLANT
COVER WAND RF STERILE (DRAPES) IMPLANT
DERMABOND ADVANCED (GAUZE/BANDAGES/DRESSINGS) ×1
DERMABOND ADVANCED .7 DNX12 (GAUZE/BANDAGES/DRESSINGS) ×1 IMPLANT
DRAPE C-ARM 42X120 X-RAY (DRAPES) ×2 IMPLANT
DRAPE C-ARMOR (DRAPES) ×2 IMPLANT
DRAPE SHEET LG 3/4 BI-LAMINATE (DRAPES) ×2 IMPLANT
DRAPE STERI IOBAN 125X83 (DRAPES) ×2 IMPLANT
DRAPE U-SHAPE 47X51 STRL (DRAPES) ×4 IMPLANT
DRILL 4.3MMS DISTAL GRADUATED (BIT) ×2
DRSG AQUACEL AG 3.5X4 (GAUZE/BANDAGES/DRESSINGS) ×1 IMPLANT
DRSG AQUACEL AG ADV 3.5X 6 (GAUZE/BANDAGES/DRESSINGS) ×2 IMPLANT
DRSG AQUACEL AG ADV 3.5X10 (GAUZE/BANDAGES/DRESSINGS) ×2 IMPLANT
FACESHIELD WRAPAROUND (MASK) ×6 IMPLANT
FACESHIELD WRAPAROUND OR TEAM (MASK) ×3 IMPLANT
GAUZE SPONGE 4X4 12PLY STRL (GAUZE/BANDAGES/DRESSINGS) ×2 IMPLANT
GLOVE BIO SURGEON STRL SZ8.5 (GLOVE) ×4 IMPLANT
GLOVE BIOGEL M 8.0 STRL (GLOVE) ×6 IMPLANT
GLOVE SRG 8 PF TXTR STRL LF DI (GLOVE) ×1 IMPLANT
GLOVE SURG UNDER LTX SZ7.5 (GLOVE) ×2 IMPLANT
GLOVE SURG UNDER POLY LF SZ8 (GLOVE) ×2
GLOVE SURG UNDER POLY LF SZ8.5 (GLOVE) ×2 IMPLANT
GOWN SPEC L3 XXLG W/TWL (GOWN DISPOSABLE) ×2 IMPLANT
GOWN STRL REUS W/TWL XL LVL3 (GOWN DISPOSABLE) ×2 IMPLANT
GUIDEPIN 3.2X17.5 THRD DISP (PIN) ×1 IMPLANT
GUIDEWIRE BALL NOSE 100CM (WIRE) ×1 IMPLANT
KIT BASIN OR (CUSTOM PROCEDURE TRAY) ×2 IMPLANT
KIT TURNOVER KIT A (KITS) ×2 IMPLANT
MANIFOLD NEPTUNE II (INSTRUMENTS) ×2 IMPLANT
MARKER SKIN DUAL TIP RULER LAB (MISCELLANEOUS) ×2 IMPLANT
NAIL IM AFFIXUS 11X380 125D LT (Nail) ×1 IMPLANT
PACK TOTAL JOINT (CUSTOM PROCEDURE TRAY) ×2 IMPLANT
PENCIL SMOKE EVACUATOR (MISCELLANEOUS) IMPLANT
SCREW BONE CORTICAL 5.0X40 (Screw) ×1 IMPLANT
SCREW LAG HIP NAIL 10.5X95 (Screw) ×1 IMPLANT
SUT MNCRL AB 3-0 PS2 18 (SUTURE) IMPLANT
SUT MON AB 2-0 CT1 36 (SUTURE) ×2 IMPLANT
SUT VIC AB 1 CT1 27 (SUTURE) ×2
SUT VIC AB 1 CT1 27XBRD ANTBC (SUTURE) ×1 IMPLANT
TOWEL OR 17X26 10 PK STRL BLUE (TOWEL DISPOSABLE) ×2 IMPLANT
TOWEL OR NON WOVEN STRL DISP B (DISPOSABLE) ×2 IMPLANT
TRAY FOLEY MTR SLVR 14FR STAT (SET/KITS/TRAYS/PACK) ×2 IMPLANT
YANKAUER SUCT BULB TIP NO VENT (SUCTIONS) ×2 IMPLANT

## 2020-06-20 NOTE — Progress Notes (Signed)
PROGRESS NOTE  Jacqueline [name] EVO:[id number] DOB: May 16, 1928   PCP: Lajean Manes, MD  Patient is from: SNF  DOA: 06/19/2020 LOS: 1  Chief complaints: Left thigh pain  Brief Narrative / Interim history: 87 year old F with PMH of A. fib on Eliquis, dementia, HTN, anxiety and GERD brought to ED from SNF with left thigh pain. She heard a loud pop when she pivoted on left leg for transfer.  X-ray and CT showed displaced, foreshortened and angulated left femoral shaft fracture concerning for pathologic fracture.   Orthopedic surgery consulted.  CT chest, abdomen and pelvis revealed additional aggressive bone lesions (T8, left third rib, L2, upper right ilium and left ilium) with no primary malignancy within chest or abdomen.  She underwent intramedullary fixation of left femoral by Dr. Lyla Glassing on 2/19.  Oncology, Dr. Irene Limbo recommended myeloma panel, light chains and outpatient follow-up after pathology result.   Subjective: Seen and examined earlier this morning before she went down for surgery.  She is somewhat sleepy but wakes to voice easily.  She denies pain but not a great historian.  Does not appear to be in distress  Objective: Vitals:   06/20/20 0103 06/20/20 0542 06/20/20 0800 06/20/20 0811  BP: 139/65 (!) 107/58    Pulse:  74    Resp:  18    Temp:  98 F (36.7 C)    TempSrc:  Oral    SpO2:  (!) 78% 96%   Weight:    65.1 kg  Height:    '5\' 1"'  (1.549 m)    Intake/Output Summary (Last 24 hours) at 06/20/2020 1213 Last data filed at 06/20/2020 0811 Gross per 24 hour  Intake 956.18 ml  Output 100 ml  Net 856.18 ml   Filed Weights   06/20/20 0811  Weight: 65.1 kg    Examination:  GENERAL: No apparent distress.  Nontoxic. HEENT: MMM.  Vision and hearing grossly intact.  NECK: Supple.  No apparent JVD.  RESP:  No IWOB.  Fair aeration bilaterally. CVS:  RRR. Heart sounds normal.  ABD/GI/GU: BS+. Abd soft, NTND.  MSK/EXT: Traction weight to left leg. SKIN: no apparent skin  lesion or wound NEURO: Sleepy but wakes to voice easily.  No apparent focal neuro deficit. PSYCH: Calm. Normal affect.   Procedures:  2/19-intramedullary fixation of left femoral shaft fracture  Microbiology summarized: COVID-19 PCR nonreactive. MRSA PCR screen nonreactive.  Assessment & Plan: Displaced and angulated left femoral shaft fracture-likely pathologic given additional aggressive bone lesions elsewhere as noted above.  She has elevated alk phos with normal GGT.  -S/p intramedullary fixation of left humeral shaft fracture -Pain control and VTE prophylaxis per orthopedic surgery -Oncology, Dr. Irene Limbo recommended myeloma panel,  light chains, waiting on pathology and outpatient follow-up  Hypotension: Seems to have resolved with IV fluid.  No signs of infection.  TTE in 2020 with normal LVEF -Continue holding home Cardizem, metoprolol and Lasix.  Paroxysmal A. fib: Rate controlled. -Home Cardizem, metoprolol and Eliquis on hold -Optimize K and MG  Elevated alk phos with normal GGT-likely osseous source -Evaluation as above  Hypokalemia/hypomagnesemia: Hypokalemia resolved -Monitor and replenish aggressively  Hyperglycemia without diagnosis of diabetes: A1c 5.2%.  Resolved.  Lactic acidosis: Likely mediated by hypotension.  No evidence of infection.  Resolved.  Dementia without behavioral disturbance -Reorientation and delirium precautions.  Anxiety-takes Xanax at home. -Continue reduced dose of this  Goal of care/DNR/DNI-appropriate. -Further goal of care discussion based on clinical course and pathology finding  Body mass index is  27.11 kg/m.         DVT prophylaxis:  SCD  Code Status: DNR/DNI Family Communication: Updated patient's son over the phone. Level of care: Progressive Status is: Inpatient  Remains inpatient appropriate because:Hemodynamically unstable, Ongoing diagnostic testing needed not appropriate for outpatient work up, Unsafe d/c  plan, IV treatments appropriate due to intensity of illness or inability to take PO and Inpatient level of care appropriate due to severity of illness   Dispo: The patient is from: SNF              Anticipated d/c is to: SNF              Anticipated d/c date is: > 3 days              Patient currently is not medically stable to d/c.   Difficult to place patient No       Consultants:  Orthopedic surgery Oncology   Sch Meds:  Scheduled Meds: . [MAR Hold] docusate sodium  200 mg Oral Daily  . [MAR Hold] pantoprazole  40 mg Oral q AM   Continuous Infusions: . sodium chloride    . sodium chloride 100 mL/hr at 06/19/20 1758  . ceFAZolin    .  ceFAZolin (ANCEF) IV    . tranexamic acid     PRN Meds:.[MAR Hold] ALPRAZolam, [MAR Hold] HYDROcodone-acetaminophen, [MAR Hold] polyvinyl alcohol  Antimicrobials: Anti-infectives (From admission, onward)   Start     Dose/Rate Route Frequency Ordered Stop   06/20/20 1203  ceFAZolin (ANCEF) 2-4 GM/100ML-% IVPB       Note to Pharmacy: Bernadene Person   : cabinet override      06/20/20 1203 06/21/20 0014   06/20/20 0600  ceFAZolin (ANCEF) IVPB 2g/100 mL premix        2 g 200 mL/hr over 30 Minutes Intravenous On call to O.R. 06/19/20 2027 06/21/20 0559       I have personally reviewed the following labs and images: CBC: Recent Labs  Lab 06/19/20 1154 06/19/20 1605 06/19/20 2145 06/20/20 0631  WBC 13.8*  --   --  9.6  NEUTROABS 12.3*  --   --   --   HGB 13.3 10.9* 11.4* 12.0  HCT 40.3 33.9* 34.8* 36.6  MCV 95.0  --   --  96.3  PLT 164  --   --  154   BMP &GFR Recent Labs  Lab 06/19/20 1154 06/19/20 1803 06/20/20 0631  NA 142  --  142  K 2.9*  --  4.3  CL 97*  --  105  CO2 29  --  27  GLUCOSE 174*  --  98  BUN 14  --  14  CREATININE 0.94  --  0.87  CALCIUM 8.8*  --  8.4*  MG  --  1.5*  --    Estimated Creatinine Clearance: 37.9 mL/min (by C-G formula based on SCr of 0.87 mg/dL). Liver & Pancreas: Recent Labs   Lab 06/19/20 1154  AST 18  ALT 8  ALKPHOS 233*  BILITOT 1.3*  PROT 6.5  ALBUMIN 3.0*   No results for input(s): LIPASE, AMYLASE in the last 168 hours. No results for input(s): AMMONIA in the last 168 hours. Diabetic: Recent Labs    06/19/20 1803  HGBA1C 5.2   No results for input(s): GLUCAP in the last 168 hours. Cardiac Enzymes: No results for input(s): CKTOTAL, CKMB, CKMBINDEX, TROPONINI in the last 168 hours. No results for input(s): PROBNP in the  last 8760 hours. Coagulation Profile: No results for input(s): INR, PROTIME in the last 168 hours. Thyroid Function Tests: No results for input(s): TSH, T4TOTAL, FREET4, T3FREE, THYROIDAB in the last 72 hours. Lipid Profile: No results for input(s): CHOL, HDL, LDLCALC, TRIG, CHOLHDL, LDLDIRECT in the last 72 hours. Anemia Panel: No results for input(s): VITAMINB12, FOLATE, FERRITIN, TIBC, IRON, RETICCTPCT in the last 72 hours. Urine analysis:    Component Value Date/Time   COLORURINE YELLOW 06/19/2020 2115   APPEARANCEUR CLEAR 06/19/2020 2115   LABSPEC 1.018 06/19/2020 2115   PHURINE 6.0 06/19/2020 2115   GLUCOSEU NEGATIVE 06/19/2020 2115   HGBUR NEGATIVE 06/19/2020 2115   BILIRUBINUR NEGATIVE 06/19/2020 2115   KETONESUR NEGATIVE 06/19/2020 2115   PROTEINUR NEGATIVE 06/19/2020 2115   UROBILINOGEN 0.2 06/18/2014 1950   NITRITE NEGATIVE 06/19/2020 2115   LEUKOCYTESUR NEGATIVE 06/19/2020 2115   Sepsis Labs: Invalid input(s): PROCALCITONIN, [place]  Microbiology: Recent Results (from the past 240 hour(s))  SARS CORONAVIRUS 2 (TAT 6-24 HRS) Nasopharyngeal Nasopharyngeal Swab     Status: None   Collection Time: 06/19/20 11:55 AM   Specimen: Nasopharyngeal Swab  Result Value Ref Range Status   SARS Coronavirus 2 NEGATIVE NEGATIVE Final    Comment: (NOTE) SARS-CoV-2 target nucleic acids are NOT DETECTED.  The SARS-CoV-2 RNA is generally detectable in upper and lower respiratory specimens during the acute phase of  infection. Negative results do not preclude SARS-CoV-2 infection, do not rule out co-infections with other pathogens, and should not be used as the sole basis for treatment or other patient management decisions. Negative results must be combined with clinical observations, patient history, and epidemiological information. The expected result is Negative.  Fact Sheet for Patients: SugarRoll.be  Fact Sheet for Healthcare Providers: https://www.woods-mathews.com/  This test is not yet approved or cleared by the [place] FDA and  has been authorized for detection and/or diagnosis of SARS-CoV-2 by FDA under an Emergency Use Authorization (EUA). This EUA will remain  in effect (meaning this test can be used) for the duration of the COVID-19 declaration under Se ction 564(b)(1) of the Act, 21 U.S.C. section 360bbb-3(b)(1), unless the authorization is terminated or revoked sooner.  Performed at [facility], [place] [address] [place], [place] [address]   MRSA PCR Screening     Status: None   Collection Time: 06/19/20  9:15 PM   Specimen: Nasal Mucosa; Nasopharyngeal  Result Value Ref Range Status   MRSA by PCR NEGATIVE NEGATIVE Final    Comment:        The GeneXpert MRSA Assay (FDA approved for NASAL specimens only), is one component of a comprehensive MRSA colonization surveillance program. It is not intended to diagnose MRSA infection nor to guide or monitor treatment for MRSA infections. Performed at [facility], [place] [address], [place], [place] [address]     Radiology Studies: CT ABDOMEN PELVIS WO CONTRAST  Result Date: 06/20/2020 CLINICAL DATA:  Cancer of unknown primary. Pathologic femur fracture. EXAM: CT CHEST, ABDOMEN AND PELVIS WITHOUT CONTRAST TECHNIQUE: Multidetector CT imaging of the chest, abdomen and pelvis was performed following the standard protocol without IV contrast. COMPARISON:  Femur CT  from yesterday. FINDINGS: CT CHEST FINDINGS Cardiovascular: Normal heart size. Heavily calcified aorta and great vessels. Coronary atherosclerosis. No acute vascular finding without contrast. Dilated upper descending aorta measuring up to 3.9 cm in diameter. Mediastinum/Nodes: Large hiatal hernia containing stomach and pancreas. Lungs/Pleura: Bandlike opacities most consistent with atelectasis. No definite pulmonary mass or airway cut  off. There is no edema, consolidation, effusion, or pneumothorax. Musculoskeletal: T8 lytic lesion within the body with superior endplate fracture. Posterior left third rib fracture with underlying lytic lesion and permeative appearance. Advanced spinal degeneration. Severe bilateral glenohumeral osteoarthritis. CT ABDOMEN PELVIS FINDINGS Hepatobiliary: No focal liver abnormality.Full gallbladder without calcified stone or acute inflammation. Distended common bile duct without internal filling defect seen. Pancreas: Generalized atrophy. Partial herniation into the chest via a hiatal hernia. Spleen: Unremarkable. Adrenals/Urinary Tract: Negative adrenals. No hydronephrosis or stone. Unremarkable bladder. Stomach/Bowel: Severe distal colonic diverticulosis. No obstruction or visible mass. No visible bowel inflammation. Vascular/Lymphatic: No acute vascular abnormality. Extensive atheromatous calcification of the aorta. No mass or adenopathy. Reproductive:No pathologic findings. Other: Lax abdominal wall with probable very broad spigelian hernia on the right containing multiple small bowel loops. Musculoskeletal: Aggressive/permeative lesion in the upper right ilium. Known left femur fracture which is partially covered on this study. There is likely a mixed sclerotic and lucent lesion in the left ilium. Lytic lesion in the L2 vertebral body without acute fracture. Given osteopenia, there could be even more extensive and obscured spinal disease. Advanced lumbar spine degeneration with  scoliosis. IMPRESSION: 1. Additional aggressive bone lesions compatible with metastatic disease. No primary malignancy is seen within the chest and abdominal viscera. 2. Incidental findings are described above. Electronically Signed   By: Monte Fantasia M.D.   On: 06/20/2020 08:04   DG Chest 1 View  Result Date: 06/19/2020 CLINICAL DATA:  Fall EXAM: CHEST  1 VIEW COMPARISON:  December 29, 2019 FINDINGS: Evaluation is limited secondary to patient rotation. Revisualization of cardiomegaly. Triangular opacity along the RIGHT perihilar border is similar in comparison to prior when accounting for differences in patient rotation. Elevation of the RIGHT hemidiaphragm, unchanged. Atherosclerotic calcifications of the aorta. Large hiatal hernia. Reticulonodular opacities of the RIGHT lung periphery. Hazy opacity overlying the LEFT upper lung, likely due to skin fold given persistence of lung markings distal to this opacity. No definitive pneumothorax or pleural effusion. Lucency traversing the proximal RIGHT humerus. Advanced degenerative changes of the RIGHT humerus. Status post ORIF of the distal RIGHT humerus. Degenerative changes of the thoracic spine. IMPRESSION: 1. Lucency traversing the proximal RIGHT humerus may reflect a nondisplaced fracture. Consider additional dedicated radiographs for further evaluation. 2. Similar appearance of enlarged cardiomediastinal silhouette although evaluation is limited secondary to patient rotation. Mildly increased reticulonodular opacities of the RIGHT lung periphery likely reflecting aspiration or atelectasis. Superimposed infection could present similarly. Electronically Signed   By: Valentino Saxon MD   On: 06/19/2020 12:37   CT CHEST WO CONTRAST  Result Date: 06/20/2020 CLINICAL DATA:  Cancer of unknown primary. Pathologic femur fracture. EXAM: CT CHEST, ABDOMEN AND PELVIS WITHOUT CONTRAST TECHNIQUE: Multidetector CT imaging of the chest, abdomen and pelvis was  performed following the standard protocol without IV contrast. COMPARISON:  Femur CT from yesterday. FINDINGS: CT CHEST FINDINGS Cardiovascular: Normal heart size. Heavily calcified aorta and great vessels. Coronary atherosclerosis. No acute vascular finding without contrast. Dilated upper descending aorta measuring up to 3.9 cm in diameter. Mediastinum/Nodes: Large hiatal hernia containing stomach and pancreas. Lungs/Pleura: Bandlike opacities most consistent with atelectasis. No definite pulmonary mass or airway cut off. There is no edema, consolidation, effusion, or pneumothorax. Musculoskeletal: T8 lytic lesion within the body with superior endplate fracture. Posterior left third rib fracture with underlying lytic lesion and permeative appearance. Advanced spinal degeneration. Severe bilateral glenohumeral osteoarthritis. CT ABDOMEN PELVIS FINDINGS Hepatobiliary: No focal liver abnormality.Full gallbladder without calcified stone or  acute inflammation. Distended common bile duct without internal filling defect seen. Pancreas: Generalized atrophy. Partial herniation into the chest via a hiatal hernia. Spleen: Unremarkable. Adrenals/Urinary Tract: Negative adrenals. No hydronephrosis or stone. Unremarkable bladder. Stomach/Bowel: Severe distal colonic diverticulosis. No obstruction or visible mass. No visible bowel inflammation. Vascular/Lymphatic: No acute vascular abnormality. Extensive atheromatous calcification of the aorta. No mass or adenopathy. Reproductive:No pathologic findings. Other: Lax abdominal wall with probable very broad spigelian hernia on the right containing multiple small bowel loops. Musculoskeletal: Aggressive/permeative lesion in the upper right ilium. Known left femur fracture which is partially covered on this study. There is likely a mixed sclerotic and lucent lesion in the left ilium. Lytic lesion in the L2 vertebral body without acute fracture. Given osteopenia, there could be even  more extensive and obscured spinal disease. Advanced lumbar spine degeneration with scoliosis. IMPRESSION: 1. Additional aggressive bone lesions compatible with metastatic disease. No primary malignancy is seen within the chest and abdominal viscera. 2. Incidental findings are described above. Electronically Signed   By: Monte Fantasia M.D.   On: 06/20/2020 08:04   CT FEMUR LEFT WO CONTRAST  Result Date: 06/19/2020 CLINICAL DATA:  Fall EXAM: CT OF THE LOWER LEFT EXTREMITY WITHOUT CONTRAST TECHNIQUE: Multidetector CT imaging of the lower left extremity was performed according to the standard protocol. COMPARISON:  Radiographs 06/19/2020 FINDINGS: Bones/Joint/Cartilage Redemonstration of the internally rotated, posterolaterally displaced, foreshortened and varus angulated mid femoral diaphyseal fracture. While the bones appear diffusely demineralized, there is a more focally permeative appearance of the cortex and intermediate medullary attenuation about the fracture line. While some of this may be attributable to intramedullary hemorrhage and demineralization, the overall appearance is highly conspicuous for an underlying pathologic fracture. No additional acute fracture is seen. Included bones of the pelvis appear intact and congruent. Proximal femoral heads are normally located. Severe left and moderate right hip osteoarthrosis. Moderate tricompartmental degenerative changes noted in the right knee. Additional degenerative changes noted incidentally included left hand and wrist. Ligaments Suboptimally assessed by CT. Muscles and Tendons Intramuscular thickening and hemorrhage with stranding about the fracture line. Ligamentous and muscular laxity related to foreshortening and displacement across the fracture line. No fully retracted or torn tendons are identified. Soft tissues Markedly suboptimal assessment of the vasculature in the absence of contrast media. Extensive atherosclerotic calcification helps to  delineate the course of the superficial femoral and popliteal arteries as well as the proximal runoff vessels without clear surrounding hemorrhage or hematoma. Stranding, thickening and swelling adjacent the fracture line. Included portions of the abdomen and pelvis demonstrate significant abdominal wall laxity and some laxity of the pelvic floor as well. Extensive colonic diverticulosis. Some questionable stranding noted adjacent the proximal sigmoid, nonspecific. Vascular calcium in the pelvis. IMPRESSION: 1. Redemonstration of the internally rotated, posterolaterally displaced, foreshortened and varus angulated mid femoral diaphyseal fracture. Focally permeative appearance of the cortex and intermediate medullary attenuation about the fracture line; while some of this may be attributable to intramedullary hemorrhage and demineralization, the overall appearance is highly conspicuous for an underlying pathologic fracture. 2. No additional acute fracture is seen. 3. Markedly suboptimal assessment of the vasculature in the absence of contrast media. Extensive atherosclerotic calcification helps to delineate the course of the superficial femoral and popliteal arteries as well as the proximal runoff vessels without clear surrounding hemorrhage or hematoma. Correlate with pulse assessment and clinical exam findings. 4. Severe left and moderate right hip osteoarthrosis. 5. Extensive colonic diverticulosis with questionable stranding adjacent the proximal sigmoid, nonspecific.  Correlate for symptoms of acute diverticulitis. 6. Pelvic floor and abdominal wall laxity. Electronically Signed   By: Lovena Le M.D.   On: 06/19/2020 23:07   DG Humerus Right  Result Date: 06/19/2020 CLINICAL DATA:  Fall today. EXAM: RIGHT HUMERUS - 2+ VIEW COMPARISON:  None. FINDINGS: Status post surgical internal fixation of old distal right humeral fracture. Severe degenerative changes seen involving the right glenohumeral joint. No acute  fracture or dislocation is noted. No soft tissue abnormality is noted. IMPRESSION: Status post surgical internal fixation of old distal right humeral fracture. Severe degenerative joint disease of the right glenohumeral joint. No acute fracture or dislocation is noted. Electronically Signed   By: Marijo Conception M.D.   On: 06/19/2020 13:12   DG Hip Unilat W or Wo Pelvis 2-3 Views Left  Result Date: 06/19/2020 CLINICAL DATA:  Fall EXAM: DG HIP (WITH OR WITHOUT PELVIS) 2-3V LEFT; LEFT FEMUR 2 VIEWS COMPARISON:  None. FINDINGS: Evaluation is limited secondary to patient rotation. Osteopenia. There is a foreshortened fracture of the mid femoral shaft. Distal fragment is displaced medially by 1.5 shaft widths. It is foreshortened by approximately 6 cm. There is apex ventral angulation. There is limited assessment of the LEFT hip joint due to patient rotation. Femoral head appears to be located within the acetabulum. Overlying bowel gas limits evaluation of the sacrum. Degenerative changes of the lower lumbar spine. Vascular calcifications. IMPRESSION: Foreshortened, displaced and angulated fracture of the mid left femoral shaft. If persistent concern for nondisplaced hip or pelvic fracture, recommend dedicated pelvic MRI or CT. Electronically Signed   By: Valentino Saxon MD   On: 06/19/2020 12:45   DG Femur Min 2 Views Left  Result Date: 06/19/2020 CLINICAL DATA:  Fall EXAM: DG HIP (WITH OR WITHOUT PELVIS) 2-3V LEFT; LEFT FEMUR 2 VIEWS COMPARISON:  None. FINDINGS: Evaluation is limited secondary to patient rotation. Osteopenia. There is a foreshortened fracture of the mid femoral shaft. Distal fragment is displaced medially by 1.5 shaft widths. It is foreshortened by approximately 6 cm. There is apex ventral angulation. There is limited assessment of the LEFT hip joint due to patient rotation. Femoral head appears to be located within the acetabulum. Overlying bowel gas limits evaluation of the sacrum.  Degenerative changes of the lower lumbar spine. Vascular calcifications. IMPRESSION: Foreshortened, displaced and angulated fracture of the mid left femoral shaft. If persistent concern for nondisplaced hip or pelvic fracture, recommend dedicated pelvic MRI or CT. Electronically Signed   By: Valentino Saxon MD   On: 06/19/2020 12:45     Rylynn Kobs T. [place]  If 7PM-7AM, please contact night-coverage [email or web address] 06/20/2020, 12:13 PM

## 2020-06-20 NOTE — Progress Notes (Signed)
Pharmacy Note  87 y/o F on apixaban 5 mg bid PTA for atrial fibrillation admitted with femur fracture for surgical repair. Pharmacy consulted to resume Eliquis at 2.5 mg bid tomorrow assuming hemoglobin is stable. Plan is to resume Eliquis 5 mg bid after 48 hours of prophylaxis dosing.   O:  CBC Latest Ref Rng & Units 06/20/2020 06/19/2020 06/19/2020  WBC 4.0 - 10.5 K/uL 9.6 - -  Hemoglobin 12.0 - 15.0 g/dL 12.0 11.4(L) 10.9(L)  Hematocrit 36.0 - 46.0 % 36.6 34.8(L) 33.9(L)  Platelets 150 - 400 K/uL 154 - -   Filed Weights   06/20/20 0811  Weight: 65.1 kg (143 lb 8 oz)   Lab Results  Component Value Date   CREATININE 0.87 06/20/2020   CREATININE 0.94 06/19/2020   CREATININE 0.74 12/29/2019   Estimated Creatinine Clearance: 37.9 mL/min (by C-G formula based on SCr of 0.87 mg/dL).   A/P:  Will order apixaban for tomorrow at 2.5 mg bid x 48 hours followed by 5 mg bid thereafter. Will have AM clinical pharmacist follow up to make sure patient is appropriate for initiating DOAC. Otherwise, will sign off and follow remotely. Thank you for the consult.   Ulice Dash D  06/20/20 2:02 PM

## 2020-06-20 NOTE — Discharge Instructions (Signed)
Dr. Rod Can Adult Hip & Knee Specialist [facility] [address], [place], [place] [address] [phone]   POSTOPERATIVE DIRECTIONS    Hip Rehabilitation, Guidelines Following Surgery   WEIGHT BEARING Other:  weight bearing as tolerated left leg FOR TRANSFERS   HOME CARE INSTRUCTIONS  Remove items at home which could result in a fall. This includes throw rugs or furniture in walking pathways.  Continue medications as instructed at time of discharge.  You may have some home medications which will be placed on hold until you complete the course of blood thinner medication.  4 days after discharge, you may start showering. No tub baths or soaking your incisions. Do not put on socks or shoes without following the instructions of your caregivers.   Sit on chairs with arms. Use the chair arms to help push yourself up when arising.  Arrange for the use of a toilet seat elevator so you are not sitting low.   Walk with walker as instructed.  You may resume a sexual relationship in one month or when given the OK by your caregiver.  Use walker as long as suggested by your caregivers.  Avoid periods of inactivity such as sitting longer than an hour when not asleep. This helps prevent blood clots.  You may return to work once you are cleared by Engineer, production.  Do not drive a car for 6 weeks or until released by your surgeon.  Do not drive while taking narcotics.  Wear elastic stockings for two weeks following surgery during the day but you may remove then at night.  Make sure you keep all of your appointments after your operation with all of your doctors and caregivers. You should call the office at the above phone number and make an appointment for approximately two weeks after the date of your surgery. Please pick up a stool softener and laxative for home use as long as you are requiring pain medications.  ICE to the affected hip every three hours for  30 minutes at a time and then as needed for pain and swelling. Continue to use ice on the hip for pain and swelling from surgery. You may notice swelling that will progress down to the foot and ankle.  This is normal after surgery.  Elevate the leg when you are not up walking on it.   It is important for you to complete the blood thinner medication as prescribed by your doctor.  Continue to use the breathing machine which will help keep your temperature down.  It is common for your temperature to cycle up and down following surgery, especially at night when you are not up moving around and exerting yourself.  The breathing machine keeps your lungs expanded and your temperature down.  RANGE OF MOTION AND STRENGTHENING EXERCISES  These exercises are designed to help you keep full movement of your hip joint. Follow your caregiver's or physical therapist's instructions. Perform all exercises about fifteen times, three times per day or as directed. Exercise both hips, even if you have had only one joint replacement. These exercises can be done on a training (exercise) mat, on the floor, on a table or on a bed. Use whatever works the best and is most comfortable for you. Use music or television while you are exercising so that the exercises are a pleasant break in your day. This will make your life better with the exercises acting as a break in routine you can look forward  to.  Lying on your back, slowly slide your foot toward your buttocks, raising your knee up off the floor. Then slowly slide your foot back down until your leg is straight again.  Lying on your back spread your legs as far apart as you can without causing discomfort.  Lying on your side, raise your upper leg and foot straight up from the floor as far as is comfortable. Slowly lower the leg and repeat.  Lying on your back, tighten up the muscle in the front of your thigh (quadriceps muscles). You can do this by keeping your leg straight and  trying to raise your heel off the floor. This helps strengthen the largest muscle supporting your knee.  Lying on your back, tighten up the muscles of your buttocks both with the legs straight and with the knee bent at a comfortable angle while keeping your heel on the floor.   SKILLED REHAB INSTRUCTIONS: If the patient is transferred to a skilled rehab facility following release from the hospital, a list of the current medications will be sent to the facility for the patient to continue.  When discharged from the skilled rehab facility, please have the facility set up the patient's [place] prior to being released. Also, the skilled facility will be responsible for providing the patient with their medications at time of release from the facility to include their pain medication and their blood thinner medication. If the patient is still at the rehab facility at time of the two week follow up appointment, the skilled rehab facility will also need to assist the patient in arranging follow up appointment in our office and any transportation needs.  MAKE SURE YOU:  Understand these instructions.  Will watch your condition.  Will get help right away if you are not doing well or get worse.  Pick up stool softner and laxative for home use following surgery while on pain medications. Daily dry dressing changes as needed. In 4 days, you may remove your dressings and begin taking showers - no tub baths or soaking the incisions. Continue to use ice for pain and swelling after surgery. Do not use any lotions or creams on the incision until instructed by your surgeon.

## 2020-06-20 NOTE — Op Note (Signed)
OPERATIVE REPORT  SURGEON: Rod Can, MD   ASSISTANT: Cherlynn June, PA-C.  PREOPERATIVE DIAGNOSIS: Left pathologic femur fracture.   POSTOPERATIVE DIAGNOSIS: Left pathologic femur fracture.    PROCEDURE: Intramedullary fixation, Left femur.   IMPLANTS: Biomet Affixus Hip Fracture Nail, 11 by 380 mm, 125 degrees. 10.5 x 95 mm Hip Fracture Nail Lag Screw. 5 x 40 mm distal interlocking screw 1.  ANESTHESIA:  General  ESTIMATED BLOOD LOSS: 100 mL  ANTIBIOTICS: 2 g Ancef.  FINDINGS: Frozen section - metastatic adenocarcinoma.  DRAINS: None.  COMPLICATIONS: None.   CONDITION: PACU - hemodynamically stable.   BRIEF CLINICAL NOTE: Jacqueline [name] is a 87 y.o. female who presented with a left femur fracture.  Preoperative CT scan of the femur showed possible permeative lytic lesion at the fracture site.  CT chest, abdomen, and pelvis showed diffuse bony metastatic disease.  The patient was admitted to the hospitalist service and underwent perioperative risk stratification and medical optimization. The risks, benefits, and alternatives to the procedure were explained, and the patient elected to proceed.  PROCEDURE IN DETAIL: Surgical site was marked by myself. The patient was taken to the operating room and anesthesia was induced on the bed. The patient was then transferred to the [facility] table and the nonoperative lower extremity was scissored underneath the operative side. The fracture was reduced with traction, internal rotation, and adduction. The hip was prepped and draped in the normal sterile surgical fashion. Timeout was called verifying side and site of surgery. Preop antibiotics were given with 60 minutes of beginning the procedure.  Fluoroscopy was used to define the patient's anatomy. A 4 cm incision was made just proximal to the tip of the greater trochanter. The awl was used to obtain the standard starting point for a trochanteric entry nail under fluoroscopic control. The  guidepin was placed. The entry reamer was used to open the proximal femur.  The fracture was reduced using a reduction finger and with manipulation of the leg.  I placed the guidewire to the level of the physeal scar of the knee. I measured the length of the guidewire. Sequential reaming was performed up to a size 13 mm with excellent chatter.  I set a small amount of abnormal appearing tissue from the reamer for frozen section.  Pathology called [place] to inform that the sample was consistent with metastatic adenocarcinoma.  Therefore, a size 11 by 380 mm nail was selected and assembled to the jig on the back table. The nail was placed without any difficulty. Through a separate stab incision, the cannula was placed down to the bone in preparation for the cephalomedullary device. A guidepin was placed into the femoral head using AP and lateral fluoroscopy views. The pin was measured, and then reaming was performed to the appropriate depth. The lag screw was inserted to the appropriate depth. The fracture was compressed through the jig. The setscrew was tightened and then loosened one quarter turn. Using perfect circle technique, a distal interlocking screw was placed. The jig was removed. Final AP and lateral fluoroscopy views were obtained to confirm fracture reduction and hardware placement. Tip apex distance was appropriate. There was no chondral penetration.  The wounds were copiously irrigated with saline. The wound was closed in layers with #1 Vicryl for the fascia, 2-0 Monocryl for the deep dermal layer, and staples for the skin. Glue was applied to the skin. Once the glue was fully hardened, sterile dressing was applied. The patient was then awakened from anesthesia and taken  to the PACU in stable condition. Sponge needle and instrument counts were correct at the end of the case 2. There were no known complications.  We will readmit the patient to the hospitalist. Weightbearing status will be  weightbearing as tolerated with a walker for transfers. We will start apixaban at 2.5 mg PO BID beginning tomorrow morning, and then after 48 hours, increase to home dose. The patient will work with physical therapy and undergo disposition planning. Follow up final pathology.   Please note that a surgical assistant was a medical necessity for this procedure to perform it in a safe and expeditious manner. Assistant was necessary to provide appropriate retraction of vital neurovascular structures, to prevent femoral fracture, and to allow for anatomic placement of the prosthesis.

## 2020-06-20 NOTE — Anesthesia Postprocedure Evaluation (Signed)
Anesthesia Post Note  Patient: Telia Amundson  Procedure(s) Performed: INTRAMEDULLARY (IM) NAIL FEMORAL (Left Hip)     Patient location during evaluation: PACU Anesthesia Type: General Level of consciousness: awake and alert Pain management: pain level controlled Vital Signs Assessment: post-procedure vital signs reviewed and stable Respiratory status: spontaneous breathing, nonlabored ventilation, respiratory function stable and patient connected to nasal cannula oxygen Cardiovascular status: blood pressure returned to baseline and stable Postop Assessment: no apparent nausea or vomiting Anesthetic complications: no   No complications documented.  Last Vitals:  Vitals:   06/20/20 1600 06/20/20 1615  BP: 98/67 97/62  Pulse: 94 94  Resp:    Temp:    SpO2:      Last Pain:  Vitals:   06/20/20 1505  TempSrc:   PainSc: [place] Julien Oscar

## 2020-06-20 NOTE — Plan of Care (Signed)
  Problem: Education: Goal: Knowledge of General Education information will improve Description: Including pain rating scale, medication(s)/side effects and non-pharmacologic comfort measures 06/20/2020 0815 by Marcella Dubs, RN Outcome: Progressing 06/19/2020 1826 by Marcella Dubs, RN Outcome: Progressing   Problem: Activity: Goal: Risk for activity intolerance will decrease 06/20/2020 0815 by Marcella Dubs, RN Outcome: Progressing 06/19/2020 1826 by Marcella Dubs, RN Outcome: Progressing   Problem: Nutrition: Goal: Adequate nutrition will be maintained Outcome: Progressing   Problem: Pain Managment: Goal: General experience of comfort will improve 06/20/2020 0815 by Marcella Dubs, RN Outcome: Progressing 06/19/2020 1826 by Marcella Dubs, RN Outcome: Progressing   Problem: Safety: Goal: Ability to remain free from injury will improve 06/20/2020 0815 by Marcella Dubs, RN Outcome: Progressing 06/19/2020 1826 by Marcella Dubs, RN Outcome: Progressing   Problem: Skin Integrity: Goal: Risk for impaired skin integrity will decrease Outcome: Progressing

## 2020-06-20 NOTE — Progress Notes (Signed)
Orthopedic Tech Progress Note Patient Details:  Jacqueline [name] 26-Jul-1928 [id number]  Patient ID: Jacqueline [name], female   DOB: 1929/03/04, 87 y.o.   MRN: [id number]   Jacqueline [name] 06/20/2020, 4:22 PMPick up [place] traction

## 2020-06-20 NOTE — Transfer of Care (Signed)
Immediate Anesthesia Transfer of Care Note  Patient: Jacqueline [name]  Procedure(s) Performed: Procedure(s): INTRAMEDULLARY (IM) NAIL FEMORAL (Left)  Patient Location: PACU  Anesthesia Type:General  Level of Consciousness: Alert, Awake, Oriented  Airway & Oxygen Therapy: Patient Spontanous Breathing  Post-op Assessment: Report given to RN  Post vital signs: Reviewed and stable  Last Vitals:  Vitals:   06/20/20 0542 06/20/20 0800  BP: (!) 107/58   Pulse: 74   Resp: 18   Temp: 36.7 C   SpO2: (!) 01% 75%    Complications: No apparent anesthesia complications

## 2020-06-20 NOTE — Anesthesia Preprocedure Evaluation (Addendum)
Anesthesia Evaluation  Patient identified by MRN, date of birth, ID band Patient confused    Reviewed: Allergy & Precautions, NPO status , Patient's Chart, lab work & pertinent test results  Airway Mallampati: I  TM Distance: <3 FB Neck ROM: Full    Dental  (+) Edentulous Upper, Edentulous Lower   Pulmonary former smoker,     + decreased breath sounds      Cardiovascular hypertension, Pt. on medications and Pt. on home beta blockers + CAD, + Peripheral Vascular Disease and +CHF  + dysrhythmias Atrial Fibrillation  Rhythm:Regular Rate:Normal     Neuro/Psych Anxiety TIACVA    GI/Hepatic Neg liver ROS, hiatal hernia, GERD  Medicated,  Endo/Other  negative endocrine ROS  Renal/GU Renal disease     Musculoskeletal  (+) Arthritis , Osteoarthritis,    Abdominal Normal abdominal exam  (+)   Peds  Hematology negative hematology ROS (+)   Anesthesia Other Findings   Reproductive/Obstetrics                           Anesthesia Physical Anesthesia Plan  ASA: III  Anesthesia Plan: General   Post-op Pain Management:    Induction: Intravenous  PONV Risk Score and Plan: 4 or greater and Ondansetron, Treatment may vary due to age or medical condition and Amisulpride  Airway Management Planned: Oral ETT  Additional Equipment: None  Intra-op Plan:   Post-operative Plan: Extubation in OR  Informed Consent: I have reviewed the patients History and Physical, chart, labs and discussed the procedure including the risks, benefits and alternatives for the proposed anesthesia with the patient or authorized representative who has indicated his/her understanding and acceptance.   Patient has DNR.  Suspend DNR.   Dental advisory given  Plan Discussed with: CRNA  Anesthesia Plan Comments: (Lab Results      Component                Value               Date                      WBC                       9.6                 06/20/2020                HGB                      12.0                06/20/2020                HCT                      36.6                06/20/2020                MCV                      96.3                06/20/2020                PLT  154                 06/20/2020            Lab Results      Component                Value               Date                      CREATININE               0.87                06/20/2020                BUN                      14                  06/20/2020                NA                       142                 06/20/2020                K                        4.3                 06/20/2020                CL                       105                 06/20/2020                CO2                      27                  06/20/2020             Attempted to contact son via phone. LVM for him to call OR if he had any questions. Will suspend DNR.   Echo:  1. The left ventricle has normal systolic function with an ejection  fraction of 60-65%. The cavity size was normal. There is mildly increased  left ventricular wall thickness. Left ventricular diastolic Doppler  parameters are consistent with impaired  relaxation. No evidence of left ventricular regional wall motion  abnormalities.  2. The right ventricle has normal systolic function. The cavity was  mildly enlarged. There is no increase in right ventricular wall thickness.  3. Left atrial size was severely dilated.  4. Mild calcification of the mitral valve leaflet. There is mild mitral  annular calcification present. No evidence of mitral valve stenosis.  Trivial mitral regurgitation.  5. The aortic valve is tricuspid. Moderate calcification of the aortic  valve. Mild stenosis of the aortic valve.  6. The aorta is normal unless otherwise noted.  7. The aortic root and ascending aorta are normal in size and structure.  8. Normal IVC size. PA  systolic pressure  53 mmHg. )      Anesthesia Quick Evaluation

## 2020-06-20 NOTE — Interval H&P Note (Signed)
History and Physical Interval Note:  06/20/2020 10:36 AM  Jacqueline [name]  has presented today for surgery, with the diagnosis of left femor fracture.  The various methods of treatment have been discussed with the patient and family. After consideration of risks, benefits and other options for treatment, the patient has consented to  Procedure(s): INTRAMEDULLARY (IM) NAIL FEMORAL (Left) as a surgical intervention.  The patient's history has been reviewed, patient examined, no change in status, stable for surgery.  I have reviewed the patient's chart and labs.  Questions were answered to the patient's satisfaction.     Hilton Cork Reanna Scoggin

## 2020-06-20 NOTE — Anesthesia Procedure Notes (Signed)
Procedure Name: Intubation Date/Time: 06/20/2020 11:39 AM Performed by: Gerald Leitz, CRNA Pre-anesthesia Checklist: Patient identified, Patient being monitored, Timeout performed, Emergency Drugs available and Suction available Patient Re-evaluated:Patient Re-evaluated prior to induction Oxygen Delivery Method: Circle system utilized Preoxygenation: Pre-oxygenation with 100% oxygen Induction Type: IV induction Ventilation: Mask ventilation without difficulty Laryngoscope Size: Mac and 3 Grade View: Grade I Tube type: Oral Tube size: 7.0 mm Number of attempts: 1 Placement Confirmation: ETT inserted through vocal cords under direct vision,  positive ETCO2 and breath sounds checked- equal and bilateral Secured at: 21 cm Tube secured with: Tape Dental Injury: Teeth and Oropharynx as per pre-operative assessment

## 2020-06-21 LAB — MAGNESIUM: Magnesium: 2 mg/dL (ref 1.7–2.4)

## 2020-06-21 LAB — RENAL FUNCTION PANEL
Albumin: 2.6 g/dL — ABNORMAL LOW (ref 3.5–5.0)
Anion gap: 10 (ref 5–15)
BUN: 16 mg/dL (ref 8–23)
CO2: 26 mmol/L (ref 22–32)
Calcium: 8.2 mg/dL — ABNORMAL LOW (ref 8.9–10.3)
Chloride: 105 mmol/L (ref 98–111)
Creatinine, Ser: 0.73 mg/dL (ref 0.44–1.00)
GFR, Estimated: >60 mL/min (ref >60)
Glucose, Bld: 145 mg/dL — ABNORMAL HIGH (ref 70–99)
Phosphorus: 3.7 mg/dL (ref 2.5–4.6)
Potassium: 3.6 mmol/L (ref 3.5–5.1)
Sodium: 141 mmol/L (ref 135–145)

## 2020-06-21 LAB — HEMOGLOBIN AND HEMATOCRIT, BLOOD
HCT: 26.6 % — ABNORMAL LOW (ref 36.0–46.0)
Hemoglobin: 8.5 g/dL — ABNORMAL LOW (ref 12.0–15.0)

## 2020-06-21 LAB — CBC
HCT: 26.2 % — ABNORMAL LOW (ref 36.0–46.0)
Hemoglobin: 8.5 g/dL — ABNORMAL LOW (ref 12.0–15.0)
MCH: 31.7 pg (ref 26.0–34.0)
MCHC: 32.4 g/dL (ref 30.0–36.0)
MCV: 97.8 fL (ref 80.0–100.0)
Platelets: 148 10*3/uL — ABNORMAL LOW (ref 150–400)
RBC: 2.68 MIL/uL — ABNORMAL LOW (ref 3.87–5.11)
RDW: 14.7 % (ref 11.5–15.5)
WBC: 8.5 10*3/uL (ref 4.0–10.5)
nRBC: 0 % (ref 0.0–0.2)

## 2020-06-21 LAB — GLUCOSE, CAPILLARY: Glucose-Capillary: 115 mg/dL — ABNORMAL HIGH (ref 70–99)

## 2020-06-21 LAB — BRAIN NATRIURETIC PEPTIDE: B Natriuretic Peptide: 437.4 pg/mL — ABNORMAL HIGH (ref 0.0–100.0)

## 2020-06-21 LAB — CORTISOL-AM, BLOOD: Cortisol - AM: 2.7 ug/dL — ABNORMAL LOW (ref 6.7–22.6)

## 2020-06-21 LAB — LACTIC ACID, PLASMA
Lactic Acid, Venous: 1.6 mmol/L (ref 0.5–1.9)
Lactic Acid, Venous: 1.8 mmol/L (ref 0.5–1.9)

## 2020-06-21 LAB — TSH: TSH: 0.44 u[IU]/mL (ref 0.350–4.500)

## 2020-06-21 MED ORDER — ENSURE ENLIVE PO LIQD
237.0000 mL | Freq: Two times a day (BID) | ORAL | Status: DC
  Administered 2020-06-22 – 2020-06-25 (×6): 237 mL via ORAL

## 2020-06-21 MED ORDER — SENNOSIDES-DOCUSATE SODIUM 8.6-50 MG PO TABS: 1.0000 | ORAL_TABLET | Freq: Two times a day (BID) | ORAL | Status: DC | PRN

## 2020-06-21 MED ORDER — PROSOURCE PLUS PO LIQD
30.0000 mL | Freq: Every day | ORAL | Status: DC
  Administered 2020-06-22 – 2020-06-24 (×3): 30 mL via ORAL
  Filled 2020-06-21 (×4): qty 30

## 2020-06-21 MED ORDER — POLYETHYLENE GLYCOL 3350 17 G PO PACK: 17.0000 g | PACK | Freq: Two times a day (BID) | ORAL | Status: DC | PRN

## 2020-06-21 NOTE — Progress Notes (Signed)
PROGRESS NOTE  Tracia Lacomb FUX:[id number] DOB: 11-19-1928   PCP: Lajean Manes, MD  Patient is from: SNF  DOA: 06/19/2020 LOS: 2  Chief complaints: Left thigh pain  Brief Narrative / Interim history: 87 year old F with PMH of A. fib on Eliquis, dementia, HTN, anxiety and GERD brought to ED from SNF with left thigh pain. She heard a loud pop when she pivoted on left leg for transfer.  X-ray and CT showed displaced, foreshortened and angulated left femoral shaft fracture concerning for pathologic fracture.   Orthopedic surgery consulted.  CT chest, abdomen and pelvis revealed additional aggressive bone lesions (T8, left third rib, L2, upper right ilium and left ilium) with no primary malignancy within chest or abdomen.  She underwent intramedullary fixation of left femoral by Dr. Lyla Glassing on 2/19.  Oncology, Dr. Irene Limbo recommended myeloma panel, light chains and outpatient follow-up after pathology result.   Subjective: Seen and examined earlier this morning.  No major events overnight of this morning.  She denies pain, shortness of breath, nausea, vomiting or abdominal pain but not a reliable historian.  She is oriented to self, place and situation but not time.  Objective: Vitals:   06/20/20 2254 06/21/20 0216 06/21/20 0707 06/21/20 1320  BP: 93/69 114/70 (!) 108/59 (!) 84/48  Pulse: 96 99 86 (!) 104  Resp: _0 Temp: 97.7 F (36.5 C) 97.7 F (36.5 C) (!) 97.5 F (36.4 C) 97.8 F (36.6 C)  TempSrc: Oral Oral Oral Axillary  SpO2: 98% 98% 96% 97%  Weight:      Height:        Intake/Output Summary (Last 24 hours) at 06/21/2020 1409 Last data filed at 06/21/2020 0356 Gross per 24 hour  Intake 1922.22 ml  Output -  Net 1922.22 ml   Filed Weights   06/20/20 0811  Weight: 65.1 kg    Examination:  GENERAL: No apparent distress.  Nontoxic. HEENT: MMM.  Vision and hearing grossly intact.  NECK: Supple.  No apparent JVD.  RESP: 96% on 2 L.  No IWOB.  Fair aeration  bilaterally. CVS:  RRR. Heart sounds normal.  ABD/GI/GU: BS+. Abd soft, NTND.  MSK/EXT:  Moves extremities.  Dressing over left thigh DCI.  No bruising or hematoma. SKIN: no apparent skin lesion or wound NEURO: Awake.  Oriented to self, place and situation.  No apparent focal neuro deficit. PSYCH: Calm. Normal affect.  Procedures:  2/19-intramedullary fixation of left femoral shaft fracture  Microbiology summarized: COVID-19 PCR nonreactive. MRSA PCR screen nonreactive.  Assessment & Plan: Displaced and angulated left femoral shaft fracture-likely pathologic given additional aggressive bone lesions elsewhere as noted above.  She has elevated alk phos with normal GGT.  -S/p intramedullary fixation of left humeral shaft fracture -Pain control and VTE prophylaxis per orthopedic surgery -Oncology, Dr. Irene Limbo recs: myeloma panel,  light chains, surgical pathology and outpatient follow-up -Bowel regimen.  Hypotension: Somewhat hypotensive again this afternoon.  No signs of infection.  TTE in 2020 with normal LVEF.  Hgb down about 2.5 g partly due to hemodilution. -Asked RN to recheck BP manually. -Continue holding home Cardizem, metoprolol and Lasix. -Continue IV fluid -Check TSH, cortisol and lactic acid  Paroxysmal A. fib: Rate controlled. -Home Cardizem and metoprolol on hold. -Continue home Eliquis -Optimize K and MG  Elevated alk phos with normal GGT-likely osseous source -Evaluation as above  Hypokalemia/hypomagnesemia: Hypokalemia resolved -Monitor and replenish aggressively  Hyperglycemia without diagnosis of diabetes: A1c 5.2%.  Resolved.  Lactic acidosis: Likely  mediated by hypotension.  No evidence of infection.  Resolved.  Dementia without behavioral disturbance -Reorientation and delirium precautions.  Anxiety-takes Xanax at home. -Continue reduced dose of this  Goal of care/DNR/DNI-appropriate. -Further goal of care discussion based on clinical course and  pathology finding  Body mass index is 27.11 kg/m.         DVT prophylaxis:  apixaban (ELIQUIS) tablet 2.5 mg Start: 06/21/20 1000 SCDs Start: 06/20/20 1602SCD apixaban (ELIQUIS) tablet 2.5 mg  apixaban (ELIQUIS) tablet 5 mg  Code Status: DNR/DNI Family Communication: Updated patient's son over the phone on 02/19.  Did not answer on 2/20. Level of care: Progressive Status is: Inpatient  Remains inpatient appropriate because:Hemodynamically unstable, Ongoing diagnostic testing needed not appropriate for outpatient work up, Unsafe d/c plan, IV treatments appropriate due to intensity of illness or inability to take PO and Inpatient level of care appropriate due to severity of illness   Dispo: The patient is from: SNF              Anticipated d/c is to: SNF              Anticipated d/c date is: > 3 days              Patient currently is not medically stable to d/c.   Difficult to place patient No       Consultants:  Orthopedic surgery Oncology over the phone   Sch Meds:  Scheduled Meds: . apixaban  2.5 mg Oral BID   Followed by  . [START ON 06/23/2020] apixaban  5 mg Oral BID  . Chlorhexidine Gluconate Cloth  6 each Topical Daily  . docusate sodium  100 mg Oral BID  . pantoprazole  40 mg Oral q AM   Continuous Infusions: . sodium chloride 100 mL/hr at 06/21/20 0838   PRN Meds:.acetaminophen, ALPRAZolam, HYDROcodone-acetaminophen, HYDROcodone-acetaminophen, HYDROcodone-acetaminophen, menthol-cetylpyridinium **OR** phenol, morphine injection, ondansetron **OR** ondansetron (ZOFRAN) IV, polyvinyl alcohol  Antimicrobials: Anti-infectives (From admission, onward)   Start     Dose/Rate Route Frequency Ordered Stop   06/20/20 1800  ceFAZolin (ANCEF) IVPB 2g/100 mL premix        2 g 200 mL/hr over 30 Minutes Intravenous Every 6 hours 06/20/20 1602 06/20/20 2342   06/20/20 1650  ceFAZolin (ANCEF) 2-4 GM/100ML-% IVPB       Note to Pharmacy: Dara Lords   : cabinet override       06/20/20 1650 06/21/20 0459   06/20/20 1203  ceFAZolin (ANCEF) 2-4 GM/100ML-% IVPB       Note to Pharmacy: Bernadene Person   : cabinet override      06/20/20 1203 06/20/20 1343   06/20/20 0600  ceFAZolin (ANCEF) IVPB 2g/100 mL premix        2 g 200 mL/hr over 30 Minutes Intravenous On call to O.R. 06/19/20 2027 06/20/20 1217       I have personally reviewed the following labs and images: CBC: Recent Labs  Lab 06/19/20 1154 06/19/20 1605 06/19/20 2145 06/20/20 0631 06/20/20 1405 06/21/20 0433  WBC 13.8*  --   --  9.6  --  8.5  NEUTROABS 12.3*  --   --   --   --   --   HGB 13.3 10.9* 11.4* 12.0 9.8* 8.5*  HCT 40.3 33.9* 34.8* 36.6 30.1* 26.2*  MCV 95.0  --   --  96.3  --  97.8  PLT 164  --   --  154  --  148*  BMP &GFR Recent Labs  Lab 06/19/20 1154 06/19/20 1803 06/20/20 0631 06/21/20 0433  NA 142  --  142 141  K 2.9*  --  4.3 3.6  CL 97*  --  105 105  CO2 29  --  27 26  GLUCOSE 174*  --  98 145*  BUN 14  --  14 16  CREATININE 0.94  --  0.87 0.73  CALCIUM 8.8*  --  8.4* 8.2*  MG  --  1.5*  --  2.0  PHOS  --   --   --  3.7   Estimated Creatinine Clearance: 41.2 mL/min (by C-G formula based on SCr of 0.73 mg/dL). Liver & Pancreas: Recent Labs  Lab 06/19/20 1154 06/21/20 0433  AST 18  --   ALT 8  --   ALKPHOS 233*  --   BILITOT 1.3*  --   PROT 6.5  --   ALBUMIN 3.0* 2.6*   No results for input(s): LIPASE, AMYLASE in the last 168 hours. No results for input(s): AMMONIA in the last 168 hours. Diabetic: Recent Labs    06/19/20 1803  HGBA1C 5.2   No results for input(s): GLUCAP in the last 168 hours. Cardiac Enzymes: No results for input(s): CKTOTAL, CKMB, CKMBINDEX, TROPONINI in the last 168 hours. No results for input(s): PROBNP in the last 8760 hours. Coagulation Profile: No results for input(s): INR, PROTIME in the last 168 hours. Thyroid Function Tests: No results for input(s): TSH, T4TOTAL, FREET4, T3FREE, THYROIDAB in the last 72  hours. Lipid Profile: No results for input(s): CHOL, HDL, LDLCALC, TRIG, CHOLHDL, LDLDIRECT in the last 72 hours. Anemia Panel: No results for input(s): VITAMINB12, FOLATE, FERRITIN, TIBC, IRON, RETICCTPCT in the last 72 hours. Urine analysis:    Component Value Date/Time   COLORURINE YELLOW 06/19/2020 2115   APPEARANCEUR CLEAR 06/19/2020 2115   LABSPEC 1.018 06/19/2020 2115   PHURINE 6.0 06/19/2020 2115   GLUCOSEU NEGATIVE 06/19/2020 2115   HGBUR NEGATIVE 06/19/2020 2115   BILIRUBINUR NEGATIVE 06/19/2020 2115   KETONESUR NEGATIVE 06/19/2020 2115   PROTEINUR NEGATIVE 06/19/2020 2115   UROBILINOGEN 0.2 06/18/2014 1950   NITRITE NEGATIVE 06/19/2020 2115   LEUKOCYTESUR NEGATIVE 06/19/2020 2115   Sepsis Labs: Invalid input(s): PROCALCITONIN, [place]  Microbiology: Recent Results (from the past 240 hour(s))  SARS CORONAVIRUS 2 (TAT 6-24 HRS) Nasopharyngeal Nasopharyngeal Swab     Status: None   Collection Time: 06/19/20 11:55 AM   Specimen: Nasopharyngeal Swab  Result Value Ref Range Status   SARS Coronavirus 2 NEGATIVE NEGATIVE Final    Comment: (NOTE) SARS-CoV-2 target nucleic acids are NOT DETECTED.  The SARS-CoV-2 RNA is generally detectable in upper and lower respiratory specimens during the acute phase of infection. Negative results do not preclude SARS-CoV-2 infection, do not rule out co-infections with other pathogens, and should not be used as the sole basis for treatment or other patient management decisions. Negative results must be combined with clinical observations, patient history, and epidemiological information. The expected result is Negative.  Fact Sheet for Patients: SugarRoll.be  Fact Sheet for Healthcare Providers: https://www.woods-mathews.com/  This test is not yet approved or cleared by the [place] FDA and  has been authorized for detection and/or diagnosis of SARS-CoV-2 by FDA under an Emergency  Use Authorization (EUA). This EUA will remain  in effect (meaning this test can be used) for the duration of the COVID-19 declaration under Se ction 564(b)(1) of the Act, 21 U.S.C. section 360bbb-3(b)(1), unless the authorization is terminated or  revoked sooner.  Performed at [facility], [place] [address], [place], [place] [address]   MRSA PCR Screening     Status: None   Collection Time: 06/19/20  9:15 PM   Specimen: Nasal Mucosa; Nasopharyngeal  Result Value Ref Range Status   MRSA by PCR NEGATIVE NEGATIVE Final    Comment:        The GeneXpert MRSA Assay (FDA approved for NASAL specimens only), is one component of a comprehensive MRSA colonization surveillance program. It is not intended to diagnose MRSA infection nor to guide or monitor treatment for MRSA infections. Performed at [facility], [place] [address], [place], [place] [address]     Radiology Studies: DG FEMUR PORT MIN 2 VIEWS LEFT  Result Date: 06/20/2020 CLINICAL DATA:  87 year old female status post left hip ORIF. EXAM: LEFT FEMUR PORTABLE 2 VIEWS COMPARISON:  None. FINDINGS: Status post ORIF of the left femoral diaphysis fracture. The hardware is intact. The bones are osteopenic. 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 Postsurgical changes of the soft tissues of the left hip and cutaneous clips. IMPRESSION: Status post ORIF of the left femoral diaphysis fracture. Electronically Signed   By: Anner Crete M.D.   On: 06/20/2020 15:04     Kaniel Kiang T. Stanley  If 7PM-7AM, please contact night-coverage [email or web address] 06/21/2020, 2:09 PM

## 2020-06-21 NOTE — Evaluation (Signed)
Physical Therapy Evaluation Patient Details Name: Jacqueline [name] MRN: [id number] DOB: 13-Oct-1928 Today's Date: 06/21/2020   History of Present Illness  Pt admitted from [facility] s/p fall wtih L femur pathologic fx and now s/p IM nail.  Pt with hx of dementia, a-fib, anxiety, CVA, and PAD  Clinical Impression  Pt admitted as above and presenting with functional mobility limitations 2* generalized weakness, balance deficits, post op pain and dementia related cognitive deficits.  Pt would benefit from follow up rehab at SNF level to maximize IND and safety.   Follow Up Recommendations SNF    Equipment Recommendations  None recommended by PT    Recommendations for Other Services       Precautions / Restrictions Precautions Precautions: Fall Restrictions Weight Bearing Restrictions: No LLE Weight Bearing: Weight bearing as tolerated      Mobility  Bed Mobility Overal bed mobility: Needs Assistance Bed Mobility: Supine to Sit;Sit to Supine     Supine to sit: Max assist;+2 for physical assistance;+2 for safety/equipment Sit to supine: Max assist;+2 for physical assistance;+2 for safety/equipment   General bed mobility comments: Pt reluctant to move L LE so assisted with pad to/from EOB sitting.    Transfers                 General transfer comment: Pt to EOB sitting only and requiring min assist to min guard to maintain sitting balance.  Pt holding bil LEs out in front of her and unwilling/unable to bend knees to pull feet under - standing not attempted.  Ambulation/Gait                Stairs            Wheelchair Mobility    Modified Rankin (Stroke Patients Only)       Balance Overall balance assessment: Needs assistance Sitting-balance support: Bilateral upper extremity supported Sitting balance-Leahy Scale: Poor Sitting balance - Comments: posterior drift                                     Pertinent Vitals/Pain Pain  Assessment: Faces Faces Pain Scale: Hurts little more Pain Location: L LE with movement Pain Descriptors / Indicators: Grimacing;Guarding Pain Intervention(s): Limited activity within patient's tolerance;Monitored during session    Home Living Family/patient expects to be discharged to:: Skilled nursing facility                      Prior Function           Comments: no family present to provide information on pt PLOF; pt unable to provide 2* dementia     Hand Dominance        Extremity/Trunk Assessment   Upper Extremity Assessment Upper Extremity Assessment: Generalized weakness    Lower Extremity Assessment Lower Extremity Assessment: Generalized weakness;LLE deficits/detail LLE: Unable to fully assess due to pain    Cervical / Trunk Assessment Cervical / Trunk Assessment: Kyphotic  Communication   Communication: HOH  Cognition Arousal/Alertness: Awake/alert Behavior During Therapy: WFL for tasks assessed/performed Overall Cognitive Status: History of cognitive impairments - at baseline                                 General Comments: Pt very pleasant and knows she is "at [facility]" but with no further insight into current situation  General Comments      Exercises     Assessment/Plan    PT Assessment Patient needs continued PT services  PT Problem List Decreased strength;Decreased range of motion;Decreased activity tolerance;Decreased balance;Decreased mobility;Decreased cognition;Decreased safety awareness;Decreased knowledge of use of DME;Pain       PT Treatment Interventions DME instruction;Gait training;Functional mobility training;Therapeutic activities;Therapeutic exercise;Balance training;Cognitive remediation;Patient/family education    PT Goals (Current goals can be found in the Care Plan section)  Acute Rehab PT Goals Patient Stated Goal: No goals stated PT Goal Formulation: Patient unable to participate in  goal setting Potential to Achieve Goals: Fair    Frequency Min 2X/week   Barriers to discharge        Co-evaluation               AM-PAC PT "6 Clicks" Mobility  Outcome Measure Help needed turning from your back to your side while in a flat bed without using bedrails?: A Lot Help needed moving from lying on your back to sitting on the side of a flat bed without using bedrails?: A Lot Help needed moving to and from a bed to a chair (including a wheelchair)?: Total Help needed standing up from a chair using your arms (e.g., wheelchair or bedside chair)?: Total Help needed to walk in hospital room?: Total Help needed climbing 3-5 steps with a railing? : Total 6 Click Score: 8    End of Session Equipment Utilized During Treatment: Gait belt Activity Tolerance: Patient limited by fatigue Patient left: in bed;with call bell/phone within reach;with bed alarm set Nurse Communication: Mobility status PT Visit Diagnosis: Muscle weakness (generalized) (M62.81);Difficulty in walking, not elsewhere classified (R26.2);Pain Pain - Right/Left: Left Pain - part of body: Hip    Time: 1227-1248 PT Time Calculation (min) (ACUTE ONLY): 21 min   Charges:   PT Evaluation $PT Eval Moderate Complexity: [place] Pager [phone] Office [phone]   [facility] 06/21/2020, 4:23 PM

## 2020-06-21 NOTE — Progress Notes (Signed)
Pt Bp - 86/54  Yellow MEWS MD notified. Awaiting orders Will continue to monitor.

## 2020-06-21 NOTE — Progress Notes (Signed)
Patient ID: Jacqueline [name], female   DOB: Oct 26, 1928, 87 y.o.   MRN: [id number] Subjective: 1 Day Post-Op Procedure(s) (LRB): INTRAMEDULLARY (IM) NAIL FEMORAL (Left)    Patient reports pain as mild but just waking up this am. No OOB activity as of yet   Objective:   VITALS:   Vitals:   06/21/20 0216 06/21/20 0707  BP: 114/70 (!) 108/59  Pulse: 99 86  Resp: 16 16  Temp: 97.7 F (36.5 C) (!) 97.5 F (36.4 C)  SpO2: 98% 96%    Neurovascular intact Incision: dressing C/D/I - left thigh dressings  LABS Recent Labs    06/19/20 1154 06/19/20 1605 06/20/20 0631 06/20/20 1405 06/21/20 0433  HGB 13.3   < > 12.0 9.8* 8.5*  HCT 40.3   < > 36.6 30.1* 26.2*  WBC 13.8*  --  9.6  --  8.5  PLT 164  --  154  --  148*   < > = values in this interval not displayed.    Recent Labs    06/19/20 1154 06/20/20 0631 06/21/20 0433  NA 142 142 141  K 2.9* 4.3 3.6  BUN 14 14 16   CREATININE 0.94 0.87 0.73  GLUCOSE 174* 98 145*    No results for input(s): LABPT, INR in the last 72 hours.   Assessment/Plan: 1 Day Post-Op Procedure(s) (LRB): INTRAMEDULLARY (IM) NAIL FEMORAL (Left)   Advance diet Up with therapy as directed by [place] Disposition based on pre-op baseline and therapy assessment  DVT prophylaxis per post op orders

## 2020-06-21 NOTE — Progress Notes (Addendum)
Initial Nutrition Assessment  RD working remotely.  DOCUMENTATION CODES:   Not applicable  INTERVENTION:  - will order Ensure Enlive BID, each supplement provides 350 kcal and 20 grams of protein. - will order 30 ml Prosource Plus once/day, each supplement provides 100 kcal and 15 grams protein.  - complete NFPE when feasible.    NUTRITION DIAGNOSIS:   Increased nutrient needs related to acute illness,catabolic illness as evidenced by estimated needs.  GOAL:   Patient will meet greater than or equal to 90% of their needs  MONITOR:   PO intake,Supplement acceptance,Labs,Weight trends  REASON FOR ASSESSMENT:   Consult Hip fracture protocol  ASSESSMENT:   87 year old female with medical history of afib, dementia, HTN, anxiety, and GERD. She presented to the ED from SNF due to L thigh pain. Xray and CT showed displaced L femoral shaft fracture concerning for pathologic fracture. Orthopedic surgery consulted.  CT chest, abdomen and pelvis revealed additional aggressive bone lesions (T8, left third rib, L2, upper right ilium and left ilium) with no primary malignancy within chest or abdomen.  Diet advanced from NPO to Regular yesterday at 1602; no intakes documented since that time.   Weight yesterday was 143 lb and weight on 12/27/19 was 176 lb. This indicates 33 lb weight loss (19% body weight) in the past 6 months; significant for time frame.   Suspect some degree of malnutrition, but unable to confirm without completing NFPE.   POD #1 IM fixation of L femoral fx. Oncology was consulted d/t findings and myeloma panel was recommended.   She is from SNF and plan is to return to SNF when stable for d/c.   Labs reviewed; Ca: 8.2 mg/dl. Medications reviewed; 100 mg colace BID, 2 g IV Mg sulfate x1 run 2/18, 10 mEq IV KCl x4 runs 2/18.    NUTRITION - FOCUSED PHYSICAL EXAM:  unable to complete at this time.   Diet Order:   Diet Order            Diet regular Room service  appropriate? Yes; Fluid consistency: Thin  Diet effective now                 EDUCATION NEEDS:   Not appropriate for education at this time  Skin:  Skin Assessment: Skin Integrity Issues: Skin Integrity Issues:: Incisions Incisions: L hip (2/19)  Last BM:  PTA/unknown  Height:   Ht Readings from Last 1 Encounters:  06/20/20 5\' 1"  (1.549 m)    Weight:   Wt Readings from Last 1 Encounters:  06/20/20 65.1 kg    Estimated Nutritional Needs:  Kcal:  1700-1900 kcal Protein:  80-95 grams Fluid:  >/= 1.8 L/day      Jarome Matin, MS, RD, LDN, CNSC Inpatient Clinical Dietitian RD pager # available in AMION  After hours/weekend pager # available in [facility]

## 2020-06-22 ENCOUNTER — Inpatient Hospital Stay (HOSPITAL_COMMUNITY): Payer: Medicare Other

## 2020-06-22 DIAGNOSIS — I9589 Other hypotension: Secondary | ICD-10-CM

## 2020-06-22 DIAGNOSIS — E2740 Unspecified adrenocortical insufficiency: Secondary | ICD-10-CM

## 2020-06-22 DIAGNOSIS — N179 Acute kidney failure, unspecified: Secondary | ICD-10-CM

## 2020-06-22 LAB — CBC
HCT: 28.4 % — ABNORMAL LOW (ref 36.0–46.0)
Hemoglobin: 8.6 g/dL — ABNORMAL LOW (ref 12.0–15.0)
MCH: 31.4 pg (ref 26.0–34.0)
MCHC: 30.3 g/dL (ref 30.0–36.0)
MCV: 103.6 fL — ABNORMAL HIGH (ref 80.0–100.0)
Platelets: 170 10*3/uL (ref 150–400)
RBC: 2.74 MIL/uL — ABNORMAL LOW (ref 3.87–5.11)
RDW: 15.1 % (ref 11.5–15.5)
WBC: 10.1 10*3/uL (ref 4.0–10.5)
nRBC: 0.2 % (ref 0.0–0.2)

## 2020-06-22 LAB — RENAL FUNCTION PANEL
Albumin: 2.7 g/dL — ABNORMAL LOW (ref 3.5–5.0)
Anion gap: 11 (ref 5–15)
BUN: 25 mg/dL — ABNORMAL HIGH (ref 8–23)
CO2: 23 mmol/L (ref 22–32)
Calcium: 8.4 mg/dL — ABNORMAL LOW (ref 8.9–10.3)
Chloride: 106 mmol/L (ref 98–111)
Creatinine, Ser: 1.2 mg/dL — ABNORMAL HIGH (ref 0.44–1.00)
GFR, Estimated: 43 mL/min — ABNORMAL LOW (ref >60)
Glucose, Bld: 120 mg/dL — ABNORMAL HIGH (ref 70–99)
Phosphorus: 3.4 mg/dL (ref 2.5–4.6)
Potassium: 3.8 mmol/L (ref 3.5–5.1)
Sodium: 140 mmol/L (ref 135–145)

## 2020-06-22 LAB — ECHOCARDIOGRAM COMPLETE
Area-P 1/2: 2.36 cm2
Height: 61 in
Weight: 2296 oz

## 2020-06-22 LAB — CORTISOL-AM, BLOOD: Cortisol - AM: 4.4 ug/dL — ABNORMAL LOW (ref 6.7–22.6)

## 2020-06-22 LAB — MAGNESIUM: Magnesium: 2 mg/dL (ref 1.7–2.4)

## 2020-06-22 MED ORDER — HYDROCORTISONE NA SUCCINATE PF 100 MG IJ SOLR
50.0000 mg | Freq: Every day | INTRAMUSCULAR | Status: DC
  Filled 2020-06-22: qty 1

## 2020-06-22 MED ORDER — HYDROCORTISONE NA SUCCINATE PF 100 MG IJ SOLR
50.0000 mg | Freq: Three times a day (TID) | INTRAMUSCULAR | Status: AC
Start: 2020-06-23 — End: 2020-06-24
  Administered 2020-06-23 – 2020-06-24 (×3): 50 mg via INTRAVENOUS
  Filled 2020-06-22 (×4): qty 1

## 2020-06-22 MED ORDER — HYDROCODONE-ACETAMINOPHEN 5-325 MG PO TABS: 1.0000 | ORAL_TABLET | ORAL | 0 refills | Status: AC | PRN

## 2020-06-22 MED ORDER — ACETAMINOPHEN 500 MG PO TABS
1000.0000 mg | ORAL_TABLET | Freq: Three times a day (TID) | ORAL | Status: DC
  Administered 2020-06-22 – 2020-06-25 (×7): 1000 mg via ORAL
  Filled 2020-06-22 (×7): qty 2

## 2020-06-22 MED ORDER — OXYCODONE HCL 5 MG PO TABS
5.0000 mg | ORAL_TABLET | Freq: Four times a day (QID) | ORAL | Status: DC | PRN
  Administered 2020-06-22 – 2020-06-25 (×4): 5 mg via ORAL
  Filled 2020-06-22 (×4): qty 1

## 2020-06-22 MED ORDER — APIXABAN 2.5 MG PO TABS
2.5000 mg | ORAL_TABLET | Freq: Two times a day (BID) | ORAL | Status: AC
  Administered 2020-06-22: 2.5 mg via ORAL
  Filled 2020-06-22: qty 1

## 2020-06-22 MED ORDER — HYDROCORTISONE NA SUCCINATE PF 100 MG IJ SOLR
100.0000 mg | Freq: Once | INTRAMUSCULAR | Status: AC
  Administered 2020-06-22: 100 mg via INTRAVENOUS
  Filled 2020-06-22: qty 2

## 2020-06-22 MED ORDER — HYDROCORTISONE NA SUCCINATE PF 100 MG IJ SOLR
50.0000 mg | Freq: Two times a day (BID) | INTRAMUSCULAR | Status: AC
  Administered 2020-06-24 – 2020-06-25 (×2): 50 mg via INTRAVENOUS
  Filled 2020-06-22 (×2): qty 1

## 2020-06-22 MED ORDER — APIXABAN 5 MG PO TABS
5.0000 mg | ORAL_TABLET | Freq: Two times a day (BID) | ORAL | Status: DC
  Administered 2020-06-23: 5 mg via ORAL
  Filled 2020-06-22: qty 1

## 2020-06-22 MED ORDER — HYDROCORTISONE NA SUCCINATE PF 100 MG IJ SOLR
50.0000 mg | Freq: Four times a day (QID) | INTRAMUSCULAR | Status: AC
  Administered 2020-06-22 – 2020-06-23 (×4): 50 mg via INTRAVENOUS
  Filled 2020-06-22: qty 2
  Filled 2020-06-22 (×2): qty 1
  Filled 2020-06-22: qty 2

## 2020-06-22 NOTE — Progress Notes (Signed)
  Echocardiogram 2D Echocardiogram has been performed.  Jennette Dubin 06/22/2020, 3:30 PM

## 2020-06-22 NOTE — Care Management Important Message (Signed)
Important Message  Patient Details IM Letter given to the Patient. Name: Jacqueline [name] MRN: [id number] Date of Birth: May 11, 1928   Medicare Important Message Given:  Yes     Kerin Salen 06/22/2020, 12:08 PM

## 2020-06-22 NOTE — Progress Notes (Signed)
PROGRESS NOTE  Jacqueline [name] ZOX:[id number] DOB: 05/30/1928   PCP: Lajean Manes, MD  Patient is from: SNF  DOA: 06/19/2020 LOS: 3  Chief complaints: Left thigh pain  Brief Narrative / Interim history: 87 year old F with PMH of A. fib on Eliquis, dementia, HTN, anxiety and GERD brought to ED from SNF with left thigh pain. She heard a loud pop when she pivoted on left leg for transfer.  X-ray and CT showed displaced, foreshortened and angulated left femoral shaft fracture concerning for pathologic fracture.   Orthopedic surgery consulted.  CT chest, abdomen and pelvis revealed additional aggressive bone lesions (T8, left third rib, L2, upper right ilium and left ilium) with no primary malignancy within chest or abdomen.  She underwent intramedullary fixation of left femoral by Dr. Lyla Glassing on 2/19.  Oncology, Dr. Irene Limbo recommended myeloma panel, light chains and outpatient follow-up after pathology result.   Hospital course noteworthy of delirium, hypotension and arterial insufficiency.  Subjective: Seen and examined earlier this morning.  No major events overnight or this morning.  Confused and pulled his IV lines.  She has mittens on both hands.  Only oriented to self but follows commands.  She says she has pain all over.   Objective: Vitals:   06/21/20 1320 06/21/20 1450 06/22/20 0433 06/22/20 0855  BP: (!) 84/48 (!) 86/54 (!) 106/57 93/65  Pulse: (!) 104  88 (!) 116  Resp: '17  18 16  ' Temp: 97.8 F (36.6 C)  97.7 F (36.5 C)   TempSrc: Axillary  Oral   SpO2: 97%  97% (!) 83%  Weight:      Height:        Intake/Output Summary (Last 24 hours) at 06/22/2020 1209 Last data filed at 06/21/2020 1300 Gross per 24 hour  Intake 350.17 ml  Output 350 ml  Net 0.17 ml   Filed Weights   06/20/20 0811  Weight: 65.1 kg    Examination:  GENERAL: No apparent distress.  Nontoxic. HEENT: MMM.  Vision and hearing grossly intact.  NECK: Supple.  No apparent JVD.  RESP: On RA.  No IWOB.   Fair aeration bilaterally. CVS:  RRR. Heart sounds normal.  ABD/GI/GU: BS+. Abd soft, NTND.  MSK/EXT:  Moves extremities. No apparent deformity. No edema.  Mittens over both hands. SKIN: Dressing over left thigh DCI.  No bruising or hematoma. NEURO: Awake.  Oriented only to self but pleasantly confused.  Follows commands.  No apparent focal neuro deficit. PSYCH: Calm.  No distress or agitation.  Procedures:  2/19-intramedullary fixation of left femoral shaft fracture  Microbiology summarized: COVID-19 PCR nonreactive. MRSA PCR screen nonreactive.  Assessment & Plan: Displaced and angulated left femoral shaft fracture-likely pathologic given additional aggressive bone lesions elsewhere as noted above.  She has elevated alk phos with normal GGT.  -S/p intramedullary fixation of left humeral shaft fracture -Scheduled Tylenol with as needed oxycodone and IV morphine for pain control -Already on Eliquis in regards to VTE prophylaxis -Oncology, Dr. Irene Limbo recs: myeloma panel,  light chains, surgical pathology and outpatient follow-up -Bowel regimen.  Hypotension: Likely due to adrenal insufficiency.  Random cortisol 2.7.  A.m. cortisol 4.4.  Hgb down about 2.5 g but stable now.  TSH and lactic acid within normal. -Check ACTH. -Started stress dose hydrocortisone -Continue holding home Cardizem, metoprolol and Lasix. -Continue IV fluid -Check TTE  AKI: Likely prerenal from hypotension. -Treatment as above -Avoid nephrotoxic meds -Continue monitoring  Paroxysmal A. fib: Rate controlled. -Home Cardizem and metoprolol on hold. -  Continue home Eliquis -Optimize K and MG  Elevated alk phos with normal GGT-likely osseous source -Evaluation as above  Hypokalemia/hypomagnesemia: Hypokalemia resolved -Monitor and replenish aggressively  Hyperglycemia without diagnosis of diabetes: A1c 5.2%.  Resolved.  Lactic acidosis: Likely mediated by hypotension.  No evidence of infection.   Resolved.  Dementia with behavioral disturbance/delirium -Reorientation and delirium precautions. -Medications reviewed and adjusted.  Anxiety-takes Xanax at home. -Continue reduced dose of this  Goal of care/DNR/DNI-appropriate. -Further goal of care discussion based on clinical course and pathology finding  Body mass index is 27.11 kg/m. Nutrition Problem: Increased nutrient needs Etiology: acute illness,catabolic illness Signs/Symptoms: estimated needs Interventions: Ensure Enlive (each supplement provides 350kcal and 20 grams of protein),Prostat   DVT prophylaxis:  apixaban (ELIQUIS) tablet 2.5 mg Start: 06/22/20 2200 SCDs Start: 06/20/20 1602SCD apixaban (ELIQUIS) tablet 2.5 mg  apixaban (ELIQUIS) tablet 5 mg  Code Status: DNR/DNI Family Communication: Updated patient's son over the phone on 02/19.  Did not answer on 2/20 and 21. Level of care: Progressive Status is: Inpatient  Remains inpatient appropriate because:Hemodynamically unstable, Ongoing diagnostic testing needed not appropriate for outpatient work up, Unsafe d/c plan, IV treatments appropriate due to intensity of illness or inability to take PO and Inpatient level of care appropriate due to severity of illness   Dispo: The patient is from: SNF              Anticipated d/c is to: SNF              Anticipated d/c date is: > 3 days              Patient currently is not medically stable to d/c.   Difficult to place patient No       Consultants:  Orthopedic surgery Oncology over the phone   Sch Meds:  Scheduled Meds: . (feeding supplement) PROSource Plus  30 mL Oral Daily  . [START ON 06/23/2020] apixaban  5 mg Oral BID   Followed by  . apixaban  2.5 mg Oral BID  . Chlorhexidine Gluconate Cloth  6 each Topical Daily  . docusate sodium  100 mg Oral BID  . feeding supplement  237 mL Oral BID BM  . hydrocortisone sod succinate (SOLU-CORTEF) inj  50 mg Intravenous Q6H   Followed by  . [START ON  06/23/2020] hydrocortisone sod succinate (SOLU-CORTEF) inj  50 mg Intravenous Q8H   Followed by  . [START ON 06/24/2020] hydrocortisone sod succinate (SOLU-CORTEF) inj  50 mg Intravenous Q12H   Followed by  . [START ON 06/25/2020] hydrocortisone sod succinate (SOLU-CORTEF) inj  50 mg Intravenous Daily  . pantoprazole  40 mg Oral q AM   Continuous Infusions: . sodium chloride 100 mL/hr at 06/22/20 1127   PRN Meds:.acetaminophen, ALPRAZolam, HYDROcodone-acetaminophen, HYDROcodone-acetaminophen, HYDROcodone-acetaminophen, menthol-cetylpyridinium **OR** phenol, morphine injection, ondansetron **OR** ondansetron (ZOFRAN) IV, polyethylene glycol, polyvinyl alcohol, senna-docusate  Antimicrobials: Anti-infectives (From admission, onward)   Start     Dose/Rate Route Frequency Ordered Stop   06/20/20 1800  ceFAZolin (ANCEF) IVPB 2g/100 mL premix        2 g 200 mL/hr over 30 Minutes Intravenous Every 6 hours 06/20/20 1602 06/20/20 2342   06/20/20 1650  ceFAZolin (ANCEF) 2-4 GM/100ML-% IVPB       Note to Pharmacy: Dara Lords   : cabinet override      06/20/20 1650 06/21/20 0459   06/20/20 1203  ceFAZolin (ANCEF) 2-4 GM/100ML-% IVPB       Note to Pharmacy: Bernadene Person   :  cabinet override      06/20/20 1203 06/20/20 1343   06/20/20 0600  ceFAZolin (ANCEF) IVPB 2g/100 mL premix        2 g 200 mL/hr over 30 Minutes Intravenous On call to O.R. 06/19/20 2027 06/20/20 1217       I have personally reviewed the following labs and images: CBC: Recent Labs  Lab 06/19/20 1154 06/19/20 1605 06/20/20 0631 06/20/20 1405 06/21/20 0433 06/21/20 1532 06/22/20 0724  WBC 13.8*  --  9.6  --  8.5  --  10.1  NEUTROABS 12.3*  --   --   --   --   --   --   HGB 13.3   < > 12.0 9.8* 8.5* 8.5* 8.6*  HCT 40.3   < > 36.6 30.1* 26.2* 26.6* 28.4*  MCV 95.0  --  96.3  --  97.8  --  103.6*  PLT 164  --  154  --  148*  --  170   < > = values in this interval not displayed.   BMP &GFR Recent Labs  Lab  06/19/20 1154 06/19/20 1803 06/20/20 0631 06/21/20 0433 06/22/20 0724  NA 142  --  142 141 140  K 2.9*  --  4.3 3.6 3.8  CL 97*  --  105 105 106  CO2 29  --  '27 26 23  ' GLUCOSE 174*  --  98 145* 120*  BUN 14  --  14 16 25*  CREATININE 0.94  --  0.87 0.73 1.20*  CALCIUM 8.8*  --  8.4* 8.2* 8.4*  MG  --  1.5*  --  2.0 2.0  PHOS  --   --   --  3.7 3.4   Estimated Creatinine Clearance: 27.4 mL/min (A) (by C-G formula based on SCr of 1.2 mg/dL (H)). Liver & Pancreas: Recent Labs  Lab 06/19/20 1154 06/21/20 0433 06/22/20 0724  AST 18  --   --   ALT 8  --   --   ALKPHOS 233*  --   --   BILITOT 1.3*  --   --   PROT 6.5  --   --   ALBUMIN 3.0* 2.6* 2.7*   No results for input(s): LIPASE, AMYLASE in the last 168 hours. No results for input(s): AMMONIA in the last 168 hours. Diabetic: Recent Labs    06/19/20 1803  HGBA1C 5.2   Recent Labs  Lab 06/21/20 1642  GLUCAP 115*   Cardiac Enzymes: No results for input(s): CKTOTAL, CKMB, CKMBINDEX, TROPONINI in the last 168 hours. No results for input(s): PROBNP in the last 8760 hours. Coagulation Profile: No results for input(s): INR, PROTIME in the last 168 hours. Thyroid Function Tests: Recent Labs    06/21/20 1430  TSH 0.440   Lipid Profile: No results for input(s): CHOL, HDL, LDLCALC, TRIG, CHOLHDL, LDLDIRECT in the last 72 hours. Anemia Panel: No results for input(s): VITAMINB12, FOLATE, FERRITIN, TIBC, IRON, RETICCTPCT in the last 72 hours. Urine analysis:    Component Value Date/Time   COLORURINE YELLOW 06/19/2020 2115   APPEARANCEUR CLEAR 06/19/2020 2115   LABSPEC 1.018 06/19/2020 2115   PHURINE 6.0 06/19/2020 2115   GLUCOSEU NEGATIVE 06/19/2020 2115   HGBUR NEGATIVE 06/19/2020 2115   BILIRUBINUR NEGATIVE 06/19/2020 2115   KETONESUR NEGATIVE 06/19/2020 2115   PROTEINUR NEGATIVE 06/19/2020 2115   UROBILINOGEN 0.2 06/18/2014 1950   NITRITE NEGATIVE 06/19/2020 2115   LEUKOCYTESUR NEGATIVE 06/19/2020 2115    Sepsis Labs: Invalid input(s): PROCALCITONIN, LACTICIDVEN  Microbiology: Recent Results (from the past 240 hour(s))  SARS CORONAVIRUS 2 (TAT 6-24 HRS) Nasopharyngeal Nasopharyngeal Swab     Status: None   Collection Time: 06/19/20 11:55 AM   Specimen: Nasopharyngeal Swab  Result Value Ref Range Status   SARS Coronavirus 2 NEGATIVE NEGATIVE Final    Comment: (NOTE) SARS-CoV-2 target nucleic acids are NOT DETECTED.  The SARS-CoV-2 RNA is generally detectable in upper and lower respiratory specimens during the acute phase of infection. Negative results do not preclude SARS-CoV-2 infection, do not rule out co-infections with other pathogens, and should not be used as the sole basis for treatment or other patient management decisions. Negative results must be combined with clinical observations, patient history, and epidemiological information. The expected result is Negative.  Fact Sheet for Patients: SugarRoll.be  Fact Sheet for Healthcare Providers: https://www.woods-mathews.com/  This test is not yet approved or cleared by the [place] FDA and  has been authorized for detection and/or diagnosis of SARS-CoV-2 by FDA under an Emergency Use Authorization (EUA). This EUA will remain  in effect (meaning this test can be used) for the duration of the COVID-19 declaration under Se ction 564(b)(1) of the Act, 21 U.S.C. section 360bbb-3(b)(1), unless the authorization is terminated or revoked sooner.  Performed at [facility], [place] [address], [place], [place] [address]   MRSA PCR Screening     Status: None   Collection Time: 06/19/20  9:15 PM   Specimen: Nasal Mucosa; Nasopharyngeal  Result Value Ref Range Status   MRSA by PCR NEGATIVE NEGATIVE Final    Comment:        The GeneXpert MRSA Assay (FDA approved for NASAL specimens only), is one component of a comprehensive MRSA colonization surveillance program. It is  not intended to diagnose MRSA infection nor to guide or monitor treatment for MRSA infections. Performed at [facility], [place] [address], [place], [place] [address]     Radiology Studies: No results found.   Bekki Tavenner T. Williams  If 7PM-7AM, please contact night-coverage [email or web address] 06/22/2020, 12:09 PM

## 2020-06-22 NOTE — Progress Notes (Signed)
    Subjective:  Patient appears to be in no distress. Patient is in mittens due to trying to remove IV lines. Patient doesn't answer questions but does follow instructions when asked to flex feet.  Objective:   VITALS:   Vitals:   06/21/20 1320 06/21/20 1450 06/22/20 0433 06/22/20 0855  BP: (!) 84/48 (!) 86/54 (!) 106/57 93/65  Pulse: (!) 104  88 (!) 116  Resp: 17  18 16   Temp: 97.8 F (36.6 C)  97.7 F (36.5 C)   TempSrc: Axillary  Oral   SpO2: 97%  97% (!) 83%  Weight:      Height:        NAD ABD soft Neurovascular intact Sensation intact distally Intact pulses distally Dorsiflexion/Plantar flexion intact Incision: dressing C/D/I   Lab Results  Component Value Date   WBC 10.1 06/22/2020   HGB 8.6 (L) 06/22/2020   HCT 28.4 (L) 06/22/2020   MCV 103.6 (H) 06/22/2020   PLT 170 06/22/2020   BMET    Component Value Date/Time   NA 140 06/22/2020 0724   NA 142 11/03/2017 0000   K 3.8 06/22/2020 0724   CL 106 06/22/2020 0724   CO2 23 06/22/2020 0724   GLUCOSE 120 (H) 06/22/2020 0724   BUN 25 (H) 06/22/2020 0724   BUN 23 (A) 11/03/2017 0000   CREATININE 1.20 (H) 06/22/2020 0724   CALCIUM 8.4 (L) 06/22/2020 0724   GFRNONAA 43 (L) 06/22/2020 0724   GFRAA >60 12/29/2019 0059     Assessment/Plan: 2 Days Post-Op   Active Problems:   Femur fracture (HCC)   Hip fracture (HCC)   Weight bearing for transfers only DVT ppx: Apixaban, SCDs, TEDS ABLA: Treat per hospitalist recommendations. HgB 8.6 this AM PO pain control PT/OT Dispo: D/C planning     Dorothyann Peng 06/22/2020, 11:54 AM   [facility] is now [employer] [address] [place], [place], [place] [address] Phone: [phone] [email or web address] Facebook  [employer]

## 2020-06-23 ENCOUNTER — Telehealth: Payer: Self-pay | Admitting: Hematology

## 2020-06-23 ENCOUNTER — Encounter (HOSPITAL_COMMUNITY): Payer: Self-pay | Admitting: Orthopedic Surgery

## 2020-06-23 DIAGNOSIS — D62 Acute posthemorrhagic anemia: Secondary | ICD-10-CM

## 2020-06-23 DIAGNOSIS — D529 Folate deficiency anemia, unspecified: Secondary | ICD-10-CM

## 2020-06-23 LAB — PROTEIN ELECTROPHORESIS, SERUM
A/G Ratio: 1 (ref 0.7–1.7)
Albumin ELP: 2.4 g/dL — ABNORMAL LOW (ref 2.9–4.4)
Alpha-1-Globulin: 0.4 g/dL (ref 0.0–0.4)
Alpha-2-Globulin: 0.6 g/dL (ref 0.4–1.0)
Beta Globulin: 0.7 g/dL (ref 0.7–1.3)
Gamma Globulin: 0.8 g/dL (ref 0.4–1.8)
Globulin, Total: 2.5 g/dL (ref 2.2–3.9)
Total Protein ELP: 4.9 g/dL — ABNORMAL LOW (ref 6.0–8.5)

## 2020-06-23 LAB — FERRITIN: Ferritin: 181 ng/mL (ref 11–307)

## 2020-06-23 LAB — CBC
HCT: 24.6 % — ABNORMAL LOW (ref 36.0–46.0)
Hemoglobin: 7.8 g/dL — ABNORMAL LOW (ref 12.0–15.0)
MCH: 31.6 pg (ref 26.0–34.0)
MCHC: 31.7 g/dL (ref 30.0–36.0)
MCV: 99.6 fL (ref 80.0–100.0)
Platelets: 162 10*3/uL (ref 150–400)
RBC: 2.47 MIL/uL — ABNORMAL LOW (ref 3.87–5.11)
RDW: 14.9 % (ref 11.5–15.5)
WBC: 8.8 10*3/uL (ref 4.0–10.5)
nRBC: 0.6 % — ABNORMAL HIGH (ref 0.0–0.2)

## 2020-06-23 LAB — MULTIPLE MYELOMA PANEL, SERUM
Albumin SerPl Elph-Mcnc: 2.7 g/dL — ABNORMAL LOW (ref 2.9–4.4)
Albumin/Glob SerPl: 1.1 (ref 0.7–1.7)
Alpha 1: 0.4 g/dL (ref 0.0–0.4)
Alpha2 Glob SerPl Elph-Mcnc: 0.6 g/dL (ref 0.4–1.0)
B-Globulin SerPl Elph-Mcnc: 0.7 g/dL (ref 0.7–1.3)
Gamma Glob SerPl Elph-Mcnc: 0.8 g/dL (ref 0.4–1.8)
Globulin, Total: 2.5 g/dL (ref 2.2–3.9)
IgA: 154 mg/dL (ref 64–422)
IgG (Immunoglobin G), Serum: 900 mg/dL (ref 586–1602)
IgM (Immunoglobulin M), Srm: 53 mg/dL (ref 26–217)
Total Protein ELP: 5.2 g/dL — ABNORMAL LOW (ref 6.0–8.5)

## 2020-06-23 LAB — RENAL FUNCTION PANEL
Albumin: 2.7 g/dL — ABNORMAL LOW (ref 3.5–5.0)
Anion gap: 8 (ref 5–15)
BUN: 34 mg/dL — ABNORMAL HIGH (ref 8–23)
CO2: 23 mmol/L (ref 22–32)
Calcium: 8.6 mg/dL — ABNORMAL LOW (ref 8.9–10.3)
Chloride: 110 mmol/L (ref 98–111)
Creatinine, Ser: 0.92 mg/dL (ref 0.44–1.00)
GFR, Estimated: 60 mL/min — ABNORMAL LOW (ref >60)
Glucose, Bld: 132 mg/dL — ABNORMAL HIGH (ref 70–99)
Phosphorus: 2.6 mg/dL (ref 2.5–4.6)
Potassium: 4.1 mmol/L (ref 3.5–5.1)
Sodium: 141 mmol/L (ref 135–145)

## 2020-06-23 LAB — RETICULOCYTES
Immature Retic Fract: 30.6 % — ABNORMAL HIGH (ref 2.3–15.9)
RBC.: 2.5 MIL/uL — ABNORMAL LOW (ref 3.87–5.11)
Retic Count, Absolute: 70.5 10*3/uL (ref 19.0–186.0)
Retic Ct Pct: 2.8 % (ref 0.4–3.1)

## 2020-06-23 LAB — MAGNESIUM: Magnesium: 2 mg/dL (ref 1.7–2.4)

## 2020-06-23 LAB — IRON AND TIBC
Iron: 65 ug/dL (ref 28–170)
Saturation Ratios: 29 % (ref 10.4–31.8)
TIBC: 227 ug/dL — ABNORMAL LOW (ref 250–450)
UIBC: 162 ug/dL

## 2020-06-23 LAB — VITAMIN B12: Vitamin B-12: 1038 pg/mL — ABNORMAL HIGH (ref 180–914)

## 2020-06-23 LAB — FOLATE: Folate: 3.4 ng/mL — ABNORMAL LOW (ref >5.9)

## 2020-06-23 LAB — ACTH: C206 ACTH: 11.3 pg/mL (ref 7.2–63.3)

## 2020-06-23 MED ORDER — FOLIC ACID 1 MG PO TABS
1.0000 mg | ORAL_TABLET | Freq: Every day | ORAL | Status: DC
  Administered 2020-06-24 – 2020-06-25 (×2): 1 mg via ORAL
  Filled 2020-06-23 (×3): qty 1

## 2020-06-23 NOTE — TOC Initial Note (Signed)
Transition of Care [facility]) - Initial/Assessment Note    Patient Details  Name: Jacqueline [name] MRN: [id number] Date of Birth: [birth year]  Transition of Care [facility]) CM/SW Contact:    Lennart Pall, LCSW Phone Number: 06/23/2020, 4:42 PM  Clinical Narrative:                 Met with pt today to introduce self/ role.  Pt lying in bed but very focused on needing to "get out of here".  She was only oriented to self.   Contacted son, Amarionna Arca, to gather information.  He confirms that pt is currently an ALF resident at [facility].  He is aware that SNF has been recommended for rehab following fx and surgery.  He is in agreement with this plan and requests placement in [place] if possible.   Son confirms that pt has had COVID vaccines and booster.   Will begin SNF placement process.  Expected Discharge Plan: [place] Barriers to Discharge: Continued Medical Work up   Patient Goals and CMS Choice        Expected Discharge Plan and Services Expected Discharge Plan: [place] In-house Referral: Clinical Social Work     Living arrangements for the past 2 months: [place] ([place])                 DME Arranged: N/A DME Agency: NA                  Prior Living Arrangements/Services Living arrangements for the past 2 months: [facility]) Lives with:: Facility Resident Patient language and need for interpreter reviewed:: Yes Do you feel safe going back to the place where you live?: Yes      Need for Family Participation in Patient Care: No (Comment) Care giver support system in place?: Yes (comment)   Criminal Activity/Legal Involvement Pertinent to Current Situation/Hospitalization: No - Comment as needed  Activities of Daily Living Home Assistive Devices/Equipment: Grab bars around toilet,Grab bars in shower,Hand-held shower hose,Walker (specify type),Wheelchair,Blood pressure cuff,Scales  (4 wheeled walker) ADL Screening (condition at time of admission) Patient's cognitive ability adequate to safely complete daily activities?: No Is the patient deaf or have difficulty hearing?: Yes ([place]) Does the patient have difficulty seeing, even when wearing glasses/contacts?: No Does the patient have difficulty concentrating, remembering, or making decisions?: Yes Patient able to express need for assistance with ADLs?: Yes Does the patient have difficulty dressing or bathing?: Yes Independently performs ADLs?: No Communication: Independent Dressing (OT): Needs assistance Is this a change from baseline?: Pre-admission baseline Grooming: Needs assistance Is this a change from baseline?: Pre-admission baseline Feeding: Needs assistance Is this a change from baseline?: Pre-admission baseline Bathing: Needs assistance Is this a change from baseline?: Pre-admission baseline Toileting: Dependent Is this a change from baseline?: Change from baseline, expected to last >3days In/Out Bed: Dependent Is this a change from baseline?: Change from baseline, expected to last >3 days Walks in Home: Dependent Is this a change from baseline?: Change from baseline, expected to last >3 days Does the patient have difficulty walking or climbing stairs?: Yes Weakness of Legs: Left Weakness of Arms/Hands: None  Permission Sought/Granted   Permission granted to share information with : Yes, Verbal Permission Granted  Share Information with NAME: Madisen Ludvigsen     Permission granted to share info w Relationship: son  Permission granted to share info w Contact Information: [phone]  Emotional Assessment Appearance:: Appears stated age Attitude/Demeanor/Rapport:  Guarded Affect (typically observed): Restless Orientation: : Oriented to Self Alcohol / Substance Use: Not Applicable Psych Involvement: No (comment)  Admission diagnosis:  Cough [R05.9] Femur fracture ([place]) [S72.90XA] Hip fracture  ([place]) [S72.009A] Fall [W19.XXXA] Patient Active Problem List   Diagnosis Date Noted  . Femur fracture ([place]) 06/19/2020  . Hip fracture ([place]) 06/19/2020  . Weakness 12/27/2019  . Generalized weakness 08/05/2019  . Macrocytosis 08/05/2019  . Acute on chronic kidney failure ([place]) 08/05/2019  . Acute respiratory failure with hypoxia ([place]) 12/24/2018  . Acute on chronic diastolic CHF (congestive heart failure) ([place]) 12/24/2018  . Dyspnea 12/24/2018  . Chronic knee pain 12/24/2018  . History of multiple cerebrovascular accidents (CVAs) 12/24/2018  . GERD (gastroesophageal reflux disease) 11/04/2017  . Altered mental status 11/03/2017  . CVA (cerebral vascular accident) ([place]) 10/25/2017  . Benign essential HTN   . History of TIA (transient ischemic attack)   . TIA (transient ischemic attack) 10/24/2017  . Cerebral infarction due to embolism of right middle cerebral artery ([place])   . HLD (hyperlipidemia)   . Acute ischemic right MCA stroke ([place]) 06/18/2014  . Facial droop   . Displaced fracture of shaft of left humerus 04/03/2014  . Right arm fracture 04/01/2014  . Displaced spiral fracture of shaft of humerus   . Peripheral arterial disease ([place]) 10/03/2013  . Chronic diastolic CHF (congestive heart failure) ([place]) 08/08/2013  . Chronic venous insufficiency 04/02/2013  . UTI (urinary tract infection) 01/13/2013  . Hypokalemia 01/13/2013  . PAF (paroxysmal atrial fibrillation) ([place]) 10/03/2012  . CAD (coronary artery disease) of artery bypass graft 04/05/2012  . Atrial fibrillation with RVR ([place]) 03/13/2012  . Need for prophylactic vaccination and inoculation against influenza 02/14/2012  . ABDOMINAL WALL HERNIA 01/21/2010  . PARESTHESIA 05/04/2009  . SKIN LESION 01/28/2009  . Nocturia 01/28/2009  . DYSPNEA 01/01/2009  . CVA (cerebral infarction) 11/17/2008  . ALLERGY 10/27/2008  . PAROXYSMAL NOCTURNAL DYSPNEA 10/03/2008  . Osteoarthrosis, unspecified whether generalized or  localized, involving lower leg 09/03/2008  . RESTRICTIVE LUNG DISEASE 05/26/2008  . Symptomatic anemia 01/08/2008  . UNS ADVRS EFF OTH RX MEDICINAL&BIOLOGICAL SBSTNC 11/09/2007  . HEMATOCHEZIA 07/18/2007  . PARANOIA 06/01/2007  . Anxiety 01/23/2007  . Hypertensive heart disease with CHF ([place]) 01/23/2007  . GI BLEEDING 01/23/2007  . Osteoarthritis 01/23/2007  . DEGENERATIVE DISC DISEASE, LUMBAR SPINE 01/23/2007  . History of peptic ulcer disease 01/23/2007  . Personal history of other diseases of digestive system 01/23/2007   PCP:  Lajean Manes, MD Pharmacy:   [facility], [address] [place] [place] [address] Phone: [phone] Fax: [phone]     Social Determinants of Health (SDOH) Interventions    Readmission Risk Interventions No flowsheet data found.

## 2020-06-23 NOTE — Progress Notes (Addendum)
PROGRESS NOTE  Jacqueline [name] IRC:[id number] DOB: 06/23/1928   PCP: Lajean Manes, MD  Patient is from: SNF  DOA: 06/19/2020 LOS: 4  Chief complaints: Left thigh pain  Brief Narrative / Interim history: 87 year old F with PMH of A. fib on Eliquis, dementia, HTN, anxiety and GERD brought to ED from SNF with left thigh pain. She heard a loud pop when she pivoted on left leg for transfer.  X-ray and CT showed displaced, foreshortened and angulated left femoral shaft fracture concerning for pathologic fracture.   Orthopedic surgery consulted.  CT chest, abdomen and pelvis revealed additional aggressive bone lesions (T8, left third rib, L2, upper right ilium and left ilium) with no primary malignancy within chest or abdomen.  She underwent intramedullary fixation of left femoral by Dr. Lyla Glassing on 2/19.  Oncology, Dr. Irene Limbo recommended myeloma panel, light chains and outpatient follow-up after pathology result.   Hospital course noteworthy of delirium, hypotension, arterial insufficiency and anemia.   Subjective: Seen and examined earlier this morning.  No major events overnight of this morning.  She is awake and oriented to self and place but confused.  Reports pain all over.  Was not able to localize.  Hemoglobin down to 7.8.  No obvious bleeding.  Objective: Vitals:   06/22/20 1244 06/22/20 2135 06/23/20 0338 06/23/20 0549  BP: 126/73 (!) 102/57 104/61 115/68  Pulse: 87 94 (!) 101 (!) 104  Resp: '16 18 18 18  ' Temp: 97.9 F (36.6 C) 98.1 F (36.7 C) 98 F (36.7 C) 98.5 F (36.9 C)  TempSrc: Oral Oral Oral Oral  SpO2: 98% 92% 90% 90%  Weight:      Height:        Intake/Output Summary (Last 24 hours) at 06/23/2020 1139 Last data filed at 06/23/2020 5102 Gross per 24 hour  Intake 2906.26 ml  Output 100 ml  Net 2806.26 ml   Filed Weights   06/20/20 0811  Weight: 65.1 kg    Examination:  GENERAL: No apparent distress.  Nontoxic. HEENT: MMM.  Vision and hearing grossly intact.   NECK: Supple.  No apparent JVD.  RESP: On RA.  No IWOB.  Fair aeration bilaterally. CVS:  RRR. Heart sounds normal.  ABD/GI/GU: BS+. Abd soft, NTND.  MSK/EXT:  Moves extremities. No apparent deformity. No edema.  SKIN: Surgical dressing DCI.  No bleeding or hematoma. NEURO: Awake, alert and oriented to self and place.  Follows commands.  No apparent focal neuro deficit. PSYCH: Calm.  No distress or agitation.  Procedures:  2/19-intramedullary fixation of left femoral shaft fracture  Microbiology summarized: COVID-19 PCR nonreactive. MRSA PCR screen nonreactive.  Assessment & Plan: Displaced and angulated left femoral shaft fracture-likely pathologic given additional aggressive bone lesions elsewhere as noted above.  She has elevated alk phos with normal GGT.  -S/p intramedullary fixation of left humeral shaft fracture -Scheduled Tylenol with as needed oxycodone and IV morphine for pain control -SCD for VTE prophylaxis given downtrending hemoglobin. -Oncology, Dr. Irene Limbo recs: myeloma panel,  light chains, surgical pathology and outpatient follow-up -Bowel regimen.  Possible adrenal insufficiency/surgical distress: Random continue 2.7.  A.m. cortisol 4.4. -Continue stress dose steroid taper -Follow ACTH.  Hypotension: Likely due to adrenal insufficiency/surgical stress.  Resolved after starting stress dose steroid.  Likely due to adrenal insufficiency. TSH and lactic acid within normal.  TTE without significant finding. -Continue holding home Cardizem, metoprolol and Lasix.  AKI: Likely prerenal from hypotension. Recent Labs    08/06/19 0407 08/07/19 0513 12/27/19 1400 12/28/19 0148  12/29/19 0059 06/19/20 1154 06/20/20 0631 06/21/20 0433 06/22/20 0724 06/23/20 0513  BUN 22 18 25* '21 13 14 14 16 ' 25* 34*  CREATININE 0.91 0.83 0.95 0.86 0.74 0.94 0.87 0.73 1.20* 0.92  -Improved. -Avoid nephrotoxic meds -Continue monitoring  Acute blood loss anemia?- no obvious signs of  bleeding.  Could be some element of dilution from IV fluid. Folic acid deficiency Recent Labs    12/29/19 0059 06/19/20 1154 06/19/20 1605 06/19/20 2145 06/20/20 0631 06/20/20 1405 06/21/20 0433 06/21/20 1532 06/22/20 0724 06/23/20 0513  HGB 13.9 13.3 10.9* 11.4* 12.0 9.8* 8.5* 8.5* 8.6* 7.8*  -Stop Eliquis -Discontinue IV fluid -Check Hemoccult -Continue monitoring  Paroxysmal A. fib: Rate controlled. -On Cardizem, metoprolol and Eliquis on hold. -Optimize K and MG  Elevated alk phos with normal GGT-likely osseous source -Evaluation as above  Hypokalemia/hypomagnesemia: Resolved. -Monitor and replenish aggressively  Hyperglycemia without diagnosis of diabetes: A1c 5.2%.  Resolved.  Lactic acidosis: Likely mediated by hypotension.  No evidence of infection.  Resolved.  Dementia with behavioral disturbance/delirium -Reorientation and delirium precautions. -Medications reviewed and adjusted.  Anxiety-takes Xanax at home. -Continue reduced dose of this  Goal of care/DNR/DNI-appropriate. -Further goal of care discussion based on clinical course and pathology finding  Body mass index is 27.11 kg/m. Nutrition Problem: Increased nutrient needs Etiology: acute illness,catabolic illness Signs/Symptoms: estimated needs Interventions: Ensure Enlive (each supplement provides 350kcal and 20 grams of protein),Prostat   DVT prophylaxis:  SCDs Start: 06/20/20 1602SCD apixaban (ELIQUIS) tablet 5 mg  Code Status: DNR/DNI Family Communication: Updated patient's son over the phone on 02/19.  Did not answer on 2/20 and 21. Level of care: Med-Surg Status is: Inpatient  Remains inpatient appropriate because:Ongoing diagnostic testing needed not appropriate for outpatient work up, Unsafe d/c plan and Inpatient level of care appropriate due to severity of illness   Dispo: The patient is from: SNF              Anticipated d/c is to: SNF              Anticipated d/c date is: 2  days              Patient currently is not medically stable to d/c.   Difficult to place patient No       Consultants:  Orthopedic surgery Oncology over the phone   Sch Meds:  Scheduled Meds: . (feeding supplement) PROSource Plus  30 mL Oral Daily  . acetaminophen  1,000 mg Oral Q8H  . apixaban  5 mg Oral BID  . Chlorhexidine Gluconate Cloth  6 each Topical Daily  . docusate sodium  100 mg Oral BID  . feeding supplement  237 mL Oral BID BM  . hydrocortisone sod succinate (SOLU-CORTEF) inj  50 mg Intravenous Q6H   Followed by  . hydrocortisone sod succinate (SOLU-CORTEF) inj  50 mg Intravenous Q8H   Followed by  . [START ON 06/24/2020] hydrocortisone sod succinate (SOLU-CORTEF) inj  50 mg Intravenous Q12H   Followed by  . [START ON 06/25/2020] hydrocortisone sod succinate (SOLU-CORTEF) inj  50 mg Intravenous Daily  . pantoprazole  40 mg Oral q AM   Continuous Infusions:  PRN Meds:.ALPRAZolam, menthol-cetylpyridinium **OR** phenol, morphine injection, ondansetron **OR** ondansetron (ZOFRAN) IV, oxyCODONE, polyethylene glycol, polyvinyl alcohol, senna-docusate  Antimicrobials: Anti-infectives (From admission, onward)   Start     Dose/Rate Route Frequency Ordered Stop   06/20/20 1800  ceFAZolin (ANCEF) IVPB 2g/100 mL premix        2  g 200 mL/hr over 30 Minutes Intravenous Every 6 hours 06/20/20 1602 06/20/20 2342   06/20/20 1650  ceFAZolin (ANCEF) 2-4 GM/100ML-% IVPB       Note to Pharmacy: Dara Lords   : cabinet override      06/20/20 1650 06/21/20 0459   06/20/20 1203  ceFAZolin (ANCEF) 2-4 GM/100ML-% IVPB       Note to Pharmacy: Bernadene Person   : cabinet override      06/20/20 1203 06/20/20 1343   06/20/20 0600  ceFAZolin (ANCEF) IVPB 2g/100 mL premix        2 g 200 mL/hr over 30 Minutes Intravenous On call to O.R. 06/19/20 2027 06/20/20 1217       I have personally reviewed the following labs and images: CBC: Recent Labs  Lab 06/19/20 1154 06/19/20 1605  06/20/20 0631 06/20/20 1405 06/21/20 0433 06/21/20 1532 06/22/20 0724 06/23/20 0513  WBC 13.8*  --  9.6  --  8.5  --  10.1 8.8  NEUTROABS 12.3*  --   --   --   --   --   --   --   HGB 13.3   < > 12.0 9.8* 8.5* 8.5* 8.6* 7.8*  HCT 40.3   < > 36.6 30.1* 26.2* 26.6* 28.4* 24.6*  MCV 95.0  --  96.3  --  97.8  --  103.6* 99.6  PLT 164  --  154  --  148*  --  170 162   < > = values in this interval not displayed.   BMP &GFR Recent Labs  Lab 06/19/20 1154 06/19/20 1803 06/20/20 0631 06/21/20 0433 06/22/20 0724 06/23/20 0513  NA 142  --  142 141 140 141  K 2.9*  --  4.3 3.6 3.8 4.1  CL 97*  --  105 105 106 110  CO2 29  --  '27 26 23 23  ' GLUCOSE 174*  --  98 145* 120* 132*  BUN 14  --  14 16 25* 34*  CREATININE 0.94  --  0.87 0.73 1.20* 0.92  CALCIUM 8.8*  --  8.4* 8.2* 8.4* 8.6*  MG  --  1.5*  --  2.0 2.0 2.0  PHOS  --   --   --  3.7 3.4 2.6   Estimated Creatinine Clearance: 35.8 mL/min (by C-G formula based on SCr of 0.92 mg/dL). Liver & Pancreas: Recent Labs  Lab 06/19/20 1154 06/21/20 0433 06/22/20 0724 06/23/20 0513  AST 18  --   --   --   ALT 8  --   --   --   ALKPHOS 233*  --   --   --   BILITOT 1.3*  --   --   --   PROT 6.5  --   --   --   ALBUMIN 3.0* 2.6* 2.7* 2.7*   No results for input(s): LIPASE, AMYLASE in the last 168 hours. No results for input(s): AMMONIA in the last 168 hours. Diabetic: No results for input(s): HGBA1C in the last 72 hours. Recent Labs  Lab 06/21/20 1642  GLUCAP 115*   Cardiac Enzymes: No results for input(s): CKTOTAL, CKMB, CKMBINDEX, TROPONINI in the last 168 hours. No results for input(s): PROBNP in the last 8760 hours. Coagulation Profile: No results for input(s): INR, PROTIME in the last 168 hours. Thyroid Function Tests: Recent Labs    06/21/20 1430  TSH 0.440   Lipid Profile: No results for input(s): CHOL, HDL, LDLCALC, TRIG, CHOLHDL, LDLDIRECT in the last  72 hours. Anemia Panel: Recent Labs    06/23/20 0513   VITAMINB12 1,038*  FOLATE 3.4*  FERRITIN 181  TIBC 227*  IRON 65  RETICCTPCT 2.8   Urine analysis:    Component Value Date/Time   COLORURINE YELLOW 06/19/2020 2115   APPEARANCEUR CLEAR 06/19/2020 2115   LABSPEC 1.018 06/19/2020 2115   PHURINE 6.0 06/19/2020 2115   GLUCOSEU NEGATIVE 06/19/2020 2115   HGBUR NEGATIVE 06/19/2020 2115   BILIRUBINUR NEGATIVE 06/19/2020 2115   KETONESUR NEGATIVE 06/19/2020 2115   PROTEINUR NEGATIVE 06/19/2020 2115   UROBILINOGEN 0.2 06/18/2014 1950   NITRITE NEGATIVE 06/19/2020 2115   LEUKOCYTESUR NEGATIVE 06/19/2020 2115   Sepsis Labs: Invalid input(s): PROCALCITONIN, [place]  Microbiology: Recent Results (from the past 240 hour(s))  SARS CORONAVIRUS 2 (TAT 6-24 HRS) Nasopharyngeal Nasopharyngeal Swab     Status: None   Collection Time: 06/19/20 11:55 AM   Specimen: Nasopharyngeal Swab  Result Value Ref Range Status   SARS Coronavirus 2 NEGATIVE NEGATIVE Final    Comment: (NOTE) SARS-CoV-2 target nucleic acids are NOT DETECTED.  The SARS-CoV-2 RNA is generally detectable in upper and lower respiratory specimens during the acute phase of infection. Negative results do not preclude SARS-CoV-2 infection, do not rule out co-infections with other pathogens, and should not be used as the sole basis for treatment or other patient management decisions. Negative results must be combined with clinical observations, patient history, and epidemiological information. The expected result is Negative.  Fact Sheet for Patients: SugarRoll.be  Fact Sheet for Healthcare Providers: https://www.woods-mathews.com/  This test is not yet approved or cleared by the [place] FDA and  has been authorized for detection and/or diagnosis of SARS-CoV-2 by FDA under an Emergency Use Authorization (EUA). This EUA will remain  in effect (meaning this test can be used) for the duration of the COVID-19 declaration under  Se ction 564(b)(1) of the Act, 21 U.S.C. section 360bbb-3(b)(1), unless the authorization is terminated or revoked sooner.  Performed at [facility], [place] [address], [place], [place] [address]   MRSA PCR Screening     Status: None   Collection Time: 06/19/20  9:15 PM   Specimen: Nasal Mucosa; Nasopharyngeal  Result Value Ref Range Status   MRSA by PCR NEGATIVE NEGATIVE Final    Comment:        The GeneXpert MRSA Assay (FDA approved for NASAL specimens only), is one component of a comprehensive MRSA colonization surveillance program. It is not intended to diagnose MRSA infection nor to guide or monitor treatment for MRSA infections. Performed at [facility], [place] [address], [place], [place] [address]     Radiology Studies: ECHOCARDIOGRAM COMPLETE  Result Date: 06/22/2020    ECHOCARDIOGRAM REPORT   Patient Name:   LIZMARY NADER Date of Exam: 06/22/2020 Medical Rec #:  [id number]  Height:       61.0 in Accession #:    [id number] Weight:       143.5 lb Date of Birth:  [birth year]   BSA:          1.640 m Patient Age:    57 years   BP:           126/73 mmHg Patient Gender: F          HR:           108 bpm. Exam Location:  Inpatient Procedure: 2D Echo Indications:    Hypotension  History:        Patient has prior  history of Echocardiogram examinations, most                 recent 12/25/2018. Arrythmias:Atrial Fibrillation;                 Signs/Symptoms:Altered Mental Status.  Referring Phys: [id number] Charlesetta Ivory Lathen Seal  Sonographer Comments: Technically challenging study due to limited acoustic windows. Image acquisition challenging due to uncooperative patient. IMPRESSIONS  1. LV function is vigorous with near cavity obliteration during systole. Left ventricular ejection fraction, by estimation, is >75%. The left ventricle has hyperdynamic function. The left ventricle has no regional wall motion abnormalities. Left ventricular diastolic parameters are consistent with Grade I  diastolic dysfunction (impaired relaxation).  2. Right ventricular systolic function is normal. The right ventricular size is normal. There is severely elevated pulmonary artery systolic pressure.  3. Left atrial size was severely dilated.  4. The mitral valve is abnormal. Trivial mitral valve regurgitation.  5. The aortic valve was not well visualized. Aortic valve regurgitation is not visualized. No aortic stenosis is present. FINDINGS  Left Ventricle: LV function is vigorous with near cavity obliteration during systole. Left ventricular ejection fraction, by estimation, is >75%. The left ventricle has hyperdynamic function. The left ventricle has no regional wall motion abnormalities.  The left ventricular internal cavity size was small. There is no left ventricular hypertrophy. Left ventricular diastolic parameters are consistent with Grade I diastolic dysfunction (impaired relaxation). Right Ventricle: The right ventricular size is normal. Right vetricular wall thickness was not assessed. Right ventricular systolic function is normal. There is severely elevated pulmonary artery systolic pressure. The tricuspid regurgitant velocity is 3.68 m/s, and with an assumed right atrial pressure of 15 mmHg, the estimated right ventricular systolic pressure is 38.4 mmHg. Left Atrium: Left atrial size was severely dilated. Right Atrium: Right atrial size was normal in size. Pericardium: There is no evidence of pericardial effusion. Mitral Valve: The mitral valve is abnormal. There is mild thickening of the mitral valve leaflet(s). Mild mitral annular calcification. Trivial mitral valve regurgitation. Tricuspid Valve: The tricuspid valve is not well visualized. Tricuspid valve regurgitation is mild. Aortic Valve: The aortic valve was not well visualized. Aortic valve regurgitation is not visualized. No aortic stenosis is present. Pulmonic Valve: The pulmonic valve was not well visualized. Aorta: Aortic root could not be  assessed. IAS/Shunts: The interatrial septum was not assessed.   Diastology LV e' medial:    4.46 cm/s LV E/e' medial:  21.7 LV e' lateral:   5.44 cm/s LV E/e' lateral: 17.8  RIGHT VENTRICLE TAPSE (M-mode): 0.8 cm LEFT ATRIUM              Index       RIGHT ATRIUM          Index LA Vol (A2C):   74.0 ml  45.12 ml/m RA Area:     9.51 cm LA Vol (A4C):   119.0 ml 72.55 ml/m RA Volume:   15.20 ml 9.27 ml/m LA Biplane Vol: 97.1 ml  59.20 ml/m  AORTIC VALVE LVOT Vmax:   151.00 cm/s LVOT Vmean:  103.000 cm/s LVOT VTI:    0.267 m MITRAL VALVE                TRICUSPID VALVE MV Area (PHT): 2.36 cm     TR Peak grad:   54.2 mmHg MV Decel Time: 322 msec     TR Vmax:        368.00 cm/s MV E velocity: 96.80 cm/s MV  A velocity: 139.00 cm/s  SHUNTS MV E/A ratio:  0.70         Systemic VTI: 0.27 m Dorris Carnes MD Electronically signed by Dorris Carnes MD Signature Date/Time: 06/22/2020/7:11:43 PM    Final      Braeton Wol[place]ott T. Pickens  If 7PM-7AM, please contact night-coverage [email or web address] 06/23/2020, 11:39 AM

## 2020-06-23 NOTE — Telephone Encounter (Signed)
Received a staff msg to schedule a hospital f/u appt w/Dr. Irene Limbo for lytic bone lesions, concerned for multiple myeloma. Pt has been scheduled to see Dr. Irene Limbo on 3/9 at 1pm. She is currently in the hospital. Letter mailed.

## 2020-06-24 DIAGNOSIS — S72001F Fracture of unspecified part of neck of right femur, subsequent encounter for open fracture type IIIA, IIIB, or IIIC with routine healing: Secondary | ICD-10-CM

## 2020-06-24 LAB — HEMOGLOBIN AND HEMATOCRIT, BLOOD
HCT: 26.1 % — ABNORMAL LOW (ref 36.0–46.0)
Hemoglobin: 8.3 g/dL — ABNORMAL LOW (ref 12.0–15.0)

## 2020-06-24 LAB — RENAL FUNCTION PANEL
Albumin: 2.6 g/dL — ABNORMAL LOW (ref 3.5–5.0)
Anion gap: 10 (ref 5–15)
BUN: 28 mg/dL — ABNORMAL HIGH (ref 8–23)
CO2: 21 mmol/L — ABNORMAL LOW (ref 22–32)
Calcium: 8.8 mg/dL — ABNORMAL LOW (ref 8.9–10.3)
Chloride: 108 mmol/L (ref 98–111)
Creatinine, Ser: 0.72 mg/dL (ref 0.44–1.00)
GFR, Estimated: >60 mL/min (ref >60)
Glucose, Bld: 101 mg/dL — ABNORMAL HIGH (ref 70–99)
Phosphorus: 1.8 mg/dL — ABNORMAL LOW (ref 2.5–4.6)
Potassium: 3.8 mmol/L (ref 3.5–5.1)
Sodium: 139 mmol/L (ref 135–145)

## 2020-06-24 LAB — MAGNESIUM: Magnesium: 2 mg/dL (ref 1.7–2.4)

## 2020-06-24 LAB — SARS CORONAVIRUS 2 (TAT 6-24 HRS): SARS Coronavirus 2: NEGATIVE

## 2020-06-24 MED ORDER — METOPROLOL SUCCINATE ER 50 MG PO TB24
50.0000 mg | ORAL_TABLET | Freq: Every morning | ORAL | Status: DC
Start: 2020-06-24 — End: 2020-06-25
  Administered 2020-06-24 – 2020-06-25 (×2): 50 mg via ORAL
  Filled 2020-06-24 (×2): qty 1

## 2020-06-24 NOTE — TOC Progression Note (Signed)
Transition of Care [facility]) - Progression Note    Patient Details  Name: Jacqueline [name] MRN: [id number] Date of Birth: [birth year]  [facility] of [facility]) CM/SW Contact  Lennart Pall, LCSW Phone Number: 06/24/2020, 2:37 PM  Clinical Narrative:    Have reviewed SNF beds with pt/ son and they have accepted [name] SN[facility].  Anticipating pt will be medically ready tomorrow.     Expected Discharge Plan: [place] Barriers to Discharge: Continued Medical Work up  Expected Discharge Plan and Services Expected Discharge Plan: [place] In-house Referral: Clinical Social Work     Living arrangements for the past 2 months: [place] ([place])                 DME Arranged: N/A DME Agency: NA                   Social Determinants of Health (SDOH) Interventions    Readmission Risk Interventions No flowsheet data found.

## 2020-06-24 NOTE — NC FL2 (Signed)
[place] LEVEL OF CARE SCREENING TOOL     IDENTIFICATION  Patient Name: Jacqueline [name] Birthdate: [birth year] Sex: female Admission Date (Current Location): 06/19/2020  [facility] and [place] Number:  Herbalist and Address:  [facility],  [place], [place]      Provider Number: [id number]  Attending Physician Name and Address:  Donne Hazel, MD  Relative Name and Phone Number:  son, Tiffine Henigan @ [phone]    Current Level of Care: Hospital Recommended Level of Care: [place] Prior Approval Number:    Date Approved/Denied:   PASRR Number: [id number]  Discharge Plan: SNF    Current Diagnoses: Patient Active Problem List   Diagnosis Date Noted  . Femur fracture ([place]) 06/19/2020  . Hip fracture ([place]) 06/19/2020  . Weakness 12/27/2019  . Generalized weakness 08/05/2019  . Macrocytosis 08/05/2019  . Acute on chronic kidney failure ([place]) 08/05/2019  . Acute respiratory failure with hypoxia ([place]) 12/24/2018  . Acute on chronic diastolic CHF (congestive heart failure) ([place]) 12/24/2018  . Dyspnea 12/24/2018  . Chronic knee pain 12/24/2018  . History of multiple cerebrovascular accidents (CVAs) 12/24/2018  . GERD (gastroesophageal reflux disease) 11/04/2017  . Altered mental status 11/03/2017  . CVA (cerebral vascular accident) ([place]) 10/25/2017  . Benign essential HTN   . History of TIA (transient ischemic attack)   . TIA (transient ischemic attack) 10/24/2017  . Cerebral infarction due to embolism of right middle cerebral artery ([place])   . HLD (hyperlipidemia)   . Acute ischemic right MCA stroke ([place]) 06/18/2014  . Facial droop   . Displaced fracture of shaft of left humerus 04/03/2014  . Right arm fracture 04/01/2014  . Displaced spiral fracture of shaft of humerus   . Peripheral arterial disease ([place]) 10/03/2013  . Chronic diastolic CHF (congestive heart failure) ([place]) 08/08/2013  . Chronic venous  insufficiency 04/02/2013  . UTI (urinary tract infection) 01/13/2013  . Hypokalemia 01/13/2013  . PAF (paroxysmal atrial fibrillation) ([place]) 10/03/2012  . CAD (coronary artery disease) of artery bypass graft 04/05/2012  . Atrial fibrillation with RVR ([place]) 03/13/2012  . Need for prophylactic vaccination and inoculation against influenza 02/14/2012  . ABDOMINAL WALL HERNIA 01/21/2010  . PARESTHESIA 05/04/2009  . SKIN LESION 01/28/2009  . Nocturia 01/28/2009  . DYSPNEA 01/01/2009  . CVA (cerebral infarction) 11/17/2008  . ALLERGY 10/27/2008  . PAROXYSMAL NOCTURNAL DYSPNEA 10/03/2008  . Osteoarthrosis, unspecified whether generalized or localized, involving lower leg 09/03/2008  . RESTRICTIVE LUNG DISEASE 05/26/2008  . Symptomatic anemia 01/08/2008  . UNS ADVRS EFF OTH RX MEDICINAL&BIOLOGICAL SBSTNC 11/09/2007  . HEMATOCHEZIA 07/18/2007  . PARANOIA 06/01/2007  . Anxiety 01/23/2007  . Hypertensive heart disease with CHF ([place]) 01/23/2007  . GI BLEEDING 01/23/2007  . Osteoarthritis 01/23/2007  . DEGENERATIVE DISC DISEASE, LUMBAR SPINE 01/23/2007  . History of peptic ulcer disease 01/23/2007  . Personal history of other diseases of digestive system 01/23/2007    Orientation RESPIRATION BLADDER Height & Weight     Self,Place  Normal Incontinent,External catheter Weight: 143 lb 8 oz (65.1 kg) Height:  5\' 1"  (154.9 cm)  BEHAVIORAL SYMPTOMS/MOOD NEUROLOGICAL BOWEL NUTRITION STATUS      Incontinent    AMBULATORY STATUS COMMUNICATION OF NEEDS Skin   Extensive Assist Verbally Surgical wounds                       Personal Care Assistance Level of Assistance  Bathing,Dressing Bathing Assistance: Limited assistance  Dressing Assistance: Limited assistance     Functional Limitations Info             SPECIAL CARE FACTORS FREQUENCY  PT (By licensed PT),OT (By licensed OT)     PT Frequency: 5x/wk OT Frequency: 5x/wk            Contractures Contractures Info: Not  present    Additional Factors Info  Code Status,Allergies Code Status Info: DNR Allergies Info: see MAR           Current Medications (06/24/2020):  This is the current hospital active medication list Current Facility-Administered Medications  Medication Dose Route Frequency Provider Last Rate Last Admin  . (feeding supplement) PROSource Plus liquid 30 mL  30 mL Oral Daily Gonfa, Taye T, MD   30 mL at 06/23/20 1725  . acetaminophen (TYLENOL) tablet 1,000 mg  1,000 mg Oral Q8H Gonfa, Taye T, MD   1,000 mg at 06/23/20 1726  . ALPRAZolam Duanne Moron) tablet 0.5 mg  0.5 mg Oral BID PRN Rod Can, MD   0.5 mg at 06/23/20 1000  . Chlorhexidine Gluconate Cloth 2 % PADS 6 each  6 each Topical Daily Mercy Riding, MD   6 each at 06/21/20 1000  . docusate sodium (COLACE) capsule 100 mg  100 mg Oral BID Rod Can, MD   100 mg at 06/23/20 1000  . feeding supplement (ENSURE ENLIVE / ENSURE PLUS) liquid 237 mL  237 mL Oral BID BM Wendee Beavers T, MD   237 mL at 06/23/20 2100  . folic acid (FOLVITE) tablet 1 mg  1 mg Oral Daily Wendee Beavers T, MD   1 mg at 06/24/20 0831  . hydrocortisone sodium succinate (SOLU-CORTEF) 100 MG injection 50 mg  50 mg Intravenous Q8H Gonfa, Taye T, MD   50 mg at 06/24/20 0145   Followed by  . hydrocortisone sodium succinate (SOLU-CORTEF) 100 MG injection 50 mg  50 mg Intravenous Q12H Mercy Riding, MD       Followed by  . [START ON 06/25/2020] hydrocortisone sodium succinate (SOLU-CORTEF) 100 MG injection 50 mg  50 mg Intravenous Daily Gonfa, Taye T, MD      . menthol-cetylpyridinium (CEPACOL) lozenge 3 mg  1 lozenge Oral PRN Swinteck, Aaron Edelman, MD       Or  . phenol (CHLORASEPTIC) mouth spray 1 spray  1 spray Mouth/Throat PRN Swinteck, Aaron Edelman, MD      . metoprolol succinate (TOPROL-XL) 24 hr tablet 50 mg  50 mg Oral q AM Donne Hazel, MD      . morphine 4 MG/ML injection 0.52-1 mg  0.52-1 mg Intravenous Q2H PRN Rod Can, MD   1 mg at 06/23/20 2214  . ondansetron  (ZOFRAN) tablet 4 mg  4 mg Oral Q6H PRN Swinteck, Aaron Edelman, MD       Or  . ondansetron (ZOFRAN) injection 4 mg  4 mg Intravenous Q6H PRN Swinteck, Aaron Edelman, MD      . oxyCODONE (Oxy IR/ROXICODONE) immediate release tablet 5 mg  5 mg Oral Q6H PRN Mercy Riding, MD   5 mg at 06/23/20 0742  . pantoprazole (PROTONIX) EC tablet 40 mg  40 mg Oral q AM Rod Can, MD   40 mg at 06/24/20 0831  . polyethylene glycol (MIRALAX / GLYCOLAX) packet 17 g  17 g Oral BID PRN Gonfa, Taye T, MD      . polyvinyl alcohol (LIQUIFILM TEARS) 1.4 % ophthalmic solution 1 drop  1 drop Both Eyes PRN  Swinteck, Aaron Edelman, MD      . senna-docusate (Senokot-S) tablet 1 tablet  1 tablet Oral BID PRN Mercy Riding, MD         Discharge Medications: Please see discharge summary for a list of discharge medications.  Relevant Imaging Results:  Relevant Lab Results:   Additional Information SSN [id number]  Lennart Pall, LCSW

## 2020-06-24 NOTE — Progress Notes (Signed)
PROGRESS NOTE    Jacqueline [name]  ATF:[id number] DOB: [birth year] DOA: 06/19/2020 PCP: Lajean Manes, MD    Brief Narrative:  87 year old F with PMH of A. fib on Eliquis, dementia, HTN, anxiety and GERD brought to ED from SNF with left thigh pain. She heard a loud pop when she pivoted on left leg for transfer.  X-ray and CT showed displaced, foreshortened and angulated left femoral shaft fracture concerning for pathologic fracture.   Orthopedic surgery consulted.  CT chest, abdomen and pelvis revealed additional aggressive bone lesions (T8, left third rib, L2, upper right ilium and left ilium) with no primary malignancy within chest or abdomen.  She underwent intramedullary fixation of left femoral by Dr. Lyla Glassing on 2/19.  Oncology, Dr. Irene Limbo recommended myeloma panel, light chains and outpatient follow-up after pathology result.   Hospital course noteworthy of delirium, hypotension, arterial insufficiency and anemia.   Assessment & Plan:   Active Problems:   Femur fracture (HCC)   Hip fracture (HCC)  Displaced and angulated left femoral shaft fracture-likely pathologic given additional aggressive bone lesions elsewhere as noted above.  She has elevated alk phos with normal GGT.  -S/p intramedullary fixation of left humeral shaft fracture -Scheduled Tylenol with as needed oxycodone and IV morphine for pain control -SCD for VTE prophylaxis given downtrending hemoglobin. -Oncology, Dr. Irene Limbo recs: myeloma panel,  light chains, both reviewed and are unremarkable.  -Surgical pathology remains pending.  -Oncology recommends outpt f/u -Bowel regimen.  Possible adrenal insufficiency/surgical distress: Random continue 2.7.  A.m. cortisol 4.4. -Continue stress dose steroid tapering -Follow ACTH.  Hypotension: Likely due to adrenal insufficiency/surgical stress.  Resolved after starting stress dose steroid.  Likely due to adrenal insufficiency. TSH and lactic acid within normal.  TTE without  significant finding. -Continue holding home Cardizem, Lasix. -Metoprolol resumed today  AKI: Likely prerenal from hypotension. -resolved with IVF  Acute blood loss anemia - no obvious signs of bleeding currently, likely post-op -Hgb remains stable -Follow CBC trends and if stable, would resume eliquis per below  Paroxysmal A. fib: Rate controlled. -On Cardizem,and Eliquis on hold. -Optimize K and MG -Resumed metoprolol -If CBC trends remain stable, consider resuming eliquis in 24-48hrs  Elevated alk phos with normal GGT-likely osseous source -Evaluation as above  Hypokalemia/hypomagnesemia: Resolved. -Monitor and replenish aggressively  Hyperglycemia without diagnosis of diabetes: A1c 5.2%.  Resolved.  Lactic acidosis: Likely mediated by hypotension.  No evidence of infection.  Resolved.  Dementia with behavioral disturbance/delirium -Reorientation and delirium precautions. -Medications reviewed and adjusted.  Anxiety-takes Xanax at home. -Continue reduced dose of this  Goal of care/DNR/DNI-appropriate. -Further goal of care discussion based on clinical course and pathology finding  DVT prophylaxis: SCD's Code Status: DNR Family Communication: Pt in room, family not at bedside  Status is: Inpatient  Remains inpatient appropriate because:Unsafe d/c plan   Dispo: The patient is from: SNF              Anticipated d/c is to: SNF              Anticipated d/c date is: 1 day              Patient currently is medically stable to d/c. Just pending dispo   Difficult to place patient No       Consultants:   Orthopedic Surgery  Procedures:     Antimicrobials: Anti-infectives (From admission, onward)   Start     Dose/Rate Route Frequency Ordered Stop   06/20/20 1800  ceFAZolin (ANCEF)  IVPB 2g/100 mL premix        2 g 200 mL/hr over 30 Minutes Intravenous Every 6 hours 06/20/20 1602 06/20/20 2342   06/20/20 1650  ceFAZolin (ANCEF) 2-4 GM/100ML-% IVPB        Note to Pharmacy: Dara Lords   : cabinet override      06/20/20 1650 06/21/20 0459   06/20/20 1203  ceFAZolin (ANCEF) 2-4 GM/100ML-% IVPB       Note to Pharmacy: Bernadene Person   : cabinet override      06/20/20 1203 06/20/20 1343   06/20/20 0600  ceFAZolin (ANCEF) IVPB 2g/100 mL premix        2 g 200 mL/hr over 30 Minutes Intravenous On call to O.R. 06/19/20 2027 06/20/20 1217       Subjective: Pleasantly confused  Objective: Vitals:   06/23/20 1402 06/23/20 2128 06/24/20 0613 06/24/20 1320  BP: 120/72 132/77 130/83 137/76  Pulse: 97 99 (!) 101 97  Resp: _0 Temp: 98.2 F (36.8 C)   98 F (36.7 C)  TempSrc: Oral   Oral  SpO2: 93% (!) 89% 93% 92%  Weight:      Height:        Intake/Output Summary (Last 24 hours) at 06/24/2020 1831 Last data filed at 06/24/2020 1801 Gross per 24 hour  Intake 300 ml  Output 450 ml  Net -150 ml   Filed Weights   06/20/20 0811  Weight: 65.1 kg    Examination:  General exam: Appears calm and comfortable  Respiratory system: Clear to auscultation. Respiratory effort normal. Cardiovascular system: S1 & S2 heard, Regular Gastrointestinal system: Abdomen is nondistended, soft and nontender. No organomegaly or masses felt. Normal bowel sounds heard. Central nervous system: Alert . No focal neurological deficits. Extremities: Symmetric 5 x 5 power. Skin: No rashes, lesions Psychiatry: Judgement and insight appear normal. Mood & affect appropriate.   Data Reviewed: I have personally reviewed following labs and imaging studies  CBC: Recent Labs  Lab 06/19/20 1154 06/19/20 1605 06/20/20 0631 06/20/20 1405 06/21/20 0433 06/21/20 1532 06/22/20 0724 06/23/20 0513 06/24/20 0510  WBC 13.8*  --  9.6  --  8.5  --  10.1 8.8  --   NEUTROABS 12.3*  --   --   --   --   --   --   --   --   HGB 13.3   < > 12.0   < > 8.5* 8.5* 8.6* 7.8* 8.3*  HCT 40.3   < > 36.6   < > 26.2* 26.6* 28.4* 24.6* 26.1*  MCV 95.0  --  96.3  --   97.8  --  103.6* 99.6  --   PLT 164  --  154  --  148*  --  170 162  --    < > = values in this interval not displayed.   Basic Metabolic Panel: Recent Labs  Lab 06/19/20 1803 06/20/20 0631 06/21/20 0433 06/22/20 0724 06/23/20 0513 06/24/20 0510  NA  --  142 141 140 141 139  K  --  4.3 3.6 3.8 4.1 3.8  CL  --  105 105 106 110 108  CO2  --  _1 21*  GLUCOSE  --  98 145* 120* 132* 101*  BUN  --  14 16 25* 34* 28*  CREATININE  --  0.87 0.73 1.20* 0.92 0.72  CALCIUM  --  8.4* 8.2* 8.4* 8.6* 8.8*  MG 1.5*  --  2.0 2.0 2.0 2.0  PHOS  --   --  3.7 3.4 2.6 1.8*   GFR: Estimated Creatinine Clearance: 41.2 mL/min (by C-G formula based on SCr of 0.72 mg/dL). Liver Function Tests: Recent Labs  Lab 06/19/20 1154 06/21/20 0433 06/22/20 0724 06/23/20 0513 06/24/20 0510  AST 18  --   --   --   --   ALT 8  --   --   --   --   ALKPHOS 233*  --   --   --   --   BILITOT 1.3*  --   --   --   --   PROT 6.5  --   --   --   --   ALBUMIN 3.0* 2.6* 2.7* 2.7* 2.6*   No results for input(s): LIPASE, AMYLASE in the last 168 hours. No results for input(s): AMMONIA in the last 168 hours. Coagulation Profile: No results for input(s): INR, PROTIME in the last 168 hours. Cardiac Enzymes: No results for input(s): CKTOTAL, CKMB, CKMBINDEX, TROPONINI in the last 168 hours. BNP (last 3 results) No results for input(s): PROBNP in the last 8760 hours. HbA1C: No results for input(s): HGBA1C in the last 72 hours. CBG: Recent Labs  Lab 06/21/20 1642  GLUCAP 115*   Lipid Profile: No results for input(s): CHOL, HDL, LDLCALC, TRIG, CHOLHDL, LDLDIRECT in the last 72 hours. Thyroid Function Tests: No results for input(s): TSH, T4TOTAL, FREET4, T3FREE, THYROIDAB in the last 72 hours. Anemia Panel: Recent Labs    06/23/20 0513  VITAMINB12 1,038*  FOLATE 3.4*  FERRITIN 181  TIBC 227*  IRON 65  RETICCTPCT 2.8   Sepsis Labs: Recent Labs  Lab 06/19/20 1803 06/19/20 2145 06/21/20 1532  06/21/20 1920  LATICACIDVEN 2.3* 1.1 1.6 1.8    Recent Results (from the past 240 hour(s))  SARS CORONAVIRUS 2 (TAT 6-24 HRS) Nasopharyngeal Nasopharyngeal Swab     Status: None   Collection Time: 06/19/20 11:55 AM   Specimen: Nasopharyngeal Swab  Result Value Ref Range Status   SARS Coronavirus 2 NEGATIVE NEGATIVE Final    Comment: (NOTE) SARS-CoV-2 target nucleic acids are NOT DETECTED.  The SARS-CoV-2 RNA is generally detectable in upper and lower respiratory specimens during the acute phase of infection. Negative results do not preclude SARS-CoV-2 infection, do not rule out co-infections with other pathogens, and should not be used as the sole basis for treatment or other patient management decisions. Negative results must be combined with clinical observations, patient history, and epidemiological information. The expected result is Negative.  Fact Sheet for Patients: SugarRoll.be  Fact Sheet for Healthcare Providers: https://www.woods-mathews.com/  This test is not yet approved or cleared by the [place] FDA and  has been authorized for detection and/or diagnosis of SARS-CoV-2 by FDA under an Emergency Use Authorization (EUA). This EUA will remain  in effect (meaning this test can be used) for the duration of the COVID-19 declaration under Se ction 564(b)(1) of the Act, 21 U.S.C. section 360bbb-3(b)(1), unless the authorization is terminated or revoked sooner.  Performed at [facility], [place] [address], [place], [place] [address]   MRSA PCR Screening     Status: None   Collection Time: 06/19/20  9:15 PM   Specimen: Nasal Mucosa; Nasopharyngeal  Result Value Ref Range Status   MRSA by PCR NEGATIVE NEGATIVE Final    Comment:        The GeneXpert MRSA Assay (FDA approved for NASAL specimens only), is one component of a  comprehensive MRSA colonization surveillance program. It is not intended to diagnose  MRSA infection nor to guide or monitor treatment for MRSA infections. Performed at [facility], [place] [address], [place], [place] [address]      Radiology Studies: No results found.  Scheduled Meds: . (feeding supplement) PROSource Plus  30 mL Oral Daily  . acetaminophen  1,000 mg Oral Q8H  . Chlorhexidine Gluconate Cloth  6 each Topical Daily  . docusate sodium  100 mg Oral BID  . feeding supplement  237 mL Oral BID BM  . folic acid  1 mg Oral Daily  . hydrocortisone sod succinate (SOLU-CORTEF) inj  50 mg Intravenous Q12H   Followed by  . [START ON 06/25/2020] hydrocortisone sod succinate (SOLU-CORTEF) inj  50 mg Intravenous Daily  . metoprolol succinate  50 mg Oral q AM  . pantoprazole  40 mg Oral q AM   Continuous Infusions:   LOS: 5 days   Marylu Lund, MD [facility] Pager On Amion  If 7PM-7AM, please contact night-coverage 06/24/2020, 6:31 PM

## 2020-06-24 NOTE — Progress Notes (Signed)
Physical Therapy Treatment Patient Details Name: Jacqueline [name] MRN: [id number] DOB: [birth year] Today's Date: 06/24/2020    History of Present Illness Pt admitted from [facility] s/p fall wtih L femur pathologic fx and now s/p IM nail.  Pt with hx of dementia, a-fib, anxiety, CVA, and PAD    PT Comments    Pt oriented to self only. Limited progression d/t difficulty following commands/cognition. Continue to recommend SNF   Follow Up Recommendations  SNF     Equipment Recommendations  None recommended by PT    Recommendations for Other Services       Precautions / Restrictions Precautions Precautions: Fall Restrictions Weight Bearing Restrictions: No LLE Weight Bearing: Weight bearing as tolerated    Mobility  Bed Mobility Overal bed mobility: Needs Assistance Bed Mobility: Supine to Sit;Sit to Supine     Supine to sit: Max assist;+2 for physical assistance;+2 for safety/equipment Sit to supine: Max assist;+2 for physical assistance;+2 for safety/equipment   General bed mobility comments: assist to elevate trunk and control descent, assist to bring LEs on and off bed. limited ability to follow commands and assist self    Transfers                    Ambulation/Gait                 Stairs             Wheelchair Mobility    Modified Rankin (Stroke Patients Only)       Balance                                            Cognition Arousal/Alertness: Awake/alert Behavior During Therapy: Restless;Anxious Overall Cognitive Status: History of cognitive impairments - at baseline                                 General Comments: more confused today, poor STM, requires constant cues to stay on task. Ox1      Exercises      General Comments        Pertinent Vitals/Pain Pain Assessment: Faces Faces Pain Scale: Hurts little more Pain Location: L LE with movement Pain Descriptors / Indicators:  Grimacing;Guarding Pain Intervention(s): Limited activity within patient's tolerance;Monitored during session;Repositioned    Home Living                      Prior Function            PT Goals (current goals can now be found in the care plan section) Acute Rehab PT Goals Patient Stated Goal: No goals stated PT Goal Formulation: Patient unable to participate in goal setting Potential to Achieve Goals: Fair Progress towards PT goals: Not progressing toward goals - comment (cognition)    Frequency    Min 2X/week      PT Plan Current plan remains appropriate    Co-evaluation              AM-PAC PT "6 Clicks" Mobility   Outcome Measure  Help needed turning from your back to your side while in a flat bed without using bedrails?: A Lot Help needed moving from lying on your back to sitting on the side of a flat bed without using bedrails?: Total Help needed  moving to and from a bed to a chair (including a wheelchair)?: Total Help needed standing up from a chair using your arms (e.g., wheelchair or bedside chair)?: Total Help needed to walk in hospital room?: Total Help needed climbing 3-5 steps with a railing? : Total 6 Click Score: 7    End of Session   Activity Tolerance: Other (comment) (limited by cognition) Patient left: in bed;with call bell/phone within reach;with bed alarm set   PT Visit Diagnosis: Muscle weakness (generalized) (M62.81);Difficulty in walking, not elsewhere classified (R26.2);Pain Pain - Right/Left: Left Pain - part of body: Hip     Time: 1610-9604 PT Time Calculation (min) (ACUTE ONLY): 13 min  Charges:  $Therapeutic Activity: 8-22 mins                     Baxter Flattery, PT  Acute Rehab Dept ([place]) [phone] Pager [phone]  06/24/2020    [facility] 06/24/2020, 12:25 PM

## 2020-06-25 DIAGNOSIS — Z20822 Contact with and (suspected) exposure to covid-19: Secondary | ICD-10-CM | POA: Diagnosis present

## 2020-06-25 DIAGNOSIS — R0902 Hypoxemia: Secondary | ICD-10-CM | POA: Diagnosis not present

## 2020-06-25 DIAGNOSIS — F0390 Unspecified dementia without behavioral disturbance: Secondary | ICD-10-CM | POA: Diagnosis present

## 2020-06-25 DIAGNOSIS — I4891 Unspecified atrial fibrillation: Secondary | ICD-10-CM | POA: Diagnosis not present

## 2020-06-25 DIAGNOSIS — I2510 Atherosclerotic heart disease of native coronary artery without angina pectoris: Secondary | ICD-10-CM | POA: Diagnosis present

## 2020-06-25 DIAGNOSIS — Z4889 Encounter for other specified surgical aftercare: Secondary | ICD-10-CM | POA: Diagnosis not present

## 2020-06-25 DIAGNOSIS — F411 Generalized anxiety disorder: Secondary | ICD-10-CM | POA: Diagnosis present

## 2020-06-25 DIAGNOSIS — M8440XA Pathological fracture, unspecified site, initial encounter for fracture: Secondary | ICD-10-CM | POA: Diagnosis not present

## 2020-06-25 DIAGNOSIS — R279 Unspecified lack of coordination: Secondary | ICD-10-CM | POA: Diagnosis not present

## 2020-06-25 DIAGNOSIS — Z789 Other specified health status: Secondary | ICD-10-CM | POA: Diagnosis not present

## 2020-06-25 DIAGNOSIS — F22 Delusional disorders: Secondary | ICD-10-CM | POA: Diagnosis present

## 2020-06-25 DIAGNOSIS — Z743 Need for continuous supervision: Secondary | ICD-10-CM | POA: Diagnosis not present

## 2020-06-25 DIAGNOSIS — R64 Cachexia: Secondary | ICD-10-CM | POA: Diagnosis present

## 2020-06-25 DIAGNOSIS — R41841 Cognitive communication deficit: Secondary | ICD-10-CM | POA: Diagnosis not present

## 2020-06-25 DIAGNOSIS — R404 Transient alteration of awareness: Secondary | ICD-10-CM | POA: Diagnosis not present

## 2020-06-25 DIAGNOSIS — K219 Gastro-esophageal reflux disease without esophagitis: Secondary | ICD-10-CM | POA: Diagnosis present

## 2020-06-25 DIAGNOSIS — R5381 Other malaise: Secondary | ICD-10-CM | POA: Diagnosis not present

## 2020-06-25 DIAGNOSIS — M6281 Muscle weakness (generalized): Secondary | ICD-10-CM | POA: Diagnosis not present

## 2020-06-25 DIAGNOSIS — S72342D Displaced spiral fracture of shaft of left femur, subsequent encounter for closed fracture with routine healing: Secondary | ICD-10-CM | POA: Diagnosis not present

## 2020-06-25 DIAGNOSIS — I10 Essential (primary) hypertension: Secondary | ICD-10-CM | POA: Diagnosis not present

## 2020-06-25 DIAGNOSIS — R4020 Unspecified coma: Secondary | ICD-10-CM | POA: Diagnosis not present

## 2020-06-25 DIAGNOSIS — N179 Acute kidney failure, unspecified: Secondary | ICD-10-CM | POA: Diagnosis present

## 2020-06-25 DIAGNOSIS — M84452D Pathological fracture, left femur, subsequent encounter for fracture with routine healing: Secondary | ICD-10-CM | POA: Diagnosis present

## 2020-06-25 DIAGNOSIS — A419 Sepsis, unspecified organism: Secondary | ICD-10-CM | POA: Diagnosis present

## 2020-06-25 DIAGNOSIS — E860 Dehydration: Secondary | ICD-10-CM | POA: Diagnosis present

## 2020-06-25 DIAGNOSIS — I110 Hypertensive heart disease with heart failure: Secondary | ICD-10-CM | POA: Diagnosis present

## 2020-06-25 DIAGNOSIS — I959 Hypotension, unspecified: Secondary | ICD-10-CM | POA: Diagnosis present

## 2020-06-25 DIAGNOSIS — R4182 Altered mental status, unspecified: Secondary | ICD-10-CM | POA: Diagnosis present

## 2020-06-25 DIAGNOSIS — S72001F Fracture of unspecified part of neck of right femur, subsequent encounter for open fracture type IIIA, IIIB, or IIIC with routine healing: Secondary | ICD-10-CM | POA: Diagnosis not present

## 2020-06-25 DIAGNOSIS — I480 Paroxysmal atrial fibrillation: Secondary | ICD-10-CM | POA: Diagnosis present

## 2020-06-25 DIAGNOSIS — I5032 Chronic diastolic (congestive) heart failure: Secondary | ICD-10-CM | POA: Diagnosis present

## 2020-06-25 DIAGNOSIS — R1312 Dysphagia, oropharyngeal phase: Secondary | ICD-10-CM | POA: Diagnosis not present

## 2020-06-25 DIAGNOSIS — R6520 Severe sepsis without septic shock: Secondary | ICD-10-CM | POA: Diagnosis present

## 2020-06-25 DIAGNOSIS — Z7189 Other specified counseling: Secondary | ICD-10-CM | POA: Diagnosis not present

## 2020-06-25 DIAGNOSIS — Y95 Nosocomial condition: Secondary | ICD-10-CM | POA: Diagnosis present

## 2020-06-25 DIAGNOSIS — R29810 Facial weakness: Secondary | ICD-10-CM | POA: Diagnosis not present

## 2020-06-25 DIAGNOSIS — J189 Pneumonia, unspecified organism: Secondary | ICD-10-CM | POA: Diagnosis present

## 2020-06-25 DIAGNOSIS — R410 Disorientation, unspecified: Secondary | ICD-10-CM | POA: Diagnosis not present

## 2020-06-25 DIAGNOSIS — K449 Diaphragmatic hernia without obstruction or gangrene: Secondary | ICD-10-CM | POA: Diagnosis present

## 2020-06-25 DIAGNOSIS — I872 Venous insufficiency (chronic) (peripheral): Secondary | ICD-10-CM | POA: Diagnosis present

## 2020-06-25 DIAGNOSIS — C7951 Secondary malignant neoplasm of bone: Secondary | ICD-10-CM | POA: Diagnosis present

## 2020-06-25 DIAGNOSIS — R1310 Dysphagia, unspecified: Secondary | ICD-10-CM | POA: Diagnosis present

## 2020-06-25 DIAGNOSIS — Z66 Do not resuscitate: Secondary | ICD-10-CM | POA: Diagnosis present

## 2020-06-25 DIAGNOSIS — R0689 Other abnormalities of breathing: Secondary | ICD-10-CM | POA: Diagnosis not present

## 2020-06-25 DIAGNOSIS — Z515 Encounter for palliative care: Secondary | ICD-10-CM | POA: Diagnosis not present

## 2020-06-25 DIAGNOSIS — R2681 Unsteadiness on feet: Secondary | ICD-10-CM | POA: Diagnosis not present

## 2020-06-25 LAB — COMPREHENSIVE METABOLIC PANEL
ALT: 6 U/L (ref 0–44)
AST: 19 U/L (ref 15–41)
Albumin: 2.5 g/dL — ABNORMAL LOW (ref 3.5–5.0)
Alkaline Phosphatase: 147 U/L — ABNORMAL HIGH (ref 38–126)
Anion gap: 7 (ref 5–15)
BUN: 28 mg/dL — ABNORMAL HIGH (ref 8–23)
CO2: 26 mmol/L (ref 22–32)
Calcium: 8.9 mg/dL (ref 8.9–10.3)
Chloride: 109 mmol/L (ref 98–111)
Creatinine, Ser: 0.55 mg/dL (ref 0.44–1.00)
GFR, Estimated: >60 mL/min (ref >60)
Glucose, Bld: 117 mg/dL — ABNORMAL HIGH (ref 70–99)
Potassium: 4.2 mmol/L (ref 3.5–5.1)
Sodium: 142 mmol/L (ref 135–145)
Total Bilirubin: 0.8 mg/dL (ref 0.3–1.2)
Total Protein: 5.2 g/dL — ABNORMAL LOW (ref 6.5–8.1)

## 2020-06-25 LAB — CBC
HCT: 26 % — ABNORMAL LOW (ref 36.0–46.0)
Hemoglobin: 8.3 g/dL — ABNORMAL LOW (ref 12.0–15.0)
MCH: 31.7 pg (ref 26.0–34.0)
MCHC: 31.9 g/dL (ref 30.0–36.0)
MCV: 99.2 fL (ref 80.0–100.0)
Platelets: 195 10*3/uL (ref 150–400)
RBC: 2.62 MIL/uL — ABNORMAL LOW (ref 3.87–5.11)
RDW: 15.4 % (ref 11.5–15.5)
WBC: 11.3 10*3/uL — ABNORMAL HIGH (ref 4.0–10.5)
nRBC: 3.5 % — ABNORMAL HIGH (ref 0.0–0.2)

## 2020-06-25 MED ORDER — ALPRAZOLAM 0.5 MG PO TABS: 0.5000 mg | ORAL_TABLET | Freq: Two times a day (BID) | ORAL | 0 refills | Status: AC | PRN

## 2020-06-25 NOTE — Progress Notes (Signed)
Report called to [facility] [phone] with no answer. Floor number left in packet.

## 2020-06-25 NOTE — TOC Transition Note (Signed)
Transition of Care [facility]) - CM/SW Discharge Note   Patient Details  Name: Jacqueline [name] MRN: [id number] Date of Birth: Sep 16, 1928  [facility] CM/SW Contact:  Lennart Pall, LCSW Phone Number: 06/25/2020, 11:03 AM   Clinical Narrative:    Pt medically cleared for dc [facility] today.  PTAR has been called (11am) and son aware.  RN to call report to [phone].  No further TOC neds.   Final next level of care: Skilled Nursing Facility Barriers to Discharge: Barriers Resolved   Patient Goals and CMS Choice        Discharge Placement PASRR number recieved: 06/23/20            [facility] Patient to be transferred to facility by: [facility] Name of family member notified: son, [name] Patient and family notified of of transfer: 06/25/20  Discharge Plan and Services In-house Referral: Clinical Social Work              DME Arranged: N/A DME Agency: NA                  Social Determinants of Health ([place]) Interventions     Readmission Risk Interventions No flowsheet data found.

## 2020-06-25 NOTE — Discharge Summary (Signed)
Physician Discharge Summary  Jacqueline [name] ZOX:[id number] DOB: [birth year] DOA: 06/19/2020  PCP: Lajean Manes, MD  Admit date: 06/19/2020 Discharge date: 06/25/2020  Admitted From: SNF Disposition:  SNF  Recommendations for Outpatient Follow-up:  1. Follow up with PCP in 1-2 weeks 2. Follow up with Orthopedic Surgery as scheduled  Discharge Condition:Stable CODE STATUS:DNR Diet recommendation: Heart healthy   Brief/Interim Summary: 87 year old F with PMH of A. fib on Eliquis, dementia, HTN, anxiety and GERD brought to ED from SNF with left thigh pain. She heard a loud pop when she pivoted on left leg for transfer. X-ray and CT showed displaced, foreshortened and angulated left femoral shaft fracture concerning for pathologic fracture. Orthopedic surgery consulted. CT chest, abdomen and pelvis revealed additional aggressive bone lesions (T8, left third rib, L2, upper right ilium and left ilium) with no primary malignancy within chest or abdomen. She underwent intramedullary fixation of left femoral by Dr. Lyla Glassing on 2/19. Oncology, Dr. Irene Limbo recommended myeloma panel, light chains and outpatient follow-up after pathology result.   Hospital course noteworthy of delirium, hypotension,arterial insufficiency and anemia.  Discharge Diagnoses:  Active Problems:   Femur fracture (HCC)   Hip fracture (HCC)  Displaced and angulated left femoral shaft fracture-likely pathologic given additional aggressive bone lesions elsewhere as noted above. She has elevated alk phos with normal GGT.  -S/p intramedullary fixation of left humeral shaft fracture -Was continued on analgesia as needed -SCD for VTEprophylaxis while in hospital -Oncology, Dr. Irene Limbo recs: myeloma panel, light chains, both reviewed and are unremarkable.  -Surgical pathology remains pending.  -Oncology recommends outpt f/u -Continue bowel regimen.  Possible adrenal insufficiency/surgical distress:Random continue 2.7. A.m.  cortisol 4.4. -Continue stress dose steroid tapering -Follow ACTH.  Hypotension:Likely due to adrenal insufficiency/surgical stress. Resolved after starting stress dose steroid. Likely due to adrenal insufficiency. TSH and lactic acid within normal.TTE without significant finding. -Continue holding home Cardizem. -Metoprolol resumed  AKI: Likely prerenal from hypotension. -resolved with IVF  Acute blood loss anemia -no obvious signs of bleeding currently, likely post-op -Hgb remains stable -Follow CBC trends and if stable, would resume eliquis per below  Paroxysmal A. fib: Rate controlled. -Cardizem,on hold -Resumed metoprolol -CBC remained stable. Resume eliquis on d/c  Elevated alk phos with normal GGT-likely osseous source -Evaluation as above  Hypokalemia/hypomagnesemia:Resolved.  Hyperglycemia without diagnosis of diabetes: A1c 5.2%. Resolved.  Lactic acidosis: Likely mediated by hypotension. No evidence of infection. Resolved.  Dementia with behavioral disturbance/delirium -Reorientation and delirium precautions.  Anxiety-takes Xanax at home. -Continue reduced dose of this -Limited quantity of xanax to be prescribed to SNF as prescriptions of controlled substances are required for SNF placement  Goal of care/DNR/DNI-appropriate. -Further goal of care discussion based on clinical course and pathology finding  Discharge Instructions   Allergies as of 06/25/2020      Reactions   Metoclopramide Hcl Other (See Comments)    Tremors   Penicillins Itching, Other (See Comments)   "Allergic," per [facility] Has patient had a PCN reaction causing immediate rash, facial/tongue/throat swelling, SOB or lightheadedness with hypotension: Yes Has patient had a PCN reaction causing severe rash involving mucus membranes or skin necrosis: Yes Has patient had a PCN reaction that required hospitalization:No Has patient had a PCN reaction occurring within the last 10  years: No If all of the above answers are "NO", then may proceed with Cephalosporin use.   Aspirin Other (See Comments)   "gave me stomach aches; made me bleed" (03/13/12)- "Allergic," per [facility]   Zetia [ezetimibe] Other (  See Comments)   Stomach and back pain      Medication List    STOP taking these medications   diltiazem 360 MG 24 hr capsule Commonly known as: CARDIZEM CD     TAKE these medications   acetaminophen 500 MG tablet Commonly known as: TYLENOL Take 1,000 mg by mouth every 8 (eight) hours as needed for mild pain or fever (for pain).   ALPRAZolam 0.5 MG tablet Commonly known as: XANAX Take 1 tablet (0.5 mg total) by mouth 2 (two) times daily as needed for anxiety. What changed: when to take this   apixaban 5 MG Tabs tablet Commonly known as: ELIQUIS Take 1 tablet (5 mg total) by mouth 2 (two) times daily.   cholecalciferol 25 MCG (1000 UNIT) tablet Commonly known as: VITAMIN D3 Take 1,000 Units by mouth daily.   diclofenac Sodium 1 % Gel Commonly known as: VOLTAREN Apply 2 g topically 4 (four) times daily.   docusate sodium 100 MG capsule Commonly known as: COLACE Take 200 mg by mouth daily.   furosemide 20 MG tablet Commonly known as: LASIX Take 1 tablet (20 mg total) by mouth daily.   HYDROcodone-acetaminophen 5-325 MG tablet Commonly known as: NORCO/VICODIN Take 1-2 tablets by mouth every 4 (four) hours as needed for moderate pain (pain score 4-6).   iron polysaccharides 150 MG capsule Commonly known as: NIFEREX Take 1 capsule (150 mg total) by mouth daily.   metoprolol succinate 50 MG 24 hr tablet Commonly known as: TOPROL-XL Take 50 mg by mouth in the morning.   pantoprazole 40 MG tablet Commonly known as: PROTONIX TAKE 1 TABLET EVERY MORNING What changed:   how much to take  how to take this  when to take this  additional instructions   polyethylene glycol 17 g packet Commonly known as: MIRALAX / GLYCOLAX Take 17 g by mouth daily  as needed for mild constipation (MIX AND DRINK).   Systane 0.4-0.3 % Soln Generic drug: Polyethyl Glycol-Propyl Glycol Place 2 drops into both eyes every 8 (eight) hours as needed (for dry eyes).       Contact information for follow-up providers    Swinteck, Aaron Edelman, MD. Schedule an appointment as soon as possible for a visit in 2 weeks.   Specialty: Orthopedic Surgery Why: For wound re-check, For suture removal Contact information: [address] [place] [address] [phone]            Contact information for after-discharge care    Destination    [facility] .   Service: Skilled Nursing Contact information: [address] [place] [phone]                 Allergies  Allergen Reactions  . Metoclopramide Hcl Other (See Comments)     Tremors  . Penicillins Itching and Other (See Comments)    "Allergic," per [facility] Has patient had a PCN reaction causing immediate rash, facial/tongue/throat swelling, SOB or lightheadedness with hypotension: Yes Has patient had a PCN reaction causing severe rash involving mucus membranes or skin necrosis: Yes Has patient had a PCN reaction that required hospitalization:No Has patient had a PCN reaction occurring within the last 10 years: No If all of the above answers are "NO", then may proceed with Cephalosporin use.   . Aspirin Other (See Comments)    "gave me stomach aches; made me bleed" (03/13/12)- "Allergic," per [facility]  . Zetia [Ezetimibe] Other (See Comments)    Stomach  and back pain    Consultations:  Orthopedic Surgery  Procedures/Studies: CT ABDOMEN PELVIS WO CONTRAST  Result Date: 06/20/2020 CLINICAL DATA:  Cancer of unknown primary. Pathologic femur fracture. EXAM: CT CHEST, ABDOMEN AND PELVIS WITHOUT CONTRAST TECHNIQUE: Multidetector CT imaging of the chest, abdomen and pelvis was performed following the standard protocol without IV contrast. COMPARISON:   Femur CT from yesterday. FINDINGS: CT CHEST FINDINGS Cardiovascular: Normal heart size. Heavily calcified aorta and great vessels. Coronary atherosclerosis. No acute vascular finding without contrast. Dilated upper descending aorta measuring up to 3.9 cm in diameter. Mediastinum/Nodes: Large hiatal hernia containing stomach and pancreas. Lungs/Pleura: Bandlike opacities most consistent with atelectasis. No definite pulmonary mass or airway cut off. There is no edema, consolidation, effusion, or pneumothorax. Musculoskeletal: T8 lytic lesion within the body with superior endplate fracture. Posterior left third rib fracture with underlying lytic lesion and permeative appearance. Advanced spinal degeneration. Severe bilateral glenohumeral osteoarthritis. CT ABDOMEN PELVIS FINDINGS Hepatobiliary: No focal liver abnormality.Full gallbladder without calcified stone or acute inflammation. Distended common bile duct without internal filling defect seen. Pancreas: Generalized atrophy. Partial herniation into the chest via a hiatal hernia. Spleen: Unremarkable. Adrenals/Urinary Tract: Negative adrenals. No hydronephrosis or stone. Unremarkable bladder. Stomach/Bowel: Severe distal colonic diverticulosis. No obstruction or visible mass. No visible bowel inflammation. Vascular/Lymphatic: No acute vascular abnormality. Extensive atheromatous calcification of the aorta. No mass or adenopathy. Reproductive:No pathologic findings. Other: Lax abdominal wall with probable very broad spigelian hernia on the right containing multiple small bowel loops. Musculoskeletal: Aggressive/permeative lesion in the upper right ilium. Known left femur fracture which is partially covered on this study. There is likely a mixed sclerotic and lucent lesion in the left ilium. Lytic lesion in the L2 vertebral body without acute fracture. Given osteopenia, there could be even more extensive and obscured spinal disease. Advanced lumbar spine degeneration  with scoliosis. IMPRESSION: 1. Additional aggressive bone lesions compatible with metastatic disease. No primary malignancy is seen within the chest and abdominal viscera. 2. Incidental findings are described above. Electronically Signed   By: Monte Fantasia M.D.   On: 06/20/2020 08:04   DG Chest 1 View  Result Date: 06/19/2020 CLINICAL DATA:  Fall EXAM: CHEST  1 VIEW COMPARISON:  December 29, 2019 FINDINGS: Evaluation is limited secondary to patient rotation. Revisualization of cardiomegaly. Triangular opacity along the RIGHT perihilar border is similar in comparison to prior when accounting for differences in patient rotation. Elevation of the RIGHT hemidiaphragm, unchanged. Atherosclerotic calcifications of the aorta. Large hiatal hernia. Reticulonodular opacities of the RIGHT lung periphery. Hazy opacity overlying the LEFT upper lung, likely due to skin fold given persistence of lung markings distal to this opacity. No definitive pneumothorax or pleural effusion. Lucency traversing the proximal RIGHT humerus. Advanced degenerative changes of the RIGHT humerus. Status post ORIF of the distal RIGHT humerus. Degenerative changes of the thoracic spine. IMPRESSION: 1. Lucency traversing the proximal RIGHT humerus may reflect a nondisplaced fracture. Consider additional dedicated radiographs for further evaluation. 2. Similar appearance of enlarged cardiomediastinal silhouette although evaluation is limited secondary to patient rotation. Mildly increased reticulonodular opacities of the RIGHT lung periphery likely reflecting aspiration or atelectasis. Superimposed infection could present similarly. Electronically Signed   By: Valentino Saxon MD   On: 06/19/2020 12:37   CT CHEST WO CONTRAST  Result Date: 06/20/2020 CLINICAL DATA:  Cancer of unknown primary. Pathologic femur fracture. EXAM: CT CHEST, ABDOMEN AND PELVIS WITHOUT CONTRAST TECHNIQUE: Multidetector CT imaging of the chest, abdomen and pelvis was  performed following  the standard protocol without IV contrast. COMPARISON:  Femur CT from yesterday. FINDINGS: CT CHEST FINDINGS Cardiovascular: Normal heart size. Heavily calcified aorta and great vessels. Coronary atherosclerosis. No acute vascular finding without contrast. Dilated upper descending aorta measuring up to 3.9 cm in diameter. Mediastinum/Nodes: Large hiatal hernia containing stomach and pancreas. Lungs/Pleura: Bandlike opacities most consistent with atelectasis. No definite pulmonary mass or airway cut off. There is no edema, consolidation, effusion, or pneumothorax. Musculoskeletal: T8 lytic lesion within the body with superior endplate fracture. Posterior left third rib fracture with underlying lytic lesion and permeative appearance. Advanced spinal degeneration. Severe bilateral glenohumeral osteoarthritis. CT ABDOMEN PELVIS FINDINGS Hepatobiliary: No focal liver abnormality.Full gallbladder without calcified stone or acute inflammation. Distended common bile duct without internal filling defect seen. Pancreas: Generalized atrophy. Partial herniation into the chest via a hiatal hernia. Spleen: Unremarkable. Adrenals/Urinary Tract: Negative adrenals. No hydronephrosis or stone. Unremarkable bladder. Stomach/Bowel: Severe distal colonic diverticulosis. No obstruction or visible mass. No visible bowel inflammation. Vascular/Lymphatic: No acute vascular abnormality. Extensive atheromatous calcification of the aorta. No mass or adenopathy. Reproductive:No pathologic findings. Other: Lax abdominal wall with probable very broad spigelian hernia on the right containing multiple small bowel loops. Musculoskeletal: Aggressive/permeative lesion in the upper right ilium. Known left femur fracture which is partially covered on this study. There is likely a mixed sclerotic and lucent lesion in the left ilium. Lytic lesion in the L2 vertebral body without acute fracture. Given osteopenia, there could be even  more extensive and obscured spinal disease. Advanced lumbar spine degeneration with scoliosis. IMPRESSION: 1. Additional aggressive bone lesions compatible with metastatic disease. No primary malignancy is seen within the chest and abdominal viscera. 2. Incidental findings are described above. Electronically Signed   By: Monte Fantasia M.D.   On: 06/20/2020 08:04   CT FEMUR LEFT WO CONTRAST  Result Date: 06/19/2020 CLINICAL DATA:  Fall EXAM: CT OF THE LOWER LEFT EXTREMITY WITHOUT CONTRAST TECHNIQUE: Multidetector CT imaging of the lower left extremity was performed according to the standard protocol. COMPARISON:  Radiographs 06/19/2020 FINDINGS: Bones/Joint/Cartilage Redemonstration of the internally rotated, posterolaterally displaced, foreshortened and varus angulated mid femoral diaphyseal fracture. While the bones appear diffusely demineralized, there is a more focally permeative appearance of the cortex and intermediate medullary attenuation about the fracture line. While some of this may be attributable to intramedullary hemorrhage and demineralization, the overall appearance is highly conspicuous for an underlying pathologic fracture. No additional acute fracture is seen. Included bones of the pelvis appear intact and congruent. Proximal femoral heads are normally located. Severe left and moderate right hip osteoarthrosis. Moderate tricompartmental degenerative changes noted in the right knee. Additional degenerative changes noted incidentally included left hand and wrist. Ligaments Suboptimally assessed by CT. Muscles and Tendons Intramuscular thickening and hemorrhage with stranding about the fracture line. Ligamentous and muscular laxity related to foreshortening and displacement across the fracture line. No fully retracted or torn tendons are identified. Soft tissues Markedly suboptimal assessment of the vasculature in the absence of contrast media. Extensive atherosclerotic calcification helps to  delineate the course of the superficial femoral and popliteal arteries as well as the proximal runoff vessels without clear surrounding hemorrhage or hematoma. Stranding, thickening and swelling adjacent the fracture line. Included portions of the abdomen and pelvis demonstrate significant abdominal wall laxity and some laxity of the pelvic floor as well. Extensive colonic diverticulosis. Some questionable stranding noted adjacent the proximal sigmoid, nonspecific. Vascular calcium in the pelvis. IMPRESSION: 1. Redemonstration of the internally rotated, posterolaterally displaced, foreshortened  and varus angulated mid femoral diaphyseal fracture. Focally permeative appearance of the cortex and intermediate medullary attenuation about the fracture line; while some of this may be attributable to intramedullary hemorrhage and demineralization, the overall appearance is highly conspicuous for an underlying pathologic fracture. 2. No additional acute fracture is seen. 3. Markedly suboptimal assessment of the vasculature in the absence of contrast media. Extensive atherosclerotic calcification helps to delineate the course of the superficial femoral and popliteal arteries as well as the proximal runoff vessels without clear surrounding hemorrhage or hematoma. Correlate with pulse assessment and clinical exam findings. 4. Severe left and moderate right hip osteoarthrosis. 5. Extensive colonic diverticulosis with questionable stranding adjacent the proximal sigmoid, nonspecific. Correlate for symptoms of acute diverticulitis. 6. Pelvic floor and abdominal wall laxity. Electronically Signed   By: Lovena Le M.D.   On: 06/19/2020 23:07   DG Humerus Right  Result Date: 06/19/2020 CLINICAL DATA:  Fall today. EXAM: RIGHT HUMERUS - 2+ VIEW COMPARISON:  None. FINDINGS: Status post surgical internal fixation of old distal right humeral fracture. Severe degenerative changes seen involving the right glenohumeral joint. No acute  fracture or dislocation is noted. No soft tissue abnormality is noted. IMPRESSION: Status post surgical internal fixation of old distal right humeral fracture. Severe degenerative joint disease of the right glenohumeral joint. No acute fracture or dislocation is noted. Electronically Signed   By: Marijo Conception M.D.   On: 06/19/2020 13:12   DG C-Arm 1-60 Min-No Report  Result Date: 06/20/2020 Fluoroscopy was utilized by the requesting physician.  No radiographic interpretation.   ECHOCARDIOGRAM COMPLETE  Result Date: 06/22/2020    ECHOCARDIOGRAM REPORT   Patient Name:   Jacqueline [name] Date of Exam: 06/22/2020 Medical Rec #:  [id number]  Height:       61.0 in Accession #:    [id number] Weight:       143.5 lb Date of Birth:  [birth year]   BSA:          1.640 m Patient Age:    33 years   BP:           126/73 mmHg Patient Gender: F          HR:           108 bpm. Exam Location:  Inpatient Procedure: 2D Echo Indications:    Hypotension  History:        Patient has prior history of Echocardiogram examinations, most                 recent 12/25/2018. Arrythmias:Atrial Fibrillation;                 Signs/Symptoms:Altered Mental Status.  Referring Phys: [id number] Charlesetta Ivory GONFA  Sonographer Comments: Technically challenging study due to limited acoustic windows. Image acquisition challenging due to uncooperative patient. IMPRESSIONS  1. LV function is vigorous with near cavity obliteration during systole. Left ventricular ejection fraction, by estimation, is >75%. The left ventricle has hyperdynamic function. The left ventricle has no regional wall motion abnormalities. Left ventricular diastolic parameters are consistent with Grade I diastolic dysfunction (impaired relaxation).  2. Right ventricular systolic function is normal. The right ventricular size is normal. There is severely elevated pulmonary artery systolic pressure.  3. Left atrial size was severely dilated.  4. The mitral valve is abnormal. Trivial mitral valve  regurgitation.  5. The aortic valve was not well visualized. Aortic valve regurgitation is not visualized. No aortic stenosis is present. FINDINGS  Left Ventricle: LV  function is vigorous with near cavity obliteration during systole. Left ventricular ejection fraction, by estimation, is >75%. The left ventricle has hyperdynamic function. The left ventricle has no regional wall motion abnormalities.  The left ventricular internal cavity size was small. There is no left ventricular hypertrophy. Left ventricular diastolic parameters are consistent with Grade I diastolic dysfunction (impaired relaxation). Right Ventricle: The right ventricular size is normal. Right vetricular wall thickness was not assessed. Right ventricular systolic function is normal. There is severely elevated pulmonary artery systolic pressure. The tricuspid regurgitant velocity is 3.68 m/s, and with an assumed right atrial pressure of 15 mmHg, the estimated right ventricular systolic pressure is 82.4 mmHg. Left Atrium: Left atrial size was severely dilated. Right Atrium: Right atrial size was normal in size. Pericardium: There is no evidence of pericardial effusion. Mitral Valve: The mitral valve is abnormal. There is mild thickening of the mitral valve leaflet(s). Mild mitral annular calcification. Trivial mitral valve regurgitation. Tricuspid Valve: The tricuspid valve is not well visualized. Tricuspid valve regurgitation is mild. Aortic Valve: The aortic valve was not well visualized. Aortic valve regurgitation is not visualized. No aortic stenosis is present. Pulmonic Valve: The pulmonic valve was not well visualized. Aorta: Aortic root could not be assessed. IAS/Shunts: The interatrial septum was not assessed.   Diastology LV e' medial:    4.46 cm/s LV E/e' medial:  21.7 LV e' lateral:   5.44 cm/s LV E/e' lateral: 17.8  RIGHT VENTRICLE TAPSE (M-mode): 0.8 cm LEFT ATRIUM              Index       RIGHT ATRIUM          Index LA Vol (A2C):   74.0  ml  45.12 ml/m RA Area:     9.51 cm LA Vol (A4C):   119.0 ml 72.55 ml/m RA Volume:   15.20 ml 9.27 ml/m LA Biplane Vol: 97.1 ml  59.20 ml/m  AORTIC VALVE LVOT Vmax:   151.00 cm/s LVOT Vmean:  103.000 cm/s LVOT VTI:    0.267 m MITRAL VALVE                TRICUSPID VALVE MV Area (PHT): 2.36 cm     TR Peak grad:   54.2 mmHg MV Decel Time: 322 msec     TR Vmax:        368.00 cm/s MV E velocity: 96.80 cm/s MV A velocity: 139.00 cm/s  SHUNTS MV E/A ratio:  0.70         Systemic VTI: 0.27 m Dorris Carnes MD Electronically signed by Dorris Carnes MD Signature Date/Time: 06/22/2020/7:11:43 PM    Final    DG HIP OPERATIVE UNILAT W OR W/O PELVIS LEFT  Result Date: 06/20/2020 CLINICAL DATA:  87 year old female with hip fracture EXAM: OPERATIVE LEFT HIP (WITH PELVIS IF PERFORMED)  VIEWS TECHNIQUE: Fluoroscopic spot image(s) were submitted for interpretation post-operatively. COMPARISON:  None. FINDINGS: Multiple intraoperative fluoroscopic spot images. Surgical changes of antegrade intramedullary rod placement for left femoral fracture fixation. Cannulated interlocking screw at the femoral neck with distal interlocking screw of the femur. Alignment relatively restored in the mid femur. There appears to be refraction of the cortex at the fracture site, which is poorly characterized on fluoroscopic images, better characterized on prior CT. IMPRESSION: Limited intraoperative fluoroscopic spot images of ORIF of left femoral pathologic fracture. Alignment relatively restored. Please refer to the dictated operative report for full details of intraoperative findings and procedure. Electronically Signed   By:  Corrie Mckusick D.O.   On: 06/20/2020 14:30   DG Hip Unilat W or Wo Pelvis 2-3 Views Left  Result Date: 06/19/2020 CLINICAL DATA:  Fall EXAM: DG HIP (WITH OR WITHOUT PELVIS) 2-3V LEFT; LEFT FEMUR 2 VIEWS COMPARISON:  None. FINDINGS: Evaluation is limited secondary to patient rotation. Osteopenia. There is a foreshortened  fracture of the mid femoral shaft. Distal fragment is displaced medially by 1.5 shaft widths. It is foreshortened by approximately 6 cm. There is apex ventral angulation. There is limited assessment of the LEFT hip joint due to patient rotation. Femoral head appears to be located within the acetabulum. Overlying bowel gas limits evaluation of the sacrum. Degenerative changes of the lower lumbar spine. Vascular calcifications. IMPRESSION: Foreshortened, displaced and angulated fracture of the mid left femoral shaft. If persistent concern for nondisplaced hip or pelvic fracture, recommend dedicated pelvic MRI or CT. Electronically Signed   By: Valentino Saxon MD   On: 06/19/2020 12:45   DG Femur Min 2 Views Left  Result Date: 06/19/2020 CLINICAL DATA:  Fall EXAM: DG HIP (WITH OR WITHOUT PELVIS) 2-3V LEFT; LEFT FEMUR 2 VIEWS COMPARISON:  None. FINDINGS: Evaluation is limited secondary to patient rotation. Osteopenia. There is a foreshortened fracture of the mid femoral shaft. Distal fragment is displaced medially by 1.5 shaft widths. It is foreshortened by approximately 6 cm. There is apex ventral angulation. There is limited assessment of the LEFT hip joint due to patient rotation. Femoral head appears to be located within the acetabulum. Overlying bowel gas limits evaluation of the sacrum. Degenerative changes of the lower lumbar spine. Vascular calcifications. IMPRESSION: Foreshortened, displaced and angulated fracture of the mid left femoral shaft. If persistent concern for nondisplaced hip or pelvic fracture, recommend dedicated pelvic MRI or CT. Electronically Signed   By: Valentino Saxon MD   On: 06/19/2020 12:45   DG FEMUR PORT MIN 2 VIEWS LEFT  Result Date: 06/20/2020 CLINICAL DATA:  87 year old female status post left hip ORIF. EXAM: LEFT FEMUR PORTABLE 2 VIEWS COMPARISON:  None. FINDINGS: Status post ORIF of the left femoral diaphysis fracture. The hardware is intact. The bones are osteopenic.  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 Postsurgical changes of the soft tissues of the left hip and cutaneous clips. IMPRESSION: Status post ORIF of the left femoral diaphysis fracture. Electronically Signed   By: Anner Crete M.D.   On: 06/20/2020 15:04     Subjective: Without complaints  Discharge Exam: Vitals:   06/24/20 2130 06/25/20 0612  BP: (!) 99/59 132/87  Pulse: 90 95  Resp: 16 16  Temp:  97.9 F (36.6 C)  SpO2: 95% 100%   Vitals:   06/24/20 0613 06/24/20 1320 06/24/20 2130 06/25/20 0612  BP: 130/83 137/76 (!) 99/59 132/87  Pulse: (!) 101 97 90 95  Resp: '16 17 16 16  ' Temp:  98 F (36.7 C)  97.9 F (36.6 C)  TempSrc:  Oral  Oral  SpO2: 93% 92% 95% 100%  Weight:      Height:        General: Pt is alert, awake, not in acute distress Cardiovascular: RRR, S1/S2 +, no rubs, no gallops Respiratory: CTA bilaterally, no wheezing, no rhonchi Abdominal: Soft, NT, ND, bowel sounds + Extremities: no edema, no cyanosis   The results of significant diagnostics from this hospitalization (including imaging, microbiology, ancillary and laboratory) are listed below for reference.     Microbiology: Recent Results (from the past 240 hour(s))  SARS CORONAVIRUS  2 (TAT 6-24 HRS) Nasopharyngeal Nasopharyngeal Swab     Status: None   Collection Time: 06/19/20 11:55 AM   Specimen: Nasopharyngeal Swab  Result Value Ref Range Status   SARS Coronavirus 2 NEGATIVE NEGATIVE Final    Comment: (NOTE) SARS-CoV-2 target nucleic acids are NOT DETECTED.  The SARS-CoV-2 RNA is generally detectable in upper and lower respiratory specimens during the acute phase of infection. Negative results do not preclude SARS-CoV-2 infection, do not rule out co-infections with other pathogens, and should not be used as the sole basis for treatment or other patient management decisions. Negative results must be combined with clinical observations, patient history, and epidemiological  information. The expected result is Negative.  Fact Sheet for Patients: SugarRoll.be  Fact Sheet for Healthcare Providers: https://www.woods-mathews.com/  This test is not yet approved or cleared by the [place] FDA and  has been authorized for detection and/or diagnosis of SARS-CoV-2 by FDA under an Emergency Use Authorization (EUA). This EUA will remain  in effect (meaning this test can be used) for the duration of the COVID-19 declaration under Se ction 564(b)(1) of the Act, 21 U.S.C. section 360bbb-3(b)(1), unless the authorization is terminated or revoked sooner.  Performed at [facility], [place] [address], [place], [place] [address]   MRSA PCR Screening     Status: None   Collection Time: 06/19/20  9:15 PM   Specimen: Nasal Mucosa; Nasopharyngeal  Result Value Ref Range Status   MRSA by PCR NEGATIVE NEGATIVE Final    Comment:        The GeneXpert MRSA Assay (FDA approved for NASAL specimens only), is one component of a comprehensive MRSA colonization surveillance program. It is not intended to diagnose MRSA infection nor to guide or monitor treatment for MRSA infections. Performed at [facility], [place] [address] [place], [place] [address]   SARS CORONAVIRUS 2 (TAT 6-24 HRS) Nasopharyngeal Nasopharyngeal Swab     Status: None   Collection Time: 06/24/20 12:39 PM   Specimen: Nasopharyngeal Swab  Result Value Ref Range Status   SARS Coronavirus 2 NEGATIVE NEGATIVE Final    Comment: (NOTE) SARS-CoV-2 target nucleic acids are NOT DETECTED.  The SARS-CoV-2 RNA is generally detectable in upper and lower respiratory specimens during the acute phase of infection. Negative results do not preclude SARS-CoV-2 infection, do not rule out co-infections with other pathogens, and should not be used as the sole basis for treatment or other patient management decisions. Negative results must be combined with  clinical observations, patient history, and epidemiological information. The expected result is Negative.  Fact Sheet for Patients: SugarRoll.be  Fact Sheet for Healthcare Providers: https://www.woods-mathews.com/  This test is not yet approved or cleared by the [place] FDA and  has been authorized for detection and/or diagnosis of SARS-CoV-2 by FDA under an Emergency Use Authorization (EUA). This EUA will remain  in effect (meaning this test can be used) for the duration of the COVID-19 declaration under Se ction 564(b)(1) of the Act, 21 U.S.C. section 360bbb-3(b)(1), unless the authorization is terminated or revoked sooner.  Performed at [facility], [place] [address], [place], [place] [address]      Labs: BNP (last 3 results) Recent Labs    06/21/20 1532  BNP 742.5*   Basic Metabolic Panel: Recent Labs  Lab 06/19/20 1803 06/20/20 0631 06/21/20 0433 06/22/20 0724 06/23/20 0513 06/24/20 0510 06/25/20 0433  NA  --    < > 141 140 141 139 142  K  --    < > 3.6 3.8 4.1 3.8 4.2  CL  --    < > 105 106 110 108 109  CO2  --    < > '26 23 23 ' 21* 26  GLUCOSE  --    < > 145* 120* 132* 101* 117*  BUN  --    < > 16 25* 34* 28* 28*  CREATININE  --    < > 0.73 1.20* 0.92 0.72 0.55  CALCIUM  --    < > 8.2* 8.4* 8.6* 8.8* 8.9  MG 1.5*  --  2.0 2.0 2.0 2.0  --   PHOS  --   --  3.7 3.4 2.6 1.8*  --    < > = values in this interval not displayed.   Liver Function Tests: Recent Labs  Lab 06/19/20 1154 06/21/20 0433 06/22/20 0724 06/23/20 0513 06/24/20 0510 06/25/20 0433  AST 18  --   --   --   --  19  ALT 8  --   --   --   --  6  ALKPHOS 233*  --   --   --   --  147*  BILITOT 1.3*  --   --   --   --  0.8  PROT 6.5  --   --   --   --  5.2*  ALBUMIN 3.0* 2.6* 2.7* 2.7* 2.6* 2.5*   No results for input(s): LIPASE, AMYLASE in the last 168 hours. No results for input(s): AMMONIA in the last 168 hours. CBC: Recent Labs   Lab 06/19/20 1154 06/19/20 1605 06/20/20 0631 06/20/20 1405 06/21/20 0433 06/21/20 1532 06/22/20 0724 06/23/20 0513 06/24/20 0510 06/25/20 0433  WBC 13.8*  --  9.6  --  8.5  --  10.1 8.8  --  11.3*  NEUTROABS 12.3*  --   --   --   --   --   --   --   --   --   HGB 13.3   < > 12.0   < > 8.5* 8.5* 8.6* 7.8* 8.3* 8.3*  HCT 40.3   < > 36.6   < > 26.2* 26.6* 28.4* 24.6* 26.1* 26.0*  MCV 95.0  --  96.3  --  97.8  --  103.6* 99.6  --  99.2  PLT 164  --  154  --  148*  --  170 162  --  195   < > = values in this interval not displayed.   Cardiac Enzymes: No results for input(s): CKTOTAL, CKMB, CKMBINDEX, TROPONINI in the last 168 hours. BNP: Invalid input(s): POCBNP CBG: Recent Labs  Lab 06/21/20 1642  GLUCAP 115*   D-Dimer No results for input(s): DDIMER in the last 72 hours. Hgb A1c No results for input(s): HGBA1C in the last 72 hours. Lipid Profile No results for input(s): CHOL, HDL, LDLCALC, TRIG, CHOLHDL, LDLDIRECT in the last 72 hours. Thyroid function studies No results for input(s): TSH, T4TOTAL, T3FREE, THYROIDAB in the last 72 hours.  Invalid input(s): FREET3 Anemia work up Recent Labs    06/23/20 0513  VITAMINB12 1,038*  FOLATE 3.4*  FERRITIN 181  TIBC 227*  IRON 65  RETICCTPCT 2.8   Urinalysis    Component Value Date/Time   COLORURINE YELLOW 06/19/2020 2115   APPEARANCEUR CLEAR 06/19/2020 2115   LABSPEC 1.018 06/19/2020 2115   PHURINE 6.0 06/19/2020 2115   GLUCOSEU NEGATIVE 06/19/2020 2115   HGBUR NEGATIVE 06/19/2020 2115   BILIRUBINUR  NEGATIVE 06/19/2020 2115   KETONESUR NEGATIVE 06/19/2020 2115   PROTEINUR NEGATIVE 06/19/2020 2115   UROBILINOGEN 0.2 06/18/2014 1950   NITRITE NEGATIVE 06/19/2020 2115   LEUKOCYTESUR NEGATIVE 06/19/2020 2115   Sepsis Labs Invalid input(s): PROCALCITONIN,  WBC,  LACTICIDVEN Microbiology Recent Results (from the past 240 hour(s))  SARS CORONAVIRUS 2 (TAT 6-24 HRS) Nasopharyngeal Nasopharyngeal Swab     Status:  None   Collection Time: 06/19/20 11:55 AM   Specimen: Nasopharyngeal Swab  Result Value Ref Range Status   SARS Coronavirus 2 NEGATIVE NEGATIVE Final    Comment: (NOTE) SARS-CoV-2 target nucleic acids are NOT DETECTED.  The SARS-CoV-2 RNA is generally detectable in upper and lower respiratory specimens during the acute phase of infection. Negative results do not preclude SARS-CoV-2 infection, do not rule out co-infections with other pathogens, and should not be used as the sole basis for treatment or other patient management decisions. Negative results must be combined with clinical observations, patient history, and epidemiological information. The expected result is Negative.  Fact Sheet for Patients: SugarRoll.be  Fact Sheet for Healthcare Providers: https://www.woods-mathews.com/  This test is not yet approved or cleared by the [place] FDA and  has been authorized for detection and/or diagnosis of SARS-CoV-2 by FDA under an Emergency Use Authorization (EUA). This EUA will remain  in effect (meaning this test can be used) for the duration of the COVID-19 declaration under Se ction 564(b)(1) of the Act, 21 U.S.C. section 360bbb-3(b)(1), unless the authorization is terminated or revoked sooner.  Performed at [facility], [place] [address] [place], [place] [address]   MRSA PCR Screening     Status: None   Collection Time: 06/19/20  9:15 PM   Specimen: Nasal Mucosa; Nasopharyngeal  Result Value Ref Range Status   MRSA by PCR NEGATIVE NEGATIVE Final    Comment:        The GeneXpert MRSA Assay (FDA approved for NASAL specimens only), is one component of a comprehensive MRSA colonization surveillance program. It is not intended to diagnose MRSA infection nor to guide or monitor treatment for MRSA infections. Performed at [facility], [place] [address], [place], [place] [address]   SARS CORONAVIRUS 2  (TAT 6-24 HRS) Nasopharyngeal Nasopharyngeal Swab     Status: None   Collection Time: 06/24/20 12:39 PM   Specimen: Nasopharyngeal Swab  Result Value Ref Range Status   SARS Coronavirus 2 NEGATIVE NEGATIVE Final    Comment: (NOTE) SARS-CoV-2 target nucleic acids are NOT DETECTED.  The SARS-CoV-2 RNA is generally detectable in upper and lower respiratory specimens during the acute phase of infection. Negative results do not preclude SARS-CoV-2 infection, do not rule out co-infections with other pathogens, and should not be used as the sole basis for treatment or other patient management decisions. Negative results must be combined with clinical observations, patient history, and epidemiological information. The expected result is Negative.  Fact Sheet for Patients: SugarRoll.be  Fact Sheet for Healthcare Providers: https://www.woods-mathews.com/  This test is not yet approved or cleared by the [place] FDA and  has been authorized for detection and/or diagnosis of SARS-CoV-2 by FDA under an Emergency Use Authorization (EUA). This EUA will remain  in effect (meaning this test can be used) for the duration of the COVID-19 declaration under Se ction 564(b)(1) of the Act, 21 U.S.C. section 360bbb-3(b)(1), unless the authorization is terminated or revoked sooner.  Performed at [facility], [place] [address] [place], [place] [address]  Time spent: 30 min  SIGNED:   Marylu Lund, MD  [facility] 06/25/2020, 9:53 AM  If 7PM-7AM, please contact night-coverage

## 2020-06-25 NOTE — Progress Notes (Signed)
Chaplain engaged in initial visit with Jacqueline [name].  Jacqueline [name] was yelling out, "Help."  Chaplain went in to provide support and offer some comfort.  Jacqueline [name] repeatedly stated that she was ready to go and that she didn't know what to do.  Chaplain offered presence, listening, and affirmation as Jacqueline [name] shared her thoughts.  Chaplain worked to offer Jacqueline [name] some peace through offering her reassurance that medical staff and chaplain are there to help her. Jacqueline [name] chaplain for coming by.    Chaplain is available to continue to offer support.    06/25/20 1200  Clinical Encounter Type  Visited With Patient  Visit Type Initial

## 2020-06-26 LAB — SURGICAL PATHOLOGY

## 2020-06-27 ENCOUNTER — Inpatient Hospital Stay (HOSPITAL_COMMUNITY)
Admission: EM | Admit: 2020-06-27 | Discharge: 2020-06-30 | DRG: 951 | Disposition: E | Payer: Medicare Other | Attending: Internal Medicine | Admitting: Internal Medicine

## 2020-06-27 ENCOUNTER — Emergency Department (HOSPITAL_COMMUNITY): Payer: Medicare Other

## 2020-06-27 DIAGNOSIS — M84452D Pathological fracture, left femur, subsequent encounter for fracture with routine healing: Secondary | ICD-10-CM | POA: Diagnosis present

## 2020-06-27 DIAGNOSIS — I110 Hypertensive heart disease with heart failure: Secondary | ICD-10-CM | POA: Diagnosis present

## 2020-06-27 DIAGNOSIS — E860 Dehydration: Secondary | ICD-10-CM | POA: Diagnosis not present

## 2020-06-27 DIAGNOSIS — I10 Essential (primary) hypertension: Secondary | ICD-10-CM | POA: Diagnosis present

## 2020-06-27 DIAGNOSIS — Z515 Encounter for palliative care: Principal | ICD-10-CM

## 2020-06-27 DIAGNOSIS — J9811 Atelectasis: Secondary | ICD-10-CM | POA: Diagnosis not present

## 2020-06-27 DIAGNOSIS — Z20822 Contact with and (suspected) exposure to covid-19: Secondary | ICD-10-CM | POA: Diagnosis not present

## 2020-06-27 DIAGNOSIS — I959 Hypotension, unspecified: Secondary | ICD-10-CM | POA: Diagnosis present

## 2020-06-27 DIAGNOSIS — Y95 Nosocomial condition: Secondary | ICD-10-CM | POA: Diagnosis present

## 2020-06-27 DIAGNOSIS — F0390 Unspecified dementia without behavioral disturbance: Secondary | ICD-10-CM | POA: Diagnosis present

## 2020-06-27 DIAGNOSIS — Z66 Do not resuscitate: Secondary | ICD-10-CM | POA: Diagnosis not present

## 2020-06-27 DIAGNOSIS — I2510 Atherosclerotic heart disease of native coronary artery without angina pectoris: Secondary | ICD-10-CM | POA: Diagnosis present

## 2020-06-27 DIAGNOSIS — R1310 Dysphagia, unspecified: Secondary | ICD-10-CM | POA: Diagnosis present

## 2020-06-27 DIAGNOSIS — K449 Diaphragmatic hernia without obstruction or gangrene: Secondary | ICD-10-CM | POA: Diagnosis not present

## 2020-06-27 DIAGNOSIS — Z8673 Personal history of transient ischemic attack (TIA), and cerebral infarction without residual deficits: Secondary | ICD-10-CM

## 2020-06-27 DIAGNOSIS — R4182 Altered mental status, unspecified: Secondary | ICD-10-CM | POA: Diagnosis not present

## 2020-06-27 DIAGNOSIS — R6520 Severe sepsis without septic shock: Secondary | ICD-10-CM | POA: Diagnosis not present

## 2020-06-27 DIAGNOSIS — Z823 Family history of stroke: Secondary | ICD-10-CM

## 2020-06-27 DIAGNOSIS — J189 Pneumonia, unspecified organism: Secondary | ICD-10-CM | POA: Diagnosis not present

## 2020-06-27 DIAGNOSIS — K219 Gastro-esophageal reflux disease without esophagitis: Secondary | ICD-10-CM | POA: Diagnosis present

## 2020-06-27 DIAGNOSIS — R0602 Shortness of breath: Secondary | ICD-10-CM | POA: Diagnosis not present

## 2020-06-27 DIAGNOSIS — Z886 Allergy status to analgesic agent status: Secondary | ICD-10-CM

## 2020-06-27 DIAGNOSIS — C7951 Secondary malignant neoplasm of bone: Secondary | ICD-10-CM | POA: Diagnosis present

## 2020-06-27 DIAGNOSIS — R54 Age-related physical debility: Secondary | ICD-10-CM | POA: Diagnosis present

## 2020-06-27 DIAGNOSIS — A419 Sepsis, unspecified organism: Secondary | ICD-10-CM | POA: Diagnosis not present

## 2020-06-27 DIAGNOSIS — I480 Paroxysmal atrial fibrillation: Secondary | ICD-10-CM | POA: Diagnosis present

## 2020-06-27 DIAGNOSIS — C801 Malignant (primary) neoplasm, unspecified: Secondary | ICD-10-CM | POA: Diagnosis present

## 2020-06-27 DIAGNOSIS — Z888 Allergy status to other drugs, medicaments and biological substances status: Secondary | ICD-10-CM

## 2020-06-27 DIAGNOSIS — N179 Acute kidney failure, unspecified: Secondary | ICD-10-CM

## 2020-06-27 DIAGNOSIS — F411 Generalized anxiety disorder: Secondary | ICD-10-CM | POA: Diagnosis present

## 2020-06-27 DIAGNOSIS — I517 Cardiomegaly: Secondary | ICD-10-CM | POA: Diagnosis not present

## 2020-06-27 DIAGNOSIS — R64 Cachexia: Secondary | ICD-10-CM | POA: Diagnosis present

## 2020-06-27 DIAGNOSIS — I5032 Chronic diastolic (congestive) heart failure: Secondary | ICD-10-CM | POA: Diagnosis present

## 2020-06-27 DIAGNOSIS — Z87891 Personal history of nicotine dependence: Secondary | ICD-10-CM

## 2020-06-27 DIAGNOSIS — Z79899 Other long term (current) drug therapy: Secondary | ICD-10-CM

## 2020-06-27 DIAGNOSIS — R188 Other ascites: Secondary | ICD-10-CM | POA: Diagnosis not present

## 2020-06-27 DIAGNOSIS — Z7901 Long term (current) use of anticoagulants: Secondary | ICD-10-CM

## 2020-06-27 DIAGNOSIS — R0902 Hypoxemia: Secondary | ICD-10-CM

## 2020-06-27 DIAGNOSIS — J90 Pleural effusion, not elsewhere classified: Secondary | ICD-10-CM | POA: Diagnosis not present

## 2020-06-27 DIAGNOSIS — F22 Delusional disorders: Secondary | ICD-10-CM | POA: Diagnosis present

## 2020-06-27 DIAGNOSIS — I872 Venous insufficiency (chronic) (peripheral): Secondary | ICD-10-CM | POA: Diagnosis present

## 2020-06-27 DIAGNOSIS — Z880 Allergy status to penicillin: Secondary | ICD-10-CM

## 2020-06-27 MED ORDER — LACTATED RINGERS IV BOLUS (SEPSIS)
1000.0000 mL | Freq: Once | INTRAVENOUS | Status: AC
  Administered 2020-06-28: 1000 mL via INTRAVENOUS

## 2020-06-27 MED ORDER — LACTATED RINGERS IV SOLN: INTRAVENOUS | Status: DC

## 2020-06-27 MED ORDER — VANCOMYCIN HCL 1250 MG/250ML IV SOLN
1250.0000 mg | Freq: Once | INTRAVENOUS | Status: AC
  Administered 2020-06-28: 1250 mg via INTRAVENOUS
  Filled 2020-06-27: qty 250

## 2020-06-27 MED ORDER — SODIUM CHLORIDE 0.9 % IV SOLN
2.0000 g | Freq: Once | INTRAVENOUS | Status: AC
  Administered 2020-06-28: 2 g via INTRAVENOUS
  Filled 2020-06-27: qty 2

## 2020-06-27 MED ORDER — METRONIDAZOLE IN NACL 5-0.79 MG/ML-% IV SOLN
500.0000 mg | Freq: Once | INTRAVENOUS | Status: AC
  Administered 2020-06-28: 500 mg via INTRAVENOUS
  Filled 2020-06-27: qty 100

## 2020-06-27 NOTE — ED Provider Notes (Signed)
[facility] EMERGENCY DEPARTMENT Provider Note   CSN: [id number] Arrival date & time: 06/11/2020  2237     History Chief Complaint  Patient presents with  . Blood Infection    Jacqueline [name] is a 87 y.o. female.  HPI     This is an 87 year old female with a history of atrial fibrillation, CVA, hiatal hernia who presents with altered mental status.  Patient was recently admitted to rehab following femur fracture repair.  She was noted to be less responsive per EMS.  They were called out and she was hypoxic on room air to 80%.  She was placed on nonrebreather.  She was also noted to be hypotensive with blood pressures in the 50s.  They started fluids which did improve this.  Patient is unable to provide any history.  Unclear whether she has been febrile.  Level 5 caveat  Spoke to the patient's son, [name].  He reports he saw his mother 1 to 2 days ago and she was at her baseline.  He reports that her baseline is conversant but as she has been somewhat confused since being in the hospital.  He attributed this to medications.  She is DNR.  Chart review reveals that patient was admitted for a likely pathologic femur fracture.  She was noted to have a lytic bony lesions on multiple scans.  No obvious primary.  She is being worked up for multiple myeloma.  Past Medical History:  Diagnosis Date  . Allergy, unspecified not elsewhere classified   . Anemia, unspecified   . Anxiety state, unspecified   . Atrial fibrillation with rapid ventricular response ([place]) 03/13/12  . Blood in stool   . Complication of anesthesia    "hard to wake me up after colonoscopy" (03/13/12)  . Degeneration of lumbar or lumbosacral intervertebral disc   . GERD (gastroesophageal reflux disease)   . H/O hiatal hernia   . Hemorrhage of gastrointestinal tract, unspecified   . History of IBS   . Osteoarthrosis, unspecified whether generalized or localized, lower leg   . Other diseases of lung, not  elsewhere classified   . Other dyspnea and respiratory abnormality   . Personal history of other diseases of digestive system   . Personal history of peptic ulcer disease   . Unspecified adverse effect of other drug, medicinal and biological substance(995.29)   . Unspecified cerebral artery occlusion with cerebral infarction 10/2008   "mini stroke" denies residual (03/13/12)  . Unspecified essential hypertension   . Unspecified paranoid state     Patient Active Problem List   Diagnosis Date Noted  . Femur fracture ([place]) 06/19/2020  . Hip fracture ([place]) 06/19/2020  . Weakness 12/27/2019  . Generalized weakness 08/05/2019  . Macrocytosis 08/05/2019  . Acute on chronic kidney failure ([place]) 08/05/2019  . Acute respiratory failure with hypoxia ([place]) 12/24/2018  . Acute on chronic diastolic CHF (congestive heart failure) ([place]) 12/24/2018  . Dyspnea 12/24/2018  . Chronic knee pain 12/24/2018  . History of multiple cerebrovascular accidents (CVAs) 12/24/2018  . GERD (gastroesophageal reflux disease) 11/04/2017  . Altered mental status 11/03/2017  . CVA (cerebral vascular accident) ([place]) 10/25/2017  . Benign essential HTN   . History of TIA (transient ischemic attack)   . TIA (transient ischemic attack) 10/24/2017  . Cerebral infarction due to embolism of right middle cerebral artery ([place])   . HLD (hyperlipidemia)   . Acute ischemic right MCA stroke ([place]) 06/18/2014  . Facial droop   . Displaced fracture  of shaft of left humerus 04/03/2014  . Right arm fracture 04/01/2014  . Displaced spiral fracture of shaft of humerus   . Peripheral arterial disease ([place]) 10/03/2013  . Chronic diastolic CHF (congestive heart failure) ([place]) 08/08/2013  . Chronic venous insufficiency 04/02/2013  . UTI (urinary tract infection) 01/13/2013  . Hypokalemia 01/13/2013  . PAF (paroxysmal atrial fibrillation) ([place]) 10/03/2012  . CAD (coronary artery disease) of artery bypass graft 04/05/2012  . Atrial  fibrillation with RVR ([place]) 03/13/2012  . Need for prophylactic vaccination and inoculation against influenza 02/14/2012  . ABDOMINAL WALL HERNIA 01/21/2010  . PARESTHESIA 05/04/2009  . SKIN LESION 01/28/2009  . Nocturia 01/28/2009  . DYSPNEA 01/01/2009  . CVA (cerebral infarction) 11/17/2008  . ALLERGY 10/27/2008  . PAROXYSMAL NOCTURNAL DYSPNEA 10/03/2008  . Osteoarthrosis, unspecified whether generalized or localized, involving lower leg 09/03/2008  . RESTRICTIVE LUNG DISEASE 05/26/2008  . Symptomatic anemia 01/08/2008  . UNS ADVRS EFF OTH RX MEDICINAL&BIOLOGICAL SBSTNC 11/09/2007  . HEMATOCHEZIA 07/18/2007  . PARANOIA 06/01/2007  . Anxiety 01/23/2007  . Hypertensive heart disease with CHF ([place]) 01/23/2007  . GI BLEEDING 01/23/2007  . Osteoarthritis 01/23/2007  . DEGENERATIVE DISC DISEASE, LUMBAR SPINE 01/23/2007  . History of peptic ulcer disease 01/23/2007  . Personal history of other diseases of digestive system 01/23/2007    Past Surgical History:  Procedure Laterality Date  . DILATION AND CURETTAGE OF UTERUS  1950's?  . FEMUR IM NAIL Left 06/20/2020   Procedure: INTRAMEDULLARY (IM) NAIL FEMORAL;  Surgeon: Rod Can, MD;  Location: [facility];  Service: Orthopedics;  Laterality: Left;  . ORIF HUMERUS FRACTURE Right 04/03/2014   Procedure: OPEN REDUCTION INTERNAL FIXATION (ORIF) RIGHT HUMERUS DISTAL FRACTURE;  Surgeon: Roseanne Kaufman, MD;  Location: [facility];  Service: Orthopedics;  Laterality: Right;  . PILONIDAL CYST / SINUS EXCISION  1957  . SHOULDER ARTHROSCOPY  ~ 2000   right     OB History   No obstetric history on file.     Family History  Problem Relation Age of Onset  . Diabetes Mother   . CVA Mother   . CVA Father   . CVA Maternal Grandmother     Social History   Tobacco Use  . Smoking status: Former Smoker    Packs/day: 1.00    Years: 40.00    Pack years: 40.00    Types: Cigarettes    Quit date: 05/02/2002    Years since quitting: 18.1  .  Smokeless tobacco: Never Used  Substance Use Topics  . Alcohol use: No  . Drug use: No    Home Medications Prior to Admission medications   Medication Sig Start Date End Date Taking? Authorizing Provider  acetaminophen (TYLENOL) 500 MG tablet Take 1,000 mg by mouth every 8 (eight) hours as needed for mild pain or fever (for pain).    [provider]  ALPRAZolam Duanne Moron) 0.5 MG tablet Take 1 tablet (0.5 mg total) by mouth 2 (two) times daily as needed for anxiety. 06/25/20   Donne Hazel, MD  apixaban (ELIQUIS) 5 MG TABS tablet Take 1 tablet (5 mg total) by mouth 2 (two) times daily. 06/20/14   Regalado, Belkys A, MD  cholecalciferol (VITAMIN D3) 25 MCG (1000 UNIT) tablet Take 1,000 Units by mouth daily.    [provider]  diclofenac Sodium (VOLTAREN) 1 % GEL Apply 2 g topically 4 (four) times daily. 12/30/19   Raiford Noble Latif, DO  docusate sodium (COLACE) 100 MG capsule Take 200 mg by  mouth daily.    [provider]  furosemide (LASIX) 20 MG tablet Take 1 tablet (20 mg total) by mouth daily. 12/31/19   Raiford Noble Latif, DO  HYDROcodone-acetaminophen (NORCO/VICODIN) 5-325 MG tablet Take 1-2 tablets by mouth every 4 (four) hours as needed for moderate pain (pain score 4-6). 06/22/20   Cherlynn June B, PA  iron polysaccharides (NIFEREX) 150 MG capsule Take 1 capsule (150 mg total) by mouth daily. 12/28/18   Rai, Vernelle Emerald, MD  metoprolol succinate (TOPROL-XL) 50 MG 24 hr tablet Take 50 mg by mouth in the morning.  06/28/19   [provider]  pantoprazole (PROTONIX) 40 MG tablet TAKE 1 TABLET EVERY MORNING Patient taking differently: Take 40 mg by mouth in the morning.    Hendricks Limes, MD  Polyethyl Glycol-Propyl Glycol (SYSTANE) 0.4-0.3 % SOLN Place 2 drops into both eyes every 8 (eight) hours as needed (for dry eyes).    [provider]  polyethylene glycol (MIRALAX / GLYCOLAX) 17 g packet Take 17 g by mouth daily as needed for mild  constipation ([place]).    [provider]    Allergies    Metoclopramide hcl, Penicillins, Aspirin, and Zetia [ezetimibe]  Review of Systems   Review of Systems  Unable to perform ROS: Mental status change    Physical Exam Updated Vital Signs BP 120/65   Pulse (!) 29   Temp 98.3 F (36.8 C) (Rectal)   Resp (!) 32   SpO2 (!) 89%   Physical Exam Vitals and nursing note reviewed.  Constitutional:      Appearance: She is well-developed and well-nourished. She is ill-appearing.     Comments: Somnolent but arousable to voice, ill-appearing  HENT:     Head: Normocephalic and atraumatic.     Nose: Nose normal.     Mouth/Throat:     Mouth: Mucous membranes are dry.  Eyes:     Pupils: Pupils are equal, round, and reactive to light.  Cardiovascular:     Rate and Rhythm: Normal rate and regular rhythm.     Heart sounds: Normal heart sounds.  Pulmonary:     Effort: Respiratory distress present.     Breath sounds: Rhonchi present. No wheezing.     Comments: Slight tachypnea, nonrebreather in place Abdominal:     General: Bowel sounds are normal.     Palpations: Abdomen is soft.     Tenderness: There is no abdominal tenderness. There is no guarding or rebound.  Musculoskeletal:     Cervical back: Neck supple.     Right lower leg: No edema.     Left lower leg: No edema.  Skin:    General: Skin is warm and dry.  Neurological:     Comments: Unable to assess for orientation, appears to move all 4 extremities equally  Psychiatric:        Mood and Affect: Mood and affect normal.     Comments: Unable to assess mood     ED Results / Procedures / Treatments   Labs (all labs ordered are listed, but only abnormal results are displayed) Labs Reviewed  LACTIC ACID, PLASMA - Abnormal; Notable for the following components:      Result Value   Lactic Acid, Venous 3.2 (*)    All other components within normal limits  COMPREHENSIVE METABOLIC PANEL - Abnormal; Notable  for the following components:   CO2 19 (*)    Glucose, Bld 129 (*)    BUN 29 (*)  Creatinine, Ser 1.25 (*)    Calcium 7.9 (*)    Total Protein 4.6 (*)    Albumin 2.2 (*)    AST 79 (*)    Alkaline Phosphatase 312 (*)    Total Bilirubin 1.4 (*)    GFR, Estimated 41 (*)    All other components within normal limits  PROTIME-INR - Abnormal; Notable for the following components:   Prothrombin Time 24.3 (*)    INR 2.3 (*)    All other components within normal limits  URINALYSIS, ROUTINE W REFLEX MICROSCOPIC - Abnormal; Notable for the following components:   Color, Urine AMBER (*)    APPearance HAZY (*)    Specific Gravity, Urine 1.032 (*)    Protein, ur 30 (*)    Leukocytes,Ua TRACE (*)    Bacteria, UA RARE (*)    All other components within normal limits  LACTIC ACID, PLASMA - Abnormal; Notable for the following components:   Lactic Acid, Venous 3.1 (*)    All other components within normal limits  D-DIMER, QUANTITATIVE - Abnormal; Notable for the following components:   D-Dimer, Quant >20.00 (*)    All other components within normal limits  LACTATE DEHYDROGENASE - Abnormal; Notable for the following components:   LDH 736 (*)    All other components within normal limits  CBC WITH DIFFERENTIAL/PLATELET - Abnormal; Notable for the following components:   WBC 14.9 (*)    RBC 2.45 (*)    Hemoglobin 8.2 (*)    HCT 25.6 (*)    MCV 104.5 (*)    RDW 18.6 (*)    Platelets 113 (*)    nRBC 3.1 (*)    Neutro Abs 11.6 (*)    Monocytes Absolute 1.1 (*)    Abs Immature Granulocytes 0.86 (*)    All other components within normal limits  RESP PANEL BY RT-PCR (FLU A&B, COVID) ARPGX2  CULTURE, BLOOD (SINGLE)  URINE CULTURE  CULTURE, BLOOD (ROUTINE X 2)  CULTURE, BLOOD (ROUTINE X 2)  APTT  PROCALCITONIN  LACTIC ACID, PLASMA  CBC WITH DIFFERENTIAL/PLATELET  LACTIC ACID, PLASMA  CBC WITH DIFFERENTIAL/PLATELET  BRAIN NATRIURETIC PEPTIDE  PATHOLOGIST SMEAR REVIEW    EKG EKG  Interpretation  Date/Time:  Saturday June 27 2020 23:35:42 EST Ventricular Rate:  95 PR Interval:    QRS Duration: 102 QT Interval:  369 QTC Calculation: 464 R Axis:   6 Text Interpretation: Sinus rhythm Atrial premature complexes Borderline low voltage, extremity leads Borderline repolarization abnormality Confirmed by Thayer Jew [phone]) on 06/21/2020 11:45:39 PM   Radiology CT Head Wo Contrast  Result Date: 06/28/2020 CLINICAL DATA:  Mental status change EXAM: CT HEAD WITHOUT CONTRAST TECHNIQUE: Contiguous axial images were obtained from the base of the skull through the vertex without intravenous contrast. COMPARISON:  None. FINDINGS: Brain: No evidence of acute territorial infarction, hemorrhage, hydrocephalus,extra-axial collection or mass lesion/mass effect. There is dilatation the ventricles and sulci consistent with age-related atrophy. Low-attenuation changes in the deep white matter consistent with small vessel ischemia. Vascular: No hyperdense vessel or unexpected calcification. Skull: The skull is intact. No fracture or focal lesion identified. Sinuses/Orbits: The visualized paranasal sinuses and mastoid air cells are clear. The orbits and globes intact. Other: None IMPRESSION: No acute intracranial abnormality. Findings consistent with age related atrophy and chronic small vessel ischemia Electronically Signed   By: Prudencio Pair M.D.   On: 06/28/2020 01:10   CT Angio Chest PE W and/or Wo Contrast  Result Date: 06/28/2020 CLINICAL DATA:  Shortness of breath EXAM: CT ANGIOGRAPHY CHEST WITH CONTRAST TECHNIQUE: Multidetector CT imaging of the chest was performed using the standard protocol during bolus administration of intravenous contrast. Multiplanar CT image reconstructions and MIPs were obtained to evaluate the vascular anatomy. CONTRAST:  3m OMNIPAQUE IOHEXOL 350 MG/ML SOLN COMPARISON:  June 20, 2020 for is FINDINGS: Cardiovascular: There is a optimal opacification of  the pulmonary arteries. There is no central,segmental, or subsegmental filling defects within the pulmonary arteries. There is mild cardiomegaly. A trace pericardial effusion is seen. No evidence right heart strain. There is normal three-vessel brachiocephalic anatomy without proximal stenosis. Scattered aortic atherosclerosis is noted. There is focal dilation of the descending intrathoracic aorta measuring up to 4 cm which is unchanged from prior exam. Mediastinum/Nodes: No hilar, mediastinal, or axillary adenopathy. Thyroid gland, trachea, and esophagus demonstrate no significant findings. Lungs/Pleura: Small right and trace left pleural effusion is seen. There is patchy streaky airspace opacity seen at the right lung base and right middle lobe, as on prior examination. There appears to be mild interlobular septal thickening. Upper Abdomen: Vicarious contrast excretion seen within the gallbladder. A small amount of fluid is seen around the gallbladder. A small amount of abdominal ascites is present. Again noted is hiatal herniation. Musculoskeletal: Again noted is unchanged T8 and L2 lytic lesion. There is also a lytic lucency seen in the posterior left third rib. Review of the MIP images confirms the above findings. IMPRESSION: No central, segmental, or subsegmental pulmonary embolism Small bilateral pleural effusions and findings which could be suggestive of interstitial edema Patchy airspace opacities at the right middle lobe and right lung base which could be due to atelectasis or scarring Small pericardial effusion Unchanged lytic lesions within the T8-L2 and posterior left third rib. Aortic Atherosclerosis (ICD10-I70.0). Electronically Signed   By: BPrudencio PairM.D.   On: 06/28/2020 03:30   DG Chest Port 1 View  Result Date: 06/28/2020 CLINICAL DATA:  Sepsis EXAM: PORTABLE CHEST 1 VIEW COMPARISON:  06/19/2020 FINDINGS: Lung volumes are small and there is bibasilar atelectasis. Large hiatal hernia is  better seen on CT examination of 06/20/2020. No pneumothorax or pleural effusion. Mild cardiomegaly is unchanged. Widening of the right paratracheal soft tissues relates to vascular structures, better seen on prior CT examination. Advanced degenerative changes are seen within the shoulders bilaterally. IMPRESSION: No active disease.  Stable cardiomegaly.  Large hiatal hernia. Electronically Signed   By: AFidela SalisburyMD   On: 06/28/2020 01:44    Procedures Procedures   Medications Ordered in ED Medications  lactated ringers infusion ( Intravenous New Bag/Given 06/28/20 0014)  ceFEPIme (MAXIPIME) 2 g in sodium chloride 0.9 % 100 mL IVPB (has no administration in time range)  vancomycin (VANCOREADY) IVPB 1250 mg/250 mL (has no administration in time range)  lactated ringers bolus 1,000 mL (0 mLs Intravenous Stopped 06/28/20 0216)    And  lactated ringers bolus 1,000 mL (0 mLs Intravenous Stopped 06/28/20 0252)  ceFEPIme (MAXIPIME) 2 g in sodium chloride 0.9 % 100 mL IVPB (0 g Intravenous Stopped 06/28/20 0106)  metroNIDAZOLE (FLAGYL) IVPB 500 mg (0 mg Intravenous Stopped 06/28/20 0133)  vancomycin (VANCOREADY) IVPB 1250 mg/250 mL (0 mg Intravenous Stopped 06/28/20 0237)  iohexol (OMNIPAQUE) 350 MG/ML injection 60 mL (60 mLs Intravenous Contrast Given 06/28/20 0315)    ED Course  I have reviewed the triage vital signs and the nursing notes.  Pertinent labs & imaging results that were available during my care of the patient were reviewed by  me and considered in my medical decision making (see chart for details).  Clinical Course as of 06/28/20 0725  Sun Jun 28, 2020  0526 Significant delay in CBC.  Have had the lab add on.  On recheck, patient remains arousable but altered.  I titrated down her oxygen to 10 L by nonrebreather. [CH]    Clinical Course User Index [CH] Audryanna Zurita, Barbette Hair, MD   MDM Rules/Calculators/A&P                          Patient presents with altered mental status,  hypoxia, hypotension.  She is arousable but does not contribute to history taking and is disoriented.  Given her initial vital sign abnormalities, sepsis work-up was initiated.  She is afebrile here but initially was hypotensive.  Not tachycardic.  She was requiring significant oxygen.  She was given broad-spectrum antibiotics of 30 cc/kg fluid.  We will monitor her respiratory status closely as she is DNR.  She does not appear overtly volume overloaded but this is also a consideration although does not explain her hypotension.  Labs notable for lactate of 3.2, D-dimer greater than 20, creatinine of 1.25 which is acutely elevated from baseline.  Alk phos continues to elevate as well which is likely related to known lytic lesions.  Patient had a significant delay in her CBC.  Covid testing is negative.  Chest x-ray shows no acute abnormalities.  CT PE study obtained given patient's significant hypoxia and recent surgery, no PE.  Questionable patchy opacity which could represent atelectasis, scarring, pneumonia.  She was covered with broad-spectrum antibiotics when she was initially evaluated.  On multiple rechecks, her mental status remains the same.  I am attempting to titrate down her oxygen.  She has been fluid responsive.  CBC with a white count of close to 15.  Suspect her hypoxia is multifactorial with possible mild pulmonary edema plus or minus pneumonia.  She has evidence of severe sepsis.  We will plan for admission to the hospital.    Final Clinical Impression(s) / ED Diagnoses Final diagnoses:  Hypoxia  AKI (acute kidney injury) ([place])  Dehydration  Healthcare-associated pneumonia  Severe sepsis [facility])    Rx / DC Orders ED Discharge Orders    None       Merryl Hacker, MD 06/28/20 [phone]

## 2020-06-27 NOTE — ED Notes (Signed)
[phone] pt son would like an update

## 2020-06-27 NOTE — ED Triage Notes (Signed)
Pt arrived from ems due to sepsis. EMS reports pt is from [facility], where she was recovering from a femur fracture repair. Ems reports pt was unresponsive with sats at 80. Pt is more abtundant than usual. EMS also reports pt has been hypotensive with the lowest being 54/30, they have liters on ns which helped bring the bp up. Pt is on 15 l nrb. Ems reports 12 lead is unremarkable. EMS also reports the facility reported right sided facial drooping, however that is not new as seen in the pts face sheet from the rehab,pt has a hx of strokes.

## 2020-06-27 NOTE — ED Notes (Signed)
Unable to do full neuro pt not responding entirely

## 2020-06-28 ENCOUNTER — Emergency Department (HOSPITAL_COMMUNITY): Payer: Medicare Other

## 2020-06-28 DIAGNOSIS — M84452D Pathological fracture, left femur, subsequent encounter for fracture with routine healing: Secondary | ICD-10-CM | POA: Diagnosis present

## 2020-06-28 DIAGNOSIS — C7951 Secondary malignant neoplasm of bone: Secondary | ICD-10-CM | POA: Diagnosis present

## 2020-06-28 DIAGNOSIS — Z515 Encounter for palliative care: Secondary | ICD-10-CM | POA: Diagnosis not present

## 2020-06-28 DIAGNOSIS — R4189 Other symptoms and signs involving cognitive functions and awareness: Secondary | ICD-10-CM | POA: Diagnosis not present

## 2020-06-28 DIAGNOSIS — R0602 Shortness of breath: Secondary | ICD-10-CM | POA: Diagnosis not present

## 2020-06-28 DIAGNOSIS — J189 Pneumonia, unspecified organism: Secondary | ICD-10-CM

## 2020-06-28 DIAGNOSIS — I5032 Chronic diastolic (congestive) heart failure: Secondary | ICD-10-CM | POA: Diagnosis present

## 2020-06-28 DIAGNOSIS — R4182 Altered mental status, unspecified: Secondary | ICD-10-CM | POA: Diagnosis present

## 2020-06-28 DIAGNOSIS — K449 Diaphragmatic hernia without obstruction or gangrene: Secondary | ICD-10-CM | POA: Diagnosis not present

## 2020-06-28 DIAGNOSIS — Z66 Do not resuscitate: Secondary | ICD-10-CM | POA: Diagnosis present

## 2020-06-28 DIAGNOSIS — I517 Cardiomegaly: Secondary | ICD-10-CM | POA: Diagnosis not present

## 2020-06-28 DIAGNOSIS — R5381 Other malaise: Secondary | ICD-10-CM

## 2020-06-28 DIAGNOSIS — I110 Hypertensive heart disease with heart failure: Secondary | ICD-10-CM | POA: Diagnosis present

## 2020-06-28 DIAGNOSIS — R1310 Dysphagia, unspecified: Secondary | ICD-10-CM | POA: Diagnosis present

## 2020-06-28 DIAGNOSIS — J9811 Atelectasis: Secondary | ICD-10-CM | POA: Diagnosis not present

## 2020-06-28 DIAGNOSIS — F0390 Unspecified dementia without behavioral disturbance: Secondary | ICD-10-CM | POA: Diagnosis present

## 2020-06-28 DIAGNOSIS — I872 Venous insufficiency (chronic) (peripheral): Secondary | ICD-10-CM | POA: Diagnosis present

## 2020-06-28 DIAGNOSIS — F22 Delusional disorders: Secondary | ICD-10-CM | POA: Diagnosis present

## 2020-06-28 DIAGNOSIS — Z7189 Other specified counseling: Secondary | ICD-10-CM

## 2020-06-28 DIAGNOSIS — I480 Paroxysmal atrial fibrillation: Secondary | ICD-10-CM | POA: Diagnosis present

## 2020-06-28 DIAGNOSIS — R64 Cachexia: Secondary | ICD-10-CM | POA: Diagnosis present

## 2020-06-28 DIAGNOSIS — Y95 Nosocomial condition: Secondary | ICD-10-CM | POA: Diagnosis present

## 2020-06-28 DIAGNOSIS — E860 Dehydration: Secondary | ICD-10-CM

## 2020-06-28 DIAGNOSIS — Z789 Other specified health status: Secondary | ICD-10-CM

## 2020-06-28 DIAGNOSIS — J90 Pleural effusion, not elsewhere classified: Secondary | ICD-10-CM | POA: Diagnosis not present

## 2020-06-28 DIAGNOSIS — Z20822 Contact with and (suspected) exposure to covid-19: Secondary | ICD-10-CM | POA: Diagnosis present

## 2020-06-28 DIAGNOSIS — R6520 Severe sepsis without septic shock: Secondary | ICD-10-CM | POA: Diagnosis present

## 2020-06-28 DIAGNOSIS — A419 Sepsis, unspecified organism: Secondary | ICD-10-CM | POA: Diagnosis not present

## 2020-06-28 DIAGNOSIS — K219 Gastro-esophageal reflux disease without esophagitis: Secondary | ICD-10-CM | POA: Diagnosis present

## 2020-06-28 DIAGNOSIS — N179 Acute kidney failure, unspecified: Secondary | ICD-10-CM

## 2020-06-28 DIAGNOSIS — I10 Essential (primary) hypertension: Secondary | ICD-10-CM | POA: Diagnosis not present

## 2020-06-28 DIAGNOSIS — R188 Other ascites: Secondary | ICD-10-CM | POA: Diagnosis not present

## 2020-06-28 DIAGNOSIS — I2510 Atherosclerotic heart disease of native coronary artery without angina pectoris: Secondary | ICD-10-CM | POA: Diagnosis present

## 2020-06-28 DIAGNOSIS — I959 Hypotension, unspecified: Secondary | ICD-10-CM | POA: Diagnosis present

## 2020-06-28 DIAGNOSIS — F411 Generalized anxiety disorder: Secondary | ICD-10-CM | POA: Diagnosis present

## 2020-06-28 LAB — CBC WITH DIFFERENTIAL/PLATELET
Abs Immature Granulocytes: 0.86 10*3/uL — ABNORMAL HIGH (ref 0.00–0.07)
Basophils Absolute: 0 10*3/uL (ref 0.0–0.1)
Basophils Relative: 0 %
Eosinophils Absolute: 0 10*3/uL (ref 0.0–0.5)
Eosinophils Relative: 0 %
HCT: 25.6 % — ABNORMAL LOW (ref 36.0–46.0)
Hemoglobin: 8.2 g/dL — ABNORMAL LOW (ref 12.0–15.0)
Immature Granulocytes: 6 %
Lymphocytes Relative: 9 %
Lymphs Abs: 1.4 10*3/uL (ref 0.7–4.0)
MCH: 33.5 pg (ref 26.0–34.0)
MCHC: 32 g/dL (ref 30.0–36.0)
MCV: 104.5 fL — ABNORMAL HIGH (ref 80.0–100.0)
Monocytes Absolute: 1.1 10*3/uL — ABNORMAL HIGH (ref 0.1–1.0)
Monocytes Relative: 7 %
Neutro Abs: 11.6 10*3/uL — ABNORMAL HIGH (ref 1.7–7.7)
Neutrophils Relative %: 78 %
Platelets: 113 10*3/uL — ABNORMAL LOW (ref 150–400)
RBC: 2.45 MIL/uL — ABNORMAL LOW (ref 3.87–5.11)
RDW: 18.6 % — ABNORMAL HIGH (ref 11.5–15.5)
WBC: 14.9 10*3/uL — ABNORMAL HIGH (ref 4.0–10.5)
nRBC: 3.1 % — ABNORMAL HIGH (ref 0.0–0.2)

## 2020-06-28 LAB — COMPREHENSIVE METABOLIC PANEL
ALT: 34 U/L (ref 0–44)
AST: 79 U/L — ABNORMAL HIGH (ref 15–41)
Albumin: 2.2 g/dL — ABNORMAL LOW (ref 3.5–5.0)
Alkaline Phosphatase: 312 U/L — ABNORMAL HIGH (ref 38–126)
Anion gap: 11 (ref 5–15)
BUN: 29 mg/dL — ABNORMAL HIGH (ref 8–23)
CO2: 19 mmol/L — ABNORMAL LOW (ref 22–32)
Calcium: 7.9 mg/dL — ABNORMAL LOW (ref 8.9–10.3)
Chloride: 111 mmol/L (ref 98–111)
Creatinine, Ser: 1.25 mg/dL — ABNORMAL HIGH (ref 0.44–1.00)
GFR, Estimated: 41 mL/min — ABNORMAL LOW (ref >60)
Glucose, Bld: 129 mg/dL — ABNORMAL HIGH (ref 70–99)
Potassium: 4.4 mmol/L (ref 3.5–5.1)
Sodium: 141 mmol/L (ref 135–145)
Total Bilirubin: 1.4 mg/dL — ABNORMAL HIGH (ref 0.3–1.2)
Total Protein: 4.6 g/dL — ABNORMAL LOW (ref 6.5–8.1)

## 2020-06-28 LAB — URINALYSIS, ROUTINE W REFLEX MICROSCOPIC
Bilirubin Urine: NEGATIVE
Glucose, UA: NEGATIVE mg/dL
Hgb urine dipstick: NEGATIVE
Ketones, ur: NEGATIVE mg/dL
Nitrite: NEGATIVE
Protein, ur: 30 mg/dL — AB
Specific Gravity, Urine: 1.032 — ABNORMAL HIGH (ref 1.005–1.030)
pH: 5 (ref 5.0–8.0)

## 2020-06-28 LAB — PROCALCITONIN: Procalcitonin: 0.45 ng/mL

## 2020-06-28 LAB — APTT: aPTT: 30 seconds (ref 24–36)

## 2020-06-28 LAB — BRAIN NATRIURETIC PEPTIDE: B Natriuretic Peptide: 2107.5 pg/mL — ABNORMAL HIGH (ref 0.0–100.0)

## 2020-06-28 LAB — LACTIC ACID, PLASMA
Lactic Acid, Venous: 2.4 mmol/L (ref 0.5–1.9)
Lactic Acid, Venous: 3.1 mmol/L (ref 0.5–1.9)
Lactic Acid, Venous: 3.2 mmol/L (ref 0.5–1.9)

## 2020-06-28 LAB — D-DIMER, QUANTITATIVE: D-Dimer, Quant: >20 ug/mL-FEU — ABNORMAL HIGH (ref 0.00–0.50)

## 2020-06-28 LAB — RESP PANEL BY RT-PCR (FLU A&B, COVID) ARPGX2
Influenza A by PCR: NEGATIVE
Influenza B by PCR: NEGATIVE
SARS Coronavirus 2 by RT PCR: NEGATIVE

## 2020-06-28 LAB — LACTATE DEHYDROGENASE: LDH: 736 U/L — ABNORMAL HIGH (ref 98–192)

## 2020-06-28 LAB — PROTIME-INR
INR: 2.3 — ABNORMAL HIGH (ref 0.8–1.2)
Prothrombin Time: 24.3 seconds — ABNORMAL HIGH (ref 11.4–15.2)

## 2020-06-28 MED ORDER — LACTATED RINGERS IV SOLN: INTRAVENOUS | Status: DC

## 2020-06-28 MED ORDER — HALOPERIDOL LACTATE 5 MG/ML IJ SOLN: 2.0000 mg | Freq: Four times a day (QID) | INTRAMUSCULAR | Status: DC | PRN

## 2020-06-28 MED ORDER — BIOTENE DRY MOUTH MT LIQD
15.0000 mL | Freq: Two times a day (BID) | OROMUCOSAL | Status: DC
  Administered 2020-06-29: 15 mL via TOPICAL

## 2020-06-28 MED ORDER — ACETAMINOPHEN 325 MG PO TABS: 650.0000 mg | ORAL_TABLET | Freq: Four times a day (QID) | ORAL | Status: DC | PRN

## 2020-06-28 MED ORDER — MORPHINE SULFATE (PF) 2 MG/ML IV SOLN
1.0000 mg | INTRAVENOUS | Status: DC | PRN
  Administered 2020-06-28: 1 mg via INTRAVENOUS
  Filled 2020-06-28: qty 1

## 2020-06-28 MED ORDER — HALOPERIDOL LACTATE 2 MG/ML PO CONC
2.0000 mg | Freq: Four times a day (QID) | ORAL | Status: DC | PRN
  Filled 2020-06-28: qty 1

## 2020-06-28 MED ORDER — ACETAMINOPHEN 650 MG RE SUPP: 650.0000 mg | Freq: Four times a day (QID) | RECTAL | Status: DC | PRN

## 2020-06-28 MED ORDER — HALOPERIDOL 0.5 MG PO TABS
0.5000 mg | ORAL_TABLET | ORAL | Status: DC | PRN
  Filled 2020-06-28: qty 1

## 2020-06-28 MED ORDER — SODIUM CHLORIDE 0.9% FLUSH
3.0000 mL | Freq: Two times a day (BID) | INTRAVENOUS | Status: DC
  Administered 2020-06-28 (×2): 3 mL via INTRAVENOUS

## 2020-06-28 MED ORDER — IOHEXOL 350 MG/ML SOLN
60.0000 mL | Freq: Once | INTRAVENOUS | Status: AC | PRN
  Administered 2020-06-28: 60 mL via INTRAVENOUS

## 2020-06-28 MED ORDER — POLYVINYL ALCOHOL 1.4 % OP SOLN
2.0000 [drp] | Freq: Three times a day (TID) | OPHTHALMIC | Status: DC | PRN
  Filled 2020-06-28: qty 15

## 2020-06-28 MED ORDER — HYDROCORTISONE NA SUCCINATE PF 100 MG IJ SOLR: 50.0000 mg | Freq: Four times a day (QID) | INTRAMUSCULAR | Status: DC

## 2020-06-28 MED ORDER — LORAZEPAM 2 MG/ML IJ SOLN: 1.0000 mg | INTRAMUSCULAR | Status: DC | PRN

## 2020-06-28 MED ORDER — HALOPERIDOL LACTATE 2 MG/ML PO CONC
0.5000 mg | ORAL | Status: DC | PRN
  Filled 2020-06-28: qty 0.3

## 2020-06-28 MED ORDER — ONDANSETRON HCL 4 MG PO TABS: 4.0000 mg | ORAL_TABLET | Freq: Four times a day (QID) | ORAL | Status: DC | PRN

## 2020-06-28 MED ORDER — POLYVINYL ALCOHOL 1.4 % OP SOLN
1.0000 [drp] | Freq: Four times a day (QID) | OPHTHALMIC | Status: DC | PRN
  Filled 2020-06-28: qty 15

## 2020-06-28 MED ORDER — GLYCOPYRROLATE 0.2 MG/ML IJ SOLN
0.2000 mg | INTRAMUSCULAR | Status: DC | PRN
  Filled 2020-06-28: qty 1

## 2020-06-28 MED ORDER — BIOTENE DRY MOUTH MT LIQD: 15.0000 mL | OROMUCOSAL | Status: DC | PRN

## 2020-06-28 MED ORDER — SODIUM CHLORIDE 0.9 % IV SOLN: 2.0000 g | INTRAVENOUS | Status: DC

## 2020-06-28 MED ORDER — HYDROMORPHONE HCL 1 MG/ML IJ SOLN
1.0000 mg | INTRAMUSCULAR | Status: DC | PRN
  Administered 2020-06-29: 1 mg via INTRAVENOUS
  Filled 2020-06-28: qty 1

## 2020-06-28 MED ORDER — HALOPERIDOL LACTATE 5 MG/ML IJ SOLN: 0.5000 mg | INTRAMUSCULAR | Status: DC | PRN

## 2020-06-28 MED ORDER — VANCOMYCIN HCL 1250 MG/250ML IV SOLN: 1250.0000 mg | INTRAVENOUS | Status: DC

## 2020-06-28 MED ORDER — GLYCOPYRROLATE 1 MG PO TABS
1.0000 mg | ORAL_TABLET | ORAL | Status: DC | PRN
  Filled 2020-06-28: qty 1

## 2020-06-28 MED ORDER — MORPHINE SULFATE (PF) 2 MG/ML IV SOLN: 2.0000 mg | INTRAVENOUS | Status: DC | PRN

## 2020-06-28 MED ORDER — ENOXAPARIN SODIUM 80 MG/0.8ML ~~LOC~~ SOLN
1.0000 mg/kg | SUBCUTANEOUS | Status: DC
  Filled 2020-06-28: qty 0.65

## 2020-06-28 MED ORDER — HALOPERIDOL 1 MG PO TABS
2.0000 mg | ORAL_TABLET | Freq: Four times a day (QID) | ORAL | Status: DC | PRN
  Filled 2020-06-28: qty 2

## 2020-06-28 MED ORDER — ONDANSETRON HCL 4 MG/2ML IJ SOLN
4.0000 mg | Freq: Four times a day (QID) | INTRAMUSCULAR | Status: DC | PRN
  Administered 2020-06-28: 4 mg via INTRAVENOUS
  Filled 2020-06-28: qty 2

## 2020-06-28 NOTE — Consult Note (Addendum)
Consultation Note Date: 06/28/2020   Patient Name: Jacqueline [name]  DOB: 20-Jan-1929  MRN: [id number]  Age / Sex: 87 y.o., female  PCP: Lajean Manes, MD Referring Physician: Karmen Bongo, MD  Reason for Consultation: Non pain symptom management, Pain control, Psychosocial/spiritual support and Terminal Care  HPI/Patient Profile: 87 y.o. female  with past medical history of HTN, dementia, atrial fibrillation, and anxiety/paranoia presented to the ED from [facility] after experiencing an unresponsive episode. She was worked up for severe sepsis and after discussion with admitting provider family decided to transition patient to full comfort measures. Patient was admitted on 06/14/2020 for EOL care.  Of note, patient was recently admitted 2/18-2/24/22 for femur and hip fracture. It was subsequently found with prior surgical pathology indicating metastatic adenocarcinoma as the cause of her recent femur fracture with lytic lesions previously noted to the spine and ribs.  ED Course:  From SNF rehab.  Declining course, depressed MS.  This AM, hypoxia and hypotension.  Patient is DNR.  Started sepsis work-up, possibly multifactorial delirium.  Given 30cc/kg IVF and broad spectrum antibiotics.  Concern for severe sepsis, possible PNA as source.   Clinical Assessment and Goals of Care: I have reviewed medical records including EPIC notes, labs, and imaging. Received report from primary RN - no acute concerns. RN reports patient is not eating, drinking, or taking oral medications. RN states prior to transition to full comfort, patient repeatedly stated "it hurts."   Went to visit patient at bedside - no family/visitors present. Patient was lying in bed awake, disoriented, and able to participate in minimal conversation - her words are mumbled and hard to understand. I asked if she was having pain, she shakes her head no.  She states, "sleep and rest," for which I responded that our goal was to keep her comfortable now - she smiles and says, "oh thank you." No signs or non-verbal gestures of pain or discomfort noted. No respiratory distress, increased work of breathing, or secretions noted. Patient's mouth is dry - provided mouth swab with water which she seemed to appreciate. She is on 10L O2 [place] - I turn oxygen down to 8L to begin weaning to RA.   I called son/Flake to discuss diagnosis, prognosis, GOC, EOL wishes, disposition, and options.  I introduced Palliative Medicine as specialized medical care for people living with serious illness. It focuses on providing relief from the symptoms and stress of a serious illness. The goal is to improve quality of life for both the patient and the family.  We discussed a brief life review of the patient as well as functional and nutritional status. Ms. 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 They had 2 sons and 2 daughter; unfortunately, one daughter passed away last year. [name] tells me that he has seen a decline in Ms. Levitan - she has lived in several different places over the last year. Therapeutic listening provided as [name] reflects on the  hard year she has had physically and mentally. Most recently, prior to this hospitalization, patient was staying at a rehab facility recovering from her femur fracture repair.   [name] is clear that the family's goal is to keep Ms. Duarte comfortable at this time as previously discussed with Dr. Lorin Mercy - they do not wish to pursue aggressive interventions. We talked about transition to comfort measures in house and what that would entail inclusive of medications to control pain, dyspnea, agitation, nausea, itching, and hiccups. We discussed stopping all unnecessary measures such as blood draws, needle sticks, oxygen, antibiotics, CBGs/insulin, cardiac monitoring, and frequent vital  signs. [name] is agreeable for patient's transition to full comfort today. Introduced and reviewed hospice services and [facility] would like the patient evaluated for residential hospice, requests [facility].   Reviewed current [place] EOL visitation policy - [name] was appreciative and states he and his brother will be by tomorrow to see the patient.   Therapeutic listening and emotional support was provided throughout visit.   Visit also consisted of discussions dealing with the complex and emotionally intense issues of symptom management and palliative care in the setting of serious and life-threatening illness. Palliative care team will continue to support patient, patient's family, and medical team.  Discussed with Flake the importance of continued conversation with family and the medical providers regarding overall plan of care and treatment options, ensuring decisions are within the context of the patient's values and GOCs.    Questions and concerns were addressed. The family was encouraged to call with questions and/or concerns. PMT number was provided.   Primary Decision Maker: NEXT OF KIN - patient's three children - son/Flake is primary contact    SUMMARY OF RECOMMENDATIONS   . Continue full comfort measures . Continue DNR/DNI as previously documented . Transfer to [facility] when bed available - evaluation pending . TOC consult placed for: referral to [facility] per family request . Wean to room air . Added orders for EOL symptom management and to reflect full comfort measures, as well as discontinued orders that were not focused on comfort . Unrestricted visitation orders were placed per current [facility] EOL visitation policy  . Provide frequent assessments and administer PRN medications as clinically necessary to ensure EOL comfort . PMT will continue to follow and support holistically   Code Status/Advance Care Planning:  DNR  Palliative  Prophylaxis:   Aspiration, Bowel Regimen, Delirium Protocol, Frequent Pain Assessment, Oral Care and Turn Reposition  Additional Recommendations (Limitations, Scope, Preferences):  Full Comfort Care  Psycho-social/Spiritual:   Desire for further Chaplaincy support:no Created space and opportunity for patient and family to express thoughts and feelings regarding patient's current medical situation.   Emotional support and therapeutic listening provided.  Prognosis:   < 2 weeks  Discharge Planning: Hospice facility      Primary Diagnoses: Present on Admission: . Severe sepsis with acute organ dysfunction ([place]) . PAF (paroxysmal atrial fibrillation) ([place]) . AKI (acute kidney injury) ([place]) . Metastatic adenocarcinoma to bone [facility])   I have reviewed the medical record, interviewed the patient and family, and examined the patient. The following aspects are pertinent.  Past Medical History:  Diagnosis Date  . Allergy, unspecified not elsewhere classified   . Anemia, unspecified   . Anxiety state, unspecified   . Atrial fibrillation with rapid ventricular response ([place]) 03/13/12  . Blood in stool   . Complication of anesthesia    "hard to wake me up after  colonoscopy" (03/13/12)  . Degeneration of lumbar or lumbosacral intervertebral disc   . GERD (gastroesophageal reflux disease)   . H/O hiatal hernia   . Hemorrhage of gastrointestinal tract, unspecified   . History of IBS   . Osteoarthrosis, unspecified whether generalized or localized, lower leg   . Other diseases of lung, not elsewhere classified   . Other dyspnea and respiratory abnormality   . Personal history of other diseases of digestive system   . Personal history of peptic ulcer disease   . Unspecified adverse effect of other drug, medicinal and biological substance(995.29)   . Unspecified cerebral artery occlusion with cerebral infarction 10/2008   "mini stroke" denies residual (03/13/12)  . Unspecified  essential hypertension   . Unspecified paranoid state    Social History   Socioeconomic History  . Marital status: Widowed    Spouse name: Not on file  . Number of children: 4  . Years of education: 73  . Highest education level: Not on file  Occupational History  . Occupation: retired    Fish farm manager: RETIRED  Tobacco Use  . Smoking status: Former Smoker    Packs/day: 1.00    Years: 40.00    Pack years: 40.00    Types: Cigarettes    Quit date: 05/02/2002    Years since quitting: 18.1  . Smokeless tobacco: Never Used  Substance and Sexual Activity  . Alcohol use: No  . Drug use: No  . Sexual activity: Never  Other Topics Concern  . Not on file  Social History Narrative   HSG. Married: 71- 8 yrs/ divorced; married 1960- 1988/ widowed. 4 grandchildren. 2 daughters- 1950, 1962; 2 sons- 1964, 1965. Lives alone- independent ADLs- pet cockatiel.   Social Determinants of Health   Financial Resource Strain: Not on file  Food Insecurity: Not on file  Transportation Needs: Not on file  Physical Activity: Not on file  Stress: Not on file  Social Connections: Not on file   Family History  Problem Relation Age of Onset  . Diabetes Mother   . CVA Mother   . CVA Father   . CVA Maternal Grandmother    Scheduled Meds: . sodium chloride flush  3 mL Intravenous Q12H   Continuous Infusions: PRN Meds:.acetaminophen **OR** acetaminophen, antiseptic oral rinse, glycopyrrolate **OR** glycopyrrolate **OR** glycopyrrolate, haloperidol **OR** haloperidol **OR** haloperidol lactate, morphine injection, ondansetron **OR** ondansetron (ZOFRAN) IV, polyvinyl alcohol Medications Prior to Admission:  Prior to Admission medications   Medication Sig Start Date End Date Taking? Authorizing Provider  apixaban (ELIQUIS) 5 MG TABS tablet Take 1 tablet (5 mg total) by mouth 2 (two) times daily. 06/20/14  Yes Regalado, Belkys A, MD  cholecalciferol (VITAMIN D3) 25 MCG (1000 UNIT) tablet Take 1,000 Units  by mouth daily.   Yes [provider]  diclofenac Sodium (VOLTAREN) 1 % GEL Apply 2 g topically 4 (four) times daily. 12/30/19  Yes Sheikh, Omair Latif, DO  docusate sodium (COLACE) 100 MG capsule Take 200 mg by mouth daily.   Yes [provider]  furosemide (LASIX) 20 MG tablet Take 1 tablet (20 mg total) by mouth daily. 12/31/19  Yes Sheikh, Omair Latif, DO  iron polysaccharides (NIFEREX) 150 MG capsule Take 1 capsule (150 mg total) by mouth daily. 12/28/18  Yes Rai, Ripudeep K, MD  metoprolol succinate (TOPROL-XL) 50 MG 24 hr tablet Take 50 mg by mouth in the morning.  06/28/19  Yes [provider]  pantoprazole (PROTONIX) 40 MG tablet TAKE 1 TABLET EVERY  MORNING Patient taking differently: Take 40 mg by mouth in the morning.   Yes Hendricks Limes, MD  ALPRAZolam Duanne Moron) 0.5 MG tablet Take 1 tablet (0.5 mg total) by mouth 2 (two) times daily as needed for anxiety. Patient not taking: No sig reported 06/25/20   Donne Hazel, MD  HYDROcodone-acetaminophen (NORCO/VICODIN) 5-325 MG tablet Take 1-2 tablets by mouth every 4 (four) hours as needed for moderate pain (pain score 4-6). Patient not taking: No sig reported 06/22/20   Cherlynn June B, PA   Allergies  Allergen Reactions  . Metoclopramide Hcl Other (See Comments)     Tremors  . Penicillins Itching and Other (See Comments)    "Allergic," per [facility] Has patient had a PCN reaction causing immediate rash, facial/tongue/throat swelling, SOB or lightheadedness with hypotension: Yes Has patient had a PCN reaction causing severe rash involving mucus membranes or skin necrosis: Yes Has patient had a PCN reaction that required hospitalization:No Has patient had a PCN reaction occurring within the last 10 years: No If all of the above answers are "NO", then may proceed with Cephalosporin use.   . Aspirin Other (See Comments)    "gave me stomach aches; made me bleed" (03/13/12)- "Allergic," per MAR  . Zetia [Ezetimibe]  Other (See Comments)    Stomach and back pain   Review of Systems  Unable to perform ROS: Acuity of condition    Physical Exam Vitals and nursing note reviewed.  Constitutional:      General: She is not in acute distress.    Appearance: She is cachectic. She is ill-appearing.  Pulmonary:     Effort: No respiratory distress.  Skin:    General: Skin is warm and dry.  Neurological:     Mental Status: She is lethargic, disoriented and confused.     Motor: Weakness present.  Psychiatric:        Speech: Speech is slurred.        Cognition and Memory: Cognition is impaired. Memory is impaired.     Vital Signs: BP 115/70   Pulse (!) 101   Temp 98.3 F (36.8 C) (Rectal)   Resp 19   SpO2 90%          SpO2: SpO2: 90 % O2 Device:SpO2: 90 % O2 Flow Rate: .O2 Flow Rate (L/min): 3 L/min  IO: Intake/output summary:   Intake/Output Summary (Last 24 hours) at 06/28/2020 1622 Last data filed at 06/28/2020 0840 Gross per 24 hour  Intake 101.48 ml  Output 75 ml  Net 26.48 ml    LBM:   Baseline Weight:   Most recent weight:       Palliative Assessment/Data: PPS 10%     Time In: 1630 Time Out: 1745 Time Total: 75 minutes  Greater than 50%  of this time was spent counseling and coordinating care related to the above assessment and plan.  Signed by: Lin Landsman, NP   Please contact Palliative Medicine Team phone at [phone] for questions and concerns.  For individual provider: See Shea Evans

## 2020-06-28 NOTE — ED Notes (Signed)
Patient transported to CT 

## 2020-06-28 NOTE — H&P (Addendum)
History and Physical    Jacqueline [name] HUD:[id number] DOB: [birth year] DOA: 06/11/2020  PCP: Lajean Manes, MD Consultants:  None Patient coming from: [facility]; NOKEarnie Larsson [place], [phone]; [place], [phone]  Chief Complaint: AMS  HPI: Jacqueline [name] is a 87 y.o. female with medical history significant of HTN; afib on Eliquis; dementia; and anxiety/paranoia presenting with unresponsive episode.  She was previously admitted from 2/18-24 with a fall resulting in L femoral shaft fracture s/p repair.  EMS was called for unresponsiveness and found patient to have O2 sat 80% on RA and BPs in the 50s.  She was placed on NRB and given IVF with some improvement.  She is currently oriented to at least person and awakens to voice/touch.  She is verbal but the speech is not coherent at this time.  Her LLE obviously is uncomfortable with movement.  I spoke with her son, had femur fracture and went to SNF rehab.  She has done "fair" - able to talk normally, weak.  The next day she was sleeping and he planned to see her again.  She previously was at [facility] - having difficulty moving from bed to wheelchair.  Her daughter also had a bone cancer.    ED Course:  From SNF rehab.  Declining course, depressed MS.  This AM, hypoxia and hypotension.  Patient is DNR.  Started sepsis work-up, possibly multifactorial delirium.  Given 30cc/kg IVF and broad spectrum antibiotics.  Concern for severe sepsis, possible PNA as source.  Review of Systems:  Unable to perform  Ambulatory Status:  Currently non-ambulatory due to recent femur fracture and baseline dementia  COVID Vaccine Status:  Unknown  Past Medical History:  Diagnosis Date  . Allergy, unspecified not elsewhere classified   . Anemia, unspecified   . Anxiety state, unspecified   . Atrial fibrillation with rapid ventricular response ([place]) 03/13/12  . Blood in stool   . Complication of anesthesia    "hard to wake me up after  colonoscopy" (03/13/12)  . Degeneration of lumbar or lumbosacral intervertebral disc   . GERD (gastroesophageal reflux disease)   . H/O hiatal hernia   . Hemorrhage of gastrointestinal tract, unspecified   . History of IBS   . Osteoarthrosis, unspecified whether generalized or localized, lower leg   . Other diseases of lung, not elsewhere classified   . Other dyspnea and respiratory abnormality   . Personal history of other diseases of digestive system   . Personal history of peptic ulcer disease   . Unspecified adverse effect of other drug, medicinal and biological substance(995.29)   . Unspecified cerebral artery occlusion with cerebral infarction 10/2008   "mini stroke" denies residual (03/13/12)  . Unspecified essential hypertension   . Unspecified paranoid state     Past Surgical History:  Procedure Laterality Date  . DILATION AND CURETTAGE OF UTERUS  1950's?  . FEMUR IM NAIL Left 06/20/2020   Procedure: INTRAMEDULLARY (IM) NAIL FEMORAL;  Surgeon: Rod Can, MD;  Location: [facility];  Service: Orthopedics;  Laterality: Left;  . ORIF HUMERUS FRACTURE Right 04/03/2014   Procedure: OPEN REDUCTION INTERNAL FIXATION (ORIF) RIGHT HUMERUS DISTAL FRACTURE;  Surgeon: Roseanne Kaufman, MD;  Location: [facility];  Service: Orthopedics;  Laterality: Right;  . PILONIDAL CYST / SINUS EXCISION  1957  . SHOULDER ARTHROSCOPY  ~ 2000   right    Social History   Socioeconomic History  . Marital status: Widowed    Spouse name: Not on file  . Number  of children: 4  . Years of education: 33  . Highest education level: Not on file  Occupational History  . Occupation: retired    Fish farm manager: RETIRED  Tobacco Use  . Smoking status: Former Smoker    Packs/day: 1.00    Years: 40.00    Pack years: 40.00    Types: Cigarettes    Quit date: 05/02/2002    Years since quitting: 18.1  . Smokeless tobacco: Never Used  Substance and Sexual Activity  . Alcohol use: No  . Drug use: No  . Sexual activity:  Never  Other Topics Concern  . Not on file  Social History Narrative   HSG. Married: 54- 8 yrs/ divorced; married 1960- 1988/ widowed. 4 grandchildren. 2 daughters- 1950, 1962; 2 sons- 1964, 1965. Lives alone- independent ADLs- pet cockatiel.   Social Determinants of Health   Financial Resource Strain: Not on file  Food Insecurity: Not on file  Transportation Needs: Not on file  Physical Activity: Not on file  Stress: Not on file  Social Connections: Not on file  Intimate Partner Violence: Not on file    Allergies  Allergen Reactions  . Metoclopramide Hcl Other (See Comments)     Tremors  . Penicillins Itching and Other (See Comments)    "Allergic," per [facility] Has patient had a PCN reaction causing immediate rash, facial/tongue/throat swelling, SOB or lightheadedness with hypotension: Yes Has patient had a PCN reaction causing severe rash involving mucus membranes or skin necrosis: Yes Has patient had a PCN reaction that required hospitalization:No Has patient had a PCN reaction occurring within the last 10 years: No If all of the above answers are "NO", then may proceed with Cephalosporin use.   . Aspirin Other (See Comments)    "gave me stomach aches; made me bleed" (03/13/12)- "Allergic," per MAR  . Zetia [Ezetimibe] Other (See Comments)    Stomach and back pain    Family History  Problem Relation Age of Onset  . Diabetes Mother   . CVA Mother   . CVA Father   . CVA Maternal Grandmother     Prior to Admission medications   Medication Sig Start Date End Date Taking? Authorizing Provider  acetaminophen (TYLENOL) 500 MG tablet Take 1,000 mg by mouth every 8 (eight) hours as needed for mild pain or fever (for pain).    [provider]  ALPRAZolam Duanne Moron) 0.5 MG tablet Take 1 tablet (0.5 mg total) by mouth 2 (two) times daily as needed for anxiety. 06/25/20   Donne Hazel, MD  apixaban (ELIQUIS) 5 MG TABS tablet Take 1 tablet (5 mg total) by mouth 2 (two) times  daily. 06/20/14   Regalado, Belkys A, MD  cholecalciferol (VITAMIN D3) 25 MCG (1000 UNIT) tablet Take 1,000 Units by mouth daily.    [provider]  diclofenac Sodium (VOLTAREN) 1 % GEL Apply 2 g topically 4 (four) times daily. 12/30/19   Raiford Noble Latif, DO  docusate sodium (COLACE) 100 MG capsule Take 200 mg by mouth daily.    [provider]  furosemide (LASIX) 20 MG tablet Take 1 tablet (20 mg total) by mouth daily. 12/31/19   Raiford Noble Latif, DO  HYDROcodone-acetaminophen (NORCO/VICODIN) 5-325 MG tablet Take 1-2 tablets by mouth every 4 (four) hours as needed for moderate pain (pain score 4-6). 06/22/20   Cherlynn June B, PA  iron polysaccharides (NIFEREX) 150 MG capsule Take 1 capsule (150 mg total) by mouth daily. 12/28/18   Mendel Corning, MD  metoprolol succinate (TOPROL-XL) 50 MG 24 hr tablet Take 50 mg by mouth in the morning.  06/28/19   [provider]  pantoprazole (PROTONIX) 40 MG tablet TAKE 1 TABLET EVERY MORNING Patient taking differently: Take 40 mg by mouth in the morning.    Hendricks Limes, MD  Polyethyl Glycol-Propyl Glycol (SYSTANE) 0.4-0.3 % SOLN Place 2 drops into both eyes every 8 (eight) hours as needed (for dry eyes).    [provider]  polyethylene glycol (MIRALAX / GLYCOLAX) 17 g packet Take 17 g by mouth daily as needed for mild constipation ([place]).    [provider]    Physical Exam: Vitals:   06/28/20 0700 06/28/20 0715 06/28/20 0739 06/28/20 0745  BP: 120/65 110/68 130/63 139/79  Pulse: (!) 29 (!) 38 88 (!) 39  Resp: (!) 32 19 (!) 22 (!) 26  Temp:      TempSrc:      SpO2:  91% 91% 91%     . General:  Appears calm and comfortable and is in NAD, on NRB O2; frail and chronically ill-appearing . Eyes:  normal lids, iris, does not fix gaze well . ENT:  Dry lips & tongue, dry mm . Neck:  no LAD, masses or thyromegaly . Cardiovascular:  RRR, no m/r/g. Tr LE edema.  Marland Kitchen Respiratory:   Scattered  rhonchi.  Mildly increased respiratory effort on NRB. Marland Kitchen Abdomen:  soft, NT, ND, +ventral hernia . Skin:  no rash or induration seen on limited exam; acrocyanosis of the R foot with dopplerable DP pulse and palpable PT pulse . Musculoskeletal:  decreased tone BUE/BLE, awkward particularly LLE positioning . Lower extremity:  Tr LE edema.  Limited foot exam with no ulcerations.   Marland Kitchen Psychiatric:  flat mood and affect, speech difficult to understand and clearly Ox1 at least . Neurologic:  Unable to effectively perform    Radiological Exams on Admission: Independently reviewed - see discussion in A/P where applicable  CT Head Wo Contrast  Result Date: 06/28/2020 CLINICAL DATA:  Mental status change EXAM: CT HEAD WITHOUT CONTRAST TECHNIQUE: Contiguous axial images were obtained from the base of the skull through the vertex without intravenous contrast. COMPARISON:  None. FINDINGS: Brain: No evidence of acute territorial infarction, hemorrhage, hydrocephalus,extra-axial collection or mass lesion/mass effect. There is dilatation the ventricles and sulci consistent with age-related atrophy. Low-attenuation changes in the deep white matter consistent with small vessel ischemia. Vascular: No hyperdense vessel or unexpected calcification. Skull: The skull is intact. No fracture or focal lesion identified. Sinuses/Orbits: The visualized paranasal sinuses and mastoid air cells are clear. The orbits and globes intact. Other: None IMPRESSION: No acute intracranial abnormality. Findings consistent with age related atrophy and chronic small vessel ischemia Electronically Signed   By: Prudencio Pair M.D.   On: 06/28/2020 01:10   CT Angio Chest PE W and/or Wo Contrast  Result Date: 06/28/2020 CLINICAL DATA:  Shortness of breath EXAM: CT ANGIOGRAPHY CHEST WITH CONTRAST TECHNIQUE: Multidetector CT imaging of the chest was performed using the standard protocol during bolus administration of intravenous contrast.  Multiplanar CT image reconstructions and MIPs were obtained to evaluate the vascular anatomy. CONTRAST:  8mL OMNIPAQUE IOHEXOL 350 MG/ML SOLN COMPARISON:  June 20, 2020 for is FINDINGS: Cardiovascular: There is a optimal opacification of the pulmonary arteries. There is no central,segmental, or subsegmental filling defects within the pulmonary arteries. There is mild cardiomegaly. A trace pericardial effusion is seen. No evidence right heart strain. There is normal three-vessel  brachiocephalic anatomy without proximal stenosis. Scattered aortic atherosclerosis is noted. There is focal dilation of the descending intrathoracic aorta measuring up to 4 cm which is unchanged from prior exam. Mediastinum/Nodes: No hilar, mediastinal, or axillary adenopathy. Thyroid gland, trachea, and esophagus demonstrate no significant findings. Lungs/Pleura: Small right and trace left pleural effusion is seen. There is patchy streaky airspace opacity seen at the right lung base and right middle lobe, as on prior examination. There appears to be mild interlobular septal thickening. Upper Abdomen: Vicarious contrast excretion seen within the gallbladder. A small amount of fluid is seen around the gallbladder. A small amount of abdominal ascites is present. Again noted is hiatal herniation. Musculoskeletal: Again noted is unchanged T8 and L2 lytic lesion. There is also a lytic lucency seen in the posterior left third rib. Review of the MIP images confirms the above findings. IMPRESSION: No central, segmental, or subsegmental pulmonary embolism Small bilateral pleural effusions and findings which could be suggestive of interstitial edema Patchy airspace opacities at the right middle lobe and right lung base which could be due to atelectasis or scarring Small pericardial effusion Unchanged lytic lesions within the T8-L2 and posterior left third rib. Aortic Atherosclerosis (ICD10-I70.0). Electronically Signed   By: Prudencio Pair M.D.    On: 06/28/2020 03:30   DG Chest Port 1 View  Result Date: 06/28/2020 CLINICAL DATA:  Sepsis EXAM: PORTABLE CHEST 1 VIEW COMPARISON:  06/19/2020 FINDINGS: Lung volumes are small and there is bibasilar atelectasis. Large hiatal hernia is better seen on CT examination of 06/20/2020. No pneumothorax or pleural effusion. Mild cardiomegaly is unchanged. Widening of the right paratracheal soft tissues relates to vascular structures, better seen on prior CT examination. Advanced degenerative changes are seen within the shoulders bilaterally. IMPRESSION: No active disease.  Stable cardiomegaly.  Large hiatal hernia. Electronically Signed   By: Fidela Salisbury MD   On: 06/28/2020 01:44    EKG: Independently reviewed.  NSR with rate 89; low voltage, nonspecific ST changes with no evidence of acute ischemia   Labs on Admission: I have personally reviewed the available labs and imaging studies at the time of the admission.  Pertinent labs:   CO2 19 Glucose 129 BUN 29/Creatinine 1.25/GFR 41; 28/0.55/>60 on 2/24 AP 312 Albumin 2.2 AST 79/ALT 34 Bili 1.4 LDH 736 BNP 2107.5 Lactate 3.2, 3.1 Procalcitonin 0.45 WBC 14.9 Hgb 8.2 - stable Platelets 113 UA: trace LE, 30 protein, rare bacteria Blood and urine cultures are pending COVID/flu negative   Assessment/Plan Principal Problem:   Admission for end of life care Active Problems:   PAF (paroxysmal atrial fibrillation) (HCC)   AKI (acute kidney injury) ([place])   Severe sepsis with acute organ dysfunction ([place])   Metastatic adenocarcinoma to bone (HCC)   Severe sepsis, ?possible PNA -SIRS criteria in this patient includes: Leukocytosis, tachycardia, tachypnea, leukocytosis  -Patient has evidence of acute organ failure with elevated lactate >2; encephalopathy; recurrent hypotension (SBP < 90 or MAP < 65 x 2 readings) that is not easily explained by another condition. -While awaiting blood cultures, this appears to be a preseptic  condition. -Sepsis protocol initiated -Patient had initial SBP <90/MAP <65 and so has received the 30 cc/kg IVF bolus. -Suspected source is PNA, possible aspiration. -Blood and urine cultures pending -Sepsis order set was utilized but after discussion with family the decision was made to transition to end of life care (see below); as such, sepsis order set was discontinued.  AKI -Likely associated with sepsis and underlying malignancy -IVF  discontinued after discussion with family (see below)  Recent L femoral shaft fracture, pathologic from metastatic adenocarcinoma with probable lung primary -Concern for pathologic fracture based on aggressive bone lesions noted on imaging -Elevated AP with normal GGT - significantly more elevated today than prior -S/p intramedullary fixation of left humeral shaft fracture -Oncology, Dr. Irene Limbo recs: myeloma panel, light chains,both reviewed and are unremarkable.  -Surgical pathology c/w metastatic adenocarcinoma with probable lung primary -Patient had been discharged to SNF rehab  Afib, Dementia, Anxiety -Chronic medical problems, will no longer be treated based on transition to comfort care  End of life care -Patient presenting with apparent sepsis, possibly related to PNA -Also with prior surgical pathology indicating metastatic adenocarcinoma as the cause of her recent femur fracture with lytic lesions previously noted to the spine and ribs -After discussion over the telephone with her son, family has decided to proceed with comfort care only -She will be admitted to [facility] for comfort care and palliative care consult -Patient is likely to suffer an in-hospital demise but may survive long enough for transport to residential hospice if desired by family -Comfort care order set utilized -No antibiotics or IVF as per family's request -Pain control with morphine as needed, would transition to a drip if needed    Note: This patient has been tested and  is negative for the novel coronavirus COVID-19.   DVT prophylaxis: None - comfort measures Code Status: DNR - confirmed with family Family Communication: I spoke with her son by telephone at the time of admission Disposition Plan: Anticipate in-hospital death Consults called: Palliative care Admission status: Admit - It is my clinical opinion that admission to INPATIENT is reasonable and necessary because of the expectation that this patient will require hospital care that crosses at least 2 midnights to treat this condition based on the medical complexity of the problems presented.  Given the aforementioned information, the predictability of an adverse outcome is felt to be significant.     Karmen Bongo MD [facility]   How to contact the [facility] Attending or Consulting provider [place] or covering provider during after hours [place], for this patient?  1. Check the care team in [facility] and look for a) attending/consulting [facility] provider listed and b) the [facility] team listed 2. Log into [email or web address] and use [place]'s universal password to access. If you do not have the password, please contact the hospital operator. 3. Locate the [facility] provider you are looking for under [facility] and page to a number that you can be directly reached. 4. If you still have difficulty reaching the provider, please page the [facility] (Director on Call) for the Hospitalists listed on amion for assistance.   06/28/2020, 8:49 AM

## 2020-06-28 NOTE — ED Notes (Signed)
Patient is resting comfortably. 

## 2020-06-28 NOTE — Progress Notes (Signed)
Elink following for Sepsis Protocol 

## 2020-06-28 NOTE — Progress Notes (Signed)
Secure chatted with bedside RN to ask MD about repeating Lactic

## 2020-06-28 NOTE — Progress Notes (Signed)
Received pt from ED; talking but hard to understand;on 3L nasal cannula with 94% sat; Pt is DNR;Comfort Measures per ED Nurse report;initial V/S were T-97.9; BP-103/59;HR- 108;RR-18.

## 2020-06-28 NOTE — Progress Notes (Signed)
Pharmacy Antibiotic Note  Jacqueline [name] is a 87 y.o. female admitted on 06/09/2020 with sepsis.  Pharmacy has been consulted for Vancomycin/Cefepime dosing. WBC is pending. Scr has doubled from baseline (0.55 two days ago>>>1.25).   Plan: Vancomycin 1250 mg IV q48h >>Estimated AUC: 440 Cefepime 2g IV q24h Trend WBC, temp, renal function  F/U infectious work-up Drug levels as indicated  Temp (24hrs), Avg:98.1 F (36.7 C), Min:97.8 F (36.6 C), Max:98.3 F (36.8 C)  Recent Labs  Lab 06/21/20 0433 06/21/20 1532 06/21/20 1920 06/22/20 0724 06/23/20 0513 06/24/20 0510 06/25/20 0433 06/26/2020 2329 06/02/2020 2357  WBC 8.5  --   --  10.1 8.8  --  11.3*  --   --   CREATININE 0.73  --   --  1.20* 0.92 0.72 0.55 1.25*  --   LATICACIDVEN  --  1.6 1.8  --   --   --   --   --  3.2*    Estimated Creatinine Clearance: 26.3 mL/min (A) (by C-G formula based on SCr of 1.25 mg/dL (H)).    Allergies  Allergen Reactions  . Metoclopramide Hcl Other (See Comments)     Tremors  . Penicillins Itching and Other (See Comments)    "Allergic," per [facility] Has patient had a PCN reaction causing immediate rash, facial/tongue/throat swelling, SOB or lightheadedness with hypotension: Yes Has patient had a PCN reaction causing severe rash involving mucus membranes or skin necrosis: Yes Has patient had a PCN reaction that required hospitalization:No Has patient had a PCN reaction occurring within the last 10 years: No If all of the above answers are "NO", then may proceed with Cephalosporin use.   . Aspirin Other (See Comments)    "gave me stomach aches; made me bleed" (03/13/12)- "Allergic," per MAR  . Zetia [Ezetimibe] Other (See Comments)    Stomach and back pain   Narda Bonds, PharmD, [place] Clinical Pharmacist Phone: [phone]

## 2020-06-28 NOTE — ED Notes (Signed)
Md notified of pts lactic 3.2

## 2020-06-28 NOTE — ED Notes (Signed)
Pt resting quietly at this time. NAD present. No needs identified.

## 2020-06-28 NOTE — ED Notes (Signed)
Patient resting quietly. Incomprehensible words spoken when pt is touched or spoken to. NAD present.

## 2020-06-28 NOTE — ED Notes (Signed)
Report obtained; care assumed at this time. Pt resting quietly. No needs identified. NAD present.

## 2020-06-29 ENCOUNTER — Other Ambulatory Visit: Payer: Self-pay

## 2020-06-29 LAB — URINE CULTURE: Culture: >=100000 — AB

## 2020-06-29 LAB — PATHOLOGIST SMEAR REVIEW

## 2020-06-30 NOTE — Plan of Care (Signed)

## 2020-06-30 NOTE — Death Summary Note (Signed)
DEATH SUMMARY   Patient Details  Name: Jacqueline [name] MRN: [id number] DOB: 1928-12-26  Admission/Discharge Information   Admit Date:  07-07-20  Date of Death: Date of Death: 07/09/20  Time of Death: Time of Death: 0850  Length of Stay: 1  Referring Physician: Lajean Manes, MD   Reason(s) for Hospitalization  Unresponsiveness  Diagnoses  Preliminary cause of death:  Secondary Diagnoses (including complications and co-morbidities):  Principal Problem:   Admission for end of life care Active Problems:   PAF (paroxysmal atrial fibrillation) ([place])   AKI (acute kidney injury) ([place])   Severe sepsis with acute organ dysfunction ([place])   Metastatic adenocarcinoma to bone [facility])  [facility] Course (including significant findings, care, treatment, and services provided and events leading to death)  Jacqueline [name] is a 87 y.o. year old female with PMH significant for HTN, afib on Eliquis, dementia and anxiety/paranoia and a recent femur fracture. Patient was sent from [facility] for unresponsiveness. She was worked up for severe sepsis and after discussing with the family, will comfort care measures were initiated. Palliative care consult was called.  Comfort care measures were initiated. Patient expired at 8:50 am on 2020-07-09.  Cause of death:  Sepsis secondary to pneumonia Aspiration Dysphagia Dementia   Pertinent Labs and Studies  Significant Diagnostic Studies CT ABDOMEN PELVIS WO CONTRAST  Result Date: 06/20/2020 CLINICAL DATA:  Cancer of unknown primary. Pathologic femur fracture. EXAM: CT CHEST, ABDOMEN AND PELVIS WITHOUT CONTRAST TECHNIQUE: Multidetector CT imaging of the chest, abdomen and pelvis was performed following the standard protocol without IV contrast. COMPARISON:  Femur CT from yesterday. FINDINGS: CT CHEST FINDINGS Cardiovascular: Normal heart size. Heavily calcified aorta and great vessels. Coronary atherosclerosis. No acute vascular finding without  contrast. Dilated upper descending aorta measuring up to 3.9 cm in diameter. Mediastinum/Nodes: Large hiatal hernia containing stomach and pancreas. Lungs/Pleura: Bandlike opacities most consistent with atelectasis. No definite pulmonary mass or airway cut off. There is no edema, consolidation, effusion, or pneumothorax. Musculoskeletal: T8 lytic lesion within the body with superior endplate fracture. Posterior left third rib fracture with underlying lytic lesion and permeative appearance. Advanced spinal degeneration. Severe bilateral glenohumeral osteoarthritis. CT ABDOMEN PELVIS FINDINGS Hepatobiliary: No focal liver abnormality.Full gallbladder without calcified stone or acute inflammation. Distended common bile duct without internal filling defect seen. Pancreas: Generalized atrophy. Partial herniation into the chest via a hiatal hernia. Spleen: Unremarkable. Adrenals/Urinary Tract: Negative adrenals. No hydronephrosis or stone. Unremarkable bladder. Stomach/Bowel: Severe distal colonic diverticulosis. No obstruction or visible mass. No visible bowel inflammation. Vascular/Lymphatic: No acute vascular abnormality. Extensive atheromatous calcification of the aorta. No mass or adenopathy. Reproductive:No pathologic findings. Other: Lax abdominal wall with probable very broad spigelian hernia on the right containing multiple small bowel loops. Musculoskeletal: Aggressive/permeative lesion in the upper right ilium. Known left femur fracture which is partially covered on this study. There is likely a mixed sclerotic and lucent lesion in the left ilium. Lytic lesion in the L2 vertebral body without acute fracture. Given osteopenia, there could be even more extensive and obscured spinal disease. Advanced lumbar spine degeneration with scoliosis. IMPRESSION: 1. Additional aggressive bone lesions compatible with metastatic disease. No primary malignancy is seen within the chest and abdominal viscera. 2. Incidental  findings are described above. Electronically Signed   By: Monte Fantasia M.D.   On: 06/20/2020 08:04   DG Chest 1 View  Result Date: 06/19/2020 CLINICAL DATA:  Fall EXAM: CHEST  1 VIEW COMPARISON:  December 29, 2019 FINDINGS: Evaluation is  limited secondary to patient rotation. Revisualization of cardiomegaly. Triangular opacity along the RIGHT perihilar border is similar in comparison to prior when accounting for differences in patient rotation. Elevation of the RIGHT hemidiaphragm, unchanged. Atherosclerotic calcifications of the aorta. Large hiatal hernia. Reticulonodular opacities of the RIGHT lung periphery. Hazy opacity overlying the LEFT upper lung, likely due to skin fold given persistence of lung markings distal to this opacity. No definitive pneumothorax or pleural effusion. Lucency traversing the proximal RIGHT humerus. Advanced degenerative changes of the RIGHT humerus. Status post ORIF of the distal RIGHT humerus. Degenerative changes of the thoracic spine. IMPRESSION: 1. Lucency traversing the proximal RIGHT humerus may reflect a nondisplaced fracture. Consider additional dedicated radiographs for further evaluation. 2. Similar appearance of enlarged cardiomediastinal silhouette although evaluation is limited secondary to patient rotation. Mildly increased reticulonodular opacities of the RIGHT lung periphery likely reflecting aspiration or atelectasis. Superimposed infection could present similarly. Electronically Signed   By: Valentino Saxon MD   On: 06/19/2020 12:37   CT Head Wo Contrast  Result Date: 06/28/2020 CLINICAL DATA:  Mental status change EXAM: CT HEAD WITHOUT CONTRAST TECHNIQUE: Contiguous axial images were obtained from the base of the skull through the vertex without intravenous contrast. COMPARISON:  None. FINDINGS: Brain: No evidence of acute territorial infarction, hemorrhage, hydrocephalus,extra-axial collection or mass lesion/mass effect. There is dilatation the  ventricles and sulci consistent with age-related atrophy. Low-attenuation changes in the deep white matter consistent with small vessel ischemia. Vascular: No hyperdense vessel or unexpected calcification. Skull: The skull is intact. No fracture or focal lesion identified. Sinuses/Orbits: The visualized paranasal sinuses and mastoid air cells are clear. The orbits and globes intact. Other: None IMPRESSION: No acute intracranial abnormality. Findings consistent with age related atrophy and chronic small vessel ischemia Electronically Signed   By: Prudencio Pair M.D.   On: 06/28/2020 01:10   CT CHEST WO CONTRAST  Result Date: 06/20/2020 CLINICAL DATA:  Cancer of unknown primary. Pathologic femur fracture. EXAM: CT CHEST, ABDOMEN AND PELVIS WITHOUT CONTRAST TECHNIQUE: Multidetector CT imaging of the chest, abdomen and pelvis was performed following the standard protocol without IV contrast. COMPARISON:  Femur CT from yesterday. FINDINGS: CT CHEST FINDINGS Cardiovascular: Normal heart size. Heavily calcified aorta and great vessels. Coronary atherosclerosis. No acute vascular finding without contrast. Dilated upper descending aorta measuring up to 3.9 cm in diameter. Mediastinum/Nodes: Large hiatal hernia containing stomach and pancreas. Lungs/Pleura: Bandlike opacities most consistent with atelectasis. No definite pulmonary mass or airway cut off. There is no edema, consolidation, effusion, or pneumothorax. Musculoskeletal: T8 lytic lesion within the body with superior endplate fracture. Posterior left third rib fracture with underlying lytic lesion and permeative appearance. Advanced spinal degeneration. Severe bilateral glenohumeral osteoarthritis. CT ABDOMEN PELVIS FINDINGS Hepatobiliary: No focal liver abnormality.Full gallbladder without calcified stone or acute inflammation. Distended common bile duct without internal filling defect seen. Pancreas: Generalized atrophy. Partial herniation into the chest via a  hiatal hernia. Spleen: Unremarkable. Adrenals/Urinary Tract: Negative adrenals. No hydronephrosis or stone. Unremarkable bladder. Stomach/Bowel: Severe distal colonic diverticulosis. No obstruction or visible mass. No visible bowel inflammation. Vascular/Lymphatic: No acute vascular abnormality. Extensive atheromatous calcification of the aorta. No mass or adenopathy. Reproductive:No pathologic findings. Other: Lax abdominal wall with probable very broad spigelian hernia on the right containing multiple small bowel loops. Musculoskeletal: Aggressive/permeative lesion in the upper right ilium. Known left femur fracture which is partially covered on this study. There is likely a mixed sclerotic and lucent lesion in the left ilium. Lytic lesion in  the L2 vertebral body without acute fracture. Given osteopenia, there could be even more extensive and obscured spinal disease. Advanced lumbar spine degeneration with scoliosis. IMPRESSION: 1. Additional aggressive bone lesions compatible with metastatic disease. No primary malignancy is seen within the chest and abdominal viscera. 2. Incidental findings are described above. Electronically Signed   By: Monte Fantasia M.D.   On: 06/20/2020 08:04   CT Angio Chest PE W and/or Wo Contrast  Result Date: 06/28/2020 CLINICAL DATA:  Shortness of breath EXAM: CT ANGIOGRAPHY CHEST WITH CONTRAST TECHNIQUE: Multidetector CT imaging of the chest was performed using the standard protocol during bolus administration of intravenous contrast. Multiplanar CT image reconstructions and MIPs were obtained to evaluate the vascular anatomy. CONTRAST:  14mL OMNIPAQUE IOHEXOL 350 MG/ML SOLN COMPARISON:  June 20, 2020 for is FINDINGS: Cardiovascular: There is a optimal opacification of the pulmonary arteries. There is no central,segmental, or subsegmental filling defects within the pulmonary arteries. There is mild cardiomegaly. A trace pericardial effusion is seen. No evidence right heart  strain. There is normal three-vessel brachiocephalic anatomy without proximal stenosis. Scattered aortic atherosclerosis is noted. There is focal dilation of the descending intrathoracic aorta measuring up to 4 cm which is unchanged from prior exam. Mediastinum/Nodes: No hilar, mediastinal, or axillary adenopathy. Thyroid gland, trachea, and esophagus demonstrate no significant findings. Lungs/Pleura: Small right and trace left pleural effusion is seen. There is patchy streaky airspace opacity seen at the right lung base and right middle lobe, as on prior examination. There appears to be mild interlobular septal thickening. Upper Abdomen: Vicarious contrast excretion seen within the gallbladder. A small amount of fluid is seen around the gallbladder. A small amount of abdominal ascites is present. Again noted is hiatal herniation. Musculoskeletal: Again noted is unchanged T8 and L2 lytic lesion. There is also a lytic lucency seen in the posterior left third rib. Review of the MIP images confirms the above findings. IMPRESSION: No central, segmental, or subsegmental pulmonary embolism Small bilateral pleural effusions and findings which could be suggestive of interstitial edema Patchy airspace opacities at the right middle lobe and right lung base which could be due to atelectasis or scarring Small pericardial effusion Unchanged lytic lesions within the T8-L2 and posterior left third rib. Aortic Atherosclerosis (ICD10-I70.0). Electronically Signed   By: Prudencio Pair M.D.   On: 06/28/2020 03:30   CT FEMUR LEFT WO CONTRAST  Result Date: 06/19/2020 CLINICAL DATA:  Fall EXAM: CT OF THE LOWER LEFT EXTREMITY WITHOUT CONTRAST TECHNIQUE: Multidetector CT imaging of the lower left extremity was performed according to the standard protocol. COMPARISON:  Radiographs 06/19/2020 FINDINGS: Bones/Joint/Cartilage Redemonstration of the internally rotated, posterolaterally displaced, foreshortened and varus angulated mid femoral  diaphyseal fracture. While the bones appear diffusely demineralized, there is a more focally permeative appearance of the cortex and intermediate medullary attenuation about the fracture line. While some of this may be attributable to intramedullary hemorrhage and demineralization, the overall appearance is highly conspicuous for an underlying pathologic fracture. No additional acute fracture is seen. Included bones of the pelvis appear intact and congruent. Proximal femoral heads are normally located. Severe left and moderate right hip osteoarthrosis. Moderate tricompartmental degenerative changes noted in the right knee. Additional degenerative changes noted incidentally included left hand and wrist. Ligaments Suboptimally assessed by CT. Muscles and Tendons Intramuscular thickening and hemorrhage with stranding about the fracture line. Ligamentous and muscular laxity related to foreshortening and displacement across the fracture line. No fully retracted or torn tendons are identified. Soft tissues Markedly  suboptimal assessment of the vasculature in the absence of contrast media. Extensive atherosclerotic calcification helps to delineate the course of the superficial femoral and popliteal arteries as well as the proximal runoff vessels without clear surrounding hemorrhage or hematoma. Stranding, thickening and swelling adjacent the fracture line. Included portions of the abdomen and pelvis demonstrate significant abdominal wall laxity and some laxity of the pelvic floor as well. Extensive colonic diverticulosis. Some questionable stranding noted adjacent the proximal sigmoid, nonspecific. Vascular calcium in the pelvis. IMPRESSION: 1. Redemonstration of the internally rotated, posterolaterally displaced, foreshortened and varus angulated mid femoral diaphyseal fracture. Focally permeative appearance of the cortex and intermediate medullary attenuation about the fracture line; while some of this may be  attributable to intramedullary hemorrhage and demineralization, the overall appearance is highly conspicuous for an underlying pathologic fracture. 2. No additional acute fracture is seen. 3. Markedly suboptimal assessment of the vasculature in the absence of contrast media. Extensive atherosclerotic calcification helps to delineate the course of the superficial femoral and popliteal arteries as well as the proximal runoff vessels without clear surrounding hemorrhage or hematoma. Correlate with pulse assessment and clinical exam findings. 4. Severe left and moderate right hip osteoarthrosis. 5. Extensive colonic diverticulosis with questionable stranding adjacent the proximal sigmoid, nonspecific. Correlate for symptoms of acute diverticulitis. 6. Pelvic floor and abdominal wall laxity. Electronically Signed   By: Lovena Le M.D.   On: 06/19/2020 23:07   DG Chest Port 1 View  Result Date: 06/28/2020 CLINICAL DATA:  Sepsis EXAM: PORTABLE CHEST 1 VIEW COMPARISON:  06/19/2020 FINDINGS: Lung volumes are small and there is bibasilar atelectasis. Large hiatal hernia is better seen on CT examination of 06/20/2020. No pneumothorax or pleural effusion. Mild cardiomegaly is unchanged. Widening of the right paratracheal soft tissues relates to vascular structures, better seen on prior CT examination. Advanced degenerative changes are seen within the shoulders bilaterally. IMPRESSION: No active disease.  Stable cardiomegaly.  Large hiatal hernia. Electronically Signed   By: Fidela Salisbury MD   On: 06/28/2020 01:44   DG Humerus Right  Result Date: 06/19/2020 CLINICAL DATA:  Fall today. EXAM: RIGHT HUMERUS - 2+ VIEW COMPARISON:  None. FINDINGS: Status post surgical internal fixation of old distal right humeral fracture. Severe degenerative changes seen involving the right glenohumeral joint. No acute fracture or dislocation is noted. No soft tissue abnormality is noted. IMPRESSION: Status post surgical internal fixation  of old distal right humeral fracture. Severe degenerative joint disease of the right glenohumeral joint. No acute fracture or dislocation is noted. Electronically Signed   By: Marijo Conception M.D.   On: 06/19/2020 13:12   DG C-Arm 1-60 Min-No Report  Result Date: 06/20/2020 Fluoroscopy was utilized by the requesting physician.  No radiographic interpretation.   ECHOCARDIOGRAM COMPLETE  Result Date: 06/22/2020    ECHOCARDIOGRAM REPORT   Patient Name:   Jacqueline [name] Date of Exam: 06/22/2020 Medical Rec #:  [id number]  Height:       61.0 in Accession #:    [id number] Weight:       143.5 lb Date of Birth:  March 14, 1929   BSA:          1.640 m Patient Age:    80 years   BP:           126/73 mmHg Patient Gender: F          HR:           108 bpm. Exam Location:  Inpatient Procedure: 2D Echo Indications:  Hypotension  History:        Patient has prior history of Echocardiogram examinations, most                 recent 12/25/2018. Arrythmias:Atrial Fibrillation;                 Signs/Symptoms:Altered Mental Status.  Referring Phys: [id number] Charlesetta Ivory GONFA  Sonographer Comments: Technically challenging study due to limited acoustic windows. Image acquisition challenging due to uncooperative patient. IMPRESSIONS  1. LV function is vigorous with near cavity obliteration during systole. Left ventricular ejection fraction, by estimation, is >75%. The left ventricle has hyperdynamic function. The left ventricle has no regional wall motion abnormalities. Left ventricular diastolic parameters are consistent with Grade I diastolic dysfunction (impaired relaxation).  2. Right ventricular systolic function is normal. The right ventricular size is normal. There is severely elevated pulmonary artery systolic pressure.  3. Left atrial size was severely dilated.  4. The mitral valve is abnormal. Trivial mitral valve regurgitation.  5. The aortic valve was not well visualized. Aortic valve regurgitation is not visualized. No aortic stenosis  is present. FINDINGS  Left Ventricle: LV function is vigorous with near cavity obliteration during systole. Left ventricular ejection fraction, by estimation, is >75%. The left ventricle has hyperdynamic function. The left ventricle has no regional wall motion abnormalities.  The left ventricular internal cavity size was small. There is no left ventricular hypertrophy. Left ventricular diastolic parameters are consistent with Grade I diastolic dysfunction (impaired relaxation). Right Ventricle: The right ventricular size is normal. Right vetricular wall thickness was not assessed. Right ventricular systolic function is normal. There is severely elevated pulmonary artery systolic pressure. The tricuspid regurgitant velocity is 3.68 m/s, and with an assumed right atrial pressure of 15 mmHg, the estimated right ventricular systolic pressure is 95.0 mmHg. Left Atrium: Left atrial size was severely dilated. Right Atrium: Right atrial size was normal in size. Pericardium: There is no evidence of pericardial effusion. Mitral Valve: The mitral valve is abnormal. There is mild thickening of the mitral valve leaflet(s). Mild mitral annular calcification. Trivial mitral valve regurgitation. Tricuspid Valve: The tricuspid valve is not well visualized. Tricuspid valve regurgitation is mild. Aortic Valve: The aortic valve was not well visualized. Aortic valve regurgitation is not visualized. No aortic stenosis is present. Pulmonic Valve: The pulmonic valve was not well visualized. Aorta: Aortic root could not be assessed. IAS/Shunts: The interatrial septum was not assessed.   Diastology LV e' medial:    4.46 cm/s LV E/e' medial:  21.7 LV e' lateral:   5.44 cm/s LV E/e' lateral: 17.8  RIGHT VENTRICLE TAPSE (M-mode): 0.8 cm LEFT ATRIUM              Index       RIGHT ATRIUM          Index LA Vol (A2C):   74.0 ml  45.12 ml/m RA Area:     9.51 cm LA Vol (A4C):   119.0 ml 72.55 ml/m RA Volume:   15.20 ml 9.27 ml/m LA Biplane Vol:  97.1 ml  59.20 ml/m  AORTIC VALVE LVOT Vmax:   151.00 cm/s LVOT Vmean:  103.000 cm/s LVOT VTI:    0.267 m MITRAL VALVE                TRICUSPID VALVE MV Area (PHT): 2.36 cm     TR Peak grad:   54.2 mmHg MV Decel Time: 322 msec     TR Vmax:  368.00 cm/s MV E velocity: 96.80 cm/s MV A velocity: 139.00 cm/s  SHUNTS MV E/A ratio:  0.70         Systemic VTI: 0.27 m Dorris Carnes MD Electronically signed by Dorris Carnes MD Signature Date/Time: 06/22/2020/7:11:43 PM    Final    DG HIP OPERATIVE UNILAT W OR W/O PELVIS LEFT  Result Date: 06/20/2020 CLINICAL DATA:  87 year old female with hip fracture EXAM: OPERATIVE LEFT HIP (WITH PELVIS IF PERFORMED)  VIEWS TECHNIQUE: Fluoroscopic spot image(s) were submitted for interpretation post-operatively. COMPARISON:  None. FINDINGS: Multiple intraoperative fluoroscopic spot images. Surgical changes of antegrade intramedullary rod placement for left femoral fracture fixation. Cannulated interlocking screw at the femoral neck with distal interlocking screw of the femur. Alignment relatively restored in the mid femur. There appears to be refraction of the cortex at the fracture site, which is poorly characterized on fluoroscopic images, better characterized on prior CT. IMPRESSION: Limited intraoperative fluoroscopic spot images of ORIF of left femoral pathologic fracture. Alignment relatively restored. Please refer to the dictated operative report for full details of intraoperative findings and procedure. Electronically Signed   By: Corrie Mckusick D.O.   On: 06/20/2020 14:30   DG Hip Unilat W or Wo Pelvis 2-3 Views Left  Result Date: 06/19/2020 CLINICAL DATA:  Fall EXAM: DG HIP (WITH OR WITHOUT PELVIS) 2-3V LEFT; LEFT FEMUR 2 VIEWS COMPARISON:  None. FINDINGS: Evaluation is limited secondary to patient rotation. Osteopenia. There is a foreshortened fracture of the mid femoral shaft. Distal fragment is displaced medially by 1.5 shaft widths. It is foreshortened by  approximately 6 cm. There is apex ventral angulation. There is limited assessment of the LEFT hip joint due to patient rotation. Femoral head appears to be located within the acetabulum. Overlying bowel gas limits evaluation of the sacrum. Degenerative changes of the lower lumbar spine. Vascular calcifications. IMPRESSION: Foreshortened, displaced and angulated fracture of the mid left femoral shaft. If persistent concern for nondisplaced hip or pelvic fracture, recommend dedicated pelvic MRI or CT. Electronically Signed   By: Valentino Saxon MD   On: 06/19/2020 12:45   DG Femur Min 2 Views Left  Result Date: 06/19/2020 CLINICAL DATA:  Fall EXAM: DG HIP (WITH OR WITHOUT PELVIS) 2-3V LEFT; LEFT FEMUR 2 VIEWS COMPARISON:  None. FINDINGS: Evaluation is limited secondary to patient rotation. Osteopenia. There is a foreshortened fracture of the mid femoral shaft. Distal fragment is displaced medially by 1.5 shaft widths. It is foreshortened by approximately 6 cm. There is apex ventral angulation. There is limited assessment of the LEFT hip joint due to patient rotation. Femoral head appears to be located within the acetabulum. Overlying bowel gas limits evaluation of the sacrum. Degenerative changes of the lower lumbar spine. Vascular calcifications. IMPRESSION: Foreshortened, displaced and angulated fracture of the mid left femoral shaft. If persistent concern for nondisplaced hip or pelvic fracture, recommend dedicated pelvic MRI or CT. Electronically Signed   By: Valentino Saxon MD   On: 06/19/2020 12:45   DG FEMUR PORT MIN 2 VIEWS LEFT  Result Date: 06/20/2020 CLINICAL DATA:  87 year old female status post left hip ORIF. EXAM: LEFT FEMUR PORTABLE 2 VIEWS COMPARISON:  None. FINDINGS: Status post ORIF of the left femoral diaphysis fracture. The hardware is intact. The bones are osteopenic. 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 Postsurgical changes of the soft tissues of the left hip and  cutaneous clips. IMPRESSION: Status post ORIF of the left femoral diaphysis fracture. Electronically Signed   By: Milas Hock  Radparvar M.D.   On: 06/20/2020 15:04    Microbiology Recent Results (from the past 240 hour(s))  SARS CORONAVIRUS 2 (TAT 6-24 HRS) Nasopharyngeal Nasopharyngeal Swab     Status: None   Collection Time: 06/19/20 11:55 AM   Specimen: Nasopharyngeal Swab  Result Value Ref Range Status   SARS Coronavirus 2 NEGATIVE NEGATIVE Final    Comment: (NOTE) SARS-CoV-2 target nucleic acids are NOT DETECTED.  The SARS-CoV-2 RNA is generally detectable in upper and lower respiratory specimens during the acute phase of infection. Negative results do not preclude SARS-CoV-2 infection, do not rule out co-infections with other pathogens, and should not be used as the sole basis for treatment or other patient management decisions. Negative results must be combined with clinical observations, patient history, and epidemiological information. The expected result is Negative.  Fact Sheet for Patients: SugarRoll.be  Fact Sheet for Healthcare Providers: https://www.woods-mathews.com/  This test is not yet approved or cleared by the [place] FDA and  has been authorized for detection and/or diagnosis of SARS-CoV-2 by FDA under an Emergency Use Authorization (EUA). This EUA will remain  in effect (meaning this test can be used) for the duration of the COVID-19 declaration under Se ction 564(b)(1) of the Act, 21 U.S.C. section 360bbb-3(b)(1), unless the authorization is terminated or revoked sooner.  Performed at [facility], [place] [address] [place], [place] [address]   MRSA PCR Screening     Status: None   Collection Time: 06/19/20  9:15 PM   Specimen: Nasal Mucosa; Nasopharyngeal  Result Value Ref Range Status   MRSA by PCR NEGATIVE NEGATIVE Final    Comment:        The GeneXpert MRSA Assay (FDA approved for NASAL  specimens only), is one component of a comprehensive MRSA colonization surveillance program. It is not intended to diagnose MRSA infection nor to guide or monitor treatment for MRSA infections. Performed at [facility], [place] [address], [place], [place] [address]   SARS CORONAVIRUS 2 (TAT 6-24 HRS) Nasopharyngeal Nasopharyngeal Swab     Status: None   Collection Time: 06/24/20 12:39 PM   Specimen: Nasopharyngeal Swab  Result Value Ref Range Status   SARS Coronavirus 2 NEGATIVE NEGATIVE Final    Comment: (NOTE) SARS-CoV-2 target nucleic acids are NOT DETECTED.  The SARS-CoV-2 RNA is generally detectable in upper and lower respiratory specimens during the acute phase of infection. Negative results do not preclude SARS-CoV-2 infection, do not rule out co-infections with other pathogens, and should not be used as the sole basis for treatment or other patient management decisions. Negative results must be combined with clinical observations, patient history, and epidemiological information. The expected result is Negative.  Fact Sheet for Patients: SugarRoll.be  Fact Sheet for Healthcare Providers: https://www.woods-mathews.com/  This test is not yet approved or cleared by the [place] FDA and  has been authorized for detection and/or diagnosis of SARS-CoV-2 by FDA under an Emergency Use Authorization (EUA). This EUA will remain  in effect (meaning this test can be used) for the duration of the COVID-19 declaration under Se ction 564(b)(1) of the Act, 21 U.S.C. section 360bbb-3(b)(1), unless the authorization is terminated or revoked sooner.  Performed at [facility], [place] [address] [place], [place] [address]   Resp Panel by RT-PCR (Flu A&B, Covid) Nasopharyngeal Swab     Status: None   Collection Time: 06/28/20 12:25 AM   Specimen: Nasopharyngeal Swab; Nasopharyngeal(NP) swabs in vial transport medium   Result  Value Ref Range Status   SARS Coronavirus 2 by RT PCR NEGATIVE NEGATIVE Final    Comment: (NOTE) SARS-CoV-2 target nucleic acids are NOT DETECTED.  The SARS-CoV-2 RNA is generally detectable in upper respiratory specimens during the acute phase of infection. The lowest concentration of SARS-CoV-2 viral copies this assay can detect is 138 copies/mL. A negative result does not preclude SARS-Cov-2 infection and should not be used as the sole basis for treatment or other patient management decisions. A negative result may occur with  improper specimen collection/handling, submission of specimen other than nasopharyngeal swab, presence of viral mutation(s) within the areas targeted by this assay, and inadequate number of viral copies(<138 copies/mL). A negative result must be combined with clinical observations, patient history, and epidemiological information. The expected result is Negative.  Fact Sheet for Patients:  EntrepreneurPulse.com.au  Fact Sheet for Healthcare Providers:  IncredibleEmployment.be  This test is no t yet approved or cleared by the [place] FDA and  has been authorized for detection and/or diagnosis of SARS-CoV-2 by FDA under an Emergency Use Authorization (EUA). This EUA will remain  in effect (meaning this test can be used) for the duration of the COVID-19 declaration under Section 564(b)(1) of the Act, 21 U.S.C.section 360bbb-3(b)(1), unless the authorization is terminated  or revoked sooner.       Influenza A by PCR NEGATIVE NEGATIVE Final   Influenza B by PCR NEGATIVE NEGATIVE Final    Comment: (NOTE) The Xpert Xpress SARS-CoV-2/FLU/RSV plus assay is intended as an aid in the diagnosis of influenza from Nasopharyngeal swab specimens and should not be used as a sole basis for treatment. Nasal washings and aspirates are unacceptable for Xpert Xpress SARS-CoV-2/FLU/RSV testing.  Fact Sheet for  Patients: EntrepreneurPulse.com.au  Fact Sheet for Healthcare Providers: IncredibleEmployment.be  This test is not yet approved or cleared by the [place] FDA and has been authorized for detection and/or diagnosis of SARS-CoV-2 by FDA under an Emergency Use Authorization (EUA). This EUA will remain in effect (meaning this test can be used) for the duration of the COVID-19 declaration under Section 564(b)(1) of the Act, 21 U.S.C. section 360bbb-3(b)(1), unless the authorization is terminated or revoked.  Performed at [facility], [place] [address], [place], [place] [address]   Urine culture     Status: Abnormal   Collection Time: 06/28/20  4:50 AM   Specimen: In/Out Cath Urine  Result Value Ref Range Status   Specimen Description IN/OUT CATH URINE  Final   Special Requests   Final    NONE Performed at [facility], [place] [address] [place], [place] [address]    Culture >=100,000 COLONIES/mL YEAST (A)  Final   Report Status 2020/07/27 FINAL  Final    Lab Basic Metabolic Panel: Recent Labs  Lab 06/23/20 0513 06/24/20 0510 06/25/20 0433 06/22/2020 2329  NA 141 139 142 141  K 4.1 3.8 4.2 4.4  CL 110 108 109 111  CO2 23 21* 26 19*  GLUCOSE 132* 101* 117* 129*  BUN 34* 28* 28* 29*  CREATININE 0.92 0.72 0.55 1.25*  CALCIUM 8.6* 8.8* 8.9 7.9*  MG 2.0 2.0  --   --   PHOS 2.6 1.8*  --   --    Liver Function Tests: Recent Labs  Lab 06/23/20 0513 06/24/20 0510 06/25/20 0433 06/21/2020 2329  AST  --   --  19 79*  ALT  --   --  6 34  ALKPHOS  --   --  147* 312*  BILITOT  --   --  0.8 1.4*  PROT  --   --  5.2* 4.6*  ALBUMIN 2.7* 2.6* 2.5* 2.2*   No results for input(s): LIPASE, AMYLASE in the last 168 hours. No results for input(s): AMMONIA in the last 168 hours. CBC: Recent Labs  Lab 06/23/20 0513 06/24/20 0510 06/25/20 0433 06/28/20 0435  WBC 8.8  --  11.3* 14.9*  NEUTROABS  --   --   --  11.6*  HGB 7.8* 8.3*  8.3* 8.2*  HCT 24.6* 26.1* 26.0* 25.6*  MCV 99.6  --  99.2 104.5*  PLT 162  --  195 113*   Cardiac Enzymes: No results for input(s): CKTOTAL, CKMB, CKMBINDEX, TROPONINI in the last 168 hours. Sepsis Labs: Recent Labs  Lab 06/23/20 0513 06/25/20 0433 06/18/2020 2329 06/13/2020 2357 06/28/20 0435 06/28/20 0825  PROCALCITON  --   --  0.45  --   --   --   WBC 8.8 11.3*  --   --  14.9*  --   LATICACIDVEN  --   --   --  3.2* 3.1* 2.4*    Procedures/Operations     Ebany Bowermaster Jul 29, 2020, 10:28 AM

## 2020-06-30 NOTE — Progress Notes (Signed)
Pt was comfort care, son came in this morning and called this RN in the room to check on her, when this RN came to room pt was unresponsive & not breathing, this RN and Harrietta Guardian, RN pronounced pt's death at 8:50 am- Dr. Pietro Cassis was notified. Son Josph Macho at bedside, he called his brother [name] in and he provided funeral home information. Post mortem care completed and pt transported to [place].

## 2020-06-30 NOTE — Plan of Care (Signed)

## 2020-06-30 DEATH — deceased

## 2020-07-03 LAB — CULTURE, BLOOD (ROUTINE X 2): Culture: NO GROWTH

## 2020-07-03 LAB — CULTURE, BLOOD (SINGLE): Culture: NO GROWTH

## 2020-07-08 ENCOUNTER — Inpatient Hospital Stay: Payer: Medicare Other | Admitting: Hematology
